# Patient Record
Sex: Male | Born: 1985
Health system: Southern US, Community
[De-identification: ages and names within clinical notes are randomized; demographics above are authoritative.]

## PROBLEM LIST (undated history)

## (undated) DIAGNOSIS — F32A Depression, unspecified: Secondary | ICD-10-CM

## (undated) DIAGNOSIS — F1910 Other psychoactive substance abuse, uncomplicated: Secondary | ICD-10-CM

## (undated) DIAGNOSIS — J45909 Unspecified asthma, uncomplicated: Secondary | ICD-10-CM

## (undated) DIAGNOSIS — Z21 Asymptomatic human immunodeficiency virus [HIV] infection status: Secondary | ICD-10-CM

## (undated) DIAGNOSIS — F988 Other specified behavioral and emotional disorders with onset usually occurring in childhood and adolescence: Secondary | ICD-10-CM

## (undated) DIAGNOSIS — F419 Anxiety disorder, unspecified: Secondary | ICD-10-CM

## (undated) DIAGNOSIS — T50902A Poisoning by unspecified drugs, medicaments and biological substances, intentional self-harm, initial encounter: Secondary | ICD-10-CM

## (undated) DIAGNOSIS — R569 Unspecified convulsions: Secondary | ICD-10-CM

## (undated) DIAGNOSIS — F329 Major depressive disorder, single episode, unspecified: Secondary | ICD-10-CM

## (undated) DIAGNOSIS — G4700 Insomnia, unspecified: Secondary | ICD-10-CM

## (undated) DIAGNOSIS — B029 Zoster without complications: Secondary | ICD-10-CM

## (undated) DIAGNOSIS — F319 Bipolar disorder, unspecified: Secondary | ICD-10-CM

## (undated) DIAGNOSIS — B20 Human immunodeficiency virus [HIV] disease: Secondary | ICD-10-CM

## (undated) DIAGNOSIS — I10 Essential (primary) hypertension: Secondary | ICD-10-CM

## (undated) DIAGNOSIS — F209 Schizophrenia, unspecified: Secondary | ICD-10-CM

## (undated) HISTORY — PX: DENTAL SURGERY: SHX609

---
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
## 2004-01-17 ENCOUNTER — Ambulatory Visit (HOSPITAL_COMMUNITY): Admission: RE | Admit: 2004-01-17 | Discharge: 2004-01-17 | Payer: Self-pay | Admitting: Internal Medicine

## 2004-06-06 ENCOUNTER — Emergency Department (HOSPITAL_COMMUNITY): Admission: EM | Admit: 2004-06-06 | Discharge: 2004-06-06 | Payer: Self-pay | Admitting: Emergency Medicine

## 2004-07-27 ENCOUNTER — Emergency Department (HOSPITAL_COMMUNITY): Admission: EM | Admit: 2004-07-27 | Discharge: 2004-07-27 | Payer: Self-pay | Admitting: Emergency Medicine

## 2005-09-07 ENCOUNTER — Emergency Department (HOSPITAL_COMMUNITY): Admission: EM | Admit: 2005-09-07 | Discharge: 2005-09-07 | Payer: Self-pay | Admitting: Emergency Medicine

## 2005-12-11 ENCOUNTER — Emergency Department (HOSPITAL_COMMUNITY): Admission: EM | Admit: 2005-12-11 | Discharge: 2005-12-11 | Payer: Self-pay | Admitting: Emergency Medicine

## 2006-01-26 ENCOUNTER — Emergency Department (HOSPITAL_COMMUNITY): Admission: EM | Admit: 2006-01-26 | Discharge: 2006-01-26 | Payer: Self-pay | Admitting: Emergency Medicine

## 2006-03-23 ENCOUNTER — Emergency Department: Payer: Self-pay | Admitting: Emergency Medicine

## 2006-03-23 ENCOUNTER — Other Ambulatory Visit: Payer: Self-pay

## 2006-05-07 ENCOUNTER — Emergency Department (HOSPITAL_COMMUNITY): Admission: EM | Admit: 2006-05-07 | Discharge: 2006-05-07 | Payer: Self-pay | Admitting: Emergency Medicine

## 2006-07-05 ENCOUNTER — Emergency Department (HOSPITAL_COMMUNITY): Admission: EM | Admit: 2006-07-05 | Discharge: 2006-07-06 | Payer: Self-pay | Admitting: Emergency Medicine

## 2006-07-26 ENCOUNTER — Emergency Department (HOSPITAL_COMMUNITY): Admission: EM | Admit: 2006-07-26 | Discharge: 2006-07-27 | Payer: Self-pay | Admitting: Emergency Medicine

## 2006-12-05 ENCOUNTER — Emergency Department: Payer: Self-pay | Admitting: Emergency Medicine

## 2006-12-21 ENCOUNTER — Emergency Department: Payer: Self-pay | Admitting: Emergency Medicine

## 2007-03-02 ENCOUNTER — Emergency Department (HOSPITAL_COMMUNITY): Admission: EM | Admit: 2007-03-02 | Discharge: 2007-03-02 | Payer: Self-pay | Admitting: Emergency Medicine

## 2007-03-31 ENCOUNTER — Emergency Department (HOSPITAL_COMMUNITY): Admission: EM | Admit: 2007-03-31 | Discharge: 2007-03-31 | Payer: Self-pay | Admitting: Emergency Medicine

## 2007-04-19 ENCOUNTER — Emergency Department (HOSPITAL_COMMUNITY): Admission: EM | Admit: 2007-04-19 | Discharge: 2007-04-19 | Payer: Self-pay | Admitting: Emergency Medicine

## 2007-04-19 ENCOUNTER — Inpatient Hospital Stay (HOSPITAL_COMMUNITY): Admission: AD | Admit: 2007-04-19 | Discharge: 2007-04-28 | Payer: Self-pay | Admitting: Psychiatry

## 2007-04-19 ENCOUNTER — Ambulatory Visit: Payer: Self-pay | Admitting: Psychiatry

## 2007-05-05 ENCOUNTER — Ambulatory Visit: Payer: Self-pay | Admitting: Internal Medicine

## 2007-05-05 ENCOUNTER — Encounter: Admission: RE | Admit: 2007-05-05 | Discharge: 2007-05-05 | Payer: Self-pay | Admitting: Internal Medicine

## 2007-05-05 DIAGNOSIS — B20 Human immunodeficiency virus [HIV] disease: Secondary | ICD-10-CM

## 2007-05-05 LAB — CONVERTED CEMR LAB
ALT: 25 units/L (ref 0–53)
AST: 23 units/L (ref 0–37)
Albumin: 4.4 g/dL (ref 3.5–5.2)
Alkaline Phosphatase: 64 units/L (ref 39–117)
BUN: 10 mg/dL (ref 6–23)
Basophils Absolute: 0.1 10*3/uL (ref 0.0–0.1)
Basophils Relative: 1 % (ref 0–1)
Bilirubin Urine: NEGATIVE
CD4 T Cell Abs: 660
CO2: 26 meq/L (ref 19–32)
Calcium: 9.4 mg/dL (ref 8.4–10.5)
Chlamydia, Swab/Urine, PCR: NEGATIVE
Chloride: 107 meq/L (ref 96–112)
Creatinine, Ser: 0.89 mg/dL (ref 0.40–1.50)
Eosinophils Absolute: 0.7 10*3/uL (ref 0.0–0.7)
Eosinophils Relative: 10 % — ABNORMAL HIGH (ref 0–5)
GC Probe Amp, Urine: NEGATIVE
Glucose, Bld: 86 mg/dL (ref 70–99)
HCT: 44.3 % (ref 39.0–52.0)
HCV Ab: NEGATIVE
HIV 1 RNA Quant: 40900 copies/mL
HIV 1 RNA Quant: 56000 copies/mL — ABNORMAL HIGH (ref <50)
HIV-1 RNA Quant, Log: 4.75 — ABNORMAL HIGH (ref <1.70)
HIV-1 antibody: POSITIVE — AB
HIV-2 Ab: UNDETERMINED — AB
HIV: REACTIVE
Hemoglobin, Urine: NEGATIVE
Hemoglobin: 14.7 g/dL (ref 13.0–17.0)
Hep A Total Ab: NEGATIVE
Hep B S Ab: NEGATIVE
Hepatitis B Surface Ag: NEGATIVE
Ketones, ur: NEGATIVE mg/dL
Leukocytes, UA: NEGATIVE
Lymphocytes Relative: 33 % (ref 12–46)
Lymphs Abs: 2.2 10*3/uL (ref 0.7–3.3)
MCHC: 33.2 g/dL (ref 30.0–36.0)
MCV: 85.9 fL (ref 78.0–100.0)
Monocytes Absolute: 0.6 10*3/uL (ref 0.2–0.7)
Monocytes Relative: 9 % (ref 3–11)
Neutro Abs: 3 10*3/uL (ref 1.7–7.7)
Neutrophils Relative %: 47 % (ref 43–77)
Nitrite: NEGATIVE
Platelets: 237 10*3/uL (ref 150–400)
Potassium: 4.5 meq/L (ref 3.5–5.3)
Protein, ur: NEGATIVE mg/dL
RBC: 5.16 M/uL (ref 4.22–5.81)
RDW: 13.7 % (ref 11.5–14.0)
Sodium: 142 meq/L (ref 135–145)
Specific Gravity, Urine: 1.026 (ref 1.005–1.03)
Total Bilirubin: 0.5 mg/dL (ref 0.3–1.2)
Total Protein: 7.1 g/dL (ref 6.0–8.3)
Urine Glucose: NEGATIVE mg/dL
Urobilinogen, UA: 0.2 (ref 0.0–1.0)
WBC: 6.5 10*3/uL (ref 4.0–10.5)
pH: 7.5 (ref 5.0–8.0)

## 2007-05-06 ENCOUNTER — Encounter: Payer: Self-pay | Admitting: Internal Medicine

## 2007-05-10 ENCOUNTER — Encounter: Payer: Self-pay | Admitting: Internal Medicine

## 2007-05-20 ENCOUNTER — Ambulatory Visit: Payer: Self-pay | Admitting: Internal Medicine

## 2007-05-20 DIAGNOSIS — J45909 Unspecified asthma, uncomplicated: Secondary | ICD-10-CM | POA: Insufficient documentation

## 2007-05-23 ENCOUNTER — Encounter (INDEPENDENT_AMBULATORY_CARE_PROVIDER_SITE_OTHER): Payer: Self-pay | Admitting: *Deleted

## 2007-05-25 ENCOUNTER — Encounter: Payer: Self-pay | Admitting: Internal Medicine

## 2007-09-01 ENCOUNTER — Encounter (INDEPENDENT_AMBULATORY_CARE_PROVIDER_SITE_OTHER): Payer: Self-pay | Admitting: *Deleted

## 2007-09-19 ENCOUNTER — Encounter: Payer: Self-pay | Admitting: Internal Medicine

## 2007-09-28 ENCOUNTER — Encounter (INDEPENDENT_AMBULATORY_CARE_PROVIDER_SITE_OTHER): Payer: Self-pay | Admitting: *Deleted

## 2008-05-21 ENCOUNTER — Emergency Department (HOSPITAL_COMMUNITY): Admission: EM | Admit: 2008-05-21 | Discharge: 2008-05-21 | Payer: Self-pay | Admitting: Emergency Medicine

## 2011-02-10 NOTE — Discharge Summary (Signed)
Edwin Martinez, Edwin Martinez              ACCOUNT NO.:  [id number]   MEDICAL RECORD NO.:  [id number]          PATIENT TYPE:  IPS   LOCATION:  0506                          FACILITY:  [facility]   PHYSICIAN:  Geoffery Lyons, M.D.      DATE OF BIRTH:  1986-01-11   DATE OF ADMISSION:  04/19/2007  DATE OF DISCHARGE:  04/28/2007                               DISCHARGE SUMMARY   CHIEF COMPLAINT AND PRESENT ILLNESS:  This was the second admission to  [facility] for this 25 year old single African-  American male voluntarily admitted.  History of depression, suicide  ideas, wanted to hang himself, positive for voices.  He cut his left arm  with a razor.  Drinking socially.  Stressors include he is homeless and  issues in relationship.   PAST PSYCHIATRIC HISTORY:  Second time at [facility].  No current  outpatient treatment.  Had been previously diagnosed as bipolar and  ADHD.   ALCOHOL/DRUG HISTORY:  Denies active use of any substances.   MEDICAL HISTORY:  Asthma.   MEDICATIONS:  Had been on Seroquel which was effective, none for a year.   PHYSICAL EXAMINATION:  Performed and failed to show any acute findings.   LABORATORY DATA:  SGOT 23, SGPT 22, total bilirubin 0.7.   MENTAL STATUS EXAM:  Fully alert, cooperative male, good eye contact.  Speech clear, normal rate, tempo and production, articulate.  Mood  depressed, sad, hopeless.  Affect depressed.  Thought processes logical,  coherent and relevant.  Endorsed voices that talk to him.  Endorsed no  active suicidal or homicidal ideation.  There is no evidence of  delusions.  No hallucinations.  Cognition well-preserved.   ADMISSION DIAGNOSES:  AXIS I:  Major depression with psychotic features.  Marijuana abuse.  AXIS II:  No diagnosis.  AXIS III:  Asthma.  AXIS IV:  Moderate.  AXIS V:  GAF upon admission 35; highest GAF in the last year 60.   HOSPITAL COURSE:  He was admitted.  He was started in individual and  group  psychotherapy.  He endorsed that he freaked out, started hearing a  voice telling him to hurt himself, got a knife and cut self on the arm.  Mother called the police who took him to the ED and then he was referred  to [facility].  Mood depressed for two or three days, suicidal  ideas, hallucinations telling him to hurt himself.  Was taking his  mother's Seroquel to help him calm down.  No ongoing treatment.  He  indeed had some superficial scratches on left outer aspect of his arm.  No medications since 2005.  He had been on Advair, Seroquel 300 mg twice  a day, Prevacid 30 mg per day.  When he turned 20, he stopped his  medications secondary to no money.  Previous admissions to [facility]  adolescent for attempting to hang himself.  He was living with older  male who is his friend who has a 55 year old son.  They apparently  smoke marijuana together.  He was kicked out of the house for talking  about marijuana with her son.  Previous HIV testing turned out to be  positive.  He initially did not know.  Endorsed that he became suicidal  after the friend put him out of the house.  No transportation,  unemployed.  We went ahead and notified him about his HIV test being  positive.  He got very upset and he actually fainted when he heard the  news.  Endorsed hearing a voice telling him to do things like hurting  himself.  Became very upset, very tearful, talking about telling his  mother about being HIV positive was catastrophizing.  Endorsed I'm  going to die.  It's all over.  We provided one-on-one observation to  try to help him process the news.  As the day progressed, he was able to  endorse that he was not feeling suicidal anymore.  He became more  proactive, trying to find out more about the HIV and the treatment and  the testing.  On April 22, 2007, he was able to contract for safety.  Able to come off one-on-one.  Was able to quit catastrophizing.  He was  going to stay with his  cousin.  Endorsed he was feeling much better and  affect was brighter.  Endorsed that he hears the voices more so when he  is depressed.  The next couple of days, we pursued more evaluation for  hepatitis, RPR and all these tests were negative.  He was referred to  [facility] and we made the necessary arrangements for a  medical follow-up.  The cousin that was going to allow him a place to  stay eventually changed his mind, so we start working on assisted-living  facility.  The sister was also offering a place but that again did not  materialize.  Did endorse once he started thinking about his HIV  situation, he was getting more anxious but endorsed that he was not  feeling suicidal.  We worked with the Seroquel.  We worked with Materials engineer.  He was accepted to an assisted-living facility.  He was very  excited about it.  He was going to go to the [facility].  Was going to follow up with [facility].  He had an ear skin  lesion that was being treated with doxycycline.  So it was felt that he  had obtained full benefit from the hospitalization for what we went  ahead and discharged to outpatient follow-up.   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  Marijuana abuse.  AXIS II:  No diagnosis.  AXIS III:  HIV-positive, asthma.  AXIS IV:  Moderate.  AXIS V:  GAF upon discharge 50.   DISCHARGE MEDICATIONS:  1. Seroquel 200 mg at bedtime and 100 mg twice a day.  2. Naproxen 500 mg, 1 twice a day as needed.  3. Doxycycline 100 mg, 1 at 6 p.m.   FOLLOWUP:  Outpatient clinic and Dr. Tresa Endo __________ and the [facility].      Geoffery Lyons, M.D.  Electronically Signed     IL/MEDQ  D:  05/10/2007  T:  05/11/2007  Job:  [id number]
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
## 2011-02-13 NOTE — Discharge Summary (Signed)
NAME:  Edwin Martinez, Edwin Martinez                        ACCOUNT NO.:  [id number]   MEDICAL RECORD NO.:  [id number]                   PATIENT TYPE:  INP   LOCATION:  0201                                 FACILITY:  [facility]   PHYSICIAN:  Cindie Crumbly, M.D.               DATE OF BIRTH:  10-11-1985   DATE OF ADMISSION:  10/07/2002  DATE OF DISCHARGE:  10/10/2002                                 DISCHARGE SUMMARY   REASON FOR ADMISSION:  This 25 year old white male was admitted for  inpatient psychiatric stabilization because of increasing suicidal ideation  with increasingly deep cuts made upon his wrists.  For further history of  present illness, please see the patient's psychiatric admission assessment.   PHYSICAL EXAMINATION:  At the time of admission was significant for a  history of asthma.  He otherwise displayed superficial self-inflicted  lacerations of his left forearm.  Physical examination was otherwise  negative.   LABORATORY DATA:  The patient underwent a laboratory workup to rule out any  other medical problems contributing to his symptomatology.  Urine probe for  gonorrhea and chlamydia was negative.  RPR was nonreactive.  UA was  unremarkable.  GGT was within normal limits.  Hepatic panel was within  normal limits.  Free T4 and TSH were within normal limits.  All further  laboratory evaluations were performed at [facility] prior to the  patient being transferred to this facility for a more definitive  stabilization.   HOSPITAL COURSE:  On admission, the patient was psychomotor agitated, showed  poor impulse control, decreased concentration.  His affect and mood were  depressed.  He was intrusive and psychomotor agitated.  He was begun on a  trial of Celexa at 20 mg per day and tolerated this medication well without  side effects.  At the time of discharge, he denies any suicidal or homicidal  ideation.  His affect and mood have improved.  His concentration has  increased.  He no longer appears to be a danger to himself or others and,  consequently, is felt to have reached his maximum benefits of  hospitalization and is ready for discharge to a less restrictive alternative  setting.   CONDITION ON DISCHARGE:  Improved.   DIAGNOSES (ACCORDING TO DSM-IV):   AXIS I:  1. Major depression, recurrent, severe, without psychosis.  2. Rule out substance-induced mood disorder.  3. Polysubstance dependence.  4. 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  5. Oppositional defiant disorder.   AXIS II:  1. Rule out learning disorder not otherwise specified.  2. Rule out personality disorder not otherwise specified.   AXIS III:  Asthma.   AXIS IV:  Severe.   AXIS V:  20 on admission; 30 on discharge.   FURTHER EVALUATION AND TREATMENT RECOMMENDATIONS:  1. The patient is discharged to home.  2. He is discharged on an unrestricted level of activity and a regular diet.  3. He is  discharged on Remeron 30 mg p.o. q.h.s.  4. He will follow up with his outpatient psychiatrist at [facility] for all further aspects of his psychiatric care and,     consequently, I will sign off on the case at this time.  He will follow     up with his primary care physician for all further aspects of his medical     care.                                               Cindie Crumbly, M.D.    TS/MEDQ  D:  10/10/2002  T:  10/10/2002  Job:  [id number]

## 2011-03-25 LAB — HIV RNA, QUANTITATIVE, PCR: HIV 1 RNA Quant: <20

## 2011-03-25 LAB — T-HELPER CELL (CD4) - (RCID CLINIC ONLY): CD4 absolute: 1421

## 2011-07-13 LAB — T-HELPER CELL (CD4) - (RCID CLINIC ONLY)
CD4 % Helper T Cell: 27 — ABNORMAL LOW
CD4 T Cell Abs: 520

## 2011-07-13 LAB — ETHANOL: Alcohol, Ethyl (B): <5

## 2011-07-13 LAB — CBC
HCT: 42.1
Hemoglobin: 14.2
MCHC: 33.8
MCV: 85
Platelets: 228
RBC: 4.95
RDW: 14.1 — ABNORMAL HIGH
WBC: 7.7

## 2011-07-13 LAB — RAPID URINE DRUG SCREEN, HOSP PERFORMED
Amphetamines: NOT DETECTED
Barbiturates: NOT DETECTED
Benzodiazepines: NOT DETECTED
Cocaine: NOT DETECTED
Opiates: NOT DETECTED
Tetrahydrocannabinol: POSITIVE — AB

## 2011-07-13 LAB — HEPATIC FUNCTION PANEL
ALT: 22
AST: 23
Albumin: 3.9
Alkaline Phosphatase: 75
Bilirubin, Direct: <0.1
Total Bilirubin: 0.7
Total Protein: 7

## 2011-07-13 LAB — BASIC METABOLIC PANEL
BUN: 10
CO2: 25
Calcium: 9.4
Chloride: 107
Creatinine, Ser: 0.87
GFR calc Af Amer: >60
GFR calc non Af Amer: >60
Glucose, Bld: 100 — ABNORMAL HIGH
Potassium: 3.9
Sodium: 139

## 2011-07-13 LAB — EPSTEIN-BARR VIRUS VCA, IGM: EBV VCA IgM: 0.17

## 2011-07-13 LAB — HIV-1 GENOTYPR PLUS

## 2011-07-13 LAB — T-HELPER CELLS (CD4) COUNT (NOT AT ARMC): CD4 % Helper T Cell: 28 — ABNORMAL LOW

## 2011-07-13 LAB — CMV IGM: CMV IgM: <0.9 Index (ref <0.90)

## 2011-07-13 LAB — DIFFERENTIAL
Basophils Absolute: 0.1
Basophils Relative: 1
Eosinophils Absolute: 0.4
Eosinophils Relative: 5
Lymphocytes Relative: 32
Lymphs Abs: 2.4
Monocytes Absolute: 0.8 — ABNORMAL HIGH
Monocytes Relative: 10
Neutro Abs: 4
Neutrophils Relative %: 52

## 2011-07-13 LAB — HEPATITIS B SURFACE ANTIGEN: Hepatitis B Surface Ag: NEGATIVE

## 2011-07-13 LAB — HEPATITIS C ANTIBODY: HCV Ab: NEGATIVE

## 2011-07-13 LAB — EPSTEIN-BARR VIRUS VCA, IGG: EBV VCA IgG: 4.69 — ABNORMAL HIGH

## 2011-07-13 LAB — HEPATITIS B SURFACE ANTIBODY,QUALITATIVE: Hep B S Ab: NEGATIVE

## 2011-07-13 LAB — HEPATITIS B CORE ANTIBODY, TOTAL: Hep B Core Total Ab: NEGATIVE

## 2011-07-13 LAB — RPR: RPR Ser Ql: NONREACTIVE

## 2011-07-13 LAB — HIV-1 RNA ULTRAQUANT REFLEX TO GENTYP+
HIV 1 RNA Quant: 40900 copies/mL — ABNORMAL HIGH (ref <50)
HIV-1 RNA Quant, Log: 4.61 — ABNORMAL HIGH (ref <1.70)

## 2011-07-14 LAB — HIV ANTIBODY (ROUTINE TESTING W REFLEX): HIV: REACTIVE — AB

## 2012-09-06 ENCOUNTER — Ambulatory Visit (INDEPENDENT_AMBULATORY_CARE_PROVIDER_SITE_OTHER): Payer: Self-pay

## 2012-09-06 ENCOUNTER — Ambulatory Visit: Payer: Self-pay

## 2012-09-06 ENCOUNTER — Other Ambulatory Visit: Payer: Self-pay | Admitting: Internal Medicine

## 2012-09-06 DIAGNOSIS — B20 Human immunodeficiency virus [HIV] disease: Secondary | ICD-10-CM

## 2012-09-06 DIAGNOSIS — Z23 Encounter for immunization: Secondary | ICD-10-CM

## 2012-09-06 DIAGNOSIS — F909 Attention-deficit hyperactivity disorder, unspecified type: Secondary | ICD-10-CM

## 2012-09-06 DIAGNOSIS — Z113 Encounter for screening for infections with a predominantly sexual mode of transmission: Secondary | ICD-10-CM

## 2012-09-06 DIAGNOSIS — Z79899 Other long term (current) drug therapy: Secondary | ICD-10-CM

## 2012-09-06 DIAGNOSIS — Z111 Encounter for screening for respiratory tuberculosis: Secondary | ICD-10-CM

## 2012-09-06 LAB — COMPLETE METABOLIC PANEL WITH GFR
AST: 15 U/L (ref 0–37)
BUN: 8 mg/dL (ref 6–23)
Calcium: 8.9 mg/dL (ref 8.4–10.5)
Chloride: 107 mEq/L (ref 96–112)
Creat: 0.97 mg/dL (ref 0.50–1.35)
Total Bilirubin: 0.4 mg/dL (ref 0.3–1.2)

## 2012-09-06 LAB — CBC WITH DIFFERENTIAL/PLATELET
Basophils Absolute: 0 10*3/uL (ref 0.0–0.1)
Basophils Relative: 0 % (ref 0–1)
Eosinophils Absolute: 0.3 10*3/uL (ref 0.0–0.7)
Eosinophils Relative: 3 % (ref 0–5)
HCT: 43.2 % (ref 39.0–52.0)
Lymphocytes Relative: 24 % (ref 12–46)
MCH: 28.6 pg (ref 26.0–34.0)
MCHC: 34.3 g/dL (ref 30.0–36.0)
MCV: 83.6 fL (ref 78.0–100.0)
Monocytes Absolute: 0.5 10*3/uL (ref 0.1–1.0)
Platelets: 323 10*3/uL (ref 150–400)
RDW: 13.8 % (ref 11.5–15.5)

## 2012-09-06 LAB — LIPID PANEL
HDL: 46 mg/dL (ref >39)
LDL Cholesterol: 70 mg/dL (ref 0–99)
Triglycerides: 69 mg/dL (ref <150)
VLDL: 14 mg/dL (ref 0–40)

## 2012-09-06 LAB — HEPATITIS C ANTIBODY: HCV Ab: NEGATIVE

## 2012-09-06 LAB — HEPATITIS B SURFACE ANTIGEN: Hepatitis B Surface Ag: NEGATIVE

## 2012-09-06 LAB — RPR

## 2012-09-06 LAB — HEPATITIS B SURFACE ANTIBODY,QUALITATIVE: Hep B S Ab: REACTIVE — AB

## 2012-09-07 LAB — URINALYSIS
Bilirubin Urine: NEGATIVE
Ketones, ur: NEGATIVE mg/dL
Specific Gravity, Urine: 1.019 (ref 1.005–1.030)
Urobilinogen, UA: 1 mg/dL (ref 0.0–1.0)
pH: 6 (ref 5.0–8.0)

## 2012-09-13 LAB — HIV-1 GENOTYPR PLUS

## 2012-09-14 NOTE — Progress Notes (Signed)
Pt has been off HIV medications for at least one year.  He was taking med daily during incarceration for 16 months starting 2011.

## 2012-09-23 ENCOUNTER — Encounter (HOSPITAL_COMMUNITY): Payer: Self-pay | Admitting: *Deleted

## 2012-09-23 ENCOUNTER — Emergency Department (HOSPITAL_COMMUNITY): Payer: Self-pay

## 2012-09-23 ENCOUNTER — Emergency Department (HOSPITAL_COMMUNITY)
Admission: EM | Admit: 2012-09-23 | Discharge: 2012-09-23 | Disposition: A | Discharge status: Home / Self Care | Payer: Self-pay | Attending: Emergency Medicine | Admitting: Emergency Medicine

## 2012-09-23 DIAGNOSIS — F17200 Nicotine dependence, unspecified, uncomplicated: Secondary | ICD-10-CM | POA: Insufficient documentation

## 2012-09-23 DIAGNOSIS — S0083XA Contusion of other part of head, initial encounter: Secondary | ICD-10-CM

## 2012-09-23 DIAGNOSIS — S21209A Unspecified open wound of unspecified back wall of thorax without penetration into thoracic cavity, initial encounter: Secondary | ICD-10-CM | POA: Insufficient documentation

## 2012-09-23 DIAGNOSIS — S1093XA Contusion of unspecified part of neck, initial encounter: Secondary | ICD-10-CM | POA: Insufficient documentation

## 2012-09-23 DIAGNOSIS — F319 Bipolar disorder, unspecified: Secondary | ICD-10-CM | POA: Insufficient documentation

## 2012-09-23 DIAGNOSIS — W503XXA Accidental bite by another person, initial encounter: Secondary | ICD-10-CM

## 2012-09-23 DIAGNOSIS — F1910 Other psychoactive substance abuse, uncomplicated: Secondary | ICD-10-CM | POA: Insufficient documentation

## 2012-09-23 DIAGNOSIS — S0003XA Contusion of scalp, initial encounter: Secondary | ICD-10-CM | POA: Insufficient documentation

## 2012-09-23 DIAGNOSIS — Z8659 Personal history of other mental and behavioral disorders: Secondary | ICD-10-CM | POA: Insufficient documentation

## 2012-09-23 DIAGNOSIS — B20 Human immunodeficiency virus [HIV] disease: Secondary | ICD-10-CM | POA: Insufficient documentation

## 2012-09-23 DIAGNOSIS — IMO0002 Reserved for concepts with insufficient information to code with codable children: Secondary | ICD-10-CM | POA: Insufficient documentation

## 2012-09-23 HISTORY — DX: Schizophrenia, unspecified: F20.9

## 2012-09-23 HISTORY — DX: Asymptomatic human immunodeficiency virus (hiv) infection status: Z21

## 2012-09-23 HISTORY — DX: Human immunodeficiency virus (HIV) disease: B20

## 2012-09-23 HISTORY — DX: Bipolar disorder, unspecified: F31.9

## 2012-09-23 LAB — ETHANOL: Alcohol, Ethyl (B): 108 mg/dL — ABNORMAL HIGH (ref 0–11)

## 2012-09-23 LAB — RAPID URINE DRUG SCREEN, HOSP PERFORMED: Opiates: NOT DETECTED

## 2012-09-23 MED ORDER — AMOXICILLIN-POT CLAVULANATE 500-125 MG PO TABS: 1.0000 | ORAL_TABLET | Freq: Three times a day (TID) | ORAL | Status: DC

## 2012-09-23 NOTE — ED Notes (Addendum)
[facility] aware and in ED at this time
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
## 2012-09-23 NOTE — ED Notes (Signed)
Pt awoken to give [place] a urine sample. Pt able to. Went back to sleep after.
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
## 2012-09-29 ENCOUNTER — Ambulatory Visit: Payer: Self-pay

## 2012-09-29 ENCOUNTER — Ambulatory Visit: Payer: Self-pay | Admitting: Internal Medicine

## 2013-01-02 ENCOUNTER — Encounter (HOSPITAL_COMMUNITY): Payer: Self-pay | Admitting: *Deleted

## 2013-01-02 ENCOUNTER — Emergency Department (HOSPITAL_COMMUNITY)
Admission: EM | Admit: 2013-01-02 | Discharge: 2013-01-02 | Disposition: A | Discharge status: Home / Self Care | Payer: Self-pay | Attending: Emergency Medicine | Admitting: Emergency Medicine

## 2013-01-02 DIAGNOSIS — F17200 Nicotine dependence, unspecified, uncomplicated: Secondary | ICD-10-CM | POA: Insufficient documentation

## 2013-01-02 DIAGNOSIS — Z79899 Other long term (current) drug therapy: Secondary | ICD-10-CM | POA: Insufficient documentation

## 2013-01-02 DIAGNOSIS — S01501A Unspecified open wound of lip, initial encounter: Secondary | ICD-10-CM | POA: Insufficient documentation

## 2013-01-02 DIAGNOSIS — F319 Bipolar disorder, unspecified: Secondary | ICD-10-CM | POA: Insufficient documentation

## 2013-01-02 DIAGNOSIS — F209 Schizophrenia, unspecified: Secondary | ICD-10-CM | POA: Insufficient documentation

## 2013-01-02 DIAGNOSIS — Z21 Asymptomatic human immunodeficiency virus [HIV] infection status: Secondary | ICD-10-CM | POA: Insufficient documentation

## 2013-01-02 DIAGNOSIS — IMO0002 Reserved for concepts with insufficient information to code with codable children: Secondary | ICD-10-CM

## 2013-01-02 MED ORDER — ACETAMINOPHEN 500 MG PO TABS
1000.0000 mg | ORAL_TABLET | Freq: Once | ORAL | Status: AC
  Administered 2013-01-02: 1000 mg via ORAL
  Filled 2013-01-02: qty 2
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
## 2013-01-04 ENCOUNTER — Other Ambulatory Visit: Payer: Self-pay | Admitting: *Deleted

## 2013-01-04 DIAGNOSIS — B20 Human immunodeficiency virus [HIV] disease: Secondary | ICD-10-CM

## 2013-01-05 ENCOUNTER — Other Ambulatory Visit: Payer: Self-pay | Admitting: Internal Medicine

## 2013-01-05 ENCOUNTER — Other Ambulatory Visit (INDEPENDENT_AMBULATORY_CARE_PROVIDER_SITE_OTHER): Payer: Self-pay

## 2013-01-05 DIAGNOSIS — B20 Human immunodeficiency virus [HIV] disease: Secondary | ICD-10-CM

## 2013-01-05 LAB — COMPLETE METABOLIC PANEL WITH GFR
AST: 22 U/L (ref 0–37)
Albumin: 3.9 g/dL (ref 3.5–5.2)
Alkaline Phosphatase: 72 U/L (ref 39–117)
BUN: 10 mg/dL (ref 6–23)
Creat: 0.75 mg/dL (ref 0.50–1.35)
GFR, Est Non African American: >89 mL/min
Glucose, Bld: 85 mg/dL (ref 70–99)
Potassium: 4 mEq/L (ref 3.5–5.3)
Total Bilirubin: 0.3 mg/dL (ref 0.3–1.2)

## 2013-01-06 LAB — CBC WITH DIFFERENTIAL/PLATELET
Basophils Absolute: 0 10*3/uL (ref 0.0–0.1)
Basophils Relative: 0 % (ref 0–1)
Eosinophils Absolute: 0.4 10*3/uL (ref 0.0–0.7)
Eosinophils Relative: 4 % (ref 0–5)
HCT: 42.8 % (ref 39.0–52.0)
Hemoglobin: 14.4 g/dL (ref 13.0–17.0)
MCH: 28.1 pg (ref 26.0–34.0)
MCHC: 33.6 g/dL (ref 30.0–36.0)
MCV: 83.6 fL (ref 78.0–100.0)
Monocytes Absolute: 0.6 10*3/uL (ref 0.1–1.0)
Monocytes Relative: 7 % (ref 3–12)
Neutro Abs: 5.6 10*3/uL (ref 1.7–7.7)
RDW: 14.5 % (ref 11.5–15.5)

## 2013-01-06 LAB — T-HELPER CELL (CD4) - (RCID CLINIC ONLY)
CD4 % Helper T Cell: 34 % (ref 33–55)
CD4 T Cell Abs: 1010 uL (ref 400–2700)

## 2013-01-07 LAB — HIV-1 RNA QUANT-NO REFLEX-BLD: HIV-1 RNA Quant, Log: 5 {Log} — ABNORMAL HIGH (ref <1.30)

## 2013-01-19 ENCOUNTER — Ambulatory Visit (INDEPENDENT_AMBULATORY_CARE_PROVIDER_SITE_OTHER): Payer: Self-pay | Admitting: Internal Medicine

## 2013-01-19 ENCOUNTER — Encounter: Payer: Self-pay | Admitting: Internal Medicine

## 2013-01-19 ENCOUNTER — Ambulatory Visit: Payer: Self-pay

## 2013-01-19 VITALS — BP 157/76 | HR 71 | Temp 97.8°F | Wt 163.0 lb

## 2013-01-19 DIAGNOSIS — Z23 Encounter for immunization: Secondary | ICD-10-CM

## 2013-01-19 DIAGNOSIS — Z21 Asymptomatic human immunodeficiency virus [HIV] infection status: Secondary | ICD-10-CM

## 2013-01-19 DIAGNOSIS — B20 Human immunodeficiency virus [HIV] disease: Secondary | ICD-10-CM

## 2013-01-19 MED ORDER — EFAVIRENZ-EMTRICITAB-TENOFOVIR 600-200-300 MG PO TABS: 1.0000 | ORAL_TABLET | Freq: Every day | ORAL | Status: DC

## 2013-01-19 NOTE — Progress Notes (Signed)
[facility] CLIN[facility] NOTE  RFV: initiation of care into clinic Subjective:    Patient ID: Edwin Martinez, male    DOB: May 18, 1986, 27 y.o.   MRN: [id number]  HPI Edwin Martinez is a 27yo Male with HIV, Diagnosed in 03/2007, current CD 4 count of 1010(34%)/VL 100,344 (in April 2014), started Atripla shortly after diagnosis. Genotype WT in Dec 2013. Hep B immune. Also suffers from bipolar disorder. He has history of incarceration was in prison for 16 month released in oct 2012 where he was given [place] for a month after release but now has been off treatment for over a year. He was recently in jail for 48 days released feb 12 ,2014. TB skin test and CXR on 7/12 negative.  Staying in [place] with friends.   He has bridge counselor cassandra helping to get access to psych meds via [facility]. Disability paperwork for mental health issues.  RF for HIV include MSM. He has recently broken up from a relationship with a guy x  1 year.  Current Outpatient Prescriptions on File Prior to Visit  Medication Sig Dispense Refill  . busPIRone (BUSPAR) 10 MG tablet Take 10 mg by mouth 3 (three) times daily.      . divalproex (DEPAKOTE SPRINKLE) 125 MG capsule Take 500 mg by mouth 2 (two) times daily.      Marland Kitchen efavirenz-emtricitabine-tenofovir (ATRIPLA) 600-200-300 MG per tablet Take 1 tablet by mouth at bedtime.      Marland Kitchen lurasidone (LATUDA) 40 MG TABS Take 40 mg by mouth daily with breakfast.      . traZODone (DESYREL) 150 MG tablet Take 150 mg by mouth at bedtime.      Marland Kitchen amoxicillin-clavulanate (AUGMENTIN) 500-125 MG per tablet Take 1 tablet (500 mg total) by mouth every 8 (eight) hours.  21 tablet  0  . atomoxetine (STRATTERA) 10 MG capsule Take 10 mg by mouth daily.      . methylphenidate (RITALIN) 10 MG tablet Take 10 mg by mouth 2 (two) times daily.       No current facility-administered medications on file prior to visit.   Active Ambulatory Problems    Diagnosis Date Noted  . HIV DISEASE 05/05/2007  . 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 INSOMNIA, CHRONIC 05/20/2007  . CARPAL TUNNEL SYNDROME 05/20/2007  . 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 ADHD (attention deficit hyperactivity disorder) 09/12/2012   Resolved Ambulatory Problems    Diagnosis Date Noted  . No Resolved Ambulatory Problems   Past Medical History  Diagnosis Date  . HIV (human immunodeficiency virus infection)   . Bipolar 1 disorder   . Schizophrenia    History  Substance Use Topics  . Smoking status: Current Every Day Smoker -- 0.50 packs/day    Types: Cigarettes    Start date: 09/29/1991  . Smokeless tobacco: Never Used  . Alcohol Use: 40.0 oz/week    80 drink(s) per week     Comment: once week   family history includes Heart disease in his mother and Huntington's disease in his father. mother passed away. Twin brother, fraternal   Review of Systems  Constitutional: intermittent for fever, chills, diaphoresis, but same activity change, appetite change, fatigue and unexpected weight change.  HENT: Negative for congestion, sore throat, rhinorrhea, sneezing, trouble swallowing and sinus pressure.  Eyes: Negative for photophobia and visual disturbance.  Respiratory: Negative for cough, chest tightness, shortness of breath, wheezing and stridor.  Cardiovascular: Negative for chest pain, palpitations and leg  swelling.  Gastrointestinal: intermittent diarrhea now improved. Negative for nausea, vomiting, abdominal pain,  constipation, blood in stool, abdominal distention and anal bleeding.  Genitourinary: Negative for dysuria, hematuria, flank pain and difficulty urinating.  Musculoskeletal: Negative for myalgias, back pain, joint swelling, arthralgias and gait problem.  Skin: Negative for color change, pallor, rash and wound.  Neurological: Negative for dizziness, tremors, weakness and light-headedness.  Hematological: Negative for adenopathy. Does not bruise/bleed easily.   Psychiatric/Behavioral: Negative for behavioral problems, confusion, sleep disturbance, dysphoric mood, decreased concentration and agitation.       Objective:   Physical Exam BP 157/76  Pulse 71  Temp(Src) 97.8 F (36.6 C) (Oral)  Wt 163 lb (73.936 kg)  BMI 25.52 kg/m2 Physical Exam  Constitutional: He is oriented to person, place, and time. He appears well-developed and well-nourished. No distress.  HENT:  Mouth/Throat: Oropharynx is clear and moist. No oropharyngeal exudate.  Cardiovascular: Normal rate, regular rhythm and normal heart sounds. Exam reveals no gallop and no friction rub.  No murmur heard.  Pulmonary/Chest: Effort normal and breath sounds normal. No respiratory distress. He has no wheezes.  Abdominal: Soft. Bowel sounds are normal. He exhibits no distension. There is no tenderness.  Lymphadenopathy:  He has no cervical adenopathy.  Neurological: He is alert and oriented to person, place, and time.  Skin: Skin is warm and dry. No rash noted. No erythema.  Psychiatric: He has a normal mood and affect. His behavior is normal.       Assessment & Plan:  HIV = his most recent genotype in dec 2013 does not show a K103N mutation. He reports having stopped atripla 18 months ago. Currently in process of getting adap approval. App submitted. May need to change regimen if does not have appropriate viral suppression. For now we will continue with atripla since he tolerated it well.  Health maintenance = will get hep a vaccine #1 due to MSM  hiv prevention = will give condoms  Bipolar = trying to get established with [facility] with aid of bridge counselor. He is currently not on any of his prior psych meds. We will need to make sure there are no drug interactions once he starts psych meds with Atripla  rtc 5-6 wks.

## 2013-01-31 ENCOUNTER — Other Ambulatory Visit: Payer: Self-pay | Admitting: *Deleted

## 2013-01-31 DIAGNOSIS — B20 Human immunodeficiency virus [HIV] disease: Secondary | ICD-10-CM

## 2013-01-31 MED ORDER — EFAVIRENZ-EMTRICITAB-TENOFOVIR 600-200-300 MG PO TABS: 1.0000 | ORAL_TABLET | Freq: Every day | ORAL | Status: DC

## 2013-02-23 ENCOUNTER — Other Ambulatory Visit: Payer: Self-pay

## 2013-03-09 ENCOUNTER — Encounter: Payer: Self-pay | Admitting: Internal Medicine

## 2013-03-09 ENCOUNTER — Ambulatory Visit (INDEPENDENT_AMBULATORY_CARE_PROVIDER_SITE_OTHER): Payer: Self-pay | Admitting: Internal Medicine

## 2013-03-09 VITALS — BP 135/80 | HR 65 | Temp 97.5°F | Wt 167.0 lb

## 2013-03-09 DIAGNOSIS — F319 Bipolar disorder, unspecified: Secondary | ICD-10-CM

## 2013-03-09 DIAGNOSIS — B20 Human immunodeficiency virus [HIV] disease: Secondary | ICD-10-CM

## 2013-03-09 DIAGNOSIS — F1020 Alcohol dependence, uncomplicated: Secondary | ICD-10-CM

## 2013-03-09 DIAGNOSIS — Z21 Asymptomatic human immunodeficiency virus [HIV] infection status: Secondary | ICD-10-CM

## 2013-03-09 MED ORDER — EFAVIRENZ-EMTRICITAB-TENOFOVIR 600-200-300 MG PO TABS: 1.0000 | ORAL_TABLET | Freq: Every day | ORAL | Status: DC

## 2013-03-09 NOTE — Progress Notes (Signed)
[facility] CL[facility] NOTE  RFV: routine  Subjective:    Patient ID: Edwin Martinez, male    DOB: Jun 03, 1986, 27 y.o.   MRN: [id number]  HPI Edwin Martinez is a 27yo Male with HIV, Diagnosed in 03/2007, current CD 4 count of 1010(34%)/VL 100,344 (in April 2014), started Atripla shortly after diagnosis. Genotype WT in Dec 2013. Hep B immune. Also suffers from bipolar disorder. He has history of incarceration was in prison for 16 month released in oct 2012 where he was given [place] for a month after release but now has been off treatment for over a year. He was recently in jail for 48 days released feb 12 ,2014. TB skin test and CXR on 7/12 negative.  Had difficulty getting atripla since our last visit, thus not on therapy  Also had boil on his buttocks which is now resolved  Current Outpatient Prescriptions on File Prior to Visit  Medication Sig Dispense Refill  . busPIRone (BUSPAR) 10 MG tablet Take 10 mg by mouth 3 (three) times daily.      . divalproex (DEPAKOTE SPRINKLE) 125 MG capsule Take 500 mg by mouth 2 (two) times daily.      Marland Kitchen efavirenz-emtricitabine-tenofovir (ATRIPLA) 600-200-300 MG per tablet Take 1 tablet by mouth at bedtime.  30 tablet  6   No current facility-administered medications on file prior to visit.   Active Ambulatory Problems    Diagnosis Date Noted  . HIV DISEASE 05/05/2007  . 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 INSOMNIA, CHRONIC 05/20/2007  . CARPAL TUNNEL SYNDROME 05/20/2007  . 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 ADHD (attention deficit hyperactivity disorder) 09/12/2012   Resolved Ambulatory Problems    Diagnosis Date Noted  . No Resolved Ambulatory Problems   Past Medical History  Diagnosis Date  . HIV (human immunodeficiency virus infection)   . Bipolar 1 disorder   . Schizophrenia    Social: drinks 3-4 beers(large) per day, doesn't feel that he has a problem. - CAGE questionnaire. Doesn't interfere with his daily  function, reportedly  Review of Systems 12 point ROS negative    Objective:   Physical Exam BP 135/80  Pulse 65  Temp(Src) 97.5 F (36.4 C) (Oral)  Wt 167 lb (75.751 kg)  BMI 26.15 kg/m2 Physical Exam  Constitutional: He is oriented to person, place, and time. He appears well-developed and well-nourished. No distress.  HENT:  Mouth/Throat: Oropharynx is clear and moist. No oropharyngeal exudate.  Cardiovascular: Normal rate, regular rhythm and normal heart sounds. Exam reveals no gallop and no friction rub.  No murmur heard.  Pulmonary/Chest: Effort normal and breath sounds normal. No respiratory distress. He has no wheezes.  Abdominal: Soft. Bowel sounds are normal. He exhibits no distension. There is no tenderness.  Lymphadenopathy:  He has no cervical adenopathy. Neurological: He is alert and oriented to person, place, and time.  Skin: Skin is warm and dry. No rash noted. No erythema. Left axilla has a few subcutaneous nodule/boil Psychiatric: He has a normal mood and affect. His behavior is normal.          Assessment & Plan:  hiv =needs to get started back on meds, which has been clarifier with his correct address.  biopolar disease = continue on buspar and depakote. No interaction with atripla  Alcohol use = will ask again at next appt if he would like counseling  rtc in 4 wk

## 2013-04-06 ENCOUNTER — Ambulatory Visit: Payer: Self-pay | Admitting: Internal Medicine

## 2013-05-03 ENCOUNTER — Telehealth: Payer: Self-pay | Admitting: *Deleted

## 2013-05-03 NOTE — Telephone Encounter (Signed)
Called patient and his number is not in service. Was trying to schedule an appt to see the doctor.

## 2013-07-28 ENCOUNTER — Encounter (HOSPITAL_COMMUNITY): Payer: Self-pay | Admitting: Emergency Medicine

## 2013-07-28 ENCOUNTER — Emergency Department (HOSPITAL_COMMUNITY): Payer: Self-pay

## 2013-07-28 ENCOUNTER — Emergency Department (HOSPITAL_COMMUNITY)
Admission: EM | Admit: 2013-07-28 | Discharge: 2013-07-28 | Disposition: A | Discharge status: Home / Self Care | Payer: Self-pay | Attending: Emergency Medicine | Admitting: Emergency Medicine

## 2013-07-28 DIAGNOSIS — F209 Schizophrenia, unspecified: Secondary | ICD-10-CM | POA: Insufficient documentation

## 2013-07-28 DIAGNOSIS — Z21 Asymptomatic human immunodeficiency virus [HIV] infection status: Secondary | ICD-10-CM | POA: Insufficient documentation

## 2013-07-28 DIAGNOSIS — Z79899 Other long term (current) drug therapy: Secondary | ICD-10-CM | POA: Insufficient documentation

## 2013-07-28 DIAGNOSIS — S61409A Unspecified open wound of unspecified hand, initial encounter: Secondary | ICD-10-CM | POA: Insufficient documentation

## 2013-07-28 DIAGNOSIS — S0510XA Contusion of eyeball and orbital tissues, unspecified eye, initial encounter: Secondary | ICD-10-CM | POA: Insufficient documentation

## 2013-07-28 DIAGNOSIS — S61412A Laceration without foreign body of left hand, initial encounter: Secondary | ICD-10-CM

## 2013-07-28 DIAGNOSIS — S0180XA Unspecified open wound of other part of head, initial encounter: Secondary | ICD-10-CM | POA: Insufficient documentation

## 2013-07-28 DIAGNOSIS — S0592XA Unspecified injury of left eye and orbit, initial encounter: Secondary | ICD-10-CM

## 2013-07-28 DIAGNOSIS — S0100XA Unspecified open wound of scalp, initial encounter: Secondary | ICD-10-CM | POA: Insufficient documentation

## 2013-07-28 DIAGNOSIS — S0101XA Laceration without foreign body of scalp, initial encounter: Secondary | ICD-10-CM

## 2013-07-28 DIAGNOSIS — F17200 Nicotine dependence, unspecified, uncomplicated: Secondary | ICD-10-CM | POA: Insufficient documentation

## 2013-07-28 DIAGNOSIS — S0181XA Laceration without foreign body of other part of head, initial encounter: Secondary | ICD-10-CM

## 2013-07-28 DIAGNOSIS — Z880 Allergy status to penicillin: Secondary | ICD-10-CM | POA: Insufficient documentation

## 2013-07-28 DIAGNOSIS — F319 Bipolar disorder, unspecified: Secondary | ICD-10-CM | POA: Insufficient documentation

## 2013-07-28 LAB — CBC WITH DIFFERENTIAL/PLATELET
Basophils Absolute: 0.1 10*3/uL (ref 0.0–0.1)
Basophils Relative: 0 % (ref 0–1)
Hemoglobin: 14.9 g/dL (ref 13.0–17.0)
Lymphocytes Relative: 29 % (ref 12–46)
MCHC: 35.1 g/dL (ref 30.0–36.0)
Neutro Abs: 7.6 10*3/uL (ref 1.7–7.7)
Neutrophils Relative %: 63 % (ref 43–77)
Platelets: 237 10*3/uL (ref 150–400)
RDW: 13.6 % (ref 11.5–15.5)
WBC: 12.1 10*3/uL — ABNORMAL HIGH (ref 4.0–10.5)

## 2013-07-28 LAB — ETHANOL: Alcohol, Ethyl (B): 144 mg/dL — ABNORMAL HIGH (ref 0–11)

## 2013-07-28 LAB — BASIC METABOLIC PANEL
CO2: 22 mEq/L (ref 19–32)
Chloride: 107 mEq/L (ref 96–112)
GFR calc Af Amer: >90 mL/min (ref >90)
Potassium: 3.5 mEq/L (ref 3.5–5.1)
Sodium: 144 mEq/L (ref 135–145)

## 2013-07-28 LAB — SAMPLE TO BLOOD BANK

## 2013-07-28 MED ORDER — HYDROMORPHONE HCL PF 1 MG/ML IJ SOLN
1.0000 mg | Freq: Once | INTRAMUSCULAR | Status: AC
  Administered 2013-07-28: 1 mg via INTRAVENOUS
  Filled 2013-07-28: qty 1

## 2013-07-28 MED ORDER — HYDROCODONE-ACETAMINOPHEN 5-325 MG PO TABS: 1.0000 | ORAL_TABLET | Freq: Four times a day (QID) | ORAL | Status: DC | PRN

## 2013-07-28 MED ORDER — MORPHINE SULFATE 4 MG/ML IJ SOLN
4.0000 mg | Freq: Once | INTRAMUSCULAR | Status: AC
  Administered 2013-07-28: 4 mg via INTRAVENOUS
  Filled 2013-07-28: qty 1

## 2013-07-28 MED ORDER — ONDANSETRON HCL 4 MG/2ML IJ SOLN
4.0000 mg | Freq: Once | INTRAMUSCULAR | Status: AC
  Administered 2013-07-28: 4 mg via INTRAMUSCULAR
  Filled 2013-07-28: qty 2
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
## 2013-07-31 NOTE — ED Provider Notes (Signed)
Medical screening examination/treatment/procedure(s) were performed by non-physician practitioner and as supervising physician I was immediately available for consultation/collaboration.   Saajan Willmon T Gilma Bessette, MD 07/31/13 2314 

## 2013-08-03 ENCOUNTER — Other Ambulatory Visit: Payer: Self-pay

## 2013-08-10 ENCOUNTER — Other Ambulatory Visit: Payer: Self-pay | Admitting: Internal Medicine

## 2013-08-10 ENCOUNTER — Other Ambulatory Visit (INDEPENDENT_AMBULATORY_CARE_PROVIDER_SITE_OTHER): Payer: Self-pay

## 2013-08-10 ENCOUNTER — Ambulatory Visit (INDEPENDENT_AMBULATORY_CARE_PROVIDER_SITE_OTHER): Payer: Self-pay

## 2013-08-10 DIAGNOSIS — Z23 Encounter for immunization: Secondary | ICD-10-CM

## 2013-08-10 DIAGNOSIS — Z79899 Other long term (current) drug therapy: Secondary | ICD-10-CM

## 2013-08-10 DIAGNOSIS — B20 Human immunodeficiency virus [HIV] disease: Secondary | ICD-10-CM

## 2013-08-10 DIAGNOSIS — Z113 Encounter for screening for infections with a predominantly sexual mode of transmission: Secondary | ICD-10-CM

## 2013-08-10 LAB — LIPID PANEL
Cholesterol: 145 mg/dL (ref 0–200)
Total CHOL/HDL Ratio: 2.6 Ratio
Triglycerides: 56 mg/dL (ref <150)

## 2013-08-10 LAB — CBC WITH DIFFERENTIAL/PLATELET
Eosinophils Absolute: 0.2 10*3/uL (ref 0.0–0.7)
Eosinophils Relative: 4 % (ref 0–5)
HCT: 44.2 % (ref 39.0–52.0)
Lymphocytes Relative: 32 % (ref 12–46)
Lymphs Abs: 1.9 10*3/uL (ref 0.7–4.0)
MCH: 29.9 pg (ref 26.0–34.0)
MCHC: 33.9 g/dL (ref 30.0–36.0)
MCV: 88.2 fL (ref 78.0–100.0)
Monocytes Absolute: 0.5 10*3/uL (ref 0.1–1.0)
Monocytes Relative: 9 % (ref 3–12)
Platelets: 280 10*3/uL (ref 150–400)
RBC: 5.01 MIL/uL (ref 4.22–5.81)
WBC: 5.7 10*3/uL (ref 4.0–10.5)

## 2013-08-10 LAB — COMPLETE METABOLIC PANEL WITH GFR
ALT: 13 U/L (ref 0–53)
AST: 16 U/L (ref 0–37)
CO2: 25 mEq/L (ref 19–32)
Calcium: 9.4 mg/dL (ref 8.4–10.5)
Chloride: 107 mEq/L (ref 96–112)
Creat: 0.93 mg/dL (ref 0.50–1.35)
Potassium: 3.8 mEq/L (ref 3.5–5.3)
Sodium: 138 mEq/L (ref 135–145)
Total Protein: 6.9 g/dL (ref 6.0–8.3)

## 2013-08-11 LAB — HIV-1 RNA QUANT-NO REFLEX-BLD
HIV 1 RNA Quant: 11753 copies/mL — ABNORMAL HIGH (ref <20)
HIV-1 RNA Quant, Log: 4.07 {Log} — ABNORMAL HIGH (ref <1.30)

## 2013-09-05 ENCOUNTER — Ambulatory Visit: Payer: Self-pay | Admitting: Internal Medicine

## 2013-09-18 ENCOUNTER — Emergency Department (HOSPITAL_COMMUNITY)
Admission: EM | Admit: 2013-09-18 | Discharge: 2013-09-19 | Disposition: A | Discharge status: Home / Self Care | Payer: Self-pay | Attending: Emergency Medicine | Admitting: Emergency Medicine

## 2013-09-18 ENCOUNTER — Encounter (HOSPITAL_COMMUNITY): Payer: Self-pay | Admitting: Emergency Medicine

## 2013-09-18 DIAGNOSIS — S0191XA Laceration without foreign body of unspecified part of head, initial encounter: Secondary | ICD-10-CM

## 2013-09-18 DIAGNOSIS — Z21 Asymptomatic human immunodeficiency virus [HIV] infection status: Secondary | ICD-10-CM | POA: Insufficient documentation

## 2013-09-18 DIAGNOSIS — S93409A Sprain of unspecified ligament of unspecified ankle, initial encounter: Secondary | ICD-10-CM | POA: Insufficient documentation

## 2013-09-18 DIAGNOSIS — F17200 Nicotine dependence, unspecified, uncomplicated: Secondary | ICD-10-CM | POA: Insufficient documentation

## 2013-09-18 DIAGNOSIS — Z880 Allergy status to penicillin: Secondary | ICD-10-CM | POA: Insufficient documentation

## 2013-09-18 DIAGNOSIS — Z8659 Personal history of other mental and behavioral disorders: Secondary | ICD-10-CM | POA: Insufficient documentation

## 2013-09-18 DIAGNOSIS — F1010 Alcohol abuse, uncomplicated: Secondary | ICD-10-CM | POA: Insufficient documentation

## 2013-09-18 DIAGNOSIS — S0100XA Unspecified open wound of scalp, initial encounter: Secondary | ICD-10-CM | POA: Insufficient documentation
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
## 2013-09-19 ENCOUNTER — Emergency Department (HOSPITAL_COMMUNITY): Payer: Self-pay

## 2013-09-19 MED ORDER — ACETAMINOPHEN 325 MG PO TABS
650.0000 mg | ORAL_TABLET | Freq: Once | ORAL | Status: AC
  Administered 2013-09-19: 650 mg via ORAL
  Filled 2013-09-19: qty 2

## 2013-09-19 NOTE — ED Notes (Signed)
Returned from xray

## 2013-09-19 NOTE — ED Notes (Signed)
Returned from CT.

## 2013-09-19 NOTE — ED Provider Notes (Signed)
Medical screening examination/treatment/procedure(s) were performed by non-physician practitioner and as supervising physician I was immediately available for consultation/collaboration.    Olivia Mackie, MD 09/19/13 [phone]
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
## 2013-09-19 NOTE — ED Notes (Signed)
Pt given a sandwich and drink  Wound care done  Tolerated well

## 2013-10-31 ENCOUNTER — Other Ambulatory Visit: Payer: Self-pay

## 2013-11-16 ENCOUNTER — Ambulatory Visit: Payer: Self-pay | Admitting: Internal Medicine

## 2014-02-20 ENCOUNTER — Ambulatory Visit: Payer: Self-pay | Admitting: Internal Medicine

## 2014-02-21 ENCOUNTER — Ambulatory Visit (INDEPENDENT_AMBULATORY_CARE_PROVIDER_SITE_OTHER): Payer: Self-pay | Admitting: Internal Medicine

## 2014-02-21 ENCOUNTER — Encounter: Payer: Self-pay | Admitting: Internal Medicine

## 2014-02-21 VITALS — BP 124/74 | HR 53 | Temp 97.9°F | Ht 67.0 in | Wt 167.0 lb

## 2014-02-21 DIAGNOSIS — Z716 Tobacco abuse counseling: Secondary | ICD-10-CM

## 2014-02-21 DIAGNOSIS — F319 Bipolar disorder, unspecified: Secondary | ICD-10-CM

## 2014-02-21 DIAGNOSIS — Z7189 Other specified counseling: Secondary | ICD-10-CM

## 2014-02-21 DIAGNOSIS — Z0000 Encounter for general adult medical examination without abnormal findings: Secondary | ICD-10-CM

## 2014-02-21 DIAGNOSIS — Z79899 Other long term (current) drug therapy: Secondary | ICD-10-CM

## 2014-02-21 DIAGNOSIS — Z113 Encounter for screening for infections with a predominantly sexual mode of transmission: Secondary | ICD-10-CM

## 2014-02-21 DIAGNOSIS — B20 Human immunodeficiency virus [HIV] disease: Secondary | ICD-10-CM

## 2014-02-21 LAB — COMPLETE METABOLIC PANEL WITH GFR
ALT: 119 U/L — ABNORMAL HIGH (ref 0–53)
AST: 91 U/L — ABNORMAL HIGH (ref 0–37)
Albumin: 3.7 g/dL (ref 3.5–5.2)
Alkaline Phosphatase: 88 U/L (ref 39–117)
BUN: 11 mg/dL (ref 6–23)
CALCIUM: 8.5 mg/dL (ref 8.4–10.5)
CO2: 27 meq/L (ref 19–32)
CREATININE: 0.8 mg/dL (ref 0.50–1.35)
Chloride: 107 mEq/L (ref 96–112)
GFR, Est Non African American: >89 mL/min
GLUCOSE: 86 mg/dL (ref 70–99)
POTASSIUM: 4 meq/L (ref 3.5–5.3)
Sodium: 141 mEq/L (ref 135–145)
Total Bilirubin: 0.2 mg/dL (ref 0.2–1.2)
Total Protein: 6.5 g/dL (ref 6.0–8.3)

## 2014-02-21 LAB — CBC WITH DIFFERENTIAL/PLATELET
Basophils Absolute: 0.1 10*3/uL (ref 0.0–0.1)
Basophils Relative: 1 % (ref 0–1)
EOS ABS: 0.2 10*3/uL (ref 0.0–0.7)
Eosinophils Relative: 2 % (ref 0–5)
HEMATOCRIT: 40.1 % (ref 39.0–52.0)
HEMOGLOBIN: 13.4 g/dL (ref 13.0–17.0)
LYMPHS PCT: 37 % (ref 12–46)
Lymphs Abs: 3.2 10*3/uL (ref 0.7–4.0)
MCH: 28.3 pg (ref 26.0–34.0)
MCHC: 33.4 g/dL (ref 30.0–36.0)
MCV: 84.8 fL (ref 78.0–100.0)
MONO ABS: 0.7 10*3/uL (ref 0.1–1.0)
MONOS PCT: 8 % (ref 3–12)
Neutro Abs: 4.5 10*3/uL (ref 1.7–7.7)
Neutrophils Relative %: 52 % (ref 43–77)
PLATELETS: 253 10*3/uL (ref 150–400)
RBC: 4.73 MIL/uL (ref 4.22–5.81)
RDW: 15.3 % (ref 11.5–15.5)
WBC: 8.7 10*3/uL (ref 4.0–10.5)

## 2014-02-21 LAB — LIPID PANEL
CHOL/HDL RATIO: 2.2 ratio
CHOLESTEROL: 111 mg/dL (ref 0–200)
HDL: 51 mg/dL (ref >39)
LDL Cholesterol: 45 mg/dL (ref 0–99)
Triglycerides: 76 mg/dL (ref <150)
VLDL: 15 mg/dL (ref 0–40)

## 2014-02-21 MED ORDER — EFAVIRENZ-EMTRICITAB-TENOFOVIR 600-200-300 MG PO TABS: 1.0000 | ORAL_TABLET | Freq: Every day | ORAL | Status: DC
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
## 2014-02-22 LAB — HIV-1 RNA ULTRAQUANT REFLEX TO GENTYP+
HIV 1 RNA QUANT: 67 {copies}/mL — AB (ref <20)
HIV-1 RNA QUANT, LOG: 1.83 {Log} — AB (ref <1.30)

## 2014-02-22 LAB — RPR

## 2014-02-22 LAB — HEPATITIS B SURFACE ANTIGEN: Hepatitis B Surface Ag: NEGATIVE

## 2014-02-22 LAB — HEPATITIS C ANTIBODY: HCV AB: NEGATIVE

## 2014-02-23 LAB — T-HELPER CELL (CD4) - (RCID CLINIC ONLY)
CD4 T CELL ABS: 1150 /uL (ref 400–2700)
CD4 T CELL HELPER: 33 % (ref 33–55)

## 2014-02-26 ENCOUNTER — Other Ambulatory Visit: Payer: Self-pay | Admitting: *Deleted

## 2014-02-26 DIAGNOSIS — B20 Human immunodeficiency virus [HIV] disease: Secondary | ICD-10-CM

## 2014-02-26 MED ORDER — EFAVIRENZ-EMTRICITAB-TENOFOVIR 600-200-300 MG PO TABS: 1.0000 | ORAL_TABLET | Freq: Every day | ORAL | Status: DC

## 2014-02-26 NOTE — Telephone Encounter (Signed)
ADAP application 

## 2014-02-27 LAB — HLA B*5701: HLA-B*5701 w/rflx HLA-B High: NEGATIVE

## 2014-03-17 ENCOUNTER — Other Ambulatory Visit: Payer: Self-pay | Admitting: Internal Medicine

## 2014-03-20 ENCOUNTER — Encounter: Payer: Self-pay | Admitting: Internal Medicine

## 2014-03-20 ENCOUNTER — Ambulatory Visit (INDEPENDENT_AMBULATORY_CARE_PROVIDER_SITE_OTHER): Payer: Self-pay | Admitting: Internal Medicine

## 2014-03-20 VITALS — BP 135/79 | HR 65 | Temp 98.0°F | Ht 67.0 in | Wt 160.0 lb

## 2014-03-20 DIAGNOSIS — R7401 Elevation of levels of liver transaminase levels: Secondary | ICD-10-CM

## 2014-03-20 DIAGNOSIS — R7402 Elevation of levels of lactic acid dehydrogenase (LDH): Secondary | ICD-10-CM

## 2014-03-20 DIAGNOSIS — R740 Nonspecific elevation of levels of transaminase and lactic acid dehydrogenase [LDH]: Secondary | ICD-10-CM

## 2014-03-20 DIAGNOSIS — F3162 Bipolar disorder, current episode mixed, moderate: Secondary | ICD-10-CM

## 2014-03-20 DIAGNOSIS — B20 Human immunodeficiency virus [HIV] disease: Secondary | ICD-10-CM

## 2014-03-20 DIAGNOSIS — Z23 Encounter for immunization: Secondary | ICD-10-CM

## 2014-03-20 NOTE — Addendum Note (Signed)
Addended by: Andree CossHOWELL, MICHELLE M on: 03/20/2014 11:51 AM   Modules accepted: Orders

## 2014-03-20 NOTE — Assessment & Plan Note (Addendum)
He is doing well on this and is hesitant to change.  He will continue with Atripla and I will have him return in about 4 weeks again for labs to assure remains suppressed and with [name] after labs.   Hep A # 2 and Hep B #1 today.

## 2014-03-20 NOTE — Addendum Note (Signed)
Addended by: Gardiner BarefootOMER, ROBERT W on: 03/20/2014 11:31 AM   Modules accepted: Level of Service

## 2014-03-20 NOTE — Assessment & Plan Note (Addendum)
Mild elevation last labs.  Will recheck next visit.  Recent negative hep C and B.

## 2014-03-20 NOTE — Progress Notes (Signed)
   Subjective:    Patient ID: Edwin Martinez, male    DOB: [birth year], 28 y.o.   MRN: [id number]  HPI Here for follow up of HIV. Has been on Atripla intermittently but over the last 6 months has been taking.  Saw [name] last month and got labs with genotype but viral load nearly suppressed.  States he loves Atripla and no issues with dreams, sleeping, dizziness.      Review of Systems  Constitutional: Negative for unexpected weight change.  Skin: Negative for rash.  Neurological: Negative for dizziness and light-headedness.  Psychiatric/Behavioral: Negative for sleep disturbance and decreased concentration. The patient is not nervous/anxious.        Objective:   Physical Exam  Constitutional: He appears well-developed and well-nourished. No distress.  HENT:  Mouth/Throat: No oropharyngeal exudate.  Eyes: No scleral icterus.  Cardiovascular: Normal rate, regular rhythm and normal heart sounds.   No murmur heard. Pulmonary/Chest: Effort normal and breath sounds normal. No respiratory distress. He has no wheezes.  Lymphadenopathy:    He has no cervical adenopathy.  Skin: No rash noted.          Assessment & Plan:

## 2014-03-20 NOTE — Assessment & Plan Note (Signed)
[facility]

## 2014-04-11 ENCOUNTER — Other Ambulatory Visit: Payer: Self-pay

## 2014-04-24 ENCOUNTER — Ambulatory Visit: Payer: Self-pay | Admitting: Internal Medicine

## 2014-05-09 ENCOUNTER — Other Ambulatory Visit (INDEPENDENT_AMBULATORY_CARE_PROVIDER_SITE_OTHER): Payer: Self-pay

## 2014-05-09 DIAGNOSIS — B20 Human immunodeficiency virus [HIV] disease: Secondary | ICD-10-CM

## 2014-05-09 LAB — CBC WITH DIFFERENTIAL/PLATELET
BASOS ABS: 0 10*3/uL (ref 0.0–0.1)
Basophils Relative: 0 % (ref 0–1)
EOS PCT: 2 % (ref 0–5)
Eosinophils Absolute: 0.2 10*3/uL (ref 0.0–0.7)
HCT: 43.4 % (ref 39.0–52.0)
Hemoglobin: 15.1 g/dL (ref 13.0–17.0)
LYMPHS ABS: 3.1 10*3/uL (ref 0.7–4.0)
LYMPHS PCT: 28 % (ref 12–46)
MCH: 29.6 pg (ref 26.0–34.0)
MCHC: 34.8 g/dL (ref 30.0–36.0)
MCV: 85.1 fL (ref 78.0–100.0)
Monocytes Absolute: 0.8 10*3/uL (ref 0.1–1.0)
Monocytes Relative: 7 % (ref 3–12)
NEUTROS PCT: 63 % (ref 43–77)
Neutro Abs: 7.1 10*3/uL (ref 1.7–7.7)
Platelets: 274 10*3/uL (ref 150–400)
RBC: 5.1 MIL/uL (ref 4.22–5.81)
RDW: 13.7 % (ref 11.5–15.5)
WBC: 11.2 10*3/uL — AB (ref 4.0–10.5)

## 2014-05-09 LAB — COMPLETE METABOLIC PANEL WITH GFR
ALT: 36 U/L (ref 0–53)
AST: 36 U/L (ref 0–37)
Albumin: 4.2 g/dL (ref 3.5–5.2)
Alkaline Phosphatase: 92 U/L (ref 39–117)
BILIRUBIN TOTAL: 0.4 mg/dL (ref 0.2–1.2)
BUN: 11 mg/dL (ref 6–23)
CHLORIDE: 106 meq/L (ref 96–112)
CO2: 21 meq/L (ref 19–32)
CREATININE: 0.98 mg/dL (ref 0.50–1.35)
Calcium: 9.2 mg/dL (ref 8.4–10.5)
GFR, Est African American: >89 mL/min
GFR, Est Non African American: >89 mL/min
Glucose, Bld: 90 mg/dL (ref 70–99)
Potassium: 4.2 mEq/L (ref 3.5–5.3)
Sodium: 139 mEq/L (ref 135–145)
Total Protein: 7 g/dL (ref 6.0–8.3)

## 2014-05-10 LAB — T-HELPER CELL (CD4) - (RCID CLINIC ONLY)
CD4 % Helper T Cell: 37 % (ref 33–55)
CD4 T CELL ABS: 1160 /uL (ref 400–2700)

## 2014-05-11 LAB — HIV-1 RNA QUANT-NO REFLEX-BLD
HIV 1 RNA QUANT: 30 {copies}/mL — AB (ref <20)
HIV-1 RNA Quant, Log: 1.48 {Log} — ABNORMAL HIGH (ref <1.30)

## 2014-05-23 ENCOUNTER — Ambulatory Visit: Payer: Self-pay | Admitting: Internal Medicine

## 2014-05-23 ENCOUNTER — Emergency Department (HOSPITAL_COMMUNITY)
Admission: EM | Admit: 2014-05-23 | Discharge: 2014-05-23 | Disposition: A | Discharge status: Other Acute Inpatient Hospital | Payer: Self-pay | Attending: Emergency Medicine | Admitting: Emergency Medicine

## 2014-05-23 ENCOUNTER — Encounter (HOSPITAL_COMMUNITY): Payer: Self-pay | Admitting: Emergency Medicine

## 2014-05-23 ENCOUNTER — Inpatient Hospital Stay (HOSPITAL_COMMUNITY)
Admission: AD | Admit: 2014-05-23 | Discharge: 2014-05-27 | DRG: 885 | Disposition: A | Discharge status: Home / Self Care | Payer: Federal, State, Local not specified - Other | Source: Intra-hospital | Attending: Psychiatry | Admitting: Psychiatry

## 2014-05-23 ENCOUNTER — Encounter (HOSPITAL_COMMUNITY): Payer: Self-pay | Admitting: Behavioral Health

## 2014-05-23 DIAGNOSIS — F4310 Post-traumatic stress disorder, unspecified: Secondary | ICD-10-CM | POA: Diagnosis present

## 2014-05-23 DIAGNOSIS — Z21 Asymptomatic human immunodeficiency virus [HIV] infection status: Secondary | ICD-10-CM | POA: Insufficient documentation

## 2014-05-23 DIAGNOSIS — E872 Acidosis, unspecified: Secondary | ICD-10-CM | POA: Insufficient documentation

## 2014-05-23 DIAGNOSIS — B20 Human immunodeficiency virus [HIV] disease: Secondary | ICD-10-CM | POA: Diagnosis present

## 2014-05-23 DIAGNOSIS — J45909 Unspecified asthma, uncomplicated: Secondary | ICD-10-CM | POA: Diagnosis present

## 2014-05-23 DIAGNOSIS — Z79899 Other long term (current) drug therapy: Secondary | ICD-10-CM | POA: Insufficient documentation

## 2014-05-23 DIAGNOSIS — T1491XA Suicide attempt, initial encounter: Secondary | ICD-10-CM

## 2014-05-23 DIAGNOSIS — Z833 Family history of diabetes mellitus: Secondary | ICD-10-CM

## 2014-05-23 DIAGNOSIS — F319 Bipolar disorder, unspecified: Secondary | ICD-10-CM | POA: Diagnosis present

## 2014-05-23 DIAGNOSIS — T50902S Poisoning by unspecified drugs, medicaments and biological substances, intentional self-harm, sequela: Secondary | ICD-10-CM

## 2014-05-23 DIAGNOSIS — Z8249 Family history of ischemic heart disease and other diseases of the circulatory system: Secondary | ICD-10-CM | POA: Diagnosis not present

## 2014-05-23 DIAGNOSIS — G4700 Insomnia, unspecified: Secondary | ICD-10-CM | POA: Diagnosis present

## 2014-05-23 DIAGNOSIS — R45851 Suicidal ideations: Secondary | ICD-10-CM

## 2014-05-23 DIAGNOSIS — F1010 Alcohol abuse, uncomplicated: Secondary | ICD-10-CM | POA: Diagnosis present

## 2014-05-23 DIAGNOSIS — F908 Attention-deficit hyperactivity disorder, other type: Secondary | ICD-10-CM

## 2014-05-23 DIAGNOSIS — F17200 Nicotine dependence, unspecified, uncomplicated: Secondary | ICD-10-CM | POA: Diagnosis present

## 2014-05-23 DIAGNOSIS — F410 Panic disorder [episodic paroxysmal anxiety] without agoraphobia: Secondary | ICD-10-CM | POA: Diagnosis present

## 2014-05-23 DIAGNOSIS — R740 Nonspecific elevation of levels of transaminase and lactic acid dehydrogenase [LDH]: Secondary | ICD-10-CM

## 2014-05-23 DIAGNOSIS — E8729 Other acidosis: Secondary | ICD-10-CM

## 2014-05-23 DIAGNOSIS — F209 Schizophrenia, unspecified: Secondary | ICD-10-CM | POA: Insufficient documentation

## 2014-05-23 DIAGNOSIS — F3130 Bipolar disorder, current episode depressed, mild or moderate severity, unspecified: Secondary | ICD-10-CM | POA: Insufficient documentation

## 2014-05-23 DIAGNOSIS — F332 Major depressive disorder, recurrent severe without psychotic features: Principal | ICD-10-CM | POA: Diagnosis present

## 2014-05-23 DIAGNOSIS — F1210 Cannabis abuse, uncomplicated: Secondary | ICD-10-CM | POA: Diagnosis present

## 2014-05-23 DIAGNOSIS — Z880 Allergy status to penicillin: Secondary | ICD-10-CM | POA: Insufficient documentation

## 2014-05-23 DIAGNOSIS — F902 Attention-deficit hyperactivity disorder, combined type: Secondary | ICD-10-CM

## 2014-05-23 DIAGNOSIS — F411 Generalized anxiety disorder: Secondary | ICD-10-CM | POA: Diagnosis present

## 2014-05-23 DIAGNOSIS — R82998 Other abnormal findings in urine: Secondary | ICD-10-CM | POA: Insufficient documentation

## 2014-05-23 DIAGNOSIS — F3162 Bipolar disorder, current episode mixed, moderate: Secondary | ICD-10-CM

## 2014-05-23 DIAGNOSIS — R7401 Elevation of levels of liver transaminase levels: Secondary | ICD-10-CM

## 2014-05-23 DIAGNOSIS — Z87828 Personal history of other (healed) physical injury and trauma: Secondary | ICD-10-CM | POA: Insufficient documentation

## 2014-05-23 HISTORY — DX: Unspecified asthma, uncomplicated: J45.909

## 2014-05-23 LAB — BASIC METABOLIC PANEL
Anion gap: 15 (ref 5–15)
BUN: 5 mg/dL — AB (ref 6–23)
CALCIUM: 9.4 mg/dL (ref 8.4–10.5)
CO2: 19 meq/L (ref 19–32)
CREATININE: 0.77 mg/dL (ref 0.50–1.35)
Chloride: 104 mEq/L (ref 96–112)
GFR calc non Af Amer: >90 mL/min (ref >90)
Glucose, Bld: 103 mg/dL — ABNORMAL HIGH (ref 70–99)
Potassium: 3.6 mEq/L — ABNORMAL LOW (ref 3.7–5.3)
Sodium: 138 mEq/L (ref 137–147)

## 2014-05-23 LAB — SALICYLATE LEVEL: Salicylate Lvl: <2 mg/dL — ABNORMAL LOW (ref 2.8–20.0)

## 2014-05-23 LAB — CBC
HCT: 43.1 % (ref 39.0–52.0)
Hemoglobin: 14.6 g/dL (ref 13.0–17.0)
MCH: 28.9 pg (ref 26.0–34.0)
MCHC: 33.9 g/dL (ref 30.0–36.0)
MCV: 85.2 fL (ref 78.0–100.0)
PLATELETS: 229 10*3/uL (ref 150–400)
RBC: 5.06 MIL/uL (ref 4.22–5.81)
RDW: 13.3 % (ref 11.5–15.5)
WBC: 7.4 10*3/uL (ref 4.0–10.5)

## 2014-05-23 LAB — COMPREHENSIVE METABOLIC PANEL
ALBUMIN: 4.1 g/dL (ref 3.5–5.2)
ALK PHOS: 104 U/L (ref 39–117)
ALT: 32 U/L (ref 0–53)
AST: 34 U/L (ref 0–37)
Anion gap: 20 — ABNORMAL HIGH (ref 5–15)
BILIRUBIN TOTAL: 0.3 mg/dL (ref 0.3–1.2)
BUN: 8 mg/dL (ref 6–23)
CO2: 17 mEq/L — ABNORMAL LOW (ref 19–32)
Calcium: 9.5 mg/dL (ref 8.4–10.5)
Chloride: 103 mEq/L (ref 96–112)
Creatinine, Ser: 0.78 mg/dL (ref 0.50–1.35)
GFR calc Af Amer: >90 mL/min (ref >90)
GFR calc non Af Amer: >90 mL/min (ref >90)
Glucose, Bld: 94 mg/dL (ref 70–99)
POTASSIUM: 3.6 meq/L — AB (ref 3.7–5.3)
Sodium: 140 mEq/L (ref 137–147)
Total Protein: 8 g/dL (ref 6.0–8.3)

## 2014-05-23 LAB — RAPID URINE DRUG SCREEN, HOSP PERFORMED
AMPHETAMINES: NOT DETECTED
Barbiturates: NOT DETECTED
Benzodiazepines: NOT DETECTED
Cocaine: POSITIVE — AB
OPIATES: NOT DETECTED
TETRAHYDROCANNABINOL: NOT DETECTED

## 2014-05-23 LAB — ETHANOL: Alcohol, Ethyl (B): 27 mg/dL — ABNORMAL HIGH (ref 0–11)

## 2014-05-23 LAB — ACETAMINOPHEN LEVEL: Acetaminophen (Tylenol), Serum: <15 ug/mL (ref 10–30)

## 2014-05-23 LAB — CBG MONITORING, ED: Glucose-Capillary: 95 mg/dL (ref 70–99)

## 2014-05-23 MED ORDER — NICOTINE 21 MG/24HR TD PT24: 21.0000 mg | MEDICATED_PATCH | Freq: Every day | TRANSDERMAL | Status: DC

## 2014-05-23 MED ORDER — VITAMIN B-1 100 MG PO TABS
100.0000 mg | ORAL_TABLET | Freq: Every day | ORAL | Status: DC
  Administered 2014-05-24 – 2014-05-27 (×4): 100 mg via ORAL
  Filled 2014-05-23 (×6): qty 1

## 2014-05-23 MED ORDER — CHLORDIAZEPOXIDE HCL 25 MG PO CAPS: 25.0000 mg | ORAL_CAPSULE | Freq: Every day | ORAL | Status: DC

## 2014-05-23 MED ORDER — TRAZODONE HCL 50 MG PO TABS
50.0000 mg | ORAL_TABLET | Freq: Every evening | ORAL | Status: DC | PRN
  Administered 2014-05-24 – 2014-05-26 (×5): 50 mg via ORAL
  Filled 2014-05-23 (×3): qty 1
  Filled 2014-05-23 (×2): qty 28
  Filled 2014-05-23 (×6): qty 1
  Filled 2014-05-23 (×2): qty 28

## 2014-05-23 MED ORDER — SODIUM CHLORIDE 0.9 % IV BOLUS (SEPSIS)
1000.0000 mL | Freq: Once | INTRAVENOUS | Status: AC
  Administered 2014-05-23: 1000 mL via INTRAVENOUS

## 2014-05-23 MED ORDER — BUSPIRONE HCL 10 MG PO TABS
20.0000 mg | ORAL_TABLET | Freq: Two times a day (BID) | ORAL | Status: DC
  Administered 2014-05-24 – 2014-05-27 (×7): 20 mg via ORAL
  Filled 2014-05-23: qty 56
  Filled 2014-05-23 (×3): qty 2
  Filled 2014-05-23: qty 56
  Filled 2014-05-23 (×5): qty 2
  Filled 2014-05-23: qty 56
  Filled 2014-05-23: qty 2
  Filled 2014-05-23: qty 56

## 2014-05-23 MED ORDER — HYDROXYZINE HCL 25 MG PO TABS
25.0000 mg | ORAL_TABLET | Freq: Four times a day (QID) | ORAL | Status: AC | PRN
Start: 2014-05-23 — End: 2014-05-26
  Administered 2014-05-24 – 2014-05-26 (×3): 25 mg via ORAL
  Filled 2014-05-23 (×3): qty 1

## 2014-05-23 MED ORDER — MAGNESIUM HYDROXIDE 400 MG/5ML PO SUSP
30.0000 mL | Freq: Every day | ORAL | Status: DC | PRN
  Administered 2014-05-24 – 2014-05-25 (×2): 30 mL via ORAL

## 2014-05-23 MED ORDER — LORAZEPAM 2 MG/ML IJ SOLN
1.0000 mg | Freq: Once | INTRAMUSCULAR | Status: AC
  Administered 2014-05-23: 1 mg via INTRAVENOUS
  Filled 2014-05-23: qty 1

## 2014-05-23 MED ORDER — ALUM & MAG HYDROXIDE-SIMETH 200-200-20 MG/5ML PO SUSP: 30.0000 mL | ORAL | Status: DC | PRN

## 2014-05-23 MED ORDER — CHLORDIAZEPOXIDE HCL 25 MG PO CAPS
25.0000 mg | ORAL_CAPSULE | ORAL | Status: DC
Start: 2014-05-26 — End: 2014-05-24

## 2014-05-23 MED ORDER — THIAMINE HCL 100 MG/ML IJ SOLN
100.0000 mg | Freq: Once | INTRAMUSCULAR | Status: DC
Start: 2014-05-23 — End: 2014-05-27

## 2014-05-23 MED ORDER — IBUPROFEN 200 MG PO TABS
600.0000 mg | ORAL_TABLET | Freq: Three times a day (TID) | ORAL | Status: DC | PRN
Start: 2014-05-23 — End: 2014-05-23

## 2014-05-23 MED ORDER — ACTIDOSE WITH SORBITOL 50 GM/240ML PO LIQD
50.0000 g | ORAL | Status: AC
  Administered 2014-05-23: 50 g via ORAL
  Filled 2014-05-23: qty 240

## 2014-05-23 MED ORDER — DIVALPROEX SODIUM 500 MG PO DR TAB
500.0000 mg | DELAYED_RELEASE_TABLET | Freq: Two times a day (BID) | ORAL | Status: DC
  Administered 2014-05-24 – 2014-05-27 (×7): 500 mg via ORAL
  Filled 2014-05-23: qty 28
  Filled 2014-05-23 (×5): qty 1
  Filled 2014-05-23: qty 28
  Filled 2014-05-23 (×3): qty 1
  Filled 2014-05-23 (×2): qty 28
  Filled 2014-05-23: qty 1

## 2014-05-23 MED ORDER — CHLORDIAZEPOXIDE HCL 25 MG PO CAPS
25.0000 mg | ORAL_CAPSULE | Freq: Four times a day (QID) | ORAL | Status: DC
  Filled 2014-05-23: qty 1

## 2014-05-23 MED ORDER — CHLORDIAZEPOXIDE HCL 25 MG PO CAPS: 25.0000 mg | ORAL_CAPSULE | Freq: Three times a day (TID) | ORAL | Status: DC

## 2014-05-23 MED ORDER — LORAZEPAM 1 MG PO TABS
1.0000 mg | ORAL_TABLET | Freq: Three times a day (TID) | ORAL | Status: DC | PRN
Start: 2014-05-23 — End: 2014-05-23

## 2014-05-23 MED ORDER — ONDANSETRON HCL 4 MG PO TABS: 4.0000 mg | ORAL_TABLET | Freq: Three times a day (TID) | ORAL | Status: DC | PRN

## 2014-05-23 MED ORDER — ONDANSETRON 4 MG PO TBDP: 4.0000 mg | ORAL_TABLET | Freq: Four times a day (QID) | ORAL | Status: AC | PRN

## 2014-05-23 MED ORDER — ACETAMINOPHEN 325 MG PO TABS: 650.0000 mg | ORAL_TABLET | ORAL | Status: DC | PRN

## 2014-05-23 MED ORDER — ZOLPIDEM TARTRATE 5 MG PO TABS
5.0000 mg | ORAL_TABLET | Freq: Every evening | ORAL | Status: DC | PRN
Start: 2014-05-23 — End: 2014-05-23

## 2014-05-23 MED ORDER — ACETAMINOPHEN 325 MG PO TABS
650.0000 mg | ORAL_TABLET | Freq: Four times a day (QID) | ORAL | Status: DC | PRN
  Administered 2014-05-24: 650 mg via ORAL
  Filled 2014-05-23: qty 2

## 2014-05-23 MED ORDER — ADULT MULTIVITAMIN W/MINERALS CH
1.0000 | ORAL_TABLET | Freq: Every day | ORAL | Status: DC
  Administered 2014-05-24 – 2014-05-27 (×4): 1 via ORAL
  Filled 2014-05-23 (×6): qty 1

## 2014-05-23 MED ORDER — CHLORDIAZEPOXIDE HCL 25 MG PO CAPS
25.0000 mg | ORAL_CAPSULE | Freq: Four times a day (QID) | ORAL | Status: AC | PRN
Start: 2014-05-23 — End: 2014-05-26

## 2014-05-23 MED ORDER — LOPERAMIDE HCL 2 MG PO CAPS: 2.0000 mg | ORAL_CAPSULE | ORAL | Status: AC | PRN

## 2014-05-23 NOTE — BH Assessment (Signed)
Accepted to [facility] by Dr. Claudius Sis. The room assignment is 404-2. Support paperwork completed. Patient will be transported to [facility] by [place] after 8pm. Nursing report [phone].

## 2014-05-23 NOTE — ED Notes (Signed)
Denies N/V and declines prn medications. Informed of transfer to [facility] after 2000.

## 2014-05-23 NOTE — Consult Note (Signed)
  Review of Systems  Constitutional: Negative.   HENT: Negative.   Eyes: Negative.   Respiratory: Negative.   Cardiovascular: Negative.   Gastrointestinal: Negative.   Genitourinary: Negative.   Musculoskeletal: Negative.   Skin: Negative.   Neurological: Negative.   Endo/Heme/Allergies: Negative.   Psychiatric/Behavioral: Positive for depression and suicidal ideas.    

## 2014-05-23 NOTE — ED Notes (Signed)
EMS called to home.  Patient reports taking 20+ Atripla with statements about he wants to die to EMS and [employer] Per his roommate he has also been drinking all day.   18g left AC per EMS.

## 2014-05-23 NOTE — ED Notes (Signed)
Patient has one bag of belongings at nurse's station. Bag has pair of running shoes and cloths. Patient has cell phone in bag.

## 2014-05-23 NOTE — Progress Notes (Signed)
  CARE MANAGEMENT ED NOTE 05/23/2014  Patient:  Edwin Martinez, Edwin Martinez   Account Number:  [id number]  Date Initiated:  05/23/2014  Documentation initiated by:  Radford Pax  Subjective/Objective Assessment:   Patient presents to Ed with progressively worsening depression, feelings of worthlessness     Subjective/Objective Assessment Detail:   28 year old male with a past medical history of HIV, bipolar, depression, previous hospitalizations for psychiatric complaints, previous suicide attempts including cutting his wrists in 2001     Action/Plan:   Transfer to [facility] for treatment of depression with suicidal ideation   Action/Plan Detail:   Anticipated DC Date:       Status Recommendation to Physician:   Result of Recommendation:    Other ED Services  Consult Working Plan    DC Planning Services  Other  PCP issues    Choice offered to / List presented to:            Status of service:  Completed, signed off  ED Comments:   ED Comments Detail:  EDCM spoke to patient at bedside.  Patient reports he is unemployed and currently living with his partner.  Patient confirms his pcp is Dr. Judyann Munson who is also his infectious disease doctor.  Patient reports his medication for HIV is covered undered ADAP.  Patient reports he does not have any difficulty paying for his medications. Patient reports he was recently incarcerated and when was let out of prison was was given information for [facility], "But I never got around to it."  [facility] provided patient with a list of pcps who accept self pay patients, financial resources in the community such as local churches and [employer], urban ministies, list of discounted pharmacies and websites needymeds.org and Good https://figueroa.info/ for medication assistance, and dental assistance for uninsured patiens.  EDCM also provided patient with pamphlet for [facility] and printed information for [facility].  Patient thankful for resources.  No further EDCM needs at  this time.
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
## 2014-05-23 NOTE — ED Notes (Signed)
David at poison control was notified.  He said he would pass this case along to the next shift.

## 2014-05-23 NOTE — Tx Team (Signed)
Initial Interdisciplinary Treatment Plan   PATIENT STRESSORS: Health problems Legal issue Medication change or noncompliance Substance abuse Traumatic event   PROBLEM LIST: Problem List/Patient Goals Date to be addressed Date deferred Reason deferred Estimated date of resolution  Suicide attempt 05/23/2014     depression 05/23/2014     Medication adjustment 05/23/2014     Health concerns 05/23/2014     anxiety 05/23/2014                              DISCHARGE CRITERIA:  Ability to meet basic life and health needs Improved stabilization in mood, thinking, and/or behavior Medical problems require only outpatient monitoring Need for constant or close observation no longer present Safe-care adequate arrangements made Verbal commitment to aftercare and medication compliance  PRELIMINARY DISCHARGE PLAN: Attend aftercare/continuing care group Outpatient therapy Return to previous living arrangement  PATIENT/FAMIILY INVOLVEMENT: This treatment plan has been presented to and reviewed with the patient, Edwin Martinez.  The patient and family have been given the opportunity to ask questions and make suggestions.  Angeline Slim M 05/23/2014, 11:59 PM

## 2014-05-23 NOTE — Consult Note (Signed)
[facility] Consult   Reason for Consult:  Suicidal overdose Referring Physician:  ER MD  Edwin Martinez is an 28 y.o. male. 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 HIV (human immunodeficiency virus infection)   . Bipolar 1 disorder   . Schizophrenia    AXIS IV:  economic problems and other psychosocial or environmental problems AXIS V:  41-50 serious symptoms  Plan:  Recommend psychiatric Inpatient admission when medically cleared.  Subjective:   Edwin Martinez is a 28 y.o. male patient admitted with suicidal overdose.  HPI:  Edwin Martinez says he took 28+ Atripla tablets in a suicidal attempt.  Says he feels hopeless, overwhelmed and just does not want to live anymore. Edwin Martinez he has been having dreams about his mother who died in 06-Dec-2008 and that might be what is making him suicidal. He has tried to kill himself more than once in the past.  Says he is applying to disability for "bipolar, insomnia, HIV and just everything".  His partner supports him.  His partner reportedly reported that he was drinking all day.  He denies that saying he just drank a beer. HPI Elements:   Location:  depression. Quality:  suicidal. Severity:  cannot contract for safety. Timing:  having dreams about his deceased mother. Duration:  depressed for years, worse in the last few weeks. Context:  as above.  Past Psychiatric History: Past Medical History  Diagnosis Date  . HIV (human immunodeficiency virus infection)   . Bipolar 1 disorder   . Schizophrenia     reports that he has been smoking Cigarettes.  He started smoking about 22 years ago. He has been smoking about 0.50 packs per day. He has never used smokeless tobacco. He reports that he drinks about 40 ounces of alcohol per week. He reports that he uses illicit drugs (Marijuana) about  twice per week. Family History  Problem Relation Age of Onset  . Huntington's disease Father   . Heart disease Mother            Allergies:   Allergies  Allergen Reactions  . Esomeprazole Magnesium     REACTION: cough  . Peanuts [Peanut Oil]     Rash   . Penicillins     REACTION: rash  . Bactrim [Sulfamethoxazole-Tmp Ds] Rash    Patient reported    ACT Assessment Complete:  Yes:    Educational Status    Risk to Self: Risk to self with the past 6 months Is patient at risk for suicide?: Yes Substance abuse history and/or treatment for substance abuse?: Yes  Risk to Others:    Abuse:    Prior Inpatient Therapy:    Prior Outpatient Therapy:    Additional Information:                    Objective: Blood pressure 127/80, pulse 56, temperature 98.3 F (36.8 C), temperature source Oral, resp. rate 16, SpO2 98.00%.There is no weight on file to calculate BMI. Results for orders placed during the hospital encounter of 05/23/14 (from the past 72 hour(s))  CBG MONITORING, ED     Status: None   Collection Time    05/23/14  6:15 AM      Result Value Ref Range   Glucose-Capillary 95  70 - 99 mg/dL  URINE RAPID DRUG  SCREEN (HOSP PERFORMED)     Status: Abnormal   Collection Time    05/23/14  6:16 AM      Result Value Ref Range   Opiates NONE DETECTED  NONE DETECTED   Cocaine POSITIVE (*) NONE DETECTED   Benzodiazepines NONE DETECTED  NONE DETECTED   Amphetamines NONE DETECTED  NONE DETECTED   Tetrahydrocannabinol NONE DETECTED  NONE DETECTED   Barbiturates NONE DETECTED  NONE DETECTED   Comment:            DRUG SCREEN FOR MEDICAL PURPOSES     ONLY.  IF CONFIRMATION IS NEEDED     FOR ANY PURPOSE, NOTIFY LAB     WITHIN 5 DAYS.                LOWEST DETECTABLE LIMITS     FOR URINE DRUG SCREEN     Drug Class       Cutoff (ng/mL)     Amphetamine      1000     Barbiturate      200     Benzodiazepine   370     Tricyclics       488     Opiates          300      Cocaine          300     THC              50  CBC     Status: None   Collection Time    05/23/14  6:26 AM      Result Value Ref Range   WBC 7.4  4.0 - 10.5 K/uL   RBC 5.06  4.22 - 5.81 MIL/uL   Hemoglobin 14.6  13.0 - 17.0 g/dL   HCT 43.1  39.0 - 52.0 %   MCV 85.2  78.0 - 100.0 fL   MCH 28.9  26.0 - 34.0 pg   MCHC 33.9  30.0 - 36.0 g/dL   RDW 13.3  11.5 - 15.5 %   Platelets 229  150 - 400 K/uL  COMPREHENSIVE METABOLIC PANEL     Status: Abnormal   Collection Time    05/23/14  6:26 AM      Result Value Ref Range   Sodium 140  137 - 147 mEq/L   Potassium 3.6 (*) 3.7 - 5.3 mEq/L   Chloride 103  96 - 112 mEq/L   CO2 17 (*) 19 - 32 mEq/L   Glucose, Bld 94  70 - 99 mg/dL   BUN 8  6 - 23 mg/dL   Creatinine, Ser 0.78  0.50 - 1.35 mg/dL   Calcium 9.5  8.4 - 10.5 mg/dL   Total Protein 8.0  6.0 - 8.3 g/dL   Albumin 4.1  3.5 - 5.2 g/dL   AST 34  0 - 37 U/L   Comment: SLIGHT HEMOLYSIS     HEMOLYSIS AT THIS LEVEL MAY AFFECT RESULT   ALT 32  0 - 53 U/L   Alkaline Phosphatase 104  39 - 117 U/L   Total Bilirubin 0.3  0.3 - 1.2 mg/dL   GFR calc non Af Amer >90  >90 mL/min   GFR calc Af Amer >90  >90 mL/min   Comment: (NOTE)     The eGFR has been calculated using the CKD EPI equation.     This calculation has not been validated in all clinical situations.     eGFR's persistently <90 mL/min  signify possible Chronic Kidney     Disease.   Anion gap 20 (*) 5 - 15  ETHANOL     Status: Abnormal   Collection Time    05/23/14  6:26 AM      Result Value Ref Range   Alcohol, Ethyl (B) 27 (*) 0 - 11 mg/dL   Comment:            LOWEST DETECTABLE LIMIT FOR     SERUM ALCOHOL IS 11 mg/dL     FOR MEDICAL PURPOSES ONLY  ACETAMINOPHEN LEVEL     Status: None   Collection Time    05/23/14  6:26 AM      Result Value Ref Range   Acetaminophen (Tylenol), Serum <15.0  10 - 30 ug/mL   Comment:            THERAPEUTIC CONCENTRATIONS VARY     SIGNIFICANTLY. A RANGE OF 10-30     ug/mL MAY BE AN EFFECTIVE      CONCENTRATION FOR MANY PATIENTS.     HOWEVER, SOME ARE BEST TREATED     AT CONCENTRATIONS OUTSIDE THIS     RANGE.     ACETAMINOPHEN CONCENTRATIONS     >150 ug/mL AT 4 HOURS AFTER     INGESTION AND >50 ug/mL AT 12     HOURS AFTER INGESTION ARE     OFTEN ASSOCIATED WITH TOXIC     REACTIONS.  SALICYLATE LEVEL     Status: Abnormal   Collection Time    05/23/14  6:26 AM      Result Value Ref Range   Salicylate Lvl <5.2 (*) 2.8 - 20.0 mg/dL   Labs are reviewed and are pertinent for some alcohol.  Current Facility-Administered Medications  Medication Dose Route Frequency Provider Last Rate Last Dose  . acetaminophen (TYLENOL) tablet 650 mg  650 mg Oral Q4H PRN Margarita Mail, PA-C      . alum & mag hydroxide-simeth (MAALOX/MYLANTA) 200-200-20 MG/5ML suspension 30 mL  30 mL Oral PRN Margarita Mail, PA-C      . ibuprofen (ADVIL,MOTRIN) tablet 600 mg  600 mg Oral Q8H PRN Margarita Mail, PA-C      . LORazepam (ATIVAN) tablet 1 mg  1 mg Oral Q8H PRN Margarita Mail, PA-C      . nicotine (NICODERM CQ - dosed in mg/24 hours) patch 21 mg  21 mg Transdermal Daily Abigail Harris, PA-C      . ondansetron (ZOFRAN) tablet 4 mg  4 mg Oral Q8H PRN Margarita Mail, PA-C      . zolpidem (AMBIEN) tablet 5 mg  5 mg Oral QHS PRN Margarita Mail, PA-C       Current Outpatient Prescriptions  Medication Sig Dispense Refill  . ATRIPLA 600-200-300 MG per tablet TAKE 1 TABLET BY MOUTH EVERY NIGHT AT BEDTIME  30 tablet  4  . busPIRone (BUSPAR) 10 MG tablet Take 20 mg by mouth 2 (two) times daily. Mental health      . divalproex (DEPAKOTE) 500 MG DR tablet Take 500 mg by mouth 2 (two) times daily. Mental health        Psychiatric Specialty Exam:     Blood pressure 127/80, pulse 56, temperature 98.3 F (36.8 C), temperature source Oral, resp. rate 16, SpO2 98.00%.There is no weight on file to calculate BMI.  General Appearance: Casual  Eye Contact::  Good  Speech:  Clear and Coherent  Volume:  Normal   Mood:  Depressed  Affect:  Congruent  Thought  Process:  Coherent and Logical  Orientation:  Full (Time, Place, and Person)  Thought Content:  Negative  Suicidal Thoughts:  Yes.  with intent/plan  Homicidal Thoughts:  No  Memory:  Immediate;   Good Recent;   Good Remote;   Good  Judgement:  Intact  Insight:  Fair  Psychomotor Activity:  Normal  Concentration:  Good  Recall:  Good  Fund of Knowledge:Good  Language: Good  Akathisia:  Negative  Handed:  Right  AIMS (if indicated):     Assets:  Communication Skills Desire for Improvement Housing Intimacy  Sleep:      Musculoskeletal: Strength & Muscle Tone: within normal limits Gait & Station: normal Patient leans: N/A  Treatment Plan Summary: Daily contact with patient to assess and evaluate symptoms and progress in treatment Medication management refer to inpatient for treatment of depression with suicidal ideation  Edwin Martinez Spicher D 05/23/2014 2:48 PM

## 2014-05-23 NOTE — Progress Notes (Signed)
Nursing Admission Note: 28 y/o male who presents involuntarily s/p OD on HIV medication.  Patient states he has been stressed and increasingly depressed recently.  Patient states he has been having nightmares about his deceased mother andstates he has been trying to get disability but is having conflict with his lawyer.  Patient states she currently lives with his male partner and states it is a health relationship.  Patient states he is overwhelmed and feels hopeless.  Patient states he also needs a medications adjustment because he states his medications are not working.  Patient presents extremely drowsy  And forwards little information.  Patient skin assessed and patient has not skin issues but has multiple tattoos over whole body.  Patient currently endorses SI but verbally contracts for safety.  Patient denies HI and denies AVH.  Consents obtained, fall safety plan explained and patient verbalized understanding. Belongings secured in locker #32.  Food and fluids offered and patient accepted both. Patient escorted and oriented to the unit.  Patient offered no additional questions or concerns.

## 2014-05-23 NOTE — ED Notes (Signed)
Pt transported to [facility] by [facility] for continuation of specialized care. He left in no acute distress. 

## 2014-05-23 NOTE — ED Notes (Signed)
Patient vomited copious amount of charcoal.  Popcicle given and contacted EDP for med orders.

## 2014-05-23 NOTE — ED Provider Notes (Signed)
Medical screening examination/treatment/procedure(s) were performed by non-physician practitioner and as supervising physician I was immediately available for consultation/collaboration.   EKG Interpretation   Date/Time:  Wednesday May 23 2014 06:20:19 EDT Ventricular Rate:  59 PR Interval:  172 QRS Duration: 96 QT Interval:  416 QTC Calculation: 412 R Axis:   79 Text Interpretation:  Sinus rhythm ST elev, probable normal early repol  pattern No old tracing to compare Confirmed by Irys Nigh  MD, Bentzion Dauria ([id number]) on  05/23/2014 7:11:55 AM       Olivia Mackie, MD 05/23/14 2121

## 2014-05-24 ENCOUNTER — Encounter (HOSPITAL_COMMUNITY): Payer: Self-pay | Admitting: Behavioral Health

## 2014-05-24 ENCOUNTER — Telehealth: Payer: Self-pay | Admitting: Infectious Disease

## 2014-05-24 DIAGNOSIS — F39 Unspecified mood [affective] disorder: Secondary | ICD-10-CM

## 2014-05-24 DIAGNOSIS — R45851 Suicidal ideations: Secondary | ICD-10-CM

## 2014-05-24 LAB — VALPROIC ACID LEVEL: Valproic Acid Lvl: <10 ug/mL — ABNORMAL LOW (ref 50.0–100.0)

## 2014-05-24 MED ORDER — EFAVIRENZ-EMTRICITAB-TENOFOVIR 600-200-300 MG PO TABS
1.0000 | ORAL_TABLET | Freq: Every day | ORAL | Status: DC
  Administered 2014-05-24: 1 via ORAL
  Filled 2014-05-24 (×3): qty 1

## 2014-05-24 NOTE — Progress Notes (Signed)
Adult Psychoeducational Group Note  Date:  05/24/2014 Time:  10:00am Group Topic/Focus:  Making Healthy Choices:   The focus of this group is to help patients identify negative/unhealthy choices they were using prior to admission and identify positive/healthier coping strategies to replace them upon discharge.  Participation Level:  Active  Participation Quality:  Appropriate and Attentive  Affect:  Appropriate  Cognitive:  Alert and Appropriate  Insight: Appropriate  Engagement in Group:  Engaged  Modes of Intervention:  Discussion and Education  Additional Comments:  Patient attended and participated in group. Discussion was on making lifestyle changes. Question was asked What is one positive change you can make to improve your life and current situation. Pt stated to stay around positive people and stay on medication.  Shelly Bombard D 05/24/2014, 11:35 AM

## 2014-05-24 NOTE — [facility] Group Notes (Signed)
0900 nursing orientation group    The focus of this group is to educate the patient on the purpose and policies of crisis stabilization and provide a format to answer questions about their admission.  The group details unit policies and expectations of patients while admitted.   Pt did not attend he was with the doctor.

## 2014-05-24 NOTE — Progress Notes (Signed)
Pt has been up for groups today and interacting with peers and staff appropriately.  He refused his 0800 librium he stated,"I never really drink that much I don't feel I need it" He rated both his depression and hopelessness a 8.5 and his anxiety a 10 on his self-inventory. He did admit to some passive S/I with no plan here in the hospital and contracts to come to staff before acting on any thoughts.  He denied any H/I A/V/H. His goal today,"get my meds balanced" and to be "honest and truthful"
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
## 2014-05-24 NOTE — [facility] Group Notes (Signed)
[facility] LCSW Group Therapy  Living A Balanced Life  1:15 - 2: 30          05/24/2014    Type of Therapy:  Group Therapy  Participation Level:  Appropriate  Participation Quality:  Appropriate  Affect:  Appropriate  Cognitive:  Attentive Appropriate  Insight: Developing/Improving  Engagement in Therapy:  Developing/Improving  Modes of Intervention:  Discussion Exploration Problem-Solving Supportive   Summary of Progress/Problems: Topic for group was Living a Balanced Life.  Patient was able to show how life has become unbalanced. He stated he tries to keep his life balance by staying around positive and getting tin touch with his higher power.  He identified staying on medications as something he will need to improve upon.  Wynn Banker 05/24/2014
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
## 2014-05-24 NOTE — Telephone Encounter (Signed)
Agree with changing ART. 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 Hope he recovers from the [facility]

## 2014-05-24 NOTE — B[facility] Suicide Risk Assessment (Signed)
Suicide Risk Assessment  Admission Assessment     Nursing information obtained from:  Patient Demographic factors:  Male;Gay, lesbian, or bisexual orientation;Low socioeconomic status;Unemployed Current Mental Status:  Suicidal ideation indicated by patient;Suicide plan;Self-harm thoughts Loss Factors:  Decline in physical health;Financial problems / change in socioeconomic status Historical Factors:  Prior suicide attempts;Impulsivity Risk Reduction Factors:  Positive therapeutic relationship;Religious beliefs about death Total Time spent with patient: 45 minutes  CLINICAL FACTORS:   Severe Anxiety and/or Agitation Depression:   Impulsivity Severe  COGNITIVE FEATURES THAT CONTRIBUTE TO RISK:  Closed-mindedness Polarized thinking Thought constriction (tunnel vision)    SUICIDE RISK:   Moderate:  Frequent suicidal ideation with limited intensity, and duration, some specificity in terms of plans, no associated intent, good self-control, limited dysphoria/symptomatology, some risk factors present, and identifiable protective factors, including available and accessible social support.  PLAN OF CARE:  Supportive approach/coping skills                               CBT;mindfulness                               Reassess and address his presenting symptoms                               Optimize treatment with psychotropics                                 I certify that inpatient services furnished can reasonably be expected to improve the patient's condition.  Kesleigh Morson A 05/24/2014, 1:29 PM

## 2014-05-24 NOTE — Telephone Encounter (Signed)
Edwin Martinez, Edwin Martinez OVERDOSED on atripla in part due to nighmares he was having  I am clearly going to change him OFF of EFV due to his Suicide attempt, depression and nightmares  I think I will put him on TRIUMEQ as long as clean with his psych drugs. He is HLA B701 negative  He had fair acidosis likely from overdosing on NRTI's  Will see when OK to rechallenge him, asked the stop his Atripla. VL was looking good in early August

## 2014-05-25 ENCOUNTER — Telehealth: Payer: Self-pay | Admitting: Infectious Disease

## 2014-05-25 ENCOUNTER — Other Ambulatory Visit: Payer: Self-pay | Admitting: Infectious Disease

## 2014-05-25 DIAGNOSIS — T50992A Poisoning by other drugs, medicaments and biological substances, intentional self-harm, initial encounter: Secondary | ICD-10-CM

## 2014-05-25 DIAGNOSIS — T50991A Poisoning by other drugs, medicaments and biological substances, accidental (unintentional), initial encounter: Secondary | ICD-10-CM

## 2014-05-25 LAB — COMPREHENSIVE METABOLIC PANEL
ALT: 30 U/L (ref 0–53)
ANION GAP: 11 (ref 5–15)
AST: 24 U/L (ref 0–37)
Albumin: 4 g/dL (ref 3.5–5.2)
Alkaline Phosphatase: 147 U/L — ABNORMAL HIGH (ref 39–117)
BUN: 15 mg/dL (ref 6–23)
CO2: 27 mEq/L (ref 19–32)
Calcium: 9.9 mg/dL (ref 8.4–10.5)
Chloride: 101 mEq/L (ref 96–112)
Creatinine, Ser: 1 mg/dL (ref 0.50–1.35)
GFR calc Af Amer: >90 mL/min (ref >90)
GFR calc non Af Amer: >90 mL/min (ref >90)
Glucose, Bld: 91 mg/dL (ref 70–99)
Potassium: 4 mEq/L (ref 3.7–5.3)
Sodium: 139 mEq/L (ref 137–147)
Total Protein: 7.7 g/dL (ref 6.0–8.3)

## 2014-05-25 MED ORDER — ABACAVIR-DOLUTEGRAVIR-LAMIVUD 600-50-300 MG PO TABS
1.0000 | ORAL_TABLET | Freq: Every day | ORAL | Status: DC
  Filled 2014-05-25: qty 1

## 2014-05-25 MED ORDER — ABACAVIR SULFATE-LAMIVUDINE 600-300 MG PO TABS
1.0000 | ORAL_TABLET | Freq: Every day | ORAL | Status: DC
  Filled 2014-05-25 (×2): qty 1

## 2014-05-25 MED ORDER — ABACAVIR-DOLUTEGRAVIR-LAMIVUD 600-50-300 MG PO TABS: 1.0000 | ORAL_TABLET | Freq: Every day | ORAL | Status: DC

## 2014-05-25 MED ORDER — ABACAVIR SULFATE 300 MG PO TABS
600.0000 mg | ORAL_TABLET | Freq: Every day | ORAL | Status: DC
  Administered 2014-05-25 – 2014-05-26 (×2): 600 mg via ORAL
  Filled 2014-05-25 (×4): qty 2

## 2014-05-25 MED ORDER — LAMIVUDINE 150 MG PO TABS
300.0000 mg | ORAL_TABLET | Freq: Every day | ORAL | Status: DC
  Administered 2014-05-25 – 2014-05-26 (×2): 300 mg via ORAL
  Filled 2014-05-25 (×4): qty 2

## 2014-05-25 MED ORDER — DOLUTEGRAVIR SODIUM 50 MG PO TABS
50.0000 mg | ORAL_TABLET | Freq: Every day | ORAL | Status: DC
  Administered 2014-05-25 – 2014-05-26 (×2): 50 mg via ORAL
  Filled 2014-05-25 (×4): qty 1

## 2014-05-25 NOTE — Telephone Encounter (Signed)
Ok that makes sense I will send that script to [facility]

## 2014-05-25 NOTE — Tx Team (Signed)
  Date: 05/25/2014 9:55 AM  Progress in Treatment:  Attending groups: Yes  Participating in groups: Yes  Taking medication as prescribed: Yes  Tolerating medication: Yes  Family/Significant othe contact made: No, CSW to make contact with identified support Patient understands diagnosis: Yes Discussing patient identified problems/goals with staff: Yes  Medical problems stabilized or resolved: Yes  Denies suicidal/homicidal ideation: Yes Patient has not harmed self or Others: Yes   New problem(s) identified: none currently    Discharge Plan or Barriers: Pt to return home with partner and [name].  Additional comments: 28 y/o male who presents involuntarily s/p OD on HIV medication. Patient states he has been stressed and increasingly depressed recently. Patient states he has been having nightmares about his deceased mother andstates he has been trying to get disability but is having conflict with his lawyer. Patient states she currently lives with his male partner and states it is a health relationship. Patient states he is overwhelmed and feels hopeless. Patient states he also needs a medications adjustment because he states his medications are not working. Patient presents extremely drowsy And forwards little information. Patient skin assessed and patient has not skin issues but has multiple tattoos over whole body. Patient currently endorses SI but verbally contracts for safety. Patient denies HI and denies AVH. Consents obtained, fall safety plan explained and patient verbalized understanding. Belongings secured in locker #32. Food and fluids offered and patient accepted both. Patient escorted and oriented to the unit. Patient offered no additional questions or concerns.   Reason for Continuation of Hospitalization:  Anxiety Depression Medication stabilization Suicidal ideation  Estimated length of stay: 3-5 days  For review of  initial/current patient goals, please see plan of care.   Attendees:  Attendees:  Patient:    Family:    Physician: Dr. Jama Flavors, MD  05/25/2014 9:55 AM  Nursing: Onnie Boer, RN Case manager  05/25/2014 9:55 AM  Clinical Social Worker Lamar Sprinkles, MSW 05/25/2014 9:55 AM  Other: [facility]asion  05/25/2014 9:55 AM  Other:  Jaclynn Major, RN 05/25/2014 9:55 AM  Other: Leighton Parody, RN  05/25/2014 9:55 AM  Other: Corrie Dandy, RN    Chad Cordial, LCSWA MSW

## 2014-05-25 NOTE — Progress Notes (Signed)
[facility] MD Progress Note  05/25/2014 2:51 PM SADLER TESCHNER  MRN:  [id number] Subjective: " I feel much better" Objective: Patient states he feels much better today and is focused on being discharged soon. Patient was seen by ID Specialist, and we discussed case. As atrypla may contribute to/ cause  depression, this medication is not going to be restarted, and alternative antiretrovirals are being tried. Patient states he feels strongly that atrypla contributed to his suicide attempt, his depression, and recent bizarre dreams. As per Dr. Daiva Eves, Atrypla overdose may cause metabolic acidosis, electrolytic issues, and wants to repeat BMP/ follow it. Patient has been going to groups and has been participative in milieu No disruptive behaviors on unit. Sleeping better and no vivid dreams or nightmares.    Diagnosis:  Bipolar Disorder , Depressed, versus Depression secondary to Medical Illness/Medication  Total Time spent with patient: 25 minutes   ADL's: improved   Sleep: Improved  Appetite:  Good  Suicidal Ideation:  Denies  Homicidal Ideation:  Denies  AEB (as evidenced by):  Psychiatric Specialty Exam: Physical Exam  Review of Systems  Constitutional: Negative for fever and chills.  Respiratory: Negative for cough and shortness of breath.   Cardiovascular: Negative for chest pain.  Gastrointestinal: Negative for nausea and vomiting.  Skin: Negative for rash.    Blood pressure 123/89, pulse 73, temperature 98 F (36.7 C), temperature source Oral, resp. rate 16, height  (1.702 m), weight 71.668 kg (158 lb).Body mass index is 24.74 kg/(m^2).  General Appearance: Well Groomed  Patent attorney::  Good  Speech:  Normal Rate  Volume:  Normal  Mood:  improved mood and fuller range of affect  Affect:  Congruent and approriate  Thought Process:  Goal Directed and Linear  Orientation:  Full (Time, Place, and Person)  Thought Content:  no hallucinations, no delusions  Suicidal  Thoughts:  No- denies any suicidal or homicidal ideations.  Homicidal Thoughts:  No  Memory:  NA  Judgement:  Fair  Insight:  Fair  Psychomotor Activity:  Normal  Concentration:  Good  Recall:  Good  Fund of Knowledge:Good  Language: Good  Akathisia:  Negative  Handed:  Right  AIMS (if indicated):     Assets:  Communication Skills Desire for Improvement Social Support  Sleep:  Number of Hours: 4.5   Musculoskeletal: Strength & Muscle Tone: within normal limits Gait & Station: normal Patient leans: Right  Current Medications: Current Facility-Administered Medications  Medication Dose Route Frequency Provider Last Rate Last Dose  . abacavir (ZIAGEN) tablet 600 mg  600 mg Oral QHS Randall Hiss, MD       And  . lamiVUDine (EPIVIR) tablet 300 mg  300 mg Oral QHS Randall Hiss, MD      . acetaminophen (TYLENOL) tablet 650 mg  650 mg Oral Q6H PRN Kerry Hough, PA-C   650 mg at 05/24/14 1547  . busPIRone (BUSPAR) tablet 20 mg  20 mg Oral BID Kerry Hough, PA-C   20 mg at 05/25/14 4098  . chlordiazePOXIDE (LIBRIUM) capsule 25 mg  25 mg Oral Q6H PRN Kerry Hough, PA-C      . divalproex (DEPAKOTE) DR tablet 500 mg  500 mg Oral BID Kerry Hough, PA-C   500 mg at 05/25/14 1191  . dolutegravir (TIVICAY) tablet 50 mg  50 mg Oral QHS Randall Hiss, MD      . hydrOXYzine (ATARAX/VISTARIL) tablet 25 mg  25  mg Oral Q6H PRN Kerry Hough, PA-C   25 mg at 05/25/14 5643  . loperamide (IMODIUM) capsule 2-4 mg  2-4 mg Oral PRN Kerry Hough, PA-C      . multivitamin with minerals tablet 1 tablet  1 tablet Oral Daily Kerry Hough, PA-C   1 tablet at 05/25/14 3295  . ondansetron (ZOFRAN-ODT) disintegrating tablet 4 mg  4 mg Oral Q6H PRN Kerry Hough, PA-C      . thiamine (B-1) injection 100 mg  100 mg Intramuscular Once [employer], PA-C      . thiamine (VITAMIN B-1) tablet 100 mg  100 mg Oral Daily Kerry Hough, PA-C   100 mg at 05/25/14 0835  .  traZODone (DESYREL) tablet 50 mg  50 mg Oral QHS,MR X 1 Kerry Hough, PA-C   50 mg at 05/24/14 2241    Lab Results:  Results for orders placed during the hospital encounter of 05/23/14 (from the past 48 hour(s))  VALPROIC ACID LEVEL     Status: Abnormal   Collection Time    05/24/14  6:20 AM      Result Value Ref Range   Valproic Acid Lvl <10.0 (*) 50.0 - 100.0 ug/mL   Comment: Performed at [facility]    Physical Findings: AIMS: Facial and Oral Movements Muscles of Facial Expression: None, normal Lips and Perioral Area: None, normal Jaw: None, normal Tongue: None, normal,Extremity Movements Upper (arms, wrists, hands, fingers): None, normal Lower (legs, knees, ankles, toes): None, normal, Trunk Movements Neck, shoulders, hips: None, normal, Overall Severity Severity of abnormal movements (highest score from questions above): None, normal Incapacitation due to abnormal movements: None, normal Patient's awareness of abnormal movements (rate only patient's report): No Awareness, Dental Status Current problems with teeth and/or dentures?: No Does patient usually wear dentures?: No  CIWA:  CIWA-Ar Total: 0 COWS:     Assessment:   At this time patient much improved, denies depression, affect is full in range. He is hoping for discharge soon . No suicidal ideations. Patient feels Atrypla contributed to or caused his recent depression and associated symptoms. Appreciate Infectious Disease involvement and we discussed potential for Atrypla to cause depression. ID Specialist following for possible metabolic disturbances post overdose.   Treatment Plan Summary: Daily contact with patient to assess and evaluate symptoms and progress in treatment Medication management See below   Plan: Continue inpatient treatment. HIV meds as recommended /ordered by ID Specialist. Depakote ER 500 mgrs BID Buspar 20 mgrs Daily Consider Discharge over weekend if continues to stabilize.  Review BMP lab results prior to discharge and consider getting clearance from Dr. Daiva Eves ( ID Specialist ) prior to D/C, and refer to Infectious Disease Clinic for ongoing outpatient treatment. Patient plans to follow up at [facility] for ongoing psychiatric treatment.   Medical Decision Making Problem Points:  Established problem, stable/improving (1), Review of last therapy session (1) and Review of psycho-social stressors (1) Data Points:  Review of medication regiment & side effects (2)  I certify that inpatient services furnished can reasonably be expected to improve the patient's condition.   COBOS, FERNANDO 05/25/2014, 2:51 PM

## 2014-05-25 NOTE — Progress Notes (Signed)
D: Pt facial expression animated. Mood and affect anxious. Patient rated depression as 5 on scale of 0 to 10. On patient self-inventory, patient communicated that his goal for today is to "lower depression level" and that "staying happy will help him meet his goal." Patient has also had c/o constipation. A: Support provided through active listening. Medications administered per order. Safety maintained via q 15 minute checks.  R: Patient has attended and participated in groups throughout day. Patient stated that he did have a small bowel movement early this afternoon, but still has c/o constipation. Patient also seen by Dr. Daiva Eves today regarding HIV treatment.

## 2014-05-25 NOTE — Progress Notes (Signed)
Writer spoke with patient 1:1 and he was in a happy mood reporting that he was just peachy today. Patient spoke with Clinical research associate concerning his medications and is aware of the changes made to his medications. He reported that he felt that the Atripla was the cause of him feeling do depressed and suicidal. He is hopeful to discharge on tomorrow. Support and encouragement given, safety maintained on unit with 15 min checks. He plans to follow up with [facility] after discharge.

## 2014-05-25 NOTE — Telephone Encounter (Signed)
Patient will need to pick first prescription up at [facility] location this one time and then the others can be mailed. The last delivery day is tomorrow until next month and since there is not a definite discharge date he won't be home to sign. [place] suggested that this would be the best option to insure that he received his medications when he is discharged.

## 2014-05-25 NOTE — Progress Notes (Signed)
D: Pt is flat in affect but brightens upon interaction. Pt endorses anxiety and depression. Pt is denying any withdrawal symptoms at this time. Pt is denying any SI/HI/AVH. Pt actively participates within the milieu. Pt attended karaoke this evening.  A: Writer administered scheduled and prn medications to pt. Continued support and availability as needed was extended to this pt. Staff continue to monitor pt with q48min checks.  R: No adverse drug reactions noted. Pt receptive to treatment. Pt remains safe at this time.

## 2014-05-25 NOTE — Telephone Encounter (Signed)
CAN WE CALL [facility] AND HAVE THEM DELIVER TRIUMEQ TO [facility] AT THE FOLLOWING ADDRESS   [address]   He is currently inpatient at [facility] for suicide attempt but may be release in the coming days  His adap is renewed

## 2014-05-25 NOTE — Telephone Encounter (Signed)
I am going to strong arm him. Suicide attempt disqualifies Atripla. I am not going to give him that option

## 2014-05-25 NOTE — Consult Note (Signed)
[facility]    Date of Admission:  05/23/2014  Date of Consult:  05/25/2014  Reason for Consult: suicide attempt with overdose of Atripla Referring Physician: Dr. Elna Breslow   HPI: Edwin Martinez is an 28 y.o. male with HIV that has been very well controlled recently and with healthy CD4 count.  Lab Results  Component Value Date   HIV1RNAQUANT 30* 05/09/2014   Lab Results  Component Value Date   CD4TABS 1160 05/09/2014   CD4TABS 1150 02/21/2014   CD4TABS 770 08/10/2013   He apparently had been having nightmares about his deceased mother. He had been on depakote for bipolar disorder and has had prior hospitalizations for suicide attempts including cutting his wrists in 2001, trying to hang himself (mother cut him down and resuscitated him ),   had progressively worsening depression, feelings of worthlessness, anhedonia over the past few months. On admission to the ED he was found to have a profound metabolic acidosis that improved with hydration. He received activated charcoal which is still causing loose stools.  He was started back on Atripla--WHICH IS NOT WHAT I HAD ADVISED ED STAFF to convey to psychiatry.  I am VERY CONCERNED THAT ATRIPLA MAY HAVE BEEN CONTRIBUTING SIGNFICANTLY TO HIS DEPRESSION, BIPOLAR DISORDER AND RECENT SUICIDE ATTEMTS. IT NOW CARRIES WARNING RE HIGHER RISK FOR DEPRESSION AND SUICIDE.      Past Medical History  Diagnosis Date  . HIV (human immunodeficiency virus infection)   . Bipolar 1 disorder   . Schizophrenia   . Asthma     History reviewed. No pertinent past surgical history.ergies:   Allergies  Allergen Reactions  . Atripla [Efavirenz-Emtricitab-Tenofovir] Other (See Comments)    DEPRESSION AND SUICIDE ATTEMPT  . Magnesium-Containing Compounds     MAGNESIUM ,COMPOUNDS CAN REDUCE LEVELS OF DOLUTEGRAVIR SO SHOULD NOT BE COADMIN WITH TIVICAY OR TRIUMEQ  . Esomeprazole Magnesium     REACTION: cough  . Peanuts  [Peanut Oil]     Rash   . Penicillins     REACTION: rash  . Bactrim [Sulfamethoxazole-Tmp Ds] Rash    Patient reported     Medications: I have reviewed patients current medications as documented in Epic Anti-infectives   Start     Dose/Rate Route Frequency Ordered Stop   05/25/14 2200  Abacavir-Dolutegravir-Lamivud 600-50-300 MG TABS 1 tablet  Status:  Discontinued     1 tablet Oral Daily at bedtime 05/25/14 1238 05/25/14 1257   05/25/14 2200  dolutegravir (TIVICAY) tablet 50 mg     50 mg Oral Daily at bedtime 05/25/14 1258     05/25/14 2200  abacavir-lamiVUDine (EPZICOM) 600-300 MG per tablet 1 tablet  Status:  Discontinued     1 tablet Oral Daily at bedtime 05/25/14 1258 05/25/14 1357   05/25/14 2200  abacavir (ZIAGEN) tablet 600 mg     600 mg Oral Daily at bedtime 05/25/14 1357     05/25/14 2200  lamiVUDine (EPIVIR) tablet 300 mg     300 mg Oral Daily at bedtime 05/25/14 1357     05/24/14 2200  efavirenz-emtricitabine-tenofovir (ATRIPLA) 600-200-300 MG per tablet 1 tablet  Status:  Discontinued     1 tablet Oral Daily at bedtime 05/24/14 0900 05/25/14 1222      Social History:  reports that he has been smoking Cigarettes.  He started smoking about 22 years ago. He has been smoking about 0.50 packs per day. He has never used smokeless tobacco. He reports that he drinks about 40  ounces of alcohol per week. He reports that he uses illicit drugs (Marijuana and Cocaine) about twice per week.  Family History  Problem Relation Age of Onset  . Huntington's disease Father   . Heart disease Mother     As in HPI and primary teams notes otherwise 12 point review of systems is negative  Blood pressure 123/89, pulse 73, temperature 98 F (36.7 C), temperature source Oral, resp. rate 16, height  (1.702 m), weight 158 lb (71.668 kg).  General: Alert and awake, oriented x3, not in any acute distress. HEENT: anicteric sclera, CVS regular rate Chest: no wheezing  Abdomen  nondistended Neuro: nonfocal   Results for orders placed during the hospital encounter of 05/23/14 (from the past 48 hour(s))  VALPROIC ACID LEVEL     Status: Abnormal   Collection Time    05/24/14  6:20 AM      Result Value Ref Range   Valproic Acid Lvl <10.0 (*) 50.0 - 100.0 ug/mL   Comment: Performed at [facility]     )No results found for this or any previous visit (from the past 720 hour(s)).   Impression/Recommendation  Active Problems:   MDD (major depressive disorder), recurrent episode, severe   Bipolar I disorder, most recent episode depressed   GAD (generalized anxiety disorder)   Edwin Martinez is a 28 y.o. male with  HIV, and bipolar disorder, mx prior suicide attempts with Atripla overdose  #1 Overdose: Main toxicity evident from his labs was NRTI mitochondrial toxicity and metabolic acidosis. I WOULD NOT have restarted his Atripla last night and further exposed him to Tenofovir and Emtricitabine given he just overdosed on these NRTI's.   I will check CMP to make sure there is not any further recurrence of severe metabolic acidosis or renal disease  #2 HIV: I am dc'ing Atripla and have wrote for [facility]. THis antiviral is ONE PILL once a day and will not interact with his psyhiatry meds. It SHOULD NOT BE TAKEN AT SAME TIME AS MAGNESIUM SALTS, NOR AT SAME TIME AS CALCIUM, IRON OR OTHER CATIONS THAT CAN BIND THE DOLUTEGRAVIR WITHOUT ALSO GIVING A MEAL WITH THESE MEDS  THEREFORE I DC'D HIS MAGNESIUM  I HAVE ADDED ATRIPLA AND MAGNESIUM TO HIS ALLERGIES  UNFORTUNATELY THE HOSPITAL DOES NOT HAVE TRIUMEQ IN STOCK SO WE WILL GIVE COMPONENT PARTS:  TIVICAY AND EPZICOM  HE WILL NEED TO PICKUP HIS FIRST OUTPATIENT SCRIPT IN PERSON AT [facility] ON [address] AND THEN ONCE HE IS HOME AGAIN HE CAN RESUME DELIVERY FROM MAIL ORDER  HE SHOULD FOLLOWUP AND HAVE REPEAT LABS INCLUDING VIRAL LOAD, CD4 IN EARLY October.   I WILL CHECK IN ON HIM OVER THE  WEEKEND   05/25/2014, 2:49 PM   Thank you so much for this interesting consult  [facility] [phone] (pager) [phone] (office) 05/25/2014, 2:49 PM  Paulette Blanch Dam 05/25/2014, 2:49 PM

## 2014-05-25 NOTE — [facility] Group Notes (Signed)
[facility] LCSW Group Therapy 05/25/2014 1:15pm  Type of Therapy: Group Therapy- Feelings Around Relapse and Recovery  Participation Level: Active  Participation Quality:  Intrusive and Monopolizing  Affect:  Appropriate   Cognitive: Alert and Oriented   Insight:  Developing/Improving   Engagement in Therapy: Developing/Improving and Engaged   Modes of Intervention: Clarification, Confrontation, Discussion, Education, Exploration, Limit-setting, Orientation, Problem-solving, Rapport Building, Dance movement psychotherapist, Socialization and Support  Summary of Progress/Problems: The topic for today was feelings about relapse. Pt discussed what relapse prevention is to them and identified triggers that they are on the path to relapse. Pt processed their feeling towards relapse and was able to relate to peers. Pt discussed coping skills that can be used for relapse prevention. Pt spoke about triggers for his depression such as being alone.  Pt described that his depression only sets in when he has time to himself.  Pt explored coping skills and action steps to take when he is alone as to avoid being overwhelmed with negative and depressive thoughts. Although Pt's comments were appropriate and supportive, Pt was monopolizing in group discussion, often talking over peers and CSW.  Pt's insight is improving as he is able to identify triggers and offer advice to other peers.    Therapeutic Modalities:   Cognitive Behavioral Therapy Solution-Focused Therapy Assertiveness Training Relapse Prevention Therapy  Chad Cordial, LCSWA 05/25/2014 2:25 PM

## 2014-05-25 NOTE — [facility] Group Notes (Signed)
[facility] LCSW Aftercare Discharge Planning Group Note  05/25/2014 8:45 AM  Participation Quality: Alert, Appropriate and Oriented  Mood/Affect: Appropriate affect; "great" mood  Depression Rating: 5  Anxiety Rating: 5  Thoughts of Suicide: Pt denies SI/HI  Will you contract for safety? Yes  Current AVH: Pt denies  Plan for Discharge/Comments: Pt attended discharge planning group and actively participated in group. CSW provided pt with today's workbook. Pt has discharge plan in place, will return to his home and follow [facility].  Pt was distracting during group, engaging in multiple side conversations.   Transportation Means: Pt reports access to transportation  Supports: Partner  Chad Cordial, LCSWA 05/25/2014 9:48 AM

## 2014-05-26 DIAGNOSIS — E872 Acidosis, unspecified: Secondary | ICD-10-CM

## 2014-05-26 DIAGNOSIS — F411 Generalized anxiety disorder: Secondary | ICD-10-CM

## 2014-05-26 DIAGNOSIS — X838XXA Intentional self-harm by other specified means, initial encounter: Secondary | ICD-10-CM

## 2014-05-26 DIAGNOSIS — F3130 Bipolar disorder, current episode depressed, mild or moderate severity, unspecified: Secondary | ICD-10-CM

## 2014-05-26 DIAGNOSIS — Z21 Asymptomatic human immunodeficiency virus [HIV] infection status: Secondary | ICD-10-CM

## 2014-05-26 LAB — COMPREHENSIVE METABOLIC PANEL
ALT: 27 U/L (ref 0–53)
AST: 21 U/L (ref 0–37)
Albumin: 4 g/dL (ref 3.5–5.2)
Alkaline Phosphatase: 146 U/L — ABNORMAL HIGH (ref 39–117)
Anion gap: 13 (ref 5–15)
BUN: 18 mg/dL (ref 6–23)
CO2: 26 meq/L (ref 19–32)
CREATININE: 1.08 mg/dL (ref 0.50–1.35)
Calcium: 9.6 mg/dL (ref 8.4–10.5)
Chloride: 98 mEq/L (ref 96–112)
GLUCOSE: 99 mg/dL (ref 70–99)
Potassium: 4.4 mEq/L (ref 3.7–5.3)
Sodium: 137 mEq/L (ref 137–147)
Total Bilirubin: <0.2 mg/dL — ABNORMAL LOW (ref 0.3–1.2)
Total Protein: 7.7 g/dL (ref 6.0–8.3)

## 2014-05-26 NOTE — Progress Notes (Signed)
Pt seen and case discussed with midlevel provider. I agree with the A&P.

## 2014-05-26 NOTE — Plan of Care (Signed)
Problem: Ineffective individual coping Goal: STG: Patient will remain free from self harm Patient has remained free from harm this shift.  Outcome: Progressing Patient has remained free from harm this shift as evidenced by 15 min checks  Problem: Alteration in mood Goal: LTG-Patient reports reduction in suicidal thoughts (Patient reports reduction in suicidal thoughts and is able to verbalize a safety plan for whenever patient is feeling suicidal)  Outcome: Progressing Patient denies SI this shift  Problem: Diagnosis: Increased Risk For Suicide Attempt Goal: STG-Patient Will Attend All Groups On The Unit Outcome: Progressing Patient attended group this evening.

## 2014-05-26 NOTE — Progress Notes (Signed)
Patient ID: Edwin Martinez, male   DOB: 06-14-1986, 28 y.o.   MRN: [id number] [facility] MD Progress Note  05/26/2014 11:14 AM Edwin Martinez  MRN:  [id number] Subjective: " I feel much better and ready to go home"  Objective: Patient was evaluated today for his depressed mood.  Patient reported he is much better today.  Patient states that as soon as his HIV medication was changed he started feeling less depressed.  He denies depression and rated his depressed mood 0/10 but anxiety he rated 4/10 with 10 being severe depression or anxiety.  He reports good sleep and appetite.  Patient states he is participating in groups  and is learning some coping skills.  He denies SI/HI/AVH.  He is looking forward to going home tomorrow or preferably today.  He is calm, cooperative and his mood and affect has improved from what he presented as on the day of admission.  Diagnosis:  Bipolar Disorder , Depressed, versus Depression secondary to Medical Illness/Medication  Total Time spent with patient: 25 minutes   ADL's: improved   Sleep: Improved  Appetite:  Good  Suicidal Ideation:  Denies  Homicidal Ideation:  Denies  AEB (as evidenced by):  Psychiatric Specialty Exam: Physical Exam  Review of Systems  Constitutional: Negative for fever and chills.  Respiratory: Negative for cough and shortness of breath.   Cardiovascular: Negative for chest pain.  Gastrointestinal: Negative for nausea and vomiting.  Skin: Negative for rash.    Blood pressure 123/75, pulse 57, temperature 97.4 F (36.3 C), temperature source Oral, resp. rate 16, height 5' 7"  (1.702 m), weight 71.668 kg (158 lb).Body mass index is 24.74 kg/(m^2).  General Appearance: Well Groomed  Engineer, water::  Good  Speech:  Normal Rate  Volume:  Normal  Mood:  improved mood and fuller range of affect  Affect:  Congruent and approriate  Thought Process:  Goal Directed and Linear  Orientation:  Full (Time, Place, and Person)  Thought  Content:  no hallucinations, no delusions  Suicidal Thoughts:  No- denies any suicidal or homicidal ideations.  Homicidal Thoughts:  No  Memory:  NA  Judgement:  Fair  Insight:  Fair  Psychomotor Activity:  Normal  Concentration:  Good  Recall:  Good  Fund of Knowledge:Good  Language: Good  Akathisia:  Negative  Handed:  Right  AIMS (if indicated):     Assets:  Communication Skills Desire for Improvement Social Support  Sleep:  Number of Hours: 6.5   Musculoskeletal: Strength & Muscle Tone: within normal limits Gait & Station: normal Patient leans: Right  Current Medications: Current Facility-Administered Medications  Medication Dose Route Frequency Provider Last Rate Last Dose  . abacavir (ZIAGEN) tablet 600 mg  600 mg Oral QHS Truman Hayward, MD   600 mg at 05/25/14 2117   And  . lamiVUDine (EPIVIR) tablet 300 mg  300 mg Oral QHS Truman Hayward, MD   300 mg at 05/25/14 2116  . acetaminophen (TYLENOL) tablet 650 mg  650 mg Oral Q6H PRN Laverle Hobby, PA-C   650 mg at 05/24/14 1547  . busPIRone (BUSPAR) tablet 20 mg  20 mg Oral BID Laverle Hobby, PA-C   20 mg at 05/26/14 8003  . chlordiazePOXIDE (LIBRIUM) capsule 25 mg  25 mg Oral Q6H PRN Laverle Hobby, PA-C      . divalproex (DEPAKOTE) DR tablet 500 mg  500 mg Oral BID Laverle Hobby, PA-C   500 mg at  05/26/14 0904  . dolutegravir (TIVICAY) tablet 50 mg  50 mg Oral QHS Truman Hayward, MD   50 mg at 05/25/14 2116  . hydrOXYzine (ATARAX/VISTARIL) tablet 25 mg  25 mg Oral Q6H PRN Laverle Hobby, PA-C   25 mg at 05/25/14 4268  . loperamide (IMODIUM) capsule 2-4 mg  2-4 mg Oral PRN Laverle Hobby, PA-C      . multivitamin with minerals tablet 1 tablet  1 tablet Oral Daily Laverle Hobby, PA-C   1 tablet at 05/26/14 3419  . ondansetron (ZOFRAN-ODT) disintegrating tablet 4 mg  4 mg Oral Q6H PRN Laverle Hobby, PA-C      . thiamine (B-1) injection 100 mg  100 mg Intramuscular Once [employer], PA-C       . thiamine (VITAMIN B-1) tablet 100 mg  100 mg Oral Daily Laverle Hobby, PA-C   100 mg at 05/26/14 0904  . traZODone (DESYREL) tablet 50 mg  50 mg Oral QHS,MR X 1 Laverle Hobby, PA-C   50 mg at 05/25/14 2222    Lab Results:  Results for orders placed during the hospital encounter of 05/23/14 (from the past 48 hour(s))  COMPREHENSIVE METABOLIC PANEL     Status: Abnormal   Collection Time    05/25/14  7:16 PM      Result Value Ref Range   Sodium 139  137 - 147 mEq/L   Potassium 4.0  3.7 - 5.3 mEq/L   Chloride 101  96 - 112 mEq/L   CO2 27  19 - 32 mEq/L   Glucose, Bld 91  70 - 99 mg/dL   BUN 15  6 - 23 mg/dL   Creatinine, Ser 1.00  0.50 - 1.35 mg/dL   Calcium 9.9  8.4 - 10.5 mg/dL   Total Protein 7.7  6.0 - 8.3 g/dL   Albumin 4.0  3.5 - 5.2 g/dL   AST 24  0 - 37 U/L   ALT 30  0 - 53 U/L   Alkaline Phosphatase 147 (*) 39 - 117 U/L   Total Bilirubin <0.2 (*) 0.3 - 1.2 mg/dL   GFR calc non Af Amer >90  >90 mL/min   GFR calc Af Amer >90  >90 mL/min   Comment: (NOTE)     The eGFR has been calculated using the CKD EPI equation.     This calculation has not been validated in all clinical situations.     eGFR's persistently <90 mL/min signify possible Chronic Kidney     Disease.   Anion gap 11  5 - 15   Comment: Performed at [facility] PANEL     Status: Abnormal   Collection Time    05/26/14  6:30 AM      Result Value Ref Range   Sodium 137  137 - 147 mEq/L   Potassium 4.4  3.7 - 5.3 mEq/L   Chloride 98  96 - 112 mEq/L   CO2 26  19 - 32 mEq/L   Glucose, Bld 99  70 - 99 mg/dL   BUN 18  6 - 23 mg/dL   Creatinine, Ser 1.08  0.50 - 1.35 mg/dL   Calcium 9.6  8.4 - 10.5 mg/dL   Total Protein 7.7  6.0 - 8.3 g/dL   Albumin 4.0  3.5 - 5.2 g/dL   AST 21  0 - 37 U/L   ALT 27  0 - 53 U/L   Alkaline  Phosphatase 146 (*) 39 - 117 U/L   Total Bilirubin <0.2 (*) 0.3 - 1.2 mg/dL   GFR calc non Af Amer >90  >90 mL/min   GFR calc Af Amer >90   >90 mL/min   Comment: (NOTE)     The eGFR has been calculated using the CKD EPI equation.     This calculation has not been validated in all clinical situations.     eGFR's persistently <90 mL/min signify possible Chronic Kidney     Disease.   Anion gap 13  5 - 15   Comment: Performed at [facility]    Physical Findings: AIMS: Facial and Oral Movements Muscles of Facial Expression: None, normal Lips and Perioral Area: None, normal Jaw: None, normal Tongue: None, normal,Extremity Movements Upper (arms, wrists, hands, fingers): None, normal Lower (legs, knees, ankles, toes): None, normal, Trunk Movements Neck, shoulders, hips: None, normal, Overall Severity Severity of abnormal movements (highest score from questions above): None, normal Incapacitation due to abnormal movements: None, normal Patient's awareness of abnormal movements (rate only patient's report): No Awareness, Dental Status Current problems with teeth and/or dentures?: No Does patient usually wear dentures?: No  CIWA:  CIWA-Ar Total: 0 COWS:     Assessment:   Patient is happy at his improvement, denies feeling  depressed, participates in group and is looking forward to his discharge. Treatment Plan Summary: Daily contact with patient to assess and evaluate symptoms and progress in treatment Medication management See below   Plan: Continue inpatient treatment. HIV meds as recommended /ordered by ID Specialist. Depakote ER 500 mgrs BID Buspar 20 mgrs Daily Patient plans to follow his infectious disease doctor and [name] on discharge home He will be moving back to the home he shares with his partner on discharge.    Medical Decision Making Problem Points:  Established problem, stable/improving (1), Review of last therapy session (1) and Review of psycho-social stressors (1) Data Points:  Review of medication regiment & side effects (2)  I certify that inpatient services furnished can  reasonably be expected to improve the patient's condition.   Charmaine Downs, C    PMHNP-BC 05/26/2014, 11:14 AM

## 2014-05-26 NOTE — Progress Notes (Signed)
[facility]    Subjective: Wants to go home and upset he cannot go home today   Antibiotics:  Anti-infectives   Start     Dose/Rate Route Frequency Ordered Stop   05/25/14 2200  Abacavir-Dolutegravir-Lamivud 600-50-300 MG TABS 1 tablet  Status:  Discontinued     1 tablet Oral Daily at bedtime 05/25/14 1238 05/25/14 1257   05/25/14 2200  dolutegravir (TIVICAY) tablet 50 mg     50 mg Oral Daily at bedtime 05/25/14 1258     05/25/14 2200  abacavir-lamiVUDine (EPZICOM) 600-300 MG per tablet 1 tablet  Status:  Discontinued     1 tablet Oral Daily at bedtime 05/25/14 1258 05/25/14 1357   05/25/14 2200  abacavir (ZIAGEN) tablet 600 mg     600 mg Oral Daily at bedtime 05/25/14 1357     05/25/14 2200  lamiVUDine (EPIVIR) tablet 300 mg     300 mg Oral Daily at bedtime 05/25/14 1357     05/24/14 2200  efavirenz-emtricitabine-tenofovir (ATRIPLA) 600-200-300 MG per tablet 1 tablet  Status:  Discontinued     1 tablet Oral Daily at bedtime 05/24/14 0900 05/25/14 1222      Medications: Scheduled Meds: . abacavir  600 mg Oral QHS   And  . lamiVUDine  300 mg Oral QHS  . busPIRone  20 mg Oral BID  . divalproex  500 mg Oral BID  . dolutegravir  50 mg Oral QHS  . multivitamin with minerals  1 tablet Oral Daily  . thiamine  100 mg Intramuscular Once  . thiamine  100 mg Oral Daily  . traZODone  50 mg Oral QHS,MR X 1   Continuous Infusions:  PRN Meds:.acetaminophen, chlordiazePOXIDE, hydrOXYzine, loperamide, ondansetron    Objective: Weight change:  No intake or output data in the 24 hours ending 05/26/14 1939 Blood pressure 123/75, pulse 57, temperature 97.4 F (36.3 C), temperature source Oral, resp. rate 16, height  (1.702 m), weight 158 lb (71.668 kg). Temp:  [97.4 F (36.3 C)] 97.4 F (36.3 C) (08/29 0630) Pulse Rate:  [51-57] 57 (08/29 0631) Resp:  [16] 16 (08/29 0630) BP: (123-126)/(75) 123/75 mmHg (08/29 0631)  Physical Exam: General: Alert  and awake, oriented x3, not in any acute distress. Neuro: nonfocal  Psych: was agitated and upset when he found out he could not leave today.   I had come to explain how he should obtain his new meds from [facility] on [address] on [address] and then via the Mail order pharmacy when he was no longer an inpatient but he then seemd to turn this into another reason he needed to be dc. I believe he calmed down quite a bit after I left.  CBC:  Recent Labs Lab 05/23/14 0626  HGB 14.6  HCT 43.1  PLT 229     BMET  Recent Labs  05/25/14 1916 05/26/14 0630  NA 139 137  K 4.0 4.4  CL 101 98  CO2 27 26  GLUCOSE 91 99  BUN 15 18  CREATININE 1.00 1.08  CALCIUM 9.9 9.6     Liver Panel   Recent Labs  05/25/14 1916 05/26/14 0630  PROT 7.7 7.7  ALBUMIN 4.0 4.0  AST 24 21  ALT 30 27  ALKPHOS 147* 146*  BILITOT <0.2* <0.2*       Sedimentation Rate No results found for this basename: ESRSEDRATE,  in the last 72 hours C-Reactive Protein No results found for this basename: CRP,  in  the last 72 hours  Micro Results: No results found for this or any previous visit (from the past 720 hour(s)).  Studies/Results: No results found.    Assessment/Plan:  Active Problems:   MDD (major depressive disorder), recurrent episode, severe   Bipolar I disorder, most recent episode depressed   GAD (generalized anxiety disorder)    Edwin Martinez is a 28 y.o. male with bipolar mx suicide attempts and recent overdose with Atripla with metabolic acidosis. I have switched his ARVs and he is now on Tivicay and Epzicom (we did not have TRIUMEQ in house) and will be on TRIUMEQ at home.   #1 HIV: continue Tivicay/Epzicom in house (components of TRIUMEQ) I have e prescribed his TRIUMEQ to [facility] on [address] where he could pick up a months supply at [place] from [facility]  He can then call [facility] Mail order to get further refills shipped to his home  I would like him to come back to our clinic  for recheck VL and CD4 on his new regimen in Early October  #2 AGAP metabolic acidosis : due to TNF/EMT overdose, resolved  #3 Suicide attempt: certainly the Efavirenz in Atripla may have been playing a signficant role in his depression and suicidality but I dout it is the ONLY reason. I am glad we have finally gotten him off of this medicine  I will arrange for HSFU in our clinic     LOS: 3 days   Acey Lav 05/26/2014, 7:39 PM

## 2014-05-26 NOTE — [facility] Group Notes (Signed)
[facility] Group Notes:  (Clinical Social Work)  05/26/2014   1:15-2:15PM  Summary of Progress/Problems:   The main focus of today's process group was for the patient to identify ways in which they have sabotaged their own mental health wellness/recovery.  Motivational interviewing and a handout were used to explore the benefits and costs of their self-sabotaging behavior as well as the benefits and costs of changing this behavior.  The Stages of Change were explained to the group using a handout, and patients identified where they are with regard to changing self-defeating behaviors.  The patient expressed himself well during group, and was less monopolizing than previously.  Type of Therapy:  Process Group  Participation Level:  Active  Participation Quality:  Attentive  Affect:  Blunted  Cognitive:  Oriented  Insight:  Developing/Improving  Engagement in Therapy:  Engaged  Modes of Intervention:  Education, Motivational Interviewing   Ambrose Mantle, LCSW 05/26/2014, 4:00pm

## 2014-05-26 NOTE — Progress Notes (Signed)
[name] has had a hard time today..he started the day off wanting to " go home.Marland Kitchenam I going to go home today.Marland Kitchen???" and he got agitated when this nurse explained to him DC process in this hospital...and that he was not expected to leave and that we needed a firm DC plan, and psychiatrist DC order. After he calmed down, he spent the day attending his goups and interacting with his peers. He takes his medications as planned and . He is trying to understand his unhealthy behaviors and how they relate to his illness and his problems.   A HE completed his morning assessment and on it he wrote he denied SI within the past 24 hrs and he rated hsi depression, hopelessness and anxiety " 0/0/5 "    R Safety is in place and poc cont.

## 2014-05-26 NOTE — Progress Notes (Signed)
The patient attended the wrap-up group, but sat with his head covered up with a blanket and had nothing to share.

## 2014-05-27 DIAGNOSIS — F332 Major depressive disorder, recurrent severe without psychotic features: Principal | ICD-10-CM

## 2014-05-27 MED ORDER — BUSPIRONE HCL 10 MG PO TABS: 20.0000 mg | ORAL_TABLET | Freq: Two times a day (BID) | ORAL | Status: DC

## 2014-05-27 MED ORDER — ABACAVIR-DOLUTEGRAVIR-LAMIVUD 600-50-300 MG PO TABS: 1.0000 | ORAL_TABLET | Freq: Every day | ORAL | Status: DC

## 2014-05-27 MED ORDER — DIVALPROEX SODIUM 500 MG PO DR TAB: 500.0000 mg | DELAYED_RELEASE_TABLET | Freq: Two times a day (BID) | ORAL | Status: DC

## 2014-05-27 MED ORDER — TRAZODONE HCL 50 MG PO TABS: 50.0000 mg | ORAL_TABLET | Freq: Every evening | ORAL | Status: DC | PRN

## 2014-05-27 MED ORDER — EFAVIRENZ-EMTRICITAB-TENOFOVIR 600-200-300 MG PO TABS: ORAL_TABLET | ORAL | Status: DC

## 2014-05-27 MED ORDER — DOLUTEGRAVIR SODIUM 50 MG PO TABS: 50.0000 mg | ORAL_TABLET | Freq: Every day | ORAL | Status: DC

## 2014-05-27 NOTE — Progress Notes (Signed)
Patient requested to talk to writer shortly after shift report. He was upset reporting that he was supposed to discharge on Saturday and didn't understand why he was still here. Patient referred to another patient that was to discharge but writer asked him to speak on his situation only. He requested a visteril for his anxiety and writer encouraged him to speak to the doctor on tomorrow concerning his discharge. Patient seems to like to create an audience while discussing this issue and Clinical research associate asked that he lower his tone. Patient calmed down slightly. He denies si/hi/a/v hallucinations. Supoprt and encouragement given.

## 2014-05-27 NOTE — [facility] Suicide Risk Assessment (Signed)
  States he has calmed down today. Yesterday he was upset b/c he wanted to be d/c. Sleep, appetite and energy are good. Depression is resolved but anxiety is high (6/10) b/c of d/c. Denies SI/HI, AVH. Pt compliant with meds and denies SE.    Demographic Factors:  Male, Abner Greenspan, lesbian, or bisexual orientation, Low socioeconomic status and Unemployed  Total Time spent with patient: 30 minutes  Psychiatric Specialty Exam: Physical Exam  Psychiatric: He has a normal mood and affect. His speech is normal and behavior is normal. Judgment and thought content normal.    ROS  Blood pressure 123/75, pulse 57, temperature 97.4 F (36.3 C), temperature source Oral, resp. rate 16, height $RemoveBe'5\' 7"'[id number]$  (1.702 m), weight 71.668 kg (158 lb).Body mass index is 24.74 kg/(m^2).  General Appearance: Casual  Eye Contact::  Good  Speech:  Clear and Coherent and Normal Rate  Volume:  Normal  Mood:  Euthymic  Affect:  Full Range  Thought Process:  Goal Directed, Linear and Logical  Orientation:  Full (Time, Place, and Person)  Thought Content:  Negative  Suicidal Thoughts:  No  Homicidal Thoughts:  No  Memory:  NA  Judgement:  Good  Insight:  Good  Psychomotor Activity:  Normal  Concentration:  Good  Recall:  Good  Fund of Knowledge:Good  Language: Good  Akathisia:  No  Handed:  Right  AIMS (if indicated):     Assets:  Communication Skills Desire for Improvement Housing Intimacy Social Support  Sleep:  Number of Hours: 6.75    Musculoskeletal: Strength & Muscle Tone: within normal limits Gait & Station: normal Patient leans: N/A   Mental Status Per Nursing Assessment::   On Admission:  Suicidal ideation indicated by patient;Suicide plan;Self-harm thoughts  Current Mental Status by Physician: NA  Loss Factors: Decline in physical health  Historical Factors: Prior suicide attempts and Impulsivity  Risk Reduction Factors:   Sense of responsibility to family, Living with another person,  especially a relative and Positive social support  Continued Clinical Symptoms:  Medical Diagnoses and Treatments/Surgeries  Cognitive Features That Contribute To Risk:  N/A  Suicide Risk:  Minimal: No identifiable suicidal ideation.  Patients presenting with no risk factors but with morbid ruminations; may be classified as minimal risk based on the severity of the depressive symptoms  Discharge Diagnoses:  AXIS I:  Bipolar, Depressed versus Depression secondary to Medical Illness/Medication  AXIS II:  Deferred AXIS III:   Past Medical History  Diagnosis Date  . HIV (human immunodeficiency virus infection)   . Bipolar 1 disorder   . Schizophrenia   . Asthma    AXIS IV:  other psychosocial or environmental problems and decline in health AXIS V:  61-70 mild symptoms  Plan Of Care/Follow-up recommendations:  Activity:  as tolerated Diet:  normal Other:  f/up with [name] Reviewed labs- K WNL, Alk phos elevated  Is patient on multiple antipsychotic therapies at discharge:  No   Has Patient had three or more failed trials of antipsychotic monotherapy by history:  No  Recommended Plan for Multiple Antipsychotic Therapies: NA    Mushka Laconte 05/27/2014, 9:25 AM

## 2014-05-27 NOTE — Progress Notes (Addendum)
Pt was discharged and was given a bus pass by AC,Eric. Pt stated he felt so much better and that he has a good support system. He did state his mom died in her sleep by a massive heart attack and his dad died from Huntingdon's disease. Pt does contract for safety and denies SI and HI. He was given prescriptions and all follow up information. Pt did get all his belongings out of his locker and appears in very good spirits. Addendum: Pt completed his daily self assessment earlier today and on it he denied SI within the past 24 hts and he rated his depression and hoepelssness and anxiety " 0/0/6". Pt is dc'd home amb per MD Order.

## 2014-05-27 NOTE — Progress Notes (Signed)
[facility] Adult Case Management Discharge Plan :  Will you be returning to the same living situation after discharge: Yes,  to live with partner At discharge, do you have transportation home?:Yes,  arranged Do you have the ability to pay for your medications:Yes,  discussed and no problem indicated  Release of information consent forms completed and in the chart;  Patient's signature needed at discharge.  Patient to Follow up at: Follow-up Information   Follow up with [facility] -   [facility] On 05/31/2014. (May 31, 2014 at 11 AM)    Contact information:   [address] [place], [place]   [address]  [phone]      Follow up with [facility] On 05/29/2014. (Please go to [name]'s walk in clinic on Tuesday, May 29, 2014 or any weekday between 8AM - 3PM for medication Managment)    Contact information:   [address] [place], [place]   [address]   [phone]      Patient denies SI/HI:   Yes,  adamantly    Safety Planning and Suicide Prevention discussed:  No.    Sarina Ser 05/27/2014, 5:33 PM

## 2014-05-27 NOTE — Discharge Summary (Signed)
Physician Discharge Summary Note  Patient:  Edwin Martinez is an 28 y.o., male MRN:  [id number] DOB:  06-27-1986 Patient phone:  [phone] (home)  Patient address:   [place] [address],  Total Time spent with patient: Greater than 30 minutes  Date of Admission:  05/23/2014 Date of Discharge: 05/27/14  Reason for Admission:  Mood stabilization  Discharge Diagnoses: Active Problems:   MDD (major depressive disorder), recurrent episode, severe   Bipolar I disorder, most recent episode depressed   GAD (generalized anxiety disorder)  Psychiatric Specialty Exam: Physical Exam  Psychiatric: His speech is normal and behavior is normal. Judgment and thought content normal. His mood appears not anxious. His affect is not angry, not blunt, not labile and not inappropriate. Cognition and memory are normal. He does not exhibit a depressed mood.    Review of Systems  Constitutional: Negative.   HENT: Negative.   Eyes: Negative.   Respiratory: Negative.   Cardiovascular: Negative.   Gastrointestinal: Negative.   Genitourinary: Negative.   Musculoskeletal: Negative.   Skin: Negative.   Neurological: Negative.   Endo/Heme/Allergies: Negative.   Psychiatric/Behavioral: Positive for depression (Stable). Negative for suicidal ideas, hallucinations, memory loss and substance abuse. The patient has insomnia (Stable). The patient is not nervous/anxious.     Blood pressure 126/51, pulse 50, temperature 97.9 F (36.6 C), temperature source Oral, resp. rate 16, height 5' 7"  (1.702 m), weight 71.668 kg (158 lb).Body mass index is 24.74 kg/(m^2).   General Appearance: Casual   Eye Contact:: Good   Speech: Clear and Coherent and Normal Rate   Volume: Normal   Mood: Euthymic   Affect: Full Range   Thought Process: Goal Directed, Linear and Logical   Orientation: Full (Time, Place, and Person)   Thought Content: Negative   Suicidal Thoughts: No   Homicidal Thoughts: No    Memory: NA   Judgement: Good   Insight: Good   Psychomotor Activity: Normal   Concentration: Good   Recall: Good   Fund of Knowledge:Good   Language: Good   Akathisia: No   Handed: Right   AIMS (if indicated):   Assets: Communication Skills  Desire for Improvement  Housing  Intimacy  Social Support   Sleep: Number of Hours: 6.75    Past Psychiatric History: Diagnosis: MDD (major depressive disorder), recurrent episode, severe, Bipolar affective disorder, Generalized anxiety disorder.  Hospitalizations: [facility] ADULT UNIT  Outpatient Care: [facility] with Letitia Neri, [facility] clinic  Substance Abuse Care: NA  Self-Mutilation: NA  Suicidal Attempts: NA  Violent Behaviors: NA   Musculoskeletal: Strength & Muscle Tone: within normal limits Gait & Station: normal Patient leans: N/A  DSM5: Schizophrenia Disorders:  NA Obsessive-Compulsive Disorders:  NA Trauma-Stressor Disorders:  NA Substance/Addictive Disorders:  NA Depressive Disorders:  MDD (major depressive disorder), recurrent episode, severe  Axis Diagnosis:  AXIS I:  MDD (major depressive disorder), recurrent episode, severe AXIS II:  Deferred AXIS III:   Past Medical History  Diagnosis Date  . HIV (human immunodeficiency virus infection)   . Bipolar 1 disorder   . Schizophrenia   . Asthma    AXIS IV:  other psychosocial or environmental problems and mental illness, chroni AXIS V:  64  Level of Care:  OP  Hospital Course:  28 Y/O male who states "my medications are imbalance, Buspar is not doing anything for "my anxiety," depression." Has not been on any medications for depression. The depression has been going on "most of my life,  every time since I was a teen." states he went to [place] in 12/16/99 and was diagnosed with Bipolar. In 16-Dec-2007 his father died (Huntingtons disease) his mother died 27 months later. States at that particular time his brother was incarcerated so he had to take care of things  all by himself. States he has been having dreams about his dead mother. States he usually does not drink. He has gotten increasingly more derpssed.  Dino was admitted to the hospital with a blood alcohol level of 26 per toxicology tests reports and his UDS test reports was positive for cocaine. He was also complaining of mood instability/high anxiety levels as he believed his current medications were not controlling his symptoms. He required alcohol detoxification and well as mood stabilization treatments. Donyae also is battling other chronic medical conditions and lacked support system. His detoxification treatments was achieved using [facility] detox protocols.  Besides the detoxification treatments, He was also medicated and discharged on Buspar 10 mg three times daily for anxiety, Depakote 500 mg twice daily for mood stabilization and Trazodone 50 mg Q bedtime for insomnia. He was resumed on all his pertinent home medications for his other pre-existing medical issues that he presented. He tolerated his treatment regimen without any significant adverse effects and or reactions. Linken was also enrolled and participated in the group counseling sessions and AA/NA meetings being offered and held on this unit. He learned coping skills that should help him cope better after discharge.  [place] was motivated for recovery. He worked closely with the treatment team and case managers to develop a discharge plan with appropriate goals. Coping skills, problem solving as well as relaxation therapies were also part of the unit programming. He completed detox treatment and his mood is also stabilized. This is evidenced by his reports of improved mood, absence of suicidal ideations and or withdrawals symptoms. Upon discharge he was in much improved condition than upon admission.  Symptoms were reported as significantly decreased or resolved completely. He currently denies any SI/HI, AVH, delusional thoughts and or  paranoia. He was motivated to continue taking medications with a goal of continued improvement in mental health.  He will follow-up care at the [facility] clinic for medication management/routine psychiatric care. And for counseling services, he will [facility]. He was provided with all the pertinent information required to make this appointment without problems. Transportation per city bus. [place] provided bus pass.  Consults:  psychiatry and Infectious disease Md consults (see notes).  Significant Diagnostic Studies:  labs: CBC with diff, CMP, UDS, Toxicology tests, U/A, Depakote levels  Discharge Vitals:   Blood pressure 126/51, pulse 50, temperature 97.9 F (36.6 C), temperature source Oral, resp. rate 16, height 5' 7"  (1.702 m), weight 71.668 kg (158 lb). Body mass index is 24.74 kg/(m^2). Lab Results:   Results for orders placed during the hospital encounter of 05/23/14 (from the past 72 hour(s))  COMPREHENSIVE METABOLIC PANEL     Status: Abnormal   Collection Time    05/25/14  7:16 PM      Result Value Ref Range   Sodium 139  137 - 147 mEq/L   Potassium 4.0  3.7 - 5.3 mEq/L   Chloride 101  96 - 112 mEq/L   CO2 27  19 - 32 mEq/L   Glucose, Bld 91  70 - 99 mg/dL   BUN 15  6 - 23 mg/dL   Creatinine, Ser 1.00  0.50 - 1.35 mg/dL  Calcium 9.9  8.4 - 10.5 mg/dL   Total Protein 7.7  6.0 - 8.3 g/dL   Albumin 4.0  3.5 - 5.2 g/dL   AST 24  0 - 37 U/L   ALT 30  0 - 53 U/L   Alkaline Phosphatase 147 (*) 39 - 117 U/L   Total Bilirubin <0.2 (*) 0.3 - 1.2 mg/dL   GFR calc non Af Amer >90  >90 mL/min   GFR calc Af Amer >90  >90 mL/min   Comment: (NOTE)     The eGFR has been calculated using the CKD EPI equation.     This calculation has not been validated in all clinical situations.     eGFR's persistently <90 mL/min signify possible Chronic Kidney     Disease.   Anion gap 11  5 - 15   Comment: Performed at [facility] PANEL     Status: Abnormal   Collection Time    05/26/14  6:30 AM      Result Value Ref Range   Sodium 137  137 - 147 mEq/L   Potassium 4.4  3.7 - 5.3 mEq/L   Chloride 98  96 - 112 mEq/L   CO2 26  19 - 32 mEq/L   Glucose, Bld 99  70 - 99 mg/dL   BUN 18  6 - 23 mg/dL   Creatinine, Ser 1.08  0.50 - 1.35 mg/dL   Calcium 9.6  8.4 - 10.5 mg/dL   Total Protein 7.7  6.0 - 8.3 g/dL   Albumin 4.0  3.5 - 5.2 g/dL   AST 21  0 - 37 U/L   ALT 27  0 - 53 U/L   Alkaline Phosphatase 146 (*) 39 - 117 U/L   Total Bilirubin <0.2 (*) 0.3 - 1.2 mg/dL   GFR calc non Af Amer >90  >90 mL/min   GFR calc Af Amer >90  >90 mL/min   Comment: (NOTE)     The eGFR has been calculated using the CKD EPI equation.     This calculation has not been validated in all clinical situations.     eGFR's persistently <90 mL/min signify possible Chronic Kidney     Disease.   Anion gap 13  5 - 15   Comment: Performed at [facility]    Physical Findings: AIMS: Facial and Oral Movements Muscles of Facial Expression: None, normal Lips and Perioral Area: None, normal Jaw: None, normal Tongue: None, normal,Extremity Movements Upper (arms, wrists, hands, fingers): None, normal Lower (legs, knees, ankles, toes): None, normal, Trunk Movements Neck, shoulders, hips: None, normal, Overall Severity Severity of abnormal movements (highest score from questions above): None, normal Incapacitation due to abnormal movements: None, normal Patient's awareness of abnormal movements (rate only patient's report): No Awareness, Dental Status Current problems with teeth and/or dentures?: No Does patient usually wear dentures?: No  CIWA:  CIWA-Ar Total: 0 COWS:     Psychiatric Specialty Exam: See Psychiatric Specialty Exam and Suicide Risk Assessment completed by Attending Physician prior to discharge.  Discharge destination:  Home  Is patient on multiple antipsychotic therapies at  discharge:  No   Has Patient had three or more failed trials of antipsychotic monotherapy by history:  No  Recommended Plan for Multiple Antipsychotic Therapies: NA    Medication List       Indication   Abacavir-Dolutegravir-Lamivud 600-50-300 MG Tabs  Commonly known as:  TRIUMEQ  Take 1 tablet by mouth daily.  For HIV infection   Indication:  HIV Disease     busPIRone 10 MG tablet  Commonly known as:  BUSPAR  Take 2 tablets (20 mg total) by mouth 2 (two) times daily. For anxiety   Indication:  Generalized Anxiety Disorder     divalproex 500 MG DR tablet  Commonly known as:  DEPAKOTE  Take 1 tablet (500 mg total) by mouth 2 (two) times daily. For mood stabilization   Indication:  Mood Stabilization     dolutegravir 50 MG tablet  Commonly known as:  TIVICAY  Take 1 tablet (50 mg total) by mouth at bedtime. For HIV infection   Indication:  HIV Disease     efavirenz-emtricitabine-tenofovir 600-200-300 MG per tablet  Commonly known as:  ATRIPLA  TAKE 1 TABLET BY MOUTH EVERY NIGHT AT BEDTIME: For HIV infection   Indication:  HIV Disease     traZODone 50 MG tablet  Commonly known as:  DESYREL  Take 1 tablet (50 mg total) by mouth at bedtime and may repeat dose one time if needed. For sleep   Indication:  Trouble Sleeping       Follow-up Information   Follow up with [facility] On 05/31/2014. (May 31, 2014 at 11 AM)    Contact information:   [address], [place]   [address]  [phone]      Follow up with [facility] On 05/29/2014. (Please go to [name]'s walk in clinic on Tuesday, May 29, 2014 or any weekday between [place] for medication Managment)    Contact information:   [address] [place],    [address]   [phone]     Follow-up recommendations:  Activity:  As tolerated Diet: As recommended by your primary care doctor. Keep all scheduled follow-up appointments as recommended.  Comments:  Take all  your medications as prescribed by your mental healthcare provider. Report any adverse effects and or reactions from your medicines to your outpatient provider promptly. Patient is instructed and cautioned to not engage in alcohol and or illegal drug use while on prescription medicines. In the event of worsening symptoms, patient is instructed to call the crisis hotline, 911 and or go to the nearest ED for appropriate evaluation and treatment of symptoms. Follow-up with your primary care provider for your other medical issues, concerns and or health care needs.   Total Discharge Time:  Greater than 30 minutes.  Signed: Lindell Spar I, PMHNP-BC 05/27/2014, 2:56 PM

## 2014-05-27 NOTE — Plan of Care (Signed)
Problem: Ineffective individual coping Goal: STG: Patient will remain free from self harm Patient has remained free from harm this shift.  Outcome: Progressing Patient has remained free from harm  Problem: Alteration in mood Goal: STG-Patient reports thoughts of self-harm to staff Outcome: Progressing Patient agrees to report to staff thoughts of harming selk

## 2014-05-27 NOTE — Discharge Summary (Signed)
I saw this patient face to face. I discussed the information with the midlevel provider and I have reviewed the note and agree.

## 2014-05-27 NOTE — [facility] Group Notes (Signed)
[facility] Group Notes: (Clinical Social Work)   05/27/2014      Type of Therapy:  Group Therapy   Participation Level:  Did Not Attend    Ambrose Mantle, LCSW 05/27/2014, 5:29 PM

## 2014-05-29 ENCOUNTER — Telehealth: Payer: Self-pay | Admitting: Licensed Clinical Social Worker

## 2014-05-29 NOTE — Telephone Encounter (Signed)
I called patient and left a message with the partner for him to call me when he had a chance.   Dr. Daiva Eves wanted to know if he started his Triumeq, and also to see if he still has some depression and would like to see a counselor.

## 2014-05-30 NOTE — Progress Notes (Signed)
Patient Discharge Instructions:  After Visit Summary (AVS):   Faxed to:  05/30/14 Discharge Summary Note:   Faxed to:  05/30/14 Psychiatric Admission Assessment Note:   Faxed to:  05/30/14 Suicide Risk Assessment - Discharge Assessment:   Faxed to:  05/30/14 Faxed/Sent to the Next Level Care provider:  05/30/14 Faxed to [facility] @ [phone] Faxed to [facility] @ [phone]  Jerelene Redden, 05/30/2014, 3:35 PM

## 2014-07-10 ENCOUNTER — Other Ambulatory Visit (INDEPENDENT_AMBULATORY_CARE_PROVIDER_SITE_OTHER): Payer: Self-pay

## 2014-07-10 ENCOUNTER — Other Ambulatory Visit: Payer: Self-pay | Admitting: *Deleted

## 2014-07-10 DIAGNOSIS — B20 Human immunodeficiency virus [HIV] disease: Secondary | ICD-10-CM

## 2014-07-10 DIAGNOSIS — Z113 Encounter for screening for infections with a predominantly sexual mode of transmission: Secondary | ICD-10-CM

## 2014-07-10 MED ORDER — EFAVIRENZ-EMTRICITAB-TENOFOVIR 600-200-300 MG PO TABS: ORAL_TABLET | ORAL | Status: DC

## 2014-07-10 NOTE — Telephone Encounter (Signed)
ADAP Application 

## 2014-07-11 LAB — CBC WITH DIFFERENTIAL/PLATELET
Basophils Absolute: 0.1 10*3/uL (ref 0.0–0.1)
Basophils Relative: 1 % (ref 0–1)
EOS PCT: 2 % (ref 0–5)
Eosinophils Absolute: 0.2 10*3/uL (ref 0.0–0.7)
HEMATOCRIT: 43.2 % (ref 39.0–52.0)
Hemoglobin: 14.6 g/dL (ref 13.0–17.0)
LYMPHS ABS: 2.6 10*3/uL (ref 0.7–4.0)
LYMPHS PCT: 30 % (ref 12–46)
MCH: 29.2 pg (ref 26.0–34.0)
MCHC: 33.8 g/dL (ref 30.0–36.0)
MCV: 86.4 fL (ref 78.0–100.0)
MONO ABS: 0.4 10*3/uL (ref 0.1–1.0)
Monocytes Relative: 4 % (ref 3–12)
Neutro Abs: 5.5 10*3/uL (ref 1.7–7.7)
Neutrophils Relative %: 63 % (ref 43–77)
Platelets: 274 10*3/uL (ref 150–400)
RBC: 5 MIL/uL (ref 4.22–5.81)
RDW: 14.6 % (ref 11.5–15.5)
WBC: 8.8 10*3/uL (ref 4.0–10.5)

## 2014-07-11 LAB — COMPREHENSIVE METABOLIC PANEL
ALBUMIN: 3.8 g/dL (ref 3.5–5.2)
ALT: 14 U/L (ref 0–53)
AST: 15 U/L (ref 0–37)
Alkaline Phosphatase: 63 U/L (ref 39–117)
BUN: 9 mg/dL (ref 6–23)
CALCIUM: 8.9 mg/dL (ref 8.4–10.5)
CHLORIDE: 109 meq/L (ref 96–112)
CO2: 23 meq/L (ref 19–32)
CREATININE: 1.01 mg/dL (ref 0.50–1.35)
Glucose, Bld: 158 mg/dL — ABNORMAL HIGH (ref 70–99)
Potassium: 3.6 mEq/L (ref 3.5–5.3)
Sodium: 141 mEq/L (ref 135–145)
Total Bilirubin: 0.3 mg/dL (ref 0.2–1.2)
Total Protein: 6.2 g/dL (ref 6.0–8.3)

## 2014-07-11 LAB — T-HELPER CELL (CD4) - (RCID CLINIC ONLY)
CD4 % Helper T Cell: 43 % (ref 33–55)
CD4 T CELL ABS: 1130 /uL (ref 400–2700)

## 2014-07-11 LAB — HIV-1 RNA QUANT-NO REFLEX-BLD
HIV 1 RNA Quant: <20 copies/mL (ref <20)
HIV-1 RNA Quant, Log: <1.3 {Log} (ref <1.30)

## 2014-08-06 ENCOUNTER — Encounter: Payer: Self-pay | Admitting: Internal Medicine

## 2014-08-06 ENCOUNTER — Ambulatory Visit (INDEPENDENT_AMBULATORY_CARE_PROVIDER_SITE_OTHER): Payer: Self-pay | Admitting: Internal Medicine

## 2014-08-06 VITALS — BP 144/80 | HR 89 | Temp 97.7°F | Wt 158.0 lb

## 2014-08-06 DIAGNOSIS — N63 Unspecified lump in unspecified breast: Secondary | ICD-10-CM

## 2014-08-06 DIAGNOSIS — Z23 Encounter for immunization: Secondary | ICD-10-CM

## 2014-08-06 DIAGNOSIS — B20 Human immunodeficiency virus [HIV] disease: Secondary | ICD-10-CM

## 2014-08-06 DIAGNOSIS — F3130 Bipolar disorder, current episode depressed, mild or moderate severity, unspecified: Secondary | ICD-10-CM

## 2014-08-06 NOTE — Progress Notes (Signed)
Patient ID: Edwin Martinez, male   DOB: 02/04/1986, 28 y.o.   MRN: [id number]       Patient ID: Edwin Martinez, male   DOB: 02/04/1986, 28 y.o.   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 In a new relationship. Has upcoming appt with mental health providers. No recent illnesses. In the past 6-12 months, he has noticed breast lump beneath areola on the right. occ tender, not fluctuant  Social: discordant couple. New partner in hiv negative, using condoms  Current Outpatient Prescriptions on File Prior to Visit  Medication Sig Dispense Refill  . Abacavir-Dolutegravir-Lamivud (TRIUMEQ) 600-50-300 MG TABS Take 1 tablet by mouth daily. For HIV infection 30 tablet 11  . divalproex (DEPAKOTE) 500 MG DR tablet Take 1 tablet (500 mg total) by mouth 2 (two) times daily. For mood stabilization 60 tablet 0  . traZODone (DESYREL) 50 MG tablet Take 1 tablet (50 mg total) by mouth at bedtime and may repeat dose one time if needed. For sleep 60 tablet 0  . busPIRone (BUSPAR) 10 MG tablet Take 2 tablets (20 mg total) by mouth 2 (two) times daily. For anxiety 120 tablet 0   No current facility-administered medications on file prior to visit.     Patient Active Problem List   Diagnosis Date Noted  . Bipolar I disorder, most recent episode depressed 05/24/2014  . GAD (generalized anxiety disorder) 05/24/2014  . MDD (major depressive disorder) 05/23/2014  . Suicide attempt 05/23/2014  . MDD (major depressive disorder), recurrent episode, severe 05/23/2014  . Transaminitis 03/20/2014  . ADHD (attention deficit hyperactivity disorder) 09/12/2012  . INSOMNIA, CHRONIC 05/20/2007  . CARPAL TUNNEL SYNDROME 05/20/2007  . 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 HIV DISEASE 05/05/2007  . BPLR I, MIXED, MOST RECENT EPSD, MODERATE  05/05/2007     Health Maintenance Due  Topic Date Due  . TETANUS/TDAP  08/08/2004  . INFLUENZA VACCINE  04/28/2014     Review of Systems Review of Systems  Constitutional: Negative for fever, chills, diaphoresis, activity change, appetite change, fatigue and unexpected weight change.  HENT: Negative for congestion, sore throat, rhinorrhea, sneezing, trouble swallowing and sinus pressure.  Eyes: Negative for photophobia and visual disturbance.  Respiratory: Negative for cough, chest tightness, shortness of breath, wheezing and stridor.  Cardiovascular: Negative for chest pain, palpitations and leg swelling.  Gastrointestinal: Negative for nausea, vomiting, abdominal pain, diarrhea, constipation, blood in stool, abdominal distention and anal bleeding.  Genitourinary: Negative for dysuria, hematuria, flank pain and difficulty urinating.  Musculoskeletal: Negative for myalgias, back pain, joint swelling, arthralgias and gait problem.  Skin: + breast lump  Neurological: Negative for dizziness, tremors, weakness and light-headedness.  Hematological: Negative for adenopathy. Does not bruise/bleed easily.  Psychiatric/Behavioral: Negative for behavioral problems, confusion, sleep disturbance, dysphoric mood, decreased concentration and agitation.    Physical Exam   BP 144/80 mmHg  Pulse 89  Temp(Src) 97.7 F (36.5 C) (Oral)  Wt 158 lb (71.668 kg) Physical Exam  Constitutional: He is oriented to person, place, and time. He appears well-developed and well-nourished. No distress.  HENT:  Mouth/Throat: Oropharynx is clear and moist. No oropharyngeal exudate.  Cardiovascular: Normal rate, regular rhythm and normal heart sounds. Exam reveals no gallop and no friction rub.  No murmur heard.  Pulmonary/Chest: Effort normal and breath sounds normal. No respiratory distress.  He has no wheezes.  Abdominal: Soft. Bowel sounds are normal. He exhibits no distension. There is no tenderness.  Chest  wall = 1.5 cm breast lump beneath areola, firm,no fluctuance, no erythema, no discharge from nipple.nonmobile Lymphadenopathy:  He has no cervical adenopathy.  Neurological: He is alert and oriented to person, place, and time.  Skin: Skin is warm and dry. No rash noted. No erythema.  Psychiatric: He has a normal mood and affect. His behavior is normal.     Lab Results  Component Value Date   CD4TCELL 43 07/10/2014   Lab Results  Component Value Date   CD4TABS 1130 07/10/2014   CD4TABS 1160 05/09/2014   CD4TABS 1150 02/21/2014   Lab Results  Component Value Date   HIV1RNAQUANT <20 07/10/2014   Lab Results  Component Value Date   HEPBSAB REACTIVE* 09/06/2012   No results found for: RPR  CBC Lab Results  Component Value Date   WBC 8.8 07/10/2014   RBC 5.00 07/10/2014   HGB 14.6 07/10/2014   HCT 43.2 07/10/2014   PLT 274 07/10/2014   MCV 86.4 07/10/2014   MCH 29.2 07/10/2014   MCHC 33.8 07/10/2014   RDW 14.6 07/10/2014   LYMPHSABS 2.6 07/10/2014   MONOABS 0.4 07/10/2014   EOSABS 0.2 07/10/2014   BASOSABS 0.1 07/10/2014   BMET Lab Results  Component Value Date   NA 141 07/10/2014   K 3.6 07/10/2014   CL 109 07/10/2014   CO2 23 07/10/2014   GLUCOSE 158* 07/10/2014   BUN 9 07/10/2014   CREATININE 1.01 07/10/2014   CALCIUM 8.9 07/10/2014   GFRNONAA >90 05/26/2014   GFRAA >90 05/26/2014     Assessment and Plan  hiv = continue with triomeq  Right breast lump = will see how to get ultrasound, lump occurred in the last 6-12 months  Bipolar = continue on current med management, continue on trazodone, buspirone, depakote. buspar not covered under adap, but can get through community psychiatry services like [facility]  Health maintenance = will give flu vaccine today  rtc in 3 monhts

## 2014-08-07 ENCOUNTER — Ambulatory Visit (INDEPENDENT_AMBULATORY_CARE_PROVIDER_SITE_OTHER): Payer: Self-pay | Admitting: *Deleted

## 2014-08-07 ENCOUNTER — Other Ambulatory Visit: Payer: Self-pay | Admitting: *Deleted

## 2014-08-07 ENCOUNTER — Other Ambulatory Visit: Payer: Self-pay | Admitting: Internal Medicine

## 2014-08-07 DIAGNOSIS — F3130 Bipolar disorder, current episode depressed, mild or moderate severity, unspecified: Secondary | ICD-10-CM

## 2014-08-07 DIAGNOSIS — N63 Unspecified lump in unspecified breast: Secondary | ICD-10-CM

## 2014-08-07 DIAGNOSIS — G4700 Insomnia, unspecified: Secondary | ICD-10-CM

## 2014-08-07 DIAGNOSIS — F332 Major depressive disorder, recurrent severe without psychotic features: Secondary | ICD-10-CM

## 2014-08-07 DIAGNOSIS — Z23 Encounter for immunization: Secondary | ICD-10-CM

## 2014-08-07 MED ORDER — TRAZODONE HCL 50 MG PO TABS: 50.0000 mg | ORAL_TABLET | Freq: Every evening | ORAL | Status: DC | PRN

## 2014-08-07 MED ORDER — DIVALPROEX SODIUM 500 MG PO DR TAB: 500.0000 mg | DELAYED_RELEASE_TABLET | Freq: Two times a day (BID) | ORAL | Status: DC

## 2014-08-07 MED ORDER — BUSPIRONE HCL 10 MG PO TABS: 20.0000 mg | ORAL_TABLET | Freq: Two times a day (BID) | ORAL | Status: DC

## 2014-08-22 ENCOUNTER — Other Ambulatory Visit: Payer: Self-pay | Admitting: Infectious Disease

## 2014-08-22 DIAGNOSIS — B20 Human immunodeficiency virus [HIV] disease: Secondary | ICD-10-CM

## 2014-09-12 ENCOUNTER — Emergency Department (HOSPITAL_COMMUNITY)
Admission: EM | Admit: 2014-09-12 | Discharge: 2014-09-14 | Disposition: A | Discharge status: Home / Self Care | Payer: Self-pay | Attending: Emergency Medicine | Admitting: Emergency Medicine

## 2014-09-12 ENCOUNTER — Encounter (HOSPITAL_COMMUNITY): Payer: Self-pay | Admitting: Family Medicine

## 2014-09-12 ENCOUNTER — Emergency Department (HOSPITAL_COMMUNITY)
Admission: EM | Admit: 2014-09-12 | Discharge: 2014-09-12 | Discharge status: Against Medical Advice | Payer: Self-pay | Attending: Emergency Medicine | Admitting: Emergency Medicine

## 2014-09-12 ENCOUNTER — Encounter (HOSPITAL_COMMUNITY): Payer: Self-pay | Admitting: *Deleted

## 2014-09-12 DIAGNOSIS — F32A Depression, unspecified: Secondary | ICD-10-CM

## 2014-09-12 DIAGNOSIS — J45909 Unspecified asthma, uncomplicated: Secondary | ICD-10-CM | POA: Insufficient documentation

## 2014-09-12 DIAGNOSIS — F3130 Bipolar disorder, current episode depressed, mild or moderate severity, unspecified: Secondary | ICD-10-CM

## 2014-09-12 DIAGNOSIS — Z720 Tobacco use: Secondary | ICD-10-CM | POA: Insufficient documentation

## 2014-09-12 DIAGNOSIS — Z21 Asymptomatic human immunodeficiency virus [HIV] infection status: Secondary | ICD-10-CM | POA: Insufficient documentation

## 2014-09-12 DIAGNOSIS — G4700 Insomnia, unspecified: Secondary | ICD-10-CM

## 2014-09-12 DIAGNOSIS — F1410 Cocaine abuse, uncomplicated: Secondary | ICD-10-CM | POA: Insufficient documentation

## 2014-09-12 DIAGNOSIS — B20 Human immunodeficiency virus [HIV] disease: Secondary | ICD-10-CM

## 2014-09-12 DIAGNOSIS — Z79899 Other long term (current) drug therapy: Secondary | ICD-10-CM | POA: Insufficient documentation

## 2014-09-12 DIAGNOSIS — F3176 Bipolar disorder, in full remission, most recent episode depressed: Secondary | ICD-10-CM | POA: Insufficient documentation

## 2014-09-12 DIAGNOSIS — F1210 Cannabis abuse, uncomplicated: Secondary | ICD-10-CM | POA: Insufficient documentation

## 2014-09-12 DIAGNOSIS — F329 Major depressive disorder, single episode, unspecified: Secondary | ICD-10-CM

## 2014-09-12 DIAGNOSIS — F332 Major depressive disorder, recurrent severe without psychotic features: Secondary | ICD-10-CM

## 2014-09-12 DIAGNOSIS — Z880 Allergy status to penicillin: Secondary | ICD-10-CM | POA: Insufficient documentation

## 2014-09-12 DIAGNOSIS — F10129 Alcohol abuse with intoxication, unspecified: Secondary | ICD-10-CM | POA: Insufficient documentation

## 2014-09-12 DIAGNOSIS — R45851 Suicidal ideations: Secondary | ICD-10-CM

## 2014-09-12 LAB — CBC
HCT: 42.7 % (ref 39.0–52.0)
Hemoglobin: 14.4 g/dL (ref 13.0–17.0)
MCH: 28.8 pg (ref 26.0–34.0)
MCHC: 33.7 g/dL (ref 30.0–36.0)
MCV: 85.4 fL (ref 78.0–100.0)
Platelets: 270 10*3/uL (ref 150–400)
RBC: 5 MIL/uL (ref 4.22–5.81)
RDW: 13.3 % (ref 11.5–15.5)
WBC: 13.9 10*3/uL — ABNORMAL HIGH (ref 4.0–10.5)

## 2014-09-12 LAB — COMPREHENSIVE METABOLIC PANEL
ALBUMIN: 4 g/dL (ref 3.5–5.2)
ALK PHOS: 82 U/L (ref 39–117)
ALT: 16 U/L (ref 0–53)
ANION GAP: 15 (ref 5–15)
AST: 21 U/L (ref 0–37)
BUN: 16 mg/dL (ref 6–23)
CO2: 21 mEq/L (ref 19–32)
Calcium: 9.4 mg/dL (ref 8.4–10.5)
Chloride: 100 mEq/L (ref 96–112)
Creatinine, Ser: 0.95 mg/dL (ref 0.50–1.35)
GFR calc Af Amer: >90 mL/min (ref >90)
GFR calc non Af Amer: >90 mL/min (ref >90)
Glucose, Bld: 80 mg/dL (ref 70–99)
POTASSIUM: 4 meq/L (ref 3.7–5.3)
Sodium: 136 mEq/L — ABNORMAL LOW (ref 137–147)
TOTAL PROTEIN: 7.9 g/dL (ref 6.0–8.3)
Total Bilirubin: 0.3 mg/dL (ref 0.3–1.2)

## 2014-09-12 LAB — RAPID URINE DRUG SCREEN, HOSP PERFORMED
Amphetamines: NOT DETECTED
BARBITURATES: NOT DETECTED
Benzodiazepines: NOT DETECTED
COCAINE: POSITIVE — AB
Opiates: NOT DETECTED
TETRAHYDROCANNABINOL: POSITIVE — AB

## 2014-09-12 LAB — ETHANOL

## 2014-09-12 LAB — SALICYLATE LEVEL: Salicylate Lvl: <2 mg/dL — ABNORMAL LOW (ref 2.8–20.0)

## 2014-09-12 LAB — ACETAMINOPHEN LEVEL

## 2014-09-12 MED ORDER — ZOLPIDEM TARTRATE 5 MG PO TABS: 5.0000 mg | ORAL_TABLET | Freq: Every evening | ORAL | Status: DC | PRN

## 2014-09-12 MED ORDER — IBUPROFEN 400 MG PO TABS: 600.0000 mg | ORAL_TABLET | Freq: Three times a day (TID) | ORAL | Status: DC | PRN

## 2014-09-12 MED ORDER — LORAZEPAM 1 MG PO TABS: 1.0000 mg | ORAL_TABLET | Freq: Three times a day (TID) | ORAL | Status: DC | PRN

## 2014-09-12 MED ORDER — ABACAVIR SULFATE 300 MG PO TABS
600.0000 mg | ORAL_TABLET | Freq: Every day | ORAL | Status: DC
  Administered 2014-09-12 – 2014-09-14 (×3): 600 mg via ORAL
  Filled 2014-09-12 (×3): qty 2

## 2014-09-12 MED ORDER — TRAZODONE HCL 50 MG PO TABS
50.0000 mg | ORAL_TABLET | Freq: Every evening | ORAL | Status: DC | PRN
  Administered 2014-09-12 – 2014-09-13 (×2): 50 mg via ORAL
  Filled 2014-09-12 (×2): qty 1

## 2014-09-12 MED ORDER — DIVALPROEX SODIUM 250 MG PO DR TAB
500.0000 mg | DELAYED_RELEASE_TABLET | Freq: Two times a day (BID) | ORAL | Status: DC
  Administered 2014-09-12 – 2014-09-14 (×5): 500 mg via ORAL
  Filled 2014-09-12 (×5): qty 2

## 2014-09-12 MED ORDER — ABACAVIR-DOLUTEGRAVIR-LAMIVUD 600-50-300 MG PO TABS
1.0000 | ORAL_TABLET | Freq: Every day | ORAL | Status: DC
  Filled 2014-09-12: qty 1

## 2014-09-12 MED ORDER — ONDANSETRON HCL 4 MG PO TABS: 4.0000 mg | ORAL_TABLET | Freq: Three times a day (TID) | ORAL | Status: DC | PRN

## 2014-09-12 MED ORDER — DOLUTEGRAVIR SODIUM 50 MG PO TABS
50.0000 mg | ORAL_TABLET | Freq: Every day | ORAL | Status: DC
  Administered 2014-09-12 – 2014-09-14 (×3): 50 mg via ORAL
  Filled 2014-09-12 (×3): qty 1

## 2014-09-12 MED ORDER — NICOTINE 21 MG/24HR TD PT24: 21.0000 mg | MEDICATED_PATCH | Freq: Every day | TRANSDERMAL | Status: DC

## 2014-09-12 MED ORDER — BUSPIRONE HCL 10 MG PO TABS
20.0000 mg | ORAL_TABLET | Freq: Two times a day (BID) | ORAL | Status: DC
  Administered 2014-09-12 – 2014-09-14 (×5): 20 mg via ORAL
  Filled 2014-09-12 (×5): qty 2

## 2014-09-12 MED ORDER — ACETAMINOPHEN 325 MG PO TABS: 650.0000 mg | ORAL_TABLET | ORAL | Status: DC | PRN

## 2014-09-12 MED ORDER — LAMIVUDINE 150 MG PO TABS
300.0000 mg | ORAL_TABLET | Freq: Every day | ORAL | Status: DC
  Administered 2014-09-12 – 2014-09-14 (×3): 300 mg via ORAL
  Filled 2014-09-12 (×3): qty 2

## 2014-09-12 NOTE — ED Notes (Signed)
Patient endorses SI without plan. Pt has been off trazodone and Depakote for approximately 1 month. Pt std in august had a 1 month Rx of Depakote and he spaced out medications "to make it last" instead of taking it a prescribed. Pt sts he lost his trazodone rx and did not see provider to get it filled. Pt. sts he got into an argument with his boyfriend last night and [place]. Pt sts walked through the night to get here and started contemplating SI and was feeling depressed because he doesn't have family for the holidays. Pt spoke to bf this morning who stated it patient got help he would be supportive and take him back after discharge. Pt cooperative and willing to get help and take medications as prescribed.   Pt wanded by security and belongings taken to Pod C

## 2014-09-12 NOTE — BH Assessment (Addendum)
Tele Assessment Note   Edwin Martinez is an 28 y.o. male. The [facility] reporting SI. Pt denies HI, delusions, and hallucinations. Pt states that he is suicidal because of the following: he becomes severely depressed around the holidays, family conflict, running out of medication, and he is in conflict with his partner. The Pt reports that he has a SI plan. According to the Pt, if he returns home he will "jump out in front of a truck." Pt reports that he attempted to harm himself in August 2015 and [facility]. The Pt reports that he has [facility], [facility], and [facility]. Pt states he has never received outpatient treatment. Pt reports he takes the following medication: Depakote, Trazodone, and Buspar. It was reported by the Pt that he has not taken his medication in 2 to 3 months because he "ran out."  Pt appeared depressed throughout the assessment.   The Clinical research associatewriter consulted with Shelly-NP. Per Shelly the Pt meets inpatient criteria. There are no [facility] beds. TTS will seek placement.  Axis I: Bipolar, Depressed Axis II: Deferred Axis III:  Past Medical History  Diagnosis Date  . HIV (human immunodeficiency virus infection)   . Bipolar 1 disorder   . Schizophrenia   . Asthma    Axis IV: housing problems, problems with access to health care services and problems with primary support group Axis V: 35  Past Medical History:  Past Medical History  Diagnosis Date  . HIV (human immunodeficiency virus infection)   . Bipolar 1 disorder   . Schizophrenia   . Asthma     History reviewed. No pertinent past surgical history.  Family History:  Family History  Problem Relation Age of Onset  . Huntington's disease Father   . Heart disease Mother     Social History:  reports that he has been smoking Cigarettes.  He started smoking about 22 years ago. He has been smoking about 0.50 packs per day. He has never used smokeless tobacco. He  reports that he drinks about 40.0 oz of alcohol per week. He reports that he uses illicit drugs (Marijuana and Cocaine) about twice per week.  Additional Social History:  Alcohol / Drug Use Pain Medications: Pt denies Prescriptions: Pt denies Over the Counter: Pt denies History of alcohol / drug use?: No history of alcohol / drug abuse Longest period of sobriety (when/how long): NA  CIWA: CIWA-Ar BP: 113/67 mmHg Pulse Rate: 84 COWS:    PATIENT STRENGTHS: (choose at least two) Communication skills Motivation for treatment/growth  Allergies:  Allergies  Allergen Reactions  . Atripla [Efavirenz-Emtricitab-Tenofovir] Other (See Comments)    DEPRESSION AND SUICIDE ATTEMPT  . Magnesium-Containing Compounds     MAGNESIUM ,COMPOUNDS CAN REDUCE LEVELS OF DOLUTEGRAVIR SO SHOULD NOT BE COADMIN WITH TIVICAY OR TRIUMEQ  . Esomeprazole Magnesium     REACTION: cough  . Peanuts [Peanut Oil]     Rash   . Penicillins     REACTION: rash  . Bactrim [Sulfamethoxazole-Trimethoprim] Rash    Patient reported    Home Medications:  (Not in a hospital admission)  OB/GYN Status:  No LMP for male patient.  General Assessment Data [facility] ACT Assessment: Yes Is this a Tele or Face-to-Face Assessment?: Tele Assessment Is this an Initial Assessment or a Re-assessment for this encounter?: Initial Assessment Living Arrangements: Spouse/significant other Can pt return to current living arrangement?: Yes Admission Status: Voluntary Is patient capable  of signing voluntary admission?: Yes Transfer from: Home Referral Source: Self/Family/Friend     [facility] Living Arrangements: Spouse/significant other Name of Psychiatrist: None Name of Therapist: None  Education Status Is patient currently in school?: No Current Grade: NA Highest grade of school patient has completed: 8 Name of school: NA Contact person: None  Risk to self with the past 6 months Suicidal  Ideation: Yes-Currently Present Suicidal Intent: Yes-Currently Present Is patient at risk for suicide?: Yes Suicidal Plan?: Yes-Currently Present Specify Current Suicidal Plan: To jump in front of a truck Access to Means: Yes Specify Access to Suicidal Means: Access to plan What has been your use of drugs/alcohol within the last 12 months?: NA Previous Attempts/Gestures: Yes How many times?: 1 Other Self Harm Risks: None Triggers for Past Attempts: None known Intentional Self Injurious Behavior: None Family Suicide History: Yes Recent stressful life event(s): Conflict (Comment) (conflict with partner) Persecutory voices/beliefs?: No Depression: Yes Depression Symptoms: Tearfulness, Isolating, Loss of interest in usual pleasures, Feeling worthless/self pity, Feeling angry/irritable Substance abuse history and/or treatment for substance abuse?: No Suicide prevention information given to non-admitted patients: Not applicable  Risk to Others within the past 6 months Homicidal Ideation: No Thoughts of Harm to Others: No Current Homicidal Intent: No Current Homicidal Plan: No Access to Homicidal Means: No Identified Victim: None History of harm to others?: No Assessment of Violence: None Noted Violent Behavior Description: None Does patient have access to weapons?: No Criminal Charges Pending?: No Does patient have a court date: No  Psychosis Hallucinations: None noted Delusions: None noted  Mental Status Report Appear/Hygiene: In scrubs, Unremarkable Eye Contact: Good Motor Activity: Freedom of movement Speech: Logical/coherent Level of Consciousness: Alert Mood: Depressed Affect: Appropriate to circumstance Anxiety Level: Moderate Thought Processes: Coherent, Relevant Judgement: Unimpaired Orientation: Person, Place, Situation, Time Obsessive Compulsive Thoughts/Behaviors: None  Cognitive Functioning Concentration: Normal Memory: Recent Intact, Remote Intact IQ:  Average Insight: Fair Impulse Control: Fair Appetite: Fair Weight Loss: 0 Weight Gain: 0 Sleep: No Change Total Hours of Sleep: 5 Vegetative Symptoms: None  ADLScreening [facility] Assessment Services) Patient's cognitive ability adequate to safely complete daily activities?: Yes Patient able to express need for assistance with ADLs?: Yes Independently performs ADLs?: Yes (appropriate for developmental age)  Prior Inpatient Therapy Prior Inpatient Therapy: Yes Prior Therapy Dates: 2015 [facility] Reason for Treatment: BiPolar  Prior Outpatient Therapy Prior Outpatient Therapy: No Prior Therapy Dates: NA Prior Therapy Facilty/Provider(s): NA Reason for Treatment: NA  ADL Screening (condition at time of admission) Patient's cognitive ability adequate to safely complete daily activities?: Yes Is the patient deaf or have difficulty hearing?: No Does the patient have difficulty seeing, even when wearing glasses/contacts?: No Does the patient have difficulty concentrating, remembering, or making decisions?: No Patient able to express need for assistance with ADLs?: Yes Does the patient have difficulty dressing or bathing?: No Independently performs ADLs?: Yes (appropriate for developmental age) Does the patient have difficulty walking or climbing stairs?: No Weakness of Legs: None Weakness of Arms/Hands: None       Abuse/Neglect Assessment (Assessment to be complete while patient is alone) Physical Abuse: Denies Verbal Abuse: Denies Sexual Abuse: Denies Exploitation of patient/patient's resources: Denies Self-Neglect: Denies Values / Beliefs Cultural Requests During Hospitalization: None Spiritual Requests During Hospitalization: None   Advance Directives (For Healthcare) Does patient have an advance directive?: No Would patient like information on creating an advanced directive?: No - patient declined information    Additional Information 1:1  In Past 12 Months?: No CIRT Risk: No Elopement Risk: No Does patient have medical clearance?: No     Disposition:  Disposition Initial Assessment Completed for this Encounter: Yes Disposition of Patient: Inpatient treatment program (Per Shelly-NP) Type of inpatient treatment program: Adult  Ammanda Dobbins D 09/12/2014 12:50 PM

## 2014-09-12 NOTE — ED Notes (Signed)
Pt wanded, placed in scrubs and staffing called.

## 2014-09-12 NOTE — ED Notes (Addendum)
Per pt "I don't have any Trazadone to take"; pt ambulating in triage; pt walking out of ER door; pt states "I am leaving"; Charge RN notified; pt walked out of ER doors

## 2014-09-12 NOTE — ED Notes (Signed)
Dr zavitz at bedside 

## 2014-09-12 NOTE — ED Notes (Signed)
TTS IN PROGRESS 

## 2014-09-12 NOTE — ED Notes (Signed)
MEDICATIONS REQUEST SENT TO PHARMACY.

## 2014-09-12 NOTE — BH Assessment (Signed)
Writer informed TTS Brandi of the consult.  

## 2014-09-12 NOTE — ED Notes (Signed)
Pt arrives to the ER via EMS; pt was intoxicated walking down [address]; pt states that he has had 80oz of ETOH and wants to overdose on his Trazadone to kill himself

## 2014-09-12 NOTE — B[facility] Counselor (Signed)
Assessment complete. Counselor consulted with Shelly-NP. Per Shelly Pt meets inpatient criteria. No [facility] beds. TTS will seek placement.  Counselor informed Annette-RN and Dr. Hoyt KochZavitz-EDP disposition.   Wolfgang PhoenixBrandi Tenea Sens, [facility] Triage Specialist

## 2014-09-12 NOTE — ED Notes (Signed)
Per pt having SI thoughts since yesterday. Denies plan.

## 2014-09-12 NOTE — ED Provider Notes (Signed)
CSN: [id number]     Arrival date & time 09/12/14  1025 History   First MD Initiated Contact with Patient 09/12/14 1111     Chief Complaint  Patient presents with  . Suicidal     (Consider location/radiation/quality/duration/timing/severity/associated sxs/prior Treatment) HPI Comments: 28 year old male with HIV, bipolar, insomnia, ADHD, asthma, major depression presents with worsening depressive symptoms and suicidal ideation with plan. Patient is had similar in the past however this time the symptoms are persistent. Patient has been taking his HIV medicines regularly however has not been taking his bipolar and sleeping medicines regularly. Patient has had thoughts of self-harm with drug ingestion and these persist. Patient has had suicide attempt in the past of drug ingestion. Patient is voluntary and wants help, he wants to feel better, his boyfriend also recommended he come and seek help.  The history is provided by the patient.    Past Medical History  Diagnosis Date  . HIV (human immunodeficiency virus infection)   . Bipolar 1 disorder   . Schizophrenia   . Asthma    History reviewed. No pertinent past surgical history. Family History  Problem Relation Age of Onset  . Huntington's disease Father   . Heart disease Mother    History  Substance Use Topics  . Smoking status: Current Every Day Smoker -- 0.50 packs/day    Types: Cigarettes    Start date: 09/29/1991  . Smokeless tobacco: Never Used  . Alcohol Use: 40.0 oz/week    80 drink(s) per week     Comment: once week     Review of Systems  Constitutional: Negative for fever and chills.  HENT: Negative for congestion.   Eyes: Negative for visual disturbance.  Respiratory: Negative for shortness of breath.   Cardiovascular: Negative for chest pain.  Gastrointestinal: Negative for vomiting and abdominal pain.  Genitourinary: Negative for dysuria and flank pain.  Musculoskeletal: Negative for back pain, neck pain and  neck stiffness.  Skin: Negative for rash.  Neurological: Negative for light-headedness and headaches.  Psychiatric/Behavioral: Positive for suicidal ideas, sleep disturbance and dysphoric mood.      Allergies  Atripla; Magnesium-containing compounds; Esomeprazole magnesium; Peanuts; Penicillins; and Bactrim  Home Medications   Prior to Admission medications   Medication Sig Start Date End Date Taking? Authorizing Provider  TRIUMEQ 600-50-300 MG TABS TAKE 1 TABLET BY MOUTH EVERY DAY 08/22/14  Yes Randall Hissornelius N Van Dam, MD  busPIRone (BUSPAR) 10 MG tablet Take 2 tablets (20 mg total) by mouth 2 (two) times daily. For anxiety 08/07/14   Judyann Munsonynthia Snider, MD  divalproex (DEPAKOTE) 500 MG DR tablet Take 1 tablet (500 mg total) by mouth 2 (two) times daily. For mood stabilization 08/07/14   Judyann Munsonynthia Snider, MD  traZODone (DESYREL) 50 MG tablet Take 1 tablet (50 mg total) by mouth at bedtime and may repeat dose one time if needed. For sleep 08/07/14   Judyann Munsonynthia Snider, MD   BP 113/67 mmHg  Pulse 84  Temp(Src) 98.2 F (36.8 C) (Oral)  Resp 24  SpO2 99% Physical Exam  Constitutional: He is oriented to person, place, and time. He appears well-developed and well-nourished.  HENT:  Head: Normocephalic and atraumatic.  Eyes: Conjunctivae are normal. Right eye exhibits no discharge. Left eye exhibits no discharge.  Neck: Normal range of motion. Neck supple. No tracheal deviation present.  Cardiovascular: Normal rate and regular rhythm.   Pulmonary/Chest: Effort normal and breath sounds normal.  Abdominal: Soft. He exhibits no distension. There is no tenderness. There is  no guarding.  Musculoskeletal: He exhibits no edema.  Neurological: He is alert and oriented to person, place, and time.  Skin: Skin is warm. No rash noted.  Psychiatric: His speech is not rapid and/or pressured and not delayed. He expresses no homicidal plans.  Patient cooperative, mild flat affect  Nursing note and vitals  reviewed.   ED Course  Procedures (including critical care time) Labs Review Labs Reviewed  CBC - Abnormal; Notable for the following:    WBC 13.9 (*)    All other components within normal limits  COMPREHENSIVE METABOLIC PANEL - Abnormal; Notable for the following:    Sodium 136 (*)    All other components within normal limits  SALICYLATE LEVEL - Abnormal; Notable for the following:    Salicylate Lvl <2.0 (*)    All other components within normal limits  URINE RAPID DRUG SCREEN (HOSP PERFORMED) - Abnormal; Notable for the following:    Cocaine POSITIVE (*)    Tetrahydrocannabinol POSITIVE (*)    All other components within normal limits  ETHANOL  ACETAMINOPHEN LEVEL    Imaging Review No results found.   EKG Interpretation None      MDM   Final diagnoses:  Suicidal ideations  Depression  Bipolar I disorder, most recent episode depressed   Patient with history of bipolar and depression presents with worsening depression and suicidal ideation with persistent plan. Patient cooperative and voluntary. Behavior health assessment agrees with inpatient, bed pending. Meal ordered.  No acute issues during my shift. Bed placement pending.       Enid SkeensJoshua M Santrice Muzio, MD 09/12/14 [phone]

## 2014-09-12 NOTE — ED Notes (Signed)
PATIENT STATES THAT HE AND HIS CURRENT BOYFRIEND WERE ARGUING LAST NIGHT AND HE TOLD HIM TO JUST TAKE HIM TO HIS FOSTER MOMS HOME AND THE BOYFRIEND DID. WHEN ASKED PT ABOUT HIS MED COMPLIANCE PT STATES THAT HE DIDN'T HAVE ANY TRANSPORTATION TO GET TO [name] TO GET HIS MEDICATIONS. STATES HE WAS LIVING IN [place]. PATIENT STATES HE CANT REMEMBER WHO(STAFF) THAT HE HAS TOLD WHAT EVENTS TO.

## 2014-09-13 DIAGNOSIS — R45851 Suicidal ideations: Secondary | ICD-10-CM

## 2014-09-13 NOTE — [facility] Counselor (Signed)
TTS Counselor faxed pt referral to the following facilities for inpt treatment consideration:  [facility]       Cyndie MullAnna Krissy Orebaugh, [facility] Triage Specialist [facility]

## 2014-09-13 NOTE — Progress Notes (Signed)
[facility] and [facility].  Derrell Lollingoris Weslie Pretlow, MSW Social Worker [phone]

## 2014-09-13 NOTE — ED Notes (Signed)
Pt is requesting outpatient treatment vs inpatient treatment at this time. Pt is denying suicidal ideations and is willing to sign a no harm contract if he can get his prescriptions and outpatient help. Also [facility] to relay the message to Psych provider in the AM.

## 2014-09-13 NOTE — Consult Note (Signed)
[facility]  Subjective: Pt seen and chart reviewed. Pt is very tearful during assessment and reports that he "can't go back out there, I know I'll kill myself, I feel exactly the same as I did yesterday". Pt states that he ran out of medication and this may be part of the reason behind his depression, reportedly. Pt affirms SI with plan to run into traffic, denies HI and AVH.   HPI:  Edwin Martinez is an 28 y.o. male. The Pt [facility] reporting SI. Pt denies HI, delusions, and hallucinations. Pt states that he is suicidal because of the following: he becomes severely depressed around the holidays, family conflict, running out of medication, and he is in conflict with his partner. The Pt reports that he has a SI plan. According to the Pt, if he returns home he will "jump out in front of a truck." Pt reports that he attempted to harm himself in August 2015 and [facility]. The Pt reports that he has [facility], [facility], and [facility]. Pt states he has never received outpatient treatment. Pt reports he takes the following medication: Depakote, Trazodone, and Buspar. It was reported by the Pt that he has not taken his medication in 2 to 3 months because he "ran out."  Pt appeared depressed throughout the assessment.    Axis I: Bipolar, Depressed Axis II: Deferred Axis III:  Past Medical History  Diagnosis Date  . HIV (human immunodeficiency virus infection)   . Bipolar 1 disorder   . Schizophrenia   . Asthma    Axis IV: housing problems, problems with access to health care services and problems with primary support group Axis V: Serious Symptoms  Psychiatric Specialty Exam: Physical Exam  ROS  Blood pressure 129/60, pulse 64, temperature 97.9 F (36.6 C), temperature source Oral, resp. rate 18, SpO2 100 %.There is no weight on file to calculate BMI.  General Appearance: Casual and Fairly Groomed  Patent attorneyye Contact::  Good   Speech:  Clear and Coherent and Normal Rate  Volume:  Normal  Mood:  Anxious and Depressed  Affect:  Congruent and Depressed  Thought Process:  Coherent and Goal Directed  Orientation:  Full (Time, Place, and Person)  Thought Content:  WDL  Suicidal Thoughts:  Yes.  with intent/plan  Homicidal Thoughts:  No  Memory:  Immediate;   Fair Recent;   Fair Remote;   Fair  Judgement:  Fair  Insight:  Lacking  Psychomotor Activity:  Normal  Concentration:  Good  Recall:  Good  Akathisia:  No  Handed:    AIMS (if indicated):     Assets:  Communication Skills Desire for Improvement Resilience  Sleep:         Past Medical History:  Past Medical History  Diagnosis Date  . HIV (human immunodeficiency virus infection)   . Bipolar 1 disorder   . Schizophrenia   . Asthma     History reviewed. No pertinent past surgical history.  Family History:  Family History  Problem Relation Age of Onset  . Huntington's disease Father   . Heart disease Mother     Social History:  reports that he has been smoking Cigarettes.  He started smoking about 22 years ago. He has been smoking about 0.50 packs per day. He has never used smokeless tobacco. He reports that he drinks about 40.0 oz of alcohol per week. He reports that he uses illicit drugs (Marijuana and  Cocaine) about twice per week.  Additional Social History:  Alcohol / Drug Use Pain Medications: Pt denies Prescriptions: Pt denies Over the Counter: Pt denies History of alcohol / drug use?: No history of alcohol / drug abuse Longest period of sobriety (when/how long): NA  CIWA: CIWA-Ar BP: 129/60 mmHg Pulse Rate: 64 COWS:    PATIENT STRENGTHS: (choose at least two) Communication skills Motivation for treatment/growth  Allergies:  Allergies  Allergen Reactions  . Atripla [Efavirenz-Emtricitab-Tenofovir] Other (See Comments)    DEPRESSION AND SUICIDE ATTEMPT  . Magnesium-Containing Compounds     MAGNESIUM ,COMPOUNDS CAN  REDUCE LEVELS OF DOLUTEGRAVIR SO SHOULD NOT BE COADMIN WITH TIVICAY OR TRIUMEQ  . Esomeprazole Magnesium     REACTION: cough  . Peanuts [Peanut Oil]     Rash   . Penicillins     REACTION: rash  . Bactrim [Sulfamethoxazole-Trimethoprim] Rash    Patient reported    Home Medications:  (Not in a hospital admission)  OB/GYN Status:  No LMP for male patient.  Disposition:  -Admit to inpatient when bed available for safety and stabilization  Beau FannyWithrow, John C 09/13/2014 12:05 PM  Case discussed with me as above

## 2014-09-13 NOTE — ED Notes (Signed)
Patient has completed his telepsych with conrand np at [facility]. Per conrad the plan continues to be to find placement for him

## 2014-09-14 DIAGNOSIS — F3130 Bipolar disorder, current episode depressed, mild or moderate severity, unspecified: Secondary | ICD-10-CM

## 2014-09-14 MED ORDER — BUSPIRONE HCL 10 MG PO TABS: 20.0000 mg | ORAL_TABLET | Freq: Two times a day (BID) | ORAL | Status: DC

## 2014-09-14 MED ORDER — TRAZODONE HCL 50 MG PO TABS: 50.0000 mg | ORAL_TABLET | Freq: Every evening | ORAL | Status: DC | PRN

## 2014-09-14 MED ORDER — ABACAVIR-DOLUTEGRAVIR-LAMIVUD 600-50-300 MG PO TABS: 1.0000 | ORAL_TABLET | Freq: Every day | ORAL | Status: DC

## 2014-09-14 MED ORDER — DIVALPROEX SODIUM 500 MG PO DR TAB: 500.0000 mg | DELAYED_RELEASE_TABLET | Freq: Two times a day (BID) | ORAL | Status: DC

## 2014-09-14 NOTE — BH Assessment (Signed)
[facility] Assessment Progress Note   Faxed requested OP resources to Rn.

## 2014-09-14 NOTE — Consult Note (Signed)
[name]  Subjective: Pt seen and chart reviewed. Pt is euthymic states that he feels better after sleeping well last night.  Reports that feels he can cope with stress of the holidays now that his medications have been restarted HPI:  Edwin Martinez is an 28 y.o. male. [facility]  reporting SI.  With plan. Patient reports that he had become increasingly irritable, and depressed as it was the holidays and states that he has had some relationship difficulties with his partner.  Patient endorsed sadness, decreased ability to concentrate, irritability and poor sleep as precursors to depressed feelings yesterday.  Patient denies suicidal ideation and reports that he is feeling better able to cope with holiday stress today since his medications have been restarted and he was able to sleep well last evening.  Patient denies suicidal ideation.  Denies homicidal ideation and auditory and visual hallucinations.  Patient has a history of bipolar disorder and states he ran out of his Depakote, Trazodone and Buspar.  Patient reports that these medications are effective in helping him to achieve mood stability when he has access to them.  Discussed options for outpatient management of depression and states that he is [facility] and would like assistance in finding a therapist with [facility] as well.  Patient appears euthymic, reports stable mood and affect with safety plan established to discuss feelings with partner, to return to emergency department should thoughts to self harm resurface. Patient endorses substance use including THC and some cocaine but denies that he desires assistance with substance recovery at this time.   Axis I: Bipolar, Depressed Axis II: Deferred Axis III:  Past Medical History  Diagnosis Date  . HIV (human immunodeficiency virus infection)   . Bipolar 1 disorder   . Schizophrenia   . Asthma    Axis IV: housing  problems, problems with access to health care services and problems with primary support group Axis V: Serious Symptoms  Psychiatric Specialty Exam: Physical Exam  ROS  Blood pressure 122/65, pulse 49, temperature 98.3 F (36.8 C), temperature source Oral, resp. rate 16, SpO2 99 %.There is no weight on file to calculate BMI.  General Appearance: Casual and Fairly Groomed  Patent attorneyye Contact::  Good  Speech:  Clear and Coherent and Normal Rate  Volume:  Normal  Mood:  Euthymic  Affect:  Congruent  Thought Process:  Coherent and Goal Directed  Orientation:  Full (Time, Place, and Person)  Thought Content:  WDL  Suicidal Thoughts:  No  Homicidal Thoughts:  No  Memory:  Immediate;   Fair Recent;   Fair Remote;   Fair  Judgement:  Fair  Insight:  Lacking  Psychomotor Activity:  Normal  Concentration:  Good  Recall:  Good  Akathisia:  No  Handed:    AIMS (if indicated):     Assets:  Communication Skills Desire for Improvement Resilience  Sleep:         Past Medical History:  Past Medical History  Diagnosis Date  . HIV (human immunodeficiency virus infection)   . Bipolar 1 disorder   . Schizophrenia   . Asthma     History reviewed. No pertinent past surgical history.  Family History:  Family History  Problem Relation Age of Onset  . Huntington's disease Father   . Heart disease Mother     Social History:  reports that he has been smoking Cigarettes.  He started smoking  about 22 years ago. He has been smoking about 0.50 packs per day. He has never used smokeless tobacco. He reports that he drinks about 40.0 oz of alcohol per week. He reports that he uses illicit drugs (Marijuana and Cocaine) about twice per week.  Additional Social History:  Alcohol / Drug Use Pain Medications: Pt denies Prescriptions: Pt denies Over the Counter: Pt denies History of alcohol / drug use?: No history of alcohol / drug abuse Longest period of sobriety (when/how long): NA  CIWA:  CIWA-Ar BP: 129/60 mmHg Pulse Rate: 64 COWS:    PATIENT STRENGTHS: (choose at least two) Communication skills Motivation for treatment/growth  Allergies:  Allergies  Allergen Reactions  . Atripla [Efavirenz-Emtricitab-Tenofovir] Other (See Comments)    DEPRESSION AND SUICIDE ATTEMPT  . Magnesium-Containing Compounds     MAGNESIUM ,COMPOUNDS CAN REDUCE LEVELS OF DOLUTEGRAVIR SO SHOULD NOT BE COADMIN WITH TIVICAY OR TRIUMEQ  . Esomeprazole Magnesium     REACTION: cough  . Peanuts [Peanut Oil]     Rash   . Penicillins     REACTION: rash  . Bactrim [Sulfamethoxazole-Trimethoprim] Rash    Patient reported    Home Medications:  (Not in a hospital admission)  OB/GYN Status:  No LMP for male patient.  Disposition:  Will discharge to home with ref[facility] and [facility].  Recommend providing patient with prescription for psychotropic medications at time of discharge to facilitate medication compliance.  Discussed substance abuse et patient denies wanting substance abuse treatment or referral at this time.  Patient to follow up with his primary care provider for outpatient medical needs. 1.  Take all your medications as prescribed.              2.  Report any adverse side effects to outpatient provider.                       3.  Patient instructed to not use alcohol or illegal drugs while on prescription medicines.            4.  In the event of worsening symptoms, instructed patient to call 911, the crisis hotline or go to nearest emergency room for evaluation of symptoms.  Bonnetta BarryShelly Eisbach PMH-NP   Case discussed with me as above

## 2014-09-14 NOTE — ED Provider Notes (Signed)
Discharged per behavioral health team. Please see consultation note  Lyanne CoKevin M Shabana Armentrout, MD 09/14/14 1018

## 2014-10-25 ENCOUNTER — Encounter (HOSPITAL_COMMUNITY): Payer: Self-pay

## 2014-10-25 DIAGNOSIS — Z008 Encounter for other general examination: Secondary | ICD-10-CM | POA: Insufficient documentation

## 2014-10-25 DIAGNOSIS — Z720 Tobacco use: Secondary | ICD-10-CM | POA: Insufficient documentation

## 2014-10-25 DIAGNOSIS — Z21 Asymptomatic human immunodeficiency virus [HIV] infection status: Secondary | ICD-10-CM | POA: Insufficient documentation

## 2014-10-25 DIAGNOSIS — J45909 Unspecified asthma, uncomplicated: Secondary | ICD-10-CM | POA: Insufficient documentation

## 2014-10-25 NOTE — ED Notes (Signed)
Patient states he is having suicidal thoughts tonight because he found out his older brother has recently been diagnosed with huntingtons disease. Patient denies having a plan and denies self harm tonight. Patient states he just wanted to end his life so he didn't have to deal with his brothers health problem

## 2014-10-26 ENCOUNTER — Emergency Department (HOSPITAL_COMMUNITY)
Admission: EM | Admit: 2014-10-26 | Discharge: 2014-10-26 | Disposition: A | Discharge status: Other Acute Inpatient Hospital | Payer: Self-pay | Attending: Emergency Medicine | Admitting: Emergency Medicine

## 2014-10-26 ENCOUNTER — Encounter (HOSPITAL_COMMUNITY): Payer: Self-pay

## 2014-10-26 ENCOUNTER — Encounter (HOSPITAL_COMMUNITY): Payer: Self-pay | Admitting: *Deleted

## 2014-10-26 ENCOUNTER — Emergency Department (HOSPITAL_COMMUNITY)
Admission: EM | Admit: 2014-10-26 | Discharge: 2014-10-26 | Discharge status: Against Medical Advice | Payer: Self-pay | Attending: Emergency Medicine | Admitting: Emergency Medicine

## 2014-10-26 ENCOUNTER — Inpatient Hospital Stay (HOSPITAL_COMMUNITY)
Admission: EM | Admit: 2014-10-26 | Discharge: 2014-10-29 | DRG: 885 | Disposition: A | Discharge status: Home / Self Care | Payer: Federal, State, Local not specified - Other | Source: Intra-hospital | Attending: Psychiatry | Admitting: Psychiatry

## 2014-10-26 DIAGNOSIS — J449 Chronic obstructive pulmonary disease, unspecified: Secondary | ICD-10-CM | POA: Diagnosis present

## 2014-10-26 DIAGNOSIS — Z21 Asymptomatic human immunodeficiency virus [HIV] infection status: Secondary | ICD-10-CM | POA: Insufficient documentation

## 2014-10-26 DIAGNOSIS — F419 Anxiety disorder, unspecified: Secondary | ICD-10-CM | POA: Diagnosis present

## 2014-10-26 DIAGNOSIS — F3130 Bipolar disorder, current episode depressed, mild or moderate severity, unspecified: Secondary | ICD-10-CM

## 2014-10-26 DIAGNOSIS — J45909 Unspecified asthma, uncomplicated: Secondary | ICD-10-CM | POA: Insufficient documentation

## 2014-10-26 DIAGNOSIS — T50901A Poisoning by unspecified drugs, medicaments and biological substances, accidental (unintentional), initial encounter: Secondary | ICD-10-CM | POA: Insufficient documentation

## 2014-10-26 DIAGNOSIS — Z79899 Other long term (current) drug therapy: Secondary | ICD-10-CM | POA: Insufficient documentation

## 2014-10-26 DIAGNOSIS — F17210 Nicotine dependence, cigarettes, uncomplicated: Secondary | ICD-10-CM | POA: Diagnosis present

## 2014-10-26 DIAGNOSIS — R45851 Suicidal ideations: Secondary | ICD-10-CM | POA: Diagnosis present

## 2014-10-26 DIAGNOSIS — Z23 Encounter for immunization: Secondary | ICD-10-CM | POA: Diagnosis not present

## 2014-10-26 DIAGNOSIS — F1210 Cannabis abuse, uncomplicated: Secondary | ICD-10-CM | POA: Insufficient documentation

## 2014-10-26 DIAGNOSIS — Z720 Tobacco use: Secondary | ICD-10-CM | POA: Insufficient documentation

## 2014-10-26 DIAGNOSIS — T50902A Poisoning by unspecified drugs, medicaments and biological substances, intentional self-harm, initial encounter: Secondary | ICD-10-CM

## 2014-10-26 DIAGNOSIS — F411 Generalized anxiety disorder: Secondary | ICD-10-CM | POA: Diagnosis present

## 2014-10-26 DIAGNOSIS — F319 Bipolar disorder, unspecified: Secondary | ICD-10-CM | POA: Diagnosis present

## 2014-10-26 DIAGNOSIS — B20 Human immunodeficiency virus [HIV] disease: Secondary | ICD-10-CM | POA: Diagnosis present

## 2014-10-26 DIAGNOSIS — F32A Depression, unspecified: Secondary | ICD-10-CM

## 2014-10-26 DIAGNOSIS — F329 Major depressive disorder, single episode, unspecified: Secondary | ICD-10-CM | POA: Insufficient documentation

## 2014-10-26 DIAGNOSIS — F209 Schizophrenia, unspecified: Secondary | ICD-10-CM | POA: Diagnosis present

## 2014-10-26 DIAGNOSIS — Z8249 Family history of ischemic heart disease and other diseases of the circulatory system: Secondary | ICD-10-CM | POA: Diagnosis not present

## 2014-10-26 DIAGNOSIS — G4700 Insomnia, unspecified: Secondary | ICD-10-CM | POA: Diagnosis present

## 2014-10-26 LAB — COMPREHENSIVE METABOLIC PANEL
ALT: 20 U/L (ref 0–53)
AST: 21 U/L (ref 0–37)
Albumin: 3.7 g/dL (ref 3.5–5.2)
Alkaline Phosphatase: 76 U/L (ref 39–117)
Anion gap: 6 (ref 5–15)
BILIRUBIN TOTAL: 0.3 mg/dL (ref 0.3–1.2)
BUN: 9 mg/dL (ref 6–23)
CO2: 23 mmol/L (ref 19–32)
CREATININE: 0.78 mg/dL (ref 0.50–1.35)
Calcium: 8.5 mg/dL (ref 8.4–10.5)
Chloride: 111 mmol/L (ref 96–112)
GFR calc non Af Amer: >90 mL/min (ref >90)
Glucose, Bld: 87 mg/dL (ref 70–99)
POTASSIUM: 3.4 mmol/L — AB (ref 3.5–5.1)
Sodium: 140 mmol/L (ref 135–145)
Total Protein: 7.2 g/dL (ref 6.0–8.3)

## 2014-10-26 LAB — CBC WITH DIFFERENTIAL/PLATELET
Basophils Absolute: 0 10*3/uL (ref 0.0–0.1)
Basophils Relative: 1 % (ref 0–1)
EOS ABS: 0.2 10*3/uL (ref 0.0–0.7)
EOS PCT: 2 % (ref 0–5)
HCT: 37.2 % — ABNORMAL LOW (ref 39.0–52.0)
HEMOGLOBIN: 12.3 g/dL — AB (ref 13.0–17.0)
LYMPHS PCT: 55 % — AB (ref 12–46)
Lymphs Abs: 4.2 10*3/uL — ABNORMAL HIGH (ref 0.7–4.0)
MCH: 29 pg (ref 26.0–34.0)
MCHC: 33.1 g/dL (ref 30.0–36.0)
MCV: 87.7 fL (ref 78.0–100.0)
MONOS PCT: 7 % (ref 3–12)
Monocytes Absolute: 0.5 10*3/uL (ref 0.1–1.0)
NEUTROS ABS: 2.7 10*3/uL (ref 1.7–7.7)
Neutrophils Relative %: 35 % — ABNORMAL LOW (ref 43–77)
PLATELETS: 242 10*3/uL (ref 150–400)
RBC: 4.24 MIL/uL (ref 4.22–5.81)
RDW: 14.5 % (ref 11.5–15.5)
WBC: 7.6 10*3/uL (ref 4.0–10.5)

## 2014-10-26 LAB — ACETAMINOPHEN LEVEL

## 2014-10-26 LAB — RAPID URINE DRUG SCREEN, HOSP PERFORMED
Amphetamines: NOT DETECTED
BENZODIAZEPINES: NOT DETECTED
Barbiturates: NOT DETECTED
Cocaine: NOT DETECTED
Opiates: NOT DETECTED
TETRAHYDROCANNABINOL: POSITIVE — AB

## 2014-10-26 LAB — SALICYLATE LEVEL

## 2014-10-26 LAB — VALPROIC ACID LEVEL: Valproic Acid Lvl: 11.3 ug/mL — ABNORMAL LOW (ref 50.0–100.0)

## 2014-10-26 LAB — ETHANOL: Alcohol, Ethyl (B): 68 mg/dL — ABNORMAL HIGH (ref 0–9)

## 2014-10-26 MED ORDER — GUAIFENESIN ER 600 MG PO TB12: 600.0000 mg | ORAL_TABLET | Freq: Two times a day (BID) | ORAL | Status: DC | PRN

## 2014-10-26 MED ORDER — ALBUTEROL SULFATE HFA 108 (90 BASE) MCG/ACT IN AERS
2.0000 | INHALATION_SPRAY | Freq: Four times a day (QID) | RESPIRATORY_TRACT | Status: DC | PRN
  Administered 2014-10-26 – 2014-10-28 (×3): 2 via RESPIRATORY_TRACT
  Filled 2014-10-26: qty 6.7

## 2014-10-26 MED ORDER — ABACAVIR SULFATE 300 MG PO TABS
600.0000 mg | ORAL_TABLET | Freq: Every day | ORAL | Status: DC
  Administered 2014-10-26 – 2014-10-29 (×4): 600 mg via ORAL
  Filled 2014-10-26 (×6): qty 2

## 2014-10-26 MED ORDER — ALBUTEROL SULFATE HFA 108 (90 BASE) MCG/ACT IN AERS
2.0000 | INHALATION_SPRAY | RESPIRATORY_TRACT | Status: DC | PRN
  Administered 2014-10-26: 2 via RESPIRATORY_TRACT
  Filled 2014-10-26: qty 6.7

## 2014-10-26 MED ORDER — GABAPENTIN 100 MG PO CAPS
100.0000 mg | ORAL_CAPSULE | Freq: Three times a day (TID) | ORAL | Status: DC
  Administered 2014-10-26 – 2014-10-27 (×4): 100 mg via ORAL
  Filled 2014-10-26 (×8): qty 1

## 2014-10-26 MED ORDER — DIVALPROEX SODIUM ER 500 MG PO TB24
500.0000 mg | ORAL_TABLET | Freq: Two times a day (BID) | ORAL | Status: DC
  Administered 2014-10-26 – 2014-10-29 (×6): 500 mg via ORAL
  Filled 2014-10-26 (×11): qty 1

## 2014-10-26 MED ORDER — DIVALPROEX SODIUM ER 500 MG PO TB24
500.0000 mg | ORAL_TABLET | Freq: Once | ORAL | Status: AC
  Administered 2014-10-26: 500 mg via ORAL
  Filled 2014-10-26: qty 1

## 2014-10-26 MED ORDER — DOLUTEGRAVIR SODIUM 50 MG PO TABS
50.0000 mg | ORAL_TABLET | Freq: Every day | ORAL | Status: DC
  Administered 2014-10-26 – 2014-10-29 (×4): 50 mg via ORAL
  Filled 2014-10-26 (×6): qty 1

## 2014-10-26 MED ORDER — TRAZODONE HCL 50 MG PO TABS: 50.0000 mg | ORAL_TABLET | Freq: Every evening | ORAL | Status: DC | PRN

## 2014-10-26 MED ORDER — DIVALPROEX SODIUM ER 500 MG PO TB24
500.0000 mg | ORAL_TABLET | Freq: Two times a day (BID) | ORAL | Status: DC
Start: 2014-10-26 — End: 2014-10-26

## 2014-10-26 MED ORDER — TRAZODONE HCL 100 MG PO TABS
100.0000 mg | ORAL_TABLET | Freq: Every evening | ORAL | Status: DC | PRN
  Administered 2014-10-26 – 2014-10-28 (×3): 100 mg via ORAL
  Filled 2014-10-26 (×3): qty 1
  Filled 2014-10-26: qty 14

## 2014-10-26 MED ORDER — LAMIVUDINE 150 MG PO TABS
300.0000 mg | ORAL_TABLET | Freq: Every day | ORAL | Status: DC
  Administered 2014-10-26 – 2014-10-29 (×4): 300 mg via ORAL
  Filled 2014-10-26 (×6): qty 2

## 2014-10-26 MED ORDER — ABACAVIR-DOLUTEGRAVIR-LAMIVUD 600-50-300 MG PO TABS
1.0000 | ORAL_TABLET | Freq: Every day | ORAL | Status: DC
  Filled 2014-10-26 (×2): qty 1

## 2014-10-26 MED ORDER — ACETAMINOPHEN 325 MG PO TABS: 650.0000 mg | ORAL_TABLET | Freq: Four times a day (QID) | ORAL | Status: DC | PRN

## 2014-10-26 NOTE — ED Notes (Signed)
Pt now states that he took 10 or more 50mg  Trazodone and same amt of 10mg  Buspar about 10 min after leaving here. (pt eloped from waiting room before being seen)

## 2014-10-26 NOTE — ED Notes (Signed)
Spoke with CJ from [employer] and updated him on pt's lab results and vitals: he stated that he would clear the patient was cleared on their end.
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
## 2014-10-26 NOTE — [facility] Group Notes (Signed)
[facility] LCSW Group Therapy  Feelings Around Relapse 1:15 -2:30        10/26/2014 2:35 PM   Type of Therapy:  Group Therapy  Participation Level:  Appropriate  Participation Quality:  Appropriate  Affect:  Appropriate  Cognitive:  Attentive Appropriate  Insight:  Developing/Improving  Engagement in Therapy: Developing/Improving  Modes of Intervention:  Discussion Exploration Problem-Solving Supportive  Summary of Progress/Problems:  The topic for today was feelings around relapse.    Patient processed feelings toward relapse and was able to relate to peers. He shared if he relapsed he would return to a party life style.  He shared with group he become overwhelmed after learning his brother had been diagnosed with  Huntington's Disease.   Wynn BankerHodnett, Adolph Clutter Hairston 10/26/2014 2:35 PM

## 2014-10-26 NOTE — ED Notes (Signed)
Woke briefly, informed of impending transfer/ad[facility]. Pt agreeable and thanking staff.

## 2014-10-26 NOTE — ED Notes (Signed)
[place] [employer] officer, pt called them to say he had taken 40 tramadol and 3 K-bars. Stated he just wanted to be left alone so he could die. Was found a few blocks from here and brought back in by PD.

## 2014-10-26 NOTE — ED Notes (Signed)
Per security guard, patient eloped from department walking down main street. Went outside hospital to look for patient, patient was not found.

## 2014-10-26 NOTE — Plan of Care (Signed)
Problem: Alteration in mood Goal: LTG-Patient reports reduction in suicidal thoughts (Patient reports reduction in suicidal thoughts and is able to verbalize a safety plan for whenever patient is feeling suicidal) Outcome: Progressing Pt stated he is glad he came for help because he really does not want to die.

## 2014-10-26 NOTE — Progress Notes (Addendum)
Patient ID: Edwin Martinez, male   DOB: [birth year], 29 y.o.   MRN: [id number] Pt is a 29 year old voluntary admit from [facility]. Allergies include: Bactrim. Nexium.PCN and peanuts. Medical hx includes: HIV, bipolar, schizophrenia, asthma and smoker.[facility] with SI thoughts after his brother called and told the pt that he has had Huntingdon's disease since 2003. Pt stated,"I just had a mental breakdown when I heard this." Pt stated he left the hospital AMA and took 20 buspar and 20 trazadone. Pt stated,"I got scared and called 911. They picked me up and took me back to the hospital for treatment." Pt does contract for safety and denies SI and HI. He was given breakfast and was taken to his room. Pt is very pleasant and cooperative. 11am-Pt has a positive cough w/o production. NP made aware

## 2014-10-26 NOTE — ED Notes (Signed)
Report called to Marylu LundJanet, RN for room 405-2 [facility].

## 2014-10-26 NOTE — ED Provider Notes (Signed)
CSN: [id number]     Arrival date & time 10/26/14  0113 History   First MD Initiated Contact with Patient 10/26/14 0136     Chief Complaint  Patient presents with  . V70.1     (Consider location/radiation/quality/duration/timing/severity/associated sxs/prior Treatment) HPI Comments: Patient is a 29 year old male with history of HIV disease, bipolar disorder. He presents here after an overdose. He states that he got some bad news this evening concerning his older brother. His older brother told him that he has Huntington's disease which is prevalent and the patient's family. He states that he became upset by this and took these medications. He tells me this was a suicide attempt.  Patient is a 29 y.o. male presenting with mental health disorder. The history is provided by the patient.  Mental Health Problem Presenting symptoms: suicide attempt   Degree of incapacity (severity):  Moderate Onset quality:  Sudden Duration:  12 hours Timing:  Constant Progression:  Worsening Chronicity:  Recurrent Context: alcohol use   Relieved by:  Nothing Worsened by:  Nothing tried Ineffective treatments:  None tried Associated symptoms: no abdominal pain     Past Medical History  Diagnosis Date  . HIV (human immunodeficiency virus infection)   . Bipolar 1 disorder   . Schizophrenia   . Asthma    Past Surgical History  Procedure Laterality Date  . Dental surgery     Family History  Problem Relation Age of Onset  . Huntington's disease Father   . Heart disease Mother    History  Substance Use Topics  . Smoking status: Current Every Day Smoker -- 0.50 packs/day    Types: Cigarettes    Start date: 09/29/1991  . Smokeless tobacco: Never Used  . Alcohol Use: 40.0 oz/week    80 Not specified per week     Comment: once week     Review of Systems  Gastrointestinal: Negative for abdominal pain.  All other systems reviewed and are negative.     Allergies  Atripla;  Magnesium-containing compounds; Esomeprazole magnesium; Peanuts; Penicillins; and Bactrim  Home Medications   Prior to Admission medications   Medication Sig Start Date End Date Taking? Authorizing Provider  Abacavir-Dolutegravir-Lamivud (TRIUMEQ) 600-50-300 MG TABS Take 1 tablet by mouth daily. 09/14/14   Lyanne CoKevin M Campos, MD  busPIRone (BUSPAR) 10 MG tablet Take 2 tablets (20 mg total) by mouth 2 (two) times daily. For anxiety 09/14/14   Lyanne CoKevin M Campos, MD  divalproex (DEPAKOTE) 500 MG DR tablet Take 1 tablet (500 mg total) by mouth 2 (two) times daily. For mood stabilization 09/14/14   Lyanne CoKevin M Campos, MD  traZODone (DESYREL) 50 MG tablet Take 1 tablet (50 mg total) by mouth at bedtime and may repeat dose one time if needed. For sleep 09/14/14   Lyanne CoKevin M Campos, MD   BP 112/63 mmHg  Pulse 72  Temp(Src) 98.9 F (37.2 C) (Oral)  Resp 20  Ht 5\' 10"  (1.778 m)  Wt 155 lb (70.308 kg)  BMI 22.24 kg/m2  SpO2 96% Physical Exam  Constitutional: He is oriented to person, place, and time. He appears well-developed and well-nourished. No distress.  The odor of alcohol is present.  HENT:  Head: Normocephalic and atraumatic.  Mouth/Throat: Oropharynx is clear and moist.  Eyes: EOM are normal. Pupils are equal, round, and reactive to light.  Neck: Normal range of motion. Neck supple.  Cardiovascular: Normal rate, regular rhythm and normal heart sounds.   No murmur heard. Pulmonary/Chest: Effort normal and breath  sounds normal. No respiratory distress. He has no wheezes. He has no rales. He exhibits no tenderness.  Abdominal: Soft. Bowel sounds are normal. He exhibits no distension. There is no tenderness.  Musculoskeletal: Normal range of motion. He exhibits no edema.  Lymphadenopathy:    He has no cervical adenopathy.  Neurological: He is alert and oriented to person, place, and time. No cranial nerve deficit. He exhibits normal muscle tone. Coordination normal.  Skin: Skin is warm and dry. He is  not diaphoretic.  Nursing note and vitals reviewed.   ED Course  Procedures (including critical care time) Labs Review Labs Reviewed  COMPREHENSIVE METABOLIC PANEL  CBC WITH DIFFERENTIAL/PLATELET  ETHANOL  URINE RAPID DRUG SCREEN (HOSP PERFORMED)  ACETAMINOPHEN LEVEL  SALICYLATE LEVEL    Imaging Review No results found.   Date: 10/26/2014  Rate: 64  Rhythm: normal sinus rhythm  QRS Axis: normal  Intervals: normal  ST/T Wave abnormalities: normal  Conduction Disutrbances:none  Narrative Interpretation:   Old EKG Reviewed: none available    MDM   Final diagnoses:  None    Patient presents after an overdose. He apparently took his BuSpar and trazodone. His workup was otherwise unremarkable and he appears to be medically cleared. He will undergo TTS evaluation and the final disposition will be established once this consultation is complete.  Patient accepted in transfer to behavioral health by Dr. Jama Flavors.  Geoffery Lyons, MD 10/26/14 [phone]

## 2014-10-26 NOTE — BH Assessment (Signed)
Tele Assessment Note   Edwin Martinez is a 29 y.o. male who voluntarily presents to [facility] with SI/Depression. Pt denies HI/AVH. Pt reports the following: pt has been SI x24hrs and has hx of SI thoughts since 2001 when he was dx with bipolar d/o.  Pt states current mental state triggered by his brother's recent dx of Huntington's Disease.  Pt attempted SI by overdosing on 10-15 Buspar and Tramadol Pills.  Pt admits 1 previous SI attempted by overdose.  Pt says his medications are not working and wants them adjusted.  Pt endorses depressive sxs: insomnia, crying spells and anhedonia.    Axis I: Bipolar, Depressed Axis II: Deferred Axis III:  Past Medical History  Diagnosis Date  . HIV (human immunodeficiency virus infection)   . Bipolar 1 disorder   . Schizophrenia   . Asthma    Axis IV: other psychosocial or environmental problems, problems related to social environment and problems with primary support group Axis V: 31-40 impairment in reality testing  Past Medical History:  Past Medical History  Diagnosis Date  . HIV (human immunodeficiency virus infection)   . Bipolar 1 disorder   . Schizophrenia   . Asthma     Past Surgical History  Procedure Laterality Date  . Dental surgery      Family History:  Family History  Problem Relation Age of Onset  . Huntington's disease Father   . Heart disease Mother     Social History:  reports that he has been smoking Cigarettes.  He started smoking about 23 years ago. He has been smoking about 0.50 packs per day. He has never used smokeless tobacco. He reports that he drinks about 40.0 oz of alcohol per week. He reports that he uses illicit drugs (Marijuana) about twice per week.  Additional Social History:  Alcohol / Drug Use Pain Medications: See MAR  Prescriptions: See MAR  Over the Counter: See MAR  History of alcohol / drug use?: Yes Longest period of sobriety (when/how long): Bibe  Negative Consequences of Use: Work / Programmer, multimediachool,  Personal relationships Withdrawal Symptoms: Other (Comment) (No w/d sxs ) Substance #1 Name of Substance 1: THC  1 - Age of First Use: Teens  1 - Amount (size/oz): 1 Blunt  1 - Frequency: Daily  1 - Duration: On-going  1 - Last Use / Amount: 10/25/14  CIWA: CIWA-Ar BP: (!) 117/53 mmHg Pulse Rate: (!) 52 COWS:    PATIENT STRENGTHS: (choose at least two) Motivation for treatment/growth  Allergies:  Allergies  Allergen Reactions  . Atripla [Efavirenz-Emtricitab-Tenofovir] Other (See Comments)    DEPRESSION AND SUICIDE ATTEMPT  . Magnesium-Containing Compounds     MAGNESIUM ,COMPOUNDS CAN REDUCE LEVELS OF DOLUTEGRAVIR SO SHOULD NOT BE COADMIN WITH TIVICAY OR TRIUMEQ  . Esomeprazole Magnesium     REACTION: cough  . Peanuts [Peanut Oil]     Rash   . Penicillins     REACTION: rash  . Bactrim [Sulfamethoxazole-Trimethoprim] Rash    Patient reported    Home Medications:  (Not in a hospital admission)  OB/GYN Status:  No LMP for male patient.  General Assessment Data Location of Assessment: [facility] Is this a Tele or Face-to-Face Assessment?: Tele Assessment Is this an Initial Assessment or a Re-assessment for this encounter?: Initial Assessment Living Arrangements: Spouse/significant other Can pt return to current living arrangement?: Yes Admission Status: Voluntary Is patient capable of signing voluntary admission?: Yes Transfer from: Home Referral Source: Self/Family/Friend  Medical Screening Exam [facility] Walk-in  ONLY) Medical Exam completed: No Reason for MSE not completed: Other: (None )  [facility] Crisis Care Plan Living Arrangements: Spouse/significant other Name of Psychiatrist: None  Name of Therapist: None   Education Status Is patient currently in school?: No Current Grade: None  Name of school: None  Contact person: None   Risk to self with the past 6 months Suicidal Ideation: Yes-Currently Present Suicidal Intent: Yes-Currently Present Is patient at risk  for suicide?: Yes Suicidal Plan?: Yes-Currently Present Specify Current Suicidal Plan: Overdose on buspar/tramadol  Access to Means: Yes Specify Access to Suicidal Means: Medications  What has been your use of drugs/alcohol within the last 12 months?: Pt uses THC  Previous Attempts/Gestures: Yes How many times?: 1 Other Self Harm Risks: None  Triggers for Past Attempts: Unpredictable Intentional Self Injurious Behavior: None Family Suicide History: Yes Recent stressful life event(s): Trauma (Comment) (Brother's recetn dx of Huntington's Disease ) Persecutory voices/beliefs?: No Depression: Yes Depression Symptoms: Loss of interest in usual pleasures, Feeling worthless/self pity Substance abuse history and/or treatment for substance abuse?: Yes Suicide prevention information given to non-admitted patients: Not applicable  Risk to Others within the past 6 months Homicidal Ideation: No Thoughts of Harm to Others: No Current Homicidal Intent: No Current Homicidal Plan: No Access to Homicidal Means: No Identified Victim: None  History of harm to others?: No Assessment of Violence: None Noted Violent Behavior Description: None  Does patient have access to weapons?: No Criminal Charges Pending?: No Does patient have a court date: No  Psychosis Hallucinations: None noted Delusions: None noted  Mental Status Report Appear/Hygiene: In scrubs Eye Contact: Fair Motor Activity: Unremarkable Speech: Logical/coherent Level of Consciousness: Alert Mood: Depressed, Sad Affect: Depressed, Appropriate to circumstance, Sad Anxiety Level: None Thought Processes: Coherent, Relevant Judgement: Impaired Orientation: Person, Place, Time, Situation Obsessive Compulsive Thoughts/Behaviors: None  Cognitive Functioning Concentration: Normal Memory: Recent Intact, Remote Intact IQ: Average Insight: Poor Impulse Control: Poor Appetite: Fair Weight Loss: 0 Weight Gain: 0 Sleep:  Decreased Total Hours of Sleep: 2 Vegetative Symptoms: None  ADLScreening [facility] Assessment Services) Patient's cognitive ability adequate to safely complete daily activities?: Yes Patient able to express need for assistance with ADLs?: Yes Independently performs ADLs?: Yes (appropriate for developmental age)  Prior Inpatient Therapy Prior Inpatient Therapy: Yes Prior Therapy Dates: 2004,2008,2015 Prior Therapy Facilty/Provider(s): [facility], [facility], [facility]  Reason for Treatment: SI/Depression   Prior Outpatient Therapy Prior Outpatient Therapy: No Prior Therapy Dates: None  Prior Therapy Facilty/Provider(s): None  Reason for Treatment: None   ADL Screening (condition at time of admission) Patient's cognitive ability adequate to safely complete daily activities?: Yes Is the patient deaf or have difficulty hearing?: No Does the patient have difficulty seeing, even when wearing glasses/contacts?: No Does the patient have difficulty concentrating, remembering, or making decisions?: No Patient able to express need for assistance with ADLs?: Yes Does the patient have difficulty dressing or bathing?: No Independently performs ADLs?: Yes (appropriate for developmental age) Does the patient have difficulty walking or climbing stairs?: No Weakness of Legs: None Weakness of Arms/Hands: None  Home Assistive Devices/Equipment Home Assistive Devices/Equipment: None  Therapy Consults (therapy consults require a physician order) PT Evaluation Needed: No OT Evalulation Needed: No SLP Evaluation Needed: No Abuse/Neglect Assessment (Assessment to be complete while patient is alone) Physical Abuse: Denies Verbal Abuse: Denies Sexual Abuse: Denies Exploitation of patient/patient's resources: Denies Self-Neglect: Denies Values / Beliefs Spiritual Requests During Hospitalization: None Consults Spiritual Care Consult Needed: No Social Work Consult Needed: No  Advance Directives (For  Healthcare) Does patient have an advance directive?: No Would patient like information on creating an advanced directive?: No - patient declined information    Additional Information 1:1 In Past 12 Months?: No CIRT Risk: No Elopement Risk: No Does patient have medical clearance?: Yes     Disposition:  Disposition Initial Assessment Completed for this Encounter: Yes Disposition of Patient: Inpatient treatment program, Referred to [facility] [place] accepted for tx; ) Type of inpatient treatment program: Adult Patient referred to: Other (Comment) [facility], [place] accepted for tx; )  Murrell Redden 10/26/2014 3:57 AM

## 2014-10-26 NOTE — ED Notes (Signed)
Patient back in waiting area, in vending area

## 2014-10-26 NOTE — [facility] Suicide Risk Assessment (Signed)
[facility]   Nursing information obtained from:  Patient Demographic factors:  Male Current Mental Status:  Self-harm behaviors Loss Factors:    Historical Factors:  Prior suicide attempts, Impulsivity Risk Reduction Factors:  Sense of responsibility to family, Religious beliefs about death, Living with another person, especially a relative Total Time spent with patient: 45 minutes Principal Problem: <principal problem not specified> Diagnosis:   Patient Active Problem List   Diagnosis Date Noted  . Bipolar I disorder, most recent episode depressed [F31.30] 05/24/2014    Priority: High  . GAD (generalized anxiety disorder) [F41.1] 05/24/2014    Priority: High  . MDD (major depressive disorder), recurrent episode, severe [F33.2] 05/23/2014    Priority: High  . Suicidal ideation [R45.851] 09/14/2014  . Depression [F32.9]   . Suicidal ideations [R45.851]   . MDD (major depressive disorder) [F32.2] 05/23/2014  . Suicide attempt [T14.91] 05/23/2014  . Transaminitis [R74.0] 03/20/2014  . ADHD (attention deficit hyperactivity disorder) [F90.9] 09/12/2012  . INSOMNIA, CHRONIC [G47.00] 05/20/2007  . CARPAL TUNNEL SYNDROME [G56.00] 05/20/2007  . 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 HIV DISEASE [B20] 05/05/2007  . 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  World Science writerHealth Organization [facility](WHO). Score between 0-7:  no or low risk or alcohol related problems. Score between 8-15:  moderate risk of alcohol related problems. Score between 16-19:  high risk of alcohol related problems. Score 20 or above:  warrants further diagnostic evaluation for alcohol dependence and treatment.   CLINICAL FACTORS:   Bipolar Disorder:    Depressive phase   Musculoskeletal: Strength & Muscle Tone: within normal limits Gait & Station: normal Patient leans: N/A  Psychiatric Specialty Exam: Physical Exam  Review of Systems  Constitutional: Negative.   Eyes: Positive for blurred vision.  Respiratory: Positive for cough.        Half a pack a day  Cardiovascular: Negative.   Gastrointestinal: Positive for nausea.  Genitourinary: Negative.   Musculoskeletal: Negative.   Skin: Negative.   Neurological: Positive for dizziness and headaches.  Endo/Heme/Allergies: Negative.   Psychiatric/Behavioral: Positive for depression and suicidal ideas. The patient is nervous/anxious and has insomnia.     Blood pressure 120/58, pulse 61, temperature 98 F (36.7 C), temperature source Oral, resp. rate 16, height 5\' 9"  (1.753 m), weight 74.39 kg (164 lb), SpO2 98 %.Body mass index is 24.21 kg/(m^2).  General Appearance: Disheveled  Eye SolicitorContact::  Fair  Speech:  Clear and Coherent and Pressured  Volume:  fluctuates  Mood:  Anxious, Depressed and Dysphoric  Affect:  sad, anxious worried  Thought Process:  Coherent and Goal Directed  Orientation:  Full (Time, Place, and Person)  Thought Content:  events symptoms worries concerns  Suicidal Thoughts:  No  Homicidal Thoughts:  No  Memory:  Immediate;   Fair Recent;   Fair Remote;   Fair  Judgement:  Fair  Insight:  Present and Shallow  Psychomotor Activity:  Restlessness  Concentration:  Fair  Recall:  FiservFair  Fund of Knowledge:Fair  Language: Fair  Akathisia:  No  Handed:  Right  AIMS (if indicated):     Assets:  Desire for Improvement Housing  Sleep:     Cognition: WNL  ADL's:  Intact     COGNITIVE FEATURES THAT CONTRIBUTE TO RISK:  Closed-mindedness, Polarized thinking and Thought constriction (tunnel vision)    SUICIDE RISK:   Moderate:   PLAN OF CARE: Supportive approach/coping skills                               Bipolar Depressed: continue the Depakote ER 500 mg  BID                               Anxiety: states that the Buspar is not helping anymore. We discussed low dose Seroquel or Neurontin. He would rather try Neurontin at this time ( 100 mg TID)  and see how much it can help. If he tolerates well, will increased it to 300 mg TID. Will consider the low dosages of Seroquel if the Neurontin is not effective  Medical Decision Making:  Review of Psycho-Social Stressors (1), Review or order medicine tests (1), Review of Medication Regimen & Side Effects (2) and Review of New Medication or Change in Dosage (2)  I certify that inpatient services furnished can reasonably be expected to improve the patient's condition.   Lear Carstens A 10/26/2014, 2:27 PM

## 2014-10-26 NOTE — ED Notes (Signed)
Woke, used urinal. Fully alert and speech clear.

## 2014-10-26 NOTE — ED Notes (Signed)
[facility] after patient eloped and was found at gas station. Per [employer] patient was A&OX4 talking and ambulatory. Upon arrival in triage patient has eyes closed, but responds to questions by moaning. Per [employer] patient states he took an unknown amount of pills.

## 2014-10-26 NOTE — ED Notes (Signed)
Per Poison Control: For Buspar overdose-pt supposedly took approx 150 mg.  Poison control report half life of buspar is 5.5 hours, possible seizure activity. For Trazadone overdose-pt supposedly took approx 750 mg.  Poison control report half-life of trazadone to be 8.8 hours, possible prolongation of PTC, possible decrease in Potassium and Magnesium levels, possible hypotension and bradycardia.  Recommend observation with replacement of electrolytes as needed, cardiac monitoring, monitor for seizures.

## 2014-10-26 NOTE — H&P (Signed)
Psychiatric Admission Assessment Adult  Patient Identification:  Edwin Martinez Date of Evaluation:  10/26/2014 Chief Complaint:  "I got really depressed after my brother gave me some bad news."  History of Present Illness::   Edwin Martinez is a 29 y.o. male who voluntarily presents to [facility] with suicidal thoughts and depressive symptoms. He reports being diagnosed with Bipolar Disorder since 2001. Patient reports feeling more depressed for the past few days with symptoms such as insomnia, crying spells, and loss of energy. The patient reports the symptoms were acutely worsened upon finding out last night that his brother has been recently diagnosed with Huntington's Disease. Patient states "I got really anxious. I thought how it will all be on me to deal with his bad health. I have already seen three Aunts and my father die from that disease. I took the pill but then felt guilty. I am a spiritual type person and want to go to heaven. So I called the police to come get me. My mood is still unstable. I was feeling too happy earlier last week. I don't think the Buspar is helping me anxiety. But the Depakote is good for me. It was increased last time I was here but it's not really working. I want to try a different medication." The poison control center was contacted after the overdose by [employer] staff. They recommended monitoring for seizures and possible hypotension. So far the patient's vitals have been stable and he is fully oriented. There is no evidence of any impending neurological problems. The patient was last inpatient at [facility] in August of 2015. The patient denies any regular pattern of alcohol use. He admits to drinking two 40 ounces yesterday. Patient denies symptoms of withdrawal. His urine drug screen is positive for marijuana. The patient reports just getting over a cold and having a lingering cough. He denies any fever, chills, or body aches.    Associated Signs/Synptoms: Depression  Symptoms:  depressed mood, anhedonia, insomnia, fatigue, feelings of worthlessness/guilt, difficulty concentrating, suicidal thoughts with specific plan, suicidal attempt, anxiety, loss of energy/fatigue, disturbed sleep, weight gain, (Hypo) Manic Symptoms:  Irritable Mood, Labiality of Mood, Anxiety Symptoms:  Excessive Worry, Panic Symptoms, Psychotic Symptoms:  Denies PTSD Symptoms: Negative Total Time spent with patient: 45 minutes  Psychiatric Specialty Exam: Physical Exam  Constitutional:  Physical exam findings reviewed from the [facility] and I concur with no noted exceptions.     Review of Systems  Constitutional: Negative for fever, chills, weight loss, malaise/fatigue and diaphoresis.  HENT: Positive for congestion. Negative for ear discharge, ear pain, hearing loss, nosebleeds and tinnitus.   Eyes: Negative for blurred vision, double vision, photophobia, pain, discharge and redness.  Respiratory: Positive for cough. Negative for hemoptysis, sputum production, shortness of breath and wheezing.   Cardiovascular: Negative for chest pain, palpitations, orthopnea, claudication, leg swelling and PND.  Gastrointestinal: Negative for heartburn, nausea, vomiting, abdominal pain, diarrhea, constipation, blood in stool and melena.  Genitourinary: Negative for dysuria, urgency, frequency, hematuria and flank pain.  Musculoskeletal: Negative for myalgias, back pain, joint pain, falls and neck pain.  Skin: Negative for itching and rash.  Neurological: Negative for dizziness, tingling, tremors, sensory change, speech change, focal weakness, seizures, loss of consciousness, weakness and headaches.  Endo/Heme/Allergies: Negative for environmental allergies and polydipsia. Does not bruise/bleed easily.  Psychiatric/Behavioral: Positive for depression, suicidal ideas and substance abuse. The patient is nervous/anxious.     Blood pressure 120/58, pulse 61, temperature 98 F (36.7 C),  temperature  source Oral, resp. rate 16, height _0  (1.753 m), weight 74.39 kg (164 lb), SpO2 98 %.Body mass index is 24.21 kg/(m^2).  General Appearance: Fairly Groomed  Engineer, water::  Fair  Speech:  Clear and Coherent  Volume:  Decreased  Mood:  Anxious and Depressed  Affect:  Congruent  Thought Process:  Coherent and Goal Directed  Orientation:  Full (Time, Place, and Person)  Thought Content:  events, symptoms worries concerns  Suicidal Thoughts:  Yes.  without intent/plan  Homicidal Thoughts:  No  Memory:  Immediate;   Fair Recent;   Fair Remote;   Fair  Judgement:  Fair  Insight:  Present  Psychomotor Activity:  Restlessness  Concentration:  Fair  Recall:  [employer] of Knowledge:Good  Language: Fair  Akathisia:  No  Handed:  Right  AIMS (if indicated):     Assets:  Desire for Improvement Housing Social Support  Sleep:      Musculoskeletal: Strength & Muscle Tone: within normal limits Gait & Station: normal Patient leans: N/A  Past Psychiatric History: Yes Diagnosis: Bipolar mixed  Hospitalizations: [facility] 2001, [facility] X 3, [facility] 2014,   Outpatient Care: [facility] in [place] then moved to [place]  Substance Abuse Care: Denies  Self-Mutilation: Yes  Suicidal Attempts: Yes  Violent Behaviors Yes   Past Medical History:   Past Medical History  Diagnosis Date  . HIV (human immunodeficiency virus infection)   . Bipolar 1 disorder   . Schizophrenia   . Asthma    Allergies:   Allergies  Allergen Reactions  . Atripla [Efavirenz-Emtricitab-Tenofovir] Other (See Comments)    DEPRESSION AND SUICIDE ATTEMPT  . Magnesium-Containing Compounds     MAGNESIUM ,COMPOUNDS CAN REDUCE LEVELS OF DOLUTEGRAVIR SO SHOULD NOT BE COADMIN WITH TIVICAY OR TRIUMEQ  . Esomeprazole Magnesium     REACTION: cough  . Peanuts [Peanut Oil]     Rash   . Penicillins     REACTION: rash  . Bactrim [Sulfamethoxazole-Trimethoprim] Rash    Patient reported   PTA  Medications: Prescriptions prior to admission  Medication Sig Dispense Refill Last Dose  . Abacavir-Dolutegravir-Lamivud (TRIUMEQ) 600-50-300 MG TABS Take 1 tablet by mouth daily. 30 tablet 3 10/25/2014 at Unknown time  . albuterol (PROVENTIL HFA;VENTOLIN HFA) 108 (90 BASE) MCG/ACT inhaler Inhale 2 puffs into the lungs every 6 (six) hours as needed for wheezing or shortness of breath.     . busPIRone (BUSPAR) 10 MG tablet Take 2 tablets (20 mg total) by mouth 2 (two) times daily. For anxiety 120 tablet 5 10/25/2014 at Unknown time  . divalproex (DEPAKOTE) 500 MG DR tablet Take 1 tablet (500 mg total) by mouth 2 (two) times daily. For mood stabilization 60 tablet 5 10/25/2014 at Unknown time  . traZODone (DESYREL) 50 MG tablet Take 1 tablet (50 mg total) by mouth at bedtime and may repeat dose one time if needed. For sleep 60 tablet 5 10/25/2014 at Unknown time    Previous Psychotropic Medications:  Medication/Dose    Buspar, Seroquel, Wellbutrin, Latuda, Trazodone, Depakote,              Substance Abuse History in the last 12 months: Reports being a "social drinker", admits to drinking, His UDS is positive for marijuana, which he reports using about twice a month.  Consequences of Substance Abuse: Negative  Social History:  reports that he has been smoking Cigarettes.  He started smoking about 23 years ago. He has been smoking about 0.50 packs per day.  He has never used smokeless tobacco. He reports that he drinks about 40.0 oz of alcohol per week. He reports that he uses illicit drugs (Marijuana) about twice per week. 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 Legal History: One felony "malicious conduct"  Other: shoplifting, assault all misdemeanors  Hobbies/Interests:  Family History:   Family History  Problem Relation Age of Onset  . Huntington's disease Father   . Heart disease Mother   Mother depression, both parents addictions  Results for orders placed or performed during the hospital encounter of 10/26/14 (from the past 31 hour(s))  Urine rapid drug screen (hosp performed)     Status: Abnormal   Collection Time: 10/26/14  1:38 AM  Result Value Ref Range   Opiates NONE DETECTED NONE DETECTED   Cocaine NONE DETECTED NONE DETECTED   Benzodiazepines NONE DETECTED NONE DETECTED   Amphetamines NONE DETECTED NONE DETECTED   Tetrahydrocannabinol POSITIVE (A) NONE DETECTED   Barbiturates NONE DETECTED NONE DETECTED    Comment:        DRUG SCREEN FOR MEDICAL PURPOSES ONLY.  IF CONFIRMATION IS NEEDED FOR ANY PURPOSE, NOTIFY LAB WITHIN 5 DAYS.        LOWEST DETECTABLE LIMITS FOR URINE DRUG SCREEN Drug Class       Cutoff (ng/mL) Amphetamine      1000 Barbiturate      200 Benzodiazepine   578 Tricyclics       469 Opiates          300 Cocaine          300 THC              50   Comprehensive metabolic panel     Status: Abnormal   Collection Time: 10/26/14  1:55 AM  Result Value Ref Range   Sodium 140 135 - 145 mmol/L   Potassium 3.4 (L) 3.5 - 5.1 mmol/L   Chloride 111 96 - 112 mmol/L   CO2 23 19 - 32 mmol/L   Glucose, Bld 87 70 - 99 mg/dL   BUN 9 6 - 23 mg/dL   Creatinine, Ser 0.78 0.50 - 1.35 mg/dL   Calcium 8.5 8.4 - 10.5 mg/dL   Total Protein 7.2 6.0 - 8.3 g/dL   Albumin 3.7 3.5 - 5.2 g/dL   AST 21 0 - 37 U/L   ALT 20 0 - 53 U/L   Alkaline  Phosphatase 76 39 - 117 U/L   Total Bilirubin 0.3 0.3 - 1.2 mg/dL   GFR calc non Af Amer >90 >90 mL/min   GFR calc Af Amer >90 >90 mL/min    Comment: (NOTE) The eGFR has been calculated using the CKD EPI equation. This calculation has not been validated in all clinical situations. eGFR's persistently <90 mL/min signify possible Chronic Kidney Disease.    Anion gap 6 5 - 15  CBC with Differential     Status: Abnormal   Collection Time: 10/26/14  1:55 AM  Result Value Ref Range   WBC 7.6 4.0 - 10.5 K/uL   RBC 4.24 4.22 - 5.81 MIL/uL   Hemoglobin 12.3 (L) 13.0 - 17.0 g/dL   HCT 84.5 (L) 36.4 - 68.0 %   MCV 87.7 78.0 - 100.0 fL   MCH 29.0 26.0 - 34.0 pg   MCHC 33.1 30.0 - 36.0 g/dL   RDW 32.1 22.4 - 82.5 %   Platelets 242 150 - 400 K/uL   Neutrophils Relative % 35 (L) 43 - 77 %   Neutro Abs 2.7 1.7 - 7.7 K/uL   Lymphocytes Relative 55 (H) 12 - 46 %   Lymphs Abs 4.2 (H) 0.7 - 4.0 K/uL   Monocytes Relative 7 3 - 12 %   Monocytes Absolute 0.5 0.1 - 1.0 K/uL   Eosinophils Relative 2 0 - 5 %   Eosinophils Absolute 0.2 0.0 - 0.7 K/uL   Basophils Relative 1 0 - 1 %   Basophils Absolute 0.0 0.0 - 0.1 K/uL  Ethanol     Status: Abnormal   Collection Time: 10/26/14  1:55 AM  Result Value Ref Range   Alcohol, Ethyl (B) 68 (H) 0 - 9 mg/dL    Comment:        LOWEST DETECTABLE LIMIT FOR SERUM ALCOHOL IS 11 mg/dL FOR MEDICAL PURPOSES ONLY   Acetaminophen level     Status: Abnormal   Collection Time: 10/26/14  1:55 AM  Result Value Ref Range   Acetaminophen (Tylenol), Serum <10.0 (L) 10 - 30 ug/mL    Comment:        THERAPEUTIC CONCENTRATIONS VARY SIGNIFICANTLY. A RANGE OF 10-30 ug/mL MAY BE AN EFFECTIVE CONCENTRATION FOR MANY PATIENTS. HOWEVER, SOME ARE BEST TREATED AT CONCENTRATIONS OUTSIDE THIS RANGE. ACETAMINOPHEN CONCENTRATIONS >150 ug/mL AT 4 HOURS AFTER INGESTION AND >50 ug/mL AT 12 HOURS AFTER INGESTION ARE OFTEN ASSOCIATED WITH TOXIC REACTIONS.   Salicylate level      Status: None   Collection Time: 10/26/14  1:55 AM  Result Value Ref Range   Salicylate Lvl <4.0 2.8 - 20.0 mg/dL  Valproic acid level     Status: Abnormal   Collection Time: 10/26/14  1:55 AM  Result Value Ref Range   Valproic Acid Lvl 11.3 (L) 50.0 - 100.0 ug/mL   Psychological Evaluations:  Assessment:   DSM5: Primary Psychiatric Disorder-Bipolar 1 Disorder, most recent episode depressed  Treatment Plan Summary: Daily contact with patient to assess and evaluate symptoms and progress in treatment Medication management Current Medications:  Current Facility-Administered Medications  Medication Dose Route Frequency Provider Last Rate Last Dose  . abacavir (ZIAGEN) tablet 600 mg  600 mg Oral Daily Rockey Situ Cobos, MD   600 mg at 10/26/14 1526   And  . lamiVUDine (EPIVIR) tablet 300 mg  300 mg Oral Daily Rockey Situ Cobos, MD   300 mg at 10/26/14 1526   And  . dolutegravir (TIVICAY) tablet 50 mg  50 mg Oral Daily Craige Cotta, MD   50 mg at 10/26/14 1525  . acetaminophen (TYLENOL) tablet 650 mg  650 mg Oral Q6H PRN Fransisca Kaufmann, NP      . albuterol (PROVENTIL HFA;VENTOLIN HFA) 108 (90 BASE) MCG/ACT inhaler 2 puff  2 puff Inhalation Q6H PRN Rachael Fee, MD      . divalproex (DEPAKOTE ER) 24 hr tablet 500 mg  500 mg Oral BID Reymundo Poll  Sabra Heck, MD      . gabapentin (NEURONTIN) capsule 100 mg  100 mg Oral TID Nicholaus Bloom, MD   100 mg at 10/26/14 1525  . guaiFENesin (MUCINEX) 12 hr tablet 600 mg  600 mg Oral BID PRN Elmarie Shiley, NP      . traZODone (DESYREL) tablet 100 mg  100 mg Oral QHS PRN Nicholaus Bloom, MD        Observation Level/Precautions:  15 minute checks  Laboratory:  As per the ED  Psychotherapy:  Individual/group  Medications:  Restart Depakote ER 500 mg BID for improved mood stability, Start Neurontin 100 mg TID for anxiety, Mucinex bid prn for cough  Consultations:  As needed  Discharge Concerns:  Safety and Stability   Estimated LOS: 3-5 days  Other:  Increase  collateral information from family    I certify that inpatient services furnished can reasonably be expected to improve the patient's condition.   Elmarie Shiley NP-C 1/29/20164:15 PM  I personally assessed the patient, reviewed the physical exam and labs and formulated the treatment plan Geralyn Flash A. Sabra Heck, M.D.

## 2014-10-26 NOTE — ED Notes (Signed)
Patient out to smoke a cigarette

## 2014-10-26 NOTE — [facility] Suicide Risk Assessment (Addendum)
[facility] INPATIENT:  Family/Significant Other Suicide Prevention Education  Suicide Prevention Education:  Contact Attempts:  Edwin Martinez, Partner, [phone]; has been identified by the patient as the family member/significant other with whom the patient will be residing, and identified as the person(s) who will aid the patient in the event of a mental health crisis.  With written consent from the patient, two attempts were made to provide suicide prevention education, prior to and/or following the patient's discharge.  We were unsuccessful in providing suicide prevention education.  A suicide education pamphlet was given to the patient to share with family/significant other.  Date and time of first attempt:  Friday, October 26, 2014 at 4:05 PM.  Unable to leave a message on voicemail. Date and time of second attempt:  Monday, October 29, 2014 at 12 Noon.  Message left on voicemail.  Edwin Martinez, Edwin Martinez 10/26/2014, 4:08 PM

## 2014-10-26 NOTE — ED Notes (Signed)
Patient denies having a suicidal plan or self injury tonight. Patient states "i just need to talk to my boyfriend, and know hes going to be there for me"

## 2014-10-27 MED ORDER — GABAPENTIN 100 MG PO CAPS
200.0000 mg | ORAL_CAPSULE | Freq: Three times a day (TID) | ORAL | Status: DC
  Administered 2014-10-27 – 2014-10-28 (×2): 200 mg via ORAL
  Filled 2014-10-27 (×8): qty 2

## 2014-10-27 MED ORDER — INFLUENZA VAC SPLIT QUAD 0.5 ML IM SUSY
0.5000 mL | PREFILLED_SYRINGE | INTRAMUSCULAR | Status: AC
  Administered 2014-10-28: 0.5 mL via INTRAMUSCULAR
  Filled 2014-10-27: qty 0.5

## 2014-10-27 MED ORDER — PNEUMOCOCCAL VAC POLYVALENT 25 MCG/0.5ML IJ INJ
0.5000 mL | INJECTION | INTRAMUSCULAR | Status: AC
  Administered 2014-10-28: 0.5 mL via INTRAMUSCULAR

## 2014-10-27 NOTE — Progress Notes (Signed)
[facility] MD Progress Note  10/27/2014 8:59 PM Edwin Martinez  MRN:  [id number] Subjective:  Edwin Martinez has not heard from his BF. States he wants to pursue the relationship but that it is creating a lot of stress for him. Still feels very anxious, worried. He has some suicidal thoughts when he gets upset about what is going on. He is experiencing anxiety, agitation requested an increase in the neurontin Principal Problem: Bipolar I disorder, most recent episode depressed Diagnosis:   Patient Active Problem List   Diagnosis Date Noted  . Bipolar I disorder, most recent episode depressed [F31.30] 05/24/2014    Priority: High  . GAD (generalized anxiety disorder) [F41.1] 05/24/2014    Priority: High  . MDD (major depressive disorder), recurrent episode, severe [F33.2] 05/23/2014    Priority: High  . Suicidal ideation [R45.851] 09/14/2014  . Depression [F32.9]   . Suicidal ideations [R45.851]   . MDD (major depressive disorder) [F32.2] 05/23/2014  . Suicide attempt [T14.91] 05/23/2014  . Transaminitis [R74.0] 03/20/2014  . ADHD (attention deficit hyperactivity disorder) [F90.9] 09/12/2012  . INSOMNIA, CHRONIC [G47.00] 05/20/2007  . CARPAL TUNNEL SYNDROME [G56.00] 05/20/2007  . 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 HIV DISEASE [B20] 05/05/2007  . 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 HIV (human immunodeficiency virus infection)   . Bipolar 1 disorder   . Schizophrenia   . Asthma     Past Surgical History  Procedure Laterality Date  . Dental surgery     Family History:  Family History  Problem Relation Age of Onset  . Huntington's disease Father   . Heart disease Mother    Social History:  History  Alcohol Use  . 40.0 oz/week  . 80 Not specified per week    Comment: once week      History  Drug Use  . 2.00 per week  .  Special: Marijuana    Comment: every other day     History   Social History  . Marital Status: Single    Spouse Name: N/A    Number of Children: N/A  . Years of Education: N/A   Social History Main Topics  . Smoking status: Current Every Day Smoker -- 0.50 packs/day    Types: Cigarettes    Start date: 09/29/1991  . Smokeless tobacco: Never Used  . Alcohol Use: 40.0 oz/week    80 Not specified per week     Comment: once week   . Drug Use: 2.00 per week    Special: Marijuana     Comment: every other day   . Sexual Activity: Yes    Birth Control/ Protection: Condom   Other Topics Concern  . None   Social History Narrative   Additional History:    Sleep: Poor  Appetite:  Fair   Assessment:   Musculoskeletal: Strength & Muscle Tone: within normal limits Gait & Station: normal Patient leans: N/A   Psychiatric Specialty Exam: Physical Exam  Review of Systems  Constitutional: Negative.   HENT: Negative.   Eyes: Negative.   Respiratory: Negative.   Cardiovascular: Negative.   Gastrointestinal: Negative.   Genitourinary: Negative.   Musculoskeletal: Negative.   Skin: Negative.   Neurological: Negative.   Endo/Heme/Allergies: Negative.   Psychiatric/Behavioral: Positive for depression. The patient is nervous/anxious and has insomnia.     Blood pressure 145/75, pulse  89, temperature 98.2 F (36.8 C), temperature source Oral, resp. rate 17, height 5' 9"  (1.753 m), weight 74.39 kg (164 lb), SpO2 98 %.Body mass index is 24.21 kg/(m^2).  General Appearance: Fairly Groomed  Engineer, water::  Fair  Speech:  Clear and Coherent and Pressured  Volume:  Increased  Mood:  Anxious, Depressed and worried  Affect:  Labile  Thought Process:  Coherent and Goal Directed  Orientation:  Full (Time, Place, and Person)  Thought Content:  symtpoms events worries concerns  Suicidal Thoughts:  Intermittent  Homicidal Thoughts:  No  Memory:  Immediate;   Fair Recent;    Fair Remote;   Fair  Judgement:  Fair  Insight:  Present  Psychomotor Activity:  Restlessness  Concentration:  Fair  Recall:  [employer] of Knowledge:Fair  Language: Fair  Akathisia:  No  Handed:  Right  AIMS (if indicated):     Assets:  Desire for Improvement  ADL's:  Intact  Cognition: WNL  Sleep:        Current Medications: Current Facility-Administered Medications  Medication Dose Route Frequency Provider Last Rate Last Dose  . abacavir (ZIAGEN) tablet 600 mg  600 mg Oral Daily Jenne Campus, MD   600 mg at 10/27/14 2836   And  . lamiVUDine (EPIVIR) tablet 300 mg  300 mg Oral Daily Jenne Campus, MD   300 mg at 10/27/14 0804   And  . dolutegravir (TIVICAY) tablet 50 mg  50 mg Oral Daily Jenne Campus, MD   50 mg at 10/27/14 0819  . acetaminophen (TYLENOL) tablet 650 mg  650 mg Oral Q6H PRN Elmarie Shiley, NP      . albuterol (PROVENTIL HFA;VENTOLIN HFA) 108 (90 BASE) MCG/ACT inhaler 2 puff  2 puff Inhalation Q6H PRN Nicholaus Bloom, MD   2 puff at 10/26/14 2306  . divalproex (DEPAKOTE ER) 24 hr tablet 500 mg  500 mg Oral BID Nicholaus Bloom, MD   500 mg at 10/27/14 0804  . gabapentin (NEURONTIN) capsule 200 mg  200 mg Oral TID Nicholaus Bloom, MD   200 mg at 10/27/14 1710  . guaiFENesin (MUCINEX) 12 hr tablet 600 mg  600 mg Oral BID PRN Elmarie Shiley, NP      . Derrill Memo ON 10/28/2014] Influenza vac split quadrivalent PF (FLUARIX) injection 0.5 mL  0.5 mL Intramuscular Tomorrow-1000 Jenne Campus, MD      . Derrill Memo ON 10/28/2014] pneumococcal 23 valent vaccine (PNU-IMMUNE) injection 0.5 mL  0.5 mL Intramuscular Tomorrow-1000 Myer Peer Cobos, MD      . traZODone (DESYREL) tablet 100 mg  100 mg Oral QHS PRN Nicholaus Bloom, MD   100 mg at 10/26/14 2203    Lab Results:  Results for orders placed or performed during the hospital encounter of 10/26/14 (from the past 48 hour(s))  Urine rapid drug screen (hosp performed)     Status: Abnormal   Collection Time: 10/26/14  1:38 AM   Result Value Ref Range   Opiates NONE DETECTED NONE DETECTED   Cocaine NONE DETECTED NONE DETECTED   Benzodiazepines NONE DETECTED NONE DETECTED   Amphetamines NONE DETECTED NONE DETECTED   Tetrahydrocannabinol POSITIVE (A) NONE DETECTED   Barbiturates NONE DETECTED NONE DETECTED    Comment:        DRUG SCREEN FOR MEDICAL PURPOSES ONLY.  IF CONFIRMATION IS NEEDED FOR ANY PURPOSE, NOTIFY LAB WITHIN 5 DAYS.        LOWEST DETECTABLE LIMITS FOR URINE  DRUG SCREEN Drug Class       Cutoff (ng/mL) Amphetamine      1000 Barbiturate      200 Benzodiazepine   824 Tricyclics       235 Opiates          300 Cocaine          300 THC              50   Comprehensive metabolic panel     Status: Abnormal   Collection Time: 10/26/14  1:55 AM  Result Value Ref Range   Sodium 140 135 - 145 mmol/L   Potassium 3.4 (L) 3.5 - 5.1 mmol/L   Chloride 111 96 - 112 mmol/L   CO2 23 19 - 32 mmol/L   Glucose, Bld 87 70 - 99 mg/dL   BUN 9 6 - 23 mg/dL   Creatinine, Ser 0.78 0.50 - 1.35 mg/dL   Calcium 8.5 8.4 - 10.5 mg/dL   Total Protein 7.2 6.0 - 8.3 g/dL   Albumin 3.7 3.5 - 5.2 g/dL   AST 21 0 - 37 U/L   ALT 20 0 - 53 U/L   Alkaline Phosphatase 76 39 - 117 U/L   Total Bilirubin 0.3 0.3 - 1.2 mg/dL   GFR calc non Af Amer >90 >90 mL/min   GFR calc Af Amer >90 >90 mL/min    Comment: (NOTE) The eGFR has been calculated using the CKD EPI equation. This calculation has not been validated in all clinical situations. eGFR's persistently <90 mL/min signify possible Chronic Kidney Disease.    Anion gap 6 5 - 15  CBC with Differential     Status: Abnormal   Collection Time: 10/26/14  1:55 AM  Result Value Ref Range   WBC 7.6 4.0 - 10.5 K/uL   RBC 4.24 4.22 - 5.81 MIL/uL   Hemoglobin 12.3 (L) 13.0 - 17.0 g/dL   HCT 37.2 (L) 39.0 - 52.0 %   MCV 87.7 78.0 - 100.0 fL   MCH 29.0 26.0 - 34.0 pg   MCHC 33.1 30.0 - 36.0 g/dL   RDW 14.5 11.5 - 15.5 %   Platelets 242 150 - 400 K/uL   Neutrophils Relative %  35 (L) 43 - 77 %   Neutro Abs 2.7 1.7 - 7.7 K/uL   Lymphocytes Relative 55 (H) 12 - 46 %   Lymphs Abs 4.2 (H) 0.7 - 4.0 K/uL   Monocytes Relative 7 3 - 12 %   Monocytes Absolute 0.5 0.1 - 1.0 K/uL   Eosinophils Relative 2 0 - 5 %   Eosinophils Absolute 0.2 0.0 - 0.7 K/uL   Basophils Relative 1 0 - 1 %   Basophils Absolute 0.0 0.0 - 0.1 K/uL  Ethanol     Status: Abnormal   Collection Time: 10/26/14  1:55 AM  Result Value Ref Range   Alcohol, Ethyl (B) 68 (H) 0 - 9 mg/dL    Comment:        LOWEST DETECTABLE LIMIT FOR SERUM ALCOHOL IS 11 mg/dL FOR MEDICAL PURPOSES ONLY   Acetaminophen level     Status: Abnormal   Collection Time: 10/26/14  1:55 AM  Result Value Ref Range   Acetaminophen (Tylenol), Serum <10.0 (L) 10 - 30 ug/mL    Comment:        THERAPEUTIC CONCENTRATIONS VARY SIGNIFICANTLY. A RANGE OF 10-30 ug/mL MAY BE AN EFFECTIVE CONCENTRATION FOR MANY PATIENTS. HOWEVER, SOME ARE BEST TREATED AT CONCENTRATIONS OUTSIDE THIS RANGE. ACETAMINOPHEN  CONCENTRATIONS >150 ug/mL AT 4 HOURS AFTER INGESTION AND >50 ug/mL AT 12 HOURS AFTER INGESTION ARE OFTEN ASSOCIATED WITH TOXIC REACTIONS.   Salicylate level     Status: None   Collection Time: 10/26/14  1:55 AM  Result Value Ref Range   Salicylate Lvl <2.5 2.8 - 20.0 mg/dL  Valproic acid level     Status: Abnormal   Collection Time: 10/26/14  1:55 AM  Result Value Ref Range   Valproic Acid Lvl 11.3 (L) 50.0 - 100.0 ug/mL    Physical Findings: AIMS: Facial and Oral Movements Muscles of Facial Expression: None, normal Lips and Perioral Area: None, normal Jaw: None, normal Tongue: None, normal,Extremity Movements Upper (arms, wrists, hands, fingers): None, normal Lower (legs, knees, ankles, toes): None, normal, Trunk Movements Neck, shoulders, hips: None, normal, Overall Severity Severity of abnormal movements (highest score from questions above): None, normal Incapacitation due to abnormal movements: None,  normal Patient's awareness of abnormal movements (rate only patient's report): No Awareness, Dental Status Current problems with teeth and/or dentures?: No Does patient usually wear dentures?: No  CIWA:  CIWA-Ar Total: 0 COWS:     Treatment Plan Summary: Daily contact with patient to assess and evaluate symptoms and progress in treatment and Medication management Supportive approach/coping skills Bipolar Disorder continue Depakote and get a Depakote level Anxiety, agitation: will increase the Neurontin to 200 mg TID with plans to increase further to 300 mg TID Will work with CBT;mindfulness to  cope better with what is going on  Medical Decision Making:  Review of Psycho-Social Stressors (1), Review or order clinical lab tests (1), Review of Medication Regimen & Side Effects (2) and Review of New Medication or Change in Dosage (2)     Jolea Dolle A 10/27/2014, 8:59 PM

## 2014-10-27 NOTE — Tx Team (Signed)
Initial Interdisciplinary Treatment Plan   PATIENT STRESSORS: Health problems Marital or family conflict Medication change or noncompliance   PATIENT STRENGTHS: General fund of knowledge Motivation for treatment/growth   PROBLEM LIST: Problem List/Patient Goals Date to be addressed Date deferred Reason deferred Estimated date of resolution  "work on Pharmacologistcoping skills and take my medications"                                                       DISCHARGE CRITERIA:  Ability to meet basic life and health needs Improved stabilization in mood, thinking, and/or behavior Verbal commitment to aftercare and medication compliance  PRELIMINARY DISCHARGE PLAN: Attend aftercare/continuing care group Return to previous living arrangement  PATIENT/FAMIILY INVOLVEMENT: This treatment plan has been presented to and reviewed with the patient, [name] A Armenti, and/or family member, .  The patient and family have been given the opportunity to ask questions and make suggestions.  Andrena Mewsuttall, Cotton Beckley J 10/27/2014, 4:52 AM

## 2014-10-27 NOTE — Progress Notes (Signed)
Pt alert and cooperative. Affect/mood depressed and anxious. -SI/HI, verbally contracts for safety. -A/Vhall. Visible in dayroom, interactive with peers. Emotional support and encouragement given. Will continue to monitor closely and evaluate for stabilization.

## 2014-10-27 NOTE — Plan of Care (Signed)
Problem: Alteration in mood Goal: STG-Patient is able to discuss feelings and issues (Patient is able to discuss feelings and issues leading to depression)  Outcome: Progressing Patient able to discuss his feelings regarding the stress of breakup with his bf as well as other precipitating events.   Problem: Diagnosis: Increased Risk For Suicide Attempt Goal: STG-Patient Will Comply With Medication Regime Outcome: Progressing Patient has been med compliant thus far.

## 2014-10-27 NOTE — [facility] Group Notes (Signed)
[facility] Group Notes:  (Nursing/MHT/Case Management/Adjunct)  Date:  10/27/2014  Time:  9:30am  Type of Therapy:  Nurse Education - Goal Setting/Coping Skills  Participation Level:  Did Not Attend  Participation Quality:      Affect:    Cognitive:    Insight:    Engagement in Group:    Modes of Intervention:    Summary of Progress/Problems: Patient remained in bed asleep.  Lawrence MarseillesFriedman, Parlee Amescua Eakes 10/27/2014, 5:45 PM

## 2014-10-27 NOTE — Progress Notes (Signed)
D   Pt is cooperative and pleasant    He interacts well with others and has been compliant with medications   Pt contracts for safety and continues to endorse some ideation   Pt has been observed in his room laying in bed having a loud and angry conversation with himself   A   Verbal support given  Medications administered and effectiveness monitored   Q 15 min checks R   Pt safe at present

## 2014-10-27 NOTE — Progress Notes (Addendum)
[facility] Group Notes:  (Nursing/MHT/Case Management/Adjunct)  Date:  10/27/2014  Time:  2030  Type of Therapy:  wrap up group  Participation Level:  Active  Participation Quality:  Appropriate, Attentive, Sharing and Supportive  Affect:  Excited  Cognitive:  Alert  Insight:  Lacking  Engagement in Group:  Distracting, Engaged and Supportive  Modes of Intervention:  Clarification, Education and Support  Summary of Progress/Problems: Pt shared that he felt better after being able to talk/vent to a nursing student for four hours today. Pt reports hitting rock bottom but is focused on boyfriend and confronting boyfriend about not answering phone rather than caring for self. Pt did say that he wants to be positive and supportive for his brother whom just got diagnosed with Huntington's disease. This disease has affected many of the patients loved ones and pt was unable to deal with the news when first received.   Shelah LewandowskySquires, Felica Chargois Carol 10/27/2014, 10:15 PM

## 2014-10-27 NOTE — Progress Notes (Addendum)
Patient up and visible on the unit. Fidgety and restless at times but interacting appropriately with peers and staff. Speech can be rapid, mood anxious, depressed. Patient spoke of breakup with bf and how hurt he is that ex will no longer take his calls. States he plans to leave from here and arrive at ex's work to force him to talk. Patient offered emotional support. Gently suggested patient give ex-bf space and time to work through things. Medicated patient per orders. Asked patient about night staff's report that patient was talking loudly to self last night. He reports this is of comfort to him since his ex-bf will not engage with him. Denies any AVH.Patient rates his depression at a 7.5/10, hopelessness at a 7.5/10 and anxiety at a 6.5/10. His goal is to "stay calm and focused" and he plans to meet his goal by not allowing negative thinking. He denies SI/HI and remains safe. Lawrence MarseillesFriedman, Flor Whitacre Eakes

## 2014-10-27 NOTE — [facility] Group Notes (Signed)
[facility] Group Notes: (Clinical Social Work)   10/27/2014      Type of Therapy:  Group Therapy   Participation Level:  Did Not Attend - was invited by MHT, but did not come   Ambrose MantleMareida Grossman-Orr, LCSW 10/27/2014, 2:40 PM

## 2014-10-28 MED ORDER — GABAPENTIN 300 MG PO CAPS
300.0000 mg | ORAL_CAPSULE | Freq: Three times a day (TID) | ORAL | Status: DC
  Filled 2014-10-28 (×2): qty 1

## 2014-10-28 MED ORDER — GABAPENTIN 300 MG PO CAPS: 300.0000 mg | ORAL_CAPSULE | Freq: Three times a day (TID) | ORAL | Status: DC

## 2014-10-28 MED ORDER — GABAPENTIN 300 MG PO CAPS
300.0000 mg | ORAL_CAPSULE | Freq: Three times a day (TID) | ORAL | Status: DC
  Administered 2014-10-28 – 2014-10-29 (×4): 300 mg via ORAL
  Filled 2014-10-28 (×9): qty 1

## 2014-10-28 NOTE — Progress Notes (Signed)
Pt alert, pleasant and cooperative. Affect/mood brighter today but continues to report depression. -SI/HI, verbally contracts for safety. -A/Vhall. Visible in dayroom, noted to be laughing and interacting with peers. Emotional support and encouragement given. Will continue to monitor closely and evaluate for stabilization.

## 2014-10-28 NOTE — [facility] Group Notes (Signed)
[facility] Group Notes:  (Nursing/MHT/Case Management/Adjunct)  Date:  10/28/2014  Time:  0930 am  Type of Therapy:  Psychoeducational Skills  Participation Level:  Did Not Attend   Cranford MonBeaudry, Lonette Stevison Evans 10/28/2014, 10:32 AM

## 2014-10-28 NOTE — [facility] Group Notes (Signed)
[facility] Group Notes:  (Clinical Social Work)  10/28/2014  10:00-11:00AM  Summary of Progress/Problems:   The main focus of today's process group was to   1)  discuss the importance of adding supports  2)  define health supports versus unhealthy supports  3)  identify the patient's current unhealthy supports and plan how to handle them  4)  Identify the patient's current healthy supports and plan what to add.  An emphasis was placed on using counselor, doctor, therapy groups, 12-step groups, and problem-specific support groups to expand supports.    The patient expressed full comprehension of the concepts presented, and agreed that there is a need to add more supports.  The patient stated that his twin brother, and godmother/godfather are his main healthy supports.  He no longer has unhealthy supports, as he has distanced himself from all of them, and was able to give other patients helpful suggestions for how to positively talk to others about their needs.  Type of Therapy:  Process Group with Motivational Interviewing  Participation Level:  Active  Participation Quality:  Attentive, Sharing and Supportive  Affect:  Blunted  Cognitive:  Alert and Appropriate  Insight:  Engaged  Engagement in Therapy:  Engaged  Modes of Intervention:   Education, Support and Processing, Activity  Pilgrim's PrideMareida Grossman-Orr, LCSW 10/28/2014, 12:15pm

## 2014-10-28 NOTE — Progress Notes (Signed)
Adult Psychoeducational Group Note  Date:  10/28/2014 Time:  3:27 PM  Group Topic/Focus:  Therapeutic Activity   Participation Level:  Active  Participation Quality:  Appropriate  Affect:  Appropriate  Cognitive:  Appropriate  Insight: Appropriate  Engagement in Group:  Engaged  Modes of Intervention:  Activity and Socialization  Elijio MilesMercer, Dynasti Kerman N 10/28/2014, 3:27 PM

## 2014-10-28 NOTE — Progress Notes (Signed)
D: Patient has slept until 1130.  He received his 0800 medications at that time.  Patient is in a pleasant mood; he is calm and cooperative.  Patient denies any SI/HI/AVH.  He continues to report depressive symptoms.  He rates his depression as an 8; hopelessness as a 6 and anxiety as a 5.  Patient's goal is to "stay on med, fix my relationships and stay positive.   A: Continue to monitor medication management and MD orders.  Safety checks continued every 15 minutes per protocol.  Meet 1:1 with patient to discuss concerns and offer encouragement. R: Patient's behavior is appropriate to situation.

## 2014-10-28 NOTE — Progress Notes (Signed)
[facility] MD Progress Note  10/28/2014 3:09 PM Edwin Martinez  MRN:  [id number]    Subjective:    Patient  is "feeling better today"  Participating in groups and optimistic for the future.  Plans to make changes in her relationship expecting respect and  Anxiety 6/10  Depression 8/10  Denies SIHI  Reports increase in Neurontin is helping and dose increased by Dr Dub Mikes further today    Recognizes God and prayer are his way of coping    Principal Problem: Bipolar I disorder, most recent episode depressed Diagnosis:   Patient Active Problem List   Diagnosis Date Noted  . Suicidal ideation [R45.851] 09/14/2014  . Depression [F32.9]   . Suicidal ideations [R45.851]   . Bipolar I disorder, most recent episode depressed [F31.30] 05/24/2014  . GAD (generalized anxiety disorder) [F41.1] 05/24/2014  . MDD (major depressive disorder) [F32.2] 05/23/2014  . Suicide attempt [T14.91] 05/23/2014  . MDD (major depressive disorder), recurrent episode, severe [F33.2] 05/23/2014  . Transaminitis [R74.0] 03/20/2014  . ADHD (attention deficit hyperactivity disorder) [F90.9] 09/12/2012  . INSOMNIA, CHRONIC [G47.00] 05/20/2007  . CARPAL TUNNEL SYNDROME [G56.00] 05/20/2007  . 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 HIV DISEASE [B20] 05/05/2007  . 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 HIV (human immunodeficiency virus infection)   . Bipolar 1 disorder   . Schizophrenia   . Asthma     Past Surgical History  Procedure Laterality Date  . Dental surgery     Family History:  Family History  Problem Relation Age of Onset  . Huntington's disease Father   . Heart disease Mother    Social History:  History  Alcohol Use  . 40.0 oz/week  . 80 Not specified per week    Comment: once week      History  Drug Use  . 2.00 per week  . Special:  Marijuana    Comment: every other day     History   Social History  . Marital Status: Single    Spouse Name: N/A    Number of Children: N/A  . Years of Education: N/A   Social History Main Topics  . Smoking status: Current Every Day Smoker -- 0.50 packs/day    Types: Cigarettes    Start date: 09/29/1991  . Smokeless tobacco: Never Used  . Alcohol Use: 40.0 oz/week    80 Not specified per week     Comment: once week   . Drug Use: 2.00 per week    Special: Marijuana     Comment: every other day   . Sexual Activity: Yes    Birth Control/ Protection: Condom   Other Topics Concern  . None   Social History Narrative   Additional History:    Sleep: Fair  Appetite:  Fair   Assessment:   Musculoskeletal: Strength & Muscle Tone: within normal limits Gait & Station: normal Patient leans: N/A   Psychiatric Specialty Exam: Physical Exam  Constitutional: He is oriented to person, place, and time. He appears well-developed and well-nourished.  HENT:  Head: Normocephalic and atraumatic.  Neck: Normal range of motion. Neck supple.  Musculoskeletal: Normal range of motion.  Neurological: He is alert and oriented to person, place, and time.  Skin: Skin is warm and dry.    Review of Systems  Constitutional: Negative.   HENT: Negative.  Eyes: Negative.   Cardiovascular: Negative.   Skin: Negative.   Neurological: Negative.     Blood pressure 124/74, pulse 81, temperature 97.6 F (36.4 C), temperature source Oral, resp. rate 16, height  (1.753 m), weight 74.39 kg (164 lb), SpO2 98 %.Body mass index is 24.21 kg/(m^2).  General Appearance:casual  Eye Contact::  Fair  Speech:  Clear and Coherent and Pressured  Volume:  Increased  Mood:  Anxious, Depressed and worried  Affect:  Labile  Thought Process:  Coherent and Goal Directed  Orientation:  Full (Time, Place, and Person)  Thought Content:  symtpoms events worries concerns  Suicidal Thoughts:  Intermittent   Homicidal Thoughts:  No  Memory:  Immediate;   Fair Recent;   Fair Remote;   Fair  Judgement:  Fair  Insight:  Present  Psychomotor Activity:  Hyper active  Concentration:  Fair  Recall:  [employer] of Knowledge:Fair  Language: Fair  Akathisia:  No  Handed:  Right  AIMS (if indicated):     Assets:  Desire for Improvement  ADL's:  Intact  Cognition: WNL  Sleep:  Number of Hours: 5.5     Current Medications: Current Facility-Administered Medications  Medication Dose Route Frequency Provider Last Rate Last Dose  . abacavir (ZIAGEN) tablet 600 mg  600 mg Oral Daily Rockey Situ Cobos, MD   600 mg at 10/28/14 1008   And  . lamiVUDine (EPIVIR) tablet 300 mg  300 mg Oral Daily Rockey Situ Cobos, MD   300 mg at 10/28/14 1008   And  . dolutegravir (TIVICAY) tablet 50 mg  50 mg Oral Daily Craige Cotta, MD   50 mg at 10/28/14 1009  . acetaminophen (TYLENOL) tablet 650 mg  650 mg Oral Q6H PRN Fransisca Kaufmann, NP      . albuterol (PROVENTIL HFA;VENTOLIN HFA) 108 (90 BASE) MCG/ACT inhaler 2 puff  2 puff Inhalation Q6H PRN Rachael Fee, MD   2 puff at 10/28/14 0228  . divalproex (DEPAKOTE ER) 24 hr tablet 500 mg  500 mg Oral BID Rachael Fee, MD   500 mg at 10/28/14 1009  . gabapentin (NEURONTIN) capsule 300 mg  300 mg Oral TID Rachael Fee, MD      . guaiFENesin [facility]) 12 hr tablet 600 mg  600 mg Oral BID PRN Fransisca Kaufmann, NP      . traZODone (DESYREL) tablet 100 mg  100 mg Oral QHS PRN Rachael Fee, MD   100 mg at 10/27/14 2144    Lab Results:  No results found for this or any previous visit (from the past 48 hour(s)).  Physical Findings: AIMS: Facial and Oral Movements Muscles of Facial Expression: None, normal Lips and Perioral Area: None, normal Jaw: None, normal Tongue: None, normal,Extremity Movements Upper (arms, wrists, hands, fingers): None, normal Lower (legs, knees, ankles, toes): None, normal, Trunk Movements Neck, shoulders, hips: None, normal, Overall  Severity Severity of abnormal movements (highest score from questions above): None, normal Incapacitation due to abnormal movements: None, normal Patient's awareness of abnormal movements (rate only patient's report): No Awareness, Dental Status Current problems with teeth and/or dentures?: No Does patient usually wear dentures?: No  CIWA:  CIWA-Ar Total: 0 COWS:     Treatment Plan Summary: Daily contact with patient to assess and evaluate symptoms and progress in treatment and Medication management Supportive approach/coping skills Bipolar Disorder continue Depakote and get a Depakote level Anxiety, agitation: Increase the Neurontin to 300 mg TID Will  work with CBT;mindfulness to  cope better with what is going on Discussed prayer and spirituality as coping stategies Medical Decision Making:  Review of Psycho-Social Stressors (1), Review or order clinical lab tests (1), Review of Medication Regimen & Side Effects (2) and Review of New Medication or Change in Dosage (2)     LARACH, MARY  PMHNP 10/28/2014, 3:09 PM I personally evaluated the patient. He did tolerate the Neurontin at 200 mg TID, will go ahead and increased it to 300 mg TID. Still no word from his partner. Getting frustrated as he would like to know one way or the other if they are going to be together or not Madie RenoIrving A. Dub MikesLugo, M.D.

## 2014-10-28 NOTE — Progress Notes (Signed)
Patient did not attend the evening speaker AA meeting. Pt reported wanting to take a shower instead and hung out in hall.

## 2014-10-29 MED ORDER — TRAZODONE HCL 100 MG PO TABS: 100.0000 mg | ORAL_TABLET | Freq: Every evening | ORAL | Status: DC | PRN

## 2014-10-29 MED ORDER — GABAPENTIN 300 MG PO CAPS: 300.0000 mg | ORAL_CAPSULE | Freq: Three times a day (TID) | ORAL | Status: DC

## 2014-10-29 MED ORDER — ABACAVIR-DOLUTEGRAVIR-LAMIVUD 600-50-300 MG PO TABS: 1.0000 | ORAL_TABLET | Freq: Every day | ORAL | Status: DC

## 2014-10-29 MED ORDER — ALBUTEROL SULFATE HFA 108 (90 BASE) MCG/ACT IN AERS: 2.0000 | INHALATION_SPRAY | Freq: Four times a day (QID) | RESPIRATORY_TRACT | Status: DC | PRN

## 2014-10-29 MED ORDER — DIVALPROEX SODIUM ER 500 MG PO TB24: 500.0000 mg | ORAL_TABLET | Freq: Two times a day (BID) | ORAL | Status: DC

## 2014-10-29 NOTE — Progress Notes (Signed)
D:  Patient denied SI and HI, contracts for safety.  Denied A/V hallucinations.  Denied pain. A:  Medications administered per MD orders.  Emotional support and encouragement given patient. R:  Safety maintained with 15 minute checks.  

## 2014-10-29 NOTE — [facility] Group Notes (Signed)
[facility]   10/29/2014 12:22 PM    Participation Quality:  Appropraite  Mood/Affect:  Appropriate  Depression Rating:  0  Anxiety Rating:  0  Thoughts of Suicide:  No  Will you contract for safety?   NA  Current AVH:  No  Plan for Discharge/Comments:  Patient attended discharge planning group and actively participated in group. He reports feeling much better.  Patient advised of plans to say [place] and asked to [facility].  Suicide prevention education reviewed and SPE document provided.   Transportation Means: Patient has transportation.   Supports:  Patient has a support system.   Langley Ingalls, Joesph JulyQuylle Hairston

## 2014-10-29 NOTE — Progress Notes (Signed)
Patient became very upset because another patient was walked out to be discharged.  This patient had his belongings ready to go.  Patient was told that his discharge information is not complete.  Patient became upset and stated, "this  nurse has not done her job that is why I can't be discharged".  [facility] informed and came to speak with patient concerning his issues.  Nurse talked with Dr. Dub MikesLugo, [facility], charge nurse, NP, about discharge information and attitude of patient.  AC has talked with patient several times.  Dr. Jama Flavorsobos also informed of patient's attitude, abruptness  and statements made toward the nurse.  AC reviewed all discharge instructions with patient.  Patient was given all his belongings and escorted out.

## 2014-10-29 NOTE — Discharge Summary (Signed)
Physician Discharge Summary Note  Patient:  Edwin Martinez is an 29 y.o., male MRN:  [id number] DOB:  May 14, 1986 Patient phone:  [phone] (home)  Patient address:   [address] [place] [address],  Total Time spent with patient: Greater than 30 minutes  Date of Admission:  10/26/2014  Date of Discharge: 10/29/14  Reason for Admission:  Suicide attempt by overdose  Principal Problem: Bipolar I disorder, most recent episode depressed Discharge Diagnoses: Patient Active Problem List   Diagnosis Date Noted  . Suicidal ideation [R45.851] 09/14/2014  . Depression [F32.9]   . Suicidal ideations [R45.851]   . Bipolar I disorder, most recent episode depressed [F31.30] 05/24/2014  . GAD (generalized anxiety disorder) [F41.1] 05/24/2014  . MDD (major depressive disorder) [F32.2] 05/23/2014  . Suicide attempt [T14.91] 05/23/2014  . MDD (major depressive disorder), recurrent episode, severe [F33.2] 05/23/2014  . Transaminitis [R74.0] 03/20/2014  . ADHD (attention deficit hyperactivity disorder) [F90.9] 09/12/2012  . INSOMNIA, CHRONIC [G47.00] 05/20/2007  . CARPAL TUNNEL SYNDROME [G56.00] 05/20/2007  . 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 HIV DISEASE [B20] 05/05/2007  . 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 Judgment and thought content normal. His mood appears not anxious. His affect is not angry, not blunt, not labile and not inappropriate. Cognition and memory are normal. He does not exhibit a depressed mood.    Review of Systems  Constitutional: Negative.   HENT: Negative.   Eyes: Negative.   Respiratory: Negative.   Cardiovascular: Negative.   Gastrointestinal: Negative.   Genitourinary: Negative.    Musculoskeletal: Negative.   Skin: Negative.   Neurological: Negative.   Endo/Heme/Allergies: Negative.   Psychiatric/Behavioral: Positive for depression (Stable). Negative for suicidal ideas, hallucinations, memory loss and substance abuse. The patient has insomnia (Stable). The patient is not nervous/anxious.     Blood pressure 135/77, pulse 67, temperature 97.7 F (36.5 C), temperature source Oral, resp. rate 16, height  (1.753 m), weight 74.39 kg (164 lb), SpO2 98 %.Body mass index is 24.21 kg/(m^2).  See Md's SRA   Past Medical History:  Past Medical History  Diagnosis Date  . HIV (human immunodeficiency virus infection)   . Bipolar 1 disorder   . Schizophrenia   . Asthma     Past Surgical History  Procedure Laterality Date  . Dental surgery     Family History:  Family History  Problem Relation Age of Onset  . Huntington's disease Father   . Heart disease Mother    Social History:  History  Alcohol Use  . 40.0 oz/week  . 80 Not specified per week    Comment: once week      History  Drug Use  . 2.00 per week  . Special: Marijuana    Comment: every other day     History   Social History  . Marital Status: Single    Spouse Name: N/A    Number of Children: N/A  . Years of Education: N/A   Social History Main Topics  . Smoking status: Current Every Day Smoker -- 0.50 packs/day    Types: Cigarettes    Start date: 09/29/1991  . Smokeless tobacco: Never Used  . Alcohol Use: 40.0 oz/week    80 Not specified per week     Comment: once  week   . Drug Use: 2.00 per week    Special: Marijuana     Comment: every other day   . Sexual Activity: Yes    Birth Control/ Protection: Condom   Other Topics Concern  . None   Social History Narrative    Risk to Self: Is patient at risk for suicide?: No What has been your use of drugs/alcohol within the last 12 months?: Patient reports ocassional alcohol/THC use but denies regular use of alcohol/drugs Risk to  Others:   Prior Inpatient Therapy:   Prior Outpatient Therapy:    Level of Care:  OP  Hospital Course:  Edwin Martinez is a 29 y.o. male who voluntarily presents to [facility] with suicidal thoughts and depressive symptoms. He reports being diagnosed with Bipolar Disorder since 2001. Patient reports feeling more depressed for the past few days with symptoms such as insomnia, crying spells, and loss of energy. The patient reports the symptoms were acutely worsened upon finding out last night that his brother has been recently diagnosed with Huntington's Disease. Patient states "I got really anxious. I thought how it will all be on me to deal with his bad health. I have already seen three Aunts and my father die from that disease. I took the pill but then felt guilty.   While a patient in this hospital, Sharron received medication management for mood stabilization treatment. He was medicated and discharged on; Depakote ER 500 mg bid for mood stabilization, Gabapentin 300 mg three times daily for agitation and Trazodone 100 mg Q bedtime for sleep. He was enrolled & participated in the group counseling sessions to learn coping skills that should help him cope better & manage his symptoms.   Stellan's symptoms responded well to his treatment regimen. This is evidenced by his reports of improved mood & absence of substance withdrawal symptoms. He is currently being discharged to continue psychiatric treatment & medication management at the [facility] here in [place], [place]. He is provided with all the necessary information required to contact & make this appointment without problems. Upon discharge, he adamantly denies any SIHI, AVH, delusional thoughts and or paranoia. He received from the [facility] pharmacy, a 14 days worth, supply samples of his [facility] discharge medications. He left [facility] with all belongings in no distress. Transportation per patient arrangement.  Consults:  psychiatry  Significant Diagnostic  Studies:  labs:  with diff, CMP, UDS, toxicology tests, U/A, reports reviewed, no changes  Discharge Vitals:   Blood pressure 135/77, pulse 67, temperature 97.7 F (36.5 C), temperature source Oral, resp. rate 16, height  (1.753 m), weight 74.39 kg (164 lb), SpO2 98 %. Body mass index is 24.21 kg/(m^2). Lab Results:   No results found for this or any previous visit (from the past 72 hour(s)).  Physical Findings: AIMS: Facial and Oral Movements Muscles of Facial Expression: None, normal Lips and Perioral Area: None, normal Jaw: None, normal Tongue: None, normal,Extremity Movements Upper (arms, wrists, hands, fingers): None, normal Lower (legs, knees, ankles, toes): None, normal, Trunk Movements Neck, shoulders, hips: None, normal, Overall Severity Severity of abnormal movements (highest score from questions above): None, normal Incapacitation due to abnormal movements: None, normal Patient's awareness of abnormal movements (rate only patient's report): No Awareness, Dental Status Current problems with teeth and/or dentures?: No Does patient usually wear dentures?: No  CIWA:  CIWA-Ar Total: 1 COWS:  COWS Total Score: 1   See Psychiatric Specialty Exam and Suicide Risk Assessment completed by Attending Physician  prior to discharge.  Discharge destination:  Home  Is patient on multiple antipsychotic therapies at discharge:  No   Has Patient had three or more failed trials of antipsychotic monotherapy by history:  No  Recommended Plan for Multiple Antipsychotic Therapies: NA    Medication List    STOP taking these medications        busPIRone 10 MG tablet  Commonly known as:  BUSPAR     divalproex 500 MG DR tablet  Commonly known as:  DEPAKOTE  Replaced by:  divalproex 500 MG 24 hr tablet      TAKE these medications      Indication   Abacavir-Dolutegravir-Lamivud 600-50-300 MG Tabs  Commonly known as:  TRIUMEQ  Take 1 tablet by mouth daily. For HIV infection    Indication:  HIV Disease     albuterol 108 (90 BASE) MCG/ACT inhaler  Commonly known as:  PROVENTIL HFA;VENTOLIN HFA  Inhale 2 puffs into the lungs every 6 (six) hours as needed for wheezing or shortness of breath.   Indication:  Asthma, Chronic Obstructive Lung Disease     divalproex 500 MG 24 hr tablet  Commonly known as:  DEPAKOTE ER  Take 1 tablet (500 mg total) by mouth 2 (two) times daily. For mood stabilization   Indication:  Mood stabilization     gabapentin 300 MG capsule  Commonly known as:  NEURONTIN  Take 1 capsule (300 mg total) by mouth 3 (three) times daily. For agitation   Indication:  Agitation     traZODone 100 MG tablet  Commonly known as:  DESYREL  Take 1 tablet (100 mg total) by mouth at bedtime as needed for sleep.   Indication:  Trouble Sleeping           Follow-up Information    Follow up with [name] On 10/30/2014.   Why:  Please go to [name]'s walk in clinic on Tuesday, October 30, 2014 or any weekday for medication management/counseling   Contact information:   [address] [place], [place]   [address]  [phone]     Follow-up recommendations: Activity:  As tolerated Diet: As recommended by your primary care doctor. Keep all scheduled follow-up appointments as recommended.    Comments: Take all your medications as prescribed by your mental healthcare provider. Report any adverse effects and or reactions from your medicines to your outpatient provider promptly. Patient is instructed and cautioned to not engage in alcohol and or illegal drug use while on prescription medicines. In the event of worsening symptoms, patient is instructed to call the crisis hotline, 911 and or go to the nearest ED for appropriate evaluation and treatment of symptoms. Follow-up with your primary care provider for your other medical issues, concerns and or health care needs.   Total Discharge Time: Greater than 30 minutes  Signed: Sanjuana Kavawoko, Agnes I,  PMHNP-Bc 10/29/2014, 2:50 PM  I personally assessed the patient and formulated the plan Madie RenoIrving A. Dub MikesLugo, M.D.

## 2014-10-29 NOTE — Progress Notes (Signed)
Patient ID: Edwin Martinez, male   DOB: May 22, 1986, 29 y.o.   MRN: [id number]  Patient discharged to lobby with no distress. Q15 minute safety checks maintained until discharge.

## 2014-10-29 NOTE — Progress Notes (Signed)
  [facility] :  Will you be returning to the same living situation after discharge:  No.  Patient [place]. At discharge, do you have transportation home?: Yes,  Patient able to arrange transporation. Do you have the ability to pay for your medications: No.  Patient needs assistance with indigent medications   Release of information consent forms completed and in the chart;  Patient's signature needed at discharge.  Patient to Follow up at: Follow-up Information    Follow up with [name] On 10/30/2014.   Why:  Please go to [name]'s walk in clinic on Tuesday, October 30, 2014 or any weekday for medication management/counseling   Contact information:   [address] [place], [place]   [address]  [phone]      Patient denies SI/HI: Patient no longer endorsing SI/HI or other thoughts of self harm.   Safety Planning and Suicide Prevention discussed: .Reviewed with all patients during discharge planning group  Has patient been referred to the Quitline?: Patient declined referral to Quitline.   Wynn BankerHodnett, Karron Alvizo Hairston 10/29/2014, 12:25 PM

## 2014-10-29 NOTE — [facility] Suicide Risk Assessment (Signed)
[facility]   Demographic Factors:  Gay, lesbian, or bisexual orientation  Total Time spent with patient: 30 minutes  Musculoskeletal: Strength & Muscle Tone: within normal limits Gait & Station: normal Patient leans: N/A  Psychiatric Specialty Exam: Physical Exam  Review of Systems  Constitutional: Negative.   HENT: Negative.   Eyes: Negative.   Respiratory: Negative.   Cardiovascular: Negative.   Gastrointestinal: Negative.   Genitourinary: Negative.   Musculoskeletal: Negative.   Skin: Negative.   Neurological: Negative.   Endo/Heme/Allergies: Negative.   Psychiatric/Behavioral: Positive for depression. The patient is nervous/anxious.     Blood pressure 135/77, pulse 67, temperature 97.7 F (36.5 C), temperature source Oral, resp. rate 16, height 5\' 9"  (1.753 m), weight 74.39 kg (164 lb), SpO2 98 %.Body mass index is 24.21 kg/(m^2).  General Appearance: Fairly Groomed  Patent attorneyye Contact::  Fair  Speech:  Clear and Coherent409  Volume:  Normal  Mood:  Anxious  Affect:  Appropriate  Thought Process:  Coherent and Goal Directed  Orientation:  Full (Time, Place, and Person)  Thought Content:  plans as he moves on  Suicidal Thoughts:  No  Homicidal Thoughts:  No  Memory:  Immediate;   Fair Recent;   Fair Remote;   Fair  Judgement:  Fair  Insight:  Present  Psychomotor Activity:  Restlessness  Concentration:  Fair  Recall:  FiservFair  Fund of Knowledge:Fair  Language: Fair  Akathisia:  No  Handed:  Right  AIMS (if indicated):     Assets:  Desire for Improvement Housing Social Support  Sleep:  Number of Hours: 6  Cognition: WNL  ADL's:  Intact   Have you used any form of tobacco in the last 30 days? (Cigarettes, Smokeless Tobacco, Cigars, and/or Pipes): Yes  Has this patient used any form of tobacco in the last 30 days? (Cigarettes, Smokeless Tobacco, Cigars, and/or Pipes) Yes, A prescription for an FDA-approved tobacco cessation medication  was offered at discharge and the patient refused  Mental Status Per Nursing Assessment::   On Admission:  Self-harm behaviors  Current Mental Status by Physician: In full contact with reality. States he is ready to go home. His mood is "better" his affect is full range. He is planning to meet with his BF as soon as possible to clarify the status of the relationship. If they were to split, he is planning to stay with his mother temporarily. States he will accept his decision, says he just wants to know so he can move on   Loss Factors: Loss of significant relationship  Historical Factors: NA  Risk Reduction Factors:   Sense of responsibility to family and Positive social support  Continued Clinical Symptoms:  Severe Anxiety and/or Agitation Bipolar Disorder:   Depressive phase  Cognitive Features That Contribute To Risk:  Closed-mindedness, Polarized thinking and Thought constriction (tunnel vision)    Suicide Risk:  Minimal: No identifiable suicidal ideation.  Patients presenting with no risk factors but with morbid ruminations; may be classified as minimal risk based on the severity of the depressive symptoms  Principal Problem: Bipolar I disorder, most recent episode depressed Discharge Diagnoses:  Patient Active Problem List   Diagnosis Date Noted  . Bipolar I disorder, most recent episode depressed [F31.30] 05/24/2014    Priority: High  . GAD (generalized anxiety disorder) [F41.1] 05/24/2014    Priority: High  . MDD (major depressive disorder), recurrent episode, severe [F33.2] 05/23/2014    Priority: High  . Suicidal ideation [R45.851] 09/14/2014  .  Depression [F32.9]   . Suicidal ideations [R45.851]   . MDD (major depressive disorder) [F32.2] 05/23/2014  . Suicide attempt [T14.91] 05/23/2014  . Transaminitis [R74.0] 03/20/2014  . ADHD (attention deficit hyperactivity disorder) [F90.9] 09/12/2012  . INSOMNIA, CHRONIC [G47.00] 05/20/2007  . CARPAL TUNNEL SYNDROME  [G56.00] 05/20/2007  . 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 HIV DISEASE [B20] 05/05/2007  . 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   Why:  Please go to [name]'s walk in clinic on Tuesday, October 30, 2014 or any weekday for medication management/counseling   Contact information:   [address] [place], [place]   [address]  [phone]      Plan Of Care/Follow-up recommendations:  Activity:  as tolerated Diet:  regular Follow up [name] as above Is patient on multiple antipsychotic therapies at discharge:  No   Has Patient had three or more failed trials of antipsychotic monotherapy by history:  No  Recommended Plan for Multiple Antipsychotic Therapies: NA    Lizanne Erker A 10/29/2014, 1:19 PM

## 2014-11-02 NOTE — Progress Notes (Signed)
Patient Discharge Instructions:  After Visit Summary (AVS):   Faxed to:  11/02/14 Discharge Summary Note:   Faxed to:  11/02/14 Psychiatric Admission Assessment Note:   Faxed to:  11/02/14 Suicide Risk Assessment - Discharge Assessment:   Faxed to:  11/02/14 Faxed/Sent to the Next Level Care provider:  11/02/14 [facility] [phone]  Jerelene ReddenSheena E La Conner, 11/02/2014, 3:09 PM

## 2014-11-06 ENCOUNTER — Ambulatory Visit (INDEPENDENT_AMBULATORY_CARE_PROVIDER_SITE_OTHER): Payer: Self-pay | Admitting: Internal Medicine

## 2014-11-06 ENCOUNTER — Encounter: Payer: Self-pay | Admitting: Internal Medicine

## 2014-11-06 VITALS — BP 128/77 | HR 64 | Temp 98.0°F | Wt 167.0 lb

## 2014-11-06 DIAGNOSIS — B20 Human immunodeficiency virus [HIV] disease: Secondary | ICD-10-CM

## 2014-11-06 DIAGNOSIS — Z113 Encounter for screening for infections with a predominantly sexual mode of transmission: Secondary | ICD-10-CM

## 2014-11-06 DIAGNOSIS — Z21 Asymptomatic human immunodeficiency virus [HIV] infection status: Secondary | ICD-10-CM

## 2014-11-06 LAB — CBC WITH DIFFERENTIAL/PLATELET
Basophils Absolute: 0.1 10*3/uL (ref 0.0–0.1)
Basophils Relative: 1 % (ref 0–1)
EOS ABS: 0.2 10*3/uL (ref 0.0–0.7)
Eosinophils Relative: 2 % (ref 0–5)
HEMATOCRIT: 44.7 % (ref 39.0–52.0)
HEMOGLOBIN: 14.7 g/dL (ref 13.0–17.0)
LYMPHS ABS: 3 10*3/uL (ref 0.7–4.0)
Lymphocytes Relative: 31 % (ref 12–46)
MCH: 28.9 pg (ref 26.0–34.0)
MCHC: 32.9 g/dL (ref 30.0–36.0)
MCV: 88 fL (ref 78.0–100.0)
MONO ABS: 0.7 10*3/uL (ref 0.1–1.0)
MPV: 9.6 fL (ref 8.6–12.4)
Monocytes Relative: 7 % (ref 3–12)
Neutro Abs: 5.7 10*3/uL (ref 1.7–7.7)
Neutrophils Relative %: 59 % (ref 43–77)
Platelets: 301 10*3/uL (ref 150–400)
RBC: 5.08 MIL/uL (ref 4.22–5.81)
RDW: 14.9 % (ref 11.5–15.5)
WBC: 9.7 10*3/uL (ref 4.0–10.5)

## 2014-11-06 NOTE — Progress Notes (Signed)
Patient ID: Edwin Martinez, male   DOB: November 04, 1985, 29 y.o.   MRN: [id number]       Patient ID: Edwin Martinez, male   DOB: November 04, 1985, 29 y.o.   MRN: [id number]  HPI Edwin Martinez is a 29yo M with HIV disease and MDD with occasional SI. He is on triumeq, currently well controlled on hiv meds, CD 4 count of 1130/VL<20. He was in the ED on 2/1 for assessment of suicide ideation. He states that he was feeling "over the top" when he heard that his sibling was diagnosed with huntington's disease. He worries this would happen to him. He feels his mood is more balanced. No SI/SA.  Outpatient Encounter Prescriptions as of 11/06/2014  Medication Sig  . Abacavir-Dolutegravir-Lamivud (TRIUMEQ) 600-50-300 MG TABS Take 1 tablet by mouth daily. For HIV infection  . albuterol (PROVENTIL HFA;VENTOLIN HFA) 108 (90 BASE) MCG/ACT inhaler Inhale 2 puffs into the lungs every 6 (six) hours as needed for wheezing or shortness of breath.  . divalproex (DEPAKOTE ER) 500 MG 24 hr tablet Take 1 tablet (500 mg total) by mouth 2 (two) times daily. For mood stabilization  . gabapentin (NEURONTIN) 300 MG capsule Take 1 capsule (300 mg total) by mouth 3 (three) times daily. For agitation  . traZODone (DESYREL) 100 MG tablet Take 1 tablet (100 mg total) by mouth at bedtime as needed for sleep.     Patient Active Problem List   Diagnosis Date Noted  . Suicidal ideation 09/14/2014  . Depression   . Suicidal ideations   . Bipolar I disorder, most recent episode depressed 05/24/2014  . GAD (generalized anxiety disorder) 05/24/2014  . MDD (major depressive disorder) 05/23/2014  . Suicide attempt 05/23/2014  . MDD (major depressive disorder), recurrent episode, severe 05/23/2014  . Transaminitis 03/20/2014  . ADHD (attention deficit hyperactivity disorder) 09/12/2012  . INSOMNIA, CHRONIC 05/20/2007  . CARPAL TUNNEL SYNDROME 05/20/2007  . 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 HIV DISEASE 05/05/2007  . BPLR I,  MIXED, MOST RECENT EPSD, MODERATE 05/05/2007     Health Maintenance Due  Topic Date Due  . TETANUS/TDAP  08/08/2004     Review of Systems +anxious, otherwise 12 point ros is negative Physical Exam   BP 128/77 mmHg  Pulse 64  Temp(Src) 98 F (36.7 C) (Oral)  Wt 167 lb (75.751 kg) Physical Exam  Constitutional: He is oriented to person, place, and time. He appears well-developed and well-nourished. No distress.  HENT:  Mouth/Throat: Oropharynx is clear and moist. No oropharyngeal exudate.  Cardiovascular: Normal rate, regular rhythm and normal heart sounds. Exam reveals no gallop and no friction rub.  No murmur heard.  Pulmonary/Chest: Effort normal and breath sounds normal. No respiratory distress. He has no wheezes.  Abdominal: Soft. Bowel sounds are normal. He exhibits no distension. There is no tenderness.  Lymphadenopathy:  He has no cervical adenopathy.  Neurological: He is alert and oriented to person, place, and time.  Skin: Skin is warm and dry. No rash noted. No erythema.  Psychiatric: He has a normal mood and affect. His behavior is normal.    Lab Results  Component Value Date   CD4TCELL 43 07/10/2014   Lab Results  Component Value Date   CD4TABS 1130 07/10/2014   CD4TABS 1160 05/09/2014   CD4TABS 1150 02/21/2014   Lab Results  Component Value Date   HIV1RNAQUANT <20 07/10/2014   Lab Results  Component Value Date   HEPBSAB REACTIVE* 09/06/2012   No results found for:  RPR  CBC Lab Results  Component Value Date   WBC 7.6 10/26/2014   RBC 4.24 10/26/2014   HGB 12.3* 10/26/2014   HCT 37.2* 10/26/2014   PLT 242 10/26/2014   MCV 87.7 10/26/2014   MCH 29.0 10/26/2014   MCHC 33.1 10/26/2014   RDW 14.5 10/26/2014   LYMPHSABS 4.2* 10/26/2014   MONOABS 0.5 10/26/2014   EOSABS 0.2 10/26/2014   BASOSABS 0.0 10/26/2014   BMET Lab Results  Component Value Date   NA 140 10/26/2014   K 3.4* 10/26/2014   CL 111 10/26/2014   CO2 23 10/26/2014    GLUCOSE 87 10/26/2014   BUN 9 10/26/2014   CREATININE 0.78 10/26/2014   CALCIUM 8.5 10/26/2014   GFRNONAA >90 10/26/2014   GFRAA >90 10/26/2014   Family hx : huntington's  Assessment and Plan  hiv = continue with stribild, will get labs  ? Neuro eval for family hx of huntingtons = will need to look into what would be needed, if family members need to be tested. He reports that his brother has no symptoms at this time.   Anxiety/MDD = recommend to continue with his current regimen and continue with counseling.  Duke Salvia [name] MD MPH [facility] [phone]

## 2014-11-07 LAB — COMPLETE METABOLIC PANEL WITH GFR
ALBUMIN: 4.3 g/dL (ref 3.5–5.2)
ALK PHOS: 61 U/L (ref 39–117)
ALT: 20 U/L (ref 0–53)
AST: 36 U/L (ref 0–37)
BUN: 12 mg/dL (ref 6–23)
CHLORIDE: 105 meq/L (ref 96–112)
CO2: 20 meq/L (ref 19–32)
Calcium: 9.2 mg/dL (ref 8.4–10.5)
Creat: 0.87 mg/dL (ref 0.50–1.35)
GFR, Est Non African American: >89 mL/min
GLUCOSE: 84 mg/dL (ref 70–99)
Potassium: 4.2 mEq/L (ref 3.5–5.3)
SODIUM: 138 meq/L (ref 135–145)
TOTAL PROTEIN: 7.2 g/dL (ref 6.0–8.3)
Total Bilirubin: 0.4 mg/dL (ref 0.2–1.2)

## 2014-11-07 LAB — T-HELPER CELL (CD4) - (RCID CLINIC ONLY)
CD4 T CELL HELPER: 34 % (ref 33–55)
CD4 T Cell Abs: 1000 /uL (ref 400–2700)

## 2014-11-07 LAB — HIV-1 RNA QUANT-NO REFLEX-BLD
HIV 1 RNA Quant: <20 copies/mL (ref <20)
HIV-1 RNA Quant, Log: <1.3 {Log} (ref <1.30)

## 2014-11-07 LAB — RPR

## 2014-11-19 ENCOUNTER — Encounter: Payer: Self-pay | Admitting: *Deleted

## 2014-11-19 ENCOUNTER — Other Ambulatory Visit: Payer: Self-pay | Admitting: *Deleted

## 2014-11-19 DIAGNOSIS — B20 Human immunodeficiency virus [HIV] disease: Secondary | ICD-10-CM

## 2014-11-19 MED ORDER — ABACAVIR-DOLUTEGRAVIR-LAMIVUD 600-50-300 MG PO TABS: 1.0000 | ORAL_TABLET | Freq: Every day | ORAL | Status: DC

## 2014-11-19 NOTE — Telephone Encounter (Signed)
ADAP Application 

## 2014-11-21 NOTE — Telephone Encounter (Signed)
error 

## 2014-12-22 ENCOUNTER — Emergency Department (HOSPITAL_COMMUNITY)
Admission: EM | Admit: 2014-12-22 | Discharge: 2014-12-22 | Disposition: A | Discharge status: Home / Self Care | Payer: Self-pay | Attending: Emergency Medicine | Admitting: Emergency Medicine

## 2014-12-22 ENCOUNTER — Encounter (HOSPITAL_COMMUNITY): Payer: Self-pay | Admitting: Emergency Medicine

## 2014-12-22 ENCOUNTER — Emergency Department (HOSPITAL_COMMUNITY): Payer: Self-pay

## 2014-12-22 DIAGNOSIS — Z720 Tobacco use: Secondary | ICD-10-CM | POA: Insufficient documentation

## 2014-12-22 DIAGNOSIS — J40 Bronchitis, not specified as acute or chronic: Secondary | ICD-10-CM

## 2014-12-22 DIAGNOSIS — F319 Bipolar disorder, unspecified: Secondary | ICD-10-CM | POA: Insufficient documentation

## 2014-12-22 DIAGNOSIS — Z880 Allergy status to penicillin: Secondary | ICD-10-CM | POA: Insufficient documentation

## 2014-12-22 DIAGNOSIS — Z79899 Other long term (current) drug therapy: Secondary | ICD-10-CM | POA: Insufficient documentation

## 2014-12-22 DIAGNOSIS — Z21 Asymptomatic human immunodeficiency virus [HIV] infection status: Secondary | ICD-10-CM | POA: Insufficient documentation

## 2014-12-22 DIAGNOSIS — I10 Essential (primary) hypertension: Secondary | ICD-10-CM | POA: Insufficient documentation

## 2014-12-22 DIAGNOSIS — J45901 Unspecified asthma with (acute) exacerbation: Secondary | ICD-10-CM | POA: Insufficient documentation

## 2014-12-22 HISTORY — DX: Essential (primary) hypertension: I10

## 2014-12-22 MED ORDER — HYDROCOD POLST-CHLORPHEN POLST 10-8 MG/5ML PO LQCR: 5.0000 mL | Freq: Two times a day (BID) | ORAL | Status: DC | PRN

## 2014-12-22 MED ORDER — ALBUTEROL SULFATE (2.5 MG/3ML) 0.083% IN NEBU
2.5000 mg | INHALATION_SOLUTION | Freq: Once | RESPIRATORY_TRACT | Status: DC
  Filled 2014-12-22: qty 3

## 2014-12-22 MED ORDER — LORAZEPAM 1 MG PO TABS
1.0000 mg | ORAL_TABLET | Freq: Once | ORAL | Status: AC
  Administered 2014-12-22: 1 mg via ORAL
  Filled 2014-12-22: qty 1

## 2014-12-22 MED ORDER — ALBUTEROL SULFATE (2.5 MG/3ML) 0.083% IN NEBU
2.5000 mg | INHALATION_SOLUTION | Freq: Once | RESPIRATORY_TRACT | Status: AC
  Administered 2014-12-22: 2.5 mg via RESPIRATORY_TRACT

## 2014-12-22 MED ORDER — AZITHROMYCIN 250 MG PO TABS: ORAL_TABLET | ORAL | Status: DC

## 2014-12-22 MED ORDER — PREDNISONE 50 MG PO TABS
60.0000 mg | ORAL_TABLET | Freq: Once | ORAL | Status: AC
  Administered 2014-12-22: 60 mg via ORAL
  Filled 2014-12-22 (×2): qty 1

## 2014-12-22 MED ORDER — ALBUTEROL SULFATE HFA 108 (90 BASE) MCG/ACT IN AERS
2.0000 | INHALATION_SPRAY | RESPIRATORY_TRACT | Status: DC | PRN
  Administered 2014-12-22: 2 via RESPIRATORY_TRACT
  Filled 2014-12-22: qty 6.7

## 2014-12-22 MED ORDER — IPRATROPIUM-ALBUTEROL 0.5-2.5 (3) MG/3ML IN SOLN
RESPIRATORY_TRACT | Status: AC
  Filled 2014-12-22: qty 3

## 2014-12-22 MED ORDER — IPRATROPIUM-ALBUTEROL 0.5-2.5 (3) MG/3ML IN SOLN: 3.0000 mL | Freq: Once | RESPIRATORY_TRACT | Status: DC

## 2014-12-22 MED ORDER — IPRATROPIUM-ALBUTEROL 0.5-2.5 (3) MG/3ML IN SOLN
3.0000 mL | Freq: Once | RESPIRATORY_TRACT | Status: AC
  Administered 2014-12-22: 3 mL via RESPIRATORY_TRACT

## 2014-12-22 NOTE — ED Notes (Signed)
Patient transported back from X-ray 

## 2014-12-22 NOTE — ED Notes (Signed)
Pt reports SOB that began around 1200 when he woke up. Pt has hx of asthma.

## 2014-12-22 NOTE — ED Notes (Signed)
Patient with no complaints at this time. Respirations even and unlabored. Skin warm/dry. Discharge instructions reviewed with patient at this time. Patient given opportunity to voice concerns/ask questions. IV removed per policy and band-aid applied to site. Patient discharged at this time and left Emergency Department with steady gait.  

## 2014-12-22 NOTE — ED Provider Notes (Signed)
CSN: [id number]     Arrival date & time 12/22/14  1533 History   First MD Initiated Contact with Patient 12/22/14 1550     Chief Complaint  Patient presents with  . Shortness of Breath     (Consider location/radiation/quality/duration/timing/severity/associated sxs/prior Treatment) Patient is a 29 y.o. male presenting with shortness of breath. The history is provided by the patient (the pt complains of cough and wheezing).  Shortness of Breath Severity:  Moderate Onset quality:  Gradual Timing:  Intermittent Progression:  Waxing and waning Chronicity:  Recurrent Context: not activity   Associated symptoms: wheezing   Associated symptoms: no abdominal pain, no chest pain, no cough, no headaches and no rash     Past Medical History  Diagnosis Date  . HIV (human immunodeficiency virus infection)   . Bipolar 1 disorder   . Schizophrenia   . Asthma   . Hypertension    Past Surgical History  Procedure Laterality Date  . Dental surgery     Family History  Problem Relation Age of Onset  . Huntington's disease Father   . Heart disease Mother    History  Substance Use Topics  . Smoking status: Current Every Day Smoker -- 0.50 packs/day    Types: Cigarettes    Start date: 09/29/1991  . Smokeless tobacco: Never Used  . Alcohol Use: 40.0 oz/week    80 Standard drinks or equivalent per week     Comment: once week     Review of Systems  Constitutional: Negative for appetite change and fatigue.  HENT: Negative for congestion, ear discharge and sinus pressure.   Eyes: Negative for discharge.  Respiratory: Positive for shortness of breath and wheezing. Negative for cough.   Cardiovascular: Negative for chest pain.  Gastrointestinal: Negative for abdominal pain and diarrhea.  Genitourinary: Negative for frequency and hematuria.  Musculoskeletal: Negative for back pain.  Skin: Negative for rash.  Neurological: Negative for seizures and headaches.  Psychiatric/Behavioral:  Negative for hallucinations.      Allergies  Atripla; Magnesium-containing compounds; Esomeprazole magnesium; Peanuts; Penicillins; and Bactrim  Home Medications   Prior to Admission medications   Medication Sig Start Date End Date Taking? Authorizing Provider  Abacavir-Dolutegravir-Lamivud (TRIUMEQ) 600-50-300 MG TABS Take 1 tablet by mouth daily. For HIV infection 11/19/14   Gardiner Barefootobert W Comer, MD  albuterol (PROVENTIL HFA;VENTOLIN HFA) 108 (90 BASE) MCG/ACT inhaler Inhale 2 puffs into the lungs every 6 (six) hours as needed for wheezing or shortness of breath. 10/29/14   Sanjuana KavaAgnes I Nwoko, NP  azithromycin (ZITHROMAX Z-PAK) 250 MG tablet 2 po day one, then 1 daily x 4 days 12/22/14   Bethann BerkshireJoseph Samer Dutton, MD  chlorpheniramine-HYDROcodone [facility](TUSSIONEX PENNKINETIC ER) 10-8 MG/5ML LQCR Take 5 mLs by mouth every 12 (twelve) hours as needed for cough. 12/22/14   Bethann BerkshireJoseph Sal Spratley, MD  divalproex (DEPAKOTE ER) 500 MG 24 hr tablet Take 1 tablet (500 mg total) by mouth 2 (two) times daily. For mood stabilization 10/29/14   Sanjuana KavaAgnes I Nwoko, NP  gabapentin (NEURONTIN) 300 MG capsule Take 1 capsule (300 mg total) by mouth 3 (three) times daily. For agitation 10/29/14   Sanjuana KavaAgnes I Nwoko, NP  traZODone (DESYREL) 100 MG tablet Take 1 tablet (100 mg total) by mouth at bedtime as needed for sleep. 10/29/14   Sanjuana KavaAgnes I Nwoko, NP   BP 135/73 mmHg  Pulse 107  Temp(Src) 98.5 F (36.9 C)  Resp 20  Ht 5\' 7"  (1.702 m)  Wt 167 lb (75.751 kg)  BMI 26.15 kg/m2  SpO2 96% Physical Exam  Constitutional: He is oriented to person, place, and time. He appears well-developed.  HENT:  Head: Normocephalic.  Eyes: Conjunctivae and EOM are normal. No scleral icterus.  Neck: Neck supple. No thyromegaly present.  Cardiovascular: Normal rate and regular rhythm.  Exam reveals no gallop and no friction rub.   No murmur heard. Pulmonary/Chest: No stridor. He has wheezes. He has no rales. He exhibits no tenderness.  Abdominal: He exhibits no distension.  There is no tenderness. There is no rebound.  Musculoskeletal: Normal range of motion. He exhibits no edema.  Lymphadenopathy:    He has no cervical adenopathy.  Neurological: He is oriented to person, place, and time. He exhibits normal muscle tone. Coordination normal.  Skin: No rash noted. No erythema.  Psychiatric: He has a normal mood and affect. His behavior is normal.    ED Course  Procedures (including critical care time) Labs Review Labs Reviewed - No data to display  Imaging Review Dg Chest 2 View  12/22/2014   CLINICAL DATA:  Shortness of  EXAM: CHEST  2 VIEW  COMPARISON:  06/06/2004  FINDINGS: Normal cardiac mediastinal contours. No consolidative pulmonary opacities. No pleural effusion or pneumothorax.  IMPRESSION: No acute cardiopulmonary process.   Electronically Signed   By: Annia Belt M.D.   On: 12/22/2014 16:51     EKG Interpretation None      MDM   Final diagnoses:  Bronchitis    Bronchitis with bronchospasm,   tx with zpak,  Albuterol and follow up prn  The chart was scribed for me under my direct supervision.  I personally performed the history, physical, and medical decision making and all procedures in the evaluation of this patient.Bethann Berkshire, MD 12/22/14 [phone]

## 2014-12-22 NOTE — Discharge Instructions (Signed)
Follow up next week if not improving. °

## 2015-01-12 ENCOUNTER — Emergency Department (HOSPITAL_COMMUNITY): Payer: Self-pay

## 2015-01-12 ENCOUNTER — Encounter (HOSPITAL_COMMUNITY): Payer: Self-pay | Admitting: Emergency Medicine

## 2015-01-12 ENCOUNTER — Emergency Department (HOSPITAL_COMMUNITY)
Admission: EM | Admit: 2015-01-12 | Discharge: 2015-01-12 | Disposition: A | Discharge status: Home / Self Care | Payer: Self-pay | Attending: Emergency Medicine | Admitting: Emergency Medicine

## 2015-01-12 DIAGNOSIS — Z21 Asymptomatic human immunodeficiency virus [HIV] infection status: Secondary | ICD-10-CM | POA: Insufficient documentation

## 2015-01-12 DIAGNOSIS — Z720 Tobacco use: Secondary | ICD-10-CM | POA: Insufficient documentation

## 2015-01-12 DIAGNOSIS — Z880 Allergy status to penicillin: Secondary | ICD-10-CM | POA: Insufficient documentation

## 2015-01-12 DIAGNOSIS — F209 Schizophrenia, unspecified: Secondary | ICD-10-CM | POA: Insufficient documentation

## 2015-01-12 DIAGNOSIS — Z79899 Other long term (current) drug therapy: Secondary | ICD-10-CM | POA: Insufficient documentation

## 2015-01-12 DIAGNOSIS — F319 Bipolar disorder, unspecified: Secondary | ICD-10-CM | POA: Insufficient documentation

## 2015-01-12 DIAGNOSIS — J45901 Unspecified asthma with (acute) exacerbation: Secondary | ICD-10-CM | POA: Insufficient documentation

## 2015-01-12 DIAGNOSIS — R0981 Nasal congestion: Secondary | ICD-10-CM | POA: Insufficient documentation

## 2015-01-12 DIAGNOSIS — R05 Cough: Secondary | ICD-10-CM | POA: Insufficient documentation

## 2015-01-12 DIAGNOSIS — F419 Anxiety disorder, unspecified: Secondary | ICD-10-CM | POA: Insufficient documentation

## 2015-01-12 DIAGNOSIS — R079 Chest pain, unspecified: Secondary | ICD-10-CM | POA: Insufficient documentation

## 2015-01-12 DIAGNOSIS — I10 Essential (primary) hypertension: Secondary | ICD-10-CM | POA: Insufficient documentation

## 2015-01-12 LAB — BASIC METABOLIC PANEL
Anion gap: 9 (ref 5–15)
BUN: 14 mg/dL (ref 6–23)
CO2: 25 mmol/L (ref 19–32)
Calcium: 9 mg/dL (ref 8.4–10.5)
Chloride: 103 mmol/L (ref 96–112)
Creatinine, Ser: 0.84 mg/dL (ref 0.50–1.35)
GFR calc Af Amer: >90 mL/min (ref >90)
GFR calc non Af Amer: >90 mL/min (ref >90)
Glucose, Bld: 139 mg/dL — ABNORMAL HIGH (ref 70–99)
Potassium: 3.5 mmol/L (ref 3.5–5.1)
Sodium: 137 mmol/L (ref 135–145)

## 2015-01-12 LAB — CBC
HCT: 42.6 % (ref 39.0–52.0)
Hemoglobin: 14.2 g/dL (ref 13.0–17.0)
MCH: 29.7 pg (ref 26.0–34.0)
MCHC: 33.3 g/dL (ref 30.0–36.0)
MCV: 89.1 fL (ref 78.0–100.0)
Platelets: 250 10*3/uL (ref 150–400)
RBC: 4.78 MIL/uL (ref 4.22–5.81)
RDW: 13.5 % (ref 11.5–15.5)
WBC: 7.8 10*3/uL (ref 4.0–10.5)

## 2015-01-12 LAB — I-STAT TROPONIN, ED: Troponin i, poc: 0 ng/mL (ref 0.00–0.08)

## 2015-01-12 MED ORDER — ASPIRIN 325 MG PO TABS
325.0000 mg | ORAL_TABLET | Freq: Once | ORAL | Status: AC
  Administered 2015-01-12: 325 mg via ORAL
  Filled 2015-01-12: qty 1

## 2015-01-12 MED ORDER — NAPROXEN 500 MG PO TABS: 500.0000 mg | ORAL_TABLET | Freq: Two times a day (BID) | ORAL | Status: DC

## 2015-01-12 MED ORDER — LORAZEPAM 1 MG PO TABS
1.0000 mg | ORAL_TABLET | Freq: Once | ORAL | Status: AC
  Administered 2015-01-12: 1 mg via ORAL
  Filled 2015-01-12: qty 1

## 2015-01-12 MED ORDER — ALBUTEROL SULFATE HFA 108 (90 BASE) MCG/ACT IN AERS
2.0000 | INHALATION_SPRAY | RESPIRATORY_TRACT | Status: DC | PRN
  Administered 2015-01-12: 2 via RESPIRATORY_TRACT
  Filled 2015-01-12: qty 6.7

## 2015-01-12 NOTE — Discharge Instructions (Signed)

## 2015-01-12 NOTE — ED Notes (Signed)
Pt. Stated, I started having chest pain a couple of hours ago , Im homeless and been walking around since yesterday and suppose to stay in the shelter and missed the curfew.

## 2015-01-12 NOTE — ED Provider Notes (Signed)
CSN: [id number]     Arrival date & time 01/12/15  1032 History   First MD Initiated Contact with Patient 01/12/15 1046     Chief Complaint  Patient presents with  . Chest Pain  . Cough     (Consider location/radiation/quality/duration/timing/severity/associated sxs/prior Treatment) HPI Pt is a 29yo male with hx of HIV, bipolar 1 disorder, schizophrenia, asthma, and HTN, presenting to ED with c/o left sided chest pain that started a few hours ago while walking around.  Pt states he is homeless and has been walking since yesterday.  Reports he is suppose to be staying at [facility] but will need a doctors not because he missed his curfew. Chest pain is constant, sharp and aching, on left side of chest, does no radiate. Pain is 10/10.  Pain started while pt was walking and became more sever with deep breathing and coughing so pt became anxious and worried something was wrong. Exertion makes pain worse.  Leaning forward or back does not change pain. Rest improves pain.  Pt states it does feel similar to previous panic attacks.  Pt also reports being tx for bronchitis a few weeks ago in [facility] and has since completed a course of antibiotics.  Per medical records, pt was seen on 12/22/14 at [facility], prescribed azithromycin and tussinex. Reports cough is still present. Denies fever, chills, n/v/d. No hx of blood clots. Denies leg pain or swelling. Denies trauma to chest. Denies recent cocaine use.  Denies hx of CAD.      Pt is followed by ID.  Labs from 11/06/14- CD4%: WNL, Viral load: undetectable   Past Medical History  Diagnosis Date  . HIV (human immunodeficiency virus infection)   . Bipolar 1 disorder   . Schizophrenia   . Asthma   . Hypertension    Past Surgical History  Procedure Laterality Date  . Dental surgery     Family History  Problem Relation Age of Onset  . Huntington's disease Father   . Heart disease Mother    History  Substance Use Topics  . Smoking  status: Current Every Day Smoker -- 0.50 packs/day    Types: Cigarettes    Start date: 09/29/1991  . Smokeless tobacco: Never Used  . Alcohol Use: 40.0 oz/week    80 Standard drinks or equivalent per week     Comment: once week     Review of Systems  Constitutional: Negative for fever, chills, diaphoresis, appetite change and fatigue.  HENT: Positive for congestion.   Respiratory: Positive for cough and shortness of breath.   Cardiovascular: Positive for chest pain and palpitations. Negative for leg swelling.  Gastrointestinal: Negative for nausea, vomiting, abdominal pain, diarrhea and constipation.  Musculoskeletal: Positive for myalgias ( left side of chest). Negative for back pain.  Skin: Negative for color change, rash and wound.  Neurological: Negative for dizziness, syncope, light-headedness and headaches.  All other systems reviewed and are negative.     Allergies  Atripla; Magnesium-containing compounds; Esomeprazole magnesium; Peanuts; Penicillins; and Bactrim  Home Medications   Prior to Admission medications   Medication Sig Start Date End Date Taking? Authorizing Provider  Abacavir-Dolutegravir-Lamivud (TRIUMEQ) 600-50-300 MG TABS Take 1 tablet by mouth daily. For HIV infection 11/19/14  Yes Gardiner Barefoot, MD  albuterol (PROVENTIL HFA;VENTOLIN HFA) 108 (90 BASE) MCG/ACT inhaler Inhale 2 puffs into the lungs every 6 (six) hours as needed for wheezing or shortness of breath. 10/29/14  Yes Sanjuana Kava, NP  busPIRone (BUSPAR)  10 MG tablet Take 20 mg by mouth 2 (two) times daily.  01/03/15  Yes Historical Provider, MD  divalproex (DEPAKOTE ER) 500 MG 24 hr tablet Take 1 tablet (500 mg total) by mouth 2 (two) times daily. For mood stabilization 10/29/14  Yes Sanjuana KavaAgnes I Nwoko, NP  gabapentin (NEURONTIN) 300 MG capsule Take 1 capsule (300 mg total) by mouth 3 (three) times daily. For agitation 10/29/14  Yes Sanjuana KavaAgnes I Nwoko, NP  traZODone (DESYREL) 50 MG tablet Take 100 mg by mouth at  bedtime.  12/12/14  Yes Historical Provider, MD  azithromycin (ZITHROMAX Z-PAK) 250 MG tablet 2 po day one, then 1 daily x 4 days Patient not taking: Reported on 01/12/2015 12/22/14   Bethann BerkshireJoseph Zammit, MD  chlorpheniramine-HYDROcodone [facility](TUSSIONEX PENNKINETIC ER) 10-8 MG/5ML LQCR Take 5 mLs by mouth every 12 (twelve) hours as needed for cough. Patient not taking: Reported on 01/12/2015 12/22/14   Bethann BerkshireJoseph Zammit, MD  naproxen (NAPROSYN) 500 MG tablet Take 1 tablet (500 mg total) by mouth 2 (two) times daily. 01/12/15   Junius FinnerErin O'Malley, PA-C  traZODone (DESYREL) 100 MG tablet Take 1 tablet (100 mg total) by mouth at bedtime as needed for sleep. Patient not taking: Reported on 01/12/2015 10/29/14   Sanjuana KavaAgnes I Nwoko, NP   BP 135/93 mmHg  Pulse 79  Temp(Src) 98.7 F (37.1 C) (Oral)  Resp 18  Ht 5\' 7"  (1.702 m)  Wt 165 lb (74.844 kg)  BMI 25.84 kg/m2  SpO2 99% Physical Exam  Constitutional: He appears well-developed and well-nourished. He appears distressed.  Pt sitting up in exam bed, appears mildly anxious and uncomfortable, rubbing left side of chest,  HENT:  Head: Normocephalic and atraumatic.  Eyes: Conjunctivae are normal. No scleral icterus.  Neck: Normal range of motion. Neck supple.  Cardiovascular: Normal rate, regular rhythm and normal heart sounds.   Pulmonary/Chest: Effort normal and breath sounds normal. No respiratory distress. He has no wheezes. He has no rales. He exhibits tenderness.  No respiratory distress, able to speak in full sentences w/o difficulty. Lungs: CTAB, left anterior and lateral chest wall tenderness in lower aspect of chest wall. No crepitus. No deformity.   Abdominal: Soft. Bowel sounds are normal. He exhibits no distension and no mass. There is no tenderness. There is no rebound and no guarding.  Musculoskeletal: Normal range of motion.  Neurological: He is alert.  Skin: Skin is warm and dry.  Chest wall: skin in tact, no ecchymosis or erythema. No rash.   Psychiatric: His  mood appears anxious.  Nursing note and vitals reviewed.   ED Course  Procedures (including critical care time) Labs Review Labs Reviewed  BASIC METABOLIC PANEL - Abnormal; Notable for the following:    Glucose, Bld 139 (*)    All other components within normal limits  CBC  I-STAT TROPOININ, ED    Imaging Review Dg Chest 2 View  01/12/2015   CLINICAL DATA:  Pt c/o new onset chest pain today. Describes pain that start as a pressure on the left side of chest and then switched to a stabbing pain. Pain worsens on deep inhalation. Smoker 1/2ppd. Also c/o sob that accompanies chest pain  EXAM: CHEST  2 VIEW  COMPARISON:  12/22/2014  FINDINGS: Normal heart, mediastinum and hila. Clear lungs. No pleural effusion or pneumothorax. Skeletal structures are unremarkable.  IMPRESSION: Normal chest radiographs.   Electronically Signed   By: Amie Portlandavid  Ormond M.D.   On: 01/12/2015 12:15     EKG Interpretation  Date/Time:  Saturday January 12 2015 10:38:36 EDT Ventricular Rate:  88 PR Interval:  144 QRS Duration: 84 QT Interval:  340 QTC Calculation: 411 R Axis:   88 Text Interpretation:  Normal sinus rhythm Right atrial enlargement ST  elevation, consider early repolarization, pericarditis, or injury Abnormal  ECG ST elevation seen in prior Confirmed by [facility]  MD, BLAIR ([id number]) on  01/12/2015 10:50:39 AM      MDM   Final diagnoses:  Left sided chest pain  Anxiety    Pt is a 28yo male, recently tx for bronchitis with azithromycin, presenting to ED with left sided chest pain. Pt states feels similar to previous panic attacks. No previous cardiac hx. No recent use of cocaine.  No fever, chills, n/v/d. CP somewhat atypical for ACS as it is worse with inspiration, cough, and palpation.  Pt is PERC negative, doubt PE.  CXR performed to ensure no pneumothorax or pneumonia.  Vitals: WNL   EKG: similar to previous  Troponin: negative for elevation CXR: unremarkable. CBC and BMP: unremarkable.    12:25 PM Pt sleeping, had to be awakened with gentle shaking. Pt states he is feeling better, requesting something to eat, a bus pass, and a note for "bed rest" at [facility].  Will provide PO fluids and sandwhich in ED as well as note for [facility]. Albuterol inhaler refilled in ED.  Rx: naproxen. Home care instructions provided.  Return precautions provided. Pt verbalized understanding and agreement with tx plan.      Junius Finner, PA-C 01/12/15 1234  Elwin Mocha, MD 01/14/15 [phone]

## 2015-01-21 ENCOUNTER — Emergency Department (HOSPITAL_COMMUNITY)
Admission: EM | Admit: 2015-01-21 | Discharge: 2015-01-21 | Disposition: A | Discharge status: Home / Self Care | Payer: Self-pay | Attending: Emergency Medicine | Admitting: Emergency Medicine

## 2015-01-21 ENCOUNTER — Encounter (HOSPITAL_COMMUNITY): Payer: Self-pay | Admitting: Emergency Medicine

## 2015-01-21 DIAGNOSIS — J45909 Unspecified asthma, uncomplicated: Secondary | ICD-10-CM | POA: Insufficient documentation

## 2015-01-21 DIAGNOSIS — Z880 Allergy status to penicillin: Secondary | ICD-10-CM | POA: Insufficient documentation

## 2015-01-21 DIAGNOSIS — J302 Other seasonal allergic rhinitis: Secondary | ICD-10-CM

## 2015-01-21 DIAGNOSIS — R1110 Vomiting, unspecified: Secondary | ICD-10-CM | POA: Insufficient documentation

## 2015-01-21 DIAGNOSIS — Z21 Asymptomatic human immunodeficiency virus [HIV] infection status: Secondary | ICD-10-CM | POA: Insufficient documentation

## 2015-01-21 DIAGNOSIS — Z720 Tobacco use: Secondary | ICD-10-CM | POA: Insufficient documentation

## 2015-01-21 DIAGNOSIS — J069 Acute upper respiratory infection, unspecified: Secondary | ICD-10-CM | POA: Insufficient documentation

## 2015-01-21 DIAGNOSIS — Z79899 Other long term (current) drug therapy: Secondary | ICD-10-CM | POA: Insufficient documentation

## 2015-01-21 DIAGNOSIS — Z8659 Personal history of other mental and behavioral disorders: Secondary | ICD-10-CM | POA: Insufficient documentation

## 2015-01-21 DIAGNOSIS — I10 Essential (primary) hypertension: Secondary | ICD-10-CM | POA: Insufficient documentation

## 2015-01-21 DIAGNOSIS — B9789 Other viral agents as the cause of diseases classified elsewhere: Secondary | ICD-10-CM

## 2015-01-21 MED ORDER — LORATADINE 10 MG PO TABS: 10.0000 mg | ORAL_TABLET | Freq: Every day | ORAL | Status: DC

## 2015-01-21 MED ORDER — IBUPROFEN 400 MG PO TABS
800.0000 mg | ORAL_TABLET | Freq: Once | ORAL | Status: AC
  Administered 2015-01-21: 800 mg via ORAL
  Filled 2015-01-21: qty 2

## 2015-01-21 MED ORDER — LORATADINE 10 MG PO TABS
10.0000 mg | ORAL_TABLET | Freq: Once | ORAL | Status: AC
  Administered 2015-01-21: 10 mg via ORAL
  Filled 2015-01-21: qty 1

## 2015-01-21 MED ORDER — IBUPROFEN 600 MG PO TABS: 600.0000 mg | ORAL_TABLET | Freq: Four times a day (QID) | ORAL | Status: DC | PRN

## 2015-01-21 NOTE — ED Notes (Signed)
Pt to ED via [facility] with  C/o allergies.  Pt c/o runny nose and cough.  Pt also c/o abscess to right thigh area

## 2015-01-21 NOTE — ED Notes (Signed)
Patient here with complaint of upper respiratory infection starting today. Comes [facility]. Reports nasal congestion and cough.

## 2015-01-21 NOTE — Discharge Instructions (Signed)
Allergic Rhinitis Allergic rhinitis is when the mucous membranes in the nose respond to allergens. Allergens are particles in the air that cause your body to have an allergic reaction. This causes you to release allergic antibodies. Through a chain of events, these eventually cause you to release histamine into the blood stream. Although meant to protect the body, it is this release of histamine that causes your discomfort, such as frequent sneezing, congestion, and an itchy, runny nose.  CAUSES  Seasonal allergic rhinitis (hay fever) is caused by pollen allergens that may come from grasses, trees, and weeds. Year-round allergic rhinitis (perennial allergic rhinitis) is caused by allergens such as house dust mites, pet dander, and mold spores.  SYMPTOMS   Nasal stuffiness (congestion).  Itchy, runny nose with sneezing and tearing of the eyes. DIAGNOSIS  Your health care provider can help you determine the allergen or allergens that trigger your symptoms. If you and your health care provider are unable to determine the allergen, skin or blood testing may be used. TREATMENT  Allergic rhinitis does not have a cure, but it can be controlled by:  Medicines and allergy shots (immunotherapy).  Avoiding the allergen. Hay fever may often be treated with antihistamines in pill or nasal spray forms. Antihistamines block the effects of histamine. There are over-the-counter medicines that may help with nasal congestion and swelling around the eyes. Check with your health care provider before taking or giving this medicine.  If avoiding the allergen or the medicine prescribed do not work, there are many new medicines your health care provider can prescribe. Stronger medicine may be used if initial measures are ineffective. Desensitizing injections can be used if medicine and avoidance does not work. Desensitization is when a patient is given ongoing shots until the body becomes less sensitive to the allergen.  Make sure you follow up with your health care provider if problems continue. HOME CARE INSTRUCTIONS It is not possible to completely avoid allergens, but you can reduce your symptoms by taking steps to limit your exposure to them. It helps to know exactly what you are allergic to so that you can avoid your specific triggers. SEEK MEDICAL CARE IF:   You have a fever.  You develop a cough that does not stop easily (persistent).  You have shortness of breath.  You start wheezing.  Symptoms interfere with normal daily activities. Document Released: 06/09/2001 Document Revised: 09/19/2013 Document Reviewed: 05/22/2013 ExitCare Patient Information 2015 [employer]. This information is not intended to replace advice given to you by your health care provider. Make sure you discuss any questions you have with your health care provider.  Upper Respiratory Infection, Adult An upper respiratory infection (URI) is also sometimes known as the common cold. The upper respiratory tract includes the nose, sinuses, throat, trachea, and bronchi. Bronchi are the airways leading to the lungs. Most people improve within 1 week, but symptoms can last up to 2 weeks. A residual cough may last even longer.  CAUSES Many different viruses can infect the tissues lining the upper respiratory tract. The tissues become irritated and inflamed and often become very moist. Mucus production is also common. A cold is contagious. You can easily spread the virus to others by oral contact. This includes kissing, sharing a glass, coughing, or sneezing. Touching your mouth or nose and then touching a surface, which is then touched by another person, can also spread the virus. SYMPTOMS  Symptoms typically develop 1 to 3 days after you come in contact   with a cold virus. Symptoms vary from person to person. They may include:  Runny nose.  Sneezing.  Nasal congestion.  Sinus irritation.  Sore throat.  Loss of voice  (laryngitis).  Cough.  Fatigue.  Muscle aches.  Loss of appetite.  Headache.  Low-grade fever. DIAGNOSIS  You might diagnose your own cold based on familiar symptoms, since most people get a cold 2 to 3 times a year. Your caregiver can confirm this based on your exam. Most importantly, your caregiver can check that your symptoms are not due to another disease such as strep throat, sinusitis, pneumonia, asthma, or epiglottitis. Blood tests, throat tests, and X-rays are not necessary to diagnose a common cold, but they may sometimes be helpful in excluding other more serious diseases. Your caregiver will decide if any further tests are required. RISKS AND COMPLICATIONS  You may be at risk for a more severe case of the common cold if you smoke cigarettes, have chronic heart disease (such as heart failure) or lung disease (such as asthma), or if you have a weakened immune system. The very young and very old are also at risk for more serious infections. Bacterial sinusitis, middle ear infections, and bacterial pneumonia can complicate the common cold. The common cold can worsen asthma and chronic obstructive pulmonary disease (COPD). Sometimes, these complications can require emergency medical care and may be life-threatening. PREVENTION  The best way to protect against getting a cold is to practice good hygiene. Avoid oral or hand contact with people with cold symptoms. Wash your hands often if contact occurs. There is no clear evidence that vitamin C, vitamin E, echinacea, or exercise reduces the chance of developing a cold. However, it is always recommended to get plenty of rest and practice good nutrition. TREATMENT  Treatment is directed at relieving symptoms. There is no cure. Antibiotics are not effective, because the infection is caused by a virus, not by bacteria. Treatment may include:  Increased fluid intake. Sports drinks offer valuable electrolytes, sugars, and fluids.  Breathing  heated mist or steam (vaporizer or shower).  Eating chicken soup or other clear broths, and maintaining good nutrition.  Getting plenty of rest.  Using gargles or lozenges for comfort.  Controlling fevers with ibuprofen or acetaminophen as directed by your caregiver.  Increasing usage of your inhaler if you have asthma. Zinc gel and zinc lozenges, taken in the first 24 hours of the common cold, can shorten the duration and lessen the severity of symptoms. Pain medicines may help with fever, muscle aches, and throat pain. A variety of non-prescription medicines are available to treat congestion and runny nose. Your caregiver can make recommendations and may suggest nasal or lung inhalers for other symptoms.  HOME CARE INSTRUCTIONS   Only take over-the-counter or prescription medicines for pain, discomfort, or fever as directed by your caregiver.  Use a warm mist humidifier or inhale steam from a shower to increase air moisture. This may keep secretions moist and make it easier to breathe.  Drink enough water and fluids to keep your urine clear or pale yellow.  Rest as needed.  Return to work when your temperature has returned to normal or as your caregiver advises. You may need to stay home longer to avoid infecting others. You can also use a face mask and careful hand washing to prevent spread of the virus. SEEK MEDICAL CARE IF:   After the first few days, you feel you are getting worse rather than better.  You need   your caregiver's advice about medicines to control symptoms.  You develop chills, worsening shortness of breath, or brown or red sputum. These may be signs of pneumonia.  You develop yellow or brown nasal discharge or pain in the face, especially when you bend forward. These may be signs of sinusitis.  You develop a fever, swollen neck glands, pain with swallowing, or white areas in the back of your throat. These may be signs of strep throat. SEEK IMMEDIATE MEDICAL CARE  IF:   You have a fever.  You develop severe or persistent headache, ear pain, sinus pain, or chest pain.  You develop wheezing, a prolonged cough, cough up blood, or have a change in your usual mucus (if you have chronic lung disease).  You develop sore muscles or a stiff neck. Document Released: 03/10/2001 Document Revised: 12/07/2011 Document Reviewed: 12/20/2013 ExitCare Patient Information 2015 [employer]. This information is not intended to replace advice given to you by your health care provider. Make sure you discuss any questions you have with your health care provider.   

## 2015-01-21 NOTE — ED Provider Notes (Signed)
CSN: [id number]     Arrival date & time 01/21/15  2135 History  This chart was scribed for non-physician practitioner, Edwin Morn, NP working with Doug Sou, MD by Greggory Stallion, ED scribe. This patient was seen in room TR10C/TR10C and the patient's care was started at 10:41 PM.   Chief Complaint  Patient presents with  . Nasal Congestion  . Cough   The history is provided by the patient. No language interpreter was used.    HPI Comments: Edwin Martinez is a 29 y.o. male with history of HIV, asthma, hypertension, bipolar disorder and schizophrenia who presents to the Emergency Department complaining of rhinorrhea, nasal congestion, eye watering, and nonproductive cough that started this morning. Symptoms started worsening around 5 PM and he started to develop generalized body aches. He has not yet taken any medications. Pt has had two episodes of emesis today. He is also complaining of an abscess to his right thigh. Pt denies abdominal pain. Pt's viral load is currently undetectable.   Past Medical History  Diagnosis Date  . HIV (human immunodeficiency virus infection)   . Bipolar 1 disorder   . Schizophrenia   . Asthma   . Hypertension    Past Surgical History  Procedure Laterality Date  . Dental surgery     Family History  Problem Relation Age of Onset  . Huntington's disease Father   . Heart disease Mother    History  Substance Use Topics  . Smoking status: Current Every Day Smoker -- 0.50 packs/day    Types: Cigarettes    Start date: 09/29/1991  . Smokeless tobacco: Never Used  . Alcohol Use: 40.0 oz/week    80 Standard drinks or equivalent per week     Comment: once week     Review of Systems  HENT: Positive for congestion and rhinorrhea.   Eyes: Positive for discharge (watering).  Respiratory: Positive for cough.   Gastrointestinal: Positive for vomiting. Negative for abdominal pain.  Musculoskeletal: Positive for myalgias.  Skin:       Abscess  All  other systems reviewed and are negative.  Allergies  Atripla; Magnesium-containing compounds; Esomeprazole magnesium; Peanuts; Penicillins; and Bactrim  Home Medications   Prior to Admission medications   Medication Sig Start Date End Date Taking? Authorizing Provider  Abacavir-Dolutegravir-Lamivud (TRIUMEQ) 600-50-300 MG TABS Take 1 tablet by mouth daily. For HIV infection 11/19/14   Gardiner Barefoot, MD  albuterol (PROVENTIL HFA;VENTOLIN HFA) 108 (90 BASE) MCG/ACT inhaler Inhale 2 puffs into the lungs every 6 (six) hours as needed for wheezing or shortness of breath. 10/29/14   Sanjuana Kava, NP  azithromycin (ZITHROMAX Z-PAK) 250 MG tablet 2 po day one, then 1 daily x 4 days Patient not taking: Reported on 01/12/2015 12/22/14   Bethann Berkshire, MD  busPIRone (BUSPAR) 10 MG tablet Take 20 mg by mouth 2 (two) times daily.  01/03/15   Historical Provider, MD  chlorpheniramine-HYDROcodone (TUSSIONEX PENNKINETIC ER) 10-8 MG/5ML LQCR Take 5 mLs by mouth every 12 (twelve) hours as needed for cough. Patient not taking: Reported on 01/12/2015 12/22/14   Bethann Berkshire, MD  divalproex (DEPAKOTE ER) 500 MG 24 hr tablet Take 1 tablet (500 mg total) by mouth 2 (two) times daily. For mood stabilization 10/29/14   Sanjuana Kava, NP  gabapentin (NEURONTIN) 300 MG capsule Take 1 capsule (300 mg total) by mouth 3 (three) times daily. For agitation 10/29/14   Sanjuana Kava, NP  naproxen (NAPROSYN) 500 MG tablet Take 1  tablet (500 mg total) by mouth 2 (two) times daily. 01/12/15   Junius FinnerErin O'Malley, PA-C  traZODone (DESYREL) 100 MG tablet Take 1 tablet (100 mg total) by mouth at bedtime as needed for sleep. Patient not taking: Reported on 01/12/2015 10/29/14   Sanjuana KavaAgnes I Nwoko, NP  traZODone (DESYREL) 50 MG tablet Take 100 mg by mouth at bedtime.  12/12/14   Historical Provider, MD   BP 126/72 mmHg  Pulse 88  Temp(Src) 99 F (37.2 C) (Oral)  Resp 16  Ht 5\' 7"  (1.702 m)  Wt 165 lb (74.844 kg)  BMI 25.84 kg/m2  SpO2 98%   Physical  Exam  Constitutional: He is oriented to person, place, and time. He appears well-developed and well-nourished. No distress.  Low grade temp at 99.2  HENT:  Head: Normocephalic and atraumatic.  Right Ear: Tympanic membrane and ear canal normal.  Left Ear: Tympanic membrane and ear canal normal.  Mouth/Throat: Posterior oropharyngeal erythema present. No oropharyngeal exudate.  Clear nasal drainage.  Eyes: Conjunctivae and EOM are normal.  Neck: Neck supple. No tracheal deviation present.  Cardiovascular: Normal rate, regular rhythm and normal heart sounds.   Pulmonary/Chest: Effort normal and breath sounds normal. No respiratory distress.  Musculoskeletal: Normal range of motion.  Lymphadenopathy:    He has no cervical adenopathy.  Neurological: He is alert and oriented to person, place, and time.  Skin: Skin is warm and dry.  Psychiatric: He has a normal mood and affect. His behavior is normal.  Nursing note and vitals reviewed.   ED Course  Procedures (including critical care time)  DIAGNOSTIC STUDIES: Oxygen Saturation is 98% on RA, normal by my interpretation.    COORDINATION OF CARE: 10:47 PM-Temperature rechecked in room and was 99.2. Discussed treatment plan which includes symptomatic treatment and warm compresses with pt at bedside and pt agreed to plan. Return precautions given.  Labs Review Labs Reviewed - No data to display  Imaging Review No results found.   EKG Interpretation None      MDM   Final diagnoses:  None    Patient with symptoms consistent with URI and allergic rhinitis.  Small area of tenderness inner aspect of right upper thigh without fluctuance or induration.  Recommend warm compresses to area.  Ibuprofen for muscle aches and fever.  Claritin for rhinitis.  I personally performed the services described in this documentation, which was scribed in my presence. The recorded information has been reviewed and is accurate.   Edwin Mornavid Edwin Saperstein,  NP 01/21/15 [id number]  Doug SouSam Jacubowitz, MD 01/22/15 [phone]

## 2015-02-05 ENCOUNTER — Other Ambulatory Visit (INDEPENDENT_AMBULATORY_CARE_PROVIDER_SITE_OTHER): Payer: Self-pay

## 2015-02-05 DIAGNOSIS — B20 Human immunodeficiency virus [HIV] disease: Secondary | ICD-10-CM

## 2015-02-05 DIAGNOSIS — Z79899 Other long term (current) drug therapy: Secondary | ICD-10-CM

## 2015-02-05 DIAGNOSIS — Z113 Encounter for screening for infections with a predominantly sexual mode of transmission: Secondary | ICD-10-CM

## 2015-02-05 LAB — LIPID PANEL
Cholesterol: 136 mg/dL (ref 0–200)
HDL: 54 mg/dL (ref >=40)
LDL CALC: 53 mg/dL (ref 0–99)
Total CHOL/HDL Ratio: 2.5 Ratio
Triglycerides: 147 mg/dL (ref <150)
VLDL: 29 mg/dL (ref 0–40)

## 2015-02-05 NOTE — Addendum Note (Signed)
Addended by: Mariea ClontsGREEN, [place] Zou D on: 02/05/2015 03:00 PM   Modules accepted: Orders

## 2015-02-06 LAB — URINE CYTOLOGY ANCILLARY ONLY
CHLAMYDIA, DNA PROBE: NEGATIVE
NEISSERIA GONORRHEA: NEGATIVE

## 2015-02-06 LAB — T-HELPER CELL (CD4) - (RCID CLINIC ONLY)
CD4 % Helper T Cell: 42 % (ref 33–55)
CD4 T Cell Abs: 1630 /uL (ref 400–2700)

## 2015-02-07 LAB — HIV-1 RNA QUANT-NO REFLEX-BLD: HIV 1 RNA Quant: <20 copies/mL (ref <20)

## 2015-02-19 ENCOUNTER — Ambulatory Visit (INDEPENDENT_AMBULATORY_CARE_PROVIDER_SITE_OTHER): Payer: Self-pay | Admitting: Internal Medicine

## 2015-02-19 ENCOUNTER — Encounter: Payer: Self-pay | Admitting: Internal Medicine

## 2015-02-19 VITALS — BP 130/77 | HR 60 | Temp 97.7°F | Wt 160.0 lb

## 2015-02-19 DIAGNOSIS — R21 Rash and other nonspecific skin eruption: Secondary | ICD-10-CM

## 2015-02-19 DIAGNOSIS — T300 Burn of unspecified body region, unspecified degree: Secondary | ICD-10-CM

## 2015-02-19 DIAGNOSIS — B20 Human immunodeficiency virus [HIV] disease: Secondary | ICD-10-CM

## 2015-02-19 MED ORDER — HYDRASYN25 EX CREA: 1.0000 | TOPICAL_CREAM | Freq: Every day | CUTANEOUS | Status: DC | PRN
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
## 2015-03-18 ENCOUNTER — Telehealth: Payer: Self-pay | Admitting: *Deleted

## 2015-04-01 ENCOUNTER — Encounter (HOSPITAL_COMMUNITY): Payer: Self-pay | Admitting: Emergency Medicine

## 2015-04-01 ENCOUNTER — Emergency Department (HOSPITAL_COMMUNITY)
Admission: EM | Admit: 2015-04-01 | Discharge: 2015-04-01 | Disposition: A | Discharge status: Home / Self Care | Payer: Self-pay | Attending: Emergency Medicine | Admitting: Emergency Medicine

## 2015-04-01 DIAGNOSIS — F319 Bipolar disorder, unspecified: Secondary | ICD-10-CM | POA: Insufficient documentation

## 2015-04-01 DIAGNOSIS — Z658 Other specified problems related to psychosocial circumstances: Secondary | ICD-10-CM

## 2015-04-01 DIAGNOSIS — Z21 Asymptomatic human immunodeficiency virus [HIV] infection status: Secondary | ICD-10-CM | POA: Insufficient documentation

## 2015-04-01 DIAGNOSIS — F209 Schizophrenia, unspecified: Secondary | ICD-10-CM | POA: Insufficient documentation

## 2015-04-01 DIAGNOSIS — J45909 Unspecified asthma, uncomplicated: Secondary | ICD-10-CM | POA: Insufficient documentation

## 2015-04-01 DIAGNOSIS — Z720 Tobacco use: Secondary | ICD-10-CM | POA: Insufficient documentation

## 2015-04-01 DIAGNOSIS — I10 Essential (primary) hypertension: Secondary | ICD-10-CM | POA: Insufficient documentation

## 2015-04-01 DIAGNOSIS — Z880 Allergy status to penicillin: Secondary | ICD-10-CM | POA: Insufficient documentation

## 2015-04-01 DIAGNOSIS — Z79899 Other long term (current) drug therapy: Secondary | ICD-10-CM | POA: Insufficient documentation

## 2015-04-01 DIAGNOSIS — Z639 Problem related to primary support group, unspecified: Secondary | ICD-10-CM | POA: Insufficient documentation

## 2015-04-01 MED ORDER — IBUPROFEN 200 MG PO TABS
600.0000 mg | ORAL_TABLET | Freq: Once | ORAL | Status: AC
  Administered 2015-04-01: 600 mg via ORAL
  Filled 2015-04-01: qty 3

## 2015-04-01 NOTE — BH Assessment (Addendum)
TTS Counselor assessed patient and patient was discharged by EDP Dr. Juleen ChinaKohut before Psychiatric Consult.
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
## 2015-04-01 NOTE — ED Notes (Signed)
Bed: WA31 Expected date:  Expected time:  Means of arrival:  Comments: 
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
## 2015-05-20 ENCOUNTER — Emergency Department (HOSPITAL_COMMUNITY)
Admission: EM | Admit: 2015-05-20 | Discharge: 2015-05-20 | Disposition: A | Discharge status: Home / Self Care | Payer: Self-pay | Attending: Physician Assistant | Admitting: Physician Assistant

## 2015-05-20 ENCOUNTER — Encounter (HOSPITAL_COMMUNITY): Payer: Self-pay

## 2015-05-20 DIAGNOSIS — J45909 Unspecified asthma, uncomplicated: Secondary | ICD-10-CM | POA: Insufficient documentation

## 2015-05-20 DIAGNOSIS — Z720 Tobacco use: Secondary | ICD-10-CM | POA: Insufficient documentation

## 2015-05-20 DIAGNOSIS — Z880 Allergy status to penicillin: Secondary | ICD-10-CM | POA: Insufficient documentation

## 2015-05-20 DIAGNOSIS — Z79899 Other long term (current) drug therapy: Secondary | ICD-10-CM | POA: Insufficient documentation

## 2015-05-20 DIAGNOSIS — N5089 Other specified disorders of the male genital organs: Secondary | ICD-10-CM

## 2015-05-20 DIAGNOSIS — F319 Bipolar disorder, unspecified: Secondary | ICD-10-CM | POA: Insufficient documentation

## 2015-05-20 DIAGNOSIS — Z21 Asymptomatic human immunodeficiency virus [HIV] infection status: Secondary | ICD-10-CM | POA: Insufficient documentation

## 2015-05-20 DIAGNOSIS — I10 Essential (primary) hypertension: Secondary | ICD-10-CM | POA: Insufficient documentation

## 2015-05-20 DIAGNOSIS — N508 Other specified disorders of male genital organs: Secondary | ICD-10-CM | POA: Insufficient documentation

## 2015-05-20 MED ORDER — ACYCLOVIR 400 MG PO TABS: 400.0000 mg | ORAL_TABLET | Freq: Three times a day (TID) | ORAL | Status: DC

## 2015-05-20 NOTE — ED Notes (Addendum)
Per EMS, pt from home.  Pt c/o genital itching and blisters.  States he felt he got into poison ivy and touched groin by accident.  Vitals: 122 palp, 70 hr, resp 14, 98% ra

## 2015-05-20 NOTE — ED Provider Notes (Signed)
CSN: [id number]     Arrival date & time 05/20/15  1725 History   First MD Initiated Contact with Patient 05/20/15 1756     Chief Complaint  Patient presents with  . genital itching      (Consider location/radiation/quality/duration/timing/severity/associated sxs/prior Treatment) The history is provided by the patient and medical records.    29 year old male with history of HIV, bipolar disorder, schizophrenia, asthma, hypertension, presenting to the ED for genital lesions.  Patient states he thinks he accidentally touched some poison ivy the other day and masturbated and had sexual intercourse with his current partner and thinks it spread to his groin. Patient states he now has painful blisters to his penis.  He denies fever, chills.  He states he has been compliant with his HIV meds.  States has had same sexual partner for a few months now.  Denies penile discharge, dysuria, fever, chills.  VSS.  Past Medical History  Diagnosis Date  . HIV (human immunodeficiency virus infection)   . Bipolar 1 disorder   . Schizophrenia   . Asthma   . Hypertension    Past Surgical History  Procedure Laterality Date  . Dental surgery     Family History  Problem Relation Age of Onset  . Huntington's disease Father   . Heart disease Mother    Social History  Substance Use Topics  . Smoking status: Current Every Day Smoker -- 0.50 packs/day    Types: Cigarettes    Start date: 09/29/1991  . Smokeless tobacco: Never Used  . Alcohol Use: 40.0 oz/week    80 Standard drinks or equivalent per week     Comment: once week     Review of Systems  Skin: Positive for rash.  All other systems reviewed and are negative.     Allergies  Atripla; Magnesium-containing compounds; Esomeprazole magnesium; Bactrim; Peanuts; and Penicillins  Home Medications   Prior to Admission medications   Medication Sig Start Date End Date Taking? Authorizing Provider  Abacavir-Dolutegravir-Lamivud (TRIUMEQ)  600-50-300 MG TABS Take 1 tablet by mouth daily. For HIV infection 11/19/14   Gardiner Barefoot, MD  albuterol (PROVENTIL HFA;VENTOLIN HFA) 108 (90 BASE) MCG/ACT inhaler Inhale 2 puffs into the lungs every 6 (six) hours as needed for wheezing or shortness of breath. 10/29/14   Sanjuana Kava, NP  azithromycin (ZITHROMAX Z-PAK) 250 MG tablet 2 po day one, then 1 daily x 4 days Patient not taking: Reported on 01/12/2015 12/22/14   Bethann Berkshire, MD  busPIRone (BUSPAR) 10 MG tablet Take 20 mg by mouth 2 (two) times daily.  01/03/15   Historical Provider, MD  chlorpheniramine-HYDROcodone (TUSSIONEX PENNKINETIC ER) 10-8 MG/5ML LQCR Take 5 mLs by mouth every 12 (twelve) hours as needed for cough. Patient not taking: Reported on 01/12/2015 12/22/14   Bethann Berkshire, MD  divalproex (DEPAKOTE ER) 500 MG 24 hr tablet Take 1 tablet (500 mg total) by mouth 2 (two) times daily. For mood stabilization 10/29/14   Sanjuana Kava, NP  Emollient (HYDRASYN25) CREA Apply 1 Dose topically daily as needed. Patient not taking: Reported on 04/01/2015 02/19/15   Judyann Munson, MD  gabapentin (NEURONTIN) 300 MG capsule Take 1 capsule (300 mg total) by mouth 3 (three) times daily. For agitation Patient not taking: Reported on 04/01/2015 10/29/14   Sanjuana Kava, NP  ibuprofen (ADVIL,MOTRIN) 600 MG tablet Take 1 tablet (600 mg total) by mouth every 6 (six) hours as needed. Patient not taking: Reported on 04/01/2015 01/21/15   [name],  NP  loratadine (CLARITIN) 10 MG tablet Take 1 tablet (10 mg total) by mouth daily. One po daily x 5 days Patient not taking: Reported on 04/01/2015 01/21/15   [name], NP  naproxen (NAPROSYN) 500 MG tablet Take 1 tablet (500 mg total) by mouth 2 (two) times daily. Patient not taking: Reported on 04/01/2015 01/12/15   Junius Finner, PA-C  traZODone (DESYREL) 50 MG tablet Take 100 mg by mouth at bedtime as needed for sleep.  12/12/14   Historical Provider, MD   BP 118/68 mmHg  Pulse 56  Temp(Src) 97.7 F (36.5 C)  (Oral)  Resp 18  SpO2 99% Physical Exam  Constitutional: He is oriented to person, place, and time. He appears well-developed and well-nourished.  HENT:  Head: Normocephalic and atraumatic.  Mouth/Throat: Oropharynx is clear and moist.  Eyes: Conjunctivae and EOM are normal. Pupils are equal, round, and reactive to light.  Neck: Normal range of motion.  Cardiovascular: Normal rate, regular rhythm and normal heart sounds.   Pulmonary/Chest: Effort normal and breath sounds normal.  Abdominal: Soft. Bowel sounds are normal.  Genitourinary: Circumcised.  Multiple lesions noted to penis; appear small but are ulcerated and TTP; no penile discharge noted  Musculoskeletal: Normal range of motion.  Neurological: He is alert and oriented to person, place, and time.  Skin: Skin is warm and dry.  Psychiatric: He has a normal mood and affect.  Nursing note and vitals reviewed.   ED Course  Procedures (including critical care time) Labs Review Labs Reviewed - No data to display  Imaging Review No results found. I have personally reviewed and evaluated these images and lab results as part of my medical decision-making.   EKG Interpretation None      MDM   Final diagnoses:  Genital lesion, male   29 year old male here with new onset penile lesions. There are multiple lesions on exam which are ulcerated and tender to palpation. He has no penile discharge. This does not appear to be poison ivy, rather concerning findings for herpes.  Discussed plan with patient, he/she acknowledged understanding and agreed with plan of care.  Return precautions given for new or worsening symptoms.  Garlon Hatchet, PA-C 05/20/15 1832  Courteney Randall An, MD 05/20/15 [phone]

## 2015-05-20 NOTE — Discharge Instructions (Signed)
Take the prescribed medication as directed. Follow-up with your primary care physician. Would recommend that you discuss this ED visit with your partner so they can be tested/treated. Return to the ED for new or worsening symptoms.

## 2015-05-23 ENCOUNTER — Ambulatory Visit: Payer: Self-pay | Admitting: Internal Medicine

## 2015-06-05 ENCOUNTER — Encounter (HOSPITAL_COMMUNITY): Payer: Self-pay | Admitting: *Deleted

## 2015-06-05 ENCOUNTER — Emergency Department (HOSPITAL_COMMUNITY)
Admission: EM | Admit: 2015-06-05 | Discharge: 2015-06-05 | Disposition: A | Discharge status: Home / Self Care | Payer: Self-pay | Attending: Emergency Medicine | Admitting: Emergency Medicine

## 2015-06-05 DIAGNOSIS — Z21 Asymptomatic human immunodeficiency virus [HIV] infection status: Secondary | ICD-10-CM | POA: Insufficient documentation

## 2015-06-05 DIAGNOSIS — I10 Essential (primary) hypertension: Secondary | ICD-10-CM | POA: Insufficient documentation

## 2015-06-05 DIAGNOSIS — J45909 Unspecified asthma, uncomplicated: Secondary | ICD-10-CM | POA: Insufficient documentation

## 2015-06-05 DIAGNOSIS — Z720 Tobacco use: Secondary | ICD-10-CM | POA: Insufficient documentation

## 2015-06-05 DIAGNOSIS — Z79899 Other long term (current) drug therapy: Secondary | ICD-10-CM | POA: Insufficient documentation

## 2015-06-05 DIAGNOSIS — B029 Zoster without complications: Secondary | ICD-10-CM | POA: Insufficient documentation

## 2015-06-05 DIAGNOSIS — F319 Bipolar disorder, unspecified: Secondary | ICD-10-CM | POA: Insufficient documentation

## 2015-06-05 DIAGNOSIS — F209 Schizophrenia, unspecified: Secondary | ICD-10-CM | POA: Insufficient documentation

## 2015-06-05 DIAGNOSIS — Z880 Allergy status to penicillin: Secondary | ICD-10-CM | POA: Insufficient documentation

## 2015-06-05 DIAGNOSIS — B009 Herpesviral infection, unspecified: Secondary | ICD-10-CM

## 2015-06-05 MED ORDER — DOXYCYCLINE HYCLATE 100 MG PO CAPS: 100.0000 mg | ORAL_CAPSULE | Freq: Two times a day (BID) | ORAL | Status: DC

## 2015-06-05 NOTE — ED Provider Notes (Signed)
CSN: [id number]     Arrival date & time 06/05/15  1421 History  This chart was scribed for non-physician practitioner, Eyvonne Mechanic, PA-C working with Azalia Bilis, MD by Gwenyth Ober, ED scribe. This patient was seen in room TR08C/TR08C and the patient's care was started at 3:26 PM   Chief Complaint  Patient presents with  . Rash   The history is provided by the patient. No language interpreter was used.   HPI Comments: Edwin Martinez is a 29 y.o. male with a history of herpes and HIV who presents to the Emergency Department complaining of an unchanged rash to his lips and genitals, with associated moderate pain, that started 3 weeks ago. Pt was seen in the ED at the onset of symptoms and was prescribed acyclovir. He reports that the rash and the pain have continued with treatment. Pt is sexually active with males. He endorses condom use. His partners do not have similar symptoms. Pt had lab work 4 months ago which showed HIV 1 RNA not detected and normal T-cells. He denies fever, chills, nausea and vomiting.   Past Medical History  Diagnosis Date  . HIV (human immunodeficiency virus infection)   . Bipolar 1 disorder   . Schizophrenia   . Asthma   . Hypertension    Past Surgical History  Procedure Laterality Date  . Dental surgery     Family History  Problem Relation Age of Onset  . Huntington's disease Father   . Heart disease Mother    Social History  Substance Use Topics  . Smoking status: Current Every Day Smoker -- 0.50 packs/day    Types: Cigarettes    Start date: 09/29/1991  . Smokeless tobacco: Never Used  . Alcohol Use: 40.0 oz/week    80 Standard drinks or equivalent per week     Comment: once week     Review of Systems  All other systems reviewed and are negative.     Allergies  Atripla; Magnesium-containing compounds; Esomeprazole magnesium; Bactrim; Peanuts; and Penicillins  Home Medications   Prior to Admission medications   Medication Sig Start  Date End Date Taking? Authorizing Provider  Abacavir-Dolutegravir-Lamivud (TRIUMEQ) 600-50-300 MG TABS Take 1 tablet by mouth daily. For HIV infection 11/19/14   Gardiner Barefoot, MD  acyclovir (ZOVIRAX) 400 MG tablet Take 1 tablet (400 mg total) by mouth 3 (three) times daily. 05/20/15   Garlon Hatchet, PA-C  albuterol (PROVENTIL HFA;VENTOLIN HFA) 108 (90 BASE) MCG/ACT inhaler Inhale 2 puffs into the lungs every 6 (six) hours as needed for wheezing or shortness of breath. 10/29/14   Sanjuana Kava, NP  azithromycin (ZITHROMAX Z-PAK) 250 MG tablet 2 po day one, then 1 daily x 4 days Patient not taking: Reported on 01/12/2015 12/22/14   Bethann Berkshire, MD  busPIRone (BUSPAR) 10 MG tablet Take 20 mg by mouth 2 (two) times daily.  01/03/15   Historical Provider, MD  chlorpheniramine-HYDROcodone (TUSSIONEX PENNKINETIC ER) 10-8 MG/5ML LQCR Take 5 mLs by mouth every 12 (twelve) hours as needed for cough. Patient not taking: Reported on 01/12/2015 12/22/14   Bethann Berkshire, MD  divalproex (DEPAKOTE ER) 500 MG 24 hr tablet Take 1 tablet (500 mg total) by mouth 2 (two) times daily. For mood stabilization 10/29/14   Sanjuana Kava, NP  doxycycline (VIBRAMYCIN) 100 MG capsule Take 1 capsule (100 mg total) by mouth 2 (two) times daily. 06/05/15   Eyvonne Mechanic, PA-C  Emollient (HYDRASYN25) CREA Apply 1 Dose topically daily as  needed. Patient not taking: Reported on 04/01/2015 02/19/15   Judyann Munson, MD  gabapentin (NEURONTIN) 300 MG capsule Take 1 capsule (300 mg total) by mouth 3 (three) times daily. For agitation Patient not taking: Reported on 04/01/2015 10/29/14   Sanjuana Kava, NP  ibuprofen (ADVIL,MOTRIN) 600 MG tablet Take 1 tablet (600 mg total) by mouth every 6 (six) hours as needed. Patient not taking: Reported on 04/01/2015 01/21/15   [name], NP  loratadine (CLARITIN) 10 MG tablet Take 1 tablet (10 mg total) by mouth daily. One po daily x 5 days Patient not taking: Reported on 04/01/2015 01/21/15   [name], NP   naproxen (NAPROSYN) 500 MG tablet Take 1 tablet (500 mg total) by mouth 2 (two) times daily. Patient not taking: Reported on 04/01/2015 01/12/15   Junius Finner, PA-C  traZODone (DESYREL) 50 MG tablet Take 100 mg by mouth at bedtime as needed for sleep.  12/12/14   Historical Provider, MD   BP 131/61 mmHg  Pulse 57  Temp(Src) 98.2 F (36.8 C) (Oral)  Resp 14  SpO2 100% Physical Exam  Constitutional: He appears well-developed and well-nourished. No distress.  HENT:  Head: Normocephalic and atraumatic.  Healing ulcerations to the upper and lower lips. No active infection. No intraoral involvement.   Eyes: Conjunctivae and EOM are normal.  Neck: Neck supple. No tracheal deviation present.  Cardiovascular: Normal rate.   Pulmonary/Chest: Effort normal. No respiratory distress.  Genitourinary:  Healing ulcerations to the penis and scrotum No signs of active infection Scrotum extremely dry with some cracking No active bleeding  Skin: Skin is warm and dry.  Psychiatric: He has a normal mood and affect. His behavior is normal.  Nursing note and vitals reviewed.   ED Course  Procedures   DIAGNOSTIC STUDIES: Oxygen Saturation is 98% on RA, normal by my interpretation.    COORDINATION OF CARE: 3:34 PM Discussed treatment plan with pt at bedside and pt agreed to plan.   Labs Review Labs Reviewed - No data to display  Imaging Review No results found.   EKG Interpretation None      MDM   Final diagnoses:  Herpes   Labs:    Imaging:   Consults:   Therapeutics:   Discharge Meds: Doxycycline  Assessment/Plan: Patient presents with healing herpes infection. No active infection noted, patient does have multiple superficial linear cracks in his skin due to the dryness and previous infection. Patient will be placed on doxycycline to prevent skin infection due to recent skin breakdown. Pt to take Doxycycline and abstain from sexual activity until lesions heal completely. Pt  to bathe twice daily with warm and soapy water. Patient encouraged follow-up with his primary care provider for reevaluation. Return immediately if new or worsening signs of infection present. Patient was not placed on Keflex due to allergy.   I personally performed the services described in this documentation, which was scribed in my presence. The recorded information has been reviewed and is accurate. nt}   Eyvonne Mechanic, PA-C 06/06/15 1752  Azalia Bilis, MD 06/08/15 [phone]

## 2015-06-05 NOTE — ED Notes (Signed)
Pt states that he was seen for a rash to his mouth and genitals. Pt states that he has been told that he has herpes. Pt states that pain has continued after finishing medications that he was prescribed.

## 2015-06-05 NOTE — Discharge Instructions (Signed)
Please use antibiotics as directed, if signs of infection present please return for reevaluation.

## 2015-07-16 ENCOUNTER — Telehealth: Payer: Self-pay | Admitting: *Deleted

## 2015-07-16 ENCOUNTER — Ambulatory Visit: Payer: Federal, State, Local not specified - Other | Admitting: Internal Medicine

## 2015-07-16 NOTE — Telephone Encounter (Signed)
Message left requesting pt call for a new appt. 

## 2015-07-19 ENCOUNTER — Encounter (HOSPITAL_COMMUNITY): Payer: Self-pay | Admitting: Emergency Medicine

## 2015-07-19 ENCOUNTER — Emergency Department (HOSPITAL_COMMUNITY)
Admission: EM | Admit: 2015-07-19 | Discharge: 2015-07-19 | Disposition: A | Discharge status: Home / Self Care | Payer: Self-pay | Attending: Emergency Medicine | Admitting: Emergency Medicine

## 2015-07-19 ENCOUNTER — Emergency Department (HOSPITAL_COMMUNITY): Payer: Self-pay

## 2015-07-19 DIAGNOSIS — Z792 Long term (current) use of antibiotics: Secondary | ICD-10-CM | POA: Insufficient documentation

## 2015-07-19 DIAGNOSIS — Y998 Other external cause status: Secondary | ICD-10-CM | POA: Insufficient documentation

## 2015-07-19 DIAGNOSIS — B20 Human immunodeficiency virus [HIV] disease: Secondary | ICD-10-CM | POA: Insufficient documentation

## 2015-07-19 DIAGNOSIS — F319 Bipolar disorder, unspecified: Secondary | ICD-10-CM | POA: Insufficient documentation

## 2015-07-19 DIAGNOSIS — Z720 Tobacco use: Secondary | ICD-10-CM | POA: Insufficient documentation

## 2015-07-19 DIAGNOSIS — I10 Essential (primary) hypertension: Secondary | ICD-10-CM | POA: Insufficient documentation

## 2015-07-19 DIAGNOSIS — Z880 Allergy status to penicillin: Secondary | ICD-10-CM | POA: Insufficient documentation

## 2015-07-19 DIAGNOSIS — Y9389 Activity, other specified: Secondary | ICD-10-CM | POA: Insufficient documentation

## 2015-07-19 DIAGNOSIS — Z79899 Other long term (current) drug therapy: Secondary | ICD-10-CM | POA: Insufficient documentation

## 2015-07-19 DIAGNOSIS — J45909 Unspecified asthma, uncomplicated: Secondary | ICD-10-CM | POA: Insufficient documentation

## 2015-07-19 DIAGNOSIS — S0993XA Unspecified injury of face, initial encounter: Secondary | ICD-10-CM | POA: Insufficient documentation

## 2015-07-19 DIAGNOSIS — Y9289 Other specified places as the place of occurrence of the external cause: Secondary | ICD-10-CM | POA: Insufficient documentation

## 2015-07-19 MED ORDER — FLUORESCEIN SODIUM 1 MG OP STRP
1.0000 | ORAL_STRIP | Freq: Once | OPHTHALMIC | Status: DC
Start: 2015-07-19 — End: 2015-07-19
  Filled 2015-07-19: qty 1

## 2015-07-19 MED ORDER — OXYCODONE-ACETAMINOPHEN 5-325 MG PO TABS: 1.0000 | ORAL_TABLET | Freq: Four times a day (QID) | ORAL | Status: DC | PRN

## 2015-07-19 MED ORDER — OXYCODONE-ACETAMINOPHEN 5-325 MG PO TABS
1.0000 | ORAL_TABLET | Freq: Once | ORAL | Status: AC
  Administered 2015-07-19: 1 via ORAL
  Filled 2015-07-19: qty 1

## 2015-07-19 MED ORDER — TETRACAINE HCL 0.5 % OP SOLN
1.0000 [drp] | Freq: Once | OPHTHALMIC | Status: DC
  Filled 2015-07-19: qty 2

## 2015-07-19 NOTE — Discharge Instructions (Signed)
Please use medication only as directed and needed for breakthrough pain. Please use Tylenol and ibuprofen. Please apply ice ovary washcloth to the affected area. Please follow-up with primary care provider in 3-5 days for reevaluation. Please follow-up sooner as needed
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
## 2015-07-19 NOTE — ED Notes (Signed)
Patient transported to CT 

## 2015-07-31 ENCOUNTER — Other Ambulatory Visit: Payer: Self-pay | Admitting: Infectious Disease

## 2015-08-05 ENCOUNTER — Ambulatory Visit: Payer: Self-pay | Admitting: Internal Medicine

## 2015-08-29 ENCOUNTER — Other Ambulatory Visit: Payer: Self-pay | Admitting: Internal Medicine

## 2015-08-29 DIAGNOSIS — F3160 Bipolar disorder, current episode mixed, unspecified: Secondary | ICD-10-CM

## 2015-09-11 ENCOUNTER — Ambulatory Visit (INDEPENDENT_AMBULATORY_CARE_PROVIDER_SITE_OTHER): Payer: Self-pay | Admitting: Internal Medicine

## 2015-09-11 ENCOUNTER — Encounter: Payer: Self-pay | Admitting: Internal Medicine

## 2015-09-11 VITALS — BP 124/81 | HR 57 | Temp 97.1°F | Wt 167.0 lb

## 2015-09-11 DIAGNOSIS — J069 Acute upper respiratory infection, unspecified: Secondary | ICD-10-CM

## 2015-09-11 DIAGNOSIS — B20 Human immunodeficiency virus [HIV] disease: Secondary | ICD-10-CM

## 2015-09-11 DIAGNOSIS — Z23 Encounter for immunization: Secondary | ICD-10-CM

## 2015-09-11 LAB — CBC WITH DIFFERENTIAL/PLATELET
Basophils Absolute: 0.1 10*3/uL (ref 0.0–0.1)
Basophils Relative: 1 % (ref 0–1)
EOS PCT: 5 % (ref 0–5)
Eosinophils Absolute: 0.3 10*3/uL (ref 0.0–0.7)
HEMATOCRIT: 42.6 % (ref 39.0–52.0)
Hemoglobin: 14 g/dL (ref 13.0–17.0)
LYMPHS ABS: 3 10*3/uL (ref 0.7–4.0)
LYMPHS PCT: 45 % (ref 12–46)
MCH: 29.6 pg (ref 26.0–34.0)
MCHC: 32.9 g/dL (ref 30.0–36.0)
MCV: 90.1 fL (ref 78.0–100.0)
MONO ABS: 0.7 10*3/uL (ref 0.1–1.0)
MPV: 10 fL (ref 8.6–12.4)
Monocytes Relative: 10 % (ref 3–12)
Neutro Abs: 2.6 10*3/uL (ref 1.7–7.7)
Neutrophils Relative %: 39 % — ABNORMAL LOW (ref 43–77)
Platelets: 251 10*3/uL (ref 150–400)
RBC: 4.73 MIL/uL (ref 4.22–5.81)
RDW: 13.5 % (ref 11.5–15.5)
WBC: 6.6 10*3/uL (ref 4.0–10.5)

## 2015-09-11 LAB — COMPLETE METABOLIC PANEL WITH GFR
ALT: 12 U/L (ref 9–46)
AST: 15 U/L (ref 10–40)
Albumin: 4 g/dL (ref 3.6–5.1)
Alkaline Phosphatase: 43 U/L (ref 40–115)
BUN: 9 mg/dL (ref 7–25)
CALCIUM: 9 mg/dL (ref 8.6–10.3)
CHLORIDE: 107 mmol/L (ref 98–110)
CO2: 27 mmol/L (ref 20–31)
Creat: 0.94 mg/dL (ref 0.60–1.35)
GFR, Est African American: >89 mL/min (ref >=60)
GFR, Est Non African American: >89 mL/min (ref >=60)
Glucose, Bld: 77 mg/dL (ref 65–99)
POTASSIUM: 4.6 mmol/L (ref 3.5–5.3)
Sodium: 140 mmol/L (ref 135–146)
Total Bilirubin: 0.5 mg/dL (ref 0.2–1.2)
Total Protein: 6.4 g/dL (ref 6.1–8.1)

## 2015-09-11 NOTE — Progress Notes (Signed)
Patient ID: Edwin Martinez, male   DOB: 1986-07-08, 29 y.o.   MRN: [id number]       Patient ID: Edwin Martinez, male   DOB: 1986-07-08, 29 y.o.   MRN: [id number]  HPI 29yo M with HIV disease, bipolar disorder, CD 4 count of 1650/VL<20 (may 2016) on triumeq. Has had financial difficulties since losing his food stamps. He is 2 months behind on utilities. Working with [name] for assistance. He has not worked since 2001, mental health issues precluded him from keeping a job consistently. Longest job working at [employer] for 6 months. 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 He is applying for disability.  Ros: running nose but no fever ,chills, cough,myalgias arthralgias  Outpatient Encounter Prescriptions as of 09/11/2015  Medication Sig  . Abacavir-Dolutegravir-Lamivud (TRIUMEQ) 600-50-300 MG TABS Take 1 tablet by mouth daily. For HIV infection  . acyclovir (ZOVIRAX) 400 MG tablet Take 1 tablet (400 mg total) by mouth 3 (three) times daily.  Marland Kitchen. albuterol (PROVENTIL HFA;VENTOLIN HFA) 108 (90 BASE) MCG/ACT inhaler Inhale 2 puffs into the lungs every 6 (six) hours as needed for wheezing or shortness of breath.  . busPIRone (BUSPAR) 10 MG tablet TAKE 2 TABLETS BY MOUTH TWICE DAILY FOR ANXIETY  . divalproex (DEPAKOTE ER) 500 MG 24 hr tablet Take 1 tablet (500 mg total) by mouth 2 (two) times daily. For mood stabilization  . traZODone (DESYREL) 50 MG tablet Take 100 mg by mouth at bedtime as needed for sleep.   . [DISCONTINUED] azithromycin (ZITHROMAX Z-PAK) 250 MG tablet 2 po day one, then 1 daily x 4 days (Patient not taking: Reported on 01/12/2015)  . [DISCONTINUED] chlorpheniramine-HYDROcodone (TUSSIONEX PENNKINETIC ER) 10-8 MG/5ML LQCR Take 5 mLs by mouth every 12 (twelve) hours as needed for cough. (Patient not taking: Reported on 01/12/2015)  . [DISCONTINUED] doxycycline (VIBRAMYCIN) 100 MG capsule Take 1 capsule (100 mg total) by mouth 2 (two) times daily.  . [DISCONTINUED] Emollient (HYDRASYN25)  CREA Apply 1 Dose topically daily as needed. (Patient not taking: Reported on 04/01/2015)  . [DISCONTINUED] gabapentin (NEURONTIN) 300 MG capsule Take 1 capsule (300 mg total) by mouth 3 (three) times daily. For agitation (Patient not taking: Reported on 04/01/2015)  . [DISCONTINUED] ibuprofen (ADVIL,MOTRIN) 600 MG tablet Take 1 tablet (600 mg total) by mouth every 6 (six) hours as needed. (Patient not taking: Reported on 04/01/2015)  . [DISCONTINUED] loratadine (CLARITIN) 10 MG tablet Take 1 tablet (10 mg total) by mouth daily. One po daily x 5 days (Patient not taking: Reported on 04/01/2015)  . [DISCONTINUED] naproxen (NAPROSYN) 500 MG tablet Take 1 tablet (500 mg total) by mouth 2 (two) times daily. (Patient not taking: Reported on 04/01/2015)  . [DISCONTINUED] oxyCODONE-acetaminophen (PERCOCET/ROXICET) 5-325 MG tablet Take 1 tablet by mouth every 6 (six) hours as needed for severe pain.  . [DISCONTINUED] TRIUMEQ 600-50-300 MG TABS TAKE 1 TABLET BY MOUTH EVERY DAY   No facility-administered encounter medications on file as of 09/11/2015.     Patient Active Problem List   Diagnosis Date Noted  . Suicidal ideation 09/14/2014  . Depression   . Suicidal ideations   . Bipolar I disorder, most recent episode depressed (HCC) 05/24/2014  . GAD (generalized anxiety disorder) 05/24/2014  . MDD (major depressive disorder) (HCC) 05/23/2014  . Suicide attempt (HCC) 05/23/2014  . MDD (major depressive disorder), recurrent episode, severe (HCC) 05/23/2014  . Transaminitis 03/20/2014  . ADHD (attention deficit hyperactivity disorder) 09/12/2012  . INSOMNIA, CHRONIC 05/20/2007  . CARPAL  TUNNEL SYNDROME 05/20/2007  . 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 HIV DISEASE 05/05/2007  . 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 TETANUS/TDAP  08/08/2004  . INFLUENZA VACCINE  04/29/2015     Review of Systems Positive pertinents listed in  hpi Physical Exam   BP 124/81 mmHg  Pulse 57  Temp(Src) 97.1 F (36.2 C) (Oral)  Wt 167 lb (75.751 kg) Physical Exam  Constitutional: He is oriented to person, place, and time. He appears well-developed and well-nourished. No distress.  HENT:  Mouth/Throat: Oropharynx is clear and moist. No oropharyngeal exudate.  Cardiovascular: Normal rate, regular rhythm and normal heart sounds. Exam reveals no gallop and no friction rub.  No murmur heard.  Pulmonary/Chest: Effort normal and breath sounds normal. No respiratory distress. He has no wheezes.  Lymphadenopathy:  He has no cervical adenopathy.  Neurological: He is alert and oriented to person, place, and time.  Skin: Skin is warm and dry. No rash noted. No erythema.  Psychiatric: He has a normal mood and affect. His behavior is normal.    Lab Results  Component Value Date   CD4TCELL 42 02/05/2015   Lab Results  Component Value Date   CD4TABS 1630 02/05/2015   CD4TABS 1000 11/06/2014   CD4TABS 1130 07/10/2014   Lab Results  Component Value Date   HIV1RNAQUANT <20 02/05/2015   Lab Results  Component Value Date   HEPBSAB REACTIVE* 09/06/2012   No results found for: RPR  CBC Lab Results  Component Value Date   WBC 7.8 01/12/2015   RBC 4.78 01/12/2015   HGB 14.2 01/12/2015   HCT 42.6 01/12/2015   PLT 250 01/12/2015   MCV 89.1 01/12/2015   MCH 29.7 01/12/2015   MCHC 33.3 01/12/2015   RDW 13.5 01/12/2015   LYMPHSABS 3.0 11/06/2014   MONOABS 0.7 11/06/2014   EOSABS 0.2 11/06/2014   BASOSABS 0.1 11/06/2014   BMET Lab Results  Component Value Date   NA 137 01/12/2015   K 3.5 01/12/2015   CL 103 01/12/2015   CO2 25 01/12/2015   GLUCOSE 139* 01/12/2015   BUN 14 01/12/2015   CREATININE 0.84 01/12/2015   CALCIUM 9.0 01/12/2015   GFRNONAA >90 01/12/2015   GFRAA >90 01/12/2015     Assessment and Plan  hiv disease = will check viral load, continue on current regimen  MDD/GAD = continue on current regimen.  Will provide letter to help with getting food stamps reinstituted  Viral illness = upper respiratory infection, likely viral. recommend with supportive care  Health maintenance =gave flu shot today. We do sti screening at next visit. He has one main partner who is a patient at the clinic/[facility]

## 2015-09-12 LAB — HIV-1 RNA QUANT-NO REFLEX-BLD
HIV 1 RNA Quant: <20 copies/mL (ref <20)
HIV-1 RNA Quant, Log: <1.3 Log copies/mL (ref <1.30)

## 2015-09-13 LAB — T-HELPER CELL (CD4) - (RCID CLINIC ONLY)
CD4 % Helper T Cell: 40 % (ref 33–55)
CD4 T CELL ABS: 1250 /uL (ref 400–2700)

## 2015-09-30 ENCOUNTER — Other Ambulatory Visit: Payer: Self-pay | Admitting: Internal Medicine

## 2015-10-09 ENCOUNTER — Telehealth: Payer: Self-pay | Admitting: *Deleted

## 2015-10-09 DIAGNOSIS — F3130 Bipolar disorder, current episode depressed, mild or moderate severity, unspecified: Secondary | ICD-10-CM

## 2015-10-09 MED ORDER — DIVALPROEX SODIUM ER 500 MG PO TB24: 500.0000 mg | ORAL_TABLET | Freq: Two times a day (BID) | ORAL | Status: DC

## 2015-10-09 NOTE — Telephone Encounter (Signed)
Received faxed refill request for the pt's Depakote rx.  [facility] shared that the pt has been on rx for 6 months, BID from a previous prescription from the ED.

## 2015-11-26 ENCOUNTER — Encounter (HOSPITAL_COMMUNITY): Payer: Self-pay | Admitting: Emergency Medicine

## 2015-11-26 ENCOUNTER — Emergency Department (HOSPITAL_COMMUNITY): Payer: Self-pay

## 2015-11-26 ENCOUNTER — Emergency Department (HOSPITAL_COMMUNITY)
Admission: EM | Admit: 2015-11-26 | Discharge: 2015-11-26 | Disposition: A | Discharge status: Home / Self Care | Payer: Self-pay | Attending: Emergency Medicine | Admitting: Emergency Medicine

## 2015-11-26 DIAGNOSIS — J45901 Unspecified asthma with (acute) exacerbation: Secondary | ICD-10-CM | POA: Insufficient documentation

## 2015-11-26 DIAGNOSIS — Z79899 Other long term (current) drug therapy: Secondary | ICD-10-CM | POA: Insufficient documentation

## 2015-11-26 DIAGNOSIS — F17210 Nicotine dependence, cigarettes, uncomplicated: Secondary | ICD-10-CM | POA: Insufficient documentation

## 2015-11-26 DIAGNOSIS — B20 Human immunodeficiency virus [HIV] disease: Secondary | ICD-10-CM | POA: Insufficient documentation

## 2015-11-26 DIAGNOSIS — B9789 Other viral agents as the cause of diseases classified elsewhere: Secondary | ICD-10-CM

## 2015-11-26 DIAGNOSIS — F319 Bipolar disorder, unspecified: Secondary | ICD-10-CM | POA: Insufficient documentation

## 2015-11-26 DIAGNOSIS — J069 Acute upper respiratory infection, unspecified: Secondary | ICD-10-CM | POA: Insufficient documentation

## 2015-11-26 DIAGNOSIS — Z880 Allergy status to penicillin: Secondary | ICD-10-CM | POA: Insufficient documentation

## 2015-11-26 DIAGNOSIS — I10 Essential (primary) hypertension: Secondary | ICD-10-CM | POA: Insufficient documentation

## 2015-11-26 MED ORDER — IPRATROPIUM-ALBUTEROL 0.5-2.5 (3) MG/3ML IN SOLN
3.0000 mL | Freq: Once | RESPIRATORY_TRACT | Status: AC
  Administered 2015-11-26: 3 mL via RESPIRATORY_TRACT
  Filled 2015-11-26: qty 3

## 2015-11-26 MED ORDER — GUAIFENESIN 100 MG/5ML PO LIQD: 100.0000 mg | ORAL | Status: DC | PRN

## 2015-11-26 MED ORDER — ALBUTEROL SULFATE HFA 108 (90 BASE) MCG/ACT IN AERS
1.0000 | INHALATION_SPRAY | Freq: Once | RESPIRATORY_TRACT | Status: AC
  Administered 2015-11-26: 2 via RESPIRATORY_TRACT
  Filled 2015-11-26: qty 6.7

## 2015-11-26 NOTE — ED Notes (Signed)
Pt reports productive cough x 3 weeks. Has not been seen by PCP. Wheezing noted throughout anterior lobes, inspiratory and expiratory. Diminished in posterior bases. Dry cough noted at present

## 2015-11-26 NOTE — Discharge Instructions (Signed)
Upper Respiratory Infection, Adult Most upper respiratory infections (URIs) are a viral infection of the air passages leading to the lungs. A URI affects the nose, throat, and upper air passages. The most common type of URI is nasopharyngitis and is typically referred to as "the common cold." URIs run their course and usually go away on their own. Most of the time, a URI does not require medical attention, but sometimes a bacterial infection in the upper airways can follow a viral infection. This is called a secondary infection. Sinus and middle ear infections are common types of secondary upper respiratory infections. Bacterial pneumonia can also complicate a URI. A URI can worsen asthma and chronic obstructive pulmonary disease (COPD). Sometimes, these complications can require emergency medical care and may be life threatening.  CAUSES Almost all URIs are caused by viruses. A virus is a type of germ and can spread from one person to another.  RISKS FACTORS You may be at risk for a URI if:   You smoke.   You have chronic heart or lung disease.  You have a weakened defense (immune) system.   You are very young or very old.   You have nasal allergies or asthma.  You work in crowded or poorly ventilated areas.  You work in health care facilities or schools. SIGNS AND SYMPTOMS  Symptoms typically develop 2-3 days after you come in contact with a cold virus. Most viral URIs last 7-10 days. However, viral URIs from the influenza virus (flu virus) can last 14-18 days and are typically more severe. Symptoms may include:   Runny or stuffy (congested) nose.   Sneezing.   Cough.   Sore throat.   Headache.   Fatigue.   Fever.   Loss of appetite.   Pain in your forehead, behind your eyes, and over your cheekbones (sinus pain).  Muscle aches.  DIAGNOSIS  Your health care provider may diagnose a URI by:  Physical exam.  Tests to check that your symptoms are not due to  another condition such as:  Strep throat.  Sinusitis.  Pneumonia.  Asthma. TREATMENT  A URI goes away on its own with time. It cannot be cured with medicines, but medicines may be prescribed or recommended to relieve symptoms. Medicines may help:  Reduce your fever.  Reduce your cough.  Relieve nasal congestion. HOME CARE INSTRUCTIONS   Take medicines only as directed by your health care provider.   Gargle warm saltwater or take cough drops to comfort your throat as directed by your health care provider.  Use a warm mist humidifier or inhale steam from a shower to increase air moisture. This may make it easier to breathe.  Drink enough fluid to keep your urine clear or pale yellow.   Eat soups and other clear broths and maintain good nutrition.   Rest as needed.   Return to work when your temperature has returned to normal or as your health care provider advises. You may need to stay home longer to avoid infecting others. You can also use a face mask and careful hand washing to prevent spread of the virus.  Increase the usage of your inhaler if you have asthma.   Do not use any tobacco products, including cigarettes, chewing tobacco, or electronic cigarettes. If you need help quitting, ask your health care provider. PREVENTION  The best way to protect yourself from getting a cold is to practice good hygiene.   Avoid oral or hand contact with people with cold   symptoms.   Wash your hands often if contact occurs.  There is no clear evidence that vitamin C, vitamin E, echinacea, or exercise reduces the chance of developing a cold. However, it is always recommended to get plenty of rest, exercise, and practice good nutrition.  SEEK MEDICAL CARE IF:   You are getting worse rather than better.   Your symptoms are not controlled by medicine.   You have chills.  You have worsening shortness of breath.  You have brown or red mucus.  You have yellow or brown nasal  discharge.  You have pain in your face, especially when you bend forward.  You have a fever.  You have swollen neck glands.  You have pain while swallowing.  You have white areas in the back of your throat. SEEK IMMEDIATE MEDICAL CARE IF:   You have severe or persistent:  Headache.  Ear pain.  Sinus pain.  Chest pain.  You have chronic lung disease and any of the following:  Wheezing.  Prolonged cough.  Coughing up blood.  A change in your usual mucus.  You have a stiff neck.  You have changes in your:  Vision.  Hearing.  Thinking.  Mood. MAKE SURE YOU:   Understand these instructions.  Will watch your condition.  Will get help right away if you are not doing well or get worse.   This information is not intended to replace advice given to you by your health care provider. Make sure you discuss any questions you have with your health care provider.   Document Released: 03/10/2001 Document Revised: 01/29/2015 Document Reviewed: 12/20/2013 Elsevier Interactive Patient Education 2016 [employer]  

## 2015-11-26 NOTE — ED Provider Notes (Signed)
CSN: [id number]     Arrival date & time 11/26/15  1659 History  By signing my name below, I, Bethel Born, attest that this documentation has been prepared under the direction and in the presence of [employer]. Electronically Signed: Bethel Born, ED Scribe. 11/26/2015 6:18 PM   Chief Complaint  Patient presents with  . Cough    x 3 weeks    The history is provided by the patient. No language interpreter was used.   Edwin Martinez is a 30 y.o. male with history of HIV, asthma, and HTN who presents to the Emergency Department complaining of a persistent dry cough with onset 3 weeks ago. Associated symptoms include nasal congestion, sore throat, and chest congestion. Pt denies chest pain, SOB, and N/V/D. His partner is ill with similar symptoms. He was seen by his infectious disease special last month. No recent travel.   Past Medical History  Diagnosis Date  . HIV (human immunodeficiency virus infection) (HCC)   . Bipolar 1 disorder (HCC)   . Schizophrenia (HCC)   . Asthma   . Hypertension    Past Surgical History  Procedure Laterality Date  . Dental surgery     Family History  Problem Relation Age of Onset  . Huntington's disease Father   . Heart disease Mother    Social History  Substance Use Topics  . Smoking status: Current Every Day Smoker -- 0.50 packs/day    Types: Cigarettes    Start date: 09/29/1991  . Smokeless tobacco: Never Used  . Alcohol Use: 48.0 oz/week    80 Standard drinks or equivalent per week     Comment: once week     Review of Systems  All other systems reviewed and are negative.  Allergies  Atripla; Magnesium-containing compounds; Esomeprazole magnesium; Bactrim; Peanuts; and Penicillins  Home Medications   Prior to Admission medications   Medication Sig Start Date End Date Taking? Authorizing Provider  Abacavir-Dolutegravir-Lamivud (TRIUMEQ) 600-50-300 MG TABS Take 1 tablet by mouth daily. For HIV infection 11/19/14   Gardiner Barefoot, MD  acyclovir (ZOVIRAX) 400 MG tablet Take 1 tablet (400 mg total) by mouth 3 (three) times daily. 05/20/15   Garlon Hatchet, PA-C  albuterol (PROVENTIL HFA;VENTOLIN HFA) 108 (90 BASE) MCG/ACT inhaler Inhale 2 puffs into the lungs every 6 (six) hours as needed for wheezing or shortness of breath. 10/29/14   Sanjuana Kava, NP  busPIRone (BUSPAR) 10 MG tablet TAKE 2 TABLETS BY MOUTH TWICE DAILY FOR ANXIETY 08/29/15   Judyann Munson, MD  divalproex (DEPAKOTE ER) 500 MG 24 hr tablet Take 1 tablet (500 mg total) by mouth 2 (two) times daily. For mood stabilization 10/09/15   Judyann Munson, MD  guaiFENesin (ROBITUSSIN) 100 MG/5ML liquid Take 5-10 mLs (100-200 mg total) by mouth every 4 (four) hours as needed for cough. 11/26/15   Eyvonne Mechanic, PA-C  traZODone (DESYREL) 50 MG tablet Take 100 mg by mouth at bedtime as needed for sleep.  12/12/14   Historical Provider, MD   BP 132/77 mmHg  Pulse 84  Temp(Src) 98.2 F (36.8 C)  Resp 20  Wt 160 lb (72.576 kg)  SpO2 99% Physical Exam  Constitutional: He is oriented to person, place, and time. He appears well-developed and well-nourished. No distress.  HENT:  Head: Normocephalic and atraumatic.  Nose: No rhinorrhea.  Eyes: Conjunctivae and EOM are normal.  Neck: Neck supple. No tracheal deviation present.  Cardiovascular: Normal rate and regular rhythm.   Pulmonary/Chest:  Effort normal. No respiratory distress. He has wheezes.  Minor wheeze in the bilateral upper lobes   Abdominal: Soft. There is no tenderness.  Musculoskeletal: Normal range of motion.  Neurological: He is alert and oriented to person, place, and time.  Skin: Skin is warm and dry.  Psychiatric: He has a normal mood and affect. His behavior is normal.  Nursing note and vitals reviewed.   ED Course  Procedures (including critical care time) DIAGNOSTIC STUDIES: Oxygen Saturation is 99% on RA,  normal by my interpretation.    COORDINATION OF CARE: 6:04 PM Discussed  treatment plan which includes CXR and a breathing treatment with pt at bedside and pt agreed to plan.  Labs Review Labs Reviewed - No data to display  Imaging Review Dg Chest 2 View  11/26/2015  CLINICAL DATA:  Shortness of breath with cough for 2 weeks. Four day history of chest pressure sensation. HIV disease. EXAM: CHEST  2 VIEW COMPARISON:  January 12, 2015 FINDINGS: Lungs are clear. Heart size and pulmonary vascularity are normal. No adenopathy. No pneumothorax. No bone lesions. IMPRESSION: No edema or consolidation. Electronically Signed   By: Bretta Bang III M.D.   On: 11/26/2015 18:41      EKG Interpretation None      MDM   Final diagnoses:  Viral URI with cough    Labs:  Imaging: CXR  Consults:  Therapeutics: DUONEB and albuterol   Discharge Meds:   Assessment/Plan: 30 year old male presents with likely viral URI. Patient is HIV positive, most recent evaluation by infectious disease shows normal CD4 count and and Sectral viral load on 09/11/2015. Patient is afebrile, nontoxic in no acute respiratory distress. Patient had minor wheeze on exam, he was given a breathing treatment here which significantly improved his symptoms. X-ray showsno acute findings for infectious etiology. Patient will be given a prescription of albuterol here in the ED as he has had financial difficulties affording medications. Patient will be instructed follow-up with his primary care, return to ED if any new or worsening signs or symptoms present. Patient verbalized understanding and agreement for today's plan and had no further questions or concerns at time of discharge    I personally performed the services described in this documentation, which was scribed in my presence. The recorded information has been reviewed and is accurate.   Eyvonne Mechanic, PA-C 11/26/15 1850  Arby Barrette, MD 12/08/15 1451

## 2015-11-26 NOTE — ED Notes (Signed)
Nausea, vomiting, diarrhea, x 3 days per EMS, cough on-going x 2 weeks.

## 2015-11-30 ENCOUNTER — Other Ambulatory Visit: Payer: Self-pay | Admitting: Internal Medicine

## 2015-12-10 ENCOUNTER — Ambulatory Visit: Payer: Self-pay | Admitting: Internal Medicine

## 2015-12-20 ENCOUNTER — Encounter (HOSPITAL_COMMUNITY): Payer: Self-pay

## 2015-12-20 ENCOUNTER — Emergency Department (HOSPITAL_COMMUNITY)
Admission: EM | Admit: 2015-12-20 | Discharge: 2015-12-21 | Disposition: A | Discharge status: Other Acute Inpatient Hospital | Payer: Federal, State, Local not specified - Other | Attending: Emergency Medicine | Admitting: Emergency Medicine

## 2015-12-20 DIAGNOSIS — F3130 Bipolar disorder, current episode depressed, mild or moderate severity, unspecified: Secondary | ICD-10-CM | POA: Diagnosis not present

## 2015-12-20 DIAGNOSIS — J45909 Unspecified asthma, uncomplicated: Secondary | ICD-10-CM | POA: Insufficient documentation

## 2015-12-20 DIAGNOSIS — Z79899 Other long term (current) drug therapy: Secondary | ICD-10-CM | POA: Insufficient documentation

## 2015-12-20 DIAGNOSIS — F919 Conduct disorder, unspecified: Secondary | ICD-10-CM | POA: Insufficient documentation

## 2015-12-20 DIAGNOSIS — F1410 Cocaine abuse, uncomplicated: Secondary | ICD-10-CM | POA: Insufficient documentation

## 2015-12-20 DIAGNOSIS — F1210 Cannabis abuse, uncomplicated: Secondary | ICD-10-CM | POA: Insufficient documentation

## 2015-12-20 DIAGNOSIS — B20 Human immunodeficiency virus [HIV] disease: Secondary | ICD-10-CM | POA: Insufficient documentation

## 2015-12-20 DIAGNOSIS — I10 Essential (primary) hypertension: Secondary | ICD-10-CM | POA: Insufficient documentation

## 2015-12-20 DIAGNOSIS — R45851 Suicidal ideations: Secondary | ICD-10-CM

## 2015-12-20 DIAGNOSIS — Z880 Allergy status to penicillin: Secondary | ICD-10-CM | POA: Insufficient documentation

## 2015-12-20 DIAGNOSIS — F17210 Nicotine dependence, cigarettes, uncomplicated: Secondary | ICD-10-CM | POA: Insufficient documentation

## 2015-12-20 LAB — COMPREHENSIVE METABOLIC PANEL
ALBUMIN: 4.4 g/dL (ref 3.5–5.0)
ALK PHOS: 65 U/L (ref 38–126)
ALT: 22 U/L (ref 17–63)
ANION GAP: 11 (ref 5–15)
AST: 22 U/L (ref 15–41)
BUN: 9 mg/dL (ref 6–20)
CHLORIDE: 105 mmol/L (ref 101–111)
CO2: 24 mmol/L (ref 22–32)
Calcium: 9.8 mg/dL (ref 8.9–10.3)
Creatinine, Ser: 0.92 mg/dL (ref 0.61–1.24)
GFR calc non Af Amer: >60 mL/min (ref >60)
GLUCOSE: 96 mg/dL (ref 65–99)
POTASSIUM: 3.4 mmol/L — AB (ref 3.5–5.1)
SODIUM: 140 mmol/L (ref 135–145)
Total Bilirubin: 0.5 mg/dL (ref 0.3–1.2)
Total Protein: 8 g/dL (ref 6.5–8.1)

## 2015-12-20 LAB — CBC
HEMATOCRIT: 42.5 % (ref 39.0–52.0)
HEMOGLOBIN: 14.1 g/dL (ref 13.0–17.0)
MCH: 29.3 pg (ref 26.0–34.0)
MCHC: 33.2 g/dL (ref 30.0–36.0)
MCV: 88.2 fL (ref 78.0–100.0)
Platelets: 321 10*3/uL (ref 150–400)
RBC: 4.82 MIL/uL (ref 4.22–5.81)
RDW: 13.5 % (ref 11.5–15.5)
WBC: 10.6 10*3/uL — AB (ref 4.0–10.5)

## 2015-12-20 LAB — RAPID URINE DRUG SCREEN, HOSP PERFORMED
AMPHETAMINES: NOT DETECTED
BARBITURATES: NOT DETECTED
BENZODIAZEPINES: NOT DETECTED
COCAINE: POSITIVE — AB
OPIATES: NOT DETECTED
TETRAHYDROCANNABINOL: POSITIVE — AB

## 2015-12-20 LAB — ETHANOL: Alcohol, Ethyl (B): 86 mg/dL — ABNORMAL HIGH (ref <5)

## 2015-12-20 LAB — SALICYLATE LEVEL

## 2015-12-20 LAB — ACETAMINOPHEN LEVEL

## 2015-12-20 MED ORDER — GABAPENTIN 300 MG PO CAPS
300.0000 mg | ORAL_CAPSULE | Freq: Three times a day (TID) | ORAL | Status: DC
  Administered 2015-12-20 (×3): 300 mg via ORAL
  Filled 2015-12-20 (×3): qty 1

## 2015-12-20 MED ORDER — TRAZODONE HCL 100 MG PO TABS: 100.0000 mg | ORAL_TABLET | Freq: Every evening | ORAL | Status: DC | PRN

## 2015-12-20 MED ORDER — NICOTINE 21 MG/24HR TD PT24
21.0000 mg | MEDICATED_PATCH | Freq: Every day | TRANSDERMAL | Status: DC
  Filled 2015-12-20: qty 1

## 2015-12-20 MED ORDER — ONDANSETRON HCL 4 MG PO TABS: 4.0000 mg | ORAL_TABLET | Freq: Three times a day (TID) | ORAL | Status: DC | PRN

## 2015-12-20 MED ORDER — ABACAVIR-DOLUTEGRAVIR-LAMIVUD 600-50-300 MG PO TABS
1.0000 | ORAL_TABLET | Freq: Every day | ORAL | Status: DC
  Administered 2015-12-20: 1 via ORAL
  Filled 2015-12-20: qty 1

## 2015-12-20 MED ORDER — LORAZEPAM 1 MG PO TABS: 1.0000 mg | ORAL_TABLET | Freq: Three times a day (TID) | ORAL | Status: DC | PRN

## 2015-12-20 MED ORDER — ZOLPIDEM TARTRATE 5 MG PO TABS: 5.0000 mg | ORAL_TABLET | Freq: Every evening | ORAL | Status: DC | PRN

## 2015-12-20 MED ORDER — ACETAMINOPHEN 500 MG PO TABS: 1000.0000 mg | ORAL_TABLET | Freq: Four times a day (QID) | ORAL | Status: DC | PRN

## 2015-12-20 MED ORDER — HYDROXYZINE HCL 25 MG PO TABS
25.0000 mg | ORAL_TABLET | Freq: Three times a day (TID) | ORAL | Status: DC | PRN
  Administered 2015-12-20: 25 mg via ORAL
  Filled 2015-12-20: qty 1

## 2015-12-20 MED ORDER — DIVALPROEX SODIUM ER 500 MG PO TB24
500.0000 mg | ORAL_TABLET | Freq: Two times a day (BID) | ORAL | Status: DC
  Administered 2015-12-20 (×2): 500 mg via ORAL
  Filled 2015-12-20 (×2): qty 1

## 2015-12-20 MED ORDER — ALBUTEROL SULFATE HFA 108 (90 BASE) MCG/ACT IN AERS: 2.0000 | INHALATION_SPRAY | Freq: Four times a day (QID) | RESPIRATORY_TRACT | Status: DC | PRN

## 2015-12-20 MED ORDER — BUSPIRONE HCL 10 MG PO TABS
20.0000 mg | ORAL_TABLET | Freq: Two times a day (BID) | ORAL | Status: DC
  Administered 2015-12-20 (×2): 20 mg via ORAL
  Filled 2015-12-20 (×2): qty 2

## 2015-12-20 NOTE — ED Notes (Signed)
Patient noted sleeping in room. No complaints, stable, in no acute distress. Q15 minute rounds and monitoring via Security Cameras to continue.  

## 2015-12-20 NOTE — BH Assessment (Signed)
[facility] Assessment Progress Note  Per Thedore MinsMojeed Akintayo, MD, this pt [facility].  Rosey BathKelly Southard, RN, [facility] has assigned pt to Obs 5 after the Observation Unit opens at 19:00.  Pt has signed Voluntary Admission and Consent for Treatment, as well as Consent to Release Information to no one, and signed forms have [facility].  Pt's nurse has been notified, and agrees to send original paperwork along with pt via [facility] [place], and to call report to [phone] or [phone].  Doylene Canninghomas Lallie Strahm, MA Triage Specialist [phone]

## 2015-12-20 NOTE — ED Notes (Signed)
Patient noted in room. No complaints, stable, in no acute distress. Q15 minute rounds and monitoring via Security Cameras to continue.  

## 2015-12-20 NOTE — BH Assessment (Signed)
Assessment completed. Consulted Alberteen SamFran Hobson, NP who recommended inpatient treatment. TTS to seek placement. Informed PA-C of recommendation.

## 2015-12-20 NOTE — ED Notes (Signed)
Pt here voluntarily with suicidal thoughts and racing thoughts, he says that he's been out of his anxiety medications and some others. He didn't feel safe at home.

## 2015-12-20 NOTE — ED Notes (Signed)
Report from RN. Patient sleeping, respirations regular and unlabored. Q15 minute rounds and security camera observation to continue.   

## 2015-12-20 NOTE — ED Provider Notes (Signed)
CSN: [id number]     Arrival date & time 12/20/15  0350 History   First MD Initiated Contact with Patient 12/20/15 0402     Chief Complaint  Patient presents with  . Suicidal     (Consider location/radiation/quality/duration/timing/severity/associated sxs/prior Treatment) HPI Comments: 30 year old male with a history of HIV, bipolar 1 disorder, schizophrenia, asthma, and hypertension presents to the emergency department for evaluation of suicidal ideations. Patient reports suicidal thoughts for 2-3 days with associated racing thoughts. He denies any suicidal plan. He does have a history of suicide attempt by overdose last year. Patient states that he has been out of his anxiety medication for the past month and could not get his medications refilled. He has been drinking alcohol this evening and reports recent cocaine use. Patient denies any homicidal ideations. He is calm and cooperative.  The history is provided by the patient. No language interpreter was used.    Past Medical History  Diagnosis Date  . HIV (human immunodeficiency virus infection) (HCC)   . Bipolar 1 disorder (HCC)   . Schizophrenia (HCC)   . Asthma   . Hypertension    Past Surgical History  Procedure Laterality Date  . Dental surgery     Family History  Problem Relation Age of Onset  . Huntington's disease Father   . Heart disease Mother    Social History  Substance Use Topics  . Smoking status: Current Every Day Smoker -- 0.50 packs/day    Types: Cigarettes    Start date: 09/29/1991  . Smokeless tobacco: Never Used  . Alcohol Use: 48.0 oz/week    80 Standard drinks or equivalent per week     Comment: once week     Review of Systems  Psychiatric/Behavioral: Positive for suicidal ideas and behavioral problems.  All other systems reviewed and are negative.   Allergies  Atripla; Magnesium-containing compounds; Esomeprazole magnesium; Bactrim; Peanuts; and Penicillins  Home Medications   Prior to  Admission medications   Medication Sig Start Date End Date Taking? Authorizing Provider  Abacavir-Dolutegravir-Lamivud (TRIUMEQ) 600-50-300 MG TABS Take 1 tablet by mouth daily. For HIV infection 11/19/14  Yes Gardiner Barefootobert W Comer, MD  acetaminophen (TYLENOL) 500 MG tablet Take 1,000 mg by mouth every 6 (six) hours as needed for mild pain.   Yes Historical Provider, MD  albuterol (PROVENTIL HFA;VENTOLIN HFA) 108 (90 BASE) MCG/ACT inhaler Inhale 2 puffs into the lungs every 6 (six) hours as needed for wheezing or shortness of breath. 10/29/14  Yes Sanjuana KavaAgnes I Nwoko, NP  busPIRone (BUSPAR) 10 MG tablet TAKE 2 TABLETS BY MOUTH TWICE DAILY FOR ANXIETY 12/02/15  Yes Judyann Munsonynthia Snider, MD  divalproex (DEPAKOTE ER) 500 MG 24 hr tablet Take 1 tablet (500 mg total) by mouth 2 (two) times daily. For mood stabilization 10/09/15  Yes Judyann Munsonynthia Snider, MD  gabapentin (NEURONTIN) 300 MG capsule Take 300 mg by mouth 3 (three) times daily.   Yes Historical Provider, MD  traZODone (DESYREL) 50 MG tablet Take 100 mg by mouth at bedtime as needed for sleep.  12/12/14  Yes Historical Provider, MD  acyclovir (ZOVIRAX) 400 MG tablet Take 1 tablet (400 mg total) by mouth 3 (three) times daily. Patient not taking: Reported on 12/20/2015 05/20/15   Garlon HatchetLisa M Sanders, PA-C  guaiFENesin (ROBITUSSIN) 100 MG/5ML liquid Take 5-10 mLs (100-200 mg total) by mouth every 4 (four) hours as needed for cough. Patient not taking: Reported on 12/20/2015 11/26/15   Eyvonne MechanicJeffrey Hedges, PA-C   BP 136/78 mmHg  Pulse 84  Temp(Src) 98.3 F (36.8 C) (Oral)  Resp 18  SpO2 100%   Physical Exam  Constitutional: He is oriented to person, place, and time. He appears well-developed and well-nourished. No distress.  Nontoxic/nonseptic appearing  HENT:  Head: Normocephalic and atraumatic.  Eyes: Conjunctivae and EOM are normal. No scleral icterus.  Neck: Normal range of motion.  Pulmonary/Chest: Effort normal. No respiratory distress.  Musculoskeletal: Normal range of  motion.  Neurological: He is alert and oriented to person, place, and time. He exhibits normal muscle tone. Coordination normal.  Skin: Skin is warm and dry. No rash noted. He is not diaphoretic. No erythema. No pallor.  Psychiatric: His speech is normal. He is withdrawn. He exhibits a depressed mood. He expresses suicidal ideation. He expresses no suicidal plans.  Nursing note and vitals reviewed.   ED Course  Procedures (including critical care time) Labs Review Labs Reviewed  COMPREHENSIVE METABOLIC PANEL - Abnormal; Notable for the following:    Potassium 3.4 (*)    All other components within normal limits  ETHANOL - Abnormal; Notable for the following:    Alcohol, Ethyl (B) 86 (*)    All other components within normal limits  ACETAMINOPHEN LEVEL - Abnormal; Notable for the following:    Acetaminophen (Tylenol), Serum <10 (*)    All other components within normal limits  CBC - Abnormal; Notable for the following:    WBC 10.6 (*)    All other components within normal limits  URINE RAPID DRUG SCREEN, HOSP PERFORMED - Abnormal; Notable for the following:    Cocaine POSITIVE (*)    Tetrahydrocannabinol POSITIVE (*)    All other components within normal limits  SALICYLATE LEVEL    Imaging Review No results found.   I have personally reviewed and evaluated these images and lab results as part of my medical decision-making.   EKG Interpretation None      MDM   Final diagnoses:  Suicidal ideation  Depression    Patient medically cleared and pending TTS evaluation to determine further care and disposition. Disposition to be determined by oncoming ED provider.   Filed Vitals:   12/20/15 0357  BP: 136/78  Pulse: 84  Temp: 98.3 F (36.8 C)  TempSrc: Oral  Resp: 18  SpO2: 100%     Antony Madura, PA-C 12/20/15 1610  April Palumbo, MD 12/20/15 [phone]

## 2015-12-20 NOTE — ED Notes (Signed)
Patient noted in room. No complaints, stable, in no acute distress. Q15 minute rounds and monitoring via Security Cameras to continue. Snack and beverage given. 

## 2015-12-20 NOTE — Progress Notes (Signed)
[facility]. Services of Dr. Lucianne MussKumar, MD.  Bed expected later today. Rosey BathKelly Kendric Sindelar, RN

## 2015-12-20 NOTE — BH Assessment (Addendum)
Tele Assessment Note   EdConsuello Clossward A Kerrigan is an 30 y.o. male presenting to [facility] reporting suicidal ideations without a plan. Pt reported that he has depression and stated "it's the anniversary of my mother's death". "I ran out of refills". "I am chemically, mentally, emotionally and physically imbalanced". Pt reported attempts in the past and shared that he has overdose and used a drop cord hanging from a ceiling fan in the past during suicide attempts. Pt reported a family history of suicide and shared that his maternal grandmother and uncle both committed suicide. Pt did not report any current mental health treatment but shared that he has had multiple psychiatric hospitalizations in the past. Pt is endorsing multiple depressive symptoms and shared that his sleep has been poor. Pt has an upcoming court date on April 28th due to a "shoplifting charge". Pt reported that he smokes crack, marijuana and drink alcohol. Pt did not report any physical, sexual or emotional abuse at this time Inpatient treatment is recommended.   Diagnosis: Bipolar   Past Medical History:  Past Medical History  Diagnosis Date  . HIV (human immunodeficiency virus infection) (HCC)   . Bipolar 1 disorder (HCC)   . Schizophrenia (HCC)   . Asthma   . Hypertension     Past Surgical History  Procedure Laterality Date  . Dental surgery      Family History:  Family History  Problem Relation Age of Onset  . Huntington's disease Father   . Heart disease Mother     Social History:  reports that he has been smoking Cigarettes.  He started smoking about 24 years ago. He has been smoking about 0.50 packs per day. He has never used smokeless tobacco. He reports that he drinks about 48.0 oz of alcohol per week. He reports that he does not use illicit drugs.  Additional Social History:  Alcohol / Drug Use History of alcohol / drug use?: Yes Longest period of sobriety (when/how long): 2 weeks  Substance #1 Name of Substance  1: Crack  1 - Age of First Use: 15 1 - Amount (size/oz): $20 1 - Frequency:  1-2x monthly  1 - Duration: ongoing  1 - Last Use / Amount: 12-19-15 Substance #2 Name of Substance 2: THC  2 - Age of First Use: 17  2 - Amount (size/oz): 1 joint  2 - Frequency: 2-3x monthly  2 - Duration: ongoing  2 - Last Use / Amount: 12-19-15 Substance #3 Name of Substance 3: Alcohol  3 - Age of First Use: 17 3 - Amount (size/oz): 1-40oz  3 - Frequency: 3-4x weekly  3 - Duration: ongoing  3 - Last Use / Amount: 12-19-15  CIWA: CIWA-Ar BP: 136/78 mmHg Pulse Rate: 84 COWS:    PATIENT STRENGTHS: (choose at least two) Average or above average intelligence Communication skills Motivation for treatment/growth  Allergies:  Allergies  Allergen Reactions  . Atripla [Efavirenz-Emtricitab-Tenofovir] Other (See Comments)    DEPRESSION AND SUICIDE ATTEMPT  . Magnesium-Containing Compounds Other (See Comments)    MAGNESIUM ,COMPOUNDS CAN REDUCE LEVELS OF DOLUTEGRAVIR SO SHOULD NOT BE COADMIN WITH TIVICAY OR TRIUMEQ  . Esomeprazole Magnesium Cough  . Bactrim [Sulfamethoxazole-Trimethoprim] Rash    Patient reported  . Peanuts [Peanut Oil] Rash  . Penicillins Rash    Home Medications:  (Not in a hospital admission)  OB/GYN Status:  No LMP for male patient.  General Assessment Data Location of Assessment: [facility] TTS Assessment: In system Is this a Tele or  Face-to-Face Assessment?: Face-to-Face Is this an Initial Assessment or a Re-assessment for this encounter?: Initial Assessment Marital status: Long term relationship Living Arrangements: Alone Can pt return to current living arrangement?: Yes Admission Status: Voluntary Is patient capable of signing voluntary admission?: Yes Referral Source: Self/Family/Friend     Crisis Care Plan Living Arrangements: Alone Name of Psychiatrist: No provider reported.  Name of Therapist: No provider reported.   Education Status Is patient currently in  school?: No Current Grade: N/A Highest grade of school patient has completed: 8 Name of school: N/A Contact person: N/A  Risk to self with the past 6 months Suicidal Ideation: Yes-Currently Present Has patient been a risk to self within the past 6 months prior to admission? : No Suicidal Intent: No Has patient had any suicidal intent within the past 6 months prior to admission? : No Is patient at risk for suicide?: Yes Suicidal Plan?: No Has patient had any suicidal plan within the past 6 months prior to admission? : No Access to Means: No What has been your use of drugs/alcohol within the last 12 months?: Crack, THC and alcohol use reported.  Previous Attempts/Gestures: Yes How many times?: 2 Other Self Harm Risks: Pt denies Triggers for Past Attempts: Anniversary, Unpredictable Intentional Self Injurious Behavior: None Family Suicide History: Yes (Maternal grandmother, maternal uncle ) Recent stressful life event(s): Other (Comment) (Anniversary of mother's death ) Persecutory voices/beliefs?: No Depression: Yes Depression Symptoms: Despondent, Insomnia, Tearfulness, Guilt, Feeling worthless/self pity, Feeling angry/irritable Substance abuse history and/or treatment for substance abuse?: Yes Suicide prevention information given to non-admitted patients: Not applicable  Risk to Others within the past 6 months Homicidal Ideation: No Does patient have any lifetime risk of violence toward others beyond the six months prior to admission? : No Thoughts of Harm to Others: No Current Homicidal Intent: No Current Homicidal Plan: No Access to Homicidal Means: No Identified Victim: N/A History of harm to others?: No Assessment of Violence: None Noted Violent Behavior Description: No violent behaviors observed. Pt is calm and cooperative at this time.  Does patient have access to weapons?: No Criminal Charges Pending?: Yes Describe Pending Criminal Charges: Shoplifting  Does patient  have a court date: Yes Court Date: 12/24/15 Is patient on probation?: No  Psychosis Hallucinations: None noted Delusions: None noted  Mental Status Report Appearance/Hygiene: In scrubs Eye Contact: Good Motor Activity: Freedom of movement Speech: Logical/coherent Level of Consciousness: Quiet/awake Mood: Depressed Affect: Appropriate to circumstance Anxiety Level: Minimal Thought Processes: Coherent, Relevant Judgement: Unimpaired Orientation: Person, Place, Time, Situation, Appropriate for developmental age Obsessive Compulsive Thoughts/Behaviors: None  Cognitive Functioning Concentration: Normal Memory: Recent Intact, Remote Intact IQ: Average Insight: Good Impulse Control: Good Appetite: Good Weight Loss: 0 Weight Gain: 0 Sleep: Decreased Total Hours of Sleep: 3 Vegetative Symptoms: Staying in bed  ADLScreening [facility] Assessment Services) Patient's cognitive ability adequate to safely complete daily activities?: Yes Patient able to express need for assistance with ADLs?: Yes Independently performs ADLs?: Yes (appropriate for developmental age)  Prior Inpatient Therapy Prior Inpatient Therapy: Yes Prior Therapy Dates: 2015, 2016 Prior Therapy Facilty/Provider(s): [facility] Reason for Treatment: Depression   Prior Outpatient Therapy Prior Outpatient Therapy: No Does patient have an ACCT team?: No Does patient have Intensive In-House Services?  : No Does patient have [name] services? : No Does patient have P4CC services?: No  ADL Screening (condition at time of admission) Patient's cognitive ability adequate to safely complete daily activities?: Yes Is the patient deaf or have difficulty hearing?:  No Does the patient have difficulty seeing, even when wearing glasses/contacts?: No Does the patient have difficulty concentrating, remembering, or making decisions?: No Patient able to express need for assistance with ADLs?: Yes Does the patient have difficulty  dressing or bathing?: No Independently performs ADLs?: Yes (appropriate for developmental age)       Abuse/Neglect Assessment (Assessment to be complete while patient is alone) Physical Abuse: Denies Verbal Abuse: Denies Sexual Abuse: Denies Exploitation of patient/patient's resources: Denies Self-Neglect: Denies     Merchant navy officer (For Healthcare) Does patient have an advance directive?: No Would patient like information on creating an advanced directive?: No - patient declined information    Additional Information 1:1 In Past 12 Months?: No CIRT Risk: No Elopement Risk: No Does patient have medical clearance?: Yes     Disposition:  Disposition Initial Assessment Completed for this Encounter: Yes Disposition of Patient: Inpatient treatment program Type of inpatient treatment program: Adult  Rinoa Garramone S 12/20/2015 6:39 AM

## 2015-12-20 NOTE — ED Notes (Signed)
Pt has in belonging bag:  Brown pants, grey socks, black shoes, red strip shirt, blue underwear, blue jacket, blue tote bag ( 2 black phones, 2 [place] ID card, pill bottle-Divalprex, black charger).

## 2015-12-20 NOTE — H&P (Signed)
Psychiatric BHH-Observation Assessment Adult  Patient Identification: Edwin Martinez MRN:  [id number] Date of Evaluation:  12/20/2015 Chief Complaint:  Patient states "I have been depressed and suicidal for two days."  Principal Diagnosis: Bipolar I disorder, most recent episode depressed ([place]) Diagnosis:   Patient Active Problem List   Diagnosis Date Noted  . Suicidal ideation [R45.851] 09/14/2014  . Depression [F32.9]   . Suicidal ideations [R45.851]   . Bipolar I disorder, most recent episode depressed ([place]) [F31.30] 05/24/2014  . GAD (generalized anxiety disorder) [F41.1] 05/24/2014  . MDD (major depressive disorder) ([place]) [F32.9] 05/23/2014  . Suicide attempt ([place]) [T14.91] 05/23/2014  . MDD (major depressive disorder), recurrent episode, severe ([place]) [F33.2] 05/23/2014  . Transaminitis [R74.0] 03/20/2014  . ADHD (attention deficit hyperactivity disorder) [F90.9] 09/12/2012  . INSOMNIA, CHRONIC [G47.00] 05/20/2007  . CARPAL TUNNEL SYNDROME [G56.00] 05/20/2007  . 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 HIV DISEASE [B20] 05/05/2007  . 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 male presenting to [place] reporting suicidal ideations without a plan. Patient reported increased depressive symptoms due the anniversary of his mother's death from 12-20-08. He reported additional stressor of running out of his medications last year due to having transportation problems. Patient states "I still feel unsafe. But I feel the medicines starting to work because my anxiety is better. I did try to overdose last year and am afraid of myself. I still feel suicidal. I have housing when I leave so the biggest thing is needing my medications back. I generally do pretty well when I am on them. I might have been able to deal with my mother's anniversary better. I also keep her ashes where I can see them so I'm  thinking perhaps it is time to move them to another location." His urine drug screen was positive for cocaine and marijuana. The alcohol level on admission was 86. His current vitals signs are stable with no present of evidence of withdrawal.  The patient's current mood is depressed but he reports a history of past manic episodes.    Associated Signs/Symptoms: Depression Symptoms:  depressed mood, anhedonia, psychomotor retardation, fatigue, feelings of worthlessness/guilt, difficulty concentrating, recurrent thoughts of death, suicidal thoughts with specific plan, anxiety, loss of energy/fatigue, (Hypo) Manic Symptoms:  Denies Anxiety Symptoms:  Excessive Worry, Psychotic Symptoms:  Denies PTSD Symptoms: Negative Total Time spent with patient: 45 minutes  Past Psychiatric History: Bipolar Disorder   Is the patient at risk to self? Yes.    Has the patient been a risk to self in the past 6 months? Yes.    Has the patient been a risk to self within the distant past? Yes.    Is the patient a risk to others? No.  Has the patient been a risk to others in the past 6 months? No.  Has the patient been a risk to others within the distant past? No.   Prior Inpatient Therapy: Prior Inpatient Therapy: Yes Prior Therapy Dates: 2015, 2016 Prior Therapy Facilty/Provider(s): [facility] Reason for Treatment: Depression  Prior Outpatient Therapy: Prior Outpatient Therapy: No Does patient have an ACCT team?: No Does patient have Intensive In-House Services?  : No Does patient have [name] services? : No Does patient have P4CC services?: No  Alcohol Screening:   Substance Abuse History in the last 12 months:  Yes.   Consequences of Substance Abuse: Positive for cocaine, which  could have worsened his depression Previous Psychotropic Medications: Yes  Psychological Evaluations: No  Past Medical History:  Past Medical History  Diagnosis Date  . HIV (human immunodeficiency virus infection)  ([place])   . Bipolar 1 disorder ([place])   . Schizophrenia ([place])   . Asthma   . Hypertension     Past Surgical History  Procedure Laterality Date  . Dental surgery     Family History:  Family History  Problem Relation Age of Onset  . Huntington's disease Father   . Heart disease Mother    Tobacco Screening: Negative  Social History:  History  Alcohol Use  . 48.0 oz/week  . 80 Standard drinks or equivalent per week    Comment: once week      History  Drug Use No    Comment: every other day     Additional Social History: Marital status: Long term relationship    History of alcohol / drug use?: Yes Longest period of sobriety (when/how long): 2 weeks  Name of Substance 1: Crack  1 - Age of First Use: 15 1 - Amount (size/oz): $20 1 - Frequency:  1-2x monthly  1 - Duration: ongoing  1 - Last Use / Amount: 12-19-15 Name of Substance 2: THC  2 - Age of First Use: 17  2 - Amount (size/oz): 1 joint  2 - Frequency: 2-3x monthly  2 - Duration: ongoing  2 - Last Use / Amount: 12-19-15 Name of Substance 3: Alcohol  3 - Age of First Use: 17 3 - Amount (size/oz): 1-40oz  3 - Frequency: 3-4x weekly  3 - Duration: ongoing  3 - Last Use / Amount: 12-19-15              Allergies:   Allergies  Allergen Reactions  . Atripla [Efavirenz-Emtricitab-Tenofovir] Other (See Comments)    DEPRESSION AND SUICIDE ATTEMPT  . Magnesium-Containing Compounds Other (See Comments)    MAGNESIUM ,COMPOUNDS CAN REDUCE LEVELS OF DOLUTEGRAVIR SO SHOULD NOT BE COADMIN WITH TIVICAY OR TRIUMEQ  . Esomeprazole Magnesium Cough  . Bactrim [Sulfamethoxazole-Trimethoprim] Rash    Patient reported  . Peanuts [Peanut Oil] Rash  . Penicillins Rash   Lab Results:  Results for orders placed or performed during the hospital encounter of 12/20/15 (from the past 48 hour(s))  Comprehensive metabolic panel     Status: Abnormal   Collection Time: 12/20/15  4:01 AM  Result Value Ref Range   Sodium 140 135 - 145  mmol/L   Potassium 3.4 (L) 3.5 - 5.1 mmol/L   Chloride 105 101 - 111 mmol/L   CO2 24 22 - 32 mmol/L   Glucose, Bld 96 65 - 99 mg/dL   BUN 9 6 - 20 mg/dL   Creatinine, Ser 0.92 0.61 - 1.24 mg/dL   Calcium 9.8 8.9 - 10.3 mg/dL   Total Protein 8.0 6.5 - 8.1 g/dL   Albumin 4.4 3.5 - 5.0 g/dL   AST 22 15 - 41 U/L   ALT 22 17 - 63 U/L   Alkaline Phosphatase 65 38 - 126 U/L   Total Bilirubin 0.5 0.3 - 1.2 mg/dL   GFR calc non Af Amer >60 >60 mL/min   GFR calc Af Amer >60 >60 mL/min    Comment: (NOTE) The eGFR has been calculated using the CKD EPI equation. This calculation has not been validated in all clinical situations. eGFR's persistently <60 mL/min signify possible Chronic Kidney Disease.    Anion gap 11 5 - 15  Ethanol (ETOH)     Status: Abnormal   Collection Time: 12/20/15  4:01 AM  Result Value Ref Range   Alcohol, Ethyl (B) 86 (H) <5 mg/dL    Comment:        LOWEST DETECTABLE LIMIT FOR SERUM ALCOHOL IS 5 mg/dL FOR MEDICAL PURPOSES ONLY   Salicylate level     Status: None   Collection Time: 12/20/15  4:01 AM  Result Value Ref Range   Salicylate Lvl <4.2 2.8 - 30.0 mg/dL  Acetaminophen level     Status: Abnormal   Collection Time: 12/20/15  4:01 AM  Result Value Ref Range   Acetaminophen (Tylenol), Serum <10 (L) 10 - 30 ug/mL    Comment:        THERAPEUTIC CONCENTRATIONS VARY SIGNIFICANTLY. A RANGE OF 10-30 ug/mL MAY BE AN EFFECTIVE CONCENTRATION FOR MANY PATIENTS. HOWEVER, SOME ARE BEST TREATED AT CONCENTRATIONS OUTSIDE THIS RANGE. ACETAMINOPHEN CONCENTRATIONS >150 ug/mL AT 4 HOURS AFTER INGESTION AND >50 ug/mL AT 12 HOURS AFTER INGESTION ARE OFTEN ASSOCIATED WITH TOXIC REACTIONS.   CBC     Status: Abnormal   Collection Time: 12/20/15  4:01 AM  Result Value Ref Range   WBC 10.6 (H) 4.0 - 10.5 K/uL   RBC 4.82 4.22 - 5.81 MIL/uL   Hemoglobin 14.1 13.0 - 17.0 g/dL   HCT 42.5 39.0 - 52.0 %   MCV 88.2 78.0 - 100.0 fL   MCH 29.3 26.0 - 34.0 pg   MCHC 33.2  30.0 - 36.0 g/dL   RDW 13.5 11.5 - 15.5 %   Platelets 321 150 - 400 K/uL  Urine rapid drug screen (hosp performed) (Not at [facility])     Status: Abnormal   Collection Time: 12/20/15  4:04 AM  Result Value Ref Range   Opiates NONE DETECTED NONE DETECTED   Cocaine POSITIVE (A) NONE DETECTED   Benzodiazepines NONE DETECTED NONE DETECTED   Amphetamines NONE DETECTED NONE DETECTED   Tetrahydrocannabinol POSITIVE (A) NONE DETECTED   Barbiturates NONE DETECTED NONE DETECTED    Comment:        DRUG SCREEN FOR MEDICAL PURPOSES ONLY.  IF CONFIRMATION IS NEEDED FOR ANY PURPOSE, NOTIFY LAB WITHIN 5 DAYS.        LOWEST DETECTABLE LIMITS FOR URINE DRUG SCREEN Drug Class       Cutoff (ng/mL) Amphetamine      1000 Barbiturate      200 Benzodiazepine   353 Tricyclics       614 Opiates          300 Cocaine          300 THC              50     Blood Alcohol level:  Lab Results  Component Value Date   ETH 86* 12/20/2015   ETH 68* 43/15/4008    Metabolic Disorder Labs:  No results found for: HGBA1C, MPG No results found for: PROLACTIN Lab Results  Component Value Date   CHOL 136 02/05/2015   TRIG 147 02/05/2015   HDL 54 02/05/2015   CHOLHDL 2.5 02/05/2015   VLDL 29 02/05/2015   LDLCALC 53 02/05/2015   LDLCALC 45 02/21/2014    Current Medications: Current Facility-Administered Medications  Medication Dose Route Frequency Provider Last Rate Last Dose  . abacavir-dolutegravir-lamiVUDine (TRIUMEQ) [phone] MG per tablet 1 tablet  1 tablet Oral Daily Antonietta Breach, PA-C   1 tablet at 12/20/15 1037  . acetaminophen (TYLENOL) tablet 1,000 mg  1,000 mg  Oral Q6H PRN Antonietta Breach, PA-C      . albuterol (PROVENTIL HFA;VENTOLIN HFA) 108 (90 Base) MCG/ACT inhaler 2 puff  2 puff Inhalation Q6H PRN Antonietta Breach, PA-C      . busPIRone (BUSPAR) tablet 20 mg  20 mg Oral BID Antonietta Breach, PA-C   20 mg at 12/20/15 6301  . divalproex (DEPAKOTE ER) 24 hr tablet 500 mg  500 mg Oral BID Antonietta Breach, PA-C    500 mg at 12/20/15 1037  . gabapentin (NEURONTIN) capsule 300 mg  300 mg Oral TID Antonietta Breach, PA-C   300 mg at 12/20/15 1037  . hydrOXYzine (ATARAX/VISTARIL) tablet 25 mg  25 mg Oral TID PRN Corena Pilgrim, MD      . nicotine (NICODERM CQ - dosed in mg/24 hours) patch 21 mg  21 mg Transdermal Daily Antonietta Breach, PA-C   21 mg at 12/20/15 1000  . ondansetron (ZOFRAN) tablet 4 mg  4 mg Oral Q8H PRN Antonietta Breach, PA-C      . traZODone (DESYREL) tablet 100 mg  100 mg Oral QHS PRN Antonietta Breach, PA-C       Current Outpatient Prescriptions  Medication Sig Dispense Refill  . Abacavir-Dolutegravir-Lamivud (TRIUMEQ) 600-50-300 MG TABS Take 1 tablet by mouth daily. For HIV infection 30 tablet 3  . acetaminophen (TYLENOL) 500 MG tablet Take 1,000 mg by mouth every 6 (six) hours as needed for mild pain.    Marland Kitchen albuterol (PROVENTIL HFA;VENTOLIN HFA) 108 (90 BASE) MCG/ACT inhaler Inhale 2 puffs into the lungs every 6 (six) hours as needed for wheezing or shortness of breath.    . busPIRone (BUSPAR) 10 MG tablet TAKE 2 TABLETS BY MOUTH TWICE DAILY FOR ANXIETY 120 tablet 0  . divalproex (DEPAKOTE ER) 500 MG 24 hr tablet Take 1 tablet (500 mg total) by mouth 2 (two) times daily. For mood stabilization 60 tablet 2  . gabapentin (NEURONTIN) 300 MG capsule Take 300 mg by mouth 3 (three) times daily.    . traZODone (DESYREL) 50 MG tablet Take 100 mg by mouth at bedtime as needed for sleep.   5  . acyclovir (ZOVIRAX) 400 MG tablet Take 1 tablet (400 mg total) by mouth 3 (three) times daily. (Patient not taking: Reported on 12/20/2015) 30 tablet 0  . guaiFENesin (ROBITUSSIN) 100 MG/5ML liquid Take 5-10 mLs (100-200 mg total) by mouth every 4 (four) hours as needed for cough. (Patient not taking: Reported on 12/20/2015) 60 mL 0   PTA Medications:  (Not in a hospital admission)  Musculoskeletal: Strength & Muscle Tone: within normal limits Gait & Station: normal Patient leans: N/A  Psychiatric Specialty Exam: Physical  Exam  ROS  Blood pressure 136/78, pulse 84, temperature 98.3 F (36.8 C), temperature source Oral, resp. rate 18, SpO2 100 %.There is no weight on file to calculate BMI.  General Appearance: Casual  Eye Contact::  Fair  Speech:  Clear and Coherent  Volume:  Decreased  Mood:  Depressed  Affect:  Appropriate  Thought Process:  Goal Directed and Intact  Orientation:  Full (Time, Place, and Person)  Thought Content:  Symptoms, worries   Suicidal Thoughts:  Yes.  with intent/plan  Homicidal Thoughts:  No  Memory:  Immediate;   Good Recent;   Good Remote;   Good  Judgement:  Fair  Insight:  Present  Psychomotor Activity:  Decreased  Concentration:  Good  Recall:  Good  Fund of Knowledge:Good  Language: Good  Akathisia:  No  Handed:  Right  AIMS (if indicated):     Assets:  Communication Skills Desire for Improvement Housing Leisure Time Physical Health Resilience Social Support  ADL's:  Intact  Cognition: WNL  Sleep:        Treatment Plan Summary: Daily contact with patient to assess and evaluate symptoms and progress in treatment and Medication management   Admit to the [facility] for further monitoring and evaluation.   Observation Level/Precautions:  Continuous Observation  Laboratory:  CBC Chemistry Profile UDS  Psychotherapy:  Individual   Medications:  Continue Buspar, Neurontin, Trazodone for anxiety and mood stabilization   Consultations:  As needed   Discharge Concerns: Safety and Stability   Estimated LOS: Less than 48 hours  Other:  Arrange for outpatient follow up at discharge to ensure continued medication compliance    Gloriana Piltz, Mickel Baas, NP 3/24/20174:05 PM

## 2015-12-21 ENCOUNTER — Encounter (HOSPITAL_COMMUNITY): Payer: Self-pay

## 2015-12-21 ENCOUNTER — Inpatient Hospital Stay (HOSPITAL_COMMUNITY)
Admission: AD | Admit: 2015-12-21 | Discharge: 2015-12-23 | DRG: 885 | Disposition: A | Discharge status: Home / Self Care | Payer: Federal, State, Local not specified - Other | Source: Intra-hospital | Attending: Psychiatry | Admitting: Psychiatry

## 2015-12-21 DIAGNOSIS — B20 Human immunodeficiency virus [HIV] disease: Secondary | ICD-10-CM

## 2015-12-21 DIAGNOSIS — F319 Bipolar disorder, unspecified: Secondary | ICD-10-CM | POA: Diagnosis present

## 2015-12-21 DIAGNOSIS — F17210 Nicotine dependence, cigarettes, uncomplicated: Secondary | ICD-10-CM | POA: Diagnosis present

## 2015-12-21 DIAGNOSIS — R45851 Suicidal ideations: Secondary | ICD-10-CM | POA: Diagnosis present

## 2015-12-21 DIAGNOSIS — F3132 Bipolar disorder, current episode depressed, moderate: Secondary | ICD-10-CM | POA: Diagnosis not present

## 2015-12-21 DIAGNOSIS — F1210 Cannabis abuse, uncomplicated: Secondary | ICD-10-CM | POA: Diagnosis not present

## 2015-12-21 DIAGNOSIS — F3130 Bipolar disorder, current episode depressed, mild or moderate severity, unspecified: Secondary | ICD-10-CM

## 2015-12-21 DIAGNOSIS — Z915 Personal history of self-harm: Secondary | ICD-10-CM | POA: Diagnosis not present

## 2015-12-21 MED ORDER — TRAZODONE HCL 100 MG PO TABS
100.0000 mg | ORAL_TABLET | Freq: Every evening | ORAL | Status: DC | PRN
  Administered 2015-12-21 – 2015-12-22 (×2): 100 mg via ORAL
  Filled 2015-12-21 (×2): qty 1

## 2015-12-21 MED ORDER — HYDROXYZINE HCL 25 MG PO TABS
25.0000 mg | ORAL_TABLET | Freq: Three times a day (TID) | ORAL | Status: DC | PRN
  Administered 2015-12-21: 25 mg via ORAL
  Filled 2015-12-21: qty 10
  Filled 2015-12-21: qty 1

## 2015-12-21 MED ORDER — ONDANSETRON HCL 4 MG PO TABS
4.0000 mg | ORAL_TABLET | Freq: Three times a day (TID) | ORAL | Status: DC | PRN
  Administered 2015-12-21: 4 mg via ORAL
  Filled 2015-12-21: qty 1

## 2015-12-21 MED ORDER — FLUOXETINE HCL 10 MG PO CAPS
10.0000 mg | ORAL_CAPSULE | Freq: Every day | ORAL | Status: DC
  Administered 2015-12-21 – 2015-12-23 (×3): 10 mg via ORAL
  Filled 2015-12-21 (×4): qty 1

## 2015-12-21 MED ORDER — NICOTINE 21 MG/24HR TD PT24
21.0000 mg | MEDICATED_PATCH | Freq: Every day | TRANSDERMAL | Status: DC
  Administered 2015-12-22: 21 mg via TRANSDERMAL
  Filled 2015-12-21 (×4): qty 1

## 2015-12-21 MED ORDER — BUSPIRONE HCL 10 MG PO TABS
10.0000 mg | ORAL_TABLET | Freq: Two times a day (BID) | ORAL | Status: DC
  Administered 2015-12-21 – 2015-12-23 (×4): 10 mg via ORAL
  Filled 2015-12-21: qty 1
  Filled 2015-12-21: qty 2
  Filled 2015-12-21 (×4): qty 1

## 2015-12-21 MED ORDER — ABACAVIR-DOLUTEGRAVIR-LAMIVUD 600-50-300 MG PO TABS
1.0000 | ORAL_TABLET | Freq: Every day | ORAL | Status: DC
  Administered 2015-12-21 – 2015-12-23 (×3): 1 via ORAL
  Filled 2015-12-21 (×4): qty 1

## 2015-12-21 MED ORDER — ALBUTEROL SULFATE HFA 108 (90 BASE) MCG/ACT IN AERS: 2.0000 | INHALATION_SPRAY | Freq: Four times a day (QID) | RESPIRATORY_TRACT | Status: DC | PRN

## 2015-12-21 MED ORDER — ACETAMINOPHEN 500 MG PO TABS: 1000.0000 mg | ORAL_TABLET | Freq: Four times a day (QID) | ORAL | Status: DC | PRN

## 2015-12-21 MED ORDER — ACETAMINOPHEN 325 MG PO TABS: 650.0000 mg | ORAL_TABLET | Freq: Four times a day (QID) | ORAL | Status: DC | PRN

## 2015-12-21 MED ORDER — DIVALPROEX SODIUM ER 500 MG PO TB24
500.0000 mg | ORAL_TABLET | Freq: Two times a day (BID) | ORAL | Status: DC
Start: 2015-12-21 — End: 2015-12-23
  Administered 2015-12-21 – 2015-12-23 (×5): 500 mg via ORAL
  Filled 2015-12-21 (×7): qty 1

## 2015-12-21 MED ORDER — GABAPENTIN 300 MG PO CAPS
300.0000 mg | ORAL_CAPSULE | Freq: Three times a day (TID) | ORAL | Status: DC
  Administered 2015-12-21 – 2015-12-23 (×8): 300 mg via ORAL
  Filled 2015-12-21 (×10): qty 1

## 2015-12-21 MED ORDER — BUSPIRONE HCL 5 MG PO TABS
20.0000 mg | ORAL_TABLET | Freq: Two times a day (BID) | ORAL | Status: DC
  Administered 2015-12-21: 20 mg via ORAL
  Filled 2015-12-21: qty 1

## 2015-12-21 NOTE — ED Notes (Signed)
Patient noted in room. No complaints, stable, in no acute distress. Q15 minute rounds and monitoring via Security Cameras to continue.  

## 2015-12-21 NOTE — BH Assessment (Signed)
BHH Assessment Progress Note   Patient was seen by Earlene Plateravis NP and Timira Bieda LCAS. Patient presented very tearful and could not contract for safety stating he still had thoughts of self harm with a plan. Patient is open to medication interventions and has been very liable at times. Patient has also been observed "talking into his pillow." Patient per Earlene PlaterDavis, will be admitted to a 300 hall bed for an inpatient admission.

## 2015-12-21 NOTE — Plan of Care (Signed)
BHH Observation Crisis Plan  Reason for Crisis Plan:  Substance Abuse   Plan of Care:  Referral for Substance Abuse  Family Support:    NA  Current Living Environment:  Living Arrangements: Spouse/significant other  Insurance:   Hospital Account    Name Acct ID Class Status Primary Coverage   Consuello ClossRussell, Arlind A [id number] BEHAVIORAL HEALTH OBSERVATION Open None        Guarantor Account (for Hospital Account [id number])    Name Relation to Pt Service Area Active? Acct Type   Consuello Clossussell, Madix A [facility]   Address Phone       [address] [place], [place] [address] [phone](H)          Coverage Information (for Hospital Account [id number])    Not on file      Legal Guardian:     Primary Care Provider:  No primary care provider on file.  Current Outpatient Providers:  only MD is Jerolyn Centerynthia Snyder for HIV meds  Psychiatrist:   NA  Counselor/Therapist:   NA  Compliant with Medications:  Yes  Additional Information:   Cooper RenderSadler, Jalysa Swopes Jean Horne 3/25/20171:50 AM

## 2015-12-21 NOTE — Progress Notes (Signed)
Pt. Is a 30 year old male with a history of bipolar D/O, insomnia, substance abuse, ADHD, and HIV.  Pt. Reports his Mother passed away on March 20th 7 years ago and this date is always a trigger for him.  Reporting increased depression with passive SI no plan.  Pt. Was positive for THC, cocaine and had an alcohol level of 86. Pt. Has a court date on 03/28 for shoplifting.  Pt. States he needs a medication adjustment.  Pt. Does have a partner that is supportive.  No complaints of pain or discomfort noted.  Pt. Remains safe on the unit and is presently resting quietly.

## 2015-12-21 NOTE — Progress Notes (Signed)
Patient got transferred from Obs. Unit to Inpatient RM 307 -1. Patient in stable condition. Staff walked patient to unit.

## 2015-12-21 NOTE — Progress Notes (Signed)
BHH INPATIENT:  Family/Significant Other Suicide Prevention Education  Suicide Prevention Education:  Patient Refusal for Family/Significant Other Suicide Prevention Education: The patient Edwin Martinez has refused to provide written consent for family/significant other to be provided Family/Significant Other Suicide Prevention Education during admission and/or prior to discharge.  Physician notified.  Cooper RenderSadler, Cally Nygard Jean Horne 12/21/2015, 2:08 AM

## 2015-12-21 NOTE — Progress Notes (Signed)
Patient seen during rounds this morning in the Observation unit. Edwin Martinez reports that his symptoms worsened overnight and now is endorsing multiple plans of self harm. The patient stated "I could jump in front of car or hang myself. I feel very depressed. I don't feel that my medications are working. I need an adjustment." During the assessment the patient was noted to be very tearful and distraught. Denied any psychotic or manic symptoms. The patient did appear severely depressed and hopeless. Started Prozac 10 mg daily and decreased Buspar to 10 mg bid due to reports of ineffectiveness per patient. Will admit to inpatient unit for further medication management and mood stabilization.

## 2015-12-21 NOTE — Progress Notes (Signed)
Nursing Shift Note:  Patient resting with eyes closed, sleeping, but easily arousable to verbal stimulation. Patient is cooperative, oriented, mood depressed with blunted affect, admits to passive SI without a plan, denies HI/AVH. Patient does contract for safety in that he agrees to alert staff first should he develop any imminent thoughts to do so. Nurse administering medications, providing emotional support, therapeutic support and ensuring constant observation of patient for safety except when pt in bathroom. Patient compliant with medications, cooperative, reamains safe on Unit.

## 2015-12-22 ENCOUNTER — Encounter (HOSPITAL_COMMUNITY): Payer: Self-pay | Admitting: Registered Nurse

## 2015-12-22 DIAGNOSIS — F1210 Cannabis abuse, uncomplicated: Secondary | ICD-10-CM

## 2015-12-22 LAB — VALPROIC ACID LEVEL: VALPROIC ACID LVL: 43 ug/mL — AB (ref 50.0–100.0)

## 2015-12-22 NOTE — Progress Notes (Signed)
The patient was unable to attend last evening's AA meeting since he was admitted to the hallway near the conclusion of the group.

## 2015-12-22 NOTE — BHH Group Notes (Signed)
BHH Group Notes:  (Nursing/MHT/Case Management/Adjunct)  Date:  12/22/2015  Time:  1300  Type of Therapy:  Nurse Education - Healthy Support Systems  Participation Level:  Active  Participation Quality:  Attentive  Affect:  Anxious  Cognitive:  Alert  Insight:  Limited  Engagement in Group:  Engaged  Modes of Intervention:  Education  Summary of Progress/Problems: Patient attended nursing education group.   Merian CapronFriedman, Katera Rybka [facility] 12/22/2015, 1340

## 2015-12-22 NOTE — Progress Notes (Signed)
Patient did attend the evening speaker AA meeting.  

## 2015-12-22 NOTE — [facility] Suicide Risk Assessment (Signed)
[facility]   Nursing information obtained from:  Patient Demographic factors:  Male, Cardell PeachGay, lesbian, or bisexual orientation, Low socioeconomic status, Unemployed Current Mental Status:  Suicidal ideation indicated by patient Loss Factors:  Loss of significant relationship, Financial problems / change in socioeconomic status (date of Moms death 7years ago) Historical Factors:  Prior suicide attempts, Family history of suicide, Family history of mental illness or substance abuse Risk Reduction Factors:  Living with another person, especially a relative, Positive social support  Total Time spent with patient: 1.5 hours Principal Problem: <principal problem not specified> Diagnosis:   Patient Active Problem List   Diagnosis Date Noted  . Bipolar affective disorder, depressed, moderate degree ([facility]CC) [F31.32] 12/21/2015  . Bipolar affect, depressed ([facility]) [F31.30] 12/21/2015  . Suicidal ideation [R45.851] 09/14/2014  . Depression [F32.9]   . Suicidal ideations [R45.851]   . Bipolar I disorder, most recent episode depressed ([facility]) [F31.30] 05/24/2014  . GAD (generalized anxiety disorder) [F41.1] 05/24/2014  . MDD (major depressive disorder) ([facility]) [F32.9] 05/23/2014  . Suicide attempt ([facility]) [T14.91] 05/23/2014  . MDD (major depressive disorder), recurrent episode, severe ([facility]) [F33.2] 05/23/2014  . Transaminitis [R74.0] 03/20/2014  . ADHD (attention deficit hyperactivity disorder) [F90.9] 09/12/2012  . INSOMNIA, CHRONIC [G47.00] 05/20/2007  . CARPAL TUNNEL SYNDROME [G56.00] 05/20/2007  . 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 HIV DISEASE [B20] 05/05/2007  . 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 Has had suicidal toughts.    Continued Clinical Symptoms:  Alcohol Use Disorder Identification Test Final Score (AUDIT): 3 The  "Alcohol Use Disorders Identification Test", Guidelines for Use in Primary Care, Second Edition.  World Science writerHealth Organization [facility](WHO). Score between 0-7:  no or low risk or alcohol related problems. Score between 8-15:  moderate risk of alcohol related problems. Score between 16-19:  high risk of alcohol related problems. Score 20 or above:  warrants further diagnostic evaluation for alcohol dependence and treatment.   CLINICAL FACTORS:   Depression:   Anhedonia Hopelessness Impulsivity Alcohol/Substance Abuse/Dependencies More than one psychiatric diagnosis Unstable or Poor Therapeutic Relationship   Musculoskeletal: Strength & Muscle Tone: within normal limits Gait & Station: normal Patient leans: N/A  Psychiatric Specialty Exam: Review of Systems  Constitutional: Negative for fever.  Cardiovascular: Negative for chest pain.  Skin: Negative for rash.  Psychiatric/Behavioral: Positive for depression, suicidal ideas and substance abuse.    Blood pressure 131/75, pulse 69, temperature 97.6 F (36.4 C), temperature source Oral, resp. rate 17, height 5' 7.25" (1.708 m), weight 70.761 kg (156 lb), SpO2 100 %.Body mass index is 24.26 kg/(m^2).  General Appearance: Casual  Eye Contact::  Fair  Speech:  Slow  Volume:  Decreased  Mood:  Dysphoric  Affect:  Congruent  Thought Process:  Coherent  Orientation:  Full (Time, Place, and Person)  Thought Content:  Rumination  Suicidal Thoughts:  Yes.  without intent/plan  Homicidal Thoughts:  No  Memory:  Immediate;   Fair Recent;   Fair  Judgement:  poor  Insight:  Shallow  Psychomotor Activity:  Decreased  Concentration:  Fair  Recall:  [employer]Fair  Fund of Knowledge:Fair  Language: Fair  Akathisia:  Negative  Handed:  Right  AIMS (if indicated):     Assets:  Desire for Improvement  Sleep:  Number of Hours: 6  Cognition: WNL  ADL's:  Intact    COGNITIVE FEATURES THAT CONTRIBUTE TO RISK:  Closed-mindedness    SUICIDE RISK:    Moderate:  Frequent suicidal ideation with limited intensity, and duration, some specificity in terms of plans, no associated intent, good self-control, limited dysphoria/symptomatology, some risk factors present, and identifiable protective factors, including available and accessible social support.  PLAN OF CARE: admit for stabilization and safety. Medication management . Drug use and mood symptoms  I certify that inpatient services furnished can reasonably be expected to improve the patient's condition.   Thresa Ross, MD 12/22/2015, 10:21 AM

## 2015-12-22 NOTE — H&P (Signed)
Psychiatric Admission Assessment Adult  Patient Identification: Edwin Martinez MRN:  [id number] Date of Evaluation:  12/22/2015 Chief Complaint:  BIPOLAR 1 DISORDER-DEPRESSED Principal Diagnosis: Bipolar affect, depressed (HCC) Diagnosis:   Patient Active Problem List   Diagnosis Date Noted  . Marijuana abuse [F12.10]   . Bipolar affective disorder, depressed, moderate degree (HCC) [F31.32] 12/21/2015  . Bipolar affect, depressed (HCC) [F31.30] 12/21/2015  . Suicidal ideation [R45.851] 09/14/2014  . Depression [F32.9]   . Suicidal ideations [R45.851]   . Bipolar I disorder, most recent episode depressed (HCC) [F31.30] 05/24/2014  . GAD (generalized anxiety disorder) [F41.1] 05/24/2014  . MDD (major depressive disorder) (HCC) [F32.9] 05/23/2014  . Suicide attempt (HCC) [T14.91] 05/23/2014  . MDD (major depressive disorder), recurrent episode, severe (HCC) [F33.2] 05/23/2014  . Transaminitis [R74.0] 03/20/2014  . ADHD (attention deficit hyperactivity disorder) [F90.9] 09/12/2012  . INSOMNIA, CHRONIC [G47.00] 05/20/2007  . CARPAL TUNNEL SYNDROME [G56.00] 05/20/2007  . 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 HIV DISEASE [B20] 05/05/2007  . 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  On unit evaluation: Patient reports that he has had depression for years and was going to [facility] for out patient treatment but has been off of his medication for 1-2 months except for his Depakote and HIV medications.  States that his depression started to worsen after the anniversary of his mothers death on 03-21-24th; along with that "I done some crack cocaine; and being off of my medications; I just started feeling depressed,  lonely, hopeless, and unworthy.   That is how I felt before and I just called to get some help."  Patient states that he lives a lone in [place] and his family is dead except for a nephew and a brother that is incarcerated.  States that his boyfriend "significant other" is very supported and encouraged him to seek help.  Patient denies homicidal ideation, psychosis, and paranoia.  At this time he denies suicidal ideation but continues to endorse worsening depression with hopelessness, unworthiness, and feeling lonely.   Associated Signs/Symptoms: Depression Symptoms:  depressed mood, difficulty concentrating, hopelessness, suicidal thoughts without plan, anxiety, (Hypo) Manic Symptoms:  Distractibility, Impulsivity, Anxiety Symptoms:  Excessive Worry, Psychotic Symptoms:  Denies PTSD Symptoms: Denies Total Time spent with patient: 45 minutes  Past Psychiatric History:  Major Depression; Mood disorder, Bipolar Disorder; Generalized Anxiety;  Prior suicide attempt; inpatient and outpatient services.  Has been on multiple psychotropics.   Is the patient at risk to self? Yes.    Has the patient been a risk to self in the past 6 months? No.  Has the patient been a risk to self within the distant past? No.  Is the patient a risk to others? No.  Has the patient been a risk to others in the past 6 months? No.  Has the patient been a risk to others within the distant past? No.   Prior Inpatient Therapy:   Prior Outpatient Therapy:    Alcohol Screening: 1. How often do you have a drink containing alcohol?: 2 to 3 times a week 2. How many drinks containing alcohol do you have on a typical day when you are drinking?: 1 or 2 3. How often do you have six or more drinks on one occasion?:  Never Preliminary Score: 0 9. Have you or someone else been injured as a result of your drinking?: No 10. Has a relative or friend or a doctor or another health worker been concerned about your drinking or suggested you cut down?: No Alcohol  Use Disorder Identification Test Final Score (AUDIT): 3 Brief Intervention: AUDIT score less than 7 or less-screening does not suggest unhealthy drinking-brief intervention not indicated Substance Abuse History in the last 12 months:  Yes.   Consequences of Substance Abuse: Denies Previous Psychotropic Medications: Yes  Psychological Evaluations: No  Past Medical History:  Past Medical History  Diagnosis Date  . HIV (human immunodeficiency virus infection) (HCC)   . Bipolar 1 disorder (HCC)   . Schizophrenia (HCC)   . Asthma   . Hypertension     Past Surgical History  Procedure Laterality Date  . Dental surgery     Family History:  Family History  Problem Relation Age of Onset  . Huntington's disease Father   . Heart disease Mother    Family Psychiatric  History: Denies family psych history Tobacco Screening: Doesn't smoke Social History:  History  Alcohol Use  . 48.0 oz/week  . 80 Standard drinks or equivalent per week    Comment: once week      History  Drug Use  . 2.00 per week  . Special: "Crack" cocaine    Comment: every other day     Additional Social History:      Pain Medications: see PTA Prescriptions: see PTA Over the Counter: see PTA History of alcohol / drug use?: Yes Longest period of sobriety (when/how long): 2 weeks Negative Consequences of Use: Financial, Personal relationships Withdrawal Symptoms: Other (Comment) (NA) Name of Substance 1: crack/cocaine 1 - Age of First Use: 15 1 - Amount (size/oz): 20 Roc 1 - Frequency: Every Other Day 1 - Duration: ongoing 1 - Last Use / Amount: 12-19-2015 Name of Substance 2: THC 2 - Age of First Use: 17 2 - Amount (size/oz): 1 joint 2 - Frequency: 2-3 times month 2 - Duration: ongoing 2 - Last Use / Amount: 12-19-2015 Name of Substance 3: alcohol 3 - Age of First Use: 17 3 - Amount (size/oz): 1-40ounce 3 - Frequency: 2 times week 3 - Duration: ongoing 3 - Last Use / Amount: 12-19-2015               Allergies:   Allergies  Allergen Reactions  . Atripla [Efavirenz-Emtricitab-Tenofovir] Other (See Comments)    DEPRESSION AND SUICIDE ATTEMPT  . Magnesium-Containing Compounds Other (See Comments)    MAGNESIUM ,COMPOUNDS CAN REDUCE LEVELS OF DOLUTEGRAVIR SO SHOULD NOT BE COADMIN WITH TIVICAY OR TRIUMEQ  . Esomeprazole Magnesium Cough  . Bactrim [Sulfamethoxazole-Trimethoprim] Rash    Patient reported  . Peanuts [Peanut Oil] Rash  . Penicillins Rash   Lab Results: No results found for this or any previous visit (from the past 48 hour(s)).  Blood Alcohol level:  Lab Results  Component Value Date   ETH 86* 12/20/2015   ETH 68* 10/26/2014    Metabolic Disorder Labs:  No results found for: HGBA1C, MPG No results found for: PROLACTIN Lab Results  Component Value Date   CHOL 136 02/05/2015   TRIG 147 02/05/2015   HDL 54 02/05/2015   CHOLHDL 2.5 02/05/2015   VLDL 29 02/05/2015   LDLCALC 53 02/05/2015   LDLCALC 45 02/21/2014    Current Medications: Current Facility-Administered Medications  Medication Dose Route Frequency Provider Last Rate Last Dose  .  abacavir-dolutegravir-lamiVUDine (TRIUMEQ) 600-50-300 MG per tablet 1 tablet  1 tablet Oral Daily Charm Rings, NP   1 tablet at 12/22/15 [phone]  . acetaminophen (TYLENOL) tablet 650 mg  650 mg Oral Q6H PRN Thermon Leyland, NP      . albuterol (PROVENTIL HFA;VENTOLIN HFA) 108 (90 Base) MCG/ACT inhaler 2 puff  2 puff Inhalation Q6H PRN Charm Rings, NP      . busPIRone (BUSPAR) tablet 10 mg  10 mg Oral BID Thermon Leyland, NP   10 mg at 12/22/15 0819  . divalproex (DEPAKOTE ER) 24 hr tablet 500 mg  500 mg Oral BID Charm Rings, NP   500 mg at 12/22/15 0819  . FLUoxetine (PROZAC) capsule 10 mg  10 mg Oral Daily Thermon Leyland, NP   10 mg at 12/22/15 0819  . gabapentin (NEURONTIN) capsule 300 mg  300 mg Oral TID Charm Rings, NP   300 mg at 12/22/15 1202  . hydrOXYzine (ATARAX/VISTARIL) tablet 25 mg  25 mg Oral TID PRN  Charm Rings, NP   25 mg at 12/21/15 2218  . nicotine (NICODERM CQ - dosed in mg/24 hours) patch 21 mg  21 mg Transdermal Daily Charm Rings, NP   21 mg at 12/22/15 0819  . ondansetron (ZOFRAN) tablet 4 mg  4 mg Oral Q8H PRN Charm Rings, NP   4 mg at 12/21/15 1545  . traZODone (DESYREL) tablet 100 mg  100 mg Oral QHS PRN Charm Rings, NP   100 mg at 12/21/15 2217   PTA Medications: Prescriptions prior to admission  Medication Sig Dispense Refill Last Dose  . Abacavir-Dolutegravir-Lamivud (TRIUMEQ) 600-50-300 MG TABS Take 1 tablet by mouth daily. For HIV infection 30 tablet 3 12/21/2015 at Unknown time  . albuterol (PROVENTIL HFA;VENTOLIN HFA) 108 (90 BASE) MCG/ACT inhaler Inhale 2 puffs into the lungs every 6 (six) hours as needed for wheezing or shortness of breath.   Past Week at Unknown time  . busPIRone (BUSPAR) 10 MG tablet TAKE 2 TABLETS BY MOUTH TWICE DAILY FOR ANXIETY 120 tablet 0 12/20/2015 at Unknown time  . divalproex (DEPAKOTE ER) 500 MG 24 hr tablet Take 1 tablet (500 mg total) by mouth 2 (two) times daily. For mood stabilization 60 tablet 2 12/20/2015 at Unknown time  . gabapentin (NEURONTIN) 300 MG capsule Take 300 mg by mouth 3 (three) times daily.   Past Month at Unknown time  . traZODone (DESYREL) 50 MG tablet Take 100 mg by mouth at bedtime as needed for sleep.   5 Past Month at Unknown time  . acetaminophen (TYLENOL) 500 MG tablet Take 1,000 mg by mouth every 6 (six) hours as needed for mild pain.   Unknown at Unknown time  . acyclovir (ZOVIRAX) 400 MG tablet Take 1 tablet (400 mg total) by mouth 3 (three) times daily. (Patient not taking: Reported on 12/20/2015) 30 tablet 0 Unknown at Unknown time  . guaiFENesin (ROBITUSSIN) 100 MG/5ML liquid Take 5-10 mLs (100-200 mg total) by mouth every 4 (four) hours as needed for cough. (Patient not taking: Reported on 12/20/2015) 60 mL 0 More than a month at Unknown time    Musculoskeletal: Strength & Muscle Tone: within normal  limits Gait & Station: normal Patient leans: N/A  Psychiatric Specialty Exam: Physical Exam  Constitutional: He is oriented to person, place, and time.  HENT:  Head: Normocephalic.  Neck: Normal range of motion.  Respiratory: Effort normal.  Musculoskeletal: Normal range of  motion.  Neurological: He is alert and oriented to person, place, and time. He displays no atrophy. He exhibits normal muscle tone.  Skin: Skin is warm and dry.  Psychiatric: His speech is normal and behavior is normal. Judgment normal. His mood appears anxious. Cognition and memory are normal. He exhibits a depressed mood. He expresses suicidal ideation.    Review of Systems  Endo/Heme/Allergies:       HIV positive   Psychiatric/Behavioral: Positive for depression, suicidal ideas and substance abuse. Negative for hallucinations. The patient is nervous/anxious and has insomnia.   All other systems reviewed and are negative.   Blood pressure 131/75, pulse 69, temperature 97.6 F (36.4 C), temperature source Oral, resp. rate 17, height 5' 7.25" (1.708 m), weight 70.761 kg (156 lb), SpO2 100 %.Body mass index is 24.26 kg/(m^2).  General Appearance: Casual and Fairly Groomed  Eye Contact::  Good  Speech:  Clear and Coherent and Normal Rate  Volume:  Normal  Mood:  Depressed  Affect:  Depressed  Thought Process:  Circumstantial and Goal Directed  Orientation:  Full (Time, Place, and Person)  Thought Content:  Denies hallucinations, delusions, and paranoia  Suicidal Thoughts:  Denies at this time  Homicidal Thoughts:  No  Memory:  Immediate;   Good Recent;   Good Remote;   Good  Judgement:  Fair  Insight:  Fair  Psychomotor Activity:  Normal  Concentration:  Fair  Recall:  Good  Fund of Knowledge:Good  Language: Good  Akathisia:  No  Handed:  Right  AIMS (if indicated):     Assets:  Communication Skills Desire for Improvement Housing Physical Health Social Support  ADL's:  Intact  Cognition: WNL   Sleep:  Number of Hours: 6     Treatment Plan Summary: Daily contact with patient to assess and evaluate symptoms and progress in treatment and Medication management  1. Edwin Martinez was admitted to [facility] under the service of Dr. Dub Mikes for Bipolar affect, depressed [facility]), crisis management, and stabilization. 2. Routine labs; which include CBC, CMP, UA, ETOH, and UDS were reviewed and PRN's ordered if problem indicated; medical consultation if indicated to treat health problems.   3. During this hospital stay Edwin Martinez will receive psychosocial and assessment.  A treatment plan developed to decrease risk of relapse upon discharge and the need for readmission.  Edwin Martinez will participate in group therapy.  Psychotherapy:  Psychosocial education regarding relapse prevention and self care; Social and Doctor, hospital; Learning based strategies; Cognitive behavioral; and family object relations individuation separation intervention psychotherapies can be considered.   4. Will maintain observation checks every 15 minutes for safety. 5. Due to long standing Depression and mood disorder.  behavioral/mood/substance abuse problems; Medication management to reduce current symptoms to improve patient's overall level of functioning.  Will restart home medications:  Mood Stabilization:   Depakote ER 500 mg Bid; Anxiety: Neurontin 300 mg Tid; Buspar 10 mg Tid; Major Depression Prozac 10 mg daily; Insomnia Trazodone 100 mg Q hs prn.  Will monitory medications for adverse reactions.  Depakote level ordered.     6. Will Continue to monitor Edwin Martinez for mood/behavioral/substance withdrawal symptoms. 7. Health care follow ups to be scheduled when indicated for medical problems at discharge 8. Social work will consult with family for collateral information and discuss discharge and follow up plan.    certify that inpatient services furnished can  reasonably be expected to improve the patient's condition.  Rankin, Shuvon, NP 3/26/20172:16 PM I have examined the patient and agreed with the findings of H&P and treatment plan. I have also done suicide assessment on this patient.

## 2015-12-22 NOTE — Tx Team (Signed)
Initial Interdisciplinary Treatment Plan   PATIENT STRESSORS: Loss of mother. Death anniversary 3-20 (7 years) Medication change or noncompliance Substance abuse   PATIENT STRENGTHS: Ability for insight Average or above average intelligence Communication skills General fund of knowledge Motivation for treatment/growth Supportive family/friends   PROBLEM LIST: Problem List/Patient Goals Date to be addressed Date deferred Reason deferred Estimated date of resolution  "balance my medications; getting stable" 12-22-2015     "Use coping skills and get a better focus on my life" 12-22-2015     Depression  12-22-2015     Substance abuse 12-22-2015     Suicidal Ideation  12-22-2015                              DISCHARGE CRITERIA:  Improved stabilization in mood, thinking, and/or behavior Motivation to continue treatment in a less acute level of care Verbal commitment to aftercare and medication compliance  PRELIMINARY DISCHARGE PLAN: Attend aftercare/continuing care group Attend PHP/IOP Outpatient therapy Return to previous living arrangement  PATIENT/FAMIILY INVOLVEMENT: This treatment plan has been presented to and reviewed with the patient, Consuello ClossEdward A Mcmenamin.  The patient and family have been given the opportunity to ask questions and make suggestions.  Claiborne RiggZelda W Harkirat Orozco 12/22/2015, 12:20 AM

## 2015-12-22 NOTE — Progress Notes (Signed)
Edwin Martinez who goes by the name "[name]" has been oriented to the unit. He endorses passive SI however he is able to contract for safety at this time. He states he became severely depressed upon the 7th year anniversary of his mother's death on 3-20. His depression has worsened and he has been non compliant with taking his medications. He denies any financial issues with obtaining his medications and states he just never had them refilled. He has a supportive partner whom he lives with. He endorses smoking and states he is not ready to quit. He drinks at least one 40 oz. a day. Rates Anxiety 8/10 and Depression 8/10 as well as Hopelessness 8/10. Denies SI/HI/AVH at this time. Contracts for safety. Encouragement and support given. Medications administered as prescribed. Continue to monitor Q 15 minutes for patient safety and medication effectiveness.

## 2015-12-22 NOTE — Progress Notes (Signed)
D:Patient in the dayroom eating a snack on approach.  Patient states he had a very good day.  Patient states it was great because his medications are balanced and he has been thinking positively.  Patient states he met his goal today which was to feel like someone.  Patient denies SI/HI and denies AVH.   A: Staff to monitor Q 15 mins for safety.  Encouragement and support offered.  Scheduled medications administered per orders.  Trazodone administered prn for sleep. R: Patient remains safe on the unit.  Patient attended group tonight.  Patient visible on the unit and interacting with peers.  Patient taking administered medications.

## 2015-12-22 NOTE — [facility] Group Notes (Signed)
[facility] Group Notes: (Clinical Social Work)   12/22/2015      Type of Therapy:  Group Therapy   Participation Level:  Did Not Attend despite MHT prompting   Norleen Xie Grossman-Orr, LCSW 12/22/2015, 12:58 PM     

## 2015-12-22 NOTE — Progress Notes (Signed)
Patient up and visible in the milieu. Anxious in affect with congruent mood. Rates his depression and hopelessness both at an 8/10, anxiety at a 10/10. States his goal is to "feel like someone and to listen to others and find myself mentally." He has attended groups. No pain, physical complaints. Medicated per orders. Emotional support offered. Self inventory reviewed. Denies SI/HI and remains safe on level III obs.

## 2015-12-23 DIAGNOSIS — F3132 Bipolar disorder, current episode depressed, moderate: Principal | ICD-10-CM

## 2015-12-23 MED ORDER — TRAZODONE HCL 100 MG PO TABS: 100.0000 mg | ORAL_TABLET | Freq: Every evening | ORAL | Status: DC | PRN

## 2015-12-23 MED ORDER — HYDROXYZINE HCL 25 MG PO TABS: 25.0000 mg | ORAL_TABLET | Freq: Three times a day (TID) | ORAL | Status: DC | PRN

## 2015-12-23 MED ORDER — TRAZODONE HCL 150 MG PO TABS: 150.0000 mg | ORAL_TABLET | Freq: Every evening | ORAL | Status: DC | PRN

## 2015-12-23 MED ORDER — BUSPIRONE HCL 10 MG PO TABS
20.0000 mg | ORAL_TABLET | Freq: Two times a day (BID) | ORAL | Status: DC
  Filled 2015-12-23 (×2): qty 2

## 2015-12-23 MED ORDER — ABACAVIR-DOLUTEGRAVIR-LAMIVUD 600-50-300 MG PO TABS: 1.0000 | ORAL_TABLET | Freq: Every day | ORAL | Status: DC

## 2015-12-23 MED ORDER — ACETAMINOPHEN 500 MG PO TABS: 1000.0000 mg | ORAL_TABLET | Freq: Four times a day (QID) | ORAL | Status: DC | PRN

## 2015-12-23 MED ORDER — FLUOXETINE HCL 10 MG PO CAPS: 10.0000 mg | ORAL_CAPSULE | Freq: Every day | ORAL | Status: DC

## 2015-12-23 MED ORDER — GABAPENTIN 300 MG PO CAPS: 300.0000 mg | ORAL_CAPSULE | Freq: Three times a day (TID) | ORAL | Status: DC

## 2015-12-23 MED ORDER — NICOTINE 21 MG/24HR TD PT24: 21.0000 mg | MEDICATED_PATCH | Freq: Every day | TRANSDERMAL | Status: DC

## 2015-12-23 MED ORDER — TRAZODONE HCL 150 MG PO TABS
150.0000 mg | ORAL_TABLET | Freq: Every evening | ORAL | Status: DC | PRN
  Filled 2015-12-23: qty 7

## 2015-12-23 MED ORDER — DIVALPROEX SODIUM ER 500 MG PO TB24: 500.0000 mg | ORAL_TABLET | Freq: Two times a day (BID) | ORAL | Status: DC

## 2015-12-23 MED ORDER — BUSPIRONE HCL 10 MG PO TABS: 20.0000 mg | ORAL_TABLET | Freq: Two times a day (BID) | ORAL | Status: DC

## 2015-12-23 MED ORDER — ALBUTEROL SULFATE HFA 108 (90 BASE) MCG/ACT IN AERS: 2.0000 | INHALATION_SPRAY | Freq: Four times a day (QID) | RESPIRATORY_TRACT | Status: DC | PRN

## 2015-12-23 MED ORDER — BUSPIRONE HCL 10 MG PO TABS: 10.0000 mg | ORAL_TABLET | Freq: Two times a day (BID) | ORAL | Status: DC

## 2015-12-23 NOTE — [facility] Group Notes (Signed)
   [facility]  12/23/2015  8:45 AM   Participation Quality: Alert, Appropriate and Oriented  Mood/Affect: Appropriate  Depression Rating: 0  Anxiety Rating: 5  Thoughts of Suicide: Pt denies SI/HI  Will you contract for safety? Yes  Current AVH: Pt denies  Plan for Discharge/Comments: Pt attended discharge planning group and actively participated in group. CSW provided pt with today's workbook. Patient plans to return home to follow up with outpatient services.   Transportation Means: Pt reports access to transportation- requesting bus pass  Supports: No supports mentioned at this time  Samuella BruinKristin Vaidehi Braddy, MSW, Johnson [employer] JohnsonLCSW Clinical Social Worker [facility] [phone]

## 2015-12-23 NOTE — Progress Notes (Signed)
  [facility] :  Will you be returning to the same living situation after discharge:  Yes,  patient plans to return home At discharge, do you have transportation home?: Yes,  patient will be provided with bus pass Do you have the ability to pay for your medications: Yes,  patient will be provided with prescriptions at discharge  Release of information consent forms completed and in the chart;  Patient's signature needed at discharge.  Patient to Follow up at: Follow-up Information    Follow up with [name]   Specialty:  Behavioral Health   Why:  Walk-in clinic Monday-Friday between 8am to 3pm for assessment for therapy and medication management services. Please arrive early and let staff know if you are an established client in order to be seen as quickly as possible.    Contact information:   [address] [place] [address] [phone]       Next level of care provider has access to [facility] Link:no  Safety Planning and Suicide Prevention discussed: Yes,  with patient  Have you used any form of tobacco in the last 30 days? (Cigarettes, Smokeless Tobacco, Cigars, and/or Pipes): Yes  Has patient been referred to the Quitline?: Patient refused referral  Patient has been referred for addiction treatment: Yes  Mette [place], [place] CarboKristin L 12/23/2015, 11:17 AM

## 2015-12-23 NOTE — Progress Notes (Signed)
Recreation Therapy Notes  Date: 03.27.2017 Time: 9:30am Location: [address] Group Room   Group Topic: Stress Management  Goal Area(s) Addresses:  Patient will actively participate in stress management techniques presented during session.   Behavioral Response: Appropriate   Intervention: Stress management techniques  Activity :  Diaphragmatic Breathing and Guided Imagery. LRT provided education, instruction and demonstration on practice of  Diaphragmatic Breathing and Guided Imagery. Patient was asked to participate in technique introduced during session.   Education:  Stress Management, Discharge Planning.   Education Outcome: Acknowledges education  Clinical Observations/Feedback: Patient actively engaged in technique introduced, expressed no concerns and demonstrated ability to practice independently post d/c.   Markevion Lattin L Naiyana Barbian, LRT/CTRS         Demonica Farrey L 12/23/2015 12:55 PM 

## 2015-12-23 NOTE — Progress Notes (Signed)
D:  Patient's self inventory sheet, patient has good sleep, sleep medication is helpful.  Good appetite, high energy level, good concentration.  Denied depression and hopeless, anxiety 5.  Denied withdrawals.  Denied SI.  Denied physical problems.  Denied pain.  Goal is to discharge home.  Plans to be truthful about how he feels.  Does have discharge plans. A:  Medications administered per MD orders.  Emotional support and encouragement given patient. R:  Denied SI and HI, contracts for safety.  Denied A/V hallucinations.  Safety maintained with 15 minute checks.

## 2015-12-23 NOTE — Progress Notes (Signed)
Discharge Note:  Patient discharged home with bus ticket.  Patient denied SI and HI.  Denied A/V hallucinations.  Suicide prevention information given and discussed with patient who stated he understood and had no questions.  Patient stated he received all his belongings, clothing, toiletries, misc items, prescriptions, medications, blue bag, keys, charger, phone, wallet and cards, etc.  All discharge [facility] to [facility].  Patient stated he appreciated all assistance received from [facility] staff.

## 2015-12-23 NOTE — Discharge Summary (Signed)
Physician Discharge Summary Note  Patient:  Edwin Martinez is an 30 y.o., male MRN:  [id number] DOB:  Feb 14, 1986 Patient phone:  [phone] (home)  Patient address:   [address] [place] [address],  Total Time spent with patient: Greater than 30 minutes  Date of Admission:  12/21/2015  Date of Discharge: 12/23/15  Reason for Admission: Worsening symptoms of depression & suicidal ideations.  Principal Problem: Bipolar affect, depressed [facility])  Discharge Diagnoses: Patient Active Problem List   Diagnosis Date Noted  . Marijuana abuse [F12.10]   . Bipolar affective disorder, depressed, moderate degree (HCC) [F31.32] 12/21/2015  . Bipolar affect, depressed (HCC) [F31.30] 12/21/2015  . Suicidal ideation [R45.851] 09/14/2014  . Depression [F32.9]   . Suicidal ideations [R45.851]   . Bipolar I disorder, most recent episode depressed (HCC) [F31.30] 05/24/2014  . GAD (generalized anxiety disorder) [F41.1] 05/24/2014  . MDD (major depressive disorder) (HCC) [F32.9] 05/23/2014  . Suicide attempt (HCC) [T14.91] 05/23/2014  . MDD (major depressive disorder), recurrent episode, severe (HCC) [F33.2] 05/23/2014  . Transaminitis [R74.0] 03/20/2014  . ADHD (attention deficit hyperactivity disorder) [F90.9] 09/12/2012  . INSOMNIA, CHRONIC [G47.00] 05/20/2007  . CARPAL TUNNEL SYNDROME [G56.00] 05/20/2007  . 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 HIV DISEASE [B20] 05/05/2007  . 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  HENT:  Head: Normocephalic.  Eyes: Pupils are equal, round, and reactive to light.  Neck: Normal range of motion.  Cardiovascular: Normal rate and regular rhythm.   Respiratory: Effort normal.  GI:  Soft.  Genitourinary:  Denies any issues  Musculoskeletal: Normal range of motion.  Neurological: He is alert.  Skin: Skin is warm.  Psychiatric: His speech is normal and behavior is normal. Judgment and thought content normal. His mood appears not anxious. His affect is not angry, not blunt, not labile and not inappropriate. Cognition and memory are normal. He does not exhibit a depressed mood.    Review of Systems  Constitutional: Negative.   HENT: Negative.   Eyes: Negative.   Respiratory: Negative.   Cardiovascular: Negative.   Gastrointestinal: Negative.   Genitourinary: Negative.   Musculoskeletal: Negative.   Skin: Negative.   Neurological: Negative.   Endo/Heme/Allergies: Negative.   Psychiatric/Behavioral: Positive for depression (Stable). Negative for suicidal ideas, hallucinations, memory loss and substance abuse. The patient has insomnia (Stable). The patient is not nervous/anxious.     Blood pressure 118/69, pulse 75, temperature 98.4 F (36.9 C), temperature source Oral, resp. rate 18, height 5' 7.25" (1.708 m), weight 70.761 kg (156 lb), SpO2 100 %.Body mass index is 24.26 kg/(m^2).  See Md's SRA   Past Medical History:  Past Medical History  Diagnosis Date  . HIV (human immunodeficiency virus infection) (HCC)   . Bipolar 1 disorder (HCC)   . Schizophrenia (HCC)   . Asthma   . Hypertension     Past Surgical History  Procedure Laterality Date  . Dental surgery     Family History:  Family History  Problem Relation Age of Onset  . Huntington's disease Father   . Heart disease Mother    Social History:  History  Alcohol Use  . 48.0 oz/week  . 80 Standard drinks or equivalent per week    Comment: once week      History  Drug Use  . 2.00 per week  . Special: "Crack" cocaine    Comment: every other day     Social History   Social History  . Marital Status: Single    Spouse Name: N/A  . Number of Children: N/A  . Years of Education: N/A    Social History Main Topics  . Smoking status: Current Every Day Smoker -- 0.50 packs/day    Types: Cigarettes    Start date: 09/29/1991  . Smokeless tobacco: Never Used  . Alcohol Use: 48.0 oz/week    80 Standard drinks or equivalent per week     Comment: once week   . Drug Use: 2.00 per week    Special: "Crack" cocaine     Comment: every other day   . Sexual Activity: Yes    Birth Control/ Protection: Condom   Other Topics Concern  . None   Social History Narrative   Risk to Self: Is patient at risk for suicide?: No What has been your use of drugs/alcohol within the last 12 months?: Alcohol consists of 1 24 9or 40 ounce beer  approximately every other day; THC 2-3 times monthly and Cocaine 2-4 times per year Risk to Others: No Prior Inpatient Therapy: Yes, ([facility] x multiple) Prior Outpatient Therapy: No  Level of Care:  OP  Hospital Course:  Edwin Martinez is an 30 yr old black male admitted to [facility] after presenting to presenting to [facility] with complaints of worsening depression and suicidal ideation with no plan. On unit evaluation: Patient reports that he has had depression for years and was going to [facility] for out patient treatment but has been off of his medication for 1-2 months except for his Depakote and HIV medications. States that his depression started to worsen after the anniversary of his mothers death on January 09, 2023; along with that "I done some crack cocaine; and being off of my medications; I just started feeling depressed, lonely, hopeless, and unworthy. That is how I felt before and I just called to get some help." Patient states that he lives alone in [place] and his family is dead except for a nephew and a brother that is incarcerated. States that his boyfriend "significant other" is very supportive and encouraged him to seek help. Patient denies homicidal ideation, psychosis, and paranoia. At this time he denies suicidal ideation but continues to  endorse worsening depression with hopelessness, unworthiness, and feeling lonely.   While a patient in this hospital, Ananda was evaluated to determine his treatment need. And after evaluation of his symptoms, it was determined the he would need medication regimen for mood stabilization. The medication regimen for his symptoms were discussed & with his consent, initiated. He was then medicated and discharged on; Depakote ER 500 mg for mood stabilization, Gabapentin 300 mg for agitation, Hydroxyzine 25 mg for anxiety & Trazodone 150 mg Q bedtime for sleep. He was enrolled & participated in the group counseling sessions to learn coping skills that should help him cope better & manage his symptoms.   Bader's symptoms responded well to his treatment regimen. This is evidenced by his reports of improved mood & absence of suicidal ideations. He is currently being discharged to continue psychiatric treatment & medication management at the [facility] here in [place], [place]. He is provided with all the necessary information required to contact & make this appointment without problems. Upon discharge, he adamantly denies any SIHI, AVH, delusional thoughts and or paranoia. He received from the  [facility] pharmacy, a 7 days worth, supply samples of his [facility] discharge medications. He left [facility] with all belongings in no distress. Transportation per city bus. [facility] assisted with bus pass.  Consults:  psychiatry  Significant Diagnostic Studies:  labs:  with diff, CMP, UDS, toxicology tests, U/A, reports reviewed, no changes  Discharge Vitals:   Blood pressure 118/69, pulse 75, temperature 98.4 F (36.9 C), temperature source Oral, resp. rate 18, height 5' 7.25" (1.708 m), weight 70.761 kg (156 lb), SpO2 100 %. Body mass index is 24.26 kg/(m^2). Lab Results:   Results for orders placed or performed during the hospital encounter of 12/21/15 (from the past 72 hour(s))  Valproic acid level     Status: Abnormal   Collection  Time: 12/22/15  6:27 PM  Result Value Ref Range   Valproic Acid Lvl 43 (L) 50.0 - 100.0 ug/mL    Comment: Performed at [facility]   Physical Findings: AIMS: Facial and Oral Movements Muscles of Facial Expression: None, normal Lips and Perioral Area: None, normal Jaw: None, normal Tongue: None, normal,Extremity Movements Upper (arms, wrists, hands, fingers): None, normal Lower (legs, knees, ankles, toes): None, normal, Trunk Movements Neck, shoulders, hips: None, normal, Overall Severity Severity of abnormal movements (highest score from questions above): None, normal Incapacitation due to abnormal movements: None, normal Patient's awareness of abnormal movements (rate only patient's report): No Awareness, Dental Status Current problems with teeth and/or dentures?: No Does patient usually wear dentures?: No  CIWA:  CIWA-Ar Total: 1 COWS:  COWS Total Score: 1  See Psychiatric Specialty Exam and Suicide Risk Assessment completed by Attending Physician prior to discharge.  Discharge destination:  Home  Is patient on multiple antipsychotic therapies at discharge:  No   Has Patient had three or more failed trials of antipsychotic monotherapy by history:  No  Recommended Plan for Multiple Antipsychotic Therapies: NA    Medication List    STOP taking these medications        acyclovir 400 MG tablet  Commonly known as:  ZOVIRAX     guaiFENesin 100 MG/5ML liquid  Commonly known as:  ROBITUSSIN      TAKE these medications      Indication   abacavir-dolutegravir-lamiVUDine 600-50-300 MG tablet  Commonly known as:  TRIUMEQ  Take 1 tablet by mouth daily. For HIV infection   Indication:  HIV Disease     acetaminophen 500 MG tablet  Commonly known as:  TYLENOL  Take 2 tablets (1,000 mg total) by mouth every 6 (six) hours as needed for mild pain.   Indication:  Fever, Pain     albuterol 108 (90 Base) MCG/ACT inhaler  Commonly known as:  PROVENTIL  HFA;VENTOLIN HFA  Inhale 2 puffs into the lungs every 6 (six) hours as needed for wheezing or shortness of breath.   Indication:  Asthma, Chronic Obstructive Lung Disease     busPIRone 10 MG tablet  Commonly known as:  BUSPAR  Take 2 tablets (20 mg total) by mouth 2 (two) times daily. For anxiety   Indication:  Generalized Anxiety Disorder     divalproex 500 MG 24 hr tablet  Commonly known as:  DEPAKOTE ER  Take 1 tablet (500 mg total) by mouth 2 (two) times daily. For mood stabilization   Indication:  Mood stabilization     FLUoxetine 10 MG capsule  Commonly known as:  PROZAC  Take 1 capsule (10 mg total) by mouth daily. For depression   Indication:  Major Depressive  Disorder     gabapentin 300 MG capsule  Commonly known as:  NEURONTIN  Take 1 capsule (300 mg total) by mouth 3 (three) times daily. For agitatiom   Indication:  Agitation     hydrOXYzine 25 MG tablet  Commonly known as:  ATARAX/VISTARIL  Take 1 tablet (25 mg total) by mouth 3 (three) times daily as needed for anxiety.   Indication:  Anxiety     nicotine 21 mg/24hr patch  Commonly known as:  NICODERM CQ - dosed in mg/24 hours  Place 1 patch (21 mg total) onto the skin daily. For nicotine addiction   Indication:  Nicotine Addiction     traZODone 150 MG tablet  Commonly known as:  DESYREL  Take 1 tablet (150 mg total) by mouth at bedtime as needed for sleep.   Indication:  Trouble Sleeping       Follow-up Information    [facility]   Specialty:  Behavioral Health   Why:  Walk-in clinic Monday-Friday between 8am to 3pm for assessment for therapy and medication management services. Please arrive early and let staff know if you are an established client in order to be seen as quickly as possible.    Contact informationElpidio Eric:   [address] [place] [address] [phone]     Follow-up recommendations: Activity:  As tolerated Diet: As recommended by your primary care doctor. Keep all scheduled  follow-up appointments as recommended.    Comments: Take all your medications as prescribed by your mental healthcare provider. Report any adverse effects and or reactions from your medicines to your outpatient provider promptly. Patient is instructed and cautioned to not engage in alcohol and or illegal drug use while on prescription medicines. In the event of worsening symptoms, patient is instructed to call the crisis hotline, 911 and or go to the nearest ED for appropriate evaluation and treatment of symptoms. Follow-up with your primary care provider for your other medical issues, concerns and or health care needs.   Total Discharge Time: Greater than 30 minutes  Signed: Sanjuana Kavawoko, Agnes I, PMHNP-Bc 12/23/2015, 2:39 PM  I personally assessed the patient and formulated the plan Madie RenoIrving A. Dub MikesLugo, M.D.

## 2015-12-23 NOTE — [facility] Suicide Risk Assessment (Signed)
[facility]   Principal Problem: Bipolar affect, depressed [facility](HCC) Discharge Diagnoses:  Patient Active Problem List   Diagnosis Date Noted  . Bipolar I disorder, most recent episode depressed (HCC) [F31.30] 05/24/2014    Priority: High  . GAD (generalized anxiety disorder) [F41.1] 05/24/2014    Priority: High  . MDD (major depressive disorder), recurrent episode, severe (HCC) [F33.2] 05/23/2014    Priority: High  . Marijuana abuse [F12.10]   . Bipolar affective disorder, depressed, moderate degree (HCC) [F31.32] 12/21/2015  . Bipolar affect, depressed (HCC) [F31.30] 12/21/2015  . Suicidal ideation [R45.851] 09/14/2014  . Depression [F32.9]   . Suicidal ideations [R45.851]   . MDD (major depressive disorder) (HCC) [F32.9] 05/23/2014  . Suicide attempt (HCC) [T14.91] 05/23/2014  . Transaminitis [R74.0] 03/20/2014  . ADHD (attention deficit hyperactivity disorder) [F90.9] 09/12/2012  . INSOMNIA, CHRONIC [G47.00] 05/20/2007  . CARPAL TUNNEL SYNDROME [G56.00] 05/20/2007  . 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 HIV DISEASE [B20] 05/05/2007  . 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   HENT: Negative.   Eyes: Negative.   Respiratory: Negative.   Cardiovascular: Negative.   Gastrointestinal: Negative.   Genitourinary: Negative.   Musculoskeletal: Negative.   Skin: Negative.   Neurological: Negative.   Endo/Heme/Allergies: Negative.   Psychiatric/Behavioral: The patient is nervous/anxious.     Blood pressure 118/69, pulse 75, temperature 98.4 F (36.9 C), temperature source Oral, resp. rate 18, height 5' 7.25" (1.708 m), weight 70.761 kg (156 lb), SpO2 100 %.Body mass index is  24.26 kg/(m^2).  General Appearance: Fairly Groomed  Patent attorneyye Contact::  Fair  Speech:  Clear and Coherent409  Volume:  Normal  Mood:  Euthymic  Affect:  Appropriate  Thought Process:  Coherent and Goal Directed  Orientation:  Full (Time, Place, and Person)  Thought Content:  plans as he moves on  Suicidal Thoughts:  No  Homicidal Thoughts:  No  Memory:  Immediate;   Fair Recent;   Fair Remote;   Fair  Judgement:  Fair  Insight:  Present  Psychomotor Activity:  Normal  Concentration:  Fair  Recall:  FiservFair  Fund of Knowledge:Fair  Language: Fair  Akathisia:  No  Handed:  Right  AIMS (if indicated):     Assets:  Communication Skills Desire for Improvement Housing Social Support  Sleep:  Number of Hours: 5.75  Cognition: WNL  ADL's:  Intact  In full contact with reality. There are no active SI plans or intent. [facility]. He is committed to continue taking them Mental Status Per Nursing Assessment::   On Admission:  Suicidal ideation indicated by patient  Demographic Factors:  Male  Loss Factors: none identified  Historical Factors: Family history of mental illness or substance abuse  Risk Reduction Factors:   Living with another person, especially a relative and Positive social support  Continued Clinical Symptoms:  Depression:   Insomnia  Cognitive Features That Contribute To Risk:  None    Suicide Risk:  Minimal: No identifiable suicidal ideation.  Patients presenting with no risk factors but with morbid ruminations; may be classified as minimal risk based on the severity of the depressive symptoms    Plan Of Care/Follow-up recommendations:  Activity:  as tolerated Diet:  regular Follow up as above Vin Yonke A, MD 12/23/2015, 10:18 AM

## 2015-12-23 NOTE — Tx Team (Signed)
Interdisciplinary Treatment Plan Update (Adult) Date: 12/23/2015    Time Reviewed: 9:30 AM  Progress in Treatment: Attending groups: Intermittently Participating in groups: Yes, when he attends Taking medication as prescribed: Yes Tolerating medication: Yes Family/Significant other contact made: No, patient has declined for collateral contact Patient understands diagnosis: Yes Discussing patient identified problems/goals with staff: Yes Medical problems stabilized or resolved: Yes Denies suicidal/homicidal ideation: Yes Issues/concerns per patient self-inventory: Yes Other:  New problem(s) identified: N/A  Discharge Plan or Barriers: Home with outpatient services.   Reason for Continuation of Hospitalization:  Depression Anxiety Medication Stabilization   Comments: N/A  Estimated length of stay: Discharge anticipated for today 12/23/15    Patient is a 29 YO African American unemployed male admitted to [facility] and reports primary trigger for admission was noncompliance with medication as he failed to get his prescriptions rewritten as he did not return to [name]. Patient will benefit from crisis stabilization, medication evaluation, group therapy and psycho education, in addition to case management for discharge planning. At discharge it is recommended that patient adhere to the established discharge plan and continue in treatment.    Review of initial/current patient goals per problem list:  1. Goal(s): Patient will participate in aftercare plan   Met: Yes   Target date: 3-5 days post admission date   As evidenced by: Patient will participate within aftercare plan AEB aftercare provider and housing plan at discharge being identified.  3/27: Goal met. Patient plans to return home to follow up with outpatient services.    2. Goal (s): Patient will exhibit decreased depressive symptoms and suicidal ideations.   Met: Yes   Target date: 3-5 days post admission  date   As evidenced by: Patient will utilize self rating of depression at 3 or below and demonstrate decreased signs of depression or be deemed stable for discharge by MD.  3/27: Goal met. Patient rates depression 0, denies SI.   3. Goal(s): Patient will demonstrate decreased signs and symptoms of anxiety.   Met: Adequate for discharge per MD   Target date: 3-5 days post admission date   As evidenced by: Patient will utilize self rating of anxiety at 3 or below and demonstrated decreased signs of anxiety, or be deemed stable for discharge by MD   3/27: Adequate for discharge. Patient rates anxiety at 5 related to wanting to discharge home today.   4. Goal(s): Patient will demonstrate decreased signs of withdrawal due to substance abuse   Met: Adequate for discharge per MD   Target date: 3-5 days post admission date   As evidenced by: Patient will produce a CIWA/COWS score of 0, have stable vitals signs, and no symptoms of withdrawal  3/27: Adequate for discharge. Patient with COWS score of 1, experiencing anxiety.     Attendees: Patient:    Family:    Physician: Dr. Cobos; Dr. Lugo 12/23/2015 9:30 AM  Nursing:  Beverly Knight, Caroline Beaudry, Christa Dobson, Janet, RN 12/23/2015 9:30 AM  Clinical Social Worker:  , LCSW 12/23/2015 9:30 AM  Other: Lauren Carter, LCSWA; Heather Smart, LCSW  12/23/2015 9:30 AM  Other: Delora Sutton, P4CC 12/23/2015 9:30 AM  Other: Jennifer Clark, Case Manager 12/23/2015 9:30 AM  Other: Aggie Nwoko, May Augustin, NP 12/23/2015 9:30 AM  Other:     Scribe for Treatment Team:   , LCSW [phone]     

## 2015-12-23 NOTE — [facility] Suicide Risk Assessment (Signed)
[facility] INPATIENT:  Family/Significant Other Suicide Prevention Education  Suicide Prevention Education:  Patient Refusal for Family/Significant Other Suicide Prevention Education: The patient Edwin Martinez has refused to provide written consent for family/significant other to be provided Family/Significant Other Suicide Prevention Education during admission and/or prior to discharge.  Physician notified. SPE reviewed with patient and brochure provided. Patient encouraged to return to hospital if having suicidal thoughts, patient verbalized his/her understanding and has no further questions at this time.   Dalylah Ramey, West CarboKristin L 12/23/2015, 11:11 AM

## 2015-12-23 NOTE — BHH Counselor (Signed)
Adult Comprehensive Assessment  Patient ID: Edwin Martinez A Sullenberger, male   DOB: [birth year], 30 y.o.   MRN: [id number]  Information Source: Information source: Patient  Current Stressors:  Educational / Learning stressors: 9th grade education Employment / Job issues: Unemployed; has applied for disability recently Family Relationships: All deceased except brother Surveyor, quantityinancial / Lack of resources (include bankruptcy): [employer] Housing / Lack of housing: NA Physical health (include injuries & life threatening diseases): HIV; non compliant with psych meds as he ran out Social relationships: NA Substance abuse: NA Bereavement / Loss: Mother 12/16/15 (became distraught on anniversary recently)  Living/Environment/Situation:  Living Arrangements: Spouse/significant other Living conditions (as described by patient or guardian): Stable home of 7 uyears  How long has patient lived in current situation?: 7 years What is atmosphere in current home: Comfortable, Supportive, Loving  Family History:  Marital status: Long term relationship Long term relationship, how long?: 7 years What types of issues is patient dealing with in the relationship?: NA Additional relationship information: Stable relationship of over 7 years Does patient have children?: No  Childhood History:  By whom was/is the patient raised?: Mother/father and step-parent Additional childhood history information: Patient reports having a good childhood Description of patient's relationship with caregiver when they were a child: Good relationship with parents as a child Patient's description of current relationship with people who raised him/her: Both deceased Does patient have siblings?: Yes Number of Siblings: 1 Description of patient's current relationship with siblings: Good; close relationship that is supportive Did patient suffer any verbal/emotional/physical/sexual abuse as a child?: No Did patient suffer from severe childhood  neglect?: No Has patient ever been sexually abused/assaulted/raped as an adolescent or adult?: No Was the patient ever a victim of a crime or a disaster?: No Witnessed domestic violence?: No Has patient been effected by domestic violence as an adult?: No  Education:  Highest grade of school patient has completed: 9th Currently a student?: No Learning disability?: No  Employment/Work Situation:   Employment situation: Unemployed Patient's job has been impacted by current illness: No What is the longest time patient has a held a job?: Six months Where was the patient employed at that time?: [employer] Has patient ever served in Buyer, retailcombat?: No  Financial Resources:   Surveyor, quantityinancial resources: Sales executiveood stamps, Income from spouse (And [facility] in addition to Section 8 housing allowance and allowance for utilities) Does patient have a representative payee or guardian?: No  Alcohol/Substance Abuse:   What has been your use of drugs/alcohol within the last 12 months?: Alcohol consists of 1 24 9or 40 ounce beer  approximately every other day; THC 2-3 times monthly and Cocaine 2-4 times per year Alcohol/Substance Abuse Treatment Hx: Denies past history Has alcohol/substance abuse ever caused legal problems?: No (Patient does have court case on 01/24/16 for [employer] larceny)  Social Support System:   Patient's Community Support System: Good Describe Community Support System: SO, sibling, neighbors and friends Type of faith/religion: Ephriam KnucklesChristian How does patient's faith help to cope with current illness?: Prayer and hope  Leisure/Recreation:   Leisure and Hobbies: Social research officer, governmentGardening, cooking, poetry and singing  Strengths/Needs:   What things does the patient do well?: health maintenance, relationship, good attitude, helping others In what areas does patient struggle / problems for patient: Maintaining regualr schedule with [facility]  Discharge Plan:   Plan for no access to  transportation at discharge: [employer] Will patient be returning to same living situation after discharge?: Yes Currently receiving community mental health  services: No (He is "registered at [facility]"  yet needs to go for medication mgt) If no, would patient like referral for services when discharged?:  ([place]) Does patient have financial barriers related to discharge medications?: No  Summary/Recommendations:   Summary and Recommendations (to be completed by the evaluator): Patient is a 30 YO African American unemployed  male admitted to [facility] and reports primary trigger for admission was noncompliance with medication as he failed to get his prescriptions rewritten as he did not return to [place]. Patient will benefit from crisis stabilization, medication evaluation, group therapy and psycho education, in addition to case management for discharge planning. At discharge it is recommended that patient adhere to the established discharge plan and continue in treatment.   Ambur Province, [place] 12/23/2015

## 2016-01-14 ENCOUNTER — Ambulatory Visit: Payer: Self-pay

## 2016-01-21 ENCOUNTER — Other Ambulatory Visit: Payer: Self-pay | Admitting: *Deleted

## 2016-01-21 DIAGNOSIS — B20 Human immunodeficiency virus [HIV] disease: Secondary | ICD-10-CM

## 2016-01-21 MED ORDER — ABACAVIR-DOLUTEGRAVIR-LAMIVUD 600-50-300 MG PO TABS: 1.0000 | ORAL_TABLET | Freq: Every day | ORAL | Status: DC

## 2016-02-03 ENCOUNTER — Encounter: Payer: Self-pay | Admitting: Internal Medicine

## 2016-02-07 ENCOUNTER — Other Ambulatory Visit: Payer: Self-pay | Admitting: Internal Medicine

## 2016-02-13 ENCOUNTER — Encounter (HOSPITAL_COMMUNITY): Payer: Self-pay | Admitting: Neurology

## 2016-02-13 ENCOUNTER — Emergency Department (HOSPITAL_COMMUNITY): Payer: Self-pay

## 2016-02-13 ENCOUNTER — Emergency Department (HOSPITAL_COMMUNITY)
Admission: EM | Admit: 2016-02-13 | Discharge: 2016-02-13 | Disposition: A | Discharge status: Home / Self Care | Payer: Self-pay | Attending: Emergency Medicine | Admitting: Emergency Medicine

## 2016-02-13 DIAGNOSIS — M25562 Pain in left knee: Secondary | ICD-10-CM | POA: Insufficient documentation

## 2016-02-13 DIAGNOSIS — J45909 Unspecified asthma, uncomplicated: Secondary | ICD-10-CM | POA: Insufficient documentation

## 2016-02-13 DIAGNOSIS — M25512 Pain in left shoulder: Secondary | ICD-10-CM | POA: Insufficient documentation

## 2016-02-13 DIAGNOSIS — R55 Syncope and collapse: Secondary | ICD-10-CM | POA: Insufficient documentation

## 2016-02-13 DIAGNOSIS — Y9301 Activity, walking, marching and hiking: Secondary | ICD-10-CM | POA: Insufficient documentation

## 2016-02-13 DIAGNOSIS — Z91010 Allergy to peanuts: Secondary | ICD-10-CM | POA: Insufficient documentation

## 2016-02-13 DIAGNOSIS — Y92410 Unspecified street and highway as the place of occurrence of the external cause: Secondary | ICD-10-CM | POA: Insufficient documentation

## 2016-02-13 DIAGNOSIS — Y999 Unspecified external cause status: Secondary | ICD-10-CM | POA: Insufficient documentation

## 2016-02-13 DIAGNOSIS — F17200 Nicotine dependence, unspecified, uncomplicated: Secondary | ICD-10-CM | POA: Insufficient documentation

## 2016-02-13 HISTORY — DX: Unspecified convulsions: R56.9

## 2016-02-13 HISTORY — DX: Anxiety disorder, unspecified: F41.9

## 2016-02-13 LAB — I-STAT CHEM 8, ED
BUN: 12 mg/dL (ref 6–20)
CALCIUM ION: 1.12 mmol/L (ref 1.12–1.23)
CHLORIDE: 103 mmol/L (ref 101–111)
CREATININE: 1.1 mg/dL (ref 0.61–1.24)
Glucose, Bld: 61 mg/dL — ABNORMAL LOW (ref 65–99)
HEMATOCRIT: 45 % (ref 39.0–52.0)
Hemoglobin: 15.3 g/dL (ref 13.0–17.0)
Potassium: 3.9 mmol/L (ref 3.5–5.1)
SODIUM: 140 mmol/L (ref 135–145)
TCO2: 21 mmol/L (ref 0–100)

## 2016-02-13 LAB — CBC
HCT: 41.3 % (ref 39.0–52.0)
Hemoglobin: 13.8 g/dL (ref 13.0–17.0)
MCH: 29.2 pg (ref 26.0–34.0)
MCHC: 33.4 g/dL (ref 30.0–36.0)
MCV: 87.3 fL (ref 78.0–100.0)
Platelets: 254 10*3/uL (ref 150–400)
RBC: 4.73 MIL/uL (ref 4.22–5.81)
RDW: 14 % (ref 11.5–15.5)
WBC: 7.8 10*3/uL (ref 4.0–10.5)

## 2016-02-13 LAB — COMPREHENSIVE METABOLIC PANEL
ALBUMIN: 4.1 g/dL (ref 3.5–5.0)
ALK PHOS: 46 U/L (ref 38–126)
ALT: 18 U/L (ref 17–63)
ANION GAP: 15 (ref 5–15)
AST: 27 U/L (ref 15–41)
BILIRUBIN TOTAL: 0.5 mg/dL (ref 0.3–1.2)
BUN: 10 mg/dL (ref 6–20)
CALCIUM: 9.4 mg/dL (ref 8.9–10.3)
CO2: 21 mmol/L — ABNORMAL LOW (ref 22–32)
Chloride: 102 mmol/L (ref 101–111)
Creatinine, Ser: 0.98 mg/dL (ref 0.61–1.24)
GFR calc Af Amer: >60 mL/min (ref >60)
GFR calc non Af Amer: >60 mL/min (ref >60)
GLUCOSE: 69 mg/dL (ref 65–99)
Potassium: 3.9 mmol/L (ref 3.5–5.1)
Sodium: 138 mmol/L (ref 135–145)
TOTAL PROTEIN: 7.1 g/dL (ref 6.5–8.1)

## 2016-02-13 LAB — SAMPLE TO BLOOD BANK

## 2016-02-13 LAB — I-STAT CG4 LACTIC ACID, ED: LACTIC ACID, VENOUS: 2.85 mmol/L — AB (ref 0.5–2.0)

## 2016-02-13 LAB — PROTIME-INR
INR: 1.24 (ref 0.00–1.49)
Prothrombin Time: 15.8 seconds — ABNORMAL HIGH (ref 11.6–15.2)

## 2016-02-13 LAB — CDS SEROLOGY

## 2016-02-13 LAB — ETHANOL: ALCOHOL ETHYL (B): 72 mg/dL — AB (ref <5)

## 2016-02-13 MED ORDER — TETANUS-DIPHTH-ACELL PERTUSSIS 5-2.5-18.5 LF-MCG/0.5 IM SUSP
0.5000 mL | Freq: Once | INTRAMUSCULAR | Status: AC
  Administered 2016-02-13: 0.5 mL via INTRAMUSCULAR
  Filled 2016-02-13: qty 0.5

## 2016-02-13 MED ORDER — MORPHINE SULFATE (PF) 4 MG/ML IV SOLN
4.0000 mg | Freq: Once | INTRAVENOUS | Status: AC
  Administered 2016-02-13: 4 mg via INTRAVENOUS
  Filled 2016-02-13: qty 1

## 2016-02-13 MED ORDER — ONDANSETRON HCL 4 MG/2ML IJ SOLN
4.0000 mg | Freq: Once | INTRAMUSCULAR | Status: AC
  Administered 2016-02-13: 4 mg via INTRAVENOUS
  Filled 2016-02-13: qty 2
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
## 2016-02-13 NOTE — Discharge Instructions (Signed)
Take 4 over the counter ibuprofen tablets 3 times a day or 2 over-the-counter naproxen tablets twice a day for pain. ° °
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
## 2016-02-14 ENCOUNTER — Encounter (HOSPITAL_COMMUNITY): Payer: Self-pay | Admitting: Registered Nurse

## 2016-03-07 ENCOUNTER — Encounter (HOSPITAL_COMMUNITY): Payer: Self-pay

## 2016-03-07 ENCOUNTER — Emergency Department (HOSPITAL_COMMUNITY)
Admission: EM | Admit: 2016-03-07 | Discharge: 2016-03-08 | Disposition: A | Discharge status: Other Acute Inpatient Hospital | Payer: Self-pay | Attending: Emergency Medicine | Admitting: Emergency Medicine

## 2016-03-07 DIAGNOSIS — Z79899 Other long term (current) drug therapy: Secondary | ICD-10-CM | POA: Insufficient documentation

## 2016-03-07 DIAGNOSIS — T43222A Poisoning by selective serotonin reuptake inhibitors, intentional self-harm, initial encounter: Secondary | ICD-10-CM | POA: Insufficient documentation

## 2016-03-07 DIAGNOSIS — Y999 Unspecified external cause status: Secondary | ICD-10-CM | POA: Insufficient documentation

## 2016-03-07 DIAGNOSIS — T50912A Poisoning by multiple unspecified drugs, medicaments and biological substances, intentional self-harm, initial encounter: Secondary | ICD-10-CM

## 2016-03-07 DIAGNOSIS — Y929 Unspecified place or not applicable: Secondary | ICD-10-CM | POA: Insufficient documentation

## 2016-03-07 DIAGNOSIS — F333 Major depressive disorder, recurrent, severe with psychotic symptoms: Secondary | ICD-10-CM | POA: Insufficient documentation

## 2016-03-07 DIAGNOSIS — Y939 Activity, unspecified: Secondary | ICD-10-CM | POA: Insufficient documentation

## 2016-03-07 DIAGNOSIS — J45909 Unspecified asthma, uncomplicated: Secondary | ICD-10-CM | POA: Insufficient documentation

## 2016-03-07 DIAGNOSIS — Z91010 Allergy to peanuts: Secondary | ICD-10-CM | POA: Insufficient documentation

## 2016-03-07 DIAGNOSIS — T426X2A Poisoning by other antiepileptic and sedative-hypnotic drugs, intentional self-harm, initial encounter: Secondary | ICD-10-CM | POA: Insufficient documentation

## 2016-03-07 DIAGNOSIS — I10 Essential (primary) hypertension: Secondary | ICD-10-CM | POA: Insufficient documentation

## 2016-03-07 DIAGNOSIS — F17210 Nicotine dependence, cigarettes, uncomplicated: Secondary | ICD-10-CM | POA: Insufficient documentation

## 2016-03-07 LAB — URINALYSIS, ROUTINE W REFLEX MICROSCOPIC
BILIRUBIN URINE: NEGATIVE
Glucose, UA: NEGATIVE mg/dL
HGB URINE DIPSTICK: NEGATIVE
Ketones, ur: NEGATIVE mg/dL
Leukocytes, UA: NEGATIVE
Nitrite: NEGATIVE
PH: 6.5 (ref 5.0–8.0)
Protein, ur: NEGATIVE mg/dL
SPECIFIC GRAVITY, URINE: 1.01 (ref 1.005–1.030)

## 2016-03-07 LAB — CBC WITH DIFFERENTIAL/PLATELET
BASOS ABS: 0 10*3/uL (ref 0.0–0.1)
BASOS PCT: 1 %
Eosinophils Absolute: 0.1 10*3/uL (ref 0.0–0.7)
Eosinophils Relative: 1 %
HEMATOCRIT: 40.4 % (ref 39.0–52.0)
HEMOGLOBIN: 13.5 g/dL (ref 13.0–17.0)
Lymphocytes Relative: 35 %
Lymphs Abs: 3 10*3/uL (ref 0.7–4.0)
MCH: 29.7 pg (ref 26.0–34.0)
MCHC: 33.4 g/dL (ref 30.0–36.0)
MCV: 88.8 fL (ref 78.0–100.0)
Monocytes Absolute: 0.8 10*3/uL (ref 0.1–1.0)
Monocytes Relative: 9 %
NEUTROS ABS: 4.7 10*3/uL (ref 1.7–7.7)
NEUTROS PCT: 54 %
Platelets: 233 10*3/uL (ref 150–400)
RBC: 4.55 MIL/uL (ref 4.22–5.81)
RDW: 14.4 % (ref 11.5–15.5)
WBC: 8.6 10*3/uL (ref 4.0–10.5)

## 2016-03-07 LAB — COMPREHENSIVE METABOLIC PANEL
ALBUMIN: 4.2 g/dL (ref 3.5–5.0)
ALT: 20 U/L (ref 17–63)
ANION GAP: 12 (ref 5–15)
AST: 35 U/L (ref 15–41)
Alkaline Phosphatase: 51 U/L (ref 38–126)
BILIRUBIN TOTAL: 0.4 mg/dL (ref 0.3–1.2)
BUN: 13 mg/dL (ref 6–20)
CO2: 22 mmol/L (ref 22–32)
Calcium: 9.1 mg/dL (ref 8.9–10.3)
Chloride: 105 mmol/L (ref 101–111)
Creatinine, Ser: 0.81 mg/dL (ref 0.61–1.24)
GFR calc Af Amer: >60 mL/min (ref >60)
GLUCOSE: 88 mg/dL (ref 65–99)
POTASSIUM: 3.7 mmol/L (ref 3.5–5.1)
Sodium: 139 mmol/L (ref 135–145)
TOTAL PROTEIN: 7 g/dL (ref 6.5–8.1)

## 2016-03-07 LAB — ACETAMINOPHEN LEVEL

## 2016-03-07 LAB — RAPID URINE DRUG SCREEN, HOSP PERFORMED
AMPHETAMINES: NOT DETECTED
BENZODIAZEPINES: NOT DETECTED
Barbiturates: NOT DETECTED
Cocaine: POSITIVE — AB
OPIATES: NOT DETECTED
Tetrahydrocannabinol: POSITIVE — AB

## 2016-03-07 LAB — VALPROIC ACID LEVEL
Valproic Acid Lvl: 115 ug/mL — ABNORMAL HIGH (ref 50.0–100.0)
Valproic Acid Lvl: 59 ug/mL (ref 50.0–100.0)

## 2016-03-07 LAB — ETHANOL: ALCOHOL ETHYL (B): 51 mg/dL — AB (ref <5)

## 2016-03-07 LAB — SALICYLATE LEVEL: Salicylate Lvl: <4 mg/dL (ref 2.8–30.0)

## 2016-03-07 MED ORDER — ONDANSETRON HCL 4 MG PO TABS
4.0000 mg | ORAL_TABLET | Freq: Three times a day (TID) | ORAL | Status: DC | PRN
Start: 2016-03-07 — End: 2016-03-08

## 2016-03-07 MED ORDER — SODIUM CHLORIDE 0.9 % IV SOLN
1000.0000 mL | Freq: Once | INTRAVENOUS | Status: AC
  Administered 2016-03-07: 1000 mL via INTRAVENOUS

## 2016-03-07 MED ORDER — LORAZEPAM 1 MG PO TABS: 1.0000 mg | ORAL_TABLET | Freq: Three times a day (TID) | ORAL | Status: DC | PRN

## 2016-03-07 MED ORDER — SODIUM CHLORIDE 0.9 % IV SOLN
1000.0000 mL | INTRAVENOUS | Status: DC
  Administered 2016-03-07: 1000 mL via INTRAVENOUS

## 2016-03-07 MED ORDER — ACETAMINOPHEN 325 MG PO TABS: 650.0000 mg | ORAL_TABLET | ORAL | Status: DC | PRN

## 2016-03-07 MED ORDER — GABAPENTIN 300 MG PO CAPS: 300.0000 mg | ORAL_CAPSULE | Freq: Three times a day (TID) | ORAL | Status: DC

## 2016-03-07 MED ORDER — ALBUTEROL SULFATE HFA 108 (90 BASE) MCG/ACT IN AERS: 2.0000 | INHALATION_SPRAY | Freq: Four times a day (QID) | RESPIRATORY_TRACT | Status: DC | PRN

## 2016-03-07 MED ORDER — ABACAVIR-DOLUTEGRAVIR-LAMIVUD 600-50-300 MG PO TABS
1.0000 | ORAL_TABLET | Freq: Every day | ORAL | Status: DC
  Administered 2016-03-07: 1 via ORAL
  Filled 2016-03-07 (×2): qty 1

## 2016-03-07 MED ORDER — NICOTINE 21 MG/24HR TD PT24: 21.0000 mg | MEDICATED_PATCH | Freq: Every day | TRANSDERMAL | Status: DC

## 2016-03-07 MED ORDER — DIVALPROEX SODIUM ER 500 MG PO TB24: 500.0000 mg | ORAL_TABLET | Freq: Two times a day (BID) | ORAL | Status: DC

## 2016-03-07 MED ORDER — TRAZODONE HCL 50 MG PO TABS: 150.0000 mg | ORAL_TABLET | Freq: Every evening | ORAL | Status: DC | PRN

## 2016-03-07 MED ORDER — IBUPROFEN 200 MG PO TABS
600.0000 mg | ORAL_TABLET | Freq: Three times a day (TID) | ORAL | Status: DC | PRN
  Administered 2016-03-07: 600 mg via ORAL
  Filled 2016-03-07: qty 3

## 2016-03-07 MED ORDER — ZOLPIDEM TARTRATE 5 MG PO TABS: 5.0000 mg | ORAL_TABLET | Freq: Every evening | ORAL | Status: DC | PRN

## 2016-03-07 MED ORDER — NICOTINE 7 MG/24HR TD PT24
7.0000 mg | MEDICATED_PATCH | Freq: Every day | TRANSDERMAL | Status: DC
  Filled 2016-03-07: qty 1

## 2016-03-07 MED ORDER — FLUOXETINE HCL 10 MG PO CAPS: 10.0000 mg | ORAL_CAPSULE | Freq: Every day | ORAL | Status: DC

## 2016-03-07 MED ORDER — ONDANSETRON HCL 4 MG/2ML IJ SOLN
4.0000 mg | Freq: Once | INTRAMUSCULAR | Status: AC
  Administered 2016-03-07: 4 mg via INTRAVENOUS
  Filled 2016-03-07: qty 2

## 2016-03-07 MED ORDER — BUSPIRONE HCL 10 MG PO TABS
20.0000 mg | ORAL_TABLET | Freq: Two times a day (BID) | ORAL | Status: DC
  Administered 2016-03-07 (×2): 20 mg via ORAL
  Filled 2016-03-07 (×2): qty 2

## 2016-03-07 NOTE — ED Notes (Signed)
Patient noted in room. No complaints, stable, in no acute distress. Q15 minute rounds and monitoring via Security Cameras to continue.  

## 2016-03-07 NOTE — ED Notes (Addendum)
[place], MD states QTc length is sufficient to be removed from cardiac monitor.

## 2016-03-07 NOTE — Progress Notes (Addendum)
This Clinical research associatewriter was informed by Consulting civil engineerCharge RN the patient is not medically cleared at this time and will need cardiac monitoring until 3:00pm.  This will check back at that time.     Maryelizabeth Rowanressa Nyxon Strupp, MSW, Clare CharonLCSW, LCAS [facility] Triage Specialist [phone]

## 2016-03-07 NOTE — ED Notes (Signed)
Poison control updated-case closed 

## 2016-03-07 NOTE — ED Provider Notes (Signed)
Repeat valproic acid level recommended by Poison Control.  Level is decreasing.  VSS.  Awaiting psychiatric evaluation/dispo  Linwood DibblesJon Kieran Nachtigal, MD 03/07/16 1229

## 2016-03-07 NOTE — Progress Notes (Signed)
Per Dr. Lolly MustacheArfeen this writer spoke with Nada BoozerSusan, Charge RN to discontinue the TTS consult until the patient is medically cleared.     Maryelizabeth Rowanressa Satara Virella, MSW, Clare CharonLCSW, LCAS [facility] Triage Specialist [phone]

## 2016-03-07 NOTE — ED Notes (Signed)
Poison control called to check pt status. Labs values and plan of care were reported. PC to call back this afternoon to follow-up with pt
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
## 2016-03-07 NOTE — ED Notes (Signed)
Patient noted sleeping in room. No complaints, stable, in no acute distress. Q15 minute rounds and monitoring via Security Cameras to continue.  
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
## 2016-03-07 NOTE — ED Notes (Signed)
Pt vomited approximately 

## 2016-03-07 NOTE — ED Notes (Signed)
On the phone 

## 2016-03-07 NOTE — ED Notes (Addendum)
Poison Control contacted. Recommendations include: Check Valproic Acid Level and Recheck q 6 hours Observe for 12-18 hours Cardiac monitoring for possible qtc prolongation Monitor for CNS and Respiratory Depression Rehydrate with IV Fluids Consider Activated Charcoal if pt is awake and alert enough to swallow, per MD's discretion  MD Preston FleetingGlick notified of recommendations
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
## 2016-03-07 NOTE — ED Notes (Addendum)
Pt ambulatory w/o difficulty from ED, pt up on the phone on arrival to unit

## 2016-03-07 NOTE — ED Notes (Signed)
Bed: [id number] Expected date:  Expected time:  Means of arrival:  Comments: Room 16 

## 2016-03-07 NOTE — ED Notes (Signed)
Report received from Janie Rambo RN. Patient alert and oriented, warm and dry, in no acute distress. Patient denies SI, HI, AVH and pain. Patient made aware of Q15 minute rounds and security cameras for their safety. Patient instructed to come to me with needs or concerns.  

## 2016-03-07 NOTE — Progress Notes (Signed)
[facility] Dr. Jama Flavorsobos.  Call report to [phone].  Call for admission ETA after shift change.      Maryelizabeth Rowanressa Ben Habermann, MSW, Clare CharonLCSW, LCAS [facility] Triage Specialist [phone]

## 2016-03-07 NOTE — ED Notes (Signed)
Pt. Stated that he had taken " at least 70 pills".
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
## 2016-03-08 ENCOUNTER — Inpatient Hospital Stay (HOSPITAL_COMMUNITY)
Admission: AD | Admit: 2016-03-08 | Discharge: 2016-03-10 | DRG: 885 | Disposition: A | Discharge status: Home / Self Care | Payer: No Typology Code available for payment source | Source: Intra-hospital | Attending: Psychiatry | Admitting: Psychiatry

## 2016-03-08 ENCOUNTER — Encounter (HOSPITAL_COMMUNITY): Payer: Self-pay | Admitting: *Deleted

## 2016-03-08 DIAGNOSIS — J45909 Unspecified asthma, uncomplicated: Secondary | ICD-10-CM | POA: Diagnosis present

## 2016-03-08 DIAGNOSIS — B20 Human immunodeficiency virus [HIV] disease: Secondary | ICD-10-CM | POA: Diagnosis present

## 2016-03-08 DIAGNOSIS — F1910 Other psychoactive substance abuse, uncomplicated: Secondary | ICD-10-CM | POA: Diagnosis not present

## 2016-03-08 DIAGNOSIS — F1414 Cocaine abuse with cocaine-induced mood disorder: Secondary | ICD-10-CM | POA: Diagnosis not present

## 2016-03-08 DIAGNOSIS — F410 Panic disorder [episodic paroxysmal anxiety] without agoraphobia: Secondary | ICD-10-CM | POA: Diagnosis present

## 2016-03-08 DIAGNOSIS — F411 Generalized anxiety disorder: Secondary | ICD-10-CM | POA: Diagnosis present

## 2016-03-08 DIAGNOSIS — G4700 Insomnia, unspecified: Secondary | ICD-10-CM | POA: Diagnosis present

## 2016-03-08 DIAGNOSIS — F3130 Bipolar disorder, current episode depressed, mild or moderate severity, unspecified: Secondary | ICD-10-CM

## 2016-03-08 DIAGNOSIS — F17210 Nicotine dependence, cigarettes, uncomplicated: Secondary | ICD-10-CM | POA: Diagnosis present

## 2016-03-08 DIAGNOSIS — Z79899 Other long term (current) drug therapy: Secondary | ICD-10-CM

## 2016-03-08 DIAGNOSIS — F1210 Cannabis abuse, uncomplicated: Secondary | ICD-10-CM | POA: Diagnosis present

## 2016-03-08 DIAGNOSIS — F3163 Bipolar disorder, current episode mixed, severe, without psychotic features: Principal | ICD-10-CM | POA: Diagnosis present

## 2016-03-08 DIAGNOSIS — F909 Attention-deficit hyperactivity disorder, unspecified type: Secondary | ICD-10-CM | POA: Diagnosis present

## 2016-03-08 DIAGNOSIS — Z915 Personal history of self-harm: Secondary | ICD-10-CM | POA: Diagnosis not present

## 2016-03-08 DIAGNOSIS — G40909 Epilepsy, unspecified, not intractable, without status epilepticus: Secondary | ICD-10-CM | POA: Diagnosis present

## 2016-03-08 DIAGNOSIS — F209 Schizophrenia, unspecified: Secondary | ICD-10-CM | POA: Diagnosis present

## 2016-03-08 DIAGNOSIS — I10 Essential (primary) hypertension: Secondary | ICD-10-CM | POA: Diagnosis present

## 2016-03-08 DIAGNOSIS — Z8249 Family history of ischemic heart disease and other diseases of the circulatory system: Secondary | ICD-10-CM

## 2016-03-08 MED ORDER — TRAZODONE HCL 150 MG PO TABS
150.0000 mg | ORAL_TABLET | Freq: Every evening | ORAL | Status: DC | PRN
  Administered 2016-03-09: 150 mg via ORAL
  Filled 2016-03-08: qty 1
  Filled 2016-03-08: qty 7

## 2016-03-08 MED ORDER — BUSPIRONE HCL 10 MG PO TABS
20.0000 mg | ORAL_TABLET | Freq: Two times a day (BID) | ORAL | Status: DC
  Administered 2016-03-08 – 2016-03-10 (×4): 20 mg via ORAL
  Filled 2016-03-08: qty 28
  Filled 2016-03-08: qty 2
  Filled 2016-03-08: qty 28
  Filled 2016-03-08 (×3): qty 2
  Filled 2016-03-08: qty 28
  Filled 2016-03-08 (×2): qty 2
  Filled 2016-03-08: qty 28
  Filled 2016-03-08: qty 2

## 2016-03-08 MED ORDER — ALBUTEROL SULFATE HFA 108 (90 BASE) MCG/ACT IN AERS
2.0000 | INHALATION_SPRAY | Freq: Four times a day (QID) | RESPIRATORY_TRACT | Status: DC | PRN
Start: 2016-03-08 — End: 2016-03-10

## 2016-03-08 MED ORDER — FLUOXETINE HCL 10 MG PO CAPS
10.0000 mg | ORAL_CAPSULE | Freq: Every day | ORAL | Status: DC
  Administered 2016-03-08 – 2016-03-10 (×2): 10 mg via ORAL
  Filled 2016-03-08: qty 7
  Filled 2016-03-08 (×4): qty 1
  Filled 2016-03-08: qty 7

## 2016-03-08 MED ORDER — DIVALPROEX SODIUM ER 500 MG PO TB24
500.0000 mg | ORAL_TABLET | Freq: Two times a day (BID) | ORAL | Status: DC
  Administered 2016-03-08 – 2016-03-10 (×4): 500 mg via ORAL
  Filled 2016-03-08 (×4): qty 1
  Filled 2016-03-08 (×3): qty 14
  Filled 2016-03-08 (×2): qty 1
  Filled 2016-03-08: qty 14
  Filled 2016-03-08: qty 1

## 2016-03-08 MED ORDER — GABAPENTIN 300 MG PO CAPS
300.0000 mg | ORAL_CAPSULE | Freq: Three times a day (TID) | ORAL | Status: DC
  Administered 2016-03-08 – 2016-03-10 (×6): 300 mg via ORAL
  Filled 2016-03-08 (×2): qty 1
  Filled 2016-03-08: qty 21
  Filled 2016-03-08 (×5): qty 1
  Filled 2016-03-08 (×2): qty 21
  Filled 2016-03-08: qty 1
  Filled 2016-03-08 (×2): qty 21
  Filled 2016-03-08 (×2): qty 1

## 2016-03-08 MED ORDER — NICOTINE 7 MG/24HR TD PT24
7.0000 mg | MEDICATED_PATCH | Freq: Every day | TRANSDERMAL | Status: DC
  Filled 2016-03-08 (×5): qty 1

## 2016-03-08 MED ORDER — ABACAVIR-DOLUTEGRAVIR-LAMIVUD 600-50-300 MG PO TABS
1.0000 | ORAL_TABLET | Freq: Every day | ORAL | Status: DC
  Administered 2016-03-08 – 2016-03-10 (×3): 1 via ORAL
  Filled 2016-03-08 (×6): qty 1

## 2016-03-08 MED ORDER — ACETAMINOPHEN 325 MG PO TABS: 650.0000 mg | ORAL_TABLET | ORAL | Status: DC | PRN

## 2016-03-08 MED ORDER — LORAZEPAM 1 MG PO TABS
1.0000 mg | ORAL_TABLET | Freq: Three times a day (TID) | ORAL | Status: DC | PRN
  Administered 2016-03-09: 1 mg via ORAL
  Filled 2016-03-08: qty 1

## 2016-03-08 NOTE — [facility] Group Notes (Signed)
[facility] Group Notes: (Clinical Social Work)   03/08/2016      Type of Therapy:  Group Therapy   Participation Level:  Did Not Attend despite MHT prompting   Ambrose MantleMareida Grossman-Orr, LCSW 03/08/2016, 2:00 PM

## 2016-03-08 NOTE — Tx Team (Signed)
Initial Interdisciplinary Treatment Plan   PATIENT STRESSORS: Health problems Marital or family conflict Occupational concerns Substance abuse   PATIENT STRENGTHS: Average or above average intelligence Capable of independent living Communication skills General fund of knowledge Motivation for treatment/growth   PROBLEM LIST: Problem List/Patient Goals Date to be addressed Date deferred Reason deferred Estimated date of resolution  Depression "I have depression all my life.       Suicide Ideation "I took bunch of my prescribed pills"                                                 DISCHARGE CRITERIA:  Ability to meet basic life and health needs Improved stabilization in mood, thinking, and/or behavior Motivation to continue treatment in a less acute level of care Reduction of life-threatening or endangering symptoms to within safe limits  PRELIMINARY DISCHARGE PLAN: Attend aftercare/continuing care group Attend 12-step recovery group Outpatient therapy Participate in family therapy  PATIENT/FAMIILY INVOLVEMENT: This treatment plan has been presented to and reviewed with the patient, [name] A Perlstein, and/or family member.  The patient and family have been given the opportunity to ask questions and make suggestions.  Glenice LaineIbekwe, Maddilynn Esperanza B 03/08/2016, 5:21 AM

## 2016-03-08 NOTE — Progress Notes (Signed)
Patient did not attend the evening speaker AA meeting. Pt remained sleeping in bed.

## 2016-03-08 NOTE — ED Notes (Signed)
Patient noted sleeping in room. No complaints, stable, in no acute distress. Q15 minute rounds and monitoring via Security Cameras to continue.  

## 2016-03-08 NOTE — [facility] Suicide Risk Assessment (Signed)
[facility]   Nursing information obtained from:    Demographic factors:    Current Mental Status:    Loss Factors:    Historical Factors:    Risk Reduction Factors:     Total Time spent with patient: 1 hour Principal Problem: <principal problem not specified> Diagnosis:   Patient Active Problem List   Diagnosis Date Noted  . Bipolar 1 disorder, mixed, severe (HCC) [F31.63] 03/08/2016  . Marijuana abuse [F12.10]   . Bipolar affective disorder, depressed, moderate degree (HCC) [F31.32] 12/21/2015  . Bipolar affect, depressed (HCC) [F31.30] 12/21/2015  . Suicidal ideation [R45.851] 09/14/2014  . Depression [F32.9]   . Suicidal ideations [R45.851]   . Bipolar I disorder, most recent episode depressed (HCC) [F31.30] 05/24/2014  . GAD (generalized anxiety disorder) [F41.1] 05/24/2014  . MDD (major depressive disorder) (HCC) [F32.9] 05/23/2014  . Suicide attempt (HCC) [T14.91] 05/23/2014  . MDD (major depressive disorder), recurrent episode, severe (HCC) [F33.2] 05/23/2014  . Transaminitis [R74.0] 03/20/2014  . ADHD (attention deficit hyperactivity disorder) [F90.9] 09/12/2012  . INSOMNIA, CHRONIC [G47.00] 05/20/2007  . CARPAL TUNNEL SYNDROME [G56.00] 05/20/2007  . 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 HIV DISEASE [B20] 05/05/2007  . 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 He has a BF and living with alone and ruminated about his court date on 6/15 regarding shop lifting. He abuses, alcohol, cannabis and nicotine in addition to cocaine. Reportedly he does not feel safe and seeking crisis stabilization.   Continued Clinical Symptoms:  Alcohol Use Disorder Identification Test Final Score (AUDIT): 6 The "Alcohol Use Disorders Identification Test", Guidelines for Use in Primary Care, Second Edition.  World Environmental consultantHealth  Organization [facility](WHO). Score between 0-7:  no or low risk or alcohol related problems. Score between 8-15:  moderate risk of alcohol related problems. Score between 16-19:  high risk of alcohol related problems. Score 20 or above:  warrants further diagnostic evaluation for alcohol dependence and treatment.   CLINICAL FACTORS:   Severe Anxiety and/or Agitation Bipolar Disorder:   Mixed State Depression:   Aggression Anhedonia Comorbid alcohol abuse/dependence Impulsivity Insomnia Recent sense of peace/wellbeing Severe Alcohol/Substance Abuse/Dependencies More than one psychiatric diagnosis Unstable or Poor Therapeutic Relationship Previous Psychiatric Diagnoses and Treatments Medical Diagnoses and Treatments/Surgeries   Musculoskeletal: Strength & Muscle Tone: within normal limits Gait & Station: normal Patient leans: N/A  Psychiatric Specialty Exam: Physical Exam  ROS  Blood pressure 125/82, pulse 84, temperature 98 F (36.7 C), temperature source Oral, resp. rate 16, height 5\' 7"  (1.702 m), weight 73.483 kg (162 lb), SpO2 99 %.Body mass index is 25.37 kg/(m^2).  General Appearance: Disheveled and Guarded  Eye Contact:  Fair  Speech:  Slow  Volume:  Decreased  Mood:  Anxious and Depressed  Affect:  Constricted and Depressed  Thought Process:  Coherent and Goal Directed  Orientation:  Full (Time, Place, and Person)  Thought Content:  WDL  Suicidal Thoughts:  Yes.  without intent/plan  Homicidal Thoughts:  No  Memory:  Immediate;   Good Recent;   Fair  Judgement:  Impaired  Insight:  Lacking  Psychomotor Activity:  Decreased  Concentration:  Concentration: Fair and Attention Span: Fair  Recall:  Good  Fund of Knowledge:  Fair  Language:  Good  Akathisia:  Negative  Handed:  Right  AIMS (if indicated):     Assets:  Communication Skills Desire for Improvement Financial Resources/Insurance Housing Intimacy Leisure Time Resilience Social  Support Talents/Skills Transportation  ADL's:  Intact  Cognition:  WNL  Sleep:         COGNITIVE FEATURES THAT CONTRIBUTE TO RISK:  Closed-mindedness, Loss of executive function and Polarized thinking    SUICIDE RISK:   Moderate:  Frequent suicidal ideation with limited intensity, and duration, some specificity in terms of plans, no associated intent, good self-control, limited dysphoria/symptomatology, some risk factors present, and identifiable protective factors, including available and accessible social support.  PLAN OF CARE: patient with bipolar disorder, and polysubstance abuse admitted with increased symptom of depression, agitation and suicide ideation. He has relapse on drug of abuse and seeking treatment and needs safety monitoring.   I certify that inpatient services furnished can reasonably be expected to improve the patient's condition.   Leata Mouse, MD 03/08/2016, 12:25 PM

## 2016-03-08 NOTE — Progress Notes (Signed)
D: [facility]. Patient stated "I overdosed on bunch of prescribed pillS I have. I have chronic depression all my life and sometimes I don't feel like living". Patient was calm and cooperative during the admission process. Patient denies pain, SI, AH/VH. Rated depression 6/10. A: Skin was assessed, no contraband found. Tattoos noted all over the body. Per Patient "I have total of 42 tattoos. No wound/bruises noted. POC and unit policies explained and understanding verbalized. Consents obtained. Accepted food and fluids offered. R: Patient had no additional questions or concerns.

## 2016-03-08 NOTE — ED Notes (Signed)
Patient noted sleeping  room. No complaints, stable, in no acute distress. Q15 minute rounds and monitoring via Security Cameras to continue. 

## 2016-03-08 NOTE — Progress Notes (Signed)
Adult Psychoeducational Group Note  Date:  03/08/2016 Time:  0900 am  Group Topic/Focus:  Healthy support systems  Participation Level:  Did Not Attend  Participation Quality:    Affect:    Cognitive:    Insight:   Engagement in Group:    Modes of Intervention:    Additional Comments:    Phallon Haydu L 03/08/2016, 1:51 PM
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
## 2016-03-08 NOTE — BHH Counselor (Signed)
Adult Comprehensive Assessment  Patient ID: Edwin Martinez A [name], male DOB: 1986/08/20, 30 y.o. MRN: [id number]  Information Source: Information source: Patient  Current Stressors:  Educational / Learning stressors: 9th grade education, denies stress Employment / Job issues: Unemployed; has applied for disability recently Family Relationships: All deceased except brother, denies stress Financial / Lack of resources (include bankruptcy): Strain Housing / Lack of housing: NA Physical health (include injuries & life threatening diseases): HIV; denies stress Social relationships: NA Substance abuse: NA Bereavement / Loss: Mother 12/15/08 (became distraught on anniversary recently and was hospitalized at that time)  Living/Environment/Situation:  Living Arrangements: Spouse/significant other Living conditions (as described by patient or guardian): Stable home of 7 years  How long has patient lived in current situation?: 7 years What is atmosphere in current home: Comfortable, Supportive, Loving  Family History:  Marital status: Long term relationship Long term relationship, how long?: 7 years What types of issues is patient dealing with in the relationship?: NA Additional relationship information: Stable relationship of over 7 years Does patient have children?: No  Childhood History:  By whom was/is the patient raised?: Mother/father and step-parent Additional childhood history information: Patient reports having a good childhood Description of patient's relationship with caregiver when they were a child: Good relationship with parents as a child Patient's description of current relationship with people who raised him/her: Both deceased Does patient have siblings?: Yes Number of Siblings: 1 Description of patient's current relationship with siblings: Good; close relationship that is supportive Did patient suffer any verbal/emotional/physical/sexual abuse as a child?: No Did  patient suffer from severe childhood neglect?: No Has patient ever been sexually abused/assaulted/raped as an adolescent or adult?: No Was the patient ever a victim of a crime or a disaster?: No Witnessed domestic violence?: No Has patient been effected by domestic violence as an adult?: No  Education:  Highest grade of school patient has completed: 9th Currently a student?: No Learning disability?: No  Employment/Work Situation:  Employment situation: Unemployed Patient's job has been impacted by current illness: No What is the longest time patient has a held a job?: Six months Where was the patient employed at that time?: [employer] Has patient ever served in Buyer, retailcombat?: No  Financial Resources:  Surveyor, quantityinancial resources: Sales executiveood stamps, Income from spouse; Section 8 and gets a check from [employer] (And medication through Infectious Disease center in addition to Section 8 housing allowance and allowance for utilities) Does patient have a representative payee or guardian?: No  Alcohol/Substance Abuse:  What has been your use of drugs/alcohol within the last 12 months?: Alcohol consists of one 24 or 40 ounce beer approximately every 3-4 days; THC 2-3 times monthly and Cocaine 2-4 times per year.  States he does not really have a substance problem. Alcohol/Substance Abuse Treatment Hx: Denies past history Has alcohol/substance abuse ever caused legal problems?: No (Patient does have court case on 03/12/16 for [employer] larceny)  Social Support System:  Patient's Community Support System: Good Describe Community Support System: SO, sibling, neighbors and friends Type of faith/religion: [employer] How does patient's faith help to cope with current illness?: Prayer and hope  Leisure/Recreation:  Leisure and Hobbies: Gardening, cooking, poetry and singing  Strengths/Needs:  What things does the patient do well?: health maintenance, relationship, good attitude, helping  others In what areas does patient struggle / problems for patient: States that he really isn't struggling with anything right now.  He got overwhelmed and that is why he overdosed.  He wasn't stressed, just  needed to talk to somebody, and that is why he came to the hospital.  Discharge Plan:  Plan for no access to transportation at discharge: [employer] Will patient be returning to same living situation after discharge?: Yes Currently receiving community mental health services:   Goes to [place] now.  Needs medication management and therapy. If no, would patient like referral for services when discharged?: ([place]) [name] - not yet established Does patient have financial barriers related to discharge medications?: No  Summary/Recommendations:  Summary and Recommendations (to be completed by the evaluator): Patient is a 29yo male readmitted to the hospital with an overdose attempt and reports primary trigger for admission was anxiety and being suddenly overwhelmed.  Patient will benefit from crisis stabilization, medication evaluation, group therapy and psychoeducation, in addition to case management for discharge planning. At discharge it is recommended that Patient adhere to the established discharge plan and continue in treatment.  Ambrose Mantle, LCSW 03/08/2016, 6:16 PM

## 2016-03-08 NOTE — Progress Notes (Signed)
D:Pt presents with flat affect and depressed mood. Pt appears withdrawn and isolates in his room. Pt minimal and forwards little information during shift assessment. Pt rates depression 5/10. Pt would not specify any triggers. Pt denies suicidal thoughts and verbally contracts for safety. Pt appears to have poor hygiene and appears disheveled.  A: Medications reviewed with pt. Medications administered as ordered per MD. Verbal support provided. Pt encouraged to attend groups. 15 minute checks performed for safety. R: Pt receptive to tx.

## 2016-03-09 NOTE — [facility] Group Notes (Signed)
[facility]   03/09/2016 11:33 AM  Participation Quality:  Invited. DID NOT ATTEND. Pt chose to rest in room.   Smart, Johnn Krasowski LCSW

## 2016-03-09 NOTE — Progress Notes (Signed)
Recreation Therapy Notes  Date: 06.12.2047 Time: 9:30am Location: 300 [address] Group Room   Group Topic: Stress Management  Goal Area(s) Addresses:  Patient will actively participate in stress management techniques presented during session.   Behavioral Response: Did not attend.   Marykay Lexenise L Duell Holdren, LRT/CTRS        Belicia Difatta L 03/09/2016 10:17 AM

## 2016-03-09 NOTE — Progress Notes (Signed)
D: Pt presents irritable, easily agitated and labile this morning. Pt upset after staff asked pt to keep the thermostat at 65 degrees in order for the pt and roommate to be comfortable. Pt walked off yelling and cussing at staff. Pt then refused to take his meds today and requested to be discharged. A: Medications offered to pt. Verbal support provided. Pt encouraged to attend groups. 15 minute checks performed for safety.  R: Pt signed 72 hour request for discharge on 03/08/16. Pt continues to asked to be discharged.
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
## 2016-03-09 NOTE — Progress Notes (Signed)
[facility] MD Progress Note  03/09/2016 3:33 PM Edwin Martinez  MRN:  [id number] Subjective:   "I'm ok.  Just waiting to see when I will go home" Objective:  Edwin Martinez came in due to an overdose of personal medications.   Patient admitted to ingesting 70-90 pills a combination of Neurontin, Depakote, fluoxetine, buspar, and trimax. Patient denies current suicidal thoughts, HI, A/VH, and other self-injurious behaviors. Patient states that it was an impulsive thing to do but he did not mean to kill himself. Tolerating meds well.   He states he will continue his outpatient at [facility].  Principal Problem: Bipolar 1 disorder, mixed, severe (HCC) Diagnosis:   Patient Active Problem List   Diagnosis Date Noted  . Bipolar 1 disorder, mixed, severe (HCC) [F31.63] 03/08/2016  . 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 Polysubstance abuse [F19.10]   . Marijuana abuse [F12.10]   . Bipolar affective disorder, depressed, moderate degree (HCC) [F31.32] 12/21/2015  . Bipolar affect, depressed (HCC) [F31.30] 12/21/2015  . Suicidal ideation [R45.851] 09/14/2014  . Depression [F32.9]   . Suicidal ideations [R45.851]   . Bipolar I disorder, most recent episode depressed (HCC) [F31.30] 05/24/2014  . GAD (generalized anxiety disorder) [F41.1] 05/24/2014  . MDD (major depressive disorder) (HCC) [F32.9] 05/23/2014  . Suicide attempt (HCC) [T14.91] 05/23/2014  . MDD (major depressive disorder), recurrent episode, severe (HCC) [F33.2] 05/23/2014  . Transaminitis [R74.0] 03/20/2014  . ADHD (attention deficit hyperactivity disorder) [F90.9] 09/12/2012  . INSOMNIA, CHRONIC [G47.00] 05/20/2007  . CARPAL TUNNEL SYNDROME [G56.00] 05/20/2007  . 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 HIV DISEASE [B20] 05/05/2007  . 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 Bipolar 1 disorder (HCC)   . Schizophrenia (HCC)   . Hypertension   . HIV (human immunodeficiency virus infection) (HCC)   . Anxiety   . Seizures (HCC)   . Epileptic seizures (HCC)   . Bipolar disorder (HCC)   . Asthma   . ADHD (attention deficit hyperactivity disorder)     Past Surgical History  Procedure Laterality Date  . Dental surgery     Family History:  Family History  Problem Relation Age of Onset  . Huntington's disease Father   . Heart disease Mother    Family Psychiatric  History: see HPI Social History:  History  Alcohol Use  . 48.0 oz/week  . 80 Standard drinks or equivalent per week    Comment: once week      History  Drug Use  . 2.00 per week  . Special: "Crack" cocaine    Comment: every other day     Social History   Social History  . Marital Status: Unknown    Spouse Name: N/A  . Number of Children: N/A  . Years of Education: N/A   Social History Main Topics  . Smoking status: Current Every Day Smoker -- 0.50 packs/day    Types: Cigarettes    Start date: 09/29/1991  . Smokeless tobacco: None  . Alcohol Use: 48.0 oz/week    80 Standard drinks or equivalent per week     Comment: once week   . Drug Use: 2.00 per week    Special: "Crack" cocaine     Comment: every other day   . Sexual Activity: Yes  Birth Control/ Protection: Condom   Other Topics Concern  . None   Social History Narrative   ** Merged History Encounter **       Additional Social History:                         Sleep: Good  Appetite:  Good  Current Medications: Current Facility-Administered Medications  Medication Dose Route Frequency Provider Last Rate Last Dose  . abacavir-dolutegravir-lamiVUDine (TRIUMEQ) 600-50-300 MG per tablet 1 tablet  1 tablet Oral Daily Kristeen MansFran E Hobson, NP   1 tablet at 03/08/16 0834  . acetaminophen (TYLENOL) tablet 650 mg  650 mg Oral Q4H PRN Kristeen MansFran E  Hobson, NP      . albuterol (PROVENTIL HFA;VENTOLIN HFA) 108 (90 Base) MCG/ACT inhaler 2 puff  2 puff Inhalation Q6H PRN Kristeen MansFran E Hobson, NP      . busPIRone (BUSPAR) tablet 20 mg  20 mg Oral BID Kristeen MansFran E Hobson, NP   20 mg at 03/08/16 1632  . divalproex (DEPAKOTE ER) 24 hr tablet 500 mg  500 mg Oral BID Kristeen MansFran E Hobson, NP   500 mg at 03/08/16 1632  . FLUoxetine (PROZAC) capsule 10 mg  10 mg Oral Daily Kristeen MansFran E Hobson, NP   10 mg at 03/08/16 0835  . gabapentin (NEURONTIN) capsule 300 mg  300 mg Oral TID Kristeen MansFran E Hobson, NP   300 mg at 03/08/16 1632  . LORazepam (ATIVAN) tablet 1 mg  1 mg Oral Q8H PRN Kristeen MansFran E Hobson, NP      . nicotine (NICODERM CQ - dosed in mg/24 hr) patch 7 mg  7 mg Transdermal Daily Kristeen MansFran E Hobson, NP   7 mg at 03/08/16 0835  . traZODone (DESYREL) tablet 150 mg  150 mg Oral QHS PRN Kristeen MansFran E Hobson, NP        Lab Results: No results found for this or any previous visit (from the past 48 hour(s)).  Blood Alcohol level:  Lab Results  Component Value Date   ETH 51* 03/07/2016   ETH 72* 02/13/2016    Physical Findings: AIMS:  , ,  ,  ,    CIWA:    COWS:     Musculoskeletal: Strength & Muscle Tone: within normal limits Gait & Station: normal Patient leans: N/A  Psychiatric Specialty Exam: Physical Exam  Vitals reviewed. Psychiatric: His mood appears anxious. Thought content is not paranoid. He exhibits a depressed mood. He expresses no homicidal ideation.    Review of Systems  Psychiatric/Behavioral: Positive for depression. Negative for suicidal ideas and hallucinations. The patient is nervous/anxious.   All other systems reviewed and are negative.   Blood pressure 122/76, pulse 64, temperature 98 F (36.7 C), temperature source Oral, resp. rate 16, height 5\' 7"  (1.702 m), weight 73.483 kg (162 lb), SpO2 99 %.Body mass index is 25.37 kg/(m^2).  General Appearance: Casual  Eye Contact:  Good  Speech:  Clear and Coherent  Volume:  Normal  Mood:  Anxious  Affect:   Congruent  Thought Process:  Linear  Orientation:  Full (Time, Place, and Person)  Thought Content:  NA  Suicidal Thoughts:  No  Homicidal Thoughts:  No  Memory:  Immediate;   Fair Recent;   Fair Remote;   Fair  Judgement:  Fair  Insight:  Lacking  Psychomotor Activity:  Restlessness  Concentration:  Concentration: Fair and Attention Span: Fair  Recall:  FiservFair  Fund of Knowledge:  Fair  Language:  Good  Akathisia:  Negative  Handed:  Right  AIMS (if indicated):     Assets:  Resilience  ADL's:  Intact  Cognition:  WNL  Sleep:  Number of Hours: 6.5   Treatment Plan Summary: Daily contact with patient to assess and evaluate symptoms and progress in treatment and Medication management Continue with Buspar 20 mg PO BID Depakote 500 mg PO BID, Prozac 10 mg mood stabilization. Continue with Trazodone 100 mg for insomnia Will continue to monitor vitals ,medication compliance and treatment side effects while patient is here.  Reviewed labsWNL, BAL - 51, UDS - pos for cocaine CSW will start working on disposition.  Patient to participate in therapeutic milieu Discussed with Dr Jama Flavors plan of care.  Lindwood Qua, NP [facility] 03/09/2016, 3:33 PM

## 2016-03-09 NOTE — Progress Notes (Signed)
D. Pt had been in room and in bed for much of the evening, little to no participation in milieu. Pt has denied any SI this evening and did not request or receive any medications and did not verbalize any complaints of pain. A. Support and encouragement provided. R. Safety maintained, will continue to monitor.

## 2016-03-09 NOTE — [facility] Suicide Risk Assessment (Signed)
[facility] INPATIENT:  Family/Significant Other Suicide Prevention Education  Suicide Prevention Education:  Contact Attempts: Thereasa ParkinReginald Herbin (pt's boyfriend) [phone] has been identified by the patient as the family member/significant other with whom the patient will be residing, and identified as the person(s) who will aid the patient in the event of a mental health crisis.  With written consent from the patient, two attempts were made to provide suicide prevention education, prior to and/or following the patient's discharge.  We were unsuccessful in providing suicide prevention education.  A suicide education pamphlet was given to the patient to share with family/significant other.  Date and time of first attempt: 03/09/16 at 9:45AM (unable to leave voicemail) Date and time of second attempt: 03/09/16 at 3:04PM (Unable to leave voicemail).   Smart, Chenae Brager LCSW 03/09/2016, 3:04 PM

## 2016-03-10 MED ORDER — NICOTINE 7 MG/24HR TD PT24: 7.0000 mg | MEDICATED_PATCH | Freq: Every day | TRANSDERMAL | Status: DC

## 2016-03-10 MED ORDER — BUSPIRONE HCL 10 MG PO TABS: 20.0000 mg | ORAL_TABLET | Freq: Two times a day (BID) | ORAL | Status: DC

## 2016-03-10 MED ORDER — ABACAVIR-DOLUTEGRAVIR-LAMIVUD 600-50-300 MG PO TABS: 1.0000 | ORAL_TABLET | Freq: Every day | ORAL | Status: DC

## 2016-03-10 MED ORDER — GABAPENTIN 300 MG PO CAPS: 300.0000 mg | ORAL_CAPSULE | Freq: Three times a day (TID) | ORAL | Status: DC

## 2016-03-10 MED ORDER — FLUOXETINE HCL 10 MG PO CAPS: 10.0000 mg | ORAL_CAPSULE | Freq: Every day | ORAL | Status: DC

## 2016-03-10 MED ORDER — DIVALPROEX SODIUM ER 500 MG PO TB24: 500.0000 mg | ORAL_TABLET | Freq: Two times a day (BID) | ORAL | Status: DC

## 2016-03-10 MED ORDER — TRAZODONE HCL 150 MG PO TABS: 150.0000 mg | ORAL_TABLET | Freq: Every evening | ORAL | Status: DC | PRN

## 2016-03-10 NOTE — Progress Notes (Signed)
D. Pt had been up and visible in milieu this evening, spoke about how he is going to be going home in the morning. Pt has denied any SI this evening and did ask for and receive medications for anxiety and for sleep without inicdent. A. Support and encouragement provided. R. Safety maintained, will continue to monitor.

## 2016-03-10 NOTE — Discharge Summary (Signed)
Physician Discharge Summary Note  Patient:  Edwin Martinez is an 30 y.o., male MRN:  [id number] DOB:  05-16-1986 Patient phone:  [phone] (home)  Patient address:   [address] [place] [address],  Total Time spent with patient: 30 minutes  Date of Admission:  03/08/2016 Date of Discharge: 03/10/2016  Reason for Admission:  Overdose of prescription meds  Principal Problem: Bipolar 1 disorder, mixed, severe [facility]) Discharge Diagnoses: Patient Active Problem List   Diagnosis Date Noted  . Bipolar 1 disorder, mixed, severe (HCC) [F31.63] 03/08/2016  . 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 Polysubstance abuse [F19.10]   . Marijuana abuse [F12.10]   . Bipolar affective disorder, depressed, moderate degree (HCC) [F31.32] 12/21/2015  . Bipolar affect, depressed (HCC) [F31.30] 12/21/2015  . Suicidal ideation [R45.851] 09/14/2014  . Depression [F32.9]   . Suicidal ideations [R45.851]   . Bipolar I disorder, most recent episode depressed (HCC) [F31.30] 05/24/2014  . GAD (generalized anxiety disorder) [F41.1] 05/24/2014  . MDD (major depressive disorder) (HCC) [F32.9] 05/23/2014  . Suicide attempt (HCC) [T14.91] 05/23/2014  . MDD (major depressive disorder), recurrent episode, severe (HCC) [F33.2] 05/23/2014  . Transaminitis [R74.0] 03/20/2014  . ADHD (attention deficit hyperactivity disorder) [F90.9] 09/12/2012  . INSOMNIA, CHRONIC [G47.00] 05/20/2007  . CARPAL TUNNEL SYNDROME [G56.00] 05/20/2007  . 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 HIV DISEASE [B20] 05/05/2007  . 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 Bipolar 1 disorder (HCC)   . Schizophrenia (HCC)   . Hypertension   . HIV (human immunodeficiency virus infection) (HCC)   . Anxiety   . Seizures (HCC)   . Epileptic seizures (HCC)    . Bipolar disorder (HCC)   . Asthma   . ADHD (attention deficit hyperactivity disorder)     Past Surgical History  Procedure Laterality Date  . Dental surgery     Family History:  Family History  Problem Relation Age of Onset  . Huntington's disease Father   . Heart disease Mother    Family Psychiatric  History: see HPI Social History:  History  Alcohol Use  . 48.0 oz/week  . 80 Standard drinks or equivalent per week    Comment: once week      History  Drug Use  . 2.00 per week  . Special: "Crack" cocaine    Comment: every other day     Social History   Social History  . Marital Status: Unknown    Spouse Name: N/A  . Number of Children: N/A  . Years of Education: N/A   Social History Main Topics  . Smoking status: Current Every Day Smoker -- 0.50 packs/day    Types: Cigarettes    Start date: 09/29/1991  . Smokeless tobacco: None  . Alcohol Use: 48.0 oz/week    80 Standard drinks or equivalent per week     Comment: once week   . Drug Use: 2.00 per week    Special: "Crack" cocaine     Comment: every other day   . Sexual Activity: Yes    Birth Control/ Protection: Condom   Other Topics Concern  . None   Social History Narrative   ** Merged History Encounter **       Hospital Course:  Edwin Martinez is an 30 y.o. male. Patient was brought into the  ED by EMS after he called because he overdosed on personal medications. Patient admits to ingesting 70-90 pills a combination of Neurontin, Depakote, Fluoxetine, Buspar, and Trimax.    Edwin Martinez was admitted for Bipolar 1 disorder, mixed, severe (HCC) and crisis management.  She was treated with Wellbutrin 10 mg anxiety symptoms, Depakote 500 mg for mood stabilization, Fluoxetine 10 mg depression, and Gabapentin 300 mg for anxiety/agitation.  Medical problems were identified and treated as needed.  Home medications were restarted as appropriate.  Improvement was monitored by observation and Edwin ClossEdward A  Martinez daily report of symptom reduction.  Emotional and mental status was monitored by daily self inventory reports completed by Edwin Martinez and clinical staff.  Patient reported continued improvement, denied any new concerns.  Patient had been compliant on medications and denied side effects.  Support and encouragement was provided.    Patient did well during inpatient stay.  At time of discharge, patient rated both depression and anxiety levels to be manageable and minimal.  Patient encouraged to attend groups to help with recognizing triggers of emotional crises and de-stabilizations.  Patient encouraged to attend group to help identify the positive things in life that would help in dealing with feelings of loss, depression and unhealthy or abusive tendencies.         Edwin Martinez was evaluated by the treatment team for stability and plans for continued recovery upon discharge.  She was offered further treatment options upon discharge including Residential, Intensive Outpatient and Outpatient treatment.  She will follow up with agencies listed below for medication management and counseling.  Encouraged patient to maintain satisfactory support network and home environment.  Advised to adhere to medication compliance and outpatient treatment follow up.      Edwin Martinez motivation was an integral factor for scheduling further treatment.  Employment, transportation, bed availability, health status, family support, and any pending legal issues were also considered during his hospital stay.  Upon completion of this admission the patient was both mentally and medically stable for discharge denying suicidal/homicidal ideation, auditory/visual/tactile hallucinations, delusional thoughts and paranoia.       Physical Findings: AIMS:  , ,  ,  ,    CIWA:    COWS:     Musculoskeletal: Strength & Muscle Tone: within normal limits Gait & Station: normal Patient leans: N/A  Psychiatric Specialty  Exam:  See MD SRA Physical Exam  ROS  Blood pressure 116/71, pulse 74, temperature 98.8 F (37.1 C), temperature source Oral, resp. rate 18, height 5\' 7"  (1.702 m), weight 73.483 kg (162 lb), SpO2 99 %.Body mass index is 25.37 kg/(m^2).   Have you used any form of tobacco in the last 30 days? (Cigarettes, Smokeless Tobacco, Cigars, and/or Pipes): Yes  Has this patient used any form of tobacco in the last 30 days? (Cigarettes, Smokeless Tobacco, Cigars, and/or Pipes) Yes, Rx given  Blood Alcohol level:  Lab Results  Component Value Date   ETH 51* 03/07/2016   ETH 72* 02/13/2016    Metabolic Disorder Labs:  No results found for: HGBA1C, MPG No results found for: PROLACTIN Lab Results  Component Value Date   CHOL 136 02/05/2015   TRIG 147 02/05/2015   HDL 54 02/05/2015   CHOLHDL 2.5 02/05/2015   VLDL 29 02/05/2015   LDLCALC 53 02/05/2015   LDLCALC 45 02/21/2014    See Psychiatric Specialty Exam and Suicide Risk Assessment completed by Attending Physician prior to discharge.  Discharge destination:  Home  Is patient on multiple antipsychotic therapies at discharge:  No   Has Patient had three or more failed trials of antipsychotic monotherapy by history:  No  Recommended Plan for Multiple Antipsychotic Therapies: NA     Medication List    STOP taking these medications        acetaminophen 500 MG tablet  Commonly known as:  TYLENOL     albuterol 108 (90 Base) MCG/ACT inhaler  Commonly known as:  PROVENTIL HFA;VENTOLIN HFA     hydrOXYzine 25 MG tablet  Commonly known as:  ATARAX/VISTARIL     nicotine 21 mg/24hr patch  Commonly known as:  NICODERM CQ - dosed in mg/24 hours  Replaced by:  nicotine 7 mg/24hr patch      TAKE these medications      Indication   abacavir-dolutegravir-lamiVUDine 600-50-300 MG tablet  Commonly known as:  TRIUMEQ  Take 1 tablet by mouth daily.   Indication:  HIV Disease     busPIRone 10 MG tablet  Commonly known as:  BUSPAR  Take  2 tablets (20 mg total) by mouth 2 (two) times daily. For anxiety   Indication:  Anxiety Disorder, Generalized Anxiety Disorder     divalproex 500 MG 24 hr tablet  Commonly known as:  DEPAKOTE ER  Take 1 tablet (500 mg total) by mouth 2 (two) times daily. For mood stabilization   Indication:  Mood stabilization     FLUoxetine 10 MG capsule  Commonly known as:  PROZAC  Take 1 capsule (10 mg total) by mouth daily. For depression   Indication:  Major Depressive Disorder     gabapentin 300 MG capsule  Commonly known as:  NEURONTIN  Take 1 capsule (300 mg total) by mouth 3 (three) times daily. For agitatiom   Indication:  Agitation     nicotine 7 mg/24hr patch  Commonly known as:  NICODERM CQ - dosed in mg/24 hr  Place 1 patch (7 mg total) onto the skin daily.   Indication:  Nicotine Addiction     traZODone 150 MG tablet  Commonly known as:  DESYREL  Take 1 tablet (150 mg total) by mouth at bedtime as needed for sleep.   Indication:  Trouble Sleeping           Follow-up Information    [facility] to [facility].   Specialty:  Behavioral Health   Why:  Walk in between 8am-9am Monday through Friday for hospital follow-up/medication management/assessment for counseling services.    [address] [place] [address] [phone]       Follow-up recommendations:  Activity:  as tol Diet:  as tol  Comments:  1.  Take all your medications as prescribed.   2.  Report any adverse side effects to outpatient provider. 3.  Patient instructed to not use alcohol or illegal drugs while on prescription medicines. 4.  In the event of worsening symptoms, instructed patient to call 911, the crisis hotline or go to nearest emergency room for evaluation of symptoms.  Signed: Lindwood Qua, NP [facility] 03/10/2016, 10:05 AM  Patient seen, Suicide Assessment Completed.  Disposition Plan Reviewed
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
## 2016-03-10 NOTE — Progress Notes (Signed)
Patient discharged to lobby. Patient was stable and appreciative at that time. All papers, samples and prescriptions were given and valuables returned. Verbal understanding expressed. Denies SI/HI and A/VH. Patient given opportunity to express concerns and ask questions.  
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
## 2016-03-10 NOTE — Progress Notes (Signed)
  [facility]  Will you be returning to the same living situation after discharge:  Yes,  home At discharge, do you have transportation home?: Yes,  bus pass in chart Do you have the ability to pay for your medications: Yes,  mental health  Release of information consent forms completed and submitted to medical records by CSW.  Patient to Follow up at: Follow-up Information    [facility]   Specialty:  Behavioral Health   Why:  Walk in between 8am-9am Monday through Friday for hospital follow-up/medication management/assessment for counseling services.    Contact information:   [address] [place] [id number] [phone]       Next level of care provider [facility] Link:no  Safety Planning and Suicide Prevention discussed: Yes,  SPE completed with pt; contact attempts made with pt's boyfriend. SPI pamphlet and Mobile crisis information provided to pt and he was encouraged to share information with support network, ask questions, and talk about any concerns relating to SPE.  Have you used any form of tobacco in the last 30 days? (Cigarettes, Smokeless Tobacco, Cigars, and/or Pipes): Yes  Has patient been referred to the Quitline?: Patient refused referral  Patient has been referred for addiction treatment: Yes  Smart, Aneeka Bowden LCSW 03/10/2016, 9:12 AM

## 2016-05-03 ENCOUNTER — Emergency Department (HOSPITAL_COMMUNITY)
Admission: EM | Admit: 2016-05-03 | Discharge: 2016-05-04 | Disposition: A | Discharge status: Other Acute Inpatient Hospital | Payer: Self-pay | Attending: Emergency Medicine | Admitting: Emergency Medicine

## 2016-05-03 DIAGNOSIS — J45909 Unspecified asthma, uncomplicated: Secondary | ICD-10-CM | POA: Insufficient documentation

## 2016-05-03 DIAGNOSIS — I10 Essential (primary) hypertension: Secondary | ICD-10-CM | POA: Insufficient documentation

## 2016-05-03 DIAGNOSIS — F17210 Nicotine dependence, cigarettes, uncomplicated: Secondary | ICD-10-CM | POA: Insufficient documentation

## 2016-05-03 DIAGNOSIS — F1410 Cocaine abuse, uncomplicated: Secondary | ICD-10-CM

## 2016-05-03 DIAGNOSIS — Z79899 Other long term (current) drug therapy: Secondary | ICD-10-CM | POA: Insufficient documentation

## 2016-05-03 DIAGNOSIS — F3130 Bipolar disorder, current episode depressed, mild or moderate severity, unspecified: Secondary | ICD-10-CM

## 2016-05-03 DIAGNOSIS — R45851 Suicidal ideations: Secondary | ICD-10-CM | POA: Insufficient documentation

## 2016-05-03 LAB — CBC
HCT: 45.2 % (ref 39.0–52.0)
Hemoglobin: 15.2 g/dL (ref 13.0–17.0)
MCH: 30 pg (ref 26.0–34.0)
MCHC: 33.6 g/dL (ref 30.0–36.0)
MCV: 89.2 fL (ref 78.0–100.0)
Platelets: 273 10*3/uL (ref 150–400)
RBC: 5.07 MIL/uL (ref 4.22–5.81)
RDW: 13.8 % (ref 11.5–15.5)
WBC: 9 10*3/uL (ref 4.0–10.5)

## 2016-05-03 LAB — COMPREHENSIVE METABOLIC PANEL
ALT: 15 U/L — ABNORMAL LOW (ref 17–63)
AST: 23 U/L (ref 15–41)
Albumin: 4.5 g/dL (ref 3.5–5.0)
Alkaline Phosphatase: 56 U/L (ref 38–126)
Anion gap: 8 (ref 5–15)
BILIRUBIN TOTAL: 0.5 mg/dL (ref 0.3–1.2)
BUN: 14 mg/dL (ref 6–20)
CO2: 22 mmol/L (ref 22–32)
CREATININE: 0.98 mg/dL (ref 0.61–1.24)
Calcium: 8.9 mg/dL (ref 8.9–10.3)
Chloride: 107 mmol/L (ref 101–111)
Glucose, Bld: 86 mg/dL (ref 65–99)
POTASSIUM: 3.6 mmol/L (ref 3.5–5.1)
Sodium: 137 mmol/L (ref 135–145)
TOTAL PROTEIN: 7.7 g/dL (ref 6.5–8.1)

## 2016-05-03 LAB — ETHANOL: ALCOHOL ETHYL (B): 87 mg/dL — AB (ref <5)

## 2016-05-03 LAB — RAPID URINE DRUG SCREEN, HOSP PERFORMED
Amphetamines: NOT DETECTED
Barbiturates: NOT DETECTED
Benzodiazepines: NOT DETECTED
COCAINE: POSITIVE — AB
OPIATES: NOT DETECTED
Tetrahydrocannabinol: POSITIVE — AB

## 2016-05-03 LAB — ACETAMINOPHEN LEVEL: Acetaminophen (Tylenol), Serum: <10 ug/mL — ABNORMAL LOW (ref 10–30)

## 2016-05-03 LAB — SALICYLATE LEVEL: Salicylate Lvl: <4 mg/dL (ref 2.8–30.0)

## 2016-05-03 MED ORDER — ALBUTEROL SULFATE HFA 108 (90 BASE) MCG/ACT IN AERS: 2.0000 | INHALATION_SPRAY | Freq: Four times a day (QID) | RESPIRATORY_TRACT | Status: DC | PRN

## 2016-05-03 MED ORDER — ABACAVIR-DOLUTEGRAVIR-LAMIVUD 600-50-300 MG PO TABS
1.0000 | ORAL_TABLET | Freq: Every day | ORAL | Status: DC
  Administered 2016-05-03: 1 via ORAL
  Filled 2016-05-03: qty 1

## 2016-05-03 MED ORDER — DIVALPROEX SODIUM ER 500 MG PO TB24
500.0000 mg | ORAL_TABLET | Freq: Two times a day (BID) | ORAL | Status: DC
  Administered 2016-05-03 – 2016-05-04 (×2): 500 mg via ORAL
  Filled 2016-05-03 (×2): qty 1

## 2016-05-03 MED ORDER — NICOTINE 21 MG/24HR TD PT24
21.0000 mg | MEDICATED_PATCH | Freq: Every day | TRANSDERMAL | Status: DC
  Administered 2016-05-03: 21 mg via TRANSDERMAL
  Filled 2016-05-03 (×2): qty 1

## 2016-05-03 MED ORDER — ACETAMINOPHEN 325 MG PO TABS
650.0000 mg | ORAL_TABLET | ORAL | Status: DC | PRN
  Administered 2016-05-03: 650 mg via ORAL
  Filled 2016-05-03: qty 2

## 2016-05-03 NOTE — BH Assessment (Signed)
Assessment completed. Consulted Alberteen SamFran Hobson, NP who recommended inpatient criteria. TTS will seek placement. Informed Elpidio AnisShari Upstill, PA-C of the recommended.

## 2016-05-03 NOTE — ED Triage Notes (Signed)
Pt states that he has thoughts of suicide via overdosing. Hx of same in past years. States that he thinks that his meds are not working even though he has been taking them. Alert and oriented.

## 2016-05-03 NOTE — ED Provider Notes (Signed)
[facility]-EMERGENCY DEPT Provider Note   CSN: [id number] Arrival date & time: 05/03/16  4098  First Provider Contact:  None       History   Chief Complaint Chief Complaint  Patient presents with  . Suicidal    HPI HPI Comments: Edwin Martinez is a 30 y.o. Male, with PMHx of BPD, Schizophrenia, SI, substance abuse who presents to the Emergency Department for thoughts of suicide with a plan of overdosing; onset 5 hours ago. Pt has a Hx of one suicide attempt mid-June 2017; attempted to overdose on his medication. Pt was admitted to [facility] with Behavioral Health and discharged  Pt currently denies any substance abuse issues. Patient states he is a current smoker. Denies HI, hallucinations. Patient denies nausea, diarrhea, pain, illness. Denies any current substance abuse issues. He is complaining of a rash to bilateral axilla, itches, not painful, x 1-2 weeks.    The history is provided by the patient. No language interpreter was used.    Past Medical History:  Diagnosis Date  . ADHD (attention deficit hyperactivity disorder)   . Anxiety   . Asthma   . Bipolar 1 disorder (HCC)   . Bipolar disorder (HCC)   . Epileptic seizures (HCC)   . HIV (human immunodeficiency virus infection) (HCC)   . Hypertension   . Schizophrenia (HCC)   . Seizures [facility])     Patient Active Problem List   Diagnosis Date Noted  . Bipolar 1 disorder, mixed, severe (HCC) 03/08/2016  . Cocaine abuse with cocaine-induced mood disorder (HCC)   . Polysubstance abuse   . Marijuana abuse   . Bipolar affective disorder, depressed, moderate degree (HCC) 12/21/2015  . Bipolar affect, depressed (HCC) 12/21/2015  . Suicidal ideation 09/14/2014  . Depression   . Suicidal ideations   . Bipolar I disorder, most recent episode depressed (HCC) 05/24/2014  . GAD (generalized anxiety disorder) 05/24/2014  . MDD (major depressive disorder) (HCC) 05/23/2014  . Suicide attempt (HCC) 05/23/2014  . MDD (major depressive  disorder), recurrent episode, severe (HCC) 05/23/2014  . Transaminitis 03/20/2014  . ADHD (attention deficit hyperactivity disorder) 09/12/2012  . INSOMNIA, CHRONIC 05/20/2007  . CARPAL TUNNEL SYNDROME 05/20/2007  . 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 HIV DISEASE 05/05/2007  . BPLR I, MIXED, MOST RECENT EPSD, MODERATE 05/05/2007    Past Surgical History:  Procedure Laterality Date  . DENTAL SURGERY         Home Medications    Prior to Admission medications   Medication Sig Start Date End Date Taking? Authorizing Provider  abacavir-dolutegravir-lamiVUDine (TRIUMEQ) 600-50-300 MG tablet Take 1 tablet by mouth at bedtime.   Yes Historical Provider, MD  albuterol (PROVENTIL HFA;VENTOLIN HFA) 108 (90 Base) MCG/ACT inhaler Inhale 2 puffs into the lungs every 6 (six) hours as needed for wheezing or shortness of breath.   Yes Historical Provider, MD  busPIRone (BUSPAR) 10 MG tablet Take 20 mg by mouth 2 (two) times daily.   Yes Historical Provider, MD  divalproex (DEPAKOTE ER) 500 MG 24 hr tablet Take 500 mg by mouth 2 (two) times daily.   Yes Historical Provider, MD  FLUoxetine (PROZAC) 10 MG capsule Take 10 mg by mouth at bedtime.   Yes Historical Provider, MD  gabapentin (NEURONTIN) 300 MG capsule Take 300 mg by mouth 3 (three) times daily.   Yes Historical Provider, MD  traZODone (DESYREL) 150 MG tablet Take 1 tablet (150 mg total) by mouth at bedtime as needed for sleep. 03/10/16  Yes Velna Hatchet  Dominga FerryAgustin, NP  nicotine (NICODERM CQ - DOSED IN MG/24 HR) 7 mg/24hr patch Place 1 patch (7 mg total) onto the skin daily. Patient not taking: Reported on 05/03/2016 03/10/16   Adonis BrookSheila Agustin, NP    Family History Family History  Problem Relation Age of Onset  . Huntington's disease Father   . Heart disease Mother     Social History Social History  Substance Use Topics  . Smoking status: Current Every Day Smoker    Packs/day: 0.50    Types: Cigarettes    Start date: 09/29/1991    . Smokeless tobacco: Not on file  . Alcohol use 48.0 oz/week    80 Standard drinks or equivalent per week     Comment: once week      Allergies   Magnesium-containing compounds; Peanut-containing drug products; Atripla [efavirenz-emtricitab-tenofovir]; Esomeprazole magnesium; Bactrim [sulfamethoxazole-trimethoprim]; and Penicillins   Review of Systems Review of Systems  Constitutional: Negative for chills and fever.  HENT: Negative.   Respiratory: Negative.   Cardiovascular: Negative.   Gastrointestinal: Negative.   Musculoskeletal: Negative.   Skin: Positive for rash.  Neurological: Negative.   Psychiatric/Behavioral: Positive for dysphoric mood and suicidal ideas. Negative for hallucinations and self-injury.     Physical Exam Updated Vital Signs BP 118/77 (BP Location: Right Arm)   Pulse (!) 55   Temp 98.6 F (37 C) (Oral)   Resp 18   SpO2 97%   Physical Exam  Constitutional: He is oriented to person, place, and time. He appears well-developed and well-nourished. No distress.  HENT:  Head: Normocephalic and atraumatic.  Neck: Normal range of motion. Neck supple.  Cardiovascular: Normal rate and regular rhythm.   Pulmonary/Chest: Effort normal and breath sounds normal.  Abdominal: Soft. Bowel sounds are normal. There is no tenderness. There is no rebound and no guarding.  Musculoskeletal: Normal range of motion.  Neurological: He is alert and oriented to person, place, and time. No cranial nerve deficit.  Skin: Skin is warm and dry. No rash noted. He is not diaphoretic.  Psychiatric: His speech is normal. He is withdrawn. He exhibits a depressed mood. He expresses suicidal ideation. He expresses no homicidal ideation. He expresses suicidal plans.  Nursing note and vitals reviewed.   ED Treatments / Results  Labs (all labs ordered are listed, but only abnormal results are displayed) Labs Reviewed  COMPREHENSIVE METABOLIC PANEL - Abnormal; Notable for the  following:       Result Value   ALT 15 (*)    All other components within normal limits  ETHANOL - Abnormal; Notable for the following:    Alcohol, Ethyl (B) 87 (*)    All other components within normal limits  ACETAMINOPHEN LEVEL - Abnormal; Notable for the following:    Acetaminophen (Tylenol), Serum <10 (*)    All other components within normal limits  URINE RAPID DRUG SCREEN, HOSP PERFORMED - Abnormal; Notable for the following:    Cocaine POSITIVE (*)    Tetrahydrocannabinol POSITIVE (*)    All other components within normal limits  SALICYLATE LEVEL  CBC    EKG  EKG Interpretation None       Radiology No results found.  Procedures Procedures (including critical care time)  Medications Ordered in ED Medications - No data to display   Initial Impression / Assessment and Plan / ED Course  I have reviewed the triage vital signs and the nursing notes.  Pertinent labs & imaging results that were available during my care of the patient  were reviewed by me and considered in my medical decision making (see chart for details).  Clinical Course    Patient presents voluntarily with stated SI with plan. He is calm and cooperative. H/O SI with attempt. TTS consult requested for further treatment.   Final Clinical Impressions(s) / ED Diagnoses   Final diagnoses:  None  1. Suicidal ideation  New Prescriptions New Prescriptions   No medications on file  I personally performed the services described in this documentation, which was scribed in my presence. The recorded information has been reviewed and is accurate.     Elpidio Anis, PA-C 05/03/16 2155    Lyndal Pulley, MD 05/04/16 [phone]

## 2016-05-03 NOTE — BH Assessment (Signed)
Tele Assessment Note   Edwin Martinez is an 30 y.o. male Presenting to [facility] reporting to suicidal ideations with a plan to overdose on his prescription medication. Pt reported that he has attempted suicide multiple times in his past with the most recent time being in June. Pt did not report any self-injurious behaviors but shared that he has had multiple psychiatric hospitalizations. Pt reported at he is scheduled to follow up with [facility]; however he has been unable to attend his appointments due to transportation issues. Pt's UDS is positive for cocaine and marijuana. PT denies HI and psychosis at this time.   Inpatient treatment is recommended for psychiatric stabilization.    Diagnosis: Bipolar 1, mixed, most recent depressed   Past Medical History:  Past Medical History:  Diagnosis Date  . ADHD (attention deficit hyperactivity disorder)   . Anxiety   . Asthma   . Bipolar 1 disorder (HCC)   . Bipolar disorder (HCC)   . Epileptic seizures (HCC)   . HIV (human immunodeficiency virus infection) (HCC)   . Hypertension   . Schizophrenia (HCC)   . Seizures (HCC)     Past Surgical History:  Procedure Laterality Date  . DENTAL SURGERY      Family History:  Family History  Problem Relation Age of Onset  . Huntington's disease Father   . Heart disease Mother     Social History:  reports that he has been smoking Cigarettes.  He started smoking about 24 years ago. He has been smoking about 0.50 packs per day. He does not have any smokeless tobacco history on file. He reports that he drinks about 48.0 oz of alcohol per week . He reports that he uses drugs, including "Crack" cocaine, about 2 times per week.  Additional Social History:  Alcohol / Drug Use History of alcohol / drug use?: Yes Substance #1 Name of Substance 1: Cocaine 1 - Age of First Use: 15 1 - Amount (size/oz): varies 1 - Frequency: 2 or more a week 1 - Duration: ongoing 1 - Last Use / Amount: unknown  Substance  #2 Name of Substance 2: Alcohol 2 - Age of First Use: 15 2 - Amount (size/oz): 40ounce or more 2 - Frequency: 2 or more times a week 2 - Duration: ongoing 2 - Last Use / Amount: unknown  Substance #3 Name of Substance 3: THC 3 - Age of First Use: 15 3 - Amount (size/oz): varies 3 - Frequency: varies 3 - Duration: ongoing 3 - Last Use / Amount: unknown   CIWA: CIWA-Ar BP: 118/77 Pulse Rate: (!) 55 COWS:    PATIENT STRENGTHS: (choose at least two) Average or above average intelligence Motivation for treatment/growth  Allergies:  Allergies  Allergen Reactions  . Magnesium-Containing Compounds Other (See Comments)    This medication is contraindicated with pts HIV meds.    . Peanut-Containing Drug Products Anaphylaxis  . Atripla [Efavirenz-Emtricitab-Tenofovir] Other (See Comments)    Reaction:  Suicidal thoughts   . Esomeprazole Magnesium Cough  . Bactrim [Sulfamethoxazole-Trimethoprim] Rash  . Penicillins Rash and Other (See Comments)    Has patient had a PCN reaction causing immediate rash, facial/tongue/throat swelling, SOB or lightheadedness with hypotension: Yes Has patient had a PCN reaction causing severe rash involving mucus membranes or skin necrosis: No Has patient had a PCN reaction that required hospitalization No Has patient had a PCN reaction occurring within the last 10 years: No If all of the above answers are "NO", then may proceed with  Cephalosporin use.    Home Medications:  (Not in a hospital admission)  OB/GYN Status:  No LMP for male patient.  General Assessment Data [facility] of [facility]: [facility] T[facility] Assessment: In system Is this a Tele or Face-to-Face Assessment?: Face-to-Face Is this an Initial Assessment or a Re-assessment for this encounter?: Initial Assessment Marital status: Other (comment) (Has Partner) Living Arrangements: Alone Can pt return to current living arrangement?: Yes Admission Status: Voluntary Is patient capable of signing  voluntary admission?: Yes Referral Source: Self/Family/Friend Insurance type: None      Crisis Care Plan Living Arrangements: Alone Name of Psychiatrist: Vesta Mixer Name of Therapist: [name]     Risk to self with the past 6 months Suicidal Ideation: Yes-Currently Present Has patient been a risk to self within the past 6 months prior to admission? : Yes Suicidal Intent: Yes-Currently Present Has patient had any suicidal intent within the past 6 months prior to admission? : Yes Is patient at risk for suicide?: Yes Suicidal Plan?: Yes-Currently Present Has patient had any suicidal plan within the past 6 months prior to admission? : Yes Specify Current Suicidal Plan: overdose Access to Means: Yes Specify Access to Suicidal Means: prescription  What has been your use of drugs/alcohol within the last 12 months?: Alcohol, cocaine and THC.  Previous Attempts/Gestures: Yes How many times?: 9 Other Self Harm Risks: denies  Triggers for Past Attempts: Family contact, Other personal contacts, Unpredictable Intentional Self Injurious Behavior: None Family Suicide History: Unknown Recent stressful life event(s): Conflict (Comment), Loss (Comment), Other (Comment) Persecutory voices/beliefs?: No Depression: Yes Depression Symptoms: Despondent, Insomnia, Tearfulness, Isolating, Guilt, Fatigue, Feeling worthless/self pity Substance abuse history and/or treatment for substance abuse?: Yes Suicide prevention information given to non-admitted patients: Not applicable  Risk to Others within the past 6 months Homicidal Ideation: No Does patient have any lifetime risk of violence toward others beyond the six months prior to admission? : No Thoughts of Harm to Others: No Current Homicidal Intent: No Current Homicidal Plan: No Access to Homicidal Means: No Identified Victim: N/A History of harm to others?: No Assessment of Violence: None Noted Violent Behavior Description: No violent behaviors  observed.  Does patient have access to weapons?: No Criminal Charges Pending?: No Does patient have a court date: No Is patient on probation?: No  Psychosis Hallucinations: None noted Delusions: None noted  Mental Status Report Appearance/Hygiene: In scrubs Eye Contact: Fair Motor Activity: Freedom of movement Speech: Logical/coherent Level of Consciousness: Alert Mood: Anxious Affect: Anxious, Appropriate to circumstance Anxiety Level: Moderate Thought Processes: Coherent, Relevant Judgement: Unimpaired Orientation: Person, Place, Time, Situation, Appropriate for developmental age Obsessive Compulsive Thoughts/Behaviors: None  Cognitive Functioning Concentration: Fair Memory: Recent Intact, Remote Intact IQ: Average Insight: Fair Impulse Control: Fair Appetite: Good Sleep: Decreased Total Hours of Sleep: 4 Vegetative Symptoms: Staying in bed  ADLScreening [facility] Assessment Services) Patient's cognitive ability adequate to safely complete daily activities?: Yes Patient able to express need for assistance with ADLs?: Yes Independently performs ADLs?: Yes (appropriate for developmental age)  Prior Inpatient Therapy Prior Inpatient Therapy: Yes Prior Therapy Dates: 2001 and more Prior Therapy Facilty/Provider(s): [facility], [facility] Reason for Treatment: Bipolar, SI  Prior Outpatient Therapy Prior Outpatient Therapy: Yes Prior Therapy Dates: current Prior Therapy Facilty/Provider(s): [name] Reason for Treatment: Bipolar Does patient have an ACCT team?: No Does patient have Intensive In-House Services?  : No Does patient have [name] services? : Yes Does patient have P4CC services?: No  ADL Screening (condition at time of admission) Patient's cognitive  ability adequate to safely complete daily activities?: Yes Is the patient deaf or have difficulty hearing?: No Does the patient have difficulty seeing, even when wearing glasses/contacts?: No Does the patient have  difficulty concentrating, remembering, or making decisions?: No Patient able to express need for assistance with ADLs?: Yes Does the patient have difficulty dressing or bathing?: No Independently performs ADLs?: Yes (appropriate for developmental age)       Abuse/Neglect Assessment (Assessment to be complete while patient is alone) Physical Abuse: Denies Verbal Abuse: Denies Sexual Abuse: Denies Exploitation of patient/patient's resources: Denies Self-Neglect: Denies     Merchant navy officerAdvance Directives (For Healthcare) Does patient have an advance directive?: No    Additional Information 1:1 In Past 12 Months?: Yes CIRT Risk: No Elopement Risk: No Does patient have medical clearance?: Yes     Disposition:  Disposition Initial Assessment Completed for this Encounter: Yes Disposition of Patient: Inpatient treatment program Type of inpatient treatment program: Adult  Braylie Badami S 05/03/2016 10:24 PM

## 2016-05-04 ENCOUNTER — Encounter (HOSPITAL_COMMUNITY): Payer: Self-pay

## 2016-05-04 ENCOUNTER — Observation Stay (HOSPITAL_COMMUNITY)
Admission: AD | Admit: 2016-05-04 | Discharge: 2016-05-06 | Disposition: A | Discharge status: Home / Self Care | Payer: Federal, State, Local not specified - Other | Source: Intra-hospital | Attending: Psychiatry | Admitting: Psychiatry

## 2016-05-04 DIAGNOSIS — G40909 Epilepsy, unspecified, not intractable, without status epilepticus: Secondary | ICD-10-CM | POA: Insufficient documentation

## 2016-05-04 DIAGNOSIS — F17210 Nicotine dependence, cigarettes, uncomplicated: Secondary | ICD-10-CM | POA: Insufficient documentation

## 2016-05-04 DIAGNOSIS — F3131 Bipolar disorder, current episode depressed, mild: Secondary | ICD-10-CM | POA: Diagnosis present

## 2016-05-04 DIAGNOSIS — Z79899 Other long term (current) drug therapy: Secondary | ICD-10-CM | POA: Insufficient documentation

## 2016-05-04 DIAGNOSIS — R45851 Suicidal ideations: Secondary | ICD-10-CM

## 2016-05-04 DIAGNOSIS — F3130 Bipolar disorder, current episode depressed, mild or moderate severity, unspecified: Secondary | ICD-10-CM

## 2016-05-04 DIAGNOSIS — J45990 Exercise induced bronchospasm: Secondary | ICD-10-CM | POA: Insufficient documentation

## 2016-05-04 DIAGNOSIS — F1410 Cocaine abuse, uncomplicated: Secondary | ICD-10-CM | POA: Insufficient documentation

## 2016-05-04 DIAGNOSIS — I10 Essential (primary) hypertension: Secondary | ICD-10-CM | POA: Insufficient documentation

## 2016-05-04 DIAGNOSIS — B20 Human immunodeficiency virus [HIV] disease: Secondary | ICD-10-CM | POA: Insufficient documentation

## 2016-05-04 LAB — TSH: TSH: 0.63 u[IU]/mL (ref 0.350–4.500)

## 2016-05-04 LAB — VITAMIN B12: Vitamin B-12: 502 pg/mL (ref 180–914)

## 2016-05-04 MED ORDER — BUSPIRONE HCL 5 MG PO TABS: 20.0000 mg | ORAL_TABLET | Freq: Two times a day (BID) | ORAL | Status: DC

## 2016-05-04 MED ORDER — CLOTRIMAZOLE 1 % EX CREA
TOPICAL_CREAM | Freq: Two times a day (BID) | CUTANEOUS | Status: DC
Start: 2016-05-04 — End: 2016-05-05
  Administered 2016-05-04: 18:00:00 via TOPICAL
  Administered 2016-05-05 (×2): 1 via TOPICAL
  Filled 2016-05-04: qty 15

## 2016-05-04 MED ORDER — ZIPRASIDONE HCL 20 MG PO CAPS
20.0000 mg | ORAL_CAPSULE | Freq: Two times a day (BID) | ORAL | Status: DC
  Administered 2016-05-04 – 2016-05-05 (×2): 20 mg via ORAL
  Filled 2016-05-04 (×2): qty 1

## 2016-05-04 MED ORDER — CLOTRIMAZOLE 1 % EX CREA
TOPICAL_CREAM | Freq: Two times a day (BID) | CUTANEOUS | Status: DC
  Administered 2016-05-04: 1 via TOPICAL
  Filled 2016-05-04: qty 15

## 2016-05-04 MED ORDER — GABAPENTIN 300 MG PO CAPS
300.0000 mg | ORAL_CAPSULE | Freq: Three times a day (TID) | ORAL | Status: DC
  Administered 2016-05-04 – 2016-05-06 (×6): 300 mg via ORAL
  Filled 2016-05-04 (×6): qty 1

## 2016-05-04 MED ORDER — ACETAMINOPHEN 325 MG PO TABS: 650.0000 mg | ORAL_TABLET | ORAL | Status: DC | PRN

## 2016-05-04 MED ORDER — BUSPIRONE HCL 5 MG PO TABS
10.0000 mg | ORAL_TABLET | Freq: Two times a day (BID) | ORAL | Status: DC
  Administered 2016-05-04 – 2016-05-06 (×4): 10 mg via ORAL
  Filled 2016-05-04 (×4): qty 2

## 2016-05-04 MED ORDER — GABAPENTIN 300 MG PO CAPS
300.0000 mg | ORAL_CAPSULE | Freq: Three times a day (TID) | ORAL | Status: DC
  Administered 2016-05-04: 300 mg via ORAL
  Filled 2016-05-04: qty 1

## 2016-05-04 MED ORDER — ABACAVIR-DOLUTEGRAVIR-LAMIVUD 600-50-300 MG PO TABS
1.0000 | ORAL_TABLET | Freq: Every day | ORAL | Status: DC
  Administered 2016-05-04 – 2016-05-05 (×2): 1 via ORAL
  Filled 2016-05-04 (×4): qty 1

## 2016-05-04 MED ORDER — NICOTINE 21 MG/24HR TD PT24
21.0000 mg | MEDICATED_PATCH | Freq: Every day | TRANSDERMAL | Status: DC
  Filled 2016-05-04 (×2): qty 1

## 2016-05-04 MED ORDER — BUSPIRONE HCL 10 MG PO TABS
20.0000 mg | ORAL_TABLET | Freq: Two times a day (BID) | ORAL | Status: DC
  Administered 2016-05-04: 20 mg via ORAL
  Filled 2016-05-04: qty 2

## 2016-05-04 MED ORDER — TRAZODONE HCL 50 MG PO TABS: 150.0000 mg | ORAL_TABLET | Freq: Every evening | ORAL | Status: DC | PRN

## 2016-05-04 MED ORDER — DIVALPROEX SODIUM ER 500 MG PO TB24
500.0000 mg | ORAL_TABLET | Freq: Two times a day (BID) | ORAL | Status: DC
  Administered 2016-05-04 – 2016-05-06 (×4): 500 mg via ORAL
  Filled 2016-05-04 (×4): qty 1

## 2016-05-04 MED ORDER — ALBUTEROL SULFATE HFA 108 (90 BASE) MCG/ACT IN AERS: 2.0000 | INHALATION_SPRAY | Freq: Four times a day (QID) | RESPIRATORY_TRACT | Status: DC | PRN

## 2016-05-04 MED ORDER — TRAZODONE HCL 150 MG PO TABS
150.0000 mg | ORAL_TABLET | Freq: Every evening | ORAL | Status: DC | PRN
  Administered 2016-05-04: 150 mg via ORAL
  Filled 2016-05-04: qty 1

## 2016-05-04 NOTE — ED Notes (Signed)
Bed: WA15 Expected date:  Expected time:  Means of arrival:  Comments: 

## 2016-05-04 NOTE — Progress Notes (Addendum)
CSW spoke with patient with sitter present regarding consent forms to [facility]. Patient signed the consent form and the form to speak with various individuals. Patient stated he did not have anyone specific for consent to speak with for staff or anyone in the public at this time. Patient was given copies and originals were placed in patient's chart. CSW informed Nurse that patient is going to Observation bed #3. NP and Psychiatry Team updated.   Elenore PaddyLaVonia Damarko Stitely, LCSWA [phone] ED CSW 05/04/2016 12:31 PM

## 2016-05-04 NOTE — ED Notes (Addendum)
BELONGING BAG X 1 WITH PELHAM. CONSENT WITH [facility]

## 2016-05-04 NOTE — H&P (Signed)
Psychiatric [facility]-Observation Unit Assessment Adult  Patient Identification: Edwin Martinez MRN:  [id number] Date of Evaluation:  05/04/2016 Chief Complaint:  Patient states "I take the medications but they are not stabilizing my mood like they should be doing."  Principal Diagnosis: Bipolar affective disorder, current episode depressed (HCC) Diagnosis:   Patient Active Problem List   Diagnosis Date Noted  . Cocaine abuse [F14.10] 05/04/2016  . Bipolar affective disorder, current episode depressed (HCC) [F31.30] 05/04/2016  . Bipolar 1 disorder, mixed, severe (HCC) [F31.63] 03/08/2016  . 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 Polysubstance abuse [F19.10]   . Marijuana abuse [F12.10]   . Bipolar affective disorder, depressed, moderate degree (HCC) [F31.32] 12/21/2015  . Bipolar affect, depressed (HCC) [F31.30] 12/21/2015  . Suicidal ideation [R45.851] 09/14/2014  . Depression [F32.9]   . Suicidal ideations [R45.851]   . Bipolar I disorder, most recent episode depressed (HCC) [F31.30] 05/24/2014  . GAD (generalized anxiety disorder) [F41.1] 05/24/2014  . MDD (major depressive disorder) (HCC) [F32.9] 05/23/2014  . Suicide attempt (HCC) [T14.91] 05/23/2014  . MDD (major depressive disorder), recurrent episode, severe (HCC) [F33.2] 05/23/2014  . Transaminitis [R74.0] 03/20/2014  . ADHD (attention deficit hyperactivity disorder) [F90.9] 09/12/2012  . INSOMNIA, CHRONIC [G47.00] 05/20/2007  . CARPAL TUNNEL SYNDROME [G56.00] 05/20/2007  . 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 HIV DISEASE [B20] 05/05/2007  . 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 Patient reports that he is currently taking medications as prescribed but that they are not  controlling his symptoms. He states that he uses Marijuana and drinks one to two 40 ounce beers once or twice weekly.  He reports that he has been diagnosed as HIV+.  Patient reports eight previous suicide attempts and inpatient hospitalizations.He was hospitalized in June of 2017 at [facility] after a serious overdose. Patient reports initial dx Bipolar disorder was in 2001 where is was placed inpatient at [facility]. Patient reports suicidal thoughts to "walk in front of a car." Patient currently denies HI and A/VH. Patient reports current substance use includes: Alcohol-once or twice per week, Cocaine-occasional use, marijuana on weekly basis. The patient is adamant that his substance use is not worsening his symptoms reporting they were no controlled after his last discharge [facility]. There is no evidence of any withdrawal symptoms during today's assessment. Patient states "I take the medicines and nothing happens. Yes I have everyday stressors but I can't relax at all. It's getting worse. I was getting irritable with my partner and just easily agitated. I have an apartment I stay at and live alone. I have been to [facility] but said I needed to come back to the hospital." He reports taking antidepressants in the past with worsening of symptoms such as Prozac.   Depression Symptoms:  depressed mood, psychomotor agitation, fatigue, feelings of worthlessness/guilt, difficulty concentrating, hopelessness, recurrent thoughts of death, suicidal thoughts with specific plan, anxiety, panic attacks, loss of energy/fatigue, (Hypo) Manic Symptoms:  Impulsivity, Irritable Mood, Labiality of Mood, Anxiety Symptoms:  Excessive Worry, Social Anxiety, Psychotic Symptoms:  Hallucinations: None PTSD Symptoms: Hyperarousal:  Difficulty Concentrating Irritability/Anger Total Time spent with patient: 30 minutes  Past Psychiatric History: See Above  Is the patient at risk to self? Yes.    Has the patient been a risk  to self in the past 6 months? Yes.   Had a serious overdose in June 2017  Has the patient been a risk to self within the distant past? Yes.    Is the patient a risk to others? No.  Has the patient been a risk to others in the past 6 months? No.  Has the patient been a risk to others within the distant past? Yes.     Prior Inpatient Therapy:   yes  Prior Outpatient Therapy:  yes  Alcohol Screening: Patient refused Alcohol Screening Tool: Yes 1. How often do you have a drink containing alcohol?: Never 2. How many drinks containing alcohol do you have on a typical day when you are drinking?: 1 or 2 3. How often do you have six or more drinks on one occasion?: Less than monthly Preliminary Score: 1 4. How often during the last year have you found that you were not able to stop drinking once you had started?: Never 6. How often during the last year have you needed a first drink in the morning to get yourself going after a heavy drinking session?: Never 7. How often during the last year have you had a feeling of guilt of remorse after drinking?: Never 8. How often during the last year have you been unable to remember what happened the night before because you had been drinking?: Never 9. Have you or someone else been injured as a result of your drinking?: No 10. Has a relative or friend or a doctor or another health worker been concerned about your drinking or suggested you cut down?: No Alcohol Use Disorder Identification Test Final Score (AUDIT): 1 Brief Intervention: AUDIT score less than 7 or less-screening does not suggest unhealthy drinking-brief intervention not indicated Substance Abuse History in the last 12 months:  Yes.   Consequences of Substance Abuse: Withdrawal Symptoms:   None Previous Psychotropic Medications: Yes Psychological Evaluations:Yes Past Medical History:  Past Medical History:  Diagnosis Date  . ADHD (attention deficit hyperactivity disorder)   . Anxiety   .  Asthma   . Bipolar 1 disorder (HCC)   . Bipolar disorder (HCC)   . Epileptic seizures (HCC)   . HIV (human immunodeficiency virus infection) (HCC)   . Hypertension   . Schizophrenia (HCC)   . Seizures (HCC)     Past Surgical History:  Procedure Laterality Date  . DENTAL SURGERY     Family History:  Family History  Problem Relation Age of Onset  . Huntington's disease Father   . Heart disease Mother    Family Psychiatric  History: See Above Tobacco Screening: Denies Social History:  History  Alcohol Use  . 1.2 oz/week  . 2 Standard drinks or equivalent per week    Comment: once week      History  Drug Use  . Frequency: 2.0 times per week  . Types: Marijuana    Comment: every other day     Additional Social History: Marital status: Single Are you sexually active?: Yes    Pain Medications: see chart  Prescriptions: see chart Over the Counter: see chart History of alcohol / drug use?: Yes Longest period of sobriety (when/how long): 6 months Negative Consequences of Use: Financial, Personal relationships Withdrawal Symptoms: Irritability                    Allergies:   Allergies  Allergen Reactions  . Magnesium-Containing Compounds Other (See Comments)    This medication is contraindicated  with pts HIV meds.    . Peanut-Containing Drug Products Anaphylaxis  . Atripla [Efavirenz-Emtricitab-Tenofovir] Other (See Comments)    Reaction:  Suicidal thoughts   . Esomeprazole Magnesium Cough  . Bactrim [Sulfamethoxazole-Trimethoprim] Rash  . Penicillins Rash and Other (See Comments)    Has patient had a PCN reaction causing immediate rash, facial/tongue/throat swelling, SOB or lightheadedness with hypotension: Yes Has patient had a PCN reaction causing severe rash involving mucus membranes or skin necrosis: No Has patient had a PCN reaction that required hospitalization No Has patient had a PCN reaction occurring within the last 10 years: No If all of the  above answers are "NO", then may proceed with Cephalosporin use.   Lab Results:  No results found for this or any previous visit (from the past 48 hour(s)).  Blood Alcohol level:  Lab Results  Component Value Date   ETH 87 (H) 05/03/2016   ETH 51 (H) 03/07/2016    Metabolic Disorder Labs:  No results found for: HGBA1C, MPG No results found for: PROLACTIN Lab Results  Component Value Date   CHOL 136 02/05/2015   TRIG 147 02/05/2015   HDL 54 02/05/2015   CHOLHDL 2.5 02/05/2015   VLDL 29 02/05/2015   LDLCALC 53 02/05/2015   LDLCALC 45 02/21/2014    Current Medications: Current Facility-Administered Medications  Medication Dose Route Frequency Provider Last Rate Last Dose  . abacavir-dolutegravir-lamiVUDine (TRIUMEQ) 600-50-300 MG per tablet 1 tablet  1 tablet Oral QHS Charm RingsJamison Y Lord, NP      . acetaminophen (TYLENOL) tablet 650 mg  650 mg Oral Q4H PRN Charm RingsJamison Y Lord, NP      . albuterol (PROVENTIL HFA;VENTOLIN HFA) 108 (90 Base) MCG/ACT inhaler 2 puff  2 puff Inhalation Q6H PRN Charm RingsJamison Y Lord, NP      . busPIRone (BUSPAR) tablet 10 mg  10 mg Oral BID Thermon LeylandLaura A Leeasia Secrist, NP      . clotrimazole (LOTRIMIN) 1 % cream   Topical BID Charm RingsJamison Y Lord, NP      . divalproex (DEPAKOTE ER) 24 hr tablet 500 mg  500 mg Oral BID Charm RingsJamison Y Lord, NP      . gabapentin (NEURONTIN) capsule 300 mg  300 mg Oral TID Charm RingsJamison Y Lord, NP      . Melene Muller[START ON 05/05/2016] nicotine (NICODERM CQ - dosed in mg/24 hours) patch 21 mg  21 mg Transdermal Daily Charm RingsJamison Y Lord, NP      . traZODone (DESYREL) tablet 150 mg  150 mg Oral QHS PRN Charm RingsJamison Y Lord, NP      . ziprasidone (GEODON) capsule 20 mg  20 mg Oral BID WC Thermon LeylandLaura A Lacinda Curvin, NP       PTA Medications: Prescriptions Prior to Admission  Medication Sig Dispense Refill Last Dose  . abacavir-dolutegravir-lamiVUDine (TRIUMEQ) 600-50-300 MG tablet Take 1 tablet by mouth at bedtime.   05/02/2016 at Unknown time  . albuterol (PROVENTIL HFA;VENTOLIN HFA) 108 (90 Base) MCG/ACT  inhaler Inhale 2 puffs into the lungs every 6 (six) hours as needed for wheezing or shortness of breath.   Past Month at Unknown time  . busPIRone (BUSPAR) 10 MG tablet Take 20 mg by mouth 2 (two) times daily.   Past Month at Unknown time  . divalproex (DEPAKOTE ER) 500 MG 24 hr tablet Take 500 mg by mouth 2 (two) times daily.   05/03/2016 at Unknown time  . FLUoxetine (PROZAC) 10 MG capsule Take 10 mg by mouth at bedtime.   Past Month  at Unknown time  . gabapentin (NEURONTIN) 300 MG capsule Take 300 mg by mouth 3 (three) times daily.   Past Month at Unknown time  . nicotine (NICODERM CQ - DOSED IN MG/24 HR) 7 mg/24hr patch Place 1 patch (7 mg total) onto the skin daily. (Patient not taking: Reported on 05/03/2016) 28 patch 0   . traZODone (DESYREL) 150 MG tablet Take 1 tablet (150 mg total) by mouth at bedtime as needed for sleep. 30 tablet 0 Past Month at Unknown time    Musculoskeletal: Strength & Muscle Tone: within normal limits Gait & Station: normal Patient leans: N/A  Psychiatric Specialty Exam: Physical Exam  Nursing note and vitals reviewed. Constitutional: He is oriented to person, place, and time. He appears well-developed.  HENT:  Head: Normocephalic.  Cardiovascular: Normal rate.   Musculoskeletal: Normal range of motion.  Neurological: He is alert and oriented to person, place, and time.  Psychiatric: He has a normal mood and affect. His behavior is normal.    Review of Systems  Psychiatric/Behavioral: Positive for depression, substance abuse and suicidal ideas. Negative for hallucinations and memory loss. The patient is nervous/anxious and has insomnia.   All other systems reviewed and are negative.   Blood pressure 125/81, pulse (!) 53, temperature 97.9 F (36.6 C), temperature source Oral, resp. rate 16, height  (1.727 m), weight 69.9 kg (154 lb), SpO2 99 %.Body mass index is 23.42 kg/m.  General Appearance: Casual  Eye Contact:  Good  Speech:  Clear and Coherent   Volume:  Normal  Mood:  Anxious  Affect:  Congruent  Thought Process:  Linear  Orientation:  Full (Time, Place, and Person)  Thought Content:  Hallucinations: None  Suicidal Thoughts:  Yes.  with intent/plan  Homicidal Thoughts:  No  Memory:  Immediate;   Fair Remote;   Fair  Judgement:  Fair  Insight:  Lacking  Psychomotor Activity:  Normal  Concentration:  Concentration: Fair  Recall:  [employer] of Knowledge:  Fair  Language:  Good  Akathisia:  No  Handed:  Right  AIMS (if indicated):     Assets:  Communication Skills Desire for Improvement Housing Leisure Time Resilience  ADL's:  Intact  Cognition:  WNL  Sleep:      Treatment Plan Summary: Daily contact with patient to assess and evaluate symptoms and progress in treatment and Medication management  Decrease Buspar to 10 mg PO BID due to possible adverse reactions (hostility, depression) Continue Depakote ER 500 mg PO BID, Neurontin 300 mg TID for anxiety, Start Geodon 20 mg bid for mood stabilization (EKG reviewed from 03/07/2016 indicating no QTc prolongation, sinus rhythm)  Continue with Trazodone 150 mg hs prn for insomnia. Continue HIV medications as ordered, patient follows up with infectious disease.  Will continue to monitor vitals ,medication compliance and treatment side effects while patient is here.  Reviewed labs:BAL - 87, UDS - pos for cocaine, marijuana   Observation Level/Precautions:  Continuous Observation  Laboratory:  CBC Chemistry Profile UDS UA Add TSH, Vitamin B-12, Hemoglobin A1C  Psychotherapy:  Individual  Medications:  See Above  Consultations:  Psychiatry  Discharge Concerns:  Safety, stabilization, and risk of access to medication and medication stabilization   Estimated LOS: 24-48 hours  Other:      Fransisca Kaufmann, NP 8/7/20172:34 PM

## 2016-05-04 NOTE — Progress Notes (Signed)
Patient appeared pleasant at the beginning of the shift. He later went to sleep and slept for most part of the evening. He denied SI/HI and denied Hallucinations. Writer encouraged and supported patient. Safety maintained.

## 2016-05-04 NOTE — Consult Note (Signed)
[facility] Consult   Reason for Consult:  Suicidal ideations Referring Physician:  EDP Patient Identification: Edwin Martinez MRN:  [id number] Principal Diagnosis: Bipolar I disorder, most recent episode depressed [facility]) Diagnosis:   Patient Active Problem List   Diagnosis Date Noted  . Cocaine abuse [F14.10] 05/04/2016    Priority: High  . Bipolar I disorder, most recent episode depressed ([place]) [F31.30] 05/24/2014    Priority: High  . Bipolar 1 disorder, mixed, severe ([place]) [F31.63] 03/08/2016  . Cocaine abuse with cocaine-induced mood disorder ([place]) [F14.14]   . Polysubstance abuse [F19.10]   . Marijuana abuse [F12.10]   . Bipolar affective disorder, depressed, moderate degree ([place]) [F31.32] 12/21/2015  . Bipolar affect, depressed ([place]) [F31.30] 12/21/2015  . Suicidal ideation [R45.851] 09/14/2014  . Depression [F32.9]   . Suicidal ideations [R45.851]   . GAD (generalized anxiety disorder) [F41.1] 05/24/2014  . MDD (major depressive disorder) ([place]) [F32.9] 05/23/2014  . Suicide attempt ([place]) [T14.91] 05/23/2014  . MDD (major depressive disorder), recurrent episode, severe ([place]) [F33.2] 05/23/2014  . Transaminitis [R74.0] 03/20/2014  . ADHD (attention deficit hyperactivity disorder) [F90.9] 09/12/2012  . INSOMNIA, CHRONIC [G47.00] 05/20/2007  . CARPAL TUNNEL SYNDROME [G56.00] 05/20/2007  . 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 HIV DISEASE [B20] 05/05/2007  . 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 male patient admitted with depression and anxiety, passive suicidal ideations.  HPI:  30 yo male who presented to the ED stating his medications are not working but he has not been taking them.  He then said they were not working prior to stopping.  He denies homicidal ideations but has some passive suicidal ideations at times, no  hallucinations, denies withdrawal symptoms.  His medications may have stopped working due to his cocaine abuse.  Medications adjusted and transferred to [facility] Obs.  Past Psychiatric History: bipolar disorder, substance abuse  Risk to Self: Suicidal Ideation: Yes-Currently Present Suicidal Intent: Yes-Currently Present Is patient at risk for suicide?: Yes Suicidal Plan?: Yes-Currently Present Specify Current Suicidal Plan: overdose Access to Means: Yes Specify Access to Suicidal Means: prescription  What has been your use of drugs/alcohol within the last 12 months?: Alcohol, cocaine and THC.  How many times?: 9 Other Self Harm Risks: denies  Triggers for Past Attempts: Family contact, Other personal contacts, Unpredictable Intentional Self Injurious Behavior: None Risk to Others: Homicidal Ideation: No Thoughts of Harm to Others: No Current Homicidal Intent: No Current Homicidal Plan: No Access to Homicidal Means: No Identified Victim: N/A History of harm to others?: No Assessment of Violence: None Noted Violent Behavior Description: No violent behaviors observed.  Does patient have access to weapons?: No Criminal Charges Pending?: No Does patient have a court date: No Prior Inpatient Therapy: Prior Inpatient Therapy: Yes Prior Therapy Dates: 2001 and more Prior Therapy Facilty/Provider(s): [facility] Reason for Treatment: Bipolar, SI Prior Outpatient Therapy: Prior Outpatient Therapy: Yes Prior Therapy Dates: current Prior Therapy Facilty/Provider(s): [name] Reason for Treatment: Bipolar Does patient have an ACCT team?: No Does patient have Intensive In-House Services?  : No Does patient have [name] services? : Yes Does patient have P4CC services?: No  Past Medical History:  Past Medical History:  Diagnosis Date  . ADHD (attention deficit hyperactivity disorder)   . Anxiety   . Asthma   . Bipolar 1 disorder ([place])   . Bipolar disorder ([place])   .  Epileptic seizures  ([place])   . HIV (human immunodeficiency virus infection) ([place])   . Hypertension   . Schizophrenia ([place])   . Seizures ([place])     Past Surgical History:  Procedure Laterality Date  . DENTAL SURGERY     Family History:  Family History  Problem Relation Age of Onset  . Huntington's disease Father   . Heart disease Mother    Family Psychiatric  History: none Social History:  History  Alcohol Use  . 48.0 oz/week  . 80 Standard drinks or equivalent per week    Comment: once week      History  Drug Use  . Frequency: 2.0 times per week  . Types: "Crack" cocaine    Comment: every other day     Social History   Social History  . Marital status: Single    Spouse name: N/A  . Number of children: N/A  . Years of education: N/A   Social History Main Topics  . Smoking status: Current Every Day Smoker    Packs/day: 0.50    Types: Cigarettes    Start date: 09/29/1991  . Smokeless tobacco: Not on file  . Alcohol use 48.0 oz/week    80 Standard drinks or equivalent per week     Comment: once week   . Drug use:     Frequency: 2.0 times per week    Types: "Crack" cocaine     Comment: every other day   . Sexual activity: Yes    Birth control/ protection: Condom   Other Topics Concern  . Not on file   Social History Narrative   ** Merged History Encounter **       Additional Social History:    Allergies:   Allergies  Allergen Reactions  . Magnesium-Containing Compounds Other (See Comments)    This medication is contraindicated with pts HIV meds.    . Peanut-Containing Drug Products Anaphylaxis  . Atripla [Efavirenz-Emtricitab-Tenofovir] Other (See Comments)    Reaction:  Suicidal thoughts   . Esomeprazole Magnesium Cough  . Bactrim [Sulfamethoxazole-Trimethoprim] Rash  . Penicillins Rash and Other (See Comments)    Has patient had a PCN reaction causing immediate rash, facial/tongue/throat swelling, SOB or lightheadedness with hypotension: Yes Has patient had a PCN  reaction causing severe rash involving mucus membranes or skin necrosis: No Has patient had a PCN reaction that required hospitalization No Has patient had a PCN reaction occurring within the last 10 years: No If all of the above answers are "NO", then may proceed with Cephalosporin use.    Labs:  Results for orders placed or performed during the hospital encounter of 05/03/16 (from the past 48 hour(s))  Comprehensive metabolic panel     Status: Abnormal   Collection Time: 05/03/16  7:53 PM  Result Value Ref Range   Sodium 137 135 - 145 mmol/L   Potassium 3.6 3.5 - 5.1 mmol/L   Chloride 107 101 - 111 mmol/L   CO2 22 22 - 32 mmol/L   Glucose, Bld 86 65 - 99 mg/dL   BUN 14 6 - 20 mg/dL   Creatinine, Ser 0.98 0.61 - 1.24 mg/dL   Calcium 8.9 8.9 - 10.3 mg/dL   Total Protein 7.7 6.5 - 8.1 g/dL   Albumin 4.5 3.5 - 5.0 g/dL   AST 23 15 - 41 U/L   ALT 15 (L) 17 - 63 U/L   Alkaline Phosphatase 56 38 - 126 U/L   Total Bilirubin 0.5 0.3 - 1.2  mg/dL   GFR calc non Af Amer >60 >60 mL/min   GFR calc Af Amer >60 >60 mL/min    Comment: (NOTE) The eGFR has been calculated using the CKD EPI equation. This calculation has not been validated in all clinical situations. eGFR's persistently <60 mL/min signify possible Chronic Kidney Disease.    Anion gap 8 5 - 15  Ethanol     Status: Abnormal   Collection Time: 05/03/16  7:53 PM  Result Value Ref Range   Alcohol, Ethyl (B) 87 (H) <5 mg/dL    Comment:        LOWEST DETECTABLE LIMIT FOR SERUM ALCOHOL IS 5 mg/dL FOR MEDICAL PURPOSES ONLY   Salicylate level     Status: None   Collection Time: 05/03/16  7:53 PM  Result Value Ref Range   Salicylate Lvl <3.4 2.8 - 30.0 mg/dL  Acetaminophen level     Status: Abnormal   Collection Time: 05/03/16  7:53 PM  Result Value Ref Range   Acetaminophen (Tylenol), Serum <10 (L) 10 - 30 ug/mL    Comment:        THERAPEUTIC CONCENTRATIONS VARY SIGNIFICANTLY. A RANGE OF 10-30 ug/mL MAY BE AN  EFFECTIVE CONCENTRATION FOR MANY PATIENTS. HOWEVER, SOME ARE BEST TREATED AT CONCENTRATIONS OUTSIDE THIS RANGE. ACETAMINOPHEN CONCENTRATIONS >150 ug/mL AT 4 HOURS AFTER INGESTION AND >50 ug/mL AT 12 HOURS AFTER INGESTION ARE OFTEN ASSOCIATED WITH TOXIC REACTIONS.   cbc     Status: None   Collection Time: 05/03/16  7:53 PM  Result Value Ref Range   WBC 9.0 4.0 - 10.5 K/uL   RBC 5.07 4.22 - 5.81 MIL/uL   Hemoglobin 15.2 13.0 - 17.0 g/dL   HCT 45.2 39.0 - 52.0 %   MCV 89.2 78.0 - 100.0 fL   MCH 30.0 26.0 - 34.0 pg   MCHC 33.6 30.0 - 36.0 g/dL   RDW 13.8 11.5 - 15.5 %   Platelets 273 150 - 400 K/uL  Rapid urine drug screen (hospital performed)     Status: Abnormal   Collection Time: 05/03/16  7:57 PM  Result Value Ref Range   Opiates NONE DETECTED NONE DETECTED   Cocaine POSITIVE (A) NONE DETECTED   Benzodiazepines NONE DETECTED NONE DETECTED   Amphetamines NONE DETECTED NONE DETECTED   Tetrahydrocannabinol POSITIVE (A) NONE DETECTED   Barbiturates NONE DETECTED NONE DETECTED    Comment:        DRUG SCREEN FOR MEDICAL PURPOSES ONLY.  IF CONFIRMATION IS NEEDED FOR ANY PURPOSE, NOTIFY LAB WITHIN 5 DAYS.        LOWEST DETECTABLE LIMITS FOR URINE DRUG SCREEN Drug Class       Cutoff (ng/mL) Amphetamine      1000 Barbiturate      200 Benzodiazepine   287 Tricyclics       681 Opiates          300 Cocaine          300 THC              50     Current Facility-Administered Medications  Medication Dose Route Frequency Provider Last Rate Last Dose  . abacavir-dolutegravir-lamiVUDine (TRIUMEQ) 157-26-203 MG per tablet 1 tablet  1 tablet Oral QHS Charlann Lange, PA-C   1 tablet at 05/03/16 2337  . acetaminophen (TYLENOL) tablet 650 mg  650 mg Oral Q4H PRN Charlann Lange, PA-C   650 mg at 05/03/16 2338  . albuterol (PROVENTIL HFA;VENTOLIN HFA) 108 (90 Base) MCG/ACT inhaler 2  puff  2 puff Inhalation Q6H PRN Charlann Lange, PA-C      . busPIRone (BUSPAR) tablet 20 mg  20 mg Oral BID  Patrecia Pour, NP      . clotrimazole (LOTRIMIN) 1 % cream   Topical BID Charlann Lange, PA-C   1 application at 62/69/48 0132  . divalproex (DEPAKOTE ER) 24 hr tablet 500 mg  500 mg Oral BID Charlann Lange, PA-C   500 mg at 05/03/16 2338  . gabapentin (NEURONTIN) capsule 300 mg  300 mg Oral TID Patrecia Pour, NP      . nicotine (NICODERM CQ - dosed in mg/24 hours) patch 21 mg  21 mg Transdermal Daily Charlann Lange, PA-C   21 mg at 05/03/16 2337  . traZODone (DESYREL) tablet 150 mg  150 mg Oral QHS PRN Patrecia Pour, NP       Current Outpatient Prescriptions  Medication Sig Dispense Refill  . abacavir-dolutegravir-lamiVUDine (TRIUMEQ) 600-50-300 MG tablet Take 1 tablet by mouth at bedtime.    Marland Kitchen albuterol (PROVENTIL HFA;VENTOLIN HFA) 108 (90 Base) MCG/ACT inhaler Inhale 2 puffs into the lungs every 6 (six) hours as needed for wheezing or shortness of breath.    . busPIRone (BUSPAR) 10 MG tablet Take 20 mg by mouth 2 (two) times daily.    . divalproex (DEPAKOTE ER) 500 MG 24 hr tablet Take 500 mg by mouth 2 (two) times daily.    Marland Kitchen FLUoxetine (PROZAC) 10 MG capsule Take 10 mg by mouth at bedtime.    . gabapentin (NEURONTIN) 300 MG capsule Take 300 mg by mouth 3 (three) times daily.    . traZODone (DESYREL) 150 MG tablet Take 1 tablet (150 mg total) by mouth at bedtime as needed for sleep. 30 tablet 0  . nicotine (NICODERM CQ - DOSED IN MG/24 HR) 7 mg/24hr patch Place 1 patch (7 mg total) onto the skin daily. (Patient not taking: Reported on 05/03/2016) 28 patch 0    Musculoskeletal: Strength & Muscle Tone: within normal limits Gait & Station: normal Patient leans: N/A  Psychiatric Specialty Exam: Physical Exam  Constitutional: He is oriented to person, place, and time. He appears well-developed and well-nourished.  HENT:  Head: Normocephalic.  Neck: Normal range of motion.  Respiratory: Effort normal.  Musculoskeletal: Normal range of motion.  Neurological: He is alert and oriented to  person, place, and time.  Skin: Skin is warm and dry.  Psychiatric: His speech is normal and behavior is normal. Judgment and thought content normal. His mood appears anxious. Cognition and memory are normal. He exhibits a depressed mood.    Review of Systems  Constitutional: Negative.   HENT: Negative.   Eyes: Negative.   Respiratory: Negative.   Cardiovascular: Negative.   Gastrointestinal: Negative.   Genitourinary: Negative.   Musculoskeletal: Negative.   Skin: Negative.   Neurological: Negative.   Endo/Heme/Allergies: Negative.   Psychiatric/Behavioral: Positive for depression. The patient is nervous/anxious.     Blood pressure 131/73, pulse 70, temperature 97.8 F (36.6 C), temperature source Oral, resp. rate 15, height 5' 7"  (1.702 m), weight 72.6 kg (160 lb), SpO2 99 %.Body mass index is 25.06 kg/m.  General Appearance: Casual  Eye Contact:  Fair  Speech:  Normal Rate  Volume:  Normal  Mood:  Depressed  Affect:  Congruent  Thought Process:  Coherent and Descriptions of Associations: Intact  Orientation:  Full (Time, Place, and Person)  Thought Content:  Rumination  Suicidal Thoughts:  Yes.  without intent/plan  Homicidal Thoughts:  No  Memory:  Immediate;   Fair Recent;   Fair Remote;   Fair  Judgement:  Fair  Insight:  Fair  Psychomotor Activity:  Decreased  Concentration:  Concentration: Fair and Attention Span: Fair  Recall:  [employer] of Knowledge:  Fair  Language:  Fair  Akathisia:  No  Handed:  Right  AIMS (if indicated):     Assets:  Leisure Time Physical Health Resilience Social Support  ADL's:  Intact  Cognition:  WNL  Sleep:        Treatment Plan Summary: Daily contact with patient to assess and evaluate symptoms and progress in treatment, Medication management and Plan bipolar affective disorder, most recent episode, depressed, mild without psychosis:  -Crisis stabilization -Medication management:  Continue medical medications along with  gabapentin 300 mg TID for anxiety, Trazodone 150 PRN for sleep, and Depakote 500 BID for mood stabilization. -Individual and substance abuse counseling  Disposition: Supportive therapy provided about ongoing stressors.  Waylan Boga, NP 05/04/2016 11:30 AM

## 2016-05-04 NOTE — Progress Notes (Addendum)
Admission Note: Ad[facility] patient, a 30 y/o male, [facility].  Patient reports that he is depressed and has been having suicidal thoughts of jumping in front of car or overdosing on medication.  He denies homicidal ideation and AVH.  He states that he feels that the medications he takes for depression are "no longer working". No self-injurious behaviors noted.  He states that in the past he tried to hang himself on ceiling fan.  He states that he uses Marijuana and drinks one to two 40 ounce beers once or twice weekly.  He reports that he has been diagnosed as HIV+.   He reports that he lives alone in an apartment and that his Elisha PonderGodfather is his main support person.  Skin assessment done with two staff members.  Patient has multiple tattoos and has rash on back abdomen, both arms and axilla.  He reports no pain at present time.  Patient oriented to unit and plan of care.  Patient and belongings checked for contraband with none found.  Belongings including clothing and cell phone locked up in locker #38 by Security.  Emotional support provided. Encouraged him to seek assistance with needs/concerns.  Safety maintained on unit.

## 2016-05-04 NOTE — ED Notes (Signed)
Patient sleeping breathing even and unlabored.  NAD at this time.

## 2016-05-05 MED ORDER — ZIPRASIDONE HCL 40 MG PO CAPS
40.0000 mg | ORAL_CAPSULE | Freq: Two times a day (BID) | ORAL | Status: DC
  Administered 2016-05-05 – 2016-05-06 (×2): 40 mg via ORAL
  Filled 2016-05-05 (×2): qty 1

## 2016-05-05 MED ORDER — NYSTATIN 100000 UNIT/GM EX POWD
Freq: Two times a day (BID) | CUTANEOUS | Status: DC
  Administered 2016-05-06: 08:00:00 via TOPICAL
  Filled 2016-05-05: qty 15

## 2016-05-05 MED ORDER — TRAZODONE HCL 50 MG PO TABS
50.0000 mg | ORAL_TABLET | Freq: Once | ORAL | Status: AC
  Administered 2016-05-05: 50 mg via ORAL
  Filled 2016-05-05: qty 1

## 2016-05-05 NOTE — Progress Notes (Signed)
[facility]-Observation Unit Progress Note  05/05/2016 3:03 PM Edwin Martinez  MRN:  [id number] Subjective:    Patient states "My sleep is better. Still very anxious. I would rate it at an eight. I still feel very depressed. I'm afraid I would hurt myself if I left. My mood is still unstable. I feel just a little better but not ready to go back home yet."  Objective:   Patient is seen and chart is reviewed. He appears depressed during the assessment today. Patient reports hearing voices off and on. Discussed possibility of patient leaving the unit after lunch but he became extremely anxious and voiced increased suicidal ideation. He expressed active plan to walk in front of a care to Observation Unit nursing staff. Patient then was noted to turn on his side and stop communicating with Clinical research associate. However, he was able to report some mild improvement in symptoms since the geodon was started yesterday. Reviewed current labs Vitamin B-12 and TSH within normal limits. Last Depakote level frim 6/102017 therapeutic at 59.   Principal Problem: Bipolar affective disorder, current episode depressed (HCC) Diagnosis:   Patient Active Problem List   Diagnosis Date Noted  . Cocaine abuse [F14.10] 05/04/2016  . Bipolar affective disorder, current episode depressed (HCC) [F31.30] 05/04/2016  . Bipolar 1 disorder, mixed, severe (HCC) [F31.63] 03/08/2016  . 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 Polysubstance abuse [F19.10]   . Marijuana abuse [F12.10]   . Bipolar affective disorder, depressed, moderate degree (HCC) [F31.32] 12/21/2015  . Bipolar affect, depressed (HCC) [F31.30] 12/21/2015  . Suicidal ideation [R45.851] 09/14/2014  . Depression [F32.9]   . Suicidal ideations [R45.851]   . Bipolar I disorder, most recent episode depressed (HCC) [F31.30] 05/24/2014  . GAD (generalized anxiety disorder) [F41.1] 05/24/2014  . MDD (major depressive disorder) (HCC) [F32.9] 05/23/2014  .  Suicide attempt (HCC) [T14.91] 05/23/2014  . MDD (major depressive disorder), recurrent episode, severe (HCC) [F33.2] 05/23/2014  . Transaminitis [R74.0] 03/20/2014  . ADHD (attention deficit hyperactivity disorder) [F90.9] 09/12/2012  . INSOMNIA, CHRONIC [G47.00] 05/20/2007  . CARPAL TUNNEL SYNDROME [G56.00] 05/20/2007  . 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 HIV DISEASE [B20] 05/05/2007  . 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 ADHD (attention deficit hyperactivity disorder)   . Anxiety   . Asthma   . Bipolar 1 disorder (HCC)   . Bipolar disorder (HCC)   . Epileptic seizures (HCC)   . HIV (human immunodeficiency virus infection) (HCC)   . Hypertension   . Schizophrenia (HCC)   . Seizures (HCC)     Past Surgical History:  Procedure Laterality Date  . DENTAL SURGERY     Family History:  Family History  Problem Relation Age of Onset  . Huntington's disease Father   . Heart disease Mother    Family Psychiatric  History: See H & P Social History:  History  Alcohol Use  . 1.2 oz/week  . 2 Standard drinks or equivalent per week    Comment: once week      History  Drug Use  . Frequency: 2.0 times per week  . Types: Marijuana    Comment: every other day     Social History   Social History  . Marital status: Single    Spouse name: N/A  .  Number of children: N/A  . Years of education: N/A   Social History Main Topics  . Smoking status: Current Every Day Smoker    Packs/day: 0.50    Types: Cigarettes    Start date: 09/29/1991  . Smokeless tobacco: Never Used  . Alcohol use 1.2 oz/week    2 Standard drinks or equivalent per week     Comment: once week   . Drug use:     Frequency: 2.0 times per week    Types: Marijuana     Comment: every other day   . Sexual activity:  Yes    Birth control/ protection: Condom   Other Topics Concern  . None   Social History Narrative   ** Merged History Encounter **       Additional Social History:    Pain Medications: see chart  Prescriptions: see chart Over the Counter: see chart History of alcohol / drug use?: Yes Longest period of sobriety (when/how long): 6 months Negative Consequences of Use: Financial, Personal relationships Withdrawal Symptoms: Irritability                    Sleep: Fair  Appetite:  Fair  Current Medications: Current Facility-Administered Medications  Medication Dose Route Frequency Provider Last Rate Last Dose  . abacavir-dolutegravir-lamiVUDine (TRIUMEQ) 600-50-300 MG per tablet 1 tablet  1 tablet Oral QHS Charm RingsJamison Y Lord, NP   1 tablet at 05/04/16 2125  . acetaminophen (TYLENOL) tablet 650 mg  650 mg Oral Q4H PRN Charm RingsJamison Y Lord, NP      . albuterol (PROVENTIL HFA;VENTOLIN HFA) 108 (90 Base) MCG/ACT inhaler 2 puff  2 puff Inhalation Q6H PRN Charm RingsJamison Y Lord, NP      . busPIRone (BUSPAR) tablet 10 mg  10 mg Oral BID Thermon LeylandLaura A Tristen Pennino, NP   10 mg at 05/05/16 0809  . clotrimazole (LOTRIMIN) 1 % cream   Topical BID Charm RingsJamison Y Lord, NP   1 application at 05/05/16 0809  . divalproex (DEPAKOTE ER) 24 hr tablet 500 mg  500 mg Oral BID Charm RingsJamison Y Lord, NP   500 mg at 05/05/16 0809  . gabapentin (NEURONTIN) capsule 300 mg  300 mg Oral TID Charm RingsJamison Y Lord, NP   300 mg at 05/05/16 1132  . nicotine (NICODERM CQ - dosed in mg/24 hours) patch 21 mg  21 mg Transdermal Daily Charm RingsJamison Y Lord, NP      . traZODone (DESYREL) tablet 150 mg  150 mg Oral QHS PRN Charm RingsJamison Y Lord, NP   150 mg at 05/04/16 2127  . ziprasidone (GEODON) capsule 40 mg  40 mg Oral BID WC Thermon LeylandLaura A Maile Linford, NP        Lab Results:  Results for orders placed or performed during the hospital encounter of 05/04/16 (from the past 48 hour(s))  TSH     Status: None   Collection Time: 05/04/16  6:40 PM  Result Value Ref Range   TSH 0.630  0.350 - 4.500 uIU/mL    Comment: Performed at [facility]  Vitamin B12     Status: None   Collection Time: 05/04/16  6:40 PM  Result Value Ref Range   Vitamin B-12 502 180 - 914 pg/mL    Comment: (NOTE) This assay is not validated for testing neonatal or myeloproliferative syndrome specimens for Vitamin B12 levels. [facility]formed [facility]     Blood Alcohol level:  Lab Results  Component Value Date   ETH 87 (H) 05/03/2016  ETH 51 (H) 03/07/2016    Metabolic Disorder Labs: No results found for: HGBA1C, MPG No results found for: PROLACTIN Lab Results  Component Value Date   CHOL 136 02/05/2015   TRIG 147 02/05/2015   HDL 54 02/05/2015   CHOLHDL 2.5 02/05/2015   VLDL 29 02/05/2015   LDLCALC 53 02/05/2015   LDLCALC 45 02/21/2014    Physical Findings: AIMS: Facial and Oral Movements Muscles of Facial Expression: None, normal Lips and Perioral Area: None, normal Jaw: None, normal Tongue: None, normal,Extremity Movements Upper (arms, wrists, hands, fingers): None, normal Lower (legs, knees, ankles, toes): None, normal, Trunk Movements Neck, shoulders, hips: None, normal, Overall Severity Severity of abnormal movements (highest score from questions above): None, normal Incapacitation due to abnormal movements: None, normal Patient's awareness of abnormal movements (rate only patient's report): No Awareness, Dental Status Current problems with teeth and/or dentures?: No Does patient usually wear dentures?: No  CIWA:  CIWA-Ar Total: 1 COWS:  COWS Total Score: 1  Musculoskeletal: Strength & Muscle Tone: within normal limits Gait & Station: normal Patient leans: N/A  Psychiatric Specialty Exam: Physical Exam  Review of Systems  Psychiatric/Behavioral: Positive for depression, hallucinations, substance abuse and suicidal ideas. The patient is nervous/anxious and has insomnia.     Blood pressure 113/72, pulse (!) 53, temperature 97.9 F (36.6  C), temperature source Oral, resp. rate 16, height 5\' 8"  (1.727 m), weight 69.9 kg (154 lb), SpO2 99 %.Body mass index is 23.42 kg/m.  General Appearance: Disheveled  Eye Contact:  Minimal  Speech:  Clear and Coherent and Slow  Volume:  Decreased  Mood:  Dysphoric  Affect:  Restricted  Thought Process:  Coherent  Orientation:  Full (Time, Place, and Person)  Thought Content:  Hallucinations: Auditory  Suicidal Thoughts:  Yes.  with intent/plan  Homicidal Thoughts:  No  Memory:  Immediate;   Good Recent;   Fair Remote;   Fair  Judgement:  Poor  Insight:  Shallow  Psychomotor Activity:  Decreased  Concentration:  Concentration: Fair and Attention Span: Fair  Recall:  [employer] of Knowledge:  Fair  Language:  Good  Akathisia:  No  Handed:  Right  AIMS (if indicated):     Assets:  Communication Skills Desire for Improvement Housing Leisure Time Physical Health Social Support  ADL's:  Intact  Cognition:  WNL  Sleep:        Treatment Plan Summary: Daily contact with patient to assess and evaluate symptoms and progress in treatment and Medication management  -Continue Neurontin 300 mg TID for anxiety -Continue Buspar 10 mg BID for anxiety -Increase Geodon to 40 mg bid for mood control/psychosis -Continue Trazodone 150 mg hs prn insomnia -Continue medication for HIV infection as ordered  -Anticipate discharge tomorrow after medication adjustment today and further Observation overnight. He reports already having established follow up at [facility]. Observation staff will ensure patient has current appointment prior to his discharge.   Fransisca Kaufmann, NP 05/05/2016, 3:03 PM

## 2016-05-05 NOTE — Progress Notes (Signed)
D:Patient awake and alert; affect flat; mood depressed/anxious; he reports passive suicidal ideation with no plan; verbally contracts for safety; he denies homicidal ideation; he says that he has been hearing voices but denies visual hallucinations; no self-injurious behaviors noted or reported. A:  Emotional support provided; encouraged him to seek assistance with needs/concerns; encouraged po fluid intake R:  Safety maintained on unit.

## 2016-05-05 NOTE — [facility] Counselor (Signed)
This Clinical research associatewriter spoke with Edwin KaufmannLaura Davis, NP to discuss disposition for this pt. It was determined that pt needs to be observed overnight for continuity of care. This information was communicated with pt.

## 2016-05-05 NOTE — [facility] Counselor (Signed)
This Clinical research associatewriter made collateral contact with a rep from [facility] to schedule a follow up appointment for this pt and was informed that due to pt's failure to finish a prior assessment that he would have to go through the triage process again via walk in clinic. This writer was informed that walk in clinic hours are M-F, between 8a-3p.

## 2016-05-06 DIAGNOSIS — F314 Bipolar disorder, current episode depressed, severe, without psychotic features: Secondary | ICD-10-CM | POA: Diagnosis not present

## 2016-05-06 LAB — HEMOGLOBIN A1C
Hgb A1c MFr Bld: 5.5 % (ref 4.8–5.6)
Mean Plasma Glucose: 111 mg/dL

## 2016-05-06 MED ORDER — GABAPENTIN 300 MG PO CAPS: 300.0000 mg | ORAL_CAPSULE | Freq: Three times a day (TID) | ORAL | 0 refills | Status: DC

## 2016-05-06 MED ORDER — DIVALPROEX SODIUM ER 500 MG PO TB24: 500.0000 mg | ORAL_TABLET | Freq: Two times a day (BID) | ORAL | 0 refills | Status: DC

## 2016-05-06 MED ORDER — FLUOXETINE HCL 10 MG PO CAPS: 10.0000 mg | ORAL_CAPSULE | Freq: Every day | ORAL | 0 refills | Status: DC

## 2016-05-06 MED ORDER — ZIPRASIDONE HCL 40 MG PO CAPS: 40.0000 mg | ORAL_CAPSULE | Freq: Two times a day (BID) | ORAL | 0 refills | Status: DC

## 2016-05-06 MED ORDER — NICOTINE 21 MG/24HR TD PT24: 21.0000 mg | MEDICATED_PATCH | Freq: Every day | TRANSDERMAL | 0 refills | Status: DC

## 2016-05-06 MED ORDER — TRAZODONE HCL 150 MG PO TABS: 150.0000 mg | ORAL_TABLET | Freq: Every evening | ORAL | 0 refills | Status: DC | PRN

## 2016-05-06 MED ORDER — BUSPIRONE HCL 10 MG PO TABS: 10.0000 mg | ORAL_TABLET | Freq: Two times a day (BID) | ORAL | 0 refills | Status: DC

## 2016-05-06 NOTE — Progress Notes (Signed)
Patient was met sleeping and continue to sleep at this moment. Writer woke patient up for bedtime meds and assessment. Patient complied with meds but can not keep awake for assessment. Witer maintained a constant observation for safety. Will continue to monitor patient.

## 2016-05-06 NOTE — Discharge Summary (Signed)
[facility] OBS UNIT DISCHARGE SUMMARY  05/06/2016 9:49 AM Edwin Martinez  MRN:  [id number]  Subjective:  "I feel a lot better today. I'm ready to go soon." Pt seen and chart reviewed. Pt is alert/oriented x4, calm, cooperative, and appropriate to situation. Pt denies suicidal/homicidal ideation and psychosis and does not appear to be responding to internal stimuli. Pt states he will followup as indicated below.   HPI: I have reviewed and concur with HPI elements below, modified as follows:  Edwin Martinez is a 30 year old male who presented to the [facility] to obtain help with worsening symptoms of depression, anxiety, and irritable mood. Patient reports that he is currently taking medications as prescribed but that they are not controlling his symptoms. He states that he uses Marijuana and drinks one to two 40 ounce beers once or twice weekly. He reports that he has been diagnosed as HIV+. Patient reports eight previous suicide attempts and inpatient hospitalizations.He was hospitalized in June of 2017 at [facility] after a serious overdose. Patient reports initial dx Bipolar disorder was in 2001 where is was placed inpatient at [facility]. Patient reports suicidal thoughts to "walk in front of a car." Patient currently denies HI and A/VH. Patient reports current substance use includes: Alcohol-once or twice per week, Cocaine-occasional use, marijuana on weekly basis. The patient is adamant that his substance use is not worsening his symptoms reporting they were no controlled after his last discharge from [facility]. There is no evidence of any withdrawal symptoms during today's assessment. Patient states "I take the medicines and nothing happens. Yes I have everyday stressors but I can't relax at all. It's getting worse. I was getting irritable with my partner and just easily agitated. I have an apartment I stay at and live alone. I have been to [facility] but said I needed to come back to the hospital." He reports taking  antidepressants in the past with worsening of symptoms such as Prozac.   Pt spent the night in [facility] OBS UNIT without incident. Pt reports that he would like to discharge with plans as below.    Principal Problem: Bipolar affective disorder, current episode depressed (HCC) Diagnosis:   Patient Active Problem List   Diagnosis Date Noted  . Bipolar affective disorder, current episode depressed (HCC) [F31.30] 05/04/2016    Priority: High  . Cocaine abuse [F14.10] 05/04/2016  . Bipolar 1 disorder, mixed, severe (HCC) [F31.63] 03/08/2016  . 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 Polysubstance abuse [F19.10]   . Marijuana abuse [F12.10]   . Bipolar affective disorder, depressed, moderate degree (HCC) [F31.32] 12/21/2015  . Bipolar affect, depressed (HCC) [F31.30] 12/21/2015  . Suicidal ideation [R45.851] 09/14/2014  . Depression [F32.9]   . Suicidal ideations [R45.851]   . Bipolar I disorder, most recent episode depressed (HCC) [F31.30] 05/24/2014  . GAD (generalized anxiety disorder) [F41.1] 05/24/2014  . MDD (major depressive disorder) (HCC) [F32.9] 05/23/2014  . Suicide attempt (HCC) [T14.91] 05/23/2014  . MDD (major depressive disorder), recurrent episode, severe (HCC) [F33.2] 05/23/2014  . Transaminitis [R74.0] 03/20/2014  . ADHD (attention deficit hyperactivity disorder) [F90.9] 09/12/2012  . INSOMNIA, CHRONIC [G47.00] 05/20/2007  . CARPAL TUNNEL SYNDROME [G56.00] 05/20/2007  . 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 HIV DISEASE [B20] 05/05/2007  . 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 ADHD (attention deficit hyperactivity disorder)   . Anxiety   . Asthma   . Bipolar 1 disorder (HCC)   . Bipolar disorder (HCC)   . Epileptic seizures (HCC)   . HIV (human  immunodeficiency virus infection) (HCC)   . Hypertension   . Schizophrenia (HCC)   . Seizures (HCC)     Past Surgical History:  Procedure Laterality Date  . DENTAL SURGERY     Family History:  Family History  Problem Relation Age of Onset  . Huntington's disease Father   . Heart disease Mother    Family Psychiatric  History: See H & P Social History:  History  Alcohol Use  . 1.2 oz/week  . 2 Standard drinks or equivalent per week    Comment: once week      History  Drug Use  . Frequency: 2.0 times per week  . Types: Marijuana    Comment: every other day     Social History   Social History  . Marital status: Single    Spouse name: N/A  . Number of children: N/A  . Years of education: N/A   Social History Main Topics  . Smoking status: Current Every Day Smoker    Packs/day: 0.50    Types: Cigarettes    Start date: 09/29/1991  . Smokeless tobacco: Never Used  . Alcohol use 1.2 oz/week    2 Standard drinks or equivalent per week     Comment: once week   . Drug use:     Frequency: 2.0 times per week    Types: Marijuana     Comment: every other day   . Sexual activity: Yes    Birth control/ protection: Condom   Other Topics Concern  . None   Social History Narrative   ** Merged History Encounter **       Additional Social History:    Pain Medications: see chart  Prescriptions: see chart Over the Counter: see chart History of alcohol / drug use?: Yes Longest period of sobriety (when/how long): 6 months Negative Consequences of Use: Financial, Personal relationships Withdrawal Symptoms: Irritability                    Sleep: Fair  Appetite:  Fair  Current Medications: Current Facility-Administered Medications  Medication Dose Route Frequency Provider Last Rate Last Dose  . abacavir-dolutegravir-lamiVUDine (TRIUMEQ) 600-50-300 MG per tablet 1 tablet  1 tablet Oral QHS Charm Rings, NP   1 tablet at 05/05/16 2123  . acetaminophen (TYLENOL)  tablet 650 mg  650 mg Oral Q4H PRN Charm Rings, NP      . albuterol (PROVENTIL HFA;VENTOLIN HFA) 108 (90 Base) MCG/ACT inhaler 2 puff  2 puff Inhalation Q6H PRN Charm Rings, NP      . busPIRone (BUSPAR) tablet 10 mg  10 mg Oral BID Thermon Leyland, NP   10 mg at 05/06/16 0748  . divalproex (DEPAKOTE ER) 24 hr tablet 500 mg  500 mg Oral BID Charm Rings, NP   500 mg at 05/06/16 0748  . gabapentin (NEURONTIN) capsule 300 mg  300 mg Oral TID Charm Rings, NP   300 mg at 05/06/16 0748  . nicotine (NICODERM CQ - dosed in mg/24 hours) patch 21 mg  21 mg Transdermal Daily Charm Rings, NP      . nystatin (MYCOSTATIN/NYSTOP) topical powder   Topical BID Thermon Leyland, NP      .  traZODone (DESYREL) tablet 150 mg  150 mg Oral QHS PRN Charm Rings, NP   150 mg at 05/04/16 2127  . ziprasidone (GEODON) capsule 40 mg  40 mg Oral BID WC Thermon Leyland, NP   40 mg at 05/06/16 1610    Lab Results:  Results for orders placed or performed during the hospital encounter of 05/04/16 (from the past 48 hour(s))  TSH     Status: None   Collection Time: 05/04/16  6:40 PM  Result Value Ref Range   TSH 0.630 0.350 - 4.500 uIU/mL    Comment: Performed at [facility]  Vitamin B12     Status: None   Collection Time: 05/04/16  6:40 PM  Result Value Ref Range   Vitamin B-12 502 180 - 914 pg/mL    Comment: (NOTE) This assay is not validated for testing neonatal or myeloproliferative syndrome specimens for Vitamin B12 levels. Performed at [facility]   Hemoglobin A1c     Status: None   Collection Time: 05/04/16  6:40 PM  Result Value Ref Range   Hgb A1c MFr Bld 5.5 4.8 - 5.6 %    Comment: (NOTE)         Pre-diabetes: 5.7 - 6.4         Diabetes: >6.4         Glycemic control for adults with diabetes: <7.0    Mean Plasma Glucose 111 mg/dL    Comment: (NOTE) Performed At: [facility] [address] [place], [place] [id number] Mila Homer MD  JX:[id number] Performed at [facility]     Blood Alcohol level:  Lab Results  Component Value Date   ETH 87 (H) 05/03/2016   ETH 51 (H) 03/07/2016    Metabolic Disorder Labs: Lab Results  Component Value Date   HGBA1C 5.5 05/04/2016   MPG 111 05/04/2016   No results found for: PROLACTIN Lab Results  Component Value Date   CHOL 136 02/05/2015   TRIG 147 02/05/2015   HDL 54 02/05/2015   CHOLHDL 2.5 02/05/2015   VLDL 29 02/05/2015   LDLCALC 53 02/05/2015   LDLCALC 45 02/21/2014    Physical Findings: AIMS: Facial and Oral Movements Muscles of Facial Expression: None, normal Lips and Perioral Area: None, normal Jaw: None, normal Tongue: None, normal,Extremity Movements Upper (arms, wrists, hands, fingers): None, normal Lower (legs, knees, ankles, toes): None, normal, Trunk Movements Neck, shoulders, hips: None, normal, Overall Severity Severity of abnormal movements (highest score from questions above): None, normal Incapacitation due to abnormal movements: None, normal Patient's awareness of abnormal movements (rate only patient's report): No Awareness, Dental Status Current problems with teeth and/or dentures?: No Does patient usually wear dentures?: No  CIWA:  CIWA-Ar Total: 1 COWS:  COWS Total Score: 1  Musculoskeletal: Strength & Muscle Tone: within normal limits Gait & Station: normal Patient leans: N/A  Psychiatric Specialty Exam: Physical Exam  Review of Systems  Psychiatric/Behavioral: Positive for depression and substance abuse. Negative for hallucinations and suicidal ideas. The patient is nervous/anxious and has insomnia.   All other systems reviewed and are negative.   Blood pressure 113/78, pulse 65, temperature 97.7 F (36.5 C), resp. rate 18, height 5\' 8"  (1.727 m), weight 69.9 kg (154 lb), SpO2 99 %.Body mass index is 23.42 kg/m.  General Appearance: Casual and Fairly Groomed  Eye Contact:  Minimal  Speech:  Clear and  Coherent and Slow  Volume:  Decreased  Mood:  Euthymic  Affect:  Restricted  Thought Process:  Coherent  Orientation:  Full (Time, Place, and Person)  Thought Content:  symptoms, worries, concerns  Suicidal Thoughts:  No  Homicidal Thoughts:  No  Memory:  Immediate;   Good Recent;   Fair Remote;   Fair  Judgement:  Poor  Insight:  Shallow  Psychomotor Activity:  Decreased  Concentration:  Concentration: Fair and Attention Span: Fair  Recall:  [employer]  Fund of Knowledge:  Fair  Language:  Good  Akathisia:  No  Handed:  Right  AIMS (if indicated):     Assets:  Communication Skills Desire for Improvement Housing Leisure Time Physical Health Social Support  ADL's:  Intact  Cognition:  WNL  Sleep:      Treatment Plan Summary: Bipolar affective disorder, current episode depressed (HCC) stable, improving, suited for outpatient management as below  Medications:  -Continue Neurontin 300 mg TID for anxiety -Continue Buspar 10 mg BID for anxiety -Increase Geodon to 40 mg bid for mood control/psychosis -Continue Trazodone 150 mg hs prn insomnia -Continue medication for HIV infection as ordered   Disposition:  -Discharge home -Followup with outpatient as [facility]  Elaya Droege, Everardo AllJohn C, FNP 05/06/2016, 9:49 AM

## 2016-05-06 NOTE — [facility] Counselor (Signed)
Pt will be discharged on this afternoon and is [facility] to [facility] [place] on 05/07/[place]. Pt will follow up with the walk-in clinic and he has been notified of the hours that he can complete the admission assessment. Pt is currently denying SI/HI.

## 2016-05-06 NOTE — Progress Notes (Signed)
Patient ID: Edwin Martinez, male   DOB: [birth year], 30 y.o.   MRN: [id number] Patient discharged per MD orders. Patient given education regarding follow-up appointments and medications. Patient denies any questions or concerns about these instructions. Patient was escorted to locker and given belongings before discharge to hospital lobby. Patient currently denies SI/HI and auditory and visual hallucinations on discharge.

## 2016-07-10 ENCOUNTER — Ambulatory Visit (INDEPENDENT_AMBULATORY_CARE_PROVIDER_SITE_OTHER): Payer: Self-pay | Admitting: Licensed Clinical Social Worker

## 2016-07-10 DIAGNOSIS — F439 Reaction to severe stress, unspecified: Secondary | ICD-10-CM
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
## 2016-07-16 ENCOUNTER — Other Ambulatory Visit: Payer: Self-pay | Admitting: Licensed Clinical Social Worker

## 2016-07-18 ENCOUNTER — Other Ambulatory Visit: Payer: Self-pay

## 2016-07-18 ENCOUNTER — Encounter (HOSPITAL_COMMUNITY): Payer: Self-pay

## 2016-07-18 ENCOUNTER — Inpatient Hospital Stay (HOSPITAL_COMMUNITY)
Admission: EM | Admit: 2016-07-18 | Discharge: 2016-07-24 | DRG: 918 | Disposition: A | Discharge status: Other Acute Inpatient Hospital | Payer: Self-pay | Attending: Internal Medicine | Admitting: Internal Medicine

## 2016-07-18 DIAGNOSIS — T39312A Poisoning by propionic acid derivatives, intentional self-harm, initial encounter: Secondary | ICD-10-CM | POA: Diagnosis present

## 2016-07-18 DIAGNOSIS — T43212A Poisoning by selective serotonin and norepinephrine reuptake inhibitors, intentional self-harm, initial encounter: Principal | ICD-10-CM | POA: Diagnosis present

## 2016-07-18 DIAGNOSIS — Z91010 Allergy to peanuts: Secondary | ICD-10-CM

## 2016-07-18 DIAGNOSIS — F319 Bipolar disorder, unspecified: Secondary | ICD-10-CM | POA: Diagnosis present

## 2016-07-18 DIAGNOSIS — T50902A Poisoning by unspecified drugs, medicaments and biological substances, intentional self-harm, initial encounter: Secondary | ICD-10-CM | POA: Diagnosis present

## 2016-07-18 DIAGNOSIS — Z21 Asymptomatic human immunodeficiency virus [HIV] infection status: Secondary | ICD-10-CM | POA: Diagnosis present

## 2016-07-18 DIAGNOSIS — F909 Attention-deficit hyperactivity disorder, unspecified type: Secondary | ICD-10-CM | POA: Diagnosis present

## 2016-07-18 DIAGNOSIS — Z9114 Patient's other noncompliance with medication regimen: Secondary | ICD-10-CM

## 2016-07-18 DIAGNOSIS — F1410 Cocaine abuse, uncomplicated: Secondary | ICD-10-CM

## 2016-07-18 DIAGNOSIS — I10 Essential (primary) hypertension: Secondary | ICD-10-CM | POA: Diagnosis present

## 2016-07-18 DIAGNOSIS — T426X2A Poisoning by other antiepileptic and sedative-hypnotic drugs, intentional self-harm, initial encounter: Secondary | ICD-10-CM | POA: Diagnosis present

## 2016-07-18 DIAGNOSIS — Z79899 Other long term (current) drug therapy: Secondary | ICD-10-CM

## 2016-07-18 DIAGNOSIS — Z888 Allergy status to other drugs, medicaments and biological substances status: Secondary | ICD-10-CM

## 2016-07-18 DIAGNOSIS — F209 Schizophrenia, unspecified: Secondary | ICD-10-CM | POA: Diagnosis present

## 2016-07-18 DIAGNOSIS — F17210 Nicotine dependence, cigarettes, uncomplicated: Secondary | ICD-10-CM | POA: Diagnosis present

## 2016-07-18 DIAGNOSIS — F419 Anxiety disorder, unspecified: Secondary | ICD-10-CM | POA: Diagnosis present

## 2016-07-18 DIAGNOSIS — Z8249 Family history of ischemic heart disease and other diseases of the circulatory system: Secondary | ICD-10-CM

## 2016-07-18 DIAGNOSIS — J45909 Unspecified asthma, uncomplicated: Secondary | ICD-10-CM | POA: Diagnosis present

## 2016-07-18 DIAGNOSIS — B20 Human immunodeficiency virus [HIV] disease: Secondary | ICD-10-CM | POA: Diagnosis present

## 2016-07-18 DIAGNOSIS — R001 Bradycardia, unspecified: Secondary | ICD-10-CM | POA: Diagnosis present

## 2016-07-18 DIAGNOSIS — Z882 Allergy status to sulfonamides status: Secondary | ICD-10-CM

## 2016-07-18 DIAGNOSIS — T43222A Poisoning by selective serotonin reuptake inhibitors, intentional self-harm, initial encounter: Secondary | ICD-10-CM | POA: Diagnosis present

## 2016-07-18 DIAGNOSIS — Z596 Low income: Secondary | ICD-10-CM

## 2016-07-18 DIAGNOSIS — Z881 Allergy status to other antibiotic agents status: Secondary | ICD-10-CM

## 2016-07-18 DIAGNOSIS — Z880 Allergy status to penicillin: Secondary | ICD-10-CM

## 2016-07-18 DIAGNOSIS — Z915 Personal history of self-harm: Secondary | ICD-10-CM

## 2016-07-18 DIAGNOSIS — G40909 Epilepsy, unspecified, not intractable, without status epilepticus: Secondary | ICD-10-CM | POA: Diagnosis present

## 2016-07-18 LAB — I-STAT CHEM 8, ED
BUN: 23 mg/dL — ABNORMAL HIGH (ref 6–20)
CHLORIDE: 105 mmol/L (ref 101–111)
Calcium, Ion: 1.14 mmol/L — ABNORMAL LOW (ref 1.15–1.40)
Creatinine, Ser: 0.9 mg/dL (ref 0.61–1.24)
Glucose, Bld: 97 mg/dL (ref 65–99)
HEMATOCRIT: 46 % (ref 39.0–52.0)
Hemoglobin: 15.6 g/dL (ref 13.0–17.0)
POTASSIUM: 3.9 mmol/L (ref 3.5–5.1)
SODIUM: 139 mmol/L (ref 135–145)
TCO2: 22 mmol/L (ref 0–100)

## 2016-07-18 LAB — COMPREHENSIVE METABOLIC PANEL
ALBUMIN: 4.2 g/dL (ref 3.5–5.0)
ALBUMIN: 3.4 g/dL — AB (ref 3.5–5.0)
ALK PHOS: 49 U/L (ref 38–126)
ALT: 24 U/L (ref 17–63)
ALT: 20 U/L (ref 17–63)
ANION GAP: 7 (ref 5–15)
AST: 35 U/L (ref 15–41)
AST: 31 U/L (ref 15–41)
Alkaline Phosphatase: 73 U/L (ref 38–126)
Anion gap: 6 (ref 5–15)
BILIRUBIN TOTAL: 0.3 mg/dL (ref 0.3–1.2)
BILIRUBIN TOTAL: 0.3 mg/dL (ref 0.3–1.2)
BUN: 19 mg/dL (ref 6–20)
BUN: 8 mg/dL (ref 6–20)
CALCIUM: 8.3 mg/dL — AB (ref 8.9–10.3)
CO2: 21 mmol/L — ABNORMAL LOW (ref 22–32)
CO2: 22 mmol/L (ref 22–32)
CREATININE: 0.9 mg/dL (ref 0.61–1.24)
Calcium: 9.1 mg/dL (ref 8.9–10.3)
Chloride: 106 mmol/L (ref 101–111)
Chloride: 110 mmol/L (ref 101–111)
Creatinine, Ser: 0.92 mg/dL (ref 0.61–1.24)
GFR calc Af Amer: >60 mL/min (ref >60)
GFR calc Af Amer: >60 mL/min (ref >60)
GFR calc non Af Amer: >60 mL/min (ref >60)
GLUCOSE: 99 mg/dL (ref 65–99)
GLUCOSE: 133 mg/dL — AB (ref 65–99)
POTASSIUM: 3.8 mmol/L (ref 3.5–5.1)
POTASSIUM: 4 mmol/L (ref 3.5–5.1)
Sodium: 134 mmol/L — ABNORMAL LOW (ref 135–145)
Sodium: 138 mmol/L (ref 135–145)
TOTAL PROTEIN: 6.8 g/dL (ref 6.5–8.1)
Total Protein: 5.6 g/dL — ABNORMAL LOW (ref 6.5–8.1)

## 2016-07-18 LAB — URINALYSIS, ROUTINE W REFLEX MICROSCOPIC
Bilirubin Urine: NEGATIVE
GLUCOSE, UA: NEGATIVE mg/dL
HGB URINE DIPSTICK: NEGATIVE
Ketones, ur: NEGATIVE mg/dL
Leukocytes, UA: NEGATIVE
Nitrite: NEGATIVE
PH: 6 (ref 5.0–8.0)
Protein, ur: NEGATIVE mg/dL
SPECIFIC GRAVITY, URINE: 1.025 (ref 1.005–1.030)

## 2016-07-18 LAB — I-STAT ARTERIAL BLOOD GAS, ED
ACID-BASE DEFICIT: 5 mmol/L — AB (ref 0.0–2.0)
Bicarbonate: 20.6 mmol/L (ref 20.0–28.0)
O2 SAT: 99 %
PH ART: 7.327 — AB (ref 7.350–7.450)
PO2 ART: 157 mmHg — AB (ref 83.0–108.0)
Patient temperature: 98.6
TCO2: 22 mmol/L (ref 0–100)
pCO2 arterial: 39.2 mmHg (ref 32.0–48.0)

## 2016-07-18 LAB — RAPID URINE DRUG SCREEN, HOSP PERFORMED
Amphetamines: NOT DETECTED
BARBITURATES: NOT DETECTED
BENZODIAZEPINES: NOT DETECTED
COCAINE: POSITIVE — AB
Opiates: NOT DETECTED
Tetrahydrocannabinol: NOT DETECTED

## 2016-07-18 LAB — CBC
HEMATOCRIT: 41.5 % (ref 39.0–52.0)
Hemoglobin: 14.1 g/dL (ref 13.0–17.0)
MCH: 29.8 pg (ref 26.0–34.0)
MCHC: 34 g/dL (ref 30.0–36.0)
MCV: 87.7 fL (ref 78.0–100.0)
Platelets: 250 10*3/uL (ref 150–400)
RBC: 4.73 MIL/uL (ref 4.22–5.81)
RDW: 13 % (ref 11.5–15.5)
WBC: 9.3 10*3/uL (ref 4.0–10.5)

## 2016-07-18 LAB — I-STAT CG4 LACTIC ACID, ED: Lactic Acid, Venous: 3.92 mmol/L (ref 0.5–1.9)

## 2016-07-18 LAB — ETHANOL

## 2016-07-18 LAB — AMMONIA
AMMONIA: 54 umol/L — AB (ref 9–35)
AMMONIA: 81 umol/L — AB (ref 9–35)
Ammonia: 455 umol/L — ABNORMAL HIGH (ref 9–35)

## 2016-07-18 LAB — SALICYLATE LEVEL: Salicylate Lvl: <7 mg/dL (ref 2.8–30.0)

## 2016-07-18 LAB — ACETAMINOPHEN LEVEL: Acetaminophen (Tylenol), Serum: <10 ug/mL — ABNORMAL LOW (ref 10–30)

## 2016-07-18 LAB — VALPROIC ACID LEVEL
VALPROIC ACID LVL: 144 ug/mL — AB (ref 50.0–100.0)
VALPROIC ACID LVL: 90 ug/mL (ref 50.0–100.0)
VALPROIC ACID LVL: 88 ug/mL (ref 50.0–100.0)

## 2016-07-18 LAB — MRSA PCR SCREENING: MRSA by PCR: NEGATIVE

## 2016-07-18 LAB — I-STAT TROPONIN, ED: Troponin i, poc: 0 ng/mL (ref 0.00–0.08)

## 2016-07-18 MED ORDER — SODIUM CHLORIDE 0.9 % IV BOLUS (SEPSIS)
1000.0000 mL | Freq: Once | INTRAVENOUS | Status: AC
  Administered 2016-07-18: 1000 mL via INTRAVENOUS

## 2016-07-18 MED ORDER — CHARCOAL ACTIVATED PO LIQD
50.0000 g | Freq: Once | ORAL | Status: AC
  Administered 2016-07-18: 50 g via ORAL
  Filled 2016-07-18: qty 240

## 2016-07-18 MED ORDER — LEVOCARNITINE 200 MG/ML IV SOLN
25.0000 mg/kg | Freq: Once | INTRAVENOUS | Status: AC
  Administered 2016-07-18: 1760 mg via INTRAVENOUS
  Filled 2016-07-18: qty 8.8

## 2016-07-18 MED ORDER — ACETAMINOPHEN 325 MG PO TABS
650.0000 mg | ORAL_TABLET | Freq: Four times a day (QID) | ORAL | Status: DC | PRN
Start: 2016-07-18 — End: 2016-07-24

## 2016-07-18 MED ORDER — LEVOCARNITINE 200 MG/ML IV SOLN
1500.0000 mg | Freq: Four times a day (QID) | INTRAVENOUS | Status: DC
  Administered 2016-07-19 (×3): 1500 mg via INTRAVENOUS
  Filled 2016-07-18 (×4): qty 7.5

## 2016-07-18 MED ORDER — ABACAVIR-DOLUTEGRAVIR-LAMIVUD 600-50-300 MG PO TABS
1.0000 | ORAL_TABLET | Freq: Every day | ORAL | Status: DC
  Administered 2016-07-18 – 2016-07-23 (×6): 1 via ORAL
  Filled 2016-07-18 (×7): qty 1

## 2016-07-18 MED ORDER — SODIUM CHLORIDE 0.9 % IV SOLN
INTRAVENOUS | Status: DC
  Administered 2016-07-18 – 2016-07-20 (×9): via INTRAVENOUS

## 2016-07-18 MED ORDER — SODIUM CHLORIDE 0.9% FLUSH
3.0000 mL | Freq: Two times a day (BID) | INTRAVENOUS | Status: DC
  Administered 2016-07-18 – 2016-07-22 (×7): 3 mL via INTRAVENOUS

## 2016-07-18 MED ORDER — ACETAMINOPHEN 650 MG RE SUPP: 650.0000 mg | Freq: Four times a day (QID) | RECTAL | Status: DC | PRN

## 2016-07-18 MED ORDER — ENOXAPARIN SODIUM 40 MG/0.4ML ~~LOC~~ SOLN
40.0000 mg | SUBCUTANEOUS | Status: DC
  Administered 2016-07-18 – 2016-07-23 (×5): 40 mg via SUBCUTANEOUS
  Filled 2016-07-18 (×6): qty 0.4

## 2016-07-18 MED ORDER — ALBUTEROL SULFATE (2.5 MG/3ML) 0.083% IN NEBU: 2.5000 mg | INHALATION_SOLUTION | RESPIRATORY_TRACT | Status: DC | PRN

## 2016-07-18 NOTE — ED Notes (Signed)
PA Will at the bedside   

## 2016-07-18 NOTE — ED Notes (Signed)
Patient wanded 

## 2016-07-18 NOTE — ED Triage Notes (Addendum)
Per [facility], Pt is coming from home with complaints of OD with Trazadone (15-20 Tabs), Neurotin (30 Tabs), Prazac (15 tabs), Depakote (50 Tabs), and smoked Cocaine within the last 24 hours. Pt denies Chest pain, but ST elevation noted on the EKG by EMS. Pt has Hx of SI and attempt in the past. Reports he has has a loss in support where people "just stopped talking" to him. Pt denies any change at home or work. Stated, "It happened in my mind." Denies hallucinations.

## 2016-07-18 NOTE — Progress Notes (Signed)
EKG CRITICAL VALUE     12 lead EKG performed.  Critical value noted.  Odette FractionEdelda, RN notified.   Barb MerinoMarilyn R Stout, CCT 07/18/2016 4:31 PM

## 2016-07-18 NOTE — ED Notes (Signed)
Admitting MD at the bedside.  

## 2016-07-18 NOTE — ED Provider Notes (Signed)
[facility]-EMERGENCY DEPT Provider Note   CSN: [id number] Arrival date & time: 07/18/16  1014     History   Chief Complaint Chief Complaint  Patient presents with  . Code STEMI  . Suicidal    HPI Edwin Martinez is a 30 y.o. male.  Edwin Martinez is a 30 y.o. Male with a history of HIV, depression, mood disorder, schizophrenia, bipolar disorder and previous suicide attempts who presents the emergency department after an overdose today. Patient reports about an hour ago he took 20 tablets of trazodone, 30 tablets of 300 mg of Neurontin, 20 tablets of Prozac 10 mg, and 50 tablets of Depakote 500 mg ER tablets. Patient also reports smoking "a lot" of cocaine.  He has a history of previous suicide attempts. He reports feeling depressed recently. He reports he's had a loss of support recently. He denies chest pain but does report some intermittent shortness of breath. EMS noted some ST elevations on his EKG in route. Patient denies fevers, recent illness, nausea, vomiting, abdominal pain, headaches, chest pain, HI, hallucinations or self harm.    The history is provided by the patient and medical records. No language interpreter was used.    Past Medical History:  Diagnosis Date  . ADHD (attention deficit hyperactivity disorder)   . Anxiety   . Asthma   . Bipolar 1 disorder (HCC)   . Bipolar disorder (HCC)   . Epileptic seizures (HCC)   . HIV (human immunodeficiency virus infection) (HCC)   . Hypertension   . Schizophrenia (HCC)   . Seizures [facility])     Patient Active Problem List   Diagnosis Date Noted  . Overdose 07/18/2016  . Stress 07/10/2016  . Cocaine abuse 05/04/2016  . Bipolar affective disorder, current episode depressed (HCC) 05/04/2016  . Bipolar 1 disorder, mixed, severe (HCC) 03/08/2016  . Cocaine abuse with cocaine-induced mood disorder (HCC)   . Polysubstance abuse   . Marijuana abuse   . Bipolar affective disorder, depressed, moderate degree (HCC) 12/21/2015    . Bipolar affect, depressed (HCC) 12/21/2015  . Suicidal ideation 09/14/2014  . Depression   . Suicidal ideations   . Bipolar I disorder, most recent episode depressed (HCC) 05/24/2014  . GAD (generalized anxiety disorder) 05/24/2014  . MDD (major depressive disorder) 05/23/2014  . Suicide attempt 05/23/2014  . MDD (major depressive disorder), recurrent episode, severe (HCC) 05/23/2014  . Transaminitis 03/20/2014  . ADHD (attention deficit hyperactivity disorder) 09/12/2012  . INSOMNIA, CHRONIC 05/20/2007  . CARPAL TUNNEL SYNDROME 05/20/2007  . 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 HIV DISEASE 05/05/2007  . BPLR I, MIXED, MOST RECENT EPSD, MODERATE 05/05/2007    Past Surgical History:  Procedure Laterality Date  . DENTAL SURGERY         Home Medications    Prior to Admission medications   Medication Sig Start Date End Date Taking? Authorizing Provider  abacavir-dolutegravir-lamiVUDine (TRIUMEQ) 600-50-300 MG tablet Take 1 tablet by mouth at bedtime.    Historical Provider, MD  albuterol (PROVENTIL HFA;VENTOLIN HFA) 108 (90 Base) MCG/ACT inhaler Inhale 2 puffs into the lungs every 6 (six) hours as needed for wheezing or shortness of breath.    Historical Provider, MD  busPIRone (BUSPAR) 10 MG tablet Take 1 tablet (10 mg total) by mouth 2 (two) times daily. Patient not taking: Reported on 07/18/2016 05/06/16   Beau Fanny, FNP  divalproex (DEPAKOTE ER) 500 MG 24 hr tablet Take 1 tablet (500 mg total) by mouth 2 (two) times  daily. 05/06/16   Beau Fanny, FNP  FLUoxetine (PROZAC) 10 MG capsule Take 1 capsule (10 mg total) by mouth at bedtime. 05/06/16   Beau Fanny, FNP  gabapentin (NEURONTIN) 300 MG capsule Take 1 capsule (300 mg total) by mouth 3 (three) times daily. 05/06/16   Beau Fanny, FNP  nicotine (NICODERM CQ - DOSED IN MG/24 HOURS) 21 mg/24hr patch Place 1 patch (21 mg total) onto the skin daily. 05/06/16   Beau Fanny, FNP  traZODone (DESYREL) 150 MG  tablet Take 1 tablet (150 mg total) by mouth at bedtime as needed for sleep. 05/06/16   Beau Fanny, FNP  ziprasidone (GEODON) 40 MG capsule Take 1 capsule (40 mg total) by mouth 2 (two) times daily with a meal. 05/06/16   Beau Fanny, FNP    Family History Family History  Problem Relation Age of Onset  . Huntington's disease Father   . Heart disease Mother     Social History Social History  Substance Use Topics  . Smoking status: Current Every Day Smoker    Packs/day: 0.50    Types: Cigarettes    Start date: 09/29/1991  . Smokeless tobacco: Never Used  . Alcohol use 1.2 oz/week    2 Standard drinks or equivalent per week     Comment: once week      Allergies   Magnesium-containing compounds; Peanut-containing drug products; Atripla [efavirenz-emtricitab-tenofovir]; Esomeprazole magnesium; Bactrim [sulfamethoxazole-trimethoprim]; and Penicillins   Review of Systems Review of Systems  Constitutional: Negative for chills and fever.  HENT: Negative for congestion and sore throat.   Eyes: Negative for visual disturbance.  Respiratory: Positive for shortness of breath. Negative for cough and wheezing.   Cardiovascular: Negative for chest pain and palpitations.  Gastrointestinal: Negative for abdominal pain, diarrhea, nausea and vomiting.  Genitourinary: Negative for dysuria.  Musculoskeletal: Negative for neck pain.  Skin: Negative for rash.  Neurological: Negative for headaches.  Psychiatric/Behavioral: Positive for dysphoric mood and suicidal ideas.     Physical Exam Updated Vital Signs BP 114/73   Pulse (!) 57   Temp 98.6 F (37 C) (Oral)   Resp 13   Ht 5\' 7"  (1.702 m)   Wt 70.3 kg   SpO2 100%   BMI 24.28 kg/m   Physical Exam  Constitutional: He is oriented to person, place, and time. He appears well-developed and well-nourished. No distress.  Nontoxic appearing.  HENT:  Head: Normocephalic and atraumatic.  Mouth/Throat: Oropharynx is clear and moist.    Eyes: Conjunctivae and EOM are normal. Pupils are equal, round, and reactive to light. Right eye exhibits no discharge. Left eye exhibits no discharge.  Neck: Neck supple.  Cardiovascular: Regular rhythm, normal heart sounds and intact distal pulses.  Exam reveals no gallop and no friction rub.   No murmur heard. Heart rate is 56.  Pulmonary/Chest: Effort normal and breath sounds normal. No respiratory distress. He has no wheezes. He has no rales.  Lungs clear auscultation bilaterally.  Abdominal: Soft. There is no tenderness. There is no guarding.  Abdomen is soft and nontender to palpation.  Musculoskeletal: He exhibits no edema.  Lymphadenopathy:    He has no cervical adenopathy.  Neurological: He is alert and oriented to person, place, and time. Coordination normal.  Patient is alert and oriented. Speech is clear and coherent. He seems slightly sleepy.  Skin: Skin is warm and dry. No rash noted. He is not diaphoretic. No erythema. No pallor.  Psychiatric: His speech is  not rapid and/or pressured and not slurred. He exhibits a depressed mood. He expresses suicidal ideation.  Patient appears depressed. He is somewhat sleepy. His speech is clear and coherent. He denies homicidal ideations. He does not appear to be responding to internal stimuli. He has good eye contact.  Nursing note and vitals reviewed.    ED Treatments / Results  Labs (all labs ordered are listed, but only abnormal results are displayed) Labs Reviewed  COMPREHENSIVE METABOLIC PANEL - Abnormal; Notable for the following:       Result Value   Sodium 134 (*)    CO2 21 (*)    All other components within normal limits  ACETAMINOPHEN LEVEL - Abnormal; Notable for the following:    Acetaminophen (Tylenol), Serum <10 (*)    All other components within normal limits  VALPROIC ACID LEVEL - Abnormal; Notable for the following:    Valproic Acid Lvl 144 (*)    All other components within normal limits  AMMONIA - Abnormal;  Notable for the following:    Ammonia 54 (*)    All other components within normal limits  I-STAT CHEM 8, ED - Abnormal; Notable for the following:    BUN 23 (*)    Calcium, Ion 1.14 (*)    All other components within normal limits  I-STAT CG4 LACTIC ACID, ED - Abnormal; Notable for the following:    Lactic Acid, Venous 3.92 (*)    All other components within normal limits  I-STAT ARTERIAL BLOOD GAS, ED - Abnormal; Notable for the following:    pH, Arterial 7.327 (*)    pO2, Arterial 157.0 (*)    Acid-base deficit 5.0 (*)    All other components within normal limits  CBC  ETHANOL  URINALYSIS, ROUTINE W REFLEX MICROSCOPIC (NOT [facility])  SALICYLATE LEVEL  RAPID URINE DRUG SCREEN, HOSP PERFORMED  BLOOD GAS, ARTERIAL  VALPROIC ACID LEVEL  AMMONIA  I-STAT TROPOININ, ED    EKG  EKG Interpretation  Date/Time:  Saturday July 18 2016 10:17:47 EDT Ventricular Rate:  53 PR Interval:    QRS Duration: 93 QT Interval:  427 QTC Calculation: 401 R Axis:   84 Text Interpretation:  Sinus rhythm Inferior infarct, acute (LCx) ST elevation, consider anterior injury Lateral leads are also involved ST elevation slightly more pronounced than previous however pt has previous benign early repol pattern Confirmed by LITTLE MD, RACHEL ([id number]) on 07/18/2016 10:34:55 AM Also confirmed by LITTLE MD, RACHEL [phone]), editor Whitney PostLOGAN, Cala BradfordKIMBERLY [phone]([id number])  on 07/18/2016 10:52:20 AM       Radiology No results found.  Procedures Procedures (including critical care time)  Medications Ordered in ED Medications  charcoal activated (NO SORBITOL) (ACTIDOSE-AQUA) suspension 50 g (50 g Oral Given 07/18/16 1045)  levOCARNitine (CARNITOR) 200 MG/ML injection 1,760 mg (1,760 mg Intravenous Given 07/18/16 1315)  sodium chloride 0.9 % bolus 1,000 mL (0 mLs Intravenous Stopped 07/18/16 1508)  sodium chloride 0.9 % bolus 1,000 mL (0 mLs Intravenous Stopped 07/18/16 1425)     Initial Impression / Assessment and  Plan / ED Course  I have reviewed the triage vital signs and the nursing notes.  Pertinent labs & imaging results that were available during my care of the patient were reviewed by me and considered in my medical decision making (see chart for details).  Clinical Course   This is a 30 y.o. Male with a history of HIV, depression, mood disorder, schizophrenia, bipolar disorder and previous suicide attempts who presents the emergency department  after an overdose today. Patient reports about an hour ago he took 20 tablets of trazodone, 30 tablets of 300 mg of Neurontin, 20 tablets of Prozac 10 mg, and 50 tablets of Depakote 500 mg ER tablets. Patient also reports smoking "a lot" of cocaine.  He has a history of previous suicide attempts.   On exam the patient is awake, alert and oriented. His speech is clear and coherent. Sedimentation rate is 56. EKG showed some diffuse ST elevation. I STEMI activation was placed by a colleague.  I consulted with cardiologist Dr. Excell Seltzer who also evaluated the EKG. He does not feel this meets STEMI criteria because the patient has been using cocaine with his history of overdose. He also sees history early report on his previous EKGs.  Code STEMI was canceled. No QT prolongation on EKG.  11:40 am: I spoke with Counselling psychologist from poison control who Recommends supportive care. She also recommends drawing a Depakote and ammonia level. Commence providing the patient activated charcoal. She would like [place] to do 4 hour Tylenol, and Depakote levels. She will check back and shortly for follow-up.  At recheck patient is alert and oriented and tolerated the activated charcoal well. He is watching TV.   Patient's troponin is not elevated. Salicylate and acetaminophen levels are undetectable. Alcohol level was undetectable. CMP is unremarkable. No elevated liver enzymes. CBC is within normal limits. Valproic acid level is 144. Ammonia level is 54. These are the initial labs drawn at 11.  Will recheck labs in 4 hours.  I spoke with poison control again and reported the lab findings. They recommended continued supportive care and 25mg /kg of Levocarnitine if more sleepy or comatose. Max is 2 g per day.   At reevaluation the patient is sleeping in the room. He will wake up with painful stimuli. He is breathing spontaneously. Will provide with Levocarnitine and consult with critical care medicine.   I consulted with Dr. Marchelle Gearing who would like me to check a lactic acid and ABG and call him back with results as this will help determine if he needs to go to ICU vs step down.   Lactic acid returned at 3.92. ABG shows a pH of 7.327 with a PCO2 of 39.2.  The patient is noted to have a lactate of 3.92. With the current information available to me, I don't think the patient is in septic shock. The lactate 3.92, is related to drug / toxin overdose.  I consulted with Dr. Marchelle Gearing from [facility] again.  He feels the patient can go to step down with medicine admission. He will place the patient on the rounding list for [facility] and advises to consult if needed or if the patient worsens.   Patient still sleeping in room and is arousable with painful stimuli. He is using the urinal.  I consulted with internal medicine teaching service who accepted the patient for admission. Attending is Dr. Lavon Paganini. Temp orders for step down placed. Patient is voluntary.   This patient was discussed with and evaluated by Dr. Clarene Duke who agrees with assessment and plan.    CRITICAL CARE Performed by: Lawana Chambers   Total critical care time: 50 minutes  Critical care time was exclusive of separately billable procedures and treating other patients.  Critical care was necessary to treat or prevent imminent or life-threatening deterioration.  Critical care was time spent personally by me on the following activities: development of treatment plan with patient and/or surrogate as well as nursing, discussions  with  consultants, evaluation of patient's response to treatment, examination of patient, obtaining history from patient or surrogate, ordering and performing treatments and interventions, ordering and review of laboratory studies, ordering and review of radiographic studies, pulse oximetry and re-evaluation of patient's condition.   Final Clinical Impressions(s) / ED Diagnoses   Final diagnoses:  Intentional drug overdose, initial encounter [facility])  Suicide attempt by drug ingestion, initial encounter [facility])    New Prescriptions New Prescriptions   No medications on file     Everlene Farrier, PA-C 07/18/16 1538    Laurence Spates, MD 07/19/16 6045    Laurence Spates, MD 07/19/16 [phone]

## 2016-07-18 NOTE — ED Notes (Signed)
Attempted Report x1.   

## 2016-07-18 NOTE — ED Notes (Signed)
Got the patient into a gown and on the monitor did ekg shown to pod A doctor

## 2016-07-18 NOTE — H&P (Signed)
Date: 07/18/2016               Patient Name:  Edwin Martinez MRN: [id number]  DOB: [birth year] Age / Sex: 30 y.o., male   PCP: No Pcp Per Patient         Medical Service: Internal Medicine Teaching Service         Attending Physician: Dr. Earl Lagos, MD    First Contact: Dr. Thomasene Lot  Pager: [phone]   Second Contact: Dr. Valentino Nose  Pager: [phone]       After Hours (After 5p/  First Contact Pager: [phone]  weekends / holidays): Second Contact Pager: [phone]   Chief Complaint: Suicide attempt  History of Present Illness: Edwin Martinez is a 30 year old African-American male with a medical history of HIV with a most recent CD4 count of 1250 on 09/11/2015, depression, mood disorder, schizophrenia, bipolar disorder and multiple previous suicide attempts who presented to the emergency department after an overdose. The patient reports taking approximately 50 Depakote, 15-20 Neurontin, 15-20 trazodone and approximately 30 fluoxetine pills this morning. The patient stated that he no longer wants to live. He says that he feels as if his "soul is tired". He has also says that his medications are not working for him and he just wants to find a medicine that will help him. He has had lots of stressors recently and struggles financially to pay bills and with transportation to doctor's appointments. Additionally, the patient stated that his dad had Huntington's disease and he witnessed his dad go through the progression of this disease. He does endorse previous suicide attempts in the past including an attempted hanging of himself. Additionally, he has tried to cut himself and has had multiple overdoses. And all the patient admits to 4 previous suicide attempts.  In the emergency department the patient was afebrile and hemodynamically stable. UDS was positive for cocaine. Ethanol level was negative. Tylenol level was negative. Salicylate level was negative. Valproic acid level was  elevated to 144. Ammonia was elevated at 54. CBC was unremarkable. BMP was unremarkable. EKG showed several ST segment elevations and cardiology was consult who felt this was either secondary to cocaine or overdose use. Code STEMI was canceled. Poison control was consulted who made recommendations ( please see assessment and plan for details). The patient was then admitted to the [facility] internal medicine teaching service for further management of drug overdose and suicidal ideation.  Meds:  No outpatient prescriptions have been marked as taking for the 07/18/16 encounter [facility] Encounter).     Allergies: Allergies as of 07/18/2016 - Review Complete 07/18/2016  Allergen Reaction Noted  . Magnesium-containing compounds Other (See Comments) 05/25/2014  . Peanut-containing drug products Anaphylaxis 02/13/2016  . Atripla [efavirenz-emtricitab-tenofovir] Other (See Comments) 02/13/2016  . Esomeprazole magnesium Cough 05/05/2007  . Bactrim [sulfamethoxazole-trimethoprim] Rash 02/13/2016  . Penicillins Rash and Other (See Comments) 02/13/2016   Past Medical History:  Diagnosis Date  . ADHD (attention deficit hyperactivity disorder)   . Anxiety   . Asthma   . Bipolar 1 disorder (HCC)   . Bipolar disorder (HCC)   . Epileptic seizures (HCC)   . HIV (human immunodeficiency virus infection) (HCC)   . Hypertension   . Schizophrenia (HCC)   . Seizures (HCC)     Family History:  1. Father died of Huntington's disease, also had a myocardial infarction 2. Mother died of myocardial infarction  Social History:  Uses cocaine, UDS positive for cocaine  Review of  Systems: A complete ROS was negative except as per HPI.   Physical Exam: Blood pressure 114/73, pulse (!) 57, temperature 98.6 F (37 C), temperature source Oral, resp. rate 13, height 5\' 7"  (1.702 m), weight 155 lb (70.3 kg), SpO2 100 %. Physical Exam  Constitutional: He is oriented to person, place, and time. He appears  well-developed and well-nourished.  Drowsy on examination.  HENT:  Head: Normocephalic and atraumatic.  Extremely poor dentition, lingual tongue with black appearance  Cardiovascular: Normal rate and regular rhythm.  Exam reveals no gallop and no friction rub.   No murmur heard. Respiratory: Effort normal. He has wheezes.  Patient with bilateral wheezes  GI: Soft. Bowel sounds are normal. He exhibits no distension. There is no tenderness.  Musculoskeletal: He exhibits no edema.  Neurological: He is alert and oriented to person, place, and time.  Skin:  Multiple tattoos present on his upper extremity is bilaterally in lower extremities  Psychiatric:  Very depressed, tearful throughout examination     EKG: ST segment elevations, code STEMI not called as these were thought to be secondary to cocaine abuse or drug overdose  CXR: Not obtained  Assessment & Plan by Problem: 1 suicide attempt with pharmacological overdose The patient admits to a long history of depression and multiple previous suicide attempts and reports that he was trying to take his own life today. He said he took a total of approximately 100 pills including Depakote, Neurontin, trazodone and fluoxetine. He also endorsed cocaine use. UDS was positive for cocaine. Depakote and ammonia levels were both elevated. EKG at presentation with ST segment elevations most likely secondary to cocaine abuse or overdose. Cardiology was consulted and canceled code STEMI. The patient will be admitted for monitoring of medication toxicity in the setting of attempted overdose. The patient states that his father died from Huntington's disease. This is an autosomal dominant condition associated with a trinucleotide expansion and genetic anticipation. Psychiatric manifestations have been documented as the presenting features of this progressively fatal illness. If the patient has not been tested I would recommend this upon discharge. Additionally,  as the patient remains actively suicidal following medical stabilization I anticipate he will need admission to behavioral health unit. -- Poison control recommends valproic acid and ammonia levels every 6 hours until a peak is reached. Valproic acid has a late peak so multiple lab draws may be required in trending this lab. If levels continue to rise we recommend repeating dose of activated charcoal. -- Trend LFTs and ammonia levels. If LFTs and/or ammonia level rises would re-dose L carnitine -- Aggressive hydration, given 2 L of normal saline in the emergency department, 150 ML's normal saline per hour -- Repeat EKG in 6 hours per recommendation of poison control with special attention paid to prolongation of the QTc interval. If the patient develops QTc prolongation we recommend aggressive electrolyte management and limitation of medications associated with QTc prolongation. -- Suicide precautions -- Psychiatry consult  2. HIV infection The patient has a HIV infection follows with Dr. Haynes [facility] of infectious diseases. His most recent CD4 count was 1250 on 09/11/2015. -- Continue Triumeq  3. Asthma Patient has a history of asthma. He says he has been out of his medications. He was diffusely wheezy on examination. -- Albuterol nebulizer every 4 hours as needed  DVT/PE prophylaxis: Lovenox FEN/GI: Normal diet  Dispo: Admit patient to Inpatient with expected length of stay greater than 2 midnights.  Signed: Thomasene LotJames Nekhi Liwanag, MD 07/18/2016, 3:57 PM  Pager:  [phone]  

## 2016-07-18 NOTE — ED Notes (Signed)
MD at the bedside  

## 2016-07-18 NOTE — ED Notes (Signed)
PA to call poison control

## 2016-07-18 NOTE — ED Notes (Signed)
Attempted to get Urine. Unable to do so

## 2016-07-19 DIAGNOSIS — F1290 Cannabis use, unspecified, uncomplicated: Secondary | ICD-10-CM

## 2016-07-19 DIAGNOSIS — Z8489 Family history of other specified conditions: Secondary | ICD-10-CM

## 2016-07-19 DIAGNOSIS — F1490 Cocaine use, unspecified, uncomplicated: Secondary | ICD-10-CM

## 2016-07-19 DIAGNOSIS — F17210 Nicotine dependence, cigarettes, uncomplicated: Secondary | ICD-10-CM | POA: Diagnosis not present

## 2016-07-19 DIAGNOSIS — F314 Bipolar disorder, current episode depressed, severe, without psychotic features: Secondary | ICD-10-CM | POA: Diagnosis not present

## 2016-07-19 DIAGNOSIS — F1099 Alcohol use, unspecified with unspecified alcohol-induced disorder: Secondary | ICD-10-CM

## 2016-07-19 DIAGNOSIS — Z8249 Family history of ischemic heart disease and other diseases of the circulatory system: Secondary | ICD-10-CM

## 2016-07-19 DIAGNOSIS — Z79899 Other long term (current) drug therapy: Secondary | ICD-10-CM

## 2016-07-19 DIAGNOSIS — B20 Human immunodeficiency virus [HIV] disease: Secondary | ICD-10-CM

## 2016-07-19 LAB — COMPREHENSIVE METABOLIC PANEL
ALBUMIN: 3.1 g/dL — AB (ref 3.5–5.0)
ALT: 19 U/L (ref 17–63)
ALT: 19 U/L (ref 17–63)
ANION GAP: 5 (ref 5–15)
ANION GAP: 3 — AB (ref 5–15)
AST: 25 U/L (ref 15–41)
AST: 21 U/L (ref 15–41)
Albumin: 3.2 g/dL — ABNORMAL LOW (ref 3.5–5.0)
Alkaline Phosphatase: 50 U/L (ref 38–126)
Alkaline Phosphatase: 42 U/L (ref 38–126)
BUN: 9 mg/dL (ref 6–20)
BUN: 6 mg/dL (ref 6–20)
CHLORIDE: 110 mmol/L (ref 101–111)
CO2: 24 mmol/L (ref 22–32)
CO2: 24 mmol/L (ref 22–32)
Calcium: 8.3 mg/dL — ABNORMAL LOW (ref 8.9–10.3)
Calcium: 8.3 mg/dL — ABNORMAL LOW (ref 8.9–10.3)
Chloride: 114 mmol/L — ABNORMAL HIGH (ref 101–111)
Creatinine, Ser: 0.83 mg/dL (ref 0.61–1.24)
Creatinine, Ser: 0.81 mg/dL (ref 0.61–1.24)
GFR calc non Af Amer: >60 mL/min (ref >60)
GLUCOSE: 89 mg/dL (ref 65–99)
Glucose, Bld: 78 mg/dL (ref 65–99)
POTASSIUM: 3.7 mmol/L (ref 3.5–5.1)
POTASSIUM: 4.1 mmol/L (ref 3.5–5.1)
SODIUM: 141 mmol/L (ref 135–145)
Sodium: 139 mmol/L (ref 135–145)
TOTAL PROTEIN: 5.4 g/dL — AB (ref 6.5–8.1)
Total Bilirubin: 0.3 mg/dL (ref 0.3–1.2)
Total Bilirubin: 0.5 mg/dL (ref 0.3–1.2)
Total Protein: 5.5 g/dL — ABNORMAL LOW (ref 6.5–8.1)

## 2016-07-19 LAB — HIV-1 RNA QUANT-NO REFLEX-BLD
HIV 1 RNA Quant: 100 copies/mL
LOG10 HIV-1 RNA: 2 {Log_copies}/mL

## 2016-07-19 LAB — AMMONIA
AMMONIA: 76 umol/L — AB (ref 9–35)
AMMONIA: 200 umol/L — AB (ref 9–35)
AMMONIA: 50 umol/L — AB (ref 9–35)
AMMONIA: 58 umol/L — AB (ref 9–35)

## 2016-07-19 LAB — VALPROIC ACID LEVEL
VALPROIC ACID LVL: 117 ug/mL — AB (ref 50.0–100.0)
VALPROIC ACID LVL: 108 ug/mL — AB (ref 50.0–100.0)
VALPROIC ACID LVL: 85 ug/mL (ref 50.0–100.0)
Valproic Acid Lvl: 142 ug/mL — ABNORMAL HIGH (ref 50.0–100.0)

## 2016-07-19 LAB — SALICYLATE LEVEL

## 2016-07-19 NOTE — Consult Note (Signed)
[place] Psychiatry Consult   Reason for Consult: Intentional suicide attempt Referring Physician:  Dr. Lovena Le Patient Identification: Edwin Martinez MRN:  [id number] Principal Diagnosis: Intentional drug overdose [facility]) Diagnosis:   Patient Active Problem List   Diagnosis Date Noted  . Bipolar I disorder, most recent episode depressed ([place]) [F31.30] 05/24/2014    Priority: High  . GAD (generalized anxiety disorder) [F41.1] 05/24/2014    Priority: High  . Intentional drug overdose ([place]) [T50.902A] 07/18/2016  . Stress [F43.9] 07/10/2016  . Cocaine abuse [F14.10] 05/04/2016  . Bipolar affective disorder, current episode depressed ([place]) [F31.30] 05/04/2016  . Bipolar 1 disorder, mixed, severe ([place]) [F31.63] 03/08/2016  . Cocaine abuse with cocaine-induced mood disorder ([place]) [F14.14]   . Polysubstance abuse [F19.10]   . Marijuana abuse [F12.10]   . Bipolar affective disorder, depressed, moderate degree ([place]) [F31.32] 12/21/2015  . Bipolar affect, depressed ([place]) [F31.30] 12/21/2015  . Suicidal ideation [R45.851] 09/14/2014  . Depression [F32.9]   . Suicidal ideations [R45.851]   . MDD (major depressive disorder) [F32.9] 05/23/2014  . Suicide attempt [T14.91XA] 05/23/2014  . MDD (major depressive disorder), recurrent episode, severe ([place]) [F33.2] 05/23/2014  . Transaminitis [R74.0] 03/20/2014  . ADHD (attention deficit hyperactivity disorder) [F90.9] 09/12/2012  . INSOMNIA, CHRONIC [G47.00] 05/20/2007  . CARPAL TUNNEL SYNDROME [G56.00] 05/20/2007  . Asthma [J45.909] 05/20/2007  . Human immunodeficiency virus (HIV) disease ([place]) [B20] 05/05/2007  . 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 male patient admitted with suicide attempt.  HPI: Thanks for asking me to do Edwin psychiatric consultation on Edwin Martinez, Edwin Martinez with long history of Bipolar disorder,  depression dating back to age 75. Patient reports at least over 4 suicide attempts in the past. He states that he attempted suicide yesterday by overdosing on 50 tablets of Depakote, 30 tablets of Neurontin, 20 trazodone, 15-20 Prozac and 15-20 Naproxen. Patient reports non-compliant with his medications because he does not believe his current medication regimen is effective. Also, he endorses worsening depressive symptoms, hopelessness, lack of motivation, low energy level and tired of living. He says that he feels lonely, withdrawn and says "my soul is tired". Reports his only brother is incarcerated, Mother died of massive Heart attack at age 58, father died of complications of Huntington's disease at age 18 and he is battling with multiple medical issues and financial problem. Patient denies delusions, psychosis but unable to contract for safety. He denies drugs and alcohol abuse but his urine toxicology is positive for cocaine. He does not want to take his current medications anymore and requesting for Edwin change.   Past Psychiatric History: as above  Risk to Self: Is patient at risk for suicide?: Yes Risk to Others:   Prior Inpatient Therapy:   Prior Outpatient Therapy:    Past Medical History:  Past Medical History:  Diagnosis Date  . ADHD (attention deficit hyperactivity disorder)   . Anxiety   . Asthma   . Bipolar 1 disorder ([place])   . Bipolar disorder ([place])   . Epileptic seizures ([place])   . HIV (human immunodeficiency virus infection) ([place])   . Hypertension   . Schizophrenia ([place])   . Seizures ([place])     Past Surgical History:  Procedure Laterality Date  . DENTAL SURGERY     Family History:  Family History  Problem Relation Age of Onset  .  Huntington's disease Father   . Heart disease Mother    Family Psychiatric  History:  Social History:  History  Alcohol Use  . 1.2 oz/week  . 2 Standard drinks or equivalent per week    Comment: once week      History  Drug Use  .  Frequency: 2.0 times per week  . Types: Marijuana, Cocaine    Comment: every other day     Social History   Social History  . Marital status: Single    Spouse name: N/Edwin  . Number of children: N/Edwin  . Years of education: N/Edwin   Social History Main Topics  . Smoking status: Current Every Day Smoker    Packs/day: 0.50    Types: Cigarettes    Start date: 09/29/1991  . Smokeless tobacco: Never Used  . Alcohol use 1.2 oz/week    2 Standard drinks or equivalent per week     Comment: once week   . Drug use:     Frequency: 2.0 times per week    Types: Marijuana, Cocaine     Comment: every other day   . Sexual activity: Yes    Birth control/ protection: Condom   Other Topics Concern  . None   Social History Narrative   ** Merged History Encounter **       Additional Social History:    Allergies:   Allergies  Allergen Reactions  . Magnesium-Containing Compounds Other (See Comments)    This medication is contraindicated with pts HIV meds.    . Peanut-Containing Drug Products Anaphylaxis  . Atripla [Efavirenz-Emtricitab-Tenofovir] Other (See Comments)    Reaction:  Suicidal thoughts   . Esomeprazole Magnesium Cough  . Bactrim [Sulfamethoxazole-Trimethoprim] Rash  . Penicillins Rash and Other (See Comments)    Has patient had Edwin PCN reaction causing immediate rash, facial/tongue/throat swelling, SOB or lightheadedness with hypotension: Yes Has patient had Edwin PCN reaction causing severe rash involving mucus membranes or skin necrosis: No Has patient had Edwin PCN reaction that required hospitalization No Has patient had Edwin PCN reaction occurring within the last 10 years: No If all of the above answers are "NO", then may proceed with Cephalosporin use.    Labs:  Results for orders placed or performed during the hospital encounter of 07/18/16 (from the past 48 hour(s))  I-stat troponin, ED     Status: None   Collection Time: 07/18/16 10:35 AM  Result Value Ref Range   Troponin i, poc  0.00 0.00 - 0.08 ng/mL   Comment 3            Comment: Due to the release kinetics of cTnI, Edwin negative result within the first hours of the onset of symptoms does not rule out myocardial infarction with certainty. If myocardial infarction is still suspected, repeat the test at appropriate intervals.   I-stat chem 8, ed     Status: Abnormal   Collection Time: 07/18/16 10:37 AM  Result Value Ref Range   Sodium 139 135 - 145 mmol/L   Potassium 3.9 3.5 - 5.1 mmol/L   Chloride 105 101 - 111 mmol/L   BUN 23 (H) 6 - 20 mg/dL   Creatinine, Ser 0.90 0.61 - 1.24 mg/dL   Glucose, Bld 97 65 - 99 mg/dL   Calcium, Ion 1.14 (L) 1.15 - 1.40 mmol/L   TCO2 22 0 - 100 mmol/L   Hemoglobin 15.6 13.0 - 17.0 g/dL   HCT 46.0 39.0 - 52.0 %  Ethanol  Status: None   Collection Time: 07/18/16 11:00 AM  Result Value Ref Range   Alcohol, Ethyl (B) <5 <5 mg/dL    Comment:        LOWEST DETECTABLE LIMIT FOR SERUM ALCOHOL IS 5 mg/dL FOR MEDICAL PURPOSES ONLY   Acetaminophen level     Status: Abnormal   Collection Time: 07/18/16 11:00 AM  Result Value Ref Range   Acetaminophen (Tylenol), Serum <10 (L) 10 - 30 ug/mL    Comment:        THERAPEUTIC CONCENTRATIONS VARY SIGNIFICANTLY. Edwin RANGE OF 10-30 ug/mL MAY BE AN EFFECTIVE CONCENTRATION FOR MANY PATIENTS. HOWEVER, SOME ARE BEST TREATED AT CONCENTRATIONS OUTSIDE THIS RANGE. ACETAMINOPHEN CONCENTRATIONS >150 ug/mL AT 4 HOURS AFTER INGESTION AND >50 ug/mL AT 12 HOURS AFTER INGESTION ARE OFTEN ASSOCIATED WITH TOXIC REACTIONS.   Salicylate level     Status: None   Collection Time: 07/18/16 11:00 AM  Result Value Ref Range   Salicylate Lvl <4.2 2.8 - 30.0 mg/dL  Valproic acid level     Status: Abnormal   Collection Time: 07/18/16 11:00 AM  Result Value Ref Range   Valproic Acid Lvl 144 (H) 50.0 - 100.0 ug/mL  Ammonia     Status: Abnormal   Collection Time: 07/18/16 11:01 AM  Result Value Ref Range   Ammonia 54 (H) 9 - 35 umol/L  CBC      Status: None   Collection Time: 07/18/16 11:04 AM  Result Value Ref Range   WBC 9.3 4.0 - 10.5 K/uL   RBC 4.73 4.22 - 5.81 MIL/uL   Hemoglobin 14.1 13.0 - 17.0 g/dL   HCT 41.5 39.0 - 52.0 %   MCV 87.7 78.0 - 100.0 fL   MCH 29.8 26.0 - 34.0 pg   MCHC 34.0 30.0 - 36.0 g/dL   RDW 13.0 11.5 - 15.5 %   Platelets 250 150 - 400 K/uL  Comprehensive metabolic panel     Status: Abnormal   Collection Time: 07/18/16 11:04 AM  Result Value Ref Range   Sodium 134 (L) 135 - 145 mmol/L   Potassium 3.8 3.5 - 5.1 mmol/L   Chloride 106 101 - 111 mmol/L   CO2 21 (L) 22 - 32 mmol/L   Glucose, Bld 99 65 - 99 mg/dL   BUN 19 6 - 20 mg/dL   Creatinine, Ser 0.92 0.61 - 1.24 mg/dL   Calcium 9.1 8.9 - 10.3 mg/dL   Total Protein 6.8 6.5 - 8.1 g/dL   Albumin 4.2 3.5 - 5.0 g/dL   AST 35 15 - 41 U/L   ALT 24 17 - 63 U/L   Alkaline Phosphatase 73 38 - 126 U/L   Total Bilirubin 0.3 0.3 - 1.2 mg/dL   GFR calc non Af Amer >60 >60 mL/min   GFR calc Af Amer >60 >60 mL/min    Comment: (NOTE) The eGFR has been calculated using the CKD EPI equation. This calculation has not been validated in all clinical situations. eGFR's persistently <60 mL/min signify possible Chronic Kidney Disease.    Anion gap 7 5 - 15  I-Stat arterial blood gas, ED     Status: Abnormal   Collection Time: 07/18/16  1:20 PM  Result Value Ref Range   pH, Arterial 7.327 (L) 7.350 - 7.450   pCO2 arterial 39.2 32.0 - 48.0 mmHg   pO2, Arterial 157.0 (H) 83.0 - 108.0 mmHg   Bicarbonate 20.6 20.0 - 28.0 mmol/L   TCO2 22 0 - 100 mmol/L  O2 Saturation 99.0 %   Acid-base deficit 5.0 (H) 0.0 - 2.0 mmol/L   Patient temperature 98.6 F    Collection site RADIAL, ALLEN'S TEST ACCEPTABLE    Drawn by Operator    Sample type ARTERIAL   I-Stat CG4 Lactic Acid, ED     Status: Abnormal   Collection Time: 07/18/16  1:28 PM  Result Value Ref Range   Lactic Acid, Venous 3.92 (HH) 0.5 - 1.9 mmol/L   Comment NOTIFIED PHYSICIAN   Rapid urine drug screen  (hospital performed)     Status: Abnormal   Collection Time: 07/18/16  3:07 PM  Result Value Ref Range   Opiates NONE DETECTED NONE DETECTED   Cocaine POSITIVE (Edwin) NONE DETECTED   Benzodiazepines NONE DETECTED NONE DETECTED   Amphetamines NONE DETECTED NONE DETECTED   Tetrahydrocannabinol NONE DETECTED NONE DETECTED   Barbiturates NONE DETECTED NONE DETECTED    Comment:        DRUG SCREEN FOR MEDICAL PURPOSES ONLY.  IF CONFIRMATION IS NEEDED FOR ANY PURPOSE, NOTIFY LAB WITHIN 5 DAYS.        LOWEST DETECTABLE LIMITS FOR URINE DRUG SCREEN Drug Class       Cutoff (ng/mL) Amphetamine      1000 Barbiturate      200 Benzodiazepine   834 Tricyclics       196 Opiates          300 Cocaine          300 THC              50   Urinalysis, Routine w reflex microscopic (not at [facility])     Status: None   Collection Time: 07/18/16  3:07 PM  Result Value Ref Range   Color, Urine YELLOW YELLOW   APPearance CLEAR CLEAR   Specific Gravity, Urine 1.025 1.005 - 1.030   pH 6.0 5.0 - 8.0   Glucose, UA NEGATIVE NEGATIVE mg/dL   Hgb urine dipstick NEGATIVE NEGATIVE   Bilirubin Urine NEGATIVE NEGATIVE   Ketones, ur NEGATIVE NEGATIVE mg/dL   Protein, ur NEGATIVE NEGATIVE mg/dL   Nitrite NEGATIVE NEGATIVE   Leukocytes, UA NEGATIVE NEGATIVE    Comment: MICROSCOPIC NOT DONE ON URINES WITH NEGATIVE PROTEIN, BLOOD, LEUKOCYTES, NITRITE, OR GLUCOSE <1000 mg/dL.  Valproic acid level     Status: None   Collection Time: 07/18/16  3:53 PM  Result Value Ref Range   Valproic Acid Lvl 90 50.0 - 100.0 ug/mL  Ammonia     Status: Abnormal   Collection Time: 07/18/16  3:53 PM  Result Value Ref Range   Ammonia 81 (H) 9 - 35 umol/L  MRSA PCR Screening     Status: None   Collection Time: 07/18/16  4:38 PM  Result Value Ref Range   MRSA by PCR NEGATIVE NEGATIVE    Comment:        The GeneXpert MRSA Assay (FDA approved for NASAL specimens only), is one component of Edwin comprehensive MRSA colonization surveillance  program. It is not intended to diagnose MRSA infection nor to guide or monitor treatment for MRSA infections.   Valproic acid level     Status: None   Collection Time: 07/18/16  7:32 PM  Result Value Ref Range   Valproic Acid Lvl 88 50.0 - 100.0 ug/mL  Ammonia     Status: Abnormal   Collection Time: 07/18/16  7:32 PM  Result Value Ref Range   Ammonia 455 (H) 9 - 35 umol/L  Comprehensive metabolic panel  Status: Abnormal   Collection Time: 07/18/16  7:32 PM  Result Value Ref Range   Sodium 138 135 - 145 mmol/L   Potassium 4.0 3.5 - 5.1 mmol/L   Chloride 110 101 - 111 mmol/L   CO2 22 22 - 32 mmol/L   Glucose, Bld 133 (H) 65 - 99 mg/dL   BUN 8 6 - 20 mg/dL   Creatinine, Ser 0.90 0.61 - 1.24 mg/dL   Calcium 8.3 (L) 8.9 - 10.3 mg/dL   Total Protein 5.6 (L) 6.5 - 8.1 g/dL   Albumin 3.4 (L) 3.5 - 5.0 g/dL   AST 31 15 - 41 U/L   ALT 20 17 - 63 U/L   Alkaline Phosphatase 49 38 - 126 U/L   Total Bilirubin 0.3 0.3 - 1.2 mg/dL   GFR calc non Af Amer >60 >60 mL/min   GFR calc Af Amer >60 >60 mL/min    Comment: (NOTE) The eGFR has been calculated using the CKD EPI equation. This calculation has not been validated in all clinical situations. eGFR's persistently <60 mL/min signify possible Chronic Kidney Disease.    Anion gap 6 5 - 15  Valproic acid level     Status: Abnormal   Collection Time: 07/19/16  2:02 AM  Result Value Ref Range   Valproic Acid Lvl 117 (H) 50.0 - 100.0 ug/mL  Ammonia     Status: Abnormal   Collection Time: 07/19/16  2:02 AM  Result Value Ref Range   Ammonia 76 (H) 9 - 35 umol/L  Comprehensive metabolic panel     Status: Abnormal   Collection Time: 07/19/16  2:02 AM  Result Value Ref Range   Sodium 139 135 - 145 mmol/L   Potassium 3.7 3.5 - 5.1 mmol/L   Chloride 110 101 - 111 mmol/L   CO2 24 22 - 32 mmol/L   Glucose, Bld 78 65 - 99 mg/dL   BUN 9 6 - 20 mg/dL   Creatinine, Ser 0.83 0.61 - 1.24 mg/dL   Calcium 8.3 (L) 8.9 - 10.3 mg/dL   Total Protein  5.5 (L) 6.5 - 8.1 g/dL   Albumin 3.2 (L) 3.5 - 5.0 g/dL   AST 25 15 - 41 U/L   ALT 19 17 - 63 U/L   Alkaline Phosphatase 50 38 - 126 U/L   Total Bilirubin 0.3 0.3 - 1.2 mg/dL   GFR calc non Af Amer >60 >60 mL/min   GFR calc Af Amer >60 >60 mL/min    Comment: (NOTE) The eGFR has been calculated using the CKD EPI equation. This calculation has not been validated in all clinical situations. eGFR's persistently <60 mL/min signify possible Chronic Kidney Disease.    Anion gap 5 5 - 15  Ammonia     Status: Abnormal   Collection Time: 07/19/16  8:37 AM  Result Value Ref Range   Ammonia 200 (H) 9 - 35 umol/L  Comprehensive metabolic panel     Status: Abnormal   Collection Time: 07/19/16  8:37 AM  Result Value Ref Range   Sodium 141 135 - 145 mmol/L   Potassium 4.1 3.5 - 5.1 mmol/L   Chloride 114 (H) 101 - 111 mmol/L   CO2 24 22 - 32 mmol/L   Glucose, Bld 89 65 - 99 mg/dL   BUN 6 6 - 20 mg/dL   Creatinine, Ser 0.81 0.61 - 1.24 mg/dL   Calcium 8.3 (L) 8.9 - 10.3 mg/dL   Total Protein 5.4 (L) 6.5 - 8.1 g/dL  Albumin 3.1 (L) 3.5 - 5.0 g/dL   AST 21 15 - 41 U/L   ALT 19 17 - 63 U/L   Alkaline Phosphatase 42 38 - 126 U/L   Total Bilirubin 0.5 0.3 - 1.2 mg/dL   GFR calc non Af Amer >60 >60 mL/min   GFR calc Af Amer >60 >60 mL/min    Comment: (NOTE) The eGFR has been calculated using the CKD EPI equation. This calculation has not been validated in all clinical situations. eGFR's persistently <60 mL/min signify possible Chronic Kidney Disease.    Anion gap 3 (L) 5 - 15  Valproic acid level     Status: Abnormal   Collection Time: 07/19/16  8:37 AM  Result Value Ref Range   Valproic Acid Lvl 142 (H) 50.0 - 100.0 ug/mL    Current Facility-Administered Medications  Medication Dose Route Frequency Provider Last Rate Last Dose  . 0.9 %  sodium chloride infusion   Intravenous Continuous Maryellen Pile, MD 150 mL/hr at 07/19/16 0750    . abacavir-dolutegravir-lamiVUDine (TRIUMEQ)  600-50-300 MG per tablet 1 tablet  1 tablet Oral QHS Maryellen Pile, MD   1 tablet at 07/18/16 2129  . acetaminophen (TYLENOL) tablet 650 mg  650 mg Oral Q6H PRN Maryellen Pile, MD       Or  . acetaminophen (TYLENOL) suppository 650 mg  650 mg Rectal Q6H PRN Maryellen Pile, MD      . albuterol (PROVENTIL) (2.5 MG/3ML) 0.083% nebulizer solution 2.5 mg  2.5 mg Nebulization Q4H PRN Maryellen Pile, MD      . enoxaparin (LOVENOX) injection 40 mg  40 mg Subcutaneous Q24H Maryellen Pile, MD   40 mg at 07/18/16 1726  . levOCARNitine (CARNITOR) 200 MG/ML injection 1,500 mg  1,500 mg Intravenous Q6H Alexa R Quay Burow, MD   1,500 mg at 07/19/16 0513  . sodium chloride flush (NS) 0.9 % injection 3 mL  3 mL Intravenous Q12H Maryellen Pile, MD   3 mL at 07/18/16 2130    Musculoskeletal: Strength & Muscle Tone: within normal limits Gait & Station: normal Patient leans: N/Edwin  Psychiatric Specialty Exam: Physical Exam  Psychiatric: His affect is blunt. His speech is delayed. He is slowed and withdrawn. Cognition and memory are normal. He expresses impulsivity. He exhibits Edwin depressed mood. He expresses suicidal ideation. He expresses suicidal plans.    Review of Systems  Constitutional: Positive for malaise/fatigue.  HENT: Negative.   Eyes: Negative.   Respiratory: Negative.   Cardiovascular: Negative.   Gastrointestinal: Negative.   Genitourinary: Negative.   Musculoskeletal: Negative.   Skin: Negative.   Neurological: Negative.   Endo/Heme/Allergies: Negative.   Psychiatric/Behavioral: Positive for depression, substance abuse and suicidal ideas. The patient has insomnia.     Blood pressure 125/77, pulse (!) 54, temperature 98.4 F (36.9 C), temperature source Oral, resp. rate 18, height _0  (1.702 m), weight 66.5 kg (146 lb 11.2 oz), SpO2 100 %.Body mass index is 22.98 kg/m.  General Appearance: Casual  Eye Contact:  Good  Speech:  Clear and Coherent  Volume:  Decreased  Mood:  Depressed and  Dysphoric  Affect:  Constricted  Thought Process:  Coherent  Orientation:  Full (Time, Place, and Person)  Thought Content:  Logical  Suicidal Thoughts:  Yes.  with intent/plan  Homicidal Thoughts:  No  Memory:  Immediate;   Good Recent;   Good Remote;   Good  Judgement:  Poor  Insight:  Shallow  Psychomotor Activity:  Psychomotor Retardation  Concentration:  Concentration: Good and Attention Span: Good  Recall:  Good  Fund of Knowledge:  Good  Language:  Good  Akathisia:  No  Handed:  Right  AIMS (if indicated):     Assets:  Communication Skills Desire for Improvement  ADL's:  Intact  Cognition:  WNL  Sleep:   poor     Treatment Plan Summary: -Crisis stabilization -Salicylate level-Naproxen OD -Hold all psych medications until ammonia and Valproic acid level are within normal range. -Continue 1:1 sitter for safety-patient unable to contract for safety. -Daily contact with patient to assess and evaluate symptoms and progress in treatment.  Disposition: Recommend psychiatric Inpatient admission when medically cleared. Supportive therapy provided about ongoing stressors.  Corena Pilgrim, MD 07/19/2016 12:23 PM

## 2016-07-19 NOTE — Progress Notes (Signed)
EKG CRITICAL VALUE     12 lead EKG performed.  Critical value noted.  Joni ReiningNicole, RN notified.   Wandalee FerdinandKimberly Logan, [place] 07/19/2016 8:37 AM

## 2016-07-19 NOTE — Progress Notes (Signed)
   Subjective: No acute events overnight. Patient denied chest pain, shortness of breath, nausea, vomiting or abdominal pain. He denied diarrhea or constipation. He has no questions or concerns this morning.    Objective:  Vital signs in last 24 hours: Vitals:   07/18/16 2132 07/18/16 2249 07/19/16 0400 07/19/16 0801  BP: 124/60 (!) 108/59 112/68 118/75  Pulse: 63 62 (!) 51 (!) 46  Resp: 20 14 (!) 21 17  Temp: 98.5 F (36.9 C) 98.4 F (36.9 C)  97.4 F (36.3 C)  TempSrc: Oral Oral  Oral  SpO2: 98% 98% 98% 99%  Weight:      Height:       Physical Exam  Constitutional: He is oriented to person, place, and time. He appears well-developed and well-nourished.  HENT:  Head: Normocephalic and atraumatic.  Poor dentition and oral health  Cardiovascular: Regular rhythm.  Exam reveals no gallop and no friction rub.   No murmur heard. Bradycardic on auscultation  Respiratory: Effort normal and breath sounds normal.  GI: Soft. Bowel sounds are normal. He exhibits no distension. There is no tenderness.  Musculoskeletal: He exhibits no edema.  Neurological: He is alert and oriented to person, place, and time.  Skin:  Multiple tattoos present on his bilateral upper and lower extremities  Psychiatric:  Extremely depressed, flat affect, endorsing suicidal ideation     Assessment/Plan: Overnight the patient's ammonia level continued to rise and peaked at 455. Per recommendations by poison control the patient was given levocarnitine. The patient's valproic acid level at presentation was 144 and appeared to trend downward to 90, 88 and has risen back to 117. We will continue to trend this lab. EKG shows sinus bradycardia, ST elevations and bradycardia without QTc prolongation. Patient asymptomatic and mentating appropriately. We will continue to trend valproic acid levels and treat accordingly. Psychiatry was consulted yesterday and will see the patient today   1 suicide attempt with  pharmacological overdose For details regarding the patient's suicide attempt and important medical history including Huntington's disease in his father please see the H&P admission note. Overnight, the patient's ammonia level appeared to peak at 455 and trended downward to 76 however repeat labs from this morning show increased elevation of ammonia at 200. Similarly, the valproic acid level also appeared to peak at 144 yesterday afternoon and trended down to 88, 90 but has since increased 117. Patient's repeat EKG shows sinus bradycardia with ST segment elevations. The patient has never had chest pain or shortness of breath. Troponins are negative. The most likely cause of the patient's bradycardia is secondary to overdose. We'll continue to monitor him with telemetry and repeat EKG this evening. -- Valproic acid and ammonia level today to ensure levels do not continue to rise, if ammonia level continues to rise re-dose l-carnitine. -- Aggressive hydration, given 2 L of normal saline in the emergency department, 150 ML's normal saline per hour  -- Repeat EKG at 1700 -- Suicide precautions -- Psychiatry consult  2. HIV infection The patient has a HIV infection follows with Dr. Haynes DageSpeight of infectious diseases. His most recent CD4 count was 1250 on 09/11/2015. -- Continue Triumeq  3. Asthma Patient has a history of asthma. He says he has been out of his medications. He was diffusely wheezy on examination. -- Albuterol nebulizer every 4 hours as needed  DVT/PE prophylaxis: Lovenox FEN/GI: Normal diet  Dispo: Anticipated discharge in approximately 1-2 day(s).   Thomasene LotJames Donal Lynam, MD 07/19/2016, 11:00 AM Pager: [phone]

## 2016-07-19 NOTE — Progress Notes (Addendum)
I spoke with poison control this afternoon. The max dose of l-carnitine is 6 g. Our patient has received 6260 mg which would be the max dose. We'll discontinue the use of l-carnitine at this time. Poison control has advised that if the patient's Depakote level and ammonia level have increased at their next lab draw at 1600 hrs. to immediately call them and they will arrange for the provider to speak with a toxicologist to consider additional doses of activated charcoal or other measures.  Poison control is aware of the patient and is actively following along. The number for [facility] and [place] is [phone].

## 2016-07-20 LAB — COMPREHENSIVE METABOLIC PANEL
ALBUMIN: 3 g/dL — AB (ref 3.5–5.0)
ALT: 16 U/L — ABNORMAL LOW (ref 17–63)
AST: 18 U/L (ref 15–41)
Alkaline Phosphatase: 51 U/L (ref 38–126)
Anion gap: 6 (ref 5–15)
BUN: 8 mg/dL (ref 6–20)
CO2: 24 mmol/L (ref 22–32)
Calcium: 8.2 mg/dL — ABNORMAL LOW (ref 8.9–10.3)
Chloride: 110 mmol/L (ref 101–111)
Creatinine, Ser: 0.89 mg/dL (ref 0.61–1.24)
GFR calc Af Amer: >60 mL/min (ref >60)
GLUCOSE: 89 mg/dL (ref 65–99)
POTASSIUM: 3.4 mmol/L — AB (ref 3.5–5.1)
Sodium: 140 mmol/L (ref 135–145)
Total Bilirubin: 0.3 mg/dL (ref 0.3–1.2)
Total Protein: 5.1 g/dL — ABNORMAL LOW (ref 6.5–8.1)

## 2016-07-20 LAB — VALPROIC ACID LEVEL
VALPROIC ACID LVL: 52 ug/mL (ref 50.0–100.0)
Valproic Acid Lvl: 63 ug/mL (ref 50.0–100.0)
Valproic Acid Lvl: 45 ug/mL — ABNORMAL LOW (ref 50.0–100.0)

## 2016-07-20 LAB — AMMONIA
AMMONIA: 67 umol/L — AB (ref 9–35)
AMMONIA: 55 umol/L — AB (ref 9–35)
Ammonia: 66 umol/L — ABNORMAL HIGH (ref 9–35)

## 2016-07-20 LAB — T-HELPER CELLS (CD4) COUNT (NOT AT ARMC)
CD4 % Helper T Cell: 44 % (ref 33–55)
CD4 T Cell Abs: 2020 /uL (ref 400–2700)

## 2016-07-20 MED ORDER — POTASSIUM CHLORIDE CRYS ER 20 MEQ PO TBCR
40.0000 meq | EXTENDED_RELEASE_TABLET | Freq: Once | ORAL | Status: AC
  Administered 2016-07-20: 40 meq via ORAL
  Filled 2016-07-20: qty 2

## 2016-07-20 NOTE — Progress Notes (Signed)
   Subjective: No acute events overnight. Patient was somnolent on examination this morning. He denies headache, chest pain or shortness of breath. He denies nausea, vomiting or abdominal pain. He has no additional questions this morning.  Objective:  Vital signs in last 24 hours: Vitals:   07/19/16 1950 07/20/16 0016 07/20/16 0400 07/20/16 0800  BP: 137/69 (!) 102/56 (!) 104/57 121/79  Pulse: 63 (!) 44 (!) 41 (!) 43  Resp: 17 12 13 13   Temp: 98.5 F (36.9 C) 98.5 F (36.9 C) 97.7 F (36.5 C) 97.2 F (36.2 C)  TempSrc: Oral Oral Oral Axillary  SpO2: 99% 99% 99% 100%  Weight:      Height:       Physical Exam  Constitutional: He is oriented to person, place, and time. He appears well-developed and well-nourished.  HENT:  Head: Normocephalic and atraumatic.  Extremely poor oral dentition  Cardiovascular: Exam reveals no friction rub.   No murmur heard. Bradycardic on auscultation  Respiratory: Effort normal and breath sounds normal. No respiratory distress. He has no wheezes.  GI: Soft. Bowel sounds are normal. He exhibits no distension. There is no tenderness.  Musculoskeletal: He exhibits no edema.  Neurological: He is alert and oriented to person, place, and time.  Skin:  Multiple tattoos present on his bilateral upper and lower extremities  Psychiatric:  Blunted affect, depressed     Assessment/Plan: Overnight, the patient's valproic acid level has started to decrease. His levels over the past 24 hours are as follows 117, 142, 108 and most recently 85. He reached a maximum dosage of l-carnitine of 6 g. His ammonia level has also begun to trend down as follows in the last 24 hours 200, 50 and most recently 58. Poison control continues to follow. The patient remains alert and oriented. He is bradycardic but his EKG does not show evidence of QTc prolongation. He was evaluated by psychiatry and is deemed necessary that he have a behavioral health inpatient admission following  medical stabilization. We will continue to monitor him today and anticipate for psychiatric placement in the next day or 2.  1 suicide attempt with pharmacological overdose -- Continue to trend ammonia valproic acid level -- Aggressive hydration with normal saline at 150 mL per hour -- Suicide precautions -- Telemetry -- Hold all psychiatric medications at this time per psychiatry recommendation  2. HIV infection The patient has a HIV infection follows with Dr. Haynes DageSpeight of infectious diseases. His most recent CD4 count was 1250 on 09/11/2015. -- Continue Triumeq  3. Asthma Patient has a history of asthma. He says he has been out of his medications. He was diffusely wheezy on examination. -- Albuterol nebulizer every 4 hours as needed  DVT/PE prophylaxis: Lovenox FEN/GI:Normal diet  Dispo: Anticipated discharge to inpatient behavioral health when the patient is medically stable and 1-2 days.    Thomasene LotJames Mykenna Viele, MD 07/20/2016, 8:09 AM Pager: [phone]

## 2016-07-20 NOTE — Progress Notes (Signed)
CSW aware of inpatient psych recommendation- CSW cannot begin referral process until pt is documented as medically stable for DC- per MD note pt will be ready in 1-2 more days.  CSW will continue to follow and make referrals when appropriate.  Burna SisJenna H. Daylyn Christine, LCSW Clinical Social Worker [phone]

## 2016-07-21 DIAGNOSIS — F322 Major depressive disorder, single episode, severe without psychotic features: Secondary | ICD-10-CM

## 2016-07-21 DIAGNOSIS — R001 Bradycardia, unspecified: Secondary | ICD-10-CM

## 2016-07-21 DIAGNOSIS — Z21 Asymptomatic human immunodeficiency virus [HIV] infection status: Secondary | ICD-10-CM

## 2016-07-21 DIAGNOSIS — R5383 Other fatigue: Secondary | ICD-10-CM

## 2016-07-21 DIAGNOSIS — T50902A Poisoning by unspecified drugs, medicaments and biological substances, intentional self-harm, initial encounter: Secondary | ICD-10-CM

## 2016-07-21 DIAGNOSIS — R825 Elevated urine levels of drugs, medicaments and biological substances: Secondary | ICD-10-CM

## 2016-07-21 DIAGNOSIS — J45909 Unspecified asthma, uncomplicated: Secondary | ICD-10-CM

## 2016-07-21 LAB — COMPREHENSIVE METABOLIC PANEL
ALT: 15 U/L — AB (ref 17–63)
AST: 19 U/L (ref 15–41)
Albumin: 3.3 g/dL — ABNORMAL LOW (ref 3.5–5.0)
Alkaline Phosphatase: 62 U/L (ref 38–126)
Anion gap: 6 (ref 5–15)
BUN: 9 mg/dL (ref 6–20)
CALCIUM: 9.2 mg/dL (ref 8.9–10.3)
CHLORIDE: 106 mmol/L (ref 101–111)
CO2: 26 mmol/L (ref 22–32)
CREATININE: 0.9 mg/dL (ref 0.61–1.24)
Glucose, Bld: 83 mg/dL (ref 65–99)
Potassium: 3.8 mmol/L (ref 3.5–5.1)
Sodium: 138 mmol/L (ref 135–145)
Total Bilirubin: 0.4 mg/dL (ref 0.3–1.2)
Total Protein: 5.8 g/dL — ABNORMAL LOW (ref 6.5–8.1)

## 2016-07-21 LAB — VALPROIC ACID LEVEL: VALPROIC ACID LVL: 39 ug/mL — AB (ref 50.0–100.0)

## 2016-07-21 LAB — AMMONIA: AMMONIA: 50 umol/L — AB (ref 9–35)

## 2016-07-21 NOTE — Progress Notes (Signed)
The patient is medically clear for discharge today. He will need an inpatient behavioral health admission for ongoing psychiatric issues and suicidal ideation. I will have a formal progress note later this morning. However, the primary medical team has cleared the patient medically for inpatient admission and CSW can work on referrals at this time.

## 2016-07-21 NOTE — Progress Notes (Signed)
   Subjective: No acute events overnight. Patient denies headache, shortness of breath or chest pain. He denies nausea, vomiting or abdominal pain. He denies diarrhea or constipation. He had no additional questions morning.  He is medically stable and ready for discharge.  Objective:  Vital signs in last 24 hours: Vitals:   07/20/16 1900 07/20/16 2340 07/21/16 0523 07/21/16 0730  BP: (!) 149/79 120/68 124/71   Pulse: (!) 55 (!) 50 (!) 49   Resp: 18 18 16    Temp: 98.8 F (37.1 C) 98.7 F (37.1 C) 97.9 F (36.6 C) 97.7 F (36.5 C)  TempSrc: Oral Oral Oral Oral  SpO2: 99% 100% 100%   Weight:      Height:       Physical Exam  Constitutional: He is oriented to person, place, and time. He appears well-developed and well-nourished.  Lethargic, sleeping but appropriate, in no acute distress  HENT:  Head: Normocephalic and atraumatic.  Cardiovascular: Regular rhythm.  Exam reveals no gallop and no friction rub.   No murmur heard. Bradycardic on auscultation  Respiratory: Effort normal and breath sounds normal. No respiratory distress. He has no wheezes.  GI: Soft. Bowel sounds are normal. He exhibits no distension. There is no tenderness.  Musculoskeletal: He exhibits no edema.  Neurological: He is alert and oriented to person, place, and time.  Skin:  Multiple tattoos present on his bilateral upper and lower extremities  Psychiatric:  Depressed mood, flat affect     Assessment/Plan: In summary, patient has improved and is medically clear for discharge. His valproic acid levels are within normal limits. His ammonia is only slightly elevated. He denies chest pain or shortness of breath. He denies nausea vomiting or abdominal pain. He has no medical complaints or issues at this time.  1 suicide attempt with pharmacological overdose, stable -- He is medically stable and will need a formal psychiatric inpatient admission. He is clear from our service at this time. -- Suicide  precautions -- Telemetry -- Hold all psychiatric medications at this time per psychiatry recommendation  2. HIV infection The patient has a HIV infection follows with Dr. Haynes DageSpeight of infectious diseases. His most recent CD4 count was 1250 on 09/11/2015. -- Continue Triumeq  3. Asthma Patient has a history of asthma. He says he has been out of his medications. He was diffusely wheezy on examination. -- Albuterol nebulizer every 4 hours as needed  DVT/PE prophylaxis: Lovenox FEN/GI:Normal diet  Dispo: Patient is medically stable for discharge pending placement.   Edwin LotJames Laurren Lepkowski, MD 07/21/2016, 11:31 AM Pager: [phone]

## 2016-07-22 NOTE — Progress Notes (Signed)
   Subjective: No acute events overnight. Patient is much more awake, alert and oriented today. He was smiling and laughing when I was talking with him this morning. He is extremely excited about going to behavioral health and is looking forward to getting medications that will work to treat his depression and ongoing psychiatric issues. He did mention he was supposed to be in court today but will miss this appointment because he is currently inpatient following a suicide attempt. I will ask clinical social work to look into these issues and see what we need to do to show that the patient was under our care at this time. He denies headache or changes in vision. He denies chest pain or shortness of breath. He denies nausea, vomiting or abdominal pain. He denies constipation, diarrhea, polyuria or dysuria. He is much improved from a medical standpoint. Currently, we are awaiting placement with behavioral health.  Objective:  Vital signs in last 24 hours: Vitals:   07/21/16 1625 07/21/16 1922 07/21/16 2323 07/22/16 0317  BP: 130/78 (!) 151/72 131/70 120/80  Pulse: (!) 57 (!) 56 (!) 45 (!) 48  Resp: (!) 21 14 16 14   Temp: 98.8 F (37.1 C) 97.9 F (36.6 C) 98.1 F (36.7 C) 98 F (36.7 C)  TempSrc: Oral Oral Oral Oral  SpO2: 98% 100% 99% 98%  Weight:      Height:       Physical Exam  Constitutional: He is oriented to person, place, and time. He appears well-developed and well-nourished.  Sitting up in bed, normally conversant  HENT:  Head: Normocephalic and atraumatic.  Cardiovascular: Regular rhythm.  Exam reveals no gallop and no friction rub.   No murmur heard. Bradycardic on auscultation  Respiratory: Effort normal and breath sounds normal. No respiratory distress. He has no wheezes.  GI: Soft. Bowel sounds are normal. He exhibits no distension. There is no tenderness.  Musculoskeletal: Normal range of motion. He exhibits no edema.  Neurological: He is alert and oriented to person,  place, and time.  Skin:  Multiple tattoos present on his bilateral upper and lower extremities  Psychiatric:  Still depressed, more interactive on discussion, affect is no longer blunted     Assessment/Plan: In summary, patient has improved and is medically clear for discharge. His valproic acid levels are within normal limits. His ammonia is only slightly elevated. He denies chest pain or shortness of breath. He denies nausea vomiting or abdominal pain. He has no medical complaints or issues at this time. Overall, his mood seems to have improved. He continues to have suicidal ideation. He is excited about going to behavioral health and starting medications that may help with his ongoing depression.  1 suicide attempt with pharmacological overdose, stable -- He is medically stable and will need a formal psychiatric inpatient admission. He is clear from our service at this time. -- Suicide precautions -- Telemetry -- Hold all psychiatric medications at this time per psychiatry recommendation  2. HIV infection The patient has a HIV infection follows with Dr. Haynes DageSpeight of infectious diseases. His most recent CD4 count was 1250 on 09/11/2015. -- Continue Triumeq  3. Asthma Patient has a history of asthma. He says he has been out of his medications. He was diffusely wheezy on examination. -- Albuterol nebulizer every 4 hours as needed  DVT/PE prophylaxis: Lovenox FEN/GI:Normal diet  Dispo: Anticipated discharge today pending placement.   Thomasene LotJames Annaliz Aven, MD 07/22/2016, 7:27 AM Pager: [phone]

## 2016-07-22 NOTE — Clinical Social Work Psych Note (Signed)
Psych CSW received consult for inpatient psychiatric hospitalization.  Psych CSW made clinical referral to [facility] and [facility].  No available beds at this time.  Of note, patient has no payer source and the community has very limited resources in regards to psychiatric sponsorship beds.  Psych CSW will continue to assist in finding placement.  Disposition: Inpatient psychiatric hospitalization  Edwin Martinez, MSW, LCSW  [phone]  Licensed Clinical Social Worker

## 2016-07-22 NOTE — Progress Notes (Signed)
NURSING PROGRESS NOTE  Edwin Martinez [id number] Transfer Data: 07/22/2016 7:46 PM Attending Provider: Earl LagosNischal Narendra, MD PCP:No PCP Per Patient Code Status: FULL   Edwin Martinez is a 30 y.o. male patient transferred from [facility]  -No acute distress noted.  -No complaints of shortness of breath.  -No complaints of chest pain.   Last Documented Vital Signs: Blood pressure 127/76, pulse (!) 52, temperature 98.1 F (36.7 C), temperature source Oral, resp. rate (!) 24, height 5\' 7"  (1.702 m), weight 66.5 kg (146 lb 11.2 oz), SpO2 99 %.   Allergies:  Magnesium-containing compounds; Peanut-containing drug products; Atripla [efavirenz-emtricitab-tenofovir]; Esomeprazole magnesium; Bactrim [sulfamethoxazole-trimethoprim]; and Penicillins  Past Medical History:   has a past medical history of ADHD (attention deficit hyperactivity disorder); Anxiety; Asthma; Bipolar 1 disorder (HCC); Bipolar disorder (HCC); Epileptic seizures (HCC); HIV (human immunodeficiency virus infection) (HCC); Hypertension; Schizophrenia (HCC); and Seizures (HCC).  Past Surgical History:   has a past surgical history that includes Dental surgery.  Social History:   reports that he has been smoking Cigarettes.  He started smoking about 24 years ago. He has been smoking about 0.50 packs per day. He has never used smokeless tobacco. He reports that he drinks about 1.2 oz of alcohol per week . He reports that he uses drugs, including Marijuana and Cocaine, about 2 times per week.  Skin: intact  Patient/Family orientated to room. Information packet given to patient/family. Admission inpatient armband information verified with patient/family to include name and date of birth and placed on patient arm. Side rails up x 2, fall assessment and education completed with patient/family. Patient/family able to verbalize understanding of risk associated with falls and verbalized understanding to call for assistance before getting out  of bed. Call light within reach. Patient/family able to voice and demonstrate understanding of unit orientation instructions.

## 2016-07-22 NOTE — Progress Notes (Signed)
Report called to 5w RN. Pt transferred to 5w27 with sitter and all belongings.

## 2016-07-23 NOTE — Progress Notes (Signed)
   Subjective: No acute events overnight. Patient is doing fine from medical standpoint. He denies headaches, shortness of breath or chest pain. He denies nausea, vomiting or abdominal pain. He has good by mouth intake. He had no additional questions this morning.  Objective:  Vital signs in last 24 hours: Vitals:   07/22/16 0744 07/22/16 1206 07/22/16 2117 07/23/16 0528  BP: 124/88 127/76 134/61 121/69  Pulse: (!) 45 (!) 52 61 (!) 44  Resp: (!) 24  18 16   Temp: 98.1 F (36.7 C) 98.1 F (36.7 C) 98.2 F (36.8 C) 97.9 F (36.6 C)  TempSrc: Oral Oral Oral Oral  SpO2: 99%  100% 100%  Weight:      Height:       Physical Exam  Constitutional: He is oriented to person, place, and time. He appears well-developed and well-nourished.  HENT:  Head: Normocephalic and atraumatic.  Cardiovascular: Normal rate and regular rhythm.  Exam reveals no friction rub.   No murmur heard. Respiratory: Effort normal and breath sounds normal. No respiratory distress. He has no wheezes.  GI: Soft. Bowel sounds are normal. He exhibits no distension. There is no tenderness.  Musculoskeletal: He exhibits no edema.  Neurological: He is alert and oriented to person, place, and time.  Skin:  Multiple tattoos present on his bilateral upper and lower extremities  Psychiatric:  Depressed     Assessment/Plan:  In summary, patient is medically clear and appropriate for discharge to inpatient psychiatric or behavioral health Hospital.   1 suicide attempt with pharmacological overdose, stable -- He is medically stable and will need a formal psychiatric inpatient admission. He is clear from our service at this time. -- Suicide precautions -- Hold all psychiatric medications at this time per psychiatry recommendation  2. HIV infection The patient has a HIV infection follows with Dr. Haynes DageSpeight of infectious diseases. His most recent CD4 count was 1250 on 09/11/2015. -- Continue Triumeq  3. Asthma Patient  has a history of asthma. He says he has been out of his medications. He was diffusely wheezy on examination. -- Albuterol nebulizer every 4 hours as needed  DVT/PE prophylaxis: Lovenox FEN/GI:Normal diet   Dispo: Medically appropriate for discharge pending placement.   Thomasene LotJames Tenille Morrill, MD 07/23/2016, 8:50 AM Pager: [phone]

## 2016-07-23 NOTE — Progress Notes (Signed)
[facility] reviewing [facility] and will contact CSW about availability.  Osborne Cascoadia Yousaf Sainato LCSWA [phone]

## 2016-07-24 ENCOUNTER — Inpatient Hospital Stay
Admission: RE | Admit: 2016-07-24 | Discharge: 2016-07-29 | DRG: 885 | Disposition: A | Discharge status: Home / Self Care | Payer: No Typology Code available for payment source | Source: Intra-hospital | Attending: Psychiatry | Admitting: Psychiatry

## 2016-07-24 DIAGNOSIS — F314 Bipolar disorder, current episode depressed, severe, without psychotic features: Secondary | ICD-10-CM | POA: Diagnosis not present

## 2016-07-24 DIAGNOSIS — F1220 Cannabis dependence, uncomplicated: Secondary | ICD-10-CM | POA: Diagnosis present

## 2016-07-24 DIAGNOSIS — Z880 Allergy status to penicillin: Secondary | ICD-10-CM | POA: Diagnosis not present

## 2016-07-24 DIAGNOSIS — Z9114 Patient's other noncompliance with medication regimen: Secondary | ICD-10-CM | POA: Diagnosis not present

## 2016-07-24 DIAGNOSIS — Z23 Encounter for immunization: Secondary | ICD-10-CM | POA: Diagnosis not present

## 2016-07-24 DIAGNOSIS — Z881 Allergy status to other antibiotic agents status: Secondary | ICD-10-CM

## 2016-07-24 DIAGNOSIS — F17210 Nicotine dependence, cigarettes, uncomplicated: Secondary | ICD-10-CM | POA: Diagnosis present

## 2016-07-24 DIAGNOSIS — I10 Essential (primary) hypertension: Secondary | ICD-10-CM | POA: Diagnosis present

## 2016-07-24 DIAGNOSIS — Z915 Personal history of self-harm: Secondary | ICD-10-CM

## 2016-07-24 DIAGNOSIS — J45909 Unspecified asthma, uncomplicated: Secondary | ICD-10-CM | POA: Diagnosis present

## 2016-07-24 DIAGNOSIS — F411 Generalized anxiety disorder: Secondary | ICD-10-CM | POA: Diagnosis present

## 2016-07-24 DIAGNOSIS — F329 Major depressive disorder, single episode, unspecified: Secondary | ICD-10-CM | POA: Diagnosis present

## 2016-07-24 DIAGNOSIS — F1410 Cocaine abuse, uncomplicated: Secondary | ICD-10-CM | POA: Diagnosis present

## 2016-07-24 DIAGNOSIS — Z91010 Allergy to peanuts: Secondary | ICD-10-CM | POA: Diagnosis not present

## 2016-07-24 DIAGNOSIS — Z888 Allergy status to other drugs, medicaments and biological substances status: Secondary | ICD-10-CM | POA: Diagnosis not present

## 2016-07-24 DIAGNOSIS — F3130 Bipolar disorder, current episode depressed, mild or moderate severity, unspecified: Secondary | ICD-10-CM | POA: Diagnosis present

## 2016-07-24 DIAGNOSIS — F3131 Bipolar disorder, current episode depressed, mild: Secondary | ICD-10-CM | POA: Diagnosis present

## 2016-07-24 DIAGNOSIS — T50902A Poisoning by unspecified drugs, medicaments and biological substances, intentional self-harm, initial encounter: Secondary | ICD-10-CM | POA: Diagnosis present

## 2016-07-24 DIAGNOSIS — Z818 Family history of other mental and behavioral disorders: Secondary | ICD-10-CM | POA: Diagnosis not present

## 2016-07-24 DIAGNOSIS — Z720 Tobacco use: Secondary | ICD-10-CM

## 2016-07-24 DIAGNOSIS — B20 Human immunodeficiency virus [HIV] disease: Secondary | ICD-10-CM | POA: Diagnosis present

## 2016-07-24 MED ORDER — INFLUENZA VAC SPLIT QUAD 0.5 ML IM SUSY
0.5000 mL | PREFILLED_SYRINGE | INTRAMUSCULAR | Status: AC
  Administered 2016-07-25: 0.5 mL via INTRAMUSCULAR
  Filled 2016-07-24: qty 0.5

## 2016-07-24 MED ORDER — ACETAMINOPHEN 325 MG PO TABS: 650.0000 mg | ORAL_TABLET | Freq: Four times a day (QID) | ORAL | Status: DC | PRN

## 2016-07-24 MED ORDER — ALBUTEROL SULFATE HFA 108 (90 BASE) MCG/ACT IN AERS
2.0000 | INHALATION_SPRAY | Freq: Four times a day (QID) | RESPIRATORY_TRACT | Status: DC | PRN
  Filled 2016-07-24: qty 6.7

## 2016-07-24 MED ORDER — TRAZODONE HCL 100 MG PO TABS: 100.0000 mg | ORAL_TABLET | Freq: Every evening | ORAL | Status: DC | PRN

## 2016-07-24 MED ORDER — ABACAVIR-DOLUTEGRAVIR-LAMIVUD 600-50-300 MG PO TABS
1.0000 | ORAL_TABLET | Freq: Every day | ORAL | Status: DC
  Administered 2016-07-24 – 2016-07-28 (×5): 1 via ORAL
  Filled 2016-07-24 (×6): qty 1

## 2016-07-24 MED ORDER — HYDROXYZINE HCL 25 MG PO TABS
25.0000 mg | ORAL_TABLET | Freq: Three times a day (TID) | ORAL | Status: DC | PRN
  Administered 2016-07-25 – 2016-07-28 (×4): 25 mg via ORAL
  Filled 2016-07-24 (×4): qty 1

## 2016-07-24 NOTE — Progress Notes (Addendum)
Patient will DC to: [facility] Anticipated DC date: 07/24/16 Family notified: N/A Transport by: [facility] 1:45pm   Per MD patient ready for [facility]. RN, patient, and facility notified of DC. Signed voluntary form sent to facility. RN given number for report (below). [facility] transport requested for patient.    [facility] able [facility] patient today.  Receiving MD: Dr. Ardyth HarpsHernandez Bed: 320 Report #: [id number], Jamesetta Sohyllis  CSW to [facility] and schedule [facility] transportation for 1pm.  CSW signing off.  Osborne Cascoadia Ayan Yankey LCSWA [phone]

## 2016-07-24 NOTE — Plan of Care (Signed)
Problem: Coping: Goal: Ability to verbalize frustrations and anger appropriately will improve Outcome: Progressing Patient interacting with staff and peers appropriately, expressing his needs and concerns. Was able to remind staff about the medication that he is supposed to take tonight "I have to have my HIV medication, it is important".

## 2016-07-24 NOTE — Care Management Note (Signed)
Case Management Note  Patient Details  Name: Edwin Martinez MRN: [id number] Date of Birth: 1985-11-21  Subjective/Objective:   Admitted with drug overdose,    history of HIV with a most recent CD4 count of 1250 on 09/11/2015, depression, mood disorder, schizophrenia, bipolar disorder and multiple previous suicide attempts.             Action/Plan: [facility]   Expected Discharge Date:    07/24/2016          Expected Discharge Plan:     In-House Referral:   CSW  Status of Service:  completed  If discussed at Long Length of Stay Meetings, dates discussed:    Additional Comments:  Edwin Martinez, Edwin Dembeck [facility], RN 07/24/2016, 9:29 AM

## 2016-07-24 NOTE — Discharge Summary (Signed)
Name: Edwin Martinez MRN: [id number] DOB: 1985/10/13 29 y.o. PCP: No Pcp Per Patient  Date of Admission: 07/18/2016 10:14 AM Date of Discharge: 07/24/2016 Attending Physician: Earl LagosNischal Narendra, MD  Discharge Diagnosis: 1. Intentional drug overdose 2. HIV   Discharge Medications:   Medication List    STOP taking these medications   divalproex 500 MG 24 hr tablet Commonly known as:  DEPAKOTE ER   FLUoxetine 10 MG capsule Commonly known as:  PROZAC   gabapentin 300 MG capsule Commonly known as:  NEURONTIN   traZODone 150 MG tablet Commonly known as:  DESYREL     TAKE these medications   albuterol 108 (90 Base) MCG/ACT inhaler Commonly known as:  PROVENTIL HFA;VENTOLIN HFA Inhale 2 puffs into the lungs every 6 (six) hours as needed for wheezing or shortness of breath.   TRIUMEQ 600-50-300 MG tablet Generic drug:  abacavir-dolutegravir-lamiVUDine Take 1 tablet by mouth at bedtime.       Disposition and follow-up:   EdwinFrancois A Perlie Martinez was [name] in Good condition.  At the hospital follow up visit please address:  1.  Please restart the patient's psychiatric medications as appropriate. I would consider doing a formal workup and evaluation for Huntington's disease  2.  Labs / imaging needed at time of follow-up: None  3.  Pending labs/ test needing follow-up: None  Follow-up Appointments: 1. Patient will be admitted to an inpatient behavioral Health Center for management of active suicidal ideation  Hospital Course by problem list:   1. Intentional drug overdose The patient is a 30 year old African-American male with a past medical history of HIV, depression, mood disorder, schizophrenia, bipolar disorder and multiple previous suicide attempts who [name] on 07/18/2016 after an overdose. The patient reported taking approximately 50 Depakote extended-release, 15-20 Neurontin, 15-20  trazodone and approximately 30 fluoxetine pills. At the time of admission the patient stated that his "soul was tired" and that he no longer wanted to live. He has multiple stressors at home and stated that he felt like none of his medications were working. In the emergency department and on service the patient remained actively suicidal. However, he was asking and seeking medical help and wanted a behavioral health admission for ongoing psychiatric care and to initiate medications that may help with his chronic depression and comorbid psychiatric disorders.  In the emergency department the patient was afebrile and hemodynamically stable. Urine drug screen was positive for cocaine. Ethanol level was negative. Acetaminophen level was negative. Salicylate level was negative. Valproic acid and ammonia levels were both elevated. CBC was unremarkable. BMP was unremarkable. EKG showed ST segment elevations and cardiology was consulted and felt this was either secondary to recent cocaine use or overdose. A code STEMI was not called. Poison control was consulted and the patient was [facility] internal medicine teaching service for further workup and management.  Based on recommendations from poison control valproic acid and ammonia levels were checked every 6 hours until peak. Liver function tests were also trended. The patient was given aggressive hydration at 150 mL per hour following a 2 L bolus in the emergency department. He was given l-carnitine under the direction of poison control until max dose of 6 g was reached. Serial EKGs were also performed to evaluate the QTc interval. Over the next several days the patient was continued on aggressive hydration and his ammonia and valproic acid levels peaked and began to normalize.  Over the course of his admission the patient's mental status also improved. By 07/22/2016 the patient's ammonia and valproic acid levels had almost entirely normalized. The  patient was conversant and no longer lethargic and somnolent on examination. He was then deemed medically stable and cleared for discharge from the hospital for further psychiatric management to behavioral health institution.  2. HIV Patient has a history of HIV currently on heart therapy. His most recent CD4 count was 2020. Continue Triumeq as prescribed.       Discharge Vitals:   BP (!) 106/59 (BP Location: Right Arm)   Pulse (!) 43   Temp 97.7 F (36.5 C) (Oral)   Resp 16   Ht 5\' 7"  (1.702 m)   Wt 146 lb 11.2 oz (66.5 kg)   SpO2 99%   BMI 22.98 kg/m   Pertinent Labs, Studies, and Procedures:   Discharge Instructions: Discharge Instructions    Diet - low sodium heart healthy    Complete by:  As directed    Discharge instructions    Complete by:  As directed    Please work with psychiatry while in behavioral health to improve your depression. Please take all medications they prescribe.   Increase activity slowly    Complete by:  As directed       Signed: Thomasene Lot, MD 07/24/2016, 12:06 PM   Pager: [phone]

## 2016-07-24 NOTE — Progress Notes (Signed)
Patient pleasant and cooperative during admission assessment. Patient denies SI/HI at this time. Patient denies AVH. Patient informed of fall risk status, fall risk assessed "low" at this time. Patient oriented to unit/staff/room. Patient denies any questions/concerns at this time. Patient safe on unit with Q15 minute checks for safety. Skin assessment & body search done.No contraband found. 

## 2016-07-24 NOTE — Progress Notes (Signed)
   Subjective: No acute events overnight. Patient denies headache, chest pain or shortness of breath. He denies nausea, vomiting or abdominal pain. He has no additional acute complaints or concerns.  Objective:  Vital signs in last 24 hours: Vitals:   07/23/16 0528 07/23/16 1442 07/23/16 2110 07/24/16 0500  BP: 121/69 125/65 136/78 (!) 106/59  Pulse: (!) 44 (!) 52 61 (!) 43  Resp: 16 18 (!) 21 16  Temp: 97.9 F (36.6 C) 98.6 F (37 C) 97.8 F (36.6 C) 97.7 F (36.5 C)  TempSrc: Oral Oral Oral Oral  SpO2: 100% 100% 99% 99%  Weight:      Height:       Physical Exam  Constitutional: He is oriented to person, place, and time. He appears well-developed and well-nourished.  HENT:  Head: Normocephalic and atraumatic.  Cardiovascular: Regular rhythm.  Exam reveals no gallop and no friction rub.   No murmur heard. Bradycardic on auscultation  Respiratory: Effort normal and breath sounds normal. No respiratory distress. He has no wheezes.  GI: Soft. Bowel sounds are normal. He exhibits no distension. There is no tenderness.  Musculoskeletal: He exhibits no edema.  Neurological: He is alert and oriented to person, place, and time.  Skin:  Multiple tattoos present on his upper and lower bilateral extremities     Assessment/Plan:  In summary, patient is medically clear and appropriate for discharge to inpatient psychiatric or behavioral health Hospital.   1 suicide attempt with pharmacological overdose, stable -- He is medically stable and will need a formal psychiatric inpatient admission. He is clear from our service at this time. -- Suicide precautions -- Hold all psychiatric medications at this time per psychiatry recommendation  2. HIV infection The patient has a HIV infection follows with Dr. Haynes DageSpeight of infectious diseases. His most recent CD4 count was 1250 on 09/11/2015. -- Continue Triumeq  3. Asthma Patient has a history of asthma. He says he has been out of his  medications. He was diffusely wheezy on examination. -- Albuterol nebulizer every 4 hours as needed  DVT/PE prophylaxis: Lovenox FEN/GI:Normal diet  Dispo: Discharge this morning.   Thomasene LotJames Hugh Kamara, MD 07/24/2016, 12:09 PM Pager: [phone]

## 2016-07-24 NOTE — Progress Notes (Signed)
D: Patient currently in the dayroom with staff and peers. Pleasant and cooperative. No sign of distress.  A: Staff continue to provide support and encouragements. R: Patient remains pleasant and safety maintained.

## 2016-07-24 NOTE — H&P (Signed)
Nsg Discharge Note  Admit Date:  07/18/2016 Discharge date: 07/24/2016   Consuello ClossEdward A Gott [facility].  AVS completed.  Copy for chart, and copy for patient signed, and dated. Patient/caregiver able to verbalize understanding.  Discharge Medication:   Medication List    STOP taking these medications   divalproex 500 MG 24 hr tablet Commonly known as:  DEPAKOTE ER   FLUoxetine 10 MG capsule Commonly known as:  PROZAC   gabapentin 300 MG capsule Commonly known as:  NEURONTIN   traZODone 150 MG tablet Commonly known as:  DESYREL     TAKE these medications   albuterol 108 (90 Base) MCG/ACT inhaler Commonly known as:  PROVENTIL HFA;VENTOLIN HFA Inhale 2 puffs into the lungs every 6 (six) hours as needed for wheezing or shortness of breath.   TRIUMEQ 600-50-300 MG tablet Generic drug:  abacavir-dolutegravir-lamiVUDine Take 1 tablet by mouth at bedtime.       Discharge Assessment: Vitals:   07/23/16 2110 07/24/16 0500  BP: 136/78 (!) 106/59  Pulse: 61 (!) 43  Resp: (!) 21 16  Temp: 97.8 F (36.6 C) 97.7 F (36.5 C)   Skin clean, dry and intact without evidence of skin break down, no evidence of skin tears noted. IV catheter discontinued intact. Site without signs and symptoms of complications - no redness or edema noted at insertion site, patient denies c/o pain - only slight tenderness at site.  Dressing with slight pressure applied.  D/c Instructions-Education: Discharge instructions given to patient/family with verbalized understanding. D/c education completed with patient/family including follow up instructions, medication list, d/c activities limitations if indicated, with other d/c instructions as indicated by MD - patient able to verbalize understanding, all questions fully answered. Patient instructed to return to ED, call 911, or call MD for any changes in condition.  Patient escorted via WC, and D/C home via private  auto.  Osaze Hubbert Consuella Loselaine, RN 07/24/2016 2:38 PM

## 2016-07-24 NOTE — Progress Notes (Signed)
  Date: 07/24/2016  Patient name: Edwin Martinez  Medical record number: [id number]  Date of birth: 02/01/1986   I have seen and evaluated this patient.The plan of care was discussed with the house staff in detail. I agree with findings and plan as documented in Dr. Lubertha Basqueaylor's note.  Patient with no new complaints. He is stable for transfer to inpatient psychiatry for further treatment. Maintain suicide precautions. [facility] and will be [facility] today.   Earl LagosNischal Trev Boley, MD 07/24/2016, 1:51 PM

## 2016-07-24 NOTE — Progress Notes (Signed)
D: Patient received his medication and went to bed. Had no other concern. Currently sleeping, eyes closed, respirations within normal limits. No sign of discomfort. A: Staff continue to monitor for safety and other needs. R: Patient's safety and security maintained per unit protocol.

## 2016-07-24 NOTE — Plan of Care (Signed)
Problem: Activity: Goal: Interest or engagement in leisure activities will improve Outcome: Progressing Patient stayed in the milieu. Watched TV with peers, attended group and communicated his needs to staff.

## 2016-07-24 NOTE — BHH Counselor (Signed)
[facility] has been [facility].  Accepting physician is Dr. Thedore MinsAkintayo Mojeed.  Attending Physician will be Dr. Ardyth HarpsHernandez.  Patient has been assigned to room 320, by [facility] Charge Nurse [place].  Call report to [phone].  Representative/Transfer Coordinator is Dyanne Ihaalvin  [facility] Staff Lillia Abed([place], [facility], Social Worker/TTS) made aware of acceptance.

## 2016-07-25 DIAGNOSIS — F3130 Bipolar disorder, current episode depressed, mild or moderate severity, unspecified: Principal | ICD-10-CM

## 2016-07-25 MED ORDER — CITALOPRAM HYDROBROMIDE 20 MG PO TABS
10.0000 mg | ORAL_TABLET | Freq: Every day | ORAL | Status: DC
  Administered 2016-07-25 – 2016-07-26 (×2): 10 mg via ORAL
  Filled 2016-07-25 (×2): qty 1

## 2016-07-25 MED ORDER — RISPERIDONE 1 MG PO TABS
2.0000 mg | ORAL_TABLET | Freq: Every day | ORAL | Status: DC
  Administered 2016-07-26 – 2016-07-27 (×2): 2 mg via ORAL
  Filled 2016-07-25 (×2): qty 2

## 2016-07-25 MED ORDER — BUSPIRONE HCL 5 MG PO TABS
5.0000 mg | ORAL_TABLET | Freq: Three times a day (TID) | ORAL | Status: DC
  Administered 2016-07-25 – 2016-07-28 (×8): 5 mg via ORAL
  Filled 2016-07-25 (×8): qty 1

## 2016-07-25 NOTE — BHH Suicide Risk Assessment (Signed)
BHH INPATIENT:  Family/Significant Other Suicide Prevention Education  Suicide Prevention Education:  Education Completed; [facility] (boyfriend [phone]) has been identified by the patient as the family member/significant other with whom the patient will be residing, and identified as the person(s) who will aid the patient in the event of a mental health crisis (suicidal ideations/suicide attempt).  With written consent from the patient, the family member/significant other has been provided the following suicide prevention education, prior to the and/or following the discharge of the patient.  The suicide prevention education provided includes the following:  Suicide risk factors  Suicide prevention and interventions  National Suicide Hotline telephone number  [facility] assessment telephone number  [facility] Emergency Assistance 911  [facility] and/or Residential Mobile Crisis Unit telephone number  Request made of family/significant other to:  Remove weapons (e.g., guns, rifles, knives), all items previously/currently identified as safety concern.    Remove drugs/medications (over-the-counter, prescriptions, illicit drugs), all items previously/currently identified as a safety concern.  The family member/significant other verbalizes understanding of the suicide prevention education information provided.  The family member/significant other agrees to remove the items of safety concern listed above.  Willean Schurman [name] MSW, LCSWA 07/25/2016, 3:53 PM

## 2016-07-25 NOTE — BHH Group Notes (Signed)
BHH Group Notes:  (Nursing/MHT/Case Management/Adjunct)  Date:  07/25/2016  Time:  5:46 AM  Type of Therapy:  Psychoeducational Skills  Participation Level:  Active  Participation Quality:  Appropriate, Sharing and Supportive  Affect:  Appropriate  Cognitive:  Appropriate  Insight:  Appropriate and Good  Engagement in Group:  Engaged  Modes of Intervention:  Discussion, Socialization and Support  Summary of Progress/Problems:  Edwin MilroyLaquanda Y [name] 07/25/2016, 5:46 AM

## 2016-07-25 NOTE — Progress Notes (Signed)
Nursing Progress Note: 7a-7p D: Pt currently presents with an anxious/appropriate affect and pleasant behavior. Pt reports to writer that their goal is to "get back in a medication regime." Pt states "all my meds at home work for me just anxiety meds need to be changed." Pt reports "good" sleep with current medication regimen.   A: Pt provided with medications per providers orders. Pt's labs and vitals were monitored throughout the day. Pt supported emotionally and encouraged to express concerns and questions. Pt educated on medications.  R: Pt's safety ensured with 15 minute and environmental checks. Pt currently denies SI/HI/Self Harm and A/V hallucinations. Pt verbally agrees to seek staff if SI/HI or A/VH occurs and to consult with staff before acting on any harmful thoughts. Will continue POC.

## 2016-07-25 NOTE — BHH Counselor (Signed)
Adult Comprehensive Assessment  Patient ID: Edwin Martinez, male   DOB: Dec 09, 1985, 30 y.o.   MRN: [id number]  Information Source: Information source: Patient  Current Stressors:  Educational / Learning stressors: n/a Employment / Job issues: Pt is unemployed and has a pending disability claim. Family Relationships: Pt states "I don't have any family and my only brother is incarceratedEngineer, petroleum" Financial / Lack of resources (include bankruptcy): [place] pay rent and lights. Housing / Lack of housing: n/a Physical health (include injuries & life threatening diseases): n/a Social relationships: Pt states he is unsure if he is still in a current relationship Substance abuse: Patient denies  Bereavement / Loss: n/a  Living/Environment/Situation:  Living Arrangements: Alone Living conditions (as described by patient or guardian): Pt states "It's fine" How long has patient lived in current situation?: year and a half What is atmosphere in current home: Comfortable  Family History:  Marital status: Single Are you sexually active?: Yes What is your sexual orientation?: homosexual Has your sexual activity been affected by drugs, alcohol, medication, or emotional stress?: n/a Does patient have children?: No  Childhood History:  By whom was/is the patient raised?: Mother/father and step-parent Additional childhood history information: Patient reports having a good childhood Description of patient's relationship with caregiver when they were a child: Good relationship with parents as a child Patient's description of current relationship with people who raised him/her: fine How were you disciplined when you got in trouble as a child/adolescent?: n/a Does patient have siblings?: Yes Number of Siblings: 1 Description of patient's current relationship with siblings: Good; close relationship that is supportive, brother is incarcerated Did patient suffer  from severe childhood neglect?: No Has patient ever been sexually abused/assaulted/raped as an adolescent or adult?: No Was the patient ever a victim of a crime or a disaster?: No Has patient been effected by domestic violence as an adult?: No  Education:  Highest grade of school patient has completed: 8th grade Currently a student?: No Learning disability?: No  Employment/Work Situation:   Employment situation: Unemployed Patient's job has been impacted by current illness: No What is the longest time patient has a held a job?: Six months Where was the patient employed at that time?: [employer] Has patient ever been in the [employer] and Companymilitary?: No Has patient ever served in combat?: No Did You Receive Any Psychiatric Treatment/Services While in Equities traderthe Military?: No Are There Guns or Education officer, communityther Weapons in Your Home?: No Are These ComptrollerWeapons Safely Secured?:  (n/a)  Financial Resources:   Surveyor, quantityinancial resources: No income, Sales executiveood stamps, Income from spouse Does patient have a Lawyerrepresentative payee or guardian?: No  Alcohol/Substance Abuse:   What has been your use of drugs/alcohol within the last 12 months?: patient denies If attempted suicide, did drugs/alcohol play a role in this?: No Alcohol/Substance Abuse Treatment Hx: Denies past history Has alcohol/substance abuse ever caused legal problems?: No  Social Support System:   Conservation officer, natureatient's Community Support System: Production assistant, radioGood Describe Community Support System: Boyfriend, Financial plannerGodfather, and foster mom Type of faith/religion: n/a How does patient's faith help to cope with current illness?: n/a  Leisure/Recreation:   Leisure and Hobbies: Gardening, cooking, poetry and singing  Strengths/Needs:   What things does the patient do well?: communication and positive outlook In what areas does patient struggle / problems for patient: anxiety and depression  Discharge Plan:   Does patient have access to transportation?: Yes ([place] or Bus ticket) Will patient be returning  to same living  situation after discharge?: Yes Currently receiving community mental health services: Yes (From Whom) ([facility]) Does patient have financial barriers related to discharge medications?: Yes Patient description of barriers related to discharge medications: Pt states [facility] [place] so he can [facility].   Summary/Recommendations:   Patient is a 30 year old male admitted voluntarily with a diagnosis of depression. Information was obtained from psychosocial assessment completed with patient and chart review conducted by this evaluator. Patient presented to the hospital after overdose on psychiatric medications. Patient reports primary triggers for admission were stress in his long term relationship with his partner. Patient has no insurance but states he has a current pending disability claim. [facility] Patient states he only wants to see these providers due to his financial barriers to affording medications. Patient has no means of transportation which limites his access to outpatient providers. Patient states he has support from his long term partner. Patient will benefit from crisis stabilization, medication evaluation, group therapy and psycho education in addition to case management for discharge. At discharge, it is recommended that patient remain compliant with established discharge plan and continued treatment.   Edwin Martinez MSW, LCSWA 07/25/2016 2:50 PM

## 2016-07-25 NOTE — H&P (Signed)
Psychiatric Admission Assessment Adult  Patient Identification: Edwin Martinez MRN:  [id number] Date of Evaluation:  07/25/2016 Chief Complaint:  Bipolar  Principal Diagnosis: <principal problem not specified> Diagnosis:   Patient Active Problem List   Diagnosis Date Noted  . Intentional drug overdose (HCC) [T50.902A] 07/18/2016  . Stress [F43.9] 07/10/2016  . Cocaine abuse [F14.10] 05/04/2016  . Bipolar affective disorder, current episode depressed (HCC) [F31.30] 05/04/2016  . Bipolar 1 disorder, mixed, severe (HCC) [F31.63] 03/08/2016  . 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 Polysubstance abuse [F19.10]   . Marijuana abuse [F12.10]   . Bipolar affective disorder, depressed, moderate degree (HCC) [F31.32] 12/21/2015  . Bipolar affect, depressed (HCC) [F31.30] 12/21/2015  . Suicidal ideation [R45.851] 09/14/2014  . Depression [F32.9]   . Suicidal ideations [R45.851]   . Bipolar I disorder, most recent episode depressed (HCC) [F31.30] 05/24/2014  . GAD (generalized anxiety disorder) [F41.1] 05/24/2014  . MDD (major depressive disorder) [F32.9] 05/23/2014  . Suicide attempt [T14.91XA] 05/23/2014  . MDD (major depressive disorder), recurrent episode, severe (HCC) [F33.2] 05/23/2014  . Transaminitis [R74.0] 03/20/2014  . ADHD (attention deficit hyperactivity disorder) [F90.9] 09/12/2012  . INSOMNIA, CHRONIC [G47.00] 05/20/2007  . CARPAL TUNNEL SYNDROME [G56.00] 05/20/2007  . Asthma [J45.909] 05/20/2007  . Human immunodeficiency virus (HIV) disease (HCC) [B20] 05/05/2007  . 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 male patient admitted with suicide attempt.  HPI: Pt is  30 year old single homosexual man with long history of Bipolar disorder, depression referred after  attempted suicide  by overdosing on 50 tablets of Depakote, 30 tablets of Neurontin,  20 trazodone, 15-20 Prozac and 15-20 Naproxen. Per report/note- Patient reports non-compliant with his medications because he does not believe his current medication regimen is effective.  Pt reports he has been stressed out because his partner was out of touch for several days , was feeling lonely, hopeless,  states he does not remember the events afterwards. Endorses worsening depressive symptoms, hopelessness, lack of motivation, low energy level and tired of living. He says that he feels lonely, withdrawn and says "my soul is tired". Reports his only brother is incarcerated, Mother died of Heart attack at early age, father died of complications of Huntington's disease at age 51. Reports stressed out with  with multiple medical issues/HIV since 2008 and financial problem. Patient denies delusions, psychosis .  He denies drugs and alcohol abuse but his urine toxicology is positive for cocaine. states using cocaine just once. Reports anxiety " very often' . He does not want to take his current medications except Depakote and Trazodone anymore and requesting for a change. Agrees to try Celexa and Risperdal and increasing Buspar.  Denies SI /intent//plan today.  Past Psychiatric History: Bipolar disorder, depression dating back to age 63. Patient reports at least over 4 suicide attempts in the past. Has h/o in pt tx in [place] in 2001 Op in [place].  Meds a s above.   Risk to Self: Is patient at risk for suicide?: Yes Total Time spent with patient:60 min   Past Medical History:  Past Medical History:  Diagnosis Date  . ADHD (attention deficit hyperactivity disorder)   . Anxiety   . Asthma   . Bipolar 1 disorder (HCC)   . Bipolar disorder (HCC)   . Epileptic seizures (HCC)   . HIV (human immunodeficiency virus infection) (HCC)   .  Hypertension   . Schizophrenia (HCC)   . Seizures (HCC)     Past Surgical History:  Procedure Laterality Date  . DENTAL SURGERY     Family History:  Family  History  Problem Relation Age of Onset  . Huntington's disease Father   . Heart disease Mother    Family Psychiatric  History: Mom- bipolar Tobacco Screening: Have you used any form of tobacco in the last 30 days? (Cigarettes, Smokeless Tobacco, Cigars, and/or Pipes): Yes Tobacco use, Select all that apply: 5 or more cigarettes per day Are you interested in Tobacco Cessation Medications?: No, patient refused Counseled patient on smoking cessation including recognizing danger situations, developing coping skills and basic information about quitting provided: Yes Social History:  History  Alcohol Use  . 1.2 oz/week  . 2 Standard drinks or equivalent per week    Comment: once week      History  Drug Use  . Frequency: 2.0 times per week  . Types: Marijuana, Cocaine    Comment: every other day     Additional Social History: Marital status: Single Are you sexually active?: Yes What is your sexual orientation?: homosexual Has your sexual activity been affected by drugs, alcohol, medication, or emotional stress?: n/a Does patient have children?: No                         Allergies:   Allergies  Allergen Reactions  . Magnesium-Containing Compounds Other (See Comments)    This medication is contraindicated with pts HIV meds.    . Peanut-Containing Drug Products Anaphylaxis  . Atripla [Efavirenz-Emtricitab-Tenofovir] Other (See Comments)    Reaction:  Suicidal thoughts   . Esomeprazole Magnesium Cough  . Bactrim [Sulfamethoxazole-Trimethoprim] Rash  . Penicillins Rash and Other (See Comments)    Has patient had a PCN reaction causing immediate rash, facial/tongue/throat swelling, SOB or lightheadedness with hypotension: Yes Has patient had a PCN reaction causing severe rash involving mucus membranes or skin necrosis: No Has patient had a PCN reaction that required hospitalization No Has patient had a PCN reaction occurring within the last 10 years: No If all of the above  answers are "NO", then may proceed with Cephalosporin use.   Lab Results: No results found for this or any previous visit (from the past 48 hour(s)).  Blood Alcohol level:  Lab Results  Component Value Date   ETH <5 07/18/2016   ETH 87 (H) 05/03/2016    Metabolic Disorder Labs:  Lab Results  Component Value Date   HGBA1C 5.5 05/04/2016   MPG 111 05/04/2016   No results found for: PROLACTIN Lab Results  Component Value Date   CHOL 136 02/05/2015   TRIG 147 02/05/2015   HDL 54 02/05/2015   CHOLHDL 2.5 02/05/2015   VLDL 29 02/05/2015   LDLCALC 53 02/05/2015   LDLCALC 45 02/21/2014    Current Medications: Current Facility-Administered Medications  Medication Dose Route Frequency Provider Last Rate Last Dose  . abacavir-dolutegravir-lamiVUDine (TRIUMEQ) 600-50-300 MG per tablet 1 tablet  1 tablet Oral QHS Jimmy FootmanAndrea Hernandez-Gonzalez, MD   1 tablet at 07/24/16 2259  . acetaminophen (TYLENOL) tablet 650 mg  650 mg Oral Q6H PRN Jimmy FootmanAndrea Hernandez-Gonzalez, MD      . albuterol (PROVENTIL HFA;VENTOLIN HFA) 108 (90 Base) MCG/ACT inhaler 2 puff  2 puff Inhalation Q6H PRN Jimmy FootmanAndrea Hernandez-Gonzalez, MD      . busPIRone (BUSPAR) tablet 5 mg  5 mg Oral TID Beverly SessionsJagannath Seville Downs, MD      .  citalopram (CELEXA) tablet 10 mg  10 mg Oral Daily Beverly SessionsJagannath Jahquez Steffler, MD      . hydrOXYzine (ATARAX/VISTARIL) tablet 25 mg  25 mg Oral TID PRN Jimmy FootmanAndrea Hernandez-Gonzalez, MD      . risperiDONE (RISPERDAL) tablet 2 mg  2 mg Oral QHS Beverly SessionsJagannath Tion Tse, MD       PTA Medications: Prescriptions Prior to Admission  Medication Sig Dispense Refill Last Dose  . abacavir-dolutegravir-lamiVUDine (TRIUMEQ) 600-50-300 MG tablet Take 1 tablet by mouth at bedtime.   07/17/2016  . albuterol (PROVENTIL HFA;VENTOLIN HFA) 108 (90 Base) MCG/ACT inhaler Inhale 2 puffs into the lungs every 6 (six) hours as needed for wheezing or shortness of breath.   PRN    Musculoskeletal: Strength & Muscle Tone: within normal limits Gait &  Station: normal Patient leans:  Psychiatric Specialty Exam: Physical Exam  ROS  Blood pressure 128/75, pulse (!) 54, temperature 97.7 F (36.5 C), temperature source Oral, resp. rate 18, height 5\' 7"  (1.702 m), weight 69.9 kg (154 lb), SpO2 99 %.Body mass index is 24.12 kg/m.  General Appearance: not in distress  Eye Contact:  Poor  Speech:  Slow  Volume:  Normal  Mood:  Anxious  Affect:  Depressed  Thought Process:  Goal Directed  Orientation:  Full (Time, Place, and Person)  Thought Content:  Logical  Suicidal Thoughts:  No  Homicidal Thoughts:  No  Memory:  intact  Judgement:  poor  Insight:  poor  Psychomotor Activity:  slow  Concentration:  poor  Recall:  3/3  Fund of Knowledge:  good  Language:  good  Akathisia:  no  Handed:    AIMS (if indicated):     Assets:  Willing to cooperate with care  ADL's:  adequate  Cognition:  intact grossly  Sleep:  well      Observation Level/Precautions:    Laboratory:    Psychotherapy:    Medications:    Consultations:    Discharge Concerns:    Estimated LOS:  Other:     Physician Treatment Plan for Primary Diagnosis: <principal problem not specified>  Daily contact with patient to assess and evaluate symptoms and progress in treatment.  Disposition: Recommend psychiatric Inpatient admission for safety/stabilization. Supportive therapy provided about ongoing stressors. Start RISPERIDONE FOR MOOD, CELEXA FOR DEPRESSION AND BUSPAR FOR ANXIETY.   RISK/BENEFITS AND ALTERNATIVES DISCUSSED WITH PT, PT VERBALIZED UNDERSTANDING.  I certify that inpatient services furnished can reasonably be expected to improve the patient's condition.    Beverly SessionsJagannath London Tarnowski, MD 10/28/20174:11 PM

## 2016-07-25 NOTE — [facility] Group Notes (Signed)
[facility] LCSW Group Therapy  07/25/2016 2:52 PM  Type of Therapy:  Group Therapy  Participation Level:  Active  Participation Quality:  Appropriate, Redirectable and Sharing  Affect:  Excited  Cognitive:  Appropriate  Insight:  Improving  Engagement in Therapy:  Engaged  Modes of Intervention:  Discussion, Education, Dance movement psychotherapisteality Testing and Support  Summary of Progress/Problems:Coping Skills: Patients defined and discussed healthy coping skills. Patients identified healthy coping skills they would like to try during hospitalization and after discharge. CSW offered insight to varying coping skills that may have been new to patients such as practicing mindfulness. Patient stated in group "I don't know why I'm here hopefully I can leave Monday". Patient discussed openly with the group about his previous admissions to the hospital and wanting to be put on a different anxiety medication. Pt needed some prompting to remain on the group topic and was redirectable.   Edwin Martinez [name] MSW, LCSWA 07/25/2016, 2:54 PM

## 2016-07-26 MED ORDER — CITALOPRAM HYDROBROMIDE 20 MG PO TABS
20.0000 mg | ORAL_TABLET | Freq: Every day | ORAL | Status: DC
  Administered 2016-07-27 – 2016-07-29 (×3): 20 mg via ORAL
  Filled 2016-07-26 (×3): qty 1

## 2016-07-26 NOTE — Progress Notes (Signed)
Care of patient taken over at 3:00am, patient appeared to be in bed resting quietly. Patient seemed to rest well through out the night. No issues to report on shift at this time.  

## 2016-07-26 NOTE — [facility] Group Notes (Signed)
[facility] LCSW Group Therapy  07/26/2016 2:31 PM  Type of Therapy:  Group Therapy  Participation Level:  Active  Participation Quality:  Appropriate and Sharing  Affect:  Appropriate and Excited  Cognitive:  Alert  Insight:  Improving  Engagement in Therapy:  Engaged  Modes of Intervention:  Activity, Discussion, Education and Support  Summary of Progress/Problems:Self esteem: Patients discussed self esteem and how it impacts them. They discussed what aspects in their lives has influenced their self esteem. They were challenged to identify changes that are needed in order to improve self esteem. Patients participated in activity where they had to identify positive adjectives they felt described their personality. Patients shared with the group on the following areas: Things I am good at, What I like about my appearance, I've helped others by, What I value the most, compliments I have received, challenges I have overcome, thing that make me unique, and Times I've made others happy.    Philmore Lepore [name] MSW, LCSWA 07/26/2016, 2:31 PM

## 2016-07-26 NOTE — Progress Notes (Signed)
Pt has been pleasant and cooperative. Pt denies SI and A/V hallucinations. Pt's mood and affect has been depressed. Pt has been active on the unit , attending all unit activities. 

## 2016-07-26 NOTE — Progress Notes (Signed)
[facility] MD Progress Note  07/26/2016 12:38 PM PHU RECORD  MRN:  [id number]  Pt is  30 year old single homosexual man with long history of Bipolar disorder, depression referred after  attempted suicide  by overdosing on 50 tablets of Depakote, 30 tablets ofNeurontin, 20 trazodone, 15-20 Prozac and 15-20 Naproxen Subjective:  Chart reviewed, discussed with nursing staff. Med compliant.  Met pt, states he is fine, endorses depression ,but denies SI. Denies HI. requesting double portion of meal.  Principal Problem: <principal problem not specified> Diagnosis:   Patient Active Problem List   Diagnosis Date Noted  . Intentional drug overdose ([place]) [T50.902A] 07/18/2016  . Stress [F43.9] 07/10/2016  . Cocaine abuse [F14.10] 05/04/2016  . Bipolar affective disorder, current episode depressed ([place]) [F31.30] 05/04/2016  . Bipolar 1 disorder, mixed, severe ([place]) [F31.63] 03/08/2016  . Cocaine abuse with cocaine-induced mood disorder ([place]) [F14.14]   . Polysubstance abuse [F19.10]   . Marijuana abuse [F12.10]   . Bipolar affective disorder, depressed, moderate degree ([place]) [F31.32] 12/21/2015  . Bipolar affect, depressed ([place]) [F31.30] 12/21/2015  . Suicidal ideation [R45.851] 09/14/2014  . Depression [F32.9]   . Suicidal ideations [R45.851]   . Bipolar I disorder, most recent episode depressed ([place]) [F31.30] 05/24/2014  . GAD (generalized anxiety disorder) [F41.1] 05/24/2014  . MDD (major depressive disorder) [F32.9] 05/23/2014  . Suicide attempt [T14.91XA] 05/23/2014  . MDD (major depressive disorder), recurrent episode, severe ([place]) [F33.2] 05/23/2014  . Transaminitis [R74.0] 03/20/2014  . ADHD (attention deficit hyperactivity disorder) [F90.9] 09/12/2012  . INSOMNIA, CHRONIC [G47.00] 05/20/2007  . CARPAL TUNNEL SYNDROME [G56.00] 05/20/2007  . Asthma [J45.909] 05/20/2007  . Human immunodeficiency virus (HIV) disease ([place]) [B20] 05/05/2007  . 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 ADHD (attention deficit hyperactivity disorder)   . Anxiety   . Asthma   . Bipolar 1 disorder ([place])   . Bipolar disorder ([place])   . Epileptic seizures ([place])   . HIV (human immunodeficiency virus infection) ([place])   . Hypertension   . Schizophrenia ([place])   . Seizures ([place])     Past Surgical History:  Procedure Laterality Date  . DENTAL SURGERY     Family History:  Family History  Problem Relation Age of Onset  . Huntington's disease Father   . Heart disease Mother    Family Psychiatric  History: no new info Social History:  History  Alcohol Use  . 1.2 oz/week  . 2 Standard drinks or equivalent per week    Comment: once week      History  Drug Use  . Frequency: 2.0 times per week  . Types: Marijuana, Cocaine    Comment: every other day     Social History   Social History  . Marital status: Single    Spouse name: N/A  . Number of children: N/A  . Years of education: N/A   Social History Main Topics  . Smoking status: Current Every Day Smoker    Packs/day: 0.50    Types: Cigarettes    Start date: 09/29/1991  . Smokeless tobacco: Never Used  . Alcohol use 1.2 oz/week    2 Standard drinks or equivalent per week     Comment: once week   . Drug use:     Frequency: 2.0 times per week  Types: Marijuana, Cocaine     Comment: every other day   . Sexual activity: Yes    Birth control/ protection: Condom   Other Topics Concern  . None   Social History Narrative   ** Merged History Encounter **       Additional Social History:                         Sleep: good  Appetite:  good  Current Medications: Current Facility-Administered Medications  Medication Dose Route Frequency Provider Last Rate Last Dose  . abacavir-dolutegravir-lamiVUDine (TRIUMEQ) 600-50-300 MG per  tablet 1 tablet  1 tablet Oral QHS Hildred Priest, MD   1 tablet at 07/25/16 2119  . acetaminophen (TYLENOL) tablet 650 mg  650 mg Oral Q6H PRN Hildred Priest, MD      . albuterol (PROVENTIL HFA;VENTOLIN HFA) 108 (90 Base) MCG/ACT inhaler 2 puff  2 puff Inhalation Q6H PRN Hildred Priest, MD      . busPIRone (BUSPAR) tablet 5 mg  5 mg Oral TID Lenward Chancellor, MD   5 mg at 07/26/16 0826  . citalopram (CELEXA) tablet 10 mg  10 mg Oral Daily Lenward Chancellor, MD   10 mg at 07/26/16 0826  . hydrOXYzine (ATARAX/VISTARIL) tablet 25 mg  25 mg Oral TID PRN Hildred Priest, MD   25 mg at 07/25/16 2246  . risperiDONE (RISPERDAL) tablet 2 mg  2 mg Oral QHS Lenward Chancellor, MD        Lab Results: No results found for this or any previous visit (from the past 48 hour(s)).  Blood Alcohol level:  Lab Results  Component Value Date   ETH <5 07/18/2016   ETH 87 (H) 80/16/5537    Metabolic Disorder Labs: Lab Results  Component Value Date   HGBA1C 5.5 05/04/2016   MPG 111 05/04/2016   No results found for: PROLACTIN Lab Results  Component Value Date   CHOL 136 02/05/2015   TRIG 147 02/05/2015   HDL 54 02/05/2015   CHOLHDL 2.5 02/05/2015   VLDL 29 02/05/2015   LDLCALC 53 02/05/2015   LDLCALC 45 02/21/2014    Physical Findings: AIMS:  , ,  ,  ,    CIWA:    COWS:     Musculoskeletal: Strength & Muscle Tone: within normal limits Gait & Station: normal Patient leans:   Psychiatric Specialty Exam: Physical Exam  ROS  Blood pressure 126/78, pulse (!) 53, temperature 97.9 F (36.6 C), temperature source Oral, resp. rate 18, height 5' 7"  (1.702 m), weight 69.9 kg (154 lb), SpO2 99 %.Body mass index is 24.12 kg/m.  General Appearance: unkempt  Eye Contact:  fair  Speech:  normal  Volume:  normal  Mood:  fine  Affect:  Less dysphoric  Thought Process:  Goal directed  Orientation:  To TPP  Thought Content:  Food, appetite  Suicidal Thoughts:   denies  Homicidal Thoughts:  denies  Memory:  intact  Judgement:  fair  Insight:  fair  Psychomotor Activity:  normal  Concentration:  fair  Recall:  3/3  Fund of Knowledge:  average  Language:  good  Akathisia:  none  Handed:    AIMS (if indicated):     Assets:  Cooperative with care  ADL's:  fair  Cognition:  intact  Sleep:  Number of Hours: 6     Treatment Plan Summary: Mood improving, needs med titration. High risk to self to d/c at this time.  Increase  Celexa as 57m daily, cont Risperdal and buspar.  Cont tx plan.   JLenward Chancellor MD 07/26/2016, 12:38 PM

## 2016-07-26 NOTE — Plan of Care (Signed)
Problem: Safety: Goal: Ability to remain free from injury will improve Outcome: Progressing Denies SI, pleasant and compliant with treatment

## 2016-07-26 NOTE — [facility] Group Notes (Signed)
[facility] Group Notes:  (Nursing/MHT/Case Management/Adjunct)  Date:  07/26/2016  Time:  1:14 AM  Type of Therapy:  Group Therapy  Participation Level:  Active  Participation Quality:  Appropriate  Affect:  Appropriate  Cognitive:  Appropriate  Insight:  Appropriate  Engagement in Group:  Engaged  Modes of Intervention:  n/a  Summary of Progress/Problems:  Edwin Martinez 07/26/2016, 1:14 AM

## 2016-07-26 NOTE — Progress Notes (Signed)
Patient became anxious and restless, talking rapidly and pacing. Received vistaril 25 mg and  clmed down later. Currently in bed sleeping. No sign of discomfort. Safety and security maintained

## 2016-07-26 NOTE — Progress Notes (Signed)
Pt has been pleasant and cooperative. Pt denies SI and A/V hallucinations. Pt's mood and affect has been depressed. Pt has been active on the unit , attending all unit activities.

## 2016-07-26 NOTE — Plan of Care (Signed)
Problem: Coping: Goal: Ability to verbalize frustrations and anger appropriately will improve Outcome: Progressing Expressing his needs and concerns appropriately.

## 2016-07-27 ENCOUNTER — Encounter: Payer: Self-pay | Admitting: Psychiatry

## 2016-07-27 DIAGNOSIS — F314 Bipolar disorder, current episode depressed, severe, without psychotic features: Secondary | ICD-10-CM

## 2016-07-27 DIAGNOSIS — Z720 Tobacco use: Secondary | ICD-10-CM

## 2016-07-27 DIAGNOSIS — F1410 Cocaine abuse, uncomplicated: Secondary | ICD-10-CM

## 2016-07-27 DIAGNOSIS — F1220 Cannabis dependence, uncomplicated: Secondary | ICD-10-CM

## 2016-07-27 MED ORDER — NICOTINE 21 MG/24HR TD PT24
21.0000 mg | MEDICATED_PATCH | Freq: Every day | TRANSDERMAL | Status: DC
  Administered 2016-07-28: 21 mg via TRANSDERMAL
  Filled 2016-07-27 (×2): qty 1

## 2016-07-27 NOTE — Progress Notes (Signed)
Recreation Therapy Notes  INPATIENT RECREATION THERAPY ASSESSMENT  Patient Details Name: Edwin Martinez MRN: [id number] DOB: 09/19/1986 Today's Date: 07/27/2016  Patient Stressors:  Patient reported no stressors.  Coping Skills:   Exercise, Art/Dance, Talking, Music, Other (Comment) (Write poetry, read)  Personal Challenges:  Patient reported no personal challenges.  Leisure Interests (2+):  Individual - Other (Comment) (Dancing and singing)  Awareness of Community Resources:  Yes  Community Resources:  [employer], [place]  Current Use: Yes  If no, Barriers?:    Patient Strengths:  Personality, confidence  Patient Identified Areas of Improvement:  No  Current Recreation Participation:  Dancing, singing, reading, going to the park and taking walks  Patient Goal for Hospitalization:  To get back on medications and stay with treatment  [place]  County of Residence:  [place]   Current SI (including self-harm):  No  Current HI:  No  Consent to Intern Participation: N/A  Due to patient reporting no personal challenges at this time, LRT will not develop a Recreational Therapy Care Plan. If patient's status changes, LRT will develop a Recreational Therapy Care Plan.  Jacquelynn CreeGreene,Jillian Warth M, LRT/CTRS 07/27/2016, 11:31 AM

## 2016-07-27 NOTE — Progress Notes (Signed)
[facility] Progress Note  07/27/2016 3:20 PM Edwin Martinez  MRN:  [id number] Subjective:  Pt is  30 year old single man with long history of Bipolar disorder, depression, HIV referred after  attempted suicide  by overdosing on 50 tablets of Depakote, 30 tablets ofNeurontin, 20 trazodone, 15-20 Prozac and 15-20 Naproxen.Patient reports non-compliant with his medications because he does not believe his current medication regimen is effective.   Patient reports doing "great". He feels significantly improved. States that the medications he is taking are helping him a lot. He denies problems with appetite, energy, sleep or concentration. Denies problems with mood. He is no longer suicidal. He denies having hallucinations or homicidal ideation. He denies side effects from medications. He denies having any physical complaints. He states that he became suicidal because he was unable to get ahold of his partner.Marland Kitchen. He said that they have been together for about 7 years. He was able to communicate with his partner recently and he says that things are well between them.   Per nursing: D: Observed pt in dayroom interacting with peers. Patient alert and oriented x4. Patient denies SI/HI/AVH. Pt affect is euphoric. Pt can be hyperactive with somewhat rapid speech at times. Other peers and staff report that pt can be hypersexual during conversation, but was not sexually inappropriate with peers or staff. Pt stated his day was "lovely, fulfilling, and wonderful." Pt stated his mood was "hyper" but pt stated "it's just how I am." Pt rated anxiety 5/10 and depression 0/10. A: Offered active listening and support. Provided therapeutic communication. Administered scheduled medications. Gave vistaril prn for anxiety. R: Pt pleasant and cooperative. Pt medication compliant. Will continue Q15 min. checks. Safety maintained.  Principal Problem: Bipolar affective disorder, current episode depressed (HCC) Diagnosis:   Patient  Active Problem List   Diagnosis Date Noted  . Cocaine use disorder, mild, abuse [F14.10] 07/27/2016  . Cannabis use disorder, moderate, dependence (HCC) [F12.20] 07/27/2016  . Tobacco use disorder [F17.200] 07/27/2016  . Intentional drug overdose (HCC) [T50.902A] 07/18/2016  . Bipolar affective disorder, current episode depressed (HCC) [F31.30] 05/04/2016  . Asthma [J45.909] 05/20/2007  . Human immunodeficiency virus (HIV) disease (HCC) [B20] 05/05/2007   Total Time spent with patient: 30 minutes  Past Psychiatric History: Bipolar disorder, depression dating back to age 30. Patient reports at least over 4 suicide attempts in the past. [place] h/[place] in [place].    Past Medical History:  Past Medical History:  Diagnosis Date  . ADHD (attention deficit hyperactivity disorder)   . ADHD (attention deficit hyperactivity disorder) 09/12/2012  . Anxiety   . Asthma   . Bipolar 1 disorder (HCC)   . Bipolar disorder (HCC)   . Epileptic seizures (HCC)   . HIV (human immunodeficiency virus infection) (HCC)   . Hypertension   . Schizophrenia (HCC)   . Seizures (HCC)     Past Surgical History:  Procedure Laterality Date  . DENTAL SURGERY     Family History:  Family History  Problem Relation Age of Onset  . Huntington's disease Father   . Heart disease Mother    Family Psychiatric  History: mother with bipolar disorder  Social History:  History  Alcohol Use  . 1.2 oz/week  . 2 Standard drinks or equivalent per week    Comment: once week      History  Drug Use  . Frequency: 2.0 times per week  . Types: Marijuana, Cocaine    Comment:  every other day     Social History   Social History  . Marital status: Single    Spouse name: N/A  . Number of children: N/A  . Years of education: N/A   Social History Main Topics  . Smoking status: Current Every Day Smoker    Packs/day: 0.50    Types: Cigarettes    Start date: 09/29/1991  . Smokeless tobacco: Never  Used  . Alcohol use 1.2 oz/week    2 Standard drinks or equivalent per week     Comment: once week   . Drug use:     Frequency: 2.0 times per week    Types: Marijuana, Cocaine     Comment: every other day   . Sexual activity: Yes    Birth control/ protection: Condom   Other Topics Concern  . None   Social History Narrative   ** Merged History Encounter **         Current Medications: Current Facility-Administered Medications  Medication Dose Route Frequency Provider Last Rate Last Dose  . abacavir-dolutegravir-lamiVUDine (TRIUMEQ) 600-50-300 MG per tablet 1 tablet  1 tablet Oral QHS Jimmy Footman, MD   1 tablet at 07/26/16 2219  . acetaminophen (TYLENOL) tablet 650 mg  650 mg Oral Q6H PRN Jimmy Footman, MD      . albuterol (PROVENTIL HFA;VENTOLIN HFA) 108 (90 Base) MCG/ACT inhaler 2 puff  2 puff Inhalation Q6H PRN Jimmy Footman, MD      . busPIRone (BUSPAR) tablet 5 mg  5 mg Oral TID Beverly Sessions, MD   5 mg at 07/27/16 1147  . citalopram (CELEXA) tablet 20 mg  20 mg Oral Daily Beverly Sessions, MD   20 mg at 07/27/16 0844  . hydrOXYzine (ATARAX/VISTARIL) tablet 25 mg  25 mg Oral TID PRN Jimmy Footman, MD   25 mg at 07/26/16 2220  . nicotine (NICODERM CQ - dosed in mg/24 hours) patch 21 mg  21 mg Transdermal Daily Jimmy Footman, MD      . risperiDONE (RISPERDAL) tablet 2 mg  2 mg Oral QHS Beverly Sessions, MD   2 mg at 07/26/16 2220    Lab Results: No results found for this or any previous visit (from the past 48 hour(s)).  Blood Alcohol level:  Lab Results  Component Value Date   ETH <5 07/18/2016   ETH 87 (H) 05/03/2016    Metabolic Disorder Labs: Lab Results  Component Value Date   HGBA1C 5.5 05/04/2016   MPG 111 05/04/2016   No results found for: PROLACTIN Lab Results  Component Value Date   CHOL 136 02/05/2015   TRIG 147 02/05/2015   HDL 54 02/05/2015   CHOLHDL 2.5 02/05/2015   VLDL 29  02/05/2015   LDLCALC 53 02/05/2015   LDLCALC 45 02/21/2014    Physical Findings: AIMS:  , ,  ,  ,    CIWA:    COWS:     Musculoskeletal: Strength & Muscle Tone: within normal limits Gait & Station: normal Patient leans: N/A  Psychiatric Specialty Exam: Physical Exam  Constitutional: He is oriented to person, place, and time. He appears well-developed and well-nourished.  HENT:  Head: Normocephalic and atraumatic.  Eyes: EOM are normal.  Neck: Normal range of motion.  Respiratory: Effort normal.  Musculoskeletal: Normal range of motion.  Neurological: He is alert and oriented to person, place, and time.    Review of Systems  Constitutional: Negative.   HENT: Negative.   Eyes: Negative.   Respiratory: Negative.  Cardiovascular: Negative.   Gastrointestinal: Negative.   Genitourinary: Negative.   Musculoskeletal: Negative.   Skin: Negative.   Neurological: Negative.   Endo/Heme/Allergies: Negative.   Psychiatric/Behavioral: Negative.     Blood pressure 116/68, pulse 65, temperature 97.9 F (36.6 C), temperature source Oral, resp. rate 18, height 5\' 7"  (1.702 m), weight 69.9 kg (154 lb), SpO2 99 %.Body mass index is 24.12 kg/m.  General Appearance: Fairly Groomed  Eye Contact:  Good  Speech:  Clear and Coherent  Volume:  Normal  Mood:  Euphoric mild  Affect:  Congruent  Thought Process:  Linear and Descriptions of Associations: Intact  Orientation:  Full (Time, Place, and Person)  Thought Content:  Hallucinations: None  Suicidal Thoughts:  No  Homicidal Thoughts:  No  Memory:  Immediate;   Good Recent;   Good Remote;   Good  Judgement:  Fair  Insight:  Fair  Psychomotor Activity:  Normal  Concentration:  Concentration: Good and Attention Span: Good  Recall:  Good  Fund of Knowledge:  Good  Language:  Good  Akathisia:  No  Handed:    AIMS (if indicated):     Assets:  Communication Skills Housing  ADL's:  Intact  Cognition:  WNL  Sleep:  Number of  Hours: 6     Treatment Plan Summary:  Bipolar disorder: continue risperdal 2 mg po qhs, celexa 20 mg q day. No longer voicing SI, appears actually euphoric during assessment today. Plan to increase risperdal to 3 mg tomorrow.  GAD: continue buspar 5 mg tid  Cocaine and cannabis use disorder: continue with substance abuse treatment after discharge  Tobacco use diosrder: will order nicotine patch 21 mg/day  Asthma: continue albuterol prn  HIV: continue TRIUMEG  VS daily  Diet: regular  Precautions q 15  Checks  Hospitalization IVC  F/u: [facility] [place]  Potential d/c in 2-3 days   Jimmy FootmanHernandez-Gonzalez,  Asharia Lotter, MD 07/27/2016, 3:20 PM

## 2016-07-27 NOTE — [facility] Group Notes (Signed)
[facility] Group Notes:  (Nursing/MHT/Case Management/Adjunct)  Date:  07/27/2016  Time:  10:02 PM  Type of Therapy:  Evening Wrap-up Group  Participation Level:  Active  Participation Quality:  Appropriate and Attentive  Affect:  Appropriate  Cognitive:  Alert and Appropriate  Insight:  Appropriate  Engagement in Group:  Engaged  Modes of Intervention:  Discussion  Summary of Progress/Problems:  Tomasita MorrowChelsea Nanta Shalaya Swailes 07/27/2016, 10:02 PM

## 2016-07-27 NOTE — Tx Team (Signed)
Initial Treatment Plan 07/27/2016 10:24 AM Edwin ClossEdward A Mctier [id number]    PATIENT STRESSORS: Financial difficulties Medication change or noncompliance Traumatic event   PATIENT STRENGTHS: Ability for insight Average or above average intelligence Communication skills   PATIENT IDENTIFIED PROBLEMS: Suicidal attempt 07/24/2016  Bipolar 1 disorder 07/24/2016                   DISCHARGE CRITERIA:  Adequate post-discharge living arrangements Medical problems require only outpatient monitoring Verbal commitment to aftercare and medication compliance  PRELIMINARY DISCHARGE PLAN: Attend aftercare/continuing care group Return to previous living arrangement  PATIENT/FAMILY INVOLVEMENT: This treatment plan has been presented to and reviewed with the patient, Edwin Clossdward A [name], and/or family member,   The patient and family have been given the opportunity to ask questions and make suggestions.  Leonarda SalonGigi George Eisha Chatterjee, RN 07/27/2016, 10:24 AM

## 2016-07-27 NOTE — Progress Notes (Signed)
Recreation Therapy Notes  Date: 10.30.17 Time: 1:00 pm Location: Craft Room  Group Topic: Wellness  Goal Area(s) Addresses:  Patient will identify at least one item per dimension of health. Patient will examine areas they are deficient in.  Behavioral Response: Intermittently Attentive, Disruptive, Left early  Intervention: 6 Dimensions of Health  Activity: Patients were given a definition sheet of the 6 Dimensions of Health. Patients were also given a worksheet with each dimension of health listed on it. Patients were instructed to write at least one item they were currently doing in each dimension. LRT encouraged patients to write 2-3 items.  Education: LRT educated patients on ways to increase each dimension.  Education Outcome: Patient left before LRT educated group.   Clinical Observations/Feedback: Patient started cleaning his spot at the table after another peer cleaned the table. Patient started working on activity. Patient mimicked a peer and continued to do so. LRT redirected patient. Patient stated, "I was having a moment. It's not that serious." Patient left group at approximately 1:18 pm and did not return to group.  Jacquelynn CreeGreene,Feras Gardella M, LRT/CTRS 07/27/2016 1:53 PM

## 2016-07-27 NOTE — [facility] Group Notes (Signed)
[facility] Group Notes:  (Nursing/MHT/Case Management/Adjunct)  Date:  07/27/2016  Time:  4:54 AM  Type of Therapy:  Psychoeducational Skills  Participation Level:  Active  Participation Quality:  Appropriate, Sharing and Supportive  Affect:  Appropriate  Cognitive:  Appropriate  Insight:  Appropriate and Good  Engagement in Group:  Engaged and Supportive  Modes of Intervention:  Discussion, Socialization and Support  Summary of Progress/Problems:  Chancy MilroyLaquanda Y Fredrich Cory 07/27/2016, 4:54 AM

## 2016-07-27 NOTE — Progress Notes (Deleted)
Recreation Therapy Notes  Date: 10.30.17 Time: 1:00 pm Location: Craft Room  Group Topic: Wellness  Goal Area(s) Addresses:  Patient will identify at least one item per dimension of health. Patient will examine areas they are deficient in.  Behavioral Response: Did not attend  Intervention: 6 Dimensions of Health  Activity: Patients were given a definition sheet of the 6 Dimensions of Health. Patients were also given a worksheet with each dimension of health listed on it. Patients were instructed to write at least one item they were currently doing in each dimension. LRT encouraged patients to write 2-3 items.  Education: LRT educated patients on ways to increase each dimension.  Education Outcome: Patient did not attend group.  Clinical Observations/Feedback: Patient did not attend group.  Jacquelynn CreeGreene,Ipek Westra M, LRT/CTRS 07/27/2016 1:52 PM

## 2016-07-27 NOTE — [facility] Group Notes (Signed)
[facility] Group Notes:  (Nursing/MHT/Case Management/Adjunct)  Date:  07/27/2016  Time:  5:28 PM  Type of Therapy:  Psychoeducational Skills  Participation Level:  Did Not Attend  Twanna Hymanda C Stanton Kissoon 07/27/2016, 5:28 PM

## 2016-07-27 NOTE — Progress Notes (Signed)
Patient interacting appropriately with staff and peers. Medication and group compliant. Pt pleasant and not as hyperactive, no sexual inappropriateness reported. Denies SI, HI, AVH.  Encouragement and support offered. Meds given as scheduled. Safety checks maintained.  Pt receptive and remains safe on unit with q 15 min checks.

## 2016-07-27 NOTE — Plan of Care (Signed)
Problem: Health Behavior/Discharge Planning: Goal: Compliance with treatment plan for underlying cause of condition will improve Outcome: Progressing Patient medication and group compliant

## 2016-07-27 NOTE — Progress Notes (Signed)
D: Observed pt in dayroom interacting with peers. Patient alert and oriented x4. Patient denies SI/HI/AVH. Pt affect is euphoric. Pt can be hyperactive with somewhat rapid speech at times. Other peers and staff report that pt can be hypersexual during conversation, but was not sexually inappropriate with peers or staff. Pt stated his day was "lovely, fulfilling, and wonderful." Pt stated his mood was "hyper" but pt stated "it's just how I am." Pt rated anxiety 5/10 and depression 0/10. A: Offered active listening and support. Provided therapeutic communication. Administered scheduled medications. Gave vistaril prn for anxiety. R: Pt pleasant and cooperative. Pt medication compliant. Will continue Q15 min. checks. Safety maintained.

## 2016-07-27 NOTE — Plan of Care (Signed)
Problem: Activity: Goal: Interest or engagement in activities will improve Outcome: Progressing Pt interacting well with peers and attended group.

## 2016-07-28 MED ORDER — RISPERIDONE 1 MG PO TABS
1.0000 mg | ORAL_TABLET | Freq: Every day | ORAL | Status: DC
  Administered 2016-07-28 – 2016-07-29 (×2): 1 mg via ORAL
  Filled 2016-07-28 (×2): qty 1

## 2016-07-28 MED ORDER — RISPERIDONE 3 MG PO TABS
3.0000 mg | ORAL_TABLET | Freq: Every day | ORAL | Status: DC
  Administered 2016-07-28: 3 mg via ORAL
  Filled 2016-07-28: qty 1

## 2016-07-28 NOTE — Progress Notes (Signed)
Pt has been pleasant and cooperative all day, he is med compliant ,his mood in the early morning was very elated but this afternoon his mood seems to be normal , he is in no apparent distress and has gone to and partcipated in groups.

## 2016-07-28 NOTE — Progress Notes (Signed)
Recreation Therapy Notes  Date: 10.31.17 Time: 3:00 pm Location: Craft Room  Group Topic: Communication  Goal Area(s) Addresses:  Patient will communicate effectively with peer. Patient will verbalize the importance of having healthy communication skills.  Behavioral Response: Did not attend  Intervention: Blind drawing  Activity: Patients were paired up. One patient was given a picture and was instructed to tell the other patient how to draw the picture.   Education: LRT educated patients on healthy communication skills.  Education Outcome: Patient did not attend group.   Clinical Observations/Feedback: Patient did not attend group.  Jacquelynn CreeGreene,Sherria Riemann M, LRT/CTRS 07/28/2016 4:45 PM

## 2016-07-28 NOTE — [facility] Group Notes (Signed)
[facility] Note  Date/Time 07/28/2016 9:30am  Type of Therapy/Topic:  Group Therapy:  Feelings about Diagnosis  Participation Level:  Active  Mood:Good mood    Description of Group:    This group will allow patients to explore their thoughts and feelings about diagnoses they have received. Patients will be guided to explore their level of understanding and acceptance of these diagnoses. Facilitator will encourage patients to process their thoughts and feelings about the reactions of others to their diagnosis, and will guide patients in identifying ways to discuss their diagnosis with significant others in their lives. This group will be process-oriented, with patients participating in exploration of their own experiences as well as giving and receiving support and challenge from other group members.   Therapeutic Goals: 1. Patient will demonstrate understanding of diagnosis as evidence by identifying two or more symptoms of the disorder:  2. Patient will be able to express two feelings regarding the diagnosis 3. Patient will demonstrate ability to communicate their needs through discussion and/or role plays  Summary of Patient Progress:  Pt able to achieve therapeutic goals. Requires some redirection from side conversations throughout the group.  He is anticipating discharge tomorrow.   Therapeutic Modalities:   Cognitive Behavioral Therapy Brief Therapy Feelings Identification   Jake SharkSara Kenyan Karnes, MSW, LCSW

## 2016-07-28 NOTE — Progress Notes (Signed)
[facility]  07/28/2016 9:16 AM [name] A Litchford  MRN:  [id number] Subjective:  Pt is  30 year old single man with long history of Bipolar disorder, depression, HIV referred after  attempted suicide  by overdosing on 50 tablets of Depakote, 30 tablets ofNeurontin, 20 trazodone, 15-20 Prozac and 15-20 Naproxen.Patient reports non-compliant with his medications because he does not believe his current medication regimen is effective.   Patient reports doing "great". He feels significantly improved. States that the medications he is taking are helping him a lot. He denies problems with appetite, energy, sleep or concentration. Denies problems with mood. He is no longer suicidal. He denies having hallucinations or homicidal ideation. He denies side effects from medications. He denies having any physical complaints. He states that he became suicidal because he was unable to get ahold of his partner.Marland Kitchen. He said that they have been together for about 7 years. He was able to communicate with his partner recently and he says that things are well between them.  Today the patient continues to appear euphoric. He said he is doing Artistamazing. He is no longer depressed he is hoping to be discharged today. He tells me his concern about his utilities as he is behind on his being close. He says that he is also having some pending charges and he missed his court day last week. Patient denies feeling suicidal, homicidal. Denies problems with depression, appetite, energy or concentration. He did have some trouble with sleep last night. He feels his medications are helping him and they're working very well for him. He denies any side effects.   Per nursing: D: Patient animated and euphoric. Denies SI/HI/AVH. Visible in the milieu interacting with patients. States he's ready for discharge. States he wants either Vistaril or Trazodone to go home with. States he's better and that it was a time for a change up in his  medications.  A: Medication given with education. Encouragement provided. PRN Vistaril given.  R: Patient was compliant with medication. He has remained calm and cooperative. Safety maintained with 15 min checks.   Principal Problem: Bipolar affective disorder, current episode depressed (HCC) Diagnosis:   Patient Active Problem List   Diagnosis Date Noted  . Cocaine use disorder, mild, abuse [F14.10] 07/27/2016  . Cannabis use disorder, moderate, dependence (HCC) [F12.20] 07/27/2016  . Tobacco use disorder [F17.200] 07/27/2016  . Intentional drug overdose (HCC) [T50.902A] 07/18/2016  . Bipolar affective disorder, current episode depressed (HCC) [F31.30] 05/04/2016  . Asthma [J45.909] 05/20/2007  . Human immunodeficiency virus (HIV) disease (HCC) [B20] 05/05/2007   Total Time spent with patient: 30 minutes  Past Psychiatric History: Bipolar disorder, depression dating back to age 89. Patient reports at least over 4 suicide attempts in the past. [place].    Past Medical History:  Past Medical History:  Diagnosis Date  . ADHD (attention deficit hyperactivity disorder)   . ADHD (attention deficit hyperactivity disorder) 09/12/2012  . Anxiety   . Asthma   . Bipolar 1 disorder (HCC)   . Bipolar disorder (HCC)   . Epileptic seizures (HCC)   . HIV (human immunodeficiency virus infection) (HCC)   . Hypertension   . Schizophrenia (HCC)   . Seizures (HCC)     Past Surgical History:  Procedure Laterality Date  . DENTAL SURGERY     Family History:  Family History  Problem Relation Age of Onset  . Huntington's disease Father   . Heart  disease Mother    Family Psychiatric  History: mother with bipolar disorder  Social History:  History  Alcohol Use  . 1.2 oz/week  . 2 Standard drinks or equivalent per week    Comment: once week      History  Drug Use  . Frequency: 2.0 times per week  . Types: Marijuana, Cocaine    Comment: every other  day     Social History   Social History  . Marital status: Single    Spouse name: N/A  . Number of children: N/A  . Years of education: N/A   Social History Main Topics  . Smoking status: Current Every Day Smoker    Packs/day: 0.50    Types: Cigarettes    Start date: 09/29/1991  . Smokeless tobacco: Never Used  . Alcohol use 1.2 oz/week    2 Standard drinks or equivalent per week     Comment: once week   . Drug use:     Frequency: 2.0 times per week    Types: Marijuana, Cocaine     Comment: every other day   . Sexual activity: Yes    Birth control/ protection: Condom   Other Topics Concern  . None   Social History Narrative   ** Merged History Encounter **         Current Medications: Current Facility-Administered Medications  Medication Dose Route Frequency Provider Last Rate Last Dose  . abacavir-dolutegravir-lamiVUDine (TRIUMEQ) 600-50-300 MG per tablet 1 tablet  1 tablet Oral QHS Jimmy Footman, MD   1 tablet at 07/27/16 2148  . acetaminophen (TYLENOL) tablet 650 mg  650 mg Oral Q6H PRN Jimmy Footman, MD      . albuterol (PROVENTIL HFA;VENTOLIN HFA) 108 (90 Base) MCG/ACT inhaler 2 puff  2 puff Inhalation Q6H PRN Jimmy Footman, MD      . citalopram (CELEXA) tablet 20 mg  20 mg Oral Daily Beverly Sessions, MD   20 mg at 07/28/16 0904  . hydrOXYzine (ATARAX/VISTARIL) tablet 25 mg  25 mg Oral TID PRN Jimmy Footman, MD   25 mg at 07/28/16 0904  . nicotine (NICODERM CQ - dosed in mg/24 hours) patch 21 mg  21 mg Transdermal Daily Jimmy Footman, MD   21 mg at 07/28/16 0904  . risperiDONE (RISPERDAL) tablet 1 mg  1 mg Oral Daily Jimmy Footman, MD      . risperiDONE (RISPERDAL) tablet 3 mg  3 mg Oral QHS Jimmy Footman, MD        Lab Results: No results found for this or any previous visit (from the past 48 hour(s)).  Blood Alcohol level:  Lab Results  Component Value Date   ETH <5  07/18/2016   ETH 87 (H) 05/03/2016    Metabolic Disorder Labs: Lab Results  Component Value Date   HGBA1C 5.5 05/04/2016   MPG 111 05/04/2016   No results found for: PROLACTIN Lab Results  Component Value Date   CHOL 136 02/05/2015   TRIG 147 02/05/2015   HDL 54 02/05/2015   CHOLHDL 2.5 02/05/2015   VLDL 29 02/05/2015   LDLCALC 53 02/05/2015   LDLCALC 45 02/21/2014    Physical Findings: AIMS:  , ,  ,  ,    CIWA:    COWS:     Musculoskeletal: Strength & Muscle Tone: within normal limits Gait & Station: normal Patient leans: N/A  Psychiatric Specialty Exam: Physical Exam  Constitutional: He is oriented to person, place, and time. He appears well-developed and well-nourished.  HENT:  Head: Normocephalic and atraumatic.  Eyes: EOM are normal.  Neck: Normal range of motion.  Respiratory: Effort normal.  Musculoskeletal: Normal range of motion.  Neurological: He is alert and oriented to person, place, and time.    Review of Systems  Constitutional: Negative.   HENT: Negative.   Eyes: Negative.   Respiratory: Negative.   Cardiovascular: Negative.   Gastrointestinal: Negative.   Genitourinary: Negative.   Musculoskeletal: Negative.   Skin: Negative.   Neurological: Negative.   Endo/Heme/Allergies: Negative.   Psychiatric/Behavioral: Negative.     Blood pressure 131/81, pulse 70, temperature 98 F (36.7 C), temperature source Oral, resp. rate 18, height 5\' 7"  (1.702 m), weight 69.9 kg (154 lb), SpO2 99 %.Body mass index is 24.12 kg/m.  General Appearance: Fairly Groomed  Eye Contact:  Good  Speech:  Clear and Coherent  Volume:  Normal  Mood:  Euphoric mild  Affect:  Congruent  Thought Process:  Linear and Descriptions of Associations: Intact  Orientation:  Full (Time, Place, and Person)  Thought Content:  Hallucinations: None  Suicidal Thoughts:  No  Homicidal Thoughts:  No  Memory:  Immediate;   Good Recent;   Good Remote;   Good  Judgement:  Fair   Insight:  Fair  Psychomotor Activity:  Normal  Concentration:  Concentration: Good and Attention Span: Good  Recall:  Good  Fund of Knowledge:  Good  Language:  Good  Akathisia:  No  Handed:    AIMS (if indicated):     Assets:  Communication Skills Housing  ADL's:  Intact  Cognition:  WNL  Sleep:  Number of Hours: 5.5     Treatment Plan Summary:  Bipolar disorder: continue risperdal but will increase to 1 mg po q am and 3 mg po qhs.  He has been described as euphoric and hypersexual.   GAD: continue celexa 20 mg po qd. We will discontinue BuSpar  Cocaine and cannabis use disorder: continue with substance abuse treatment after discharge  Tobacco use diosrder: will order nicotine patch 21 mg/day  Asthma: continue albuterol prn  HIV: continue TRIUMEG  VS daily  Diet: regular  Precautions q 15  Checks  Hospitalization IVC  [facility]/u: [facility]  Potential d/c in 2 days   Jimmy FootmanHernandez-Gonzalez,  Kinberly Perris, MD 07/28/2016, 9:16 AM

## 2016-07-28 NOTE — Progress Notes (Signed)
D: Patient animated and euphoric. Denies SI/HI/AVH. Visible in the milieu interacting with patients. States he's ready for discharge. States he wants either Vistaril or Trazodone to go home with. States he's better and that it was a time for a change up in his medications.  A: Medication given with education. Encouragement provided. PRN Vistaril given.  R: Patient was compliant with medication. He has remained calm and cooperative. Safety maintained with 15 min checks.

## 2016-07-28 NOTE — Progress Notes (Signed)
Pt oob early and dressed ,offers no c/o is pleasant on approach . Going to groups and is compliant with meds, seems a little elated and has presured speech but denies si/hi

## 2016-07-28 NOTE — Plan of Care (Signed)
Problem: Safety: Goal: Periods of time without injury will increase Outcome: Progressing Patient has been without injury during this shift.    

## 2016-07-29 MED ORDER — RISPERIDONE 1 MG PO TABS: 1.0000 mg | ORAL_TABLET | Freq: Every day | ORAL | 0 refills | Status: DC

## 2016-07-29 MED ORDER — CITALOPRAM HYDROBROMIDE 20 MG PO TABS
20.0000 mg | ORAL_TABLET | Freq: Every day | ORAL | 0 refills | Status: DC
Start: 2016-07-29 — End: 2016-08-22

## 2016-07-29 MED ORDER — RISPERIDONE 3 MG PO TABS: 3.0000 mg | ORAL_TABLET | Freq: Every day | ORAL | 0 refills | Status: DC

## 2016-07-29 MED ORDER — ALBUTEROL SULFATE HFA 108 (90 BASE) MCG/ACT IN AERS: 2.0000 | INHALATION_SPRAY | Freq: Four times a day (QID) | RESPIRATORY_TRACT | 0 refills | Status: DC | PRN

## 2016-07-29 NOTE — [facility] Suicide Risk Assessment (Signed)
[facility]   Principal Problem: Bipolar affective disorder, current episode depressed [facility](HCC) Discharge Diagnoses:  Patient Active Problem List   Diagnosis Date Noted  . Cocaine use disorder, mild, abuse [F14.10] 07/27/2016  . Cannabis use disorder, moderate, dependence (HCC) [F12.20] 07/27/2016  . Tobacco use disorder [F17.200] 07/27/2016  . Intentional drug overdose (HCC) [T50.902A] 07/18/2016  . Bipolar affective disorder, current episode depressed (HCC) [F31.30] 05/04/2016  . Asthma [J45.909] 05/20/2007  . Human immunodeficiency virus (HIV) disease (HCC) [B20] 05/05/2007      Psychiatric Specialty Exam: ROS  Blood pressure 131/81, pulse 70, temperature 98 F (36.7 C), temperature source Oral, resp. rate 18, height 5\' 7"  (1.702 m), weight 69.9 kg (154 lb), SpO2 99 %.Body mass index is 24.12 kg/m.                                                       Mental Status Per Nursing Assessment::   On Admission:  Self-harm thoughts  Demographic Factors:  Male, Low socioeconomic status and Living alone  Loss Factors: Legal issues and Financial problems/change in socioeconomic status  Historical Factors: Prior suicide attempts and Impulsivity  Risk Reduction Factors:   Positive social support  Continued Clinical Symptoms:  Alcohol/Substance Abuse/Dependencies Previous Psychiatric Diagnoses and Treatments Medical Diagnoses and Treatments/Surgeries  Cognitive Features That Contribute To Risk:  None    Suicide Risk:  Minimal: No identifiable suicidal ideation.  Patients presenting with no risk factors but with morbid ruminations; may be classified as minimal risk based on the severity of the depressive symptoms  Follow-up Information    [facility]. Go on 09/09/2016.   Why:  Please arrive to your [facility] Dec[facility] 13th at 11AM. Arrive early for  prompt services. Bring your [facility] card with you and discharge paperwork. Contact information: Address: [address] [place], [place] [address] Phone: [phone](336) [phone] Fax: [phone]       [name]. Go on 07/30/2016.   Why:  It is important that you follow-up with someone 5-7 days of discharge for med management and therapy. [facility] 2 at 9:00AM. Bring discharge paperwork to appt.  Contact information[address], [place], [place] [address] Phone: [phone](336) [phone] Fax: [phone]           Jimmy FootmanHernandez-Gonzalez,  Krithik Mapel, MD 07/29/2016, 4:48 AM

## 2016-07-29 NOTE — Progress Notes (Signed)
Patient discharged home. DC instructions provided and explained. Medications reviewed. Rx given. All questions answered. Pt stable at discharge. Denies SI, HI, AVH.  

## 2016-07-29 NOTE — H&P (Signed)
  Treatment Goals  Psychiatric Specialty Exam: Physical Exam  ROS  Blood pressure 138/83, pulse 61, temperature 98 F (36.7 C), temperature source Oral, resp. rate 18, height 5\' 7"  (1.702 m), weight 69.9 kg (154 lb), SpO2 99 %.Body mass index is 24.12 kg/m.                                                    Sleep:  Number of Hours: 5.5    Physician Treatment Plan for Primary Diagnosis: Bipolar affective disorder, current episode depressed (HCC) Long Term Goal(s): Improvement in symptoms so as ready for discharge  Short Term Goals: Ability to identify changes in lifestyle to reduce recurrence of condition will improve, Ability to verbalize feelings will improve, Ability to disclose and discuss suicidal ideas, Ability to demonstrate self-control will improve, Ability to identify and develop effective coping behaviors will improve, Compliance with prescribed medications will improve and Ability to identify triggers associated with substance abuse/mental health issues will improve  Physician Treatment Plan for Secondary Diagnosis: Principal Problem:   Bipolar affective disorder, current episode depressed (HCC) Active Problems:   Human immunodeficiency virus (HIV) disease (HCC)   Asthma   Intentional drug overdose (HCC)   Cocaine use disorder, mild, abuse   Cannabis use disorder, moderate, dependence (HCC)   Tobacco use disorder  Long Term Goal(s): Improvement in symptoms so as ready for discharge  Short Term Goals: Ability to identify changes in lifestyle to reduce recurrence of condition will improve, Ability to identify and develop effective coping behaviors will improve and Ability to identify triggers associated with substance abuse/mental health issues will improve    Jimmy FootmanHernandez-Gonzalez,  Seriyah Collison, MD 11/1/201710:36 AM

## 2016-07-29 NOTE — Progress Notes (Signed)
  [facility] :  Will you be returning to the same living situation after discharge:  Yes,  home with partner. At discharge, do you have transportation home?: Yes,  [employer] [facility]. Do you have the ability to pay for your medications: No.  Release of information consent forms completed and in the chart;  Patient's signature needed at discharge.  Patient to Follow up at: Follow-up Information    [facility]. Go on 09/09/2016.   Why:  Please arrive to your appointment with [facility]Elizabeth Mulberry [facility] care on Dec. 13th at 11AM. Arrive early for prompt services. Bring your [facility] card with you and discharge paperwork. Contact information: Address: [address] [place], [place] [address] Phone: [phone](336) [phone] Fax: [phone]       [name]. Go on 07/30/2016.   Why:  It is important that you follow-up with someone 5-7 days of discharge for med management and therapy. [facility] Nov. 2 at 9:00AM. Bring discharge paperwork to appt.  Contact information: [address]Address:[address], [place], [place] [address] Phone: [phone] Fax: [phone]) [phone]          Next level of care provider has access to [facility] Link:no  Safety Planning and Suicide Prevention discussed: Yes,  SPE completed with patient and partner  Have you used any form of tobacco in the last 30 days? (Cigarettes, Smokeless Tobacco, Cigars, and/or Pipes): Yes  Has patient been referred to the Quitline?: Patient refused referral  Patient has been referred for addiction treatment: Pt. refused referral  Lynden OxfordKadijah R Pami Wool, MSW, LCSW-A 07/29/2016, 10:59 AM

## 2016-07-29 NOTE — Discharge Summary (Signed)
Physician Discharge Summary Note  Patient:  Edwin Martinez is an 30 y.o., male MRN:  [id number] DOB:  13-Nov-1985 Patient phone:  [phone] (home)  Patient address:   [address] [place] [address],  Total Time spent with patient: 30 minutes  Date of Admission:  07/24/2016 Date of Discharge: 07/29/16  Reason for Admission:  S/p overdose  Principal Problem: Bipolar affective disorder, current episode depressed [facility]) Discharge Diagnoses: Patient Active Problem List   Diagnosis Date Noted  . Cocaine use disorder, mild, abuse [F14.10] 07/27/2016  . Cannabis use disorder, moderate, dependence (HCC) [F12.20] 07/27/2016  . Tobacco use disorder [F17.200] 07/27/2016  . Intentional drug overdose (HCC) [T50.902A] 07/18/2016  . Bipolar affective disorder, current episode depressed (HCC) [F31.30] 05/04/2016  . Asthma [J45.909] 05/20/2007  . Human immunodeficiency virus (HIV) disease (HCC) [B20] 05/05/2007    Edwin Johns Russellis a 29 y.o.malepatient admitted with suicide attempt.  HPI: Pt is  30 year old single homosexual man with long history of Bipolar disorder, depression referred after  attempted suicide  by overdosing on 50 tablets of Depakote, 30 tablets ofNeurontin, 20 trazodone, 15-20 Prozac and 15-20 Naproxen. Per report/note- Patient reports non-compliant with his medications because he does not believe his current medication regimen is effective.  Pt reports he has been stressed out because his partner was out of touch for several days , was feeling lonely, hopeless,  states he does not remember the events afterwards. Endorses worsening depressive symptoms, hopelessness, lack of motivation, low energy level and tired of living.He says that he feels lonely, withdrawn and says "my soul is tired". Reports his only brother is incarcerated, Mother died of Heart attack at early age, father died of complications of Huntington's disease at age 19. Reports stressed out with   with multiple medical issues/HIV since 2008 and financial problem. Patient denies delusions, psychosis .  He denies drugs and alcohol abuse but his urine toxicology is positive for cocaine. states using cocaine just once. Reports anxiety " very often' . He does not want to take his current medications except Depakote and Trazodone anymore and requesting for a change.Agrees to try Celexa and Risperdal and increasing Buspar.  Denies SI /intent//plan today.   Past Psychiatric History: Bipolar disorder, depression dating back to age 19. Patient reports at least over 4 suicide attempts in the past. Has h/o in pt tx in [place] in 2001 Op in [place].  Meds a s above.   Past Medical History:  Past Medical History:  Diagnosis Date  . ADHD (attention deficit hyperactivity disorder)   . ADHD (attention deficit hyperactivity disorder) 09/12/2012  . Anxiety   . Asthma   . Bipolar 1 disorder (HCC)   . Bipolar disorder (HCC)   . Epileptic seizures (HCC)   . HIV (human immunodeficiency virus infection) (HCC)   . Hypertension   . Schizophrenia (HCC)   . Seizures (HCC)     Past Surgical History:  Procedure Laterality Date  . DENTAL SURGERY     Family History:  Family History  Problem Relation Age of Onset  . Huntington's disease Father   . Heart disease Mother    Family Psychiatric  History: Mom- bipolar  Social History:  History  Alcohol Use  . 1.2 oz/week  . 2 Standard drinks or equivalent per week    Comment: once week      History  Drug Use  . Frequency: 2.0 times per week  . Types: Marijuana, Cocaine    Comment: every other day  Social History   Social History  . Marital status: Single    Spouse name: N/A  . Number of children: N/A  . Years of education: N/A   Social History Main Topics  . Smoking status: Current Every Day Smoker    Packs/day: 0.50    Types: Cigarettes    Start date: 09/29/1991  . Smokeless tobacco: Never Used  . Alcohol use 1.2 oz/week    2 Standard  drinks or equivalent per week     Comment: once week   . Drug use:     Frequency: 2.0 times per week    Types: Marijuana, Cocaine     Comment: every other day   . Sexual activity: Yes    Birth control/ protection: Condom   Other Topics Concern  . None   Social History Narrative   ** Merged History Encounter **        Hospital Course:    Bipolar disorder: started on risperdal during this hospitalization as he was noted tho be  euphoric and hypersexual. Will be discharge on risperdal 1 mg po q am and risperdal 3 mg po qhs  GAD: pt was started on celexa 20 mg po qd to target anxiety symptoms  Cocaine and cannabis use disorder: continue with substance abuse treatment after discharge  Tobacco use diosrder: will order nicotine patch 21 mg/day  Asthma: continue albuterol prn  HIV: continue TRIUMEG  F/u: [facility]  No aggression or agitation reported. Did not require seclusion, restraints or forced medications. Compliant with medications. Pt has been attending and participating in groups.  Mood appears much improved, no longer euphoric or hypersexual. Pt has been sleeping well.  Denies SI, HI or hallucinations.  Tolerating well medications. Denies having any side effects or physical complaints.   Staff working with pt reports he is doing well. No concerns about his safety or safety of others after discharge.   Dispo: will d/c home  F/u: [facility]  Physical Findings: AIMS:  , ,  ,  ,    CIWA:    COWS:     Musculoskeletal: Strength & Muscle Tone: within normal limits Gait & Station: normal Patient leans: N/A  Psychiatric Specialty Exam: Physical Exam  Constitutional: He is oriented to person, place, and time. He appears well-developed and well-nourished.  HENT:  Head: Normocephalic and atraumatic.  Eyes: EOM are normal.  Neck: Normal range of motion.  Respiratory: Effort normal.  Musculoskeletal: Normal range of  motion.  Neurological: He is alert and oriented to person, place, and time.    Review of Systems  Constitutional: Negative.   HENT: Negative.   Eyes: Negative.   Respiratory: Negative.   Cardiovascular: Negative.   Gastrointestinal: Negative.   Genitourinary: Negative.   Musculoskeletal: Negative.   Skin: Negative.   Neurological: Negative.   Endo/Heme/Allergies: Negative.   Psychiatric/Behavioral: Positive for substance abuse. Negative for depression, hallucinations, memory loss and suicidal ideas. The patient is not nervous/anxious and does not have insomnia.     Blood pressure 131/81, pulse 70, temperature 98 F (36.7 C), temperature source Oral, resp. rate 18, height 5\' 7"  (1.702 m), weight 69.9 kg (154 lb), SpO2 99 %.Body mass index is 24.12 kg/m.  General Appearance: Fairly Groomed  Eye Contact:  Good  Speech:  Clear and Coherent  Volume:  Normal  Mood:  Euthymic  Affect:  Appropriate and Congruent  Thought Process:  Linear and Descriptions of Associations: Intact  Orientation:  Full (Time, Place, and Person)  Thought Content:  Hallucinations: None  Suicidal Thoughts:  No  Homicidal Thoughts:  No  Memory:  Immediate;   Fair Recent;   Fair Remote;   Fair  Judgement:  Fair  Insight:  Shallow  Psychomotor Activity:  Normal  Concentration:  Concentration: Fair and Attention Span: Fair  Recall:  Fair  Fund of Knowledge:  Good  Language:  Good  Akathisia:  No  Handed:    AIMS (if indicated):     Assets:  Communication Skills Housing Physical Health  ADL's:  Intact  Cognition:  WNL  Sleep:  Number of Hours: 5.5     Have you used any form of tobacco in the last 30 days? (Cigarettes, Smokeless Tobacco, Cigars, and/or Pipes): Yes  Has this patient used any form of tobacco in the last 30 days? (Cigarettes, Smokeless Tobacco, Cigars, and/or Pipes) Yes, Yes, A prescription for an FDA-approved tobacco cessation medication was offered at discharge and the patient  refused  Blood Alcohol level:  Lab Results  Component Value Date   ETH <5 07/18/2016   ETH 87 (H) 05/03/2016    Metabolic Disorder Labs:  Lab Results  Component Value Date   HGBA1C 5.5 05/04/2016   MPG 111 05/04/2016   No results found for: PROLACTIN Lab Results  Component Value Date   CHOL 136 02/05/2015   TRIG 147 02/05/2015   HDL 54 02/05/2015   CHOLHDL 2.5 02/05/2015   VLDL 29 02/05/2015   LDLCALC 53 02/05/2015   LDLCALC 45 02/21/2014    See Psychiatric Specialty Exam and Suicide Risk Assessment completed by Attending Physician prior to discharge.  Discharge destination:  Home  Is patient on multiple antipsychotic therapies at discharge:  No   Has Patient had three or more failed trials of antipsychotic monotherapy by history:  No  Recommended Plan for Multiple Antipsychotic Therapies: NA     Medication List    TAKE these medications     Indication  albuterol 108 (90 Base) MCG/ACT inhaler Commonly known as:  PROVENTIL HFA;VENTOLIN HFA Inhale 2 puffs into the lungs every 6 (six) hours as needed for wheezing or shortness of breath.  Indication:  Disease Involving Spasms of the Bronchus   citalopram 20 MG tablet Commonly known as:  CELEXA Take 1 tablet (20 mg total) by mouth daily.  Indication:  bipolar depression   risperiDONE 1 MG tablet Commonly known as:  RISPERDAL Take 1 tablet (1 mg total) by mouth daily.  Indication:  Manic-Depression   risperiDONE 3 MG tablet Commonly known as:  RISPERDAL Take 1 tablet (3 mg total) by mouth at bedtime.  Indication:  bipolar   TRIUMEQ 600-50-300 MG tablet Generic drug:  abacavir-dolutegravir-lamiVUDine Take 1 tablet by mouth at bedtime.  Indication:  HIV Disease      Follow-up Information    [facility]. Go on 09/09/2016.   Why:  Please arrive to your appointment with Dr. Julieanne Manson at [facility] to establish care on Dec. 13th at 11AM. Arrive early for prompt services. Bring your  [facility] card with you and discharge paperwork. Contact information: Address: [address] [place], [place] [address] Phone: [phone] Fax: [phone]       [name]. Go on 07/30/2016.   Why:  It is important that you follow-up with someone 5-7 days of discharge for med management and therapy. Please go to [facility] for an assessment, Nov. 2 at 9:00AM. Bring discharge paperwork to appt.  Contact  information: [address]Address:[address], [place], [place] [address] Phone: [phone] Fax: [phone]            Signed: Jimmy FootmanHernandez-Gonzalez,  Delores Edelstein, MD 07/29/2016, 4:51 AM

## 2016-07-29 NOTE — Tx Team (Signed)
Interdisciplinary Treatment and Diagnostic Plan Update  07/29/2016 Time of Session: 10:30 AM  Edwin Martinez MRN: [id number]  Principal Diagnosis: Bipolar affective disorder, current episode depressed (HCC)  Secondary Diagnoses: Principal Problem:   Bipolar affective disorder, current episode depressed (HCC) Active Problems:   Human immunodeficiency virus (HIV) disease (HCC)   Asthma   Intentional drug overdose (HCC)   Cocaine use disorder, mild, abuse   Cannabis use disorder, moderate, dependence (HCC)   Tobacco use disorder   Current Medications:  Current Facility-Administered Medications  Medication Dose Route Frequency Provider Last Rate Last Dose  . abacavir-dolutegravir-lamiVUDine (TRIUMEQ) 600-50-300 MG per tablet 1 tablet  1 tablet Oral QHS Jimmy FootmanAndrea Hernandez-Gonzalez, MD   1 tablet at 07/28/16 2146  . acetaminophen (TYLENOL) tablet 650 mg  650 mg Oral Q6H PRN Jimmy FootmanAndrea Hernandez-Gonzalez, MD      . albuterol (PROVENTIL HFA;VENTOLIN HFA) 108 (90 Base) MCG/ACT inhaler 2 puff  2 puff Inhalation Q6H PRN Jimmy FootmanAndrea Hernandez-Gonzalez, MD      . citalopram (CELEXA) tablet 20 mg  20 mg Oral Daily Beverly SessionsJagannath Subedi, MD   20 mg at 07/29/16 0808  . hydrOXYzine (ATARAX/VISTARIL) tablet 25 mg  25 mg Oral TID PRN Jimmy FootmanAndrea Hernandez-Gonzalez, MD   25 mg at 07/28/16 0904  . nicotine (NICODERM CQ - dosed in mg/24 hours) patch 21 mg  21 mg Transdermal Daily Jimmy FootmanAndrea Hernandez-Gonzalez, MD   21 mg at 07/28/16 0904  . risperiDONE (RISPERDAL) tablet 1 mg  1 mg Oral Daily Jimmy FootmanAndrea Hernandez-Gonzalez, MD   1 mg at 07/29/16 0808  . risperiDONE (RISPERDAL) tablet 3 mg  3 mg Oral QHS Jimmy FootmanAndrea Hernandez-Gonzalez, MD   3 mg at 07/28/16 2146   PTA Medications: Prescriptions Prior to Admission  Medication Sig Dispense Refill Last Dose  . abacavir-dolutegravir-lamiVUDine (TRIUMEQ) 600-50-300 MG tablet Take 1 tablet by mouth at bedtime.   07/17/2016  . [DISCONTINUED] albuterol (PROVENTIL HFA;VENTOLIN HFA) 108 (90 Base)  MCG/ACT inhaler Inhale 2 puffs into the lungs every 6 (six) hours as needed for wheezing or shortness of breath.   PRN    Patient Stressors: Financial difficulties Medication change or noncompliance Traumatic event  Patient Strengths: Ability for insight Average or above average intelligence Communication skills  Treatment Modalities: Medication Management, Group therapy, Case management,  1 to 1 session with clinician, Psychoeducation, Recreational therapy.   Physician Treatment Plan for Primary Diagnosis: Bipolar affective disorder, current episode depressed (HCC) Long Term Goal(s): Improvement in symptoms so as ready for discharge Improvement in symptoms so as ready for discharge   Short Term Goals: Ability to identify changes in lifestyle to reduce recurrence of condition will improve Ability to verbalize feelings will improve Ability to disclose and discuss suicidal ideas Ability to demonstrate self-control will improve Ability to identify and develop effective coping behaviors will improve Compliance with prescribed medications will improve Ability to identify triggers associated with substance abuse/mental health issues will improve Ability to identify changes in lifestyle to reduce recurrence of condition will improve Ability to identify and develop effective coping behaviors will improve Ability to identify triggers associated with substance abuse/mental health issues will improve  Medication Management: Evaluate patient's response, side effects, and tolerance of medication regimen.  Therapeutic Interventions: 1 to 1 sessions, Unit Group sessions and Medication administration.  Evaluation of Outcomes: Progressing  Physician Treatment Plan for Secondary Diagnosis: Principal Problem:   Bipolar affective disorder, current episode depressed (HCC) Active Problems:   Human immunodeficiency virus (HIV) disease (HCC)   Asthma   Intentional drug overdose (  HCC)   Cocaine use  disorder, mild, abuse   Cannabis use disorder, moderate, dependence (HCC)   Tobacco use disorder  Long Term Goal(s): Improvement in symptoms so as ready for discharge Improvement in symptoms so as ready for discharge   Short Term Goals: Ability to identify changes in lifestyle to reduce recurrence of condition will improve Ability to verbalize feelings will improve Ability to disclose and discuss suicidal ideas Ability to demonstrate self-control will improve Ability to identify and develop effective coping behaviors will improve Compliance with prescribed medications will improve Ability to identify triggers associated with substance abuse/mental health issues will improve Ability to identify changes in lifestyle to reduce recurrence of condition will improve Ability to identify and develop effective coping behaviors will improve Ability to identify triggers associated with substance abuse/mental health issues will improve     Medication Management: Evaluate patient's response, side effects, and tolerance of medication regimen.  Therapeutic Interventions: 1 to 1 sessions, Unit Group sessions and Medication administration.  Evaluation of Outcomes: Progressing   RN Treatment Plan for Primary Diagnosis: Bipolar affective disorder, current episode depressed (HCC) Long Term Goal(s): Knowledge of disease and therapeutic regimen to maintain health will improve  Short Term Goals: Ability to remain free from injury will improve, Ability to verbalize frustration and anger appropriately will improve, Ability to participate in decision making will improve, Ability to verbalize feelings will improve, Ability to disclose and discuss suicidal ideas and Compliance with prescribed medications will improve  Medication Management: RN will administer medications as ordered by provider, will assess and evaluate patient's response and provide education to patient for prescribed medication. RN will report  any adverse and/or side effects to prescribing provider.  Therapeutic Interventions: 1 on 1 counseling sessions, Psychoeducation, Medication administration, Evaluate responses to treatment, Monitor vital signs and CBGs as ordered, Perform/monitor CIWA, COWS, AIMS and Fall Risk screenings as ordered, Perform wound care treatments as ordered.  Evaluation of Outcomes: Progressing   LCSW Treatment Plan for Primary Diagnosis: Bipolar affective disorder, current episode depressed (HCC) Long Term Goal(s): Safe transition to appropriate next level of care at discharge, Engage patient in therapeutic group addressing interpersonal concerns.  Short Term Goals: Engage patient in aftercare planning with referrals and resources, Increase social support, Facilitate patient progression through stages of change regarding substance use diagnoses and concerns and Identify triggers associated with mental health/substance abuse issues  Therapeutic Interventions: Assess for all discharge needs, 1 to 1 time with Social worker, Explore available resources and support systems, Assess for adequacy in community support network, Educate family and significant other(s) on suicide prevention, Complete Psychosocial Assessment, Interpersonal group therapy.  Evaluation of Outcomes: Adequate for Discharge   Progress in Treatment: Attending groups: Yes. Participating in groups: Yes. Taking medication as prescribed: Yes. Toleration medication: Yes. Family/Significant other contact made: Yes, individual(s) contacted:  partner. Patient understands diagnosis: Yes. Discussing patient identified problems/goals with staff: Yes. Medical problems stabilized or resolved: Yes. Denies suicidal/homicidal ideation: Yes. Issues/concerns per patient self-inventory: No.   New problem(s) identified: No, Describe:  None.  New Short Term/Long Term Goal(s):  Discharge Plan or Barriers:   Reason for Continuation of Hospitalization:  Anxiety Depression Suicidal ideation Withdrawal symptoms  Estimated Length of Stay:  Attendees: Patient: Edwin Martinez  07/29/2016 11:20 AM  Physician: Dr. Ardyth HarpsHernandez, MD 07/29/2016 11:20 AM  Nursing: Shelia MediaJanet Jones, RN  07/29/2016 11:20 AM  RN Care Manager: 07/29/2016 11:20 AM  Social Worker: Hampton AbbotKadijah Praneeth Bussey, MSW, LCSW-A 07/29/2016 11:20 AM   Scribe for Treatment Team:  Lynden Oxford, LCSWA 07/29/2016 11:20 AM

## 2016-07-29 NOTE — [facility] Group Notes (Signed)
[facility] LCSW Group Therapy  07/29/2016 11:46 AM  Type of Therapy:  Group Therapy  Participation Level:  Patient did not attend group. CSW invited patient to group.   Summary of Progress/Problems:Balance in life: Patients will discuss the concept of balance and how it looks and feels to be unbalanced. Pt will identify areas in their life that is unbalanced and ways to become more balanced.    Adith Tejada [name] MSW, LCSWA 07/29/2016, 11:46 AM

## 2016-08-12 ENCOUNTER — Other Ambulatory Visit: Payer: Self-pay | Admitting: Licensed Clinical Social Worker

## 2016-08-17 ENCOUNTER — Encounter (HOSPITAL_COMMUNITY): Payer: Self-pay | Admitting: *Deleted

## 2016-08-17 ENCOUNTER — Encounter (HOSPITAL_COMMUNITY): Payer: Self-pay | Admitting: Emergency Medicine

## 2016-08-17 ENCOUNTER — Inpatient Hospital Stay (HOSPITAL_COMMUNITY)
Admission: AD | Admit: 2016-08-17 | Discharge: 2016-08-22 | DRG: 885 | Disposition: A | Discharge status: Home / Self Care | Payer: Federal, State, Local not specified - Other | Attending: Psychiatry | Admitting: Psychiatry

## 2016-08-17 ENCOUNTER — Emergency Department (HOSPITAL_COMMUNITY)
Admission: EM | Admit: 2016-08-17 | Discharge: 2016-08-17 | Disposition: A | Discharge status: Other Acute Inpatient Hospital | Payer: No Typology Code available for payment source | Attending: Emergency Medicine | Admitting: Emergency Medicine

## 2016-08-17 DIAGNOSIS — F17210 Nicotine dependence, cigarettes, uncomplicated: Secondary | ICD-10-CM | POA: Diagnosis present

## 2016-08-17 DIAGNOSIS — Z888 Allergy status to other drugs, medicaments and biological substances status: Secondary | ICD-10-CM | POA: Diagnosis not present

## 2016-08-17 DIAGNOSIS — Z818 Family history of other mental and behavioral disorders: Secondary | ICD-10-CM | POA: Diagnosis not present

## 2016-08-17 DIAGNOSIS — R45851 Suicidal ideations: Secondary | ICD-10-CM

## 2016-08-17 DIAGNOSIS — F17200 Nicotine dependence, unspecified, uncomplicated: Secondary | ICD-10-CM | POA: Diagnosis not present

## 2016-08-17 DIAGNOSIS — Z8489 Family history of other specified conditions: Secondary | ICD-10-CM | POA: Diagnosis not present

## 2016-08-17 DIAGNOSIS — F1094 Alcohol use, unspecified with alcohol-induced mood disorder: Secondary | ICD-10-CM | POA: Insufficient documentation

## 2016-08-17 DIAGNOSIS — Z720 Tobacco use: Secondary | ICD-10-CM | POA: Diagnosis present

## 2016-08-17 DIAGNOSIS — Z881 Allergy status to other antibiotic agents status: Secondary | ICD-10-CM | POA: Diagnosis not present

## 2016-08-17 DIAGNOSIS — Z79899 Other long term (current) drug therapy: Secondary | ICD-10-CM | POA: Diagnosis not present

## 2016-08-17 DIAGNOSIS — F1220 Cannabis dependence, uncomplicated: Secondary | ICD-10-CM | POA: Diagnosis present

## 2016-08-17 DIAGNOSIS — B20 Human immunodeficiency virus [HIV] disease: Secondary | ICD-10-CM | POA: Diagnosis present

## 2016-08-17 DIAGNOSIS — Z91010 Allergy to peanuts: Secondary | ICD-10-CM

## 2016-08-17 DIAGNOSIS — I10 Essential (primary) hypertension: Secondary | ICD-10-CM | POA: Insufficient documentation

## 2016-08-17 DIAGNOSIS — F909 Attention-deficit hyperactivity disorder, unspecified type: Secondary | ICD-10-CM | POA: Diagnosis present

## 2016-08-17 DIAGNOSIS — F3131 Bipolar disorder, current episode depressed, mild: Secondary | ICD-10-CM | POA: Diagnosis present

## 2016-08-17 DIAGNOSIS — F1410 Cocaine abuse, uncomplicated: Secondary | ICD-10-CM

## 2016-08-17 DIAGNOSIS — J45909 Unspecified asthma, uncomplicated: Secondary | ICD-10-CM | POA: Diagnosis present

## 2016-08-17 DIAGNOSIS — F314 Bipolar disorder, current episode depressed, severe, without psychotic features: Secondary | ICD-10-CM | POA: Insufficient documentation

## 2016-08-17 DIAGNOSIS — Z8249 Family history of ischemic heart disease and other diseases of the circulatory system: Secondary | ICD-10-CM

## 2016-08-17 DIAGNOSIS — F419 Anxiety disorder, unspecified: Secondary | ICD-10-CM | POA: Diagnosis present

## 2016-08-17 DIAGNOSIS — Z915 Personal history of self-harm: Secondary | ICD-10-CM

## 2016-08-17 DIAGNOSIS — Z880 Allergy status to penicillin: Secondary | ICD-10-CM | POA: Diagnosis not present

## 2016-08-17 DIAGNOSIS — F319 Bipolar disorder, unspecified: Secondary | ICD-10-CM | POA: Diagnosis not present

## 2016-08-17 DIAGNOSIS — G4700 Insomnia, unspecified: Secondary | ICD-10-CM | POA: Diagnosis present

## 2016-08-17 DIAGNOSIS — Z9889 Other specified postprocedural states: Secondary | ICD-10-CM | POA: Diagnosis not present

## 2016-08-17 LAB — RAPID URINE DRUG SCREEN, HOSP PERFORMED
Amphetamines: NOT DETECTED
Barbiturates: NOT DETECTED
Benzodiazepines: NOT DETECTED
Cocaine: POSITIVE — AB
Opiates: NOT DETECTED
Tetrahydrocannabinol: POSITIVE — AB

## 2016-08-17 LAB — COMPREHENSIVE METABOLIC PANEL
ALT: 23 U/L (ref 17–63)
AST: 38 U/L (ref 15–41)
Albumin: 4.3 g/dL (ref 3.5–5.0)
Alkaline Phosphatase: 77 U/L (ref 38–126)
Anion gap: 10 (ref 5–15)
BUN: 10 mg/dL (ref 6–20)
CHLORIDE: 111 mmol/L (ref 101–111)
CO2: 20 mmol/L — AB (ref 22–32)
Calcium: 8.7 mg/dL — ABNORMAL LOW (ref 8.9–10.3)
Creatinine, Ser: 0.95 mg/dL (ref 0.61–1.24)
GFR calc Af Amer: >60 mL/min (ref >60)
Glucose, Bld: 158 mg/dL — ABNORMAL HIGH (ref 65–99)
POTASSIUM: 3.4 mmol/L — AB (ref 3.5–5.1)
SODIUM: 141 mmol/L (ref 135–145)
Total Bilirubin: 0.3 mg/dL (ref 0.3–1.2)
Total Protein: 7.4 g/dL (ref 6.5–8.1)

## 2016-08-17 LAB — CBC WITH DIFFERENTIAL/PLATELET
Basophils Absolute: 0 K/uL (ref 0.0–0.1)
Basophils Relative: 0 %
Eosinophils Absolute: 0.3 K/uL (ref 0.0–0.7)
Eosinophils Relative: 3 %
HCT: 39.6 % (ref 39.0–52.0)
Hemoglobin: 13.6 g/dL (ref 13.0–17.0)
Lymphocytes Relative: 58 %
Lymphs Abs: 6.6 K/uL — ABNORMAL HIGH (ref 0.7–4.0)
MCH: 29.6 pg (ref 26.0–34.0)
MCHC: 34.3 g/dL (ref 30.0–36.0)
MCV: 86.3 fL (ref 78.0–100.0)
Monocytes Absolute: 0.9 K/uL (ref 0.1–1.0)
Monocytes Relative: 8 %
Neutro Abs: 3.5 K/uL (ref 1.7–7.7)
Neutrophils Relative %: 31 %
Platelets: 303 K/uL (ref 150–400)
RBC: 4.59 MIL/uL (ref 4.22–5.81)
RDW: 13.5 % (ref 11.5–15.5)
WBC: 11.3 K/uL — ABNORMAL HIGH (ref 4.0–10.5)

## 2016-08-17 LAB — ACETAMINOPHEN LEVEL: Acetaminophen (Tylenol), Serum: <10 ug/mL — ABNORMAL LOW (ref 10–30)

## 2016-08-17 LAB — SALICYLATE LEVEL: Salicylate Lvl: <7 mg/dL (ref 2.8–30.0)

## 2016-08-17 LAB — ETHANOL: ALCOHOL ETHYL (B): 187 mg/dL — AB (ref <5)

## 2016-08-17 LAB — PATHOLOGIST SMEAR REVIEW

## 2016-08-17 LAB — AMMONIA: AMMONIA: 48 umol/L — AB (ref 9–35)

## 2016-08-17 MED ORDER — CITALOPRAM HYDROBROMIDE 20 MG PO TABS
20.0000 mg | ORAL_TABLET | Freq: Every day | ORAL | Status: DC
Start: 2016-08-18 — End: 2016-08-22
  Administered 2016-08-18 – 2016-08-22 (×5): 20 mg via ORAL
  Filled 2016-08-17 (×8): qty 1

## 2016-08-17 MED ORDER — RISPERIDONE 3 MG PO TABS
3.0000 mg | ORAL_TABLET | Freq: Every day | ORAL | Status: DC
  Administered 2016-08-17 – 2016-08-21 (×5): 3 mg via ORAL
  Filled 2016-08-17 (×4): qty 1
  Filled 2016-08-17: qty 3
  Filled 2016-08-17 (×4): qty 1

## 2016-08-17 MED ORDER — ALBUTEROL SULFATE HFA 108 (90 BASE) MCG/ACT IN AERS: 2.0000 | INHALATION_SPRAY | Freq: Four times a day (QID) | RESPIRATORY_TRACT | Status: DC | PRN

## 2016-08-17 MED ORDER — STERILE WATER FOR INJECTION IJ SOLN
INTRAMUSCULAR | Status: AC
  Administered 2016-08-17: 03:00:00
  Filled 2016-08-17: qty 10

## 2016-08-17 MED ORDER — RISPERIDONE 1 MG PO TABS
1.0000 mg | ORAL_TABLET | Freq: Every day | ORAL | Status: DC
  Administered 2016-08-18 – 2016-08-22 (×5): 1 mg via ORAL
  Filled 2016-08-17 (×8): qty 1

## 2016-08-17 MED ORDER — RISPERIDONE 2 MG PO TABS: 3.0000 mg | ORAL_TABLET | Freq: Every day | ORAL | Status: DC

## 2016-08-17 MED ORDER — ZIPRASIDONE MESYLATE 20 MG IM SOLR
INTRAMUSCULAR | Status: AC
  Administered 2016-08-17: 10 mg
  Filled 2016-08-17: qty 20

## 2016-08-17 MED ORDER — CITALOPRAM HYDROBROMIDE 10 MG PO TABS
20.0000 mg | ORAL_TABLET | Freq: Every day | ORAL | Status: DC
  Administered 2016-08-17: 20 mg via ORAL
  Filled 2016-08-17: qty 2

## 2016-08-17 MED ORDER — ABACAVIR-DOLUTEGRAVIR-LAMIVUD 600-50-300 MG PO TABS
1.0000 | ORAL_TABLET | Freq: Every day | ORAL | Status: DC
  Administered 2016-08-17 – 2016-08-21 (×5): 1 via ORAL
  Filled 2016-08-17 (×3): qty 1
  Filled 2016-08-17: qty 3
  Filled 2016-08-17 (×4): qty 1

## 2016-08-17 MED ORDER — RISPERIDONE 1 MG PO TABS
1.0000 mg | ORAL_TABLET | Freq: Every day | ORAL | Status: DC
  Administered 2016-08-17: 1 mg via ORAL
  Filled 2016-08-17: qty 1

## 2016-08-17 MED ORDER — ACETAMINOPHEN 325 MG PO TABS: 650.0000 mg | ORAL_TABLET | Freq: Four times a day (QID) | ORAL | Status: DC | PRN

## 2016-08-17 MED ORDER — ABACAVIR-DOLUTEGRAVIR-LAMIVUD 600-50-300 MG PO TABS
1.0000 | ORAL_TABLET | Freq: Every day | ORAL | Status: DC
  Filled 2016-08-17: qty 1

## 2016-08-17 NOTE — BH Assessment (Signed)
Tele Assessment Note   Edwin Martinez is an 30 y.o. male. Pt presents as drowsy during assessment. Pt restrained to bed via handcuffs and medicated due to disruptive aggressive behaviors. Pt presents to hospital via [employer] after stating to boyfriend during argument "what if I kill myself". Pt reports verbalizing question but, denies Suicidal ideation, plan, and intent. Pt reports history of suicide attempt. Pt denies homicidal ideation and thoughts of harm towards others. Pt denies access to firearms/weapons. Pt denies hallucinations. Pt reports being followed by [facility] for OPT services. Pt states he is not aware if he is prescribed any psychiatric medications at this time. Pt denies history of abuse.  Per chart- pt was [facility]10[facility]11.1.17) after treatment for bipolar d/o and suicide attempt (50 tablets of Depakote, 30 tablets of Neurontin, 20 trazodone, 15-20 Prozac and 15-20 Naproxen).  Diagnosis: F31.30 bipolar disorder, depressed (per chart)  Past Medical History:  Past Medical History:  Diagnosis Date  . ADHD (attention deficit hyperactivity disorder)   . ADHD (attention deficit hyperactivity disorder) 09/12/2012  . Anxiety   . Asthma   . Bipolar 1 disorder (HCC)   . Bipolar disorder (HCC)   . Epileptic seizures (HCC)   . HIV (human immunodeficiency virus infection) (HCC)   . Hypertension   . Schizophrenia (HCC)   . Seizures (HCC)     Past Surgical History:  Procedure Laterality Date  . DENTAL SURGERY      Family History:  Family History  Problem Relation Age of Onset  . Huntington's disease Father   . Heart disease Mother     Social History:  reports that he has been smoking Cigarettes.  He started smoking about 24 years ago. He has been smoking about 0.50 packs per day. He has never used smokeless tobacco. He reports that he drinks about 1.2 oz of alcohol per week . He reports that he uses drugs, including Marijuana and Cocaine,  about 2 times per week.  Additional Social History:  Alcohol / Drug Use Pain Medications: Pt denies abuse Prescriptions: Pt denies abuse Over the Counter: Pt denies abuse History of alcohol / drug use?: No history of alcohol / drug abuse  CIWA: CIWA-Ar BP: (!) 119/104 Pulse Rate: 102 COWS:    PATIENT STRENGTHS: (choose at least two) Average or above average intelligence General fund of knowledge  Allergies:  Allergies  Allergen Reactions  . Magnesium-Containing Compounds Other (See Comments)    This medication is contraindicated with pts HIV meds.    . Peanut-Containing Drug Products Anaphylaxis  . Atripla [Efavirenz-Emtricitab-Tenofovir] Other (See Comments)    Reaction:  Suicidal thoughts   . Esomeprazole Magnesium Cough  . Bactrim [Sulfamethoxazole-Trimethoprim] Rash  . Penicillins Rash and Other (See Comments)    Has patient had a PCN reaction causing immediate rash, facial/tongue/throat swelling, SOB or lightheadedness with hypotension: Yes Has patient had a PCN reaction causing severe rash involving mucus membranes or skin necrosis: No Has patient had a PCN reaction that required hospitalization No Has patient had a PCN reaction occurring within the last 10 years: No If all of the above answers are "NO", then may proceed with Cephalosporin use.    Home Medications:  (Not in a hospital admission)  OB/GYN Status:  No LMP for male patient.  General Assessment Data Location of Assessment: [facility] TTS Assessment: In system Is this a Tele or Face-to-Face Assessment?: Face-to-Face Is this an Initial Assessment or a Re-assessment for this encounter?: Initial Assessment Marital  status: Single Is patient pregnant?: No Pregnancy Status: No Living Arrangements: Alone Can pt return to current living arrangement?: Yes Admission Status: Involuntary Is patient capable of signing voluntary admission?: No Referral Source: Self/Family/Friend Insurance type: Self-Pay      Crisis Care Plan Living Arrangements: Alone Name of Psychiatrist: [facility]wn Name of Therapist: [facility]  Education Status Is patient currently in school?: No Highest grade of school patient has completed: 8th  Risk to self with the past 6 months Suicidal Ideation: No-Not Currently/Within Last 6 Months (pt denies) Has patient been a risk to self within the past 6 months prior to admission? : Yes Suicidal Intent: No-Not Currently/Within Last 6 Months Has patient had any suicidal intent within the past 6 months prior to admission? : Yes Is patient at risk for suicide?: Yes Suicidal Plan?: No-Not Currently/Within Last 6 Months Has patient had any suicidal plan within the past 6 months prior to admission? : Yes Access to Means: Yes Specify Access to Suicidal Means: Access to prescription medications What has been your use of drugs/alcohol within the last 12 months?: Pt denis use Previous Attempts/Gestures: Yes How many times?:  (UTA) Other Self Harm Risks: UTA Triggers for Past Attempts: Unknown Intentional Self Injurious Behavior:  (UTA) Family Suicide History: No Recent stressful life event(s): Conflict (Comment) (Conflict with partner) Persecutory voices/beliefs?:  Edwin Martinez(UTA) Depression:  (UTA) Substance abuse history and/or treatment for substance abuse?: Yes (Per Chart-Pt denies) Suicide prevention information given to non-admitted patients: Not applicable  Risk to Others within the past 6 months Homicidal Ideation: No Does patient have any lifetime risk of violence toward others beyond the six months prior to admission? : Unknown Thoughts of Harm to Others: No Current Homicidal Intent: No Current Homicidal Plan: No Access to Homicidal Means: No History of harm to others?: No Assessment of Violence: On admission Violent Behavior Description: Pt verbally aggressive and destroying property upon arrival Does patient have access to weapons?: No Criminal Charges  Pending?: No Does patient have a court date: No Is patient on probation?: No  Psychosis Hallucinations:  (UTA) Delusions:  (UTA)  Mental Status Report Appearance/Hygiene: In scrubs Eye Contact: Poor (Eyes closed throughout assessment) Motor Activity: Unremarkable Speech: Logical/coherent, Soft Level of Consciousness: Sleeping, Drowsy Mood: Other (Comment) (Mood congruent) Affect: Unable to Assess Anxiety Level: None Thought Processes: Coherent, Relevant Judgement: Partial Orientation: Person, Place, Situation Obsessive Compulsive Thoughts/Behaviors: None  Cognitive Functioning Concentration: Decreased Memory: Recent Intact, Remote Intact IQ: Average Insight: Poor Impulse Control: Poor Appetite:  (UTA) Weight Loss:  (UTA) Weight Gain:  (UTA) Sleep: Unable to Assess Total Hours of Sleep:  (UTA) Vegetative Symptoms: Unable to Assess  ADLScreening [facility] Assessment Services) Patient's cognitive ability adequate to safely complete daily activities?: Yes Patient able to express need for assistance with ADLs?: Yes Independently performs ADLs?: Yes (appropriate for developmental age)  Prior Inpatient Therapy Prior Inpatient Therapy: Yes Prior Therapy Dates: 10.27.17-11.1.17 per chart [facility] Reason for Treatment: bipolar d/o, sucide attempt  Prior Outpatient Therapy Prior Outpatient Therapy: Yes Prior Therapy Dates: Ongoing Prior Therapy Facilty/Provider(s): [facility] Reason for Treatment: Not Reported Does patient have an ACCT team?: Unknown Does patient have Intensive In-House Services?  : No Does patient have [name] services? : Unknown Does patient have P4CC services?: Unknown  ADL Screening (condition at time of admission) Patient's cognitive ability adequate to safely complete daily activities?: Yes Is the patient deaf or have difficulty hearing?: No Does the patient have difficulty seeing, even when wearing  glasses/contacts?: No Does the patient have difficulty concentrating, remembering, or making decisions?: No Patient able to express need for assistance with ADLs?: Yes Does the patient have difficulty dressing or bathing?: No Independently performs ADLs?: Yes (appropriate for developmental age) Does the patient have difficulty walking or climbing stairs?: No Weakness of Legs: None Weakness of Arms/Hands: None  Home Assistive Devices/Equipment Home Assistive Devices/Equipment: None  Therapy Consults (therapy consults require a physician order) PT Evaluation Needed: No OT Evalulation Needed: No SLP Evaluation Needed: No Abuse/Neglect Assessment (Assessment to be complete while patient is alone) Physical Abuse: Denies Verbal Abuse: Denies Sexual Abuse: Denies Exploitation of patient/patient's resources: Denies Self-Neglect: Denies Values / Beliefs Cultural Requests During Hospitalization: None Spiritual Requests During Hospitalization: None Consults Spiritual Care Consult Needed: No Social Work Consult Needed: No Merchant navy officer (For Healthcare) Does patient have an advance directive?: No Would patient like information on creating an advanced directive?: No - patient declined information    Additional Information 1:1 In Past 12 Months?: Yes CIRT Risk: Yes Elopement Risk: No Does patient have medical clearance?: No     Disposition: Clinician consulted with Nira Conn, NP and pt is recommended for AM psych Jerilynn Birkenhead, RN informed of pt disposition.  Disposition Initial Assessment Completed for this Encounter: Yes Disposition of Patient: Other dispositions Other disposition(s): Other (Comment) (Pending psychiatric assessment)  Brannan Cassedy J [place] 08/17/2016 4:19 AM

## 2016-08-17 NOTE — ED Notes (Signed)
Sleeping soundly. Will give meds when patient awakes.

## 2016-08-17 NOTE — ED Notes (Signed)
[facility] Patient has IVC papers.

## 2016-08-17 NOTE — Consult Note (Addendum)
[place] [facility] Consult   Reason for Consult:  Psychiatric evaluation Referring Physician:  EDP Patient Identification: Edwin Martinez MRN:  [id number] Principal Diagnosis: Bipolar affective disorder, current episode depressed [facility]) Diagnosis:   Patient Active Problem List   Diagnosis Date Noted  . Cocaine use disorder, mild, abuse [F14.10] 07/27/2016  . Cannabis use disorder, moderate, dependence ([place]) [F12.20] 07/27/2016  . Tobacco use disorder [F17.200] 07/27/2016  . Intentional drug overdose ([place]) [T50.902A] 07/18/2016  . Bipolar affective disorder, current episode depressed ([place]) [F31.30] 05/04/2016  . Asthma [J45.909] 05/20/2007  . Human immunodeficiency virus (HIV) disease ([place]) [B20] 05/05/2007    Total Time spent with patient: 30 minutes  Subjective:   Edwin Martinez is a 30 y.o. male patient who states "I been depressed."  HPI:  Per behavioral health therapeutic triage assessment, Edwin Martinez is an 30 y.o. male. Pt presents as drowsy during assessment. Pt restrained to bed via handcuffs and medicated due to disruptive aggressive behaviors. Pt presents to hospital via [facility] after stating to boyfriend during argument "what if I kill myself". Pt reports verbalizing question but, denies Suicidal ideation, plan, and intent. Pt reports history of suicide attempt. Pt denies homicidal ideation and thoughts of harm towards others. Pt denies access to firearms/weapons. Pt denies hallucinations. Pt reports being followed by [facility] counseling for OPT services. Pt states he is not aware if he is prescribed any psychiatric medications at this time. Pt denies history of abuse. Per chart- pt was recently discharged from [facility] (10.27.17-11.1.17) after treatment for bipolar d/o and suicide attempt (50 tablets of Depakote, 30 tablets ofNeurontin, 20 trazodone, 15-20 Prozac and 15-20 Naproxen).  Evaluation on the unit: Chart and nursing notes reviewed. Patient is alert,  oriented 4, calm and cooperative. Patient states that he is been having depression ongoing for over a month. He was brought to the emergency department under IVC and was agitated on arrival to the emergency department. Today, he continues to attend doors suicidal ideation without a plan. He denies homicidal ideation, intent or plan. He denies AVH. He reports a past psychiatric history of depression and bipolar disorder. He has received outpatient medication management at [facility], but states he does not like going there due to the long wait to be seen. He states he has been off his medication for an unknown amount of time. Reports a last hospitalization at [facility] in October ater an overdose in a suicide attempt.   Past Psychiatric History: depression, bipolar  Risk to Self: Suicidal Ideation - YES Risk to Others: Homicidal Ideation: No Thoughts of Harm to Others: No Current Homicidal Intent: No Current Homicidal Plan: No Access to Homicidal Means: No History of harm to others?: No Assessment of Violence: On admission Violent Behavior Description: Pt verbally aggressive and destroying property upon arrival Does patient have access to weapons?: No Criminal Charges Pending?: No Does patient have a court date: No Prior Inpatient Therapy: Prior Inpatient Therapy: Yes Prior Therapy Dates: 10.27.17-11.1.17 per chart Prior Therapy Facilty/Provider(s): [facility] Reason for Treatment: bipolar d/o, sucide attempt Prior Outpatient Therapy: Prior Outpatient Therapy: Yes Prior Therapy Dates: Ongoing Prior Therapy Facilty/Provider(s): [name] Reason for Treatment: Not Reported Does patient have an ACCT team?: Unknown Does patient have Intensive In-House Services?  : No Does patient have [name] services? : Unknown Does patient have P4CC services?: Unknown  Past Medical History:  Past Medical History:  Diagnosis Date  . ADHD (attention deficit hyperactivity disorder)   . ADHD (attention  deficit hyperactivity  disorder) 09/12/2012  . Anxiety   . Asthma   . Bipolar 1 disorder ([place])   . Bipolar disorder ([place])   . Epileptic seizures ([place])   . HIV (human immunodeficiency virus infection) ([place])   . Hypertension   . Schizophrenia ([place])   . Seizures ([place])     Past Surgical History:  Procedure Laterality Date  . DENTAL SURGERY     Family History:  Family History  Problem Relation Age of Onset  . Huntington's disease Father   . Heart disease Mother    Family Psychiatric  History: denies Social History:  History  Alcohol Use  . 1.2 oz/week  . 2 Standard drinks or equivalent per week    Comment: once week      History  Drug Use  . Frequency: 2.0 times per week  . Types: Marijuana, Cocaine    Comment: every other day     Social History   Social History  . Marital status: Single    Spouse name: N/A  . Number of children: N/A  . Years of education: N/A   Social History Main Topics  . Smoking status: Current Every Day Smoker    Packs/day: 0.50    Types: Cigarettes    Start date: 09/29/1991  . Smokeless tobacco: Never Used  . Alcohol use 1.2 oz/week    2 Standard drinks or equivalent per week     Comment: once week   . Drug use:     Frequency: 2.0 times per week    Types: Marijuana, Cocaine     Comment: every other day   . Sexual activity: Yes    Birth control/ protection: Condom   Other Topics Concern  . None   Social History Narrative   ** Merged History Encounter **       Additional Social History:    Allergies:   Allergies  Allergen Reactions  . Magnesium-Containing Compounds Other (See Comments)    This medication is contraindicated with pts HIV meds.    . Peanut-Containing Drug Products Anaphylaxis  . Atripla [Efavirenz-Emtricitab-Tenofovir] Other (See Comments)    Reaction:  Suicidal thoughts   . Esomeprazole Magnesium Cough  . Bactrim [Sulfamethoxazole-Trimethoprim] Rash  . Penicillins Rash and Other (See Comments)    Has patient  had a PCN reaction causing immediate rash, facial/tongue/throat swelling, SOB or lightheadedness with hypotension: Yes Has patient had a PCN reaction causing severe rash involving mucus membranes or skin necrosis: No Has patient had a PCN reaction that required hospitalization No Has patient had a PCN reaction occurring within the last 10 years: No If all of the above answers are "NO", then may proceed with Cephalosporin use.    Labs:  Results for orders placed or performed during the hospital encounter of 08/17/16 (from the past 48 hour(s))  CBC with Differential     Status: Abnormal   Collection Time: 08/17/16  3:05 AM  Result Value Ref Range   WBC 11.3 (H) 4.0 - 10.5 K/uL   RBC 4.59 4.22 - 5.81 MIL/uL   Hemoglobin 13.6 13.0 - 17.0 g/dL   HCT 39.6 39.0 - 52.0 %   MCV 86.3 78.0 - 100.0 fL   MCH 29.6 26.0 - 34.0 pg   MCHC 34.3 30.0 - 36.0 g/dL   RDW 13.5 11.5 - 15.5 %   Platelets 303 150 - 400 K/uL   Neutrophils Relative % 31 %   Lymphocytes Relative 58 %   Monocytes Relative 8 %   Eosinophils Relative  3 %   Basophils Relative 0 %   Neutro Abs 3.5 1.7 - 7.7 K/uL   Lymphs Abs 6.6 (H) 0.7 - 4.0 K/uL   Monocytes Absolute 0.9 0.1 - 1.0 K/uL   Eosinophils Absolute 0.3 0.0 - 0.7 K/uL   Basophils Absolute 0.0 0.0 - 0.1 K/uL   WBC Morphology ABSOLUTE LYMPHOCYTOSIS   Comprehensive metabolic panel     Status: Abnormal   Collection Time: 08/17/16  3:05 AM  Result Value Ref Range   Sodium 141 135 - 145 mmol/L   Potassium 3.4 (L) 3.5 - 5.1 mmol/L   Chloride 111 101 - 111 mmol/L   CO2 20 (L) 22 - 32 mmol/L   Glucose, Bld 158 (H) 65 - 99 mg/dL   BUN 10 6 - 20 mg/dL   Creatinine, Ser 0.95 0.61 - 1.24 mg/dL   Calcium 8.7 (L) 8.9 - 10.3 mg/dL   Total Protein 7.4 6.5 - 8.1 g/dL   Albumin 4.3 3.5 - 5.0 g/dL   AST 38 15 - 41 U/L   ALT 23 17 - 63 U/L   Alkaline Phosphatase 77 38 - 126 U/L   Total Bilirubin 0.3 0.3 - 1.2 mg/dL   GFR calc non Af Amer >60 >60 mL/min   GFR calc Af Amer >60  >60 mL/min    Comment: (NOTE) The eGFR has been calculated using the CKD EPI equation. This calculation has not been validated in all clinical situations. eGFR's persistently <60 mL/min signify possible Chronic Kidney Disease.    Anion gap 10 5 - 15  Ethanol     Status: Abnormal   Collection Time: 08/17/16  3:05 AM  Result Value Ref Range   Alcohol, Ethyl (B) 187 (H) <5 mg/dL    Comment:        LOWEST DETECTABLE LIMIT FOR SERUM ALCOHOL IS 5 mg/dL FOR MEDICAL PURPOSES ONLY   Acetaminophen level     Status: Abnormal   Collection Time: 08/17/16  3:05 AM  Result Value Ref Range   Acetaminophen (Tylenol), Serum <10 (L) 10 - 30 ug/mL    Comment:        THERAPEUTIC CONCENTRATIONS VARY SIGNIFICANTLY. A RANGE OF 10-30 ug/mL MAY BE AN EFFECTIVE CONCENTRATION FOR MANY PATIENTS. HOWEVER, SOME ARE BEST TREATED AT CONCENTRATIONS OUTSIDE THIS RANGE. ACETAMINOPHEN CONCENTRATIONS >150 ug/mL AT 4 HOURS AFTER INGESTION AND >50 ug/mL AT 12 HOURS AFTER INGESTION ARE OFTEN ASSOCIATED WITH TOXIC REACTIONS.   Salicylate level     Status: None   Collection Time: 08/17/16  3:05 AM  Result Value Ref Range   Salicylate Lvl <6.2 2.8 - 30.0 mg/dL  Pathologist smear review     Status: None   Collection Time: 08/17/16  3:05 AM  Result Value Ref Range   Path Review Reviewed by Kalman Drape, M.D.     Comment: 11.20.17 MILD LEUKOCYTOSIS WITH ATYPICAL LYMPHOCYTOSIS.   Ammonia     Status: Abnormal   Collection Time: 08/17/16 11:53 AM  Result Value Ref Range   Ammonia 48 (H) 9 - 35 umol/L  Rapid urine drug screen (hospital performed)     Status: Abnormal   Collection Time: 08/17/16 12:00 PM  Result Value Ref Range   Opiates NONE DETECTED NONE DETECTED   Cocaine POSITIVE (A) NONE DETECTED   Benzodiazepines NONE DETECTED NONE DETECTED   Amphetamines NONE DETECTED NONE DETECTED   Tetrahydrocannabinol POSITIVE (A) NONE DETECTED   Barbiturates NONE DETECTED NONE DETECTED    Comment:  DRUG  SCREEN FOR MEDICAL PURPOSES ONLY.  IF CONFIRMATION IS NEEDED FOR ANY PURPOSE, NOTIFY LAB WITHIN 5 DAYS.        LOWEST DETECTABLE LIMITS FOR URINE DRUG SCREEN Drug Class       Cutoff (ng/mL) Amphetamine      1000 Barbiturate      200 Benzodiazepine   893 Tricyclics       734 Opiates          300 Cocaine          300 THC              50     Current Facility-Administered Medications  Medication Dose Route Frequency Provider Last Rate Last Dose  . abacavir-dolutegravir-lamiVUDine (TRIUMEQ) 600-50-300 MG per tablet 1 tablet  1 tablet Oral QHS Antonietta Breach, PA-C      . albuterol (PROVENTIL HFA;VENTOLIN HFA) 108 (90 Base) MCG/ACT inhaler 2 puff  2 puff Inhalation Q6H PRN Antonietta Breach, PA-C      . citalopram (CELEXA) tablet 20 mg  20 mg Oral Daily [employer], PA-C   20 mg at 08/17/16 1120  . risperiDONE (RISPERDAL) tablet 1 mg  1 mg Oral Daily Antonietta Breach, PA-C   1 mg at 08/17/16 1121  . risperiDONE (RISPERDAL) tablet 3 mg  3 mg Oral QHS Antonietta Breach, PA-C       Current Outpatient Prescriptions  Medication Sig Dispense Refill  . abacavir-dolutegravir-lamiVUDine (TRIUMEQ) 600-50-300 MG tablet Take 1 tablet by mouth at bedtime.    Marland Kitchen albuterol (PROVENTIL HFA;VENTOLIN HFA) 108 (90 Base) MCG/ACT inhaler Inhale 2 puffs into the lungs every 6 (six) hours as needed for wheezing or shortness of breath. 1 Inhaler 0  . citalopram (CELEXA) 20 MG tablet Take 1 tablet (20 mg total) by mouth daily. 30 tablet 0  . risperiDONE (RISPERDAL) 1 MG tablet Take 1 tablet (1 mg total) by mouth daily. 30 tablet 0  . risperiDONE (RISPERDAL) 3 MG tablet Take 1 tablet (3 mg total) by mouth at bedtime. 30 tablet 0    Musculoskeletal: Strength & Muscle Tone: within normal limits Gait & Station: normal Patient leans: N/A  Psychiatric Specialty Exam: Physical Exam  Nursing note and vitals reviewed.   Review of Systems  Constitutional: Negative.   HENT: Negative.   Eyes: Negative.   Respiratory: Negative.    Cardiovascular: Negative.   Gastrointestinal: Negative.   Genitourinary: Negative.   Musculoskeletal: Negative.   Skin: Negative.   Neurological: Negative.   Endo/Heme/Allergies: Negative.   Psychiatric/Behavioral: Positive for depression, substance abuse and suicidal ideas.    Blood pressure 115/73, pulse 86, temperature 98 F (36.7 C), temperature source Oral, resp. rate 18, height 5' 8"  (1.727 m), weight 69.9 kg (154 lb), SpO2 100 %.Body mass index is 23.42 kg/m.  General Appearance: Fairly Groomed  Eye Contact:  Fair  Speech:  Clear and Coherent and Slow  Volume:  Decreased  Mood:  Depressed  Affect:  Congruent and Depressed  Thought Process:  Coherent  Orientation:  Full (Time, Place, and Person)  Thought Content:  Logical  Suicidal Thoughts:  Yes.  without intent/plan  Homicidal Thoughts:  No  Memory:  Immediate;   Good Recent;   Fair  Judgement:  Poor  Insight:  Lacking  Psychomotor Activity:  Decreased  Concentration:  Concentration: Fair and Attention Span: Fair  Recall:  [employer] of Knowledge:  Good  Language:  Good  Akathisia:  No  Handed:  Right  AIMS (if indicated):  Assets:  Communication Skills Desire for Improvement Resilience  ADL's:  Intact  Cognition:  WNL  Sleep:       Case discussed with Dr. Darleene Cleaver; recommendations are: Treatment Plan Summary: Daily contact with patient to assess and evaluate symptoms and progress in treatment and Medication management  D home medications for medical conditions Celexa 20 mg a mouth daily for depression. Risperdal 1 mg by mouth daily for mood stabilization Risperdal 3 mg at bedtime daily for mood stabilization  Disposition: Recommend psychiatric Inpatient admission when medically cleared.  Serena Colonel, FNP-BC [place] 08/17/2016 12:50 PM  Patient seen face-to-face for psychiatric evaluation, chart reviewed and case discussed with the physician extender and developed treatment plan.  Reviewed the information documented and agree with the treatment plan. Corena Pilgrim, MD

## 2016-08-17 NOTE — Progress Notes (Signed)
Admission Note:  30 year old male who presents IVC, in no acute distress, for the treatment of SI and Depression. Patient appears flat and depressed. Brightens on approach. Patient was calm and cooperative with admission process. Patient presents with passive SI and contracts for safety upon admission. Patient denies AVH. Patient reports that he recently overdosed on "Trazodone, Depakote, Prozac, and Neurontin" and had to spend multiple nights in the hospital.  Patient states "what's the point (in living)?".  Patient reports multiple stressors to include "recently loss my house" due to bills getting behind, "Been living on the streets for about a week", and "Break up with boyfriend". Patient reports that he does not have any family left and states that everyone is deceased.  Patient reports cocaine use "once a month" and tobacco use of "1/2 pack" per day.  Patient is unsure where he will go upon discharge and states "Me and my ex are trying to work things out".  Patient identifies his godparents and ex boyfriend as his support systems.  [facility], patients goals are to "catch up on my bills", "Figure our where I am going to go when I leave here" and "work things out with my ex".  Skin was assessed and found to be clear of any abnormal marks apart from abrasions to back and scratches to left upper arm. Patient searched and no contraband found, POC and unit policies explained and understanding verbalized. Consents obtained. Food and fluids offered and accepted. Patient had no additional questions or concerns.

## 2016-08-17 NOTE — ED Notes (Addendum)
Pt screaming; pt kicked bedside table aggressively, knocking over food and drink that had been provided; pt continued yelling

## 2016-08-17 NOTE — ED Notes (Signed)
Patient sleeping soundly. Handcuffs removed on previous shift.

## 2016-08-17 NOTE — ED Notes (Signed)
Patient awake and cooperative. Patient eating breakfast.

## 2016-08-17 NOTE — Progress Notes (Signed)
Pt confirms cynthia snider is his infections disease provider and states she provides ALL of his care needs [facility]  ED CM left pt uninsured [place] resources in his personal belonging bag at nursing station  CM spoke with pt who confirms uninsured [place] resident with no pcp.  CM provided written information to assist pt with determining choice for uninsured accepting pcps, discussed the importance of pcp vs EDP services for f/u care, www.needymeds.org, www.goodrx.com, discounted pharmacies and other [place] resources such as [facility] , [facility], affordable care act, financial assistance, uninsured dental services, [place] med assist, DSS and  health department  Provided resources for [place] uninsured accepting pcps like [facility], family medicine at [address], [facility], [facility], local urgent care centers, [facility], [facility] family [facility], general medical clinics, family services of the [facility], [facility] urgent care plus others, medication resources, [facility] out patient pharmacies and housing Provided [facility] contact information

## 2016-08-17 NOTE — Tx Team (Addendum)
Initial Treatment Plan 08/17/2016 9:24 PM Edwin Martinez [id number]    PATIENT STRESSORS: Financial difficulties Loss of housing Marital or family conflict Occupational concerns   PATIENT STRENGTHS: Ability for insight Average or above average intelligence Capable of independent living Communication skills General fund of knowledge   PATIENT IDENTIFIED PROBLEMS: Suicide Risk  Depression  Substance use - alcohol  "I need to catch up on my bills"  "Figure out where I am going to go when I leave here"  "We (his boyfriend and him) got into a fight but are trying to work things out"               DISCHARGE CRITERIA:  Ability to meet basic life and health needs Improved stabilization in mood, thinking, and/or behavior Safe-care adequate arrangements made Verbal commitment to aftercare and medication compliance  PRELIMINARY DISCHARGE PLAN: Attend 12-step recovery group Placement in alternative living arrangements  PATIENT/FAMILY INVOLVEMENT: This treatment plan has been presented to and reviewed with the patient, Edwin Martinez. The patient and family have been given the opportunity to ask questions and make suggestions.  Aurora Maskwyman, Dylann Layne E, RN 08/17/2016, 9:24 PM

## 2016-08-17 NOTE — ED Notes (Signed)
Patient remains calm.  

## 2016-08-17 NOTE — BH Assessment (Signed)
[facility] Assessment Progress Note  Per Thedore MinsMojeed Akintayo, MD, this pt requires psychiatric hospitalization.  Lillia AbedLindsay, RN, [facility]; they will be ready to receive pt at 17:00.  Pt presents under IVC initiated by law enforcement, and upheld by Dr Jannifer FranklinAkintayo, and IVC documents have [facility].  Pt's nurse has been notified, and agrees to call report to [phone].  Pt is to be transported via Patent examinerlaw enforcement.   Doylene Canninghomas Demetrius Barrell, MA Triage Specialist [phone]

## 2016-08-17 NOTE — Progress Notes (Signed)
Did not attend group 

## 2016-08-17 NOTE — ED Provider Notes (Signed)
[facility]-EMERGENCY DEPT Provider Note   CSN: [id number] Arrival date & time: 08/17/16  0212     History   Chief Complaint Chief Complaint  Patient presents with  . Suicidal    HPI Edwin Martinez is a 30 y.o. male.  30 year old male with history of bipolar disorer,schizophrenia, HIV (last CD4 >450) and asthma presents to the Emergency Department under IVC taken out by [employer]. Patient states that he was talking to his ex-boyfriend when [employer] was walking behind him and heard him ask his ex, "What if I just killed myself". He does have a history of suicide attempt, but states "I don't see how this is relevant". Patient adamantly denying SI or suicidal plan. No HI. Patient has been drinking ETOH tonight. No illicit drug use other than marijuana. Patient acutely agitated on arrival, mostly secondary to police presence. Calmed down when these individuals were removed from the exam room.   The history is provided by the patient. No language interpreter was used.    Past Medical History:  Diagnosis Date  . ADHD (attention deficit hyperactivity disorder)   . ADHD (attention deficit hyperactivity disorder) 09/12/2012  . Anxiety   . Asthma   . Bipolar 1 disorder (HCC)   . Bipolar disorder (HCC)   . Epileptic seizures (HCC)   . HIV (human immunodeficiency virus infection) (HCC)   . Hypertension   . Schizophrenia (HCC)   . Seizures [facility](HCC)     Patient Active Problem List   Diagnosis Date Noted  . Cocaine use disorder, mild, abuse 07/27/2016  . Cannabis use disorder, moderate, dependence (HCC) 07/27/2016  . Tobacco use disorder 07/27/2016  . Intentional drug overdose (HCC) 07/18/2016  . Bipolar affective disorder, current episode depressed (HCC) 05/04/2016  . Asthma 05/20/2007  . Human immunodeficiency virus (HIV) disease (HCC) 05/05/2007    Past Surgical History:  Procedure Laterality Date  . DENTAL SURGERY       Home Medications    Prior to Admission medications   Medication Sig  Start Date End Date Taking? Authorizing Provider  abacavir-dolutegravir-lamiVUDine (TRIUMEQ) 600-50-300 MG tablet Take 1 tablet by mouth at bedtime.    Historical Provider, MD  albuterol (PROVENTIL HFA;VENTOLIN HFA) 108 (90 Base) MCG/ACT inhaler Inhale 2 puffs into the lungs every 6 (six) hours as needed for wheezing or shortness of breath. 07/29/16   Jimmy FootmanAndrea Hernandez-Gonzalez, MD  citalopram (CELEXA) 20 MG tablet Take 1 tablet (20 mg total) by mouth daily. 07/29/16   Jimmy FootmanAndrea Hernandez-Gonzalez, MD  risperiDONE (RISPERDAL) 1 MG tablet Take 1 tablet (1 mg total) by mouth daily. 07/29/16   Jimmy FootmanAndrea Hernandez-Gonzalez, MD  risperiDONE (RISPERDAL) 3 MG tablet Take 1 tablet (3 mg total) by mouth at bedtime. 07/29/16   Jimmy FootmanAndrea Hernandez-Gonzalez, MD    Family History Family History  Problem Relation Age of Onset  . Huntington's disease Father   . Heart disease Mother     Social History Social History  Substance Use Topics  . Smoking status: Current Every Day Smoker    Packs/day: 0.50    Types: Cigarettes    Start date: 09/29/1991  . Smokeless tobacco: Never Used  . Alcohol use 1.2 oz/week    2 Standard drinks or equivalent per week     Comment: once week      Allergies   Magnesium-containing compounds; Peanut-containing drug products; Atripla [efavirenz-emtricitab-tenofovir]; Esomeprazole magnesium; Bactrim [sulfamethoxazole-trimethoprim]; and Penicillins   Review of Systems Review of Systems Ten systems reviewed and are negative for acute change, except as noted in  the HPI.    Physical Exam Updated Vital Signs BP 130/65 (BP Location: Left Arm)   Pulse 83   Temp 97.9 F (36.6 C) (Oral)   Resp 14   Ht 5\' 8"  (1.727 m)   Wt 69.9 kg   SpO2 93%   BMI 23.42 kg/m   Physical Exam  Constitutional: He is oriented to person, place, and time. He appears well-developed and well-nourished. No distress.  Nontoxic and in NAD  HENT:  Head: Normocephalic and atraumatic.  Eyes: Conjunctivae and  EOM are normal. No scleral icterus.  Neck: Normal range of motion.  Pulmonary/Chest: Effort normal. No respiratory distress.  Respirations even and unlabored  Musculoskeletal: Normal range of motion.  Neurological: He is alert and oriented to person, place, and time. He exhibits normal muscle tone. Coordination normal.  Skin: Skin is warm and dry. No rash noted. He is not diaphoretic. No erythema. No pallor.  Psychiatric: His affect is angry. His speech is rapid and/or pressured. He is agitated. He expresses no homicidal and no suicidal ideation. He expresses no suicidal plans and no homicidal plans.  Denies SI or plan.  Nursing note and vitals reviewed.    ED Treatments / Results  Labs (all labs ordered are listed, but only abnormal results are displayed) Labs Reviewed  CBC WITH DIFFERENTIAL/PLATELET - Abnormal; Notable for the following:       Result Value   WBC 11.3 (*)    Lymphs Abs 6.6 (*)    All other components within normal limits  COMPREHENSIVE METABOLIC PANEL - Abnormal; Notable for the following:    Potassium 3.4 (*)    CO2 20 (*)    Glucose, Bld 158 (*)    Calcium 8.7 (*)    All other components within normal limits  ETHANOL - Abnormal; Notable for the following:    Alcohol, Ethyl (B) 187 (*)    All other components within normal limits  ACETAMINOPHEN LEVEL - Abnormal; Notable for the following:    Acetaminophen (Tylenol), Serum <10 (*)    All other components within normal limits  SALICYLATE LEVEL  RAPID URINE DRUG SCREEN, HOSP PERFORMED  PATHOLOGIST SMEAR REVIEW    EKG  EKG Interpretation None       Radiology No results found.  Procedures Procedures (including critical care time)  Medications Ordered in ED Medications  ziprasidone (GEODON) 20 MG injection (10 mg  Given 08/17/16 0304)  sterile water (preservative free) injection (  Given 08/17/16 0305)     Initial Impression / Assessment and Plan / ED Course  I have reviewed the triage vital  signs and the nursing notes.  Pertinent labs & imaging results that were available during my care of the patient were reviewed by me and considered in my medical decision making (see chart for details).  Clinical Course     Patient medically cleared. IVC papers taken out by [employer]. [employer] presented with the patient over concern for suicidal ideations. Patient denies any suicidal ideations during my encounter with him. He is intoxicated. Geodon given for agitation. Patient pending AM psych evaluation for recommendations. Disposition to be determined by oncoming ED provider.   Final Clinical Impressions(s) / ED Diagnoses   Final diagnoses:  Alcohol-induced mood disorder [facility](HCC)    New Prescriptions New Prescriptions   No medications on file     Antony MaduraKelly Catcher Dehoyos, PA-C 08/17/16 [id number]    April Palumbo, MD 08/17/16 [phone]

## 2016-08-17 NOTE — ED Notes (Signed)
IVC papers arrived via [employer] 

## 2016-08-17 NOTE — ED Triage Notes (Addendum)
PT BIB [employer]; at time of arrival pt yelling and in 4 point restraints; pt was asked to leave boyfriend's property which he refused to do; boyfriend called 911; pt then made SI and HI comments to police; ETOH on board

## 2016-08-17 NOTE — ED Notes (Signed)
Pt standing and yelling; pt threw bedside table into wall

## 2016-08-18 ENCOUNTER — Telehealth: Payer: Self-pay

## 2016-08-18 ENCOUNTER — Encounter (HOSPITAL_COMMUNITY): Payer: Self-pay | Admitting: Psychiatry

## 2016-08-18 DIAGNOSIS — Z91010 Allergy to peanuts: Secondary | ICD-10-CM

## 2016-08-18 DIAGNOSIS — Z888 Allergy status to other drugs, medicaments and biological substances status: Secondary | ICD-10-CM

## 2016-08-18 DIAGNOSIS — Z880 Allergy status to penicillin: Secondary | ICD-10-CM

## 2016-08-18 DIAGNOSIS — Z9889 Other specified postprocedural states: Secondary | ICD-10-CM

## 2016-08-18 DIAGNOSIS — F314 Bipolar disorder, current episode depressed, severe, without psychotic features: Secondary | ICD-10-CM | POA: Diagnosis present

## 2016-08-18 MED ORDER — TRAZODONE HCL 50 MG PO TABS
50.0000 mg | ORAL_TABLET | Freq: Every evening | ORAL | Status: DC | PRN
  Administered 2016-08-18 – 2016-08-21 (×3): 50 mg via ORAL
  Filled 2016-08-18: qty 1
  Filled 2016-08-18: qty 7
  Filled 2016-08-18 (×2): qty 1

## 2016-08-18 MED ORDER — OLANZAPINE 10 MG IM SOLR: 10.0000 mg | Freq: Three times a day (TID) | INTRAMUSCULAR | Status: DC | PRN

## 2016-08-18 MED ORDER — HYDROXYZINE HCL 25 MG PO TABS
25.0000 mg | ORAL_TABLET | Freq: Four times a day (QID) | ORAL | Status: DC | PRN
  Filled 2016-08-18: qty 10

## 2016-08-18 MED ORDER — BENZTROPINE MESYLATE 1 MG PO TABS
1.0000 mg | ORAL_TABLET | Freq: Every day | ORAL | Status: DC
  Administered 2016-08-18 – 2016-08-21 (×4): 1 mg via ORAL
  Filled 2016-08-18 (×7): qty 1

## 2016-08-18 MED ORDER — OLANZAPINE 10 MG PO TABS: 10.0000 mg | ORAL_TABLET | Freq: Three times a day (TID) | ORAL | Status: DC | PRN

## 2016-08-18 MED ORDER — CHLORDIAZEPOXIDE HCL 25 MG PO CAPS: 25.0000 mg | ORAL_CAPSULE | Freq: Three times a day (TID) | ORAL | Status: DC | PRN

## 2016-08-18 NOTE — [facility] Group Notes (Signed)
Pt did not attend group meeting this evening.  

## 2016-08-18 NOTE — Plan of Care (Signed)
Problem: Education: Goal: Knowledge of [facility] Education information/materials will improve Outcome: Progressing Admitted with information on unit and meds

## 2016-08-18 NOTE — Progress Notes (Signed)
Nursing Progress Note: 7*-7* D: Pt currently presents with a pleasant/sad affect and behavior. Pt reports to Clinical research associatewriter that their goal is to "work on my relationship with my boyfriend." Pt states "i'm here because I got evicted from my house and I broke up with my boyfriend, so I wanted to kill myself, so here I am." Pt reports good sleep with current medication regimen.   A: Pt provided with medications per providers orders. Pt's labs and vitals were monitored throughout the night. Pt supported emotionally and encouraged to express concerns and questions. Pt educated on medications.  R: Pt's safety ensured with 15 minute and environmental checks. Pt currently denies SI/HI/Self Harm and A/V hallucinations. Pt verbally agrees to seek staff if SI/HI or A/VH occurs and to consult with staff before acting on any harmful thoughts. Will continue POC.

## 2016-08-18 NOTE — Progress Notes (Signed)
Recreation Therapy Notes  INPATIENT RECREATION THERAPY ASSESSMENT  Patient Details Name: Edwin Martinez MRN: [id number] DOB: Dec 16, 1985 Today's Date: 08/18/2016  Patient Stressors: Other (Comment) (Bills)  Pt stated he was here for suicidal thoughts.  Coping Skills:   Exercise, Talking, Music  Personal Challenges: Anger  Leisure Interests (2+):  Music - Listen, Music - Other (Comment) (Dance)  Awareness of Community Resources:  Yes  Community Resources:  [facility]  Current Use: Yes  Patient Strengths:  Appearance, personality  Patient Identified Areas of Improvement:  "I don't really know"  Current Recreation Participation:  Once or twice a week  Patient Goal for Hospitalization:  "To get and stay on medication"  [place]  [place]idence[place]   Current [place] (including self-harm):  No  Current HI:  No  Consent to Intern Participation: N/A  Caroll RancherMarjette Beau Vanduzer, LRT/CTRS  Caroll RancherLindsay, Elinore Shults A 08/18/2016, 3:05 PM

## 2016-08-18 NOTE — [facility] Counselor (Signed)
Adult Comprehensive Assessment  Patient ID: [name] A Pingley, male   DOB: [birth year], 30 y.o.   MRN: [id number]   Information Source: Information source: Patient  Current Stressors:  Educational / Learning stressors: n/a Employment / Job issues: Pt is unemployed and has a pending disability claim. Family Relationships: Pt states "I don't have any family and my only brother is incarceratedEngineer, petroleum" Financial / Lack of resources (include bankruptcy): [place] pay rent and lights.  However, he has been using the check for other things and his electricity has been cut off Housing / Lack of housing: Says he cannot return home due to no heat Physical health (include injuries & life threatening diseases): HIV Social relationships: In relationship Substance abuse: Patient denies  Bereavement / Loss: n/a  Living/Environment/Situation:  Living Arrangements: Alone, homeless Living conditions (as described by patient or guardian): "I can't go back to my aprtmant until I figure out a way to get me lights and heat cut back on." How long has patient lived in current situation?: year and a half What is atmosphere in current home: temporary  Family History:  Marital status: Single-in committed relationship Are you sexually active?: Yes What is your sexual orientation?: homosexual Has your sexual activity been affected by drugs, alcohol, medication, or emotional stress?: n/a Does patient have children?: No  Childhood History:  By whom was/is the patient raised?: Mother/father and step-parent Additional childhood history information: Patient reports having a good childhood Description of patient's relationship with caregiver when they were a child: Good relationship with parents as a child Patient's description of current relationship with people who raised him/her: fine How were you disciplined when you got in trouble as a child/adolescent?: n/a Does  patient have siblings?: Yes Number of Siblings: 1 Description of patient's current relationship with siblings: Good; close relationship that is supportive, brother is incarcerated Did patient suffer from severe childhood neglect?: No Has patient ever been sexually abused/assaulted/raped as an adolescent or adult?: No Was the patient ever a victim of a crime or a disaster?: No Has patient been effected by domestic violence as an adult?: No  Education:  Highest grade of school patient has completed: 8th grade Currently a student?: No Learning disability?: No  Employment/Work Situation:   Employment situation: Unemployed Patient's job has been impacted by current illness: No What is the longest time patient has a held a job?: Six months Where was the patient employed at that time?: [employer] Has patient ever been in the Eli Lilly and Companymilitary?: No Has patient ever served in combat?: No Did You Receive Any Psychiatric Treatment/Services While in Equities traderthe Military?: No Are There Guns or Education officer, communityther Weapons in Your Home?: No Are These ComptrollerWeapons Safely Secured?:  (n/a)  Financial Resources:   Surveyor, quantityinancial resources: No income, Sales executiveood stamps, Income from spouse Does patient have a Lawyerrepresentative payee or guardian?: No  Alcohol/Substance Abuse:   What has been your use of drugs/alcohol within the last 12 months?: Cocaine "every once in awhile" If attempted suicide, did drugs/alcohol play a role in this?: No Alcohol/Substance Abuse Treatment Hx: Denies past history Has alcohol/substance abuse ever caused legal problems?: No  Social Support System:   Conservation officer, natureatient's Community Support System: Production assistant, radioGood Describe Community Support System: Boyfriend, Financial plannerGodfather, and foster mom Type of faith/religion: n/a How does patient's faith help to cope with current illness?: n/a  Leisure/Recreation:   Leisure and Hobbies: Gardening, cooking, poetry and singing  Strengths/Needs:   What things does the patient do well?: communication  and positive outlook In what areas does patient struggle / problems for patient: anxiety and depression  Discharge Plan:   Does patient have access to transportation?: Yes  Will patient be returning to same living situation after discharge?: No  States he will [facility]im[facility] Need[facility]Michael [facility] to get in there Currently receiving community mental health services: Yes (From Whom) ([facility]) Does patient have financial barriers related to discharge medications?: Yes   Patient description of barriers related to discharge medications:No income, no insurance    Summary/Recommendations:   Summary and Recommendations (to be completed by the evaluator): [name] "[name]" is a 30 YO AA male diagnosed with Bipolar D/O, severe, depressed without psychosis.  He presents depressed with accompanying SI.  [name] cites stressor of having no electricity nor heat in his [address] apartment.  Apparently he has been using the check they have been sending him to pay the utilities on other necessities.  He states he will likely [facility] at d/c since it is too cold to stay at his current residence.  He cites the support of a SO. [name] can benefit from crises stabilization, medication management, therapeutic milieu and referral for services.  Ida Rogueodney B Nyna Chilton. 08/18/2016

## 2016-08-18 NOTE — Progress Notes (Signed)
Patient ID: Edwin Martinez, male   DOB: [birth year], 30 y.o.   MRN: [id number] D: Client seen in dayroom for a short period watching TV, but resorts to his room to read the rest of the evening. Client reports "I always feel better after I come here" client currently denies SHI. Client reports he had "sort of" been of his medication for a little while. A: Clinical research associatewriter provided emotional support encouraged client to report any concerns. Medications reviewed, administered as ordered. Staff will monitor q715min for safety. R: Client is safe on the unit.

## 2016-08-18 NOTE — Telephone Encounter (Signed)
Behavioral Health is calling to set up appointment for patient to return to care.   Appointment given.   Pt will need bridge counselor assignment.   Laurell Josephsammy K King, RN

## 2016-08-18 NOTE — Progress Notes (Signed)
DAR NOTE: Patient presents with pleasant affect and depressed mood.  Denies pain, auditory and visual hallucinations.  Rates depression at 5, hopelessness at 5, and anxiety at 8.  Described energy level as normal and concentration as good.  Maintained on routine safety checks.  Medications given as prescribed.  Support and encouragement offered as needed.  Attended group and participated.  States goal for today is "staying on medication."  Patient observed socializing with peers in the dayroom.  Offered no complaint.

## 2016-08-18 NOTE — Progress Notes (Signed)
Recreation Therapy Notes  Date: 08/18/16 Time: 1000 Location: 500 Hall Dayroom  Group Topic: Coping Skills  Goal Area(s) Addresses:  Pt will be able to identify positive coping skills. Pt will be able to identify the benefits of using coping skills post d/c.  Intervention: Can with coping skills and various words written on paper  Activity: Coping Skills Pictionary.  Patients picked a piece of paper from the can with a word on it.  Patient was to draw what was on the strip of paper on the board.  The remainder of the group had to try and guess what the person was drawing.  The person that guesses the picture will get the next turn.   Education: Coping Skills, Discharge Planning.   Education Outcome: Acknowledges understanding/In group clarification offered/Needs additional education.   Clinical Observations/Feedback: Pt did not attend group.    [name], LRT/CTRS         Edwin Martinez A 08/18/2016 12:08 PM 

## 2016-08-18 NOTE — [facility] Group Notes (Signed)
[facility] LCSW Group Therapy  08/18/2016 , 4:47 PM   Type of Therapy:  Group Therapy  Participation Level:  Active  Participation Quality:  Attentive  Affect:  Appropriate  Cognitive:  Alert  Insight:  Improving  Engagement in Therapy:  Engaged  Modes of Intervention:  Discussion, Exploration and Socialization  Summary of Progress/Problems: Today's group focused on the term Diagnosis.  Participants were asked to define the term, and then pronounce whether it is a negative, positive or neutral term. Invited.  Chose to not attend.  Daryel Geraldorth, Demarie Uhlig B 08/18/2016 , 4:47 PM

## 2016-08-18 NOTE — BHH Suicide Risk Assessment (Signed)
[facility]   Nursing information obtained from:  Patient Demographic factors:  Male, Cardell PeachGay, lesbian, or bisexual orientation, Low socioeconomic status, Living alone Current Mental Status:  Suicidal ideation indicated by patient, Suicide plan, Self-harm thoughts, Self-harm behaviors Loss Factors:  Loss of significant relationship, Financial problems / change in socioeconomic status Historical Factors:  Prior suicide attempts, Family history of suicide, Family history of mental illness or substance abuse Risk Reduction Factors:  Positive social support  Total Time spent with patient: 30 minutes Principal Problem: Bipolar I disorder, most recent episode depressed, severe without psychotic features (HCC) Diagnosis:   Patient Active Problem List   Diagnosis Date Noted  . Bipolar I disorder, most recent episode depressed, severe without psychotic features (HCC) [F31.4] 08/18/2016  . Cocaine use disorder, mild, abuse [F14.10] 07/27/2016  . Cannabis use disorder, moderate, dependence (HCC) [F12.20] 07/27/2016  . Tobacco use disorder [F17.200] 07/27/2016  . Intentional drug overdose (HCC) [T50.902A] 07/18/2016  . Asthma [J45.909] 05/20/2007  . Human immunodeficiency virus (HIV) disease (HCC) [B20] 05/05/2007   Subjective Data: Please see H&P.   Continued Clinical Symptoms:  Alcohol Use Disorder Identification Test Final Score (AUDIT): 3 The "Alcohol Use Disorders Identification Test", Guidelines for Use in Primary Care, Second Edition.  World Science writerHealth Organization [facility](WHO). Score between 0-7:  no or low risk or alcohol related problems. Score between 8-15:  moderate risk of alcohol related problems. Score between 16-19:  high risk of alcohol related problems. Score 20 or above:  warrants further diagnostic evaluation for alcohol dependence and treatment.   CLINICAL FACTORS:   Bipolar Disorder:   Depressive phase Unstable or Poor Therapeutic Relationship Previous  Psychiatric Diagnoses and Treatments Medical Diagnoses and Treatments/Surgeries   Musculoskeletal: Strength & Muscle Tone: within normal limits Gait & Station: normal Patient leans: N/A  Psychiatric Specialty Exam: Physical Exam  ROS  Blood pressure 122/82, pulse 78, temperature 97.8 F (36.6 C), temperature source Oral, resp. rate 18, height 5\' 7"  (1.702 m), weight 67.6 kg (149 lb), SpO2 100 %.Body mass index is 23.34 kg/m.      Please see H&P.                                                     COGNITIVE FEATURES THAT CONTRIBUTE TO RISK:  Closed-mindedness, Polarized thinking and Thought constriction (tunnel vision)    SUICIDE RISK:   Severe:  Frequent, intense, and enduring suicidal ideation, specific plan, no subjective intent, but some objective markers of intent (i.e., choice of lethal method), the method is accessible, some limited preparatory behavior, evidence of impaired self-control, severe dysphoria/symptomatology, multiple risk factors present, and few if any protective factors, particularly a lack of social support.   PLAN OF CARE: Please see H&P.   I certify that inpatient services furnished can reasonably be expected to improve the patient's condition.  Mannat Benedetti, MD 08/18/2016, 11:29 AM

## 2016-08-18 NOTE — H&P (Signed)
Psychiatric Admission Assessment Adult  Patient Identification: Edwin Martinez MRN:  [id number] Date of Evaluation:  08/18/2016 Chief Complaint: Pt states " I am depressed, its a lot of things going on."   Principal Diagnosis: Bipolar I disorder, most recent episode depressed, severe without psychotic features ([place]) Diagnosis:   Patient Active Problem List   Diagnosis Date Noted  . Bipolar I disorder, most recent episode depressed, severe without psychotic features ([place]) [F31.4] 08/18/2016  . Cocaine use disorder, mild, abuse [F14.10] 07/27/2016  . Cannabis use disorder, moderate, dependence ([place]) [F12.20] 07/27/2016  . Tobacco use disorder [F17.200] 07/27/2016  . Intentional drug overdose ([place]) [T50.902A] 07/18/2016  . Asthma [J45.909] 05/20/2007  . Human immunodeficiency virus (HIV) disease ([place]) [B20] 05/05/2007   History of Present Illness: Edwin Martinez is a 30 y.o. male, who is single , unemployed , lives in [place] by self , has a hx of Bipolar disorder, HIV , who has had multiple suicide attempts , recently 3 weeks ago , who presented with worsening SI with plan to [facility] under IVC .  Per initial notes in EHR : 'Pt restrained to bed via handcuffs and medicated due to disruptive aggressive behaviors. Pt presents to hospital via [facility] after stating to boyfriend during argument "what if I kill myself". Pt reports verbalizing question but, denies Suicidal ideation, plan, and intent. Pt reports history of suicide attempt. Pt denies homicidal ideation and thoughts of harm towards others.  Pt reports being followed by [facility] for OPT services. Pt states he is not aware if he is prescribed any psychiatric medications at this time. Pt denies history of abuse.Per chart- pt was recently discharged from [facility] (10.27.17-11.1.17) after treatment for bipolar d/o and suicide attempt (50 tablets of Depakote, 30 tablets ofNeurontin, 20 trazodone, 15-20 Prozac and 15-20  Naproxen)."   Patient seen and chart reviewed today .Discussed patient with treatment team. Patient today seen as depressed, reports worsening mood since the past 2 weeks. Pt reports lots of stressors- has financial issues, is about to lose his housing , has medical issues - HIV, bipolar do , has relational conflicts . Pt reports he has sadness all day, loss of energy , loss of sleep as well as SI with plan prior to admission to jump in front of a car. Pt reports a hx of manic episodes in the past. Pt reports he also has lots of anxiety about his current stressors , worries a lot - ongoing since the past several weeks. Pt reports he attempted suicide 3 weeks ago as noted above by OD on pills - was admitted at [facility]. Pt reports that his sx did not improve and he also was not really compliant on medications on discharge. Pt reports that risperidone has been effective when he takes it and is OK with celexa.Discussed adding a mood stabilizer- agrees to lamictal.Pt reports he tried depakote in the past - but he OD sed and it was discontinued. Pt reports trazodone helps with sleep , that was also discontinued due to him OD sing on it in the past.  Pt reports hx of cannabis as well as cocaine as well as alcohol use in the past - does not think he abuses them.  Pt has a hx of HIV - is currently on medications - reports he is compliant.   Associated Signs/Symptoms: Depression Symptoms:  depressed mood, anhedonia, insomnia, psychomotor retardation, fatigue, feelings of worthlessness/guilt, difficulty concentrating, hopelessness, suicidal thoughts with specific plan, suicidal attempt, anxiety, (Hypo) Manic Symptoms:  Impulsivity, Anxiety Symptoms:  Excessive Worry, Psychotic Symptoms:  denies PTSD Symptoms: Negative Total Time spent with patient: 45 minutes  Past Psychiatric History: Hx of Bipolar disorder , was admitted to [facility] in the past , hx of [facility] , [place] IP admissions , hx of atleast 3  suicide attempts - most recent by OD end of October 2017. Pt reports he [facility] [place].  Is the patient at risk to self? Yes.    Has the patient been a risk to self in the past 6 months? Yes.    Has the patient been a risk to self within the distant past? Yes.    Is the patient a risk to others? No.  Has the patient been a risk to others in the past 6 months? No.  Has the patient been a risk to others within the distant past? No.   Prior Inpatient Therapy:   Prior Outpatient Therapy:    Alcohol Screening: 1. How often do you have a drink containing alcohol?: 2 to 3 times a week 2. How many drinks containing alcohol do you have on a typical day when you are drinking?: 1 or 2 3. How often do you have six or more drinks on one occasion?: Never Preliminary Score: 0 4. How often during the last year have you found that you were not able to stop drinking once you had started?: Never 5. How often during the last year have you failed to do what was normally expected from you becasue of drinking?: Never 6. How often during the last year have you needed a first drink in the morning to get yourself going after a heavy drinking session?: Never 7. How often during the last year have you had a feeling of guilt of remorse after drinking?: Never 8. How often during the last year have you been unable to remember what happened the night before because you had been drinking?: Never 9. Have you or someone else been injured as a result of your drinking?: No 10. Has a relative or friend or a doctor or another health worker been concerned about your drinking or suggested you cut down?: No Alcohol Use Disorder Identification Test Final Score (AUDIT): 3 Brief Intervention: AUDIT score less than 7 or less-screening does not suggest unhealthy drinking-brief intervention not indicated Substance Abuse History in the last 12 months:  Yes.  minimizes it - denies it as abuse - but per EHR - hx  of cocaine, cannabis , also UDS - pos for cocaine, THC, BAL - 187 Consequences of Substance Abuse: Negative Previous Psychotropic Medications: Yes depakote , trazodone , prozac  Psychological Evaluations: No  Past Medical History:  Past Medical History:  Diagnosis Date  . ADHD (attention deficit hyperactivity disorder)   . ADHD (attention deficit hyperactivity disorder) 09/12/2012  . Anxiety   . Asthma   . Bipolar 1 disorder ([place])   . Bipolar disorder ([place])   . Epileptic seizures ([place])   . HIV (human immunodeficiency virus infection) ([place])   . Hypertension   . Schizophrenia ([place])   . Seizures ([place])     Past Surgical History:  Procedure Laterality Date  . DENTAL SURGERY     Family History:  Family History  Problem Relation Age of Onset  . Huntington's disease Father   . Heart disease Mother   . Suicidality Maternal Uncle   . Suicidality Maternal Grandmother    Family Psychiatric  History:Please see above - several family members  committed suicide. Tobacco Screening: Have you used any form of tobacco in the last 30 days? (Cigarettes, Smokeless Tobacco, Cigars, and/or Pipes): Yes Tobacco use, Select all that apply: 5 or more cigarettes per day Are you interested in Tobacco Cessation Medications?: No, patient refused Counseled patient on smoking cessation including recognizing danger situations, developing coping skills and basic information about quitting provided: Yes Social History:  History  Alcohol Use  . 1.2 oz/week  . 2 Standard drinks or equivalent per week    Comment: once week      History  Drug Use  . Frequency: 2.0 times per week  . Types: Marijuana, Cocaine    Comment: every other day     Additional Social History:                           Allergies:   Allergies  Allergen Reactions  . Magnesium-Containing Compounds Other (See Comments)    This medication is contraindicated with pts HIV meds.    . Peanut-Containing Drug Products  Anaphylaxis  . Atripla [Efavirenz-Emtricitab-Tenofovir] Other (See Comments)    Reaction:  Suicidal thoughts   . Esomeprazole Magnesium Cough  . Bactrim [Sulfamethoxazole-Trimethoprim] Rash  . Penicillins Rash and Other (See Comments)    Has patient had a PCN reaction causing immediate rash, facial/tongue/throat swelling, SOB or lightheadedness with hypotension: Yes Has patient had a PCN reaction causing severe rash involving mucus membranes or skin necrosis: No Has patient had a PCN reaction that required hospitalization No Has patient had a PCN reaction occurring within the last 10 years: No If all of the above answers are "NO", then may proceed with Cephalosporin use.   Lab Results:  Results for orders placed or performed during the hospital encounter of 08/17/16 (from the past 48 hour(s))  CBC with Differential     Status: Abnormal   Collection Time: 08/17/16  3:05 AM  Result Value Ref Range   WBC 11.3 (H) 4.0 - 10.5 K/uL   RBC 4.59 4.22 - 5.81 MIL/uL   Hemoglobin 13.6 13.0 - 17.0 g/dL   HCT 39.6 39.0 - 52.0 %   MCV 86.3 78.0 - 100.0 fL   MCH 29.6 26.0 - 34.0 pg   MCHC 34.3 30.0 - 36.0 g/dL   RDW 13.5 11.5 - 15.5 %   Platelets 303 150 - 400 K/uL   Neutrophils Relative % 31 %   Lymphocytes Relative 58 %   Monocytes Relative 8 %   Eosinophils Relative 3 %   Basophils Relative 0 %   Neutro Abs 3.5 1.7 - 7.7 K/uL   Lymphs Abs 6.6 (H) 0.7 - 4.0 K/uL   Monocytes Absolute 0.9 0.1 - 1.0 K/uL   Eosinophils Absolute 0.3 0.0 - 0.7 K/uL   Basophils Absolute 0.0 0.0 - 0.1 K/uL   WBC Morphology ABSOLUTE LYMPHOCYTOSIS   Comprehensive metabolic panel     Status: Abnormal   Collection Time: 08/17/16  3:05 AM  Result Value Ref Range   Sodium 141 135 - 145 mmol/L   Potassium 3.4 (L) 3.5 - 5.1 mmol/L   Chloride 111 101 - 111 mmol/L   CO2 20 (L) 22 - 32 mmol/L   Glucose, Bld 158 (H) 65 - 99 mg/dL   BUN 10 6 - 20 mg/dL   Creatinine, Ser 0.95 0.61 - 1.24 mg/dL   Calcium 8.7 (L) 8.9 - 10.3  mg/dL   Total Protein 7.4 6.5 - 8.1 g/dL   Albumin 4.3  3.5 - 5.0 g/dL   AST 38 15 - 41 U/L   ALT 23 17 - 63 U/L   Alkaline Phosphatase 77 38 - 126 U/L   Total Bilirubin 0.3 0.3 - 1.2 mg/dL   GFR calc non Af Amer >60 >60 mL/min   GFR calc Af Amer >60 >60 mL/min    Comment: (NOTE) The eGFR has been calculated using the CKD EPI equation. This calculation has not been validated in all clinical situations. eGFR's persistently <60 mL/min signify possible Chronic Kidney Disease.    Anion gap 10 5 - 15  Ethanol     Status: Abnormal   Collection Time: 08/17/16  3:05 AM  Result Value Ref Range   Alcohol, Ethyl (B) 187 (H) <5 mg/dL    Comment:        LOWEST DETECTABLE LIMIT FOR SERUM ALCOHOL IS 5 mg/dL FOR MEDICAL PURPOSES ONLY   Acetaminophen level     Status: Abnormal   Collection Time: 08/17/16  3:05 AM  Result Value Ref Range   Acetaminophen (Tylenol), Serum <10 (L) 10 - 30 ug/mL    Comment:        THERAPEUTIC CONCENTRATIONS VARY SIGNIFICANTLY. A RANGE OF 10-30 ug/mL MAY BE AN EFFECTIVE CONCENTRATION FOR MANY PATIENTS. HOWEVER, SOME ARE BEST TREATED AT CONCENTRATIONS OUTSIDE THIS RANGE. ACETAMINOPHEN CONCENTRATIONS >150 ug/mL AT 4 HOURS AFTER INGESTION AND >50 ug/mL AT 12 HOURS AFTER INGESTION ARE OFTEN ASSOCIATED WITH TOXIC REACTIONS.   Salicylate level     Status: None   Collection Time: 08/17/16  3:05 AM  Result Value Ref Range   Salicylate Lvl <7.4 2.8 - 30.0 mg/dL  Pathologist smear review     Status: None   Collection Time: 08/17/16  3:05 AM  Result Value Ref Range   Path Review Reviewed by Kalman Drape, M.D.     Comment: 11.20.17 MILD LEUKOCYTOSIS WITH ATYPICAL LYMPHOCYTOSIS.   Ammonia     Status: Abnormal   Collection Time: 08/17/16 11:53 AM  Result Value Ref Range   Ammonia 48 (H) 9 - 35 umol/L  Rapid urine drug screen (hospital performed)     Status: Abnormal   Collection Time: 08/17/16 12:00 PM  Result Value Ref Range   Opiates NONE DETECTED NONE  DETECTED   Cocaine POSITIVE (A) NONE DETECTED   Benzodiazepines NONE DETECTED NONE DETECTED   Amphetamines NONE DETECTED NONE DETECTED   Tetrahydrocannabinol POSITIVE (A) NONE DETECTED   Barbiturates NONE DETECTED NONE DETECTED    Comment:        DRUG SCREEN FOR MEDICAL PURPOSES ONLY.  IF CONFIRMATION IS NEEDED FOR ANY PURPOSE, NOTIFY LAB WITHIN 5 DAYS.        LOWEST DETECTABLE LIMITS FOR URINE DRUG SCREEN Drug Class       Cutoff (ng/mL) Amphetamine      1000 Barbiturate      200 Benzodiazepine   081 Tricyclics       448 Opiates          300 Cocaine          300 THC              50     Blood Alcohol level:  Lab Results  Component Value Date   ETH 187 (H) 08/17/2016   ETH <5 18/56/3149    Metabolic Disorder Labs:  Lab Results  Component Value Date   HGBA1C 5.5 05/04/2016   MPG 111 05/04/2016   No results found for: PROLACTIN Lab Results  Component Value  Date   CHOL 136 02/05/2015   TRIG 147 02/05/2015   HDL 54 02/05/2015   CHOLHDL 2.5 02/05/2015   VLDL 29 02/05/2015   LDLCALC 53 02/05/2015   LDLCALC 45 02/21/2014    Current Medications: Current Facility-Administered Medications  Medication Dose Route Frequency Provider Last Rate Last Dose  . abacavir-dolutegravir-lamiVUDine (TRIUMEQ) [phone] MG per tablet 1 tablet  1 tablet Oral QHS Lurena Nida, NP   1 tablet at 08/17/16 2346  . acetaminophen (TYLENOL) tablet 650 mg  650 mg Oral Q6H PRN Lurena Nida, NP      . albuterol (PROVENTIL HFA;VENTOLIN HFA) 108 (90 Base) MCG/ACT inhaler 2 puff  2 puff Inhalation Q6H PRN Lurena Nida, NP      . citalopram (CELEXA) tablet 20 mg  20 mg Oral Daily Lurena Nida, NP   20 mg at 08/18/16 0739  . risperiDONE (RISPERDAL) tablet 1 mg  1 mg Oral Daily Lurena Nida, NP   1 mg at 08/18/16 0739  . risperiDONE (RISPERDAL) tablet 3 mg  3 mg Oral QHS Lurena Nida, NP   3 mg at 08/17/16 2346   PTA Medications: Prescriptions Prior to Admission  Medication Sig Dispense Refill  Last Dose  . abacavir-dolutegravir-lamiVUDine (TRIUMEQ) 600-50-300 MG tablet Take 1 tablet by mouth at bedtime.   Past Week at Unknown time  . albuterol (PROVENTIL HFA;VENTOLIN HFA) 108 (90 Base) MCG/ACT inhaler Inhale 2 puffs into the lungs every 6 (six) hours as needed for wheezing or shortness of breath. 1 Inhaler 0 unknown  . citalopram (CELEXA) 20 MG tablet Take 1 tablet (20 mg total) by mouth daily. 30 tablet 0 08/16/2016 at Unknown time  . risperiDONE (RISPERDAL) 1 MG tablet Take 1 tablet (1 mg total) by mouth daily. 30 tablet 0 08/16/2016 at Unknown time  . risperiDONE (RISPERDAL) 3 MG tablet Take 1 tablet (3 mg total) by mouth at bedtime. 30 tablet 0 08/16/2016 at Unknown time    Musculoskeletal: Strength & Muscle Tone: within normal limits Gait & Station: normal Patient leans: N/A  Psychiatric Specialty Exam: Physical Exam  Review of Systems  Psychiatric/Behavioral: Positive for depression and suicidal ideas. The patient is nervous/anxious and has insomnia.   All other systems reviewed and are negative.   Blood pressure 122/82, pulse 78, temperature 97.8 F (36.6 C), temperature source Oral, resp. rate 18, height 5' 7"  (1.702 m), weight 67.6 kg (149 lb), SpO2 100 %.Body mass index is 23.34 kg/m.  General Appearance: Guarded  Eye Contact:  Fair  Speech:  Normal Rate  Volume:  Decreased  Mood:  Anxious, Depressed and Hopeless  Affect:  Depressed  Thought Process:  Goal Directed and Descriptions of Associations: Intact  Orientation:  Full (Time, Place, and Person)  Thought Content:  Rumination  Suicidal Thoughts:  Yes.  with intent/plan contracts for safety on the unit  Homicidal Thoughts:  No  Memory:  Immediate;   Fair Recent;   Fair Remote;   Fair  Judgement:  Impaired  Insight:  Shallow  Psychomotor Activity:  Normal  Concentration:  Concentration: Fair and Attention Span: Fair  Recall:  [employer] of Knowledge:  Fair  Language:  Fair  Akathisia:  No  Handed:   Right  AIMS (if indicated):     Assets:  Desire for Improvement  ADL's:  Intact  Cognition:  WNL  Sleep:  Number of Hours: 6.75    Treatment Plan Summary:Patient today seen as depressed , had recent SI with  plan - will restart medications , observe on the unit.  Daily contact with patient to assess and evaluate symptoms and progress in treatment and Medication management Patient will benefit from inpatient treatment and stabilization.   Estimated length of stay is 5-7 days.   Reviewed past medical records,treatment plan.   For mood sx: Will continue Risperidone 1 mg po daily and 3 mg po qhs. Will add Lamictal 25 mg po daily. Will contiue Celexa 20 mg po daily - home dose.  For insomnia: Will start Trazodone 50 mg po qhs prn.  For anxiety/agitation: Will start PRN medications as per MAR.  For HIV: Continue Triumeq PO daily at bedtime as scheduled. Labs reviewed- 07/18/16 CD4 T Cell Abs 400 - 2,700 /uL 2,020   CD4 % Helper T Cell 33 - 55 %     Will continue to monitor vitals ,medication compliance and treatment side effects while patient is here.   Will monitor for medical issues as well as call consult as needed.   Reviewed labs- tsh - 05/04/16- wnl, hba1c- 05/04/16- wnl ,cbc - wnl, UDS- pos for cocaine, THC, BAL - 187 ,CMp - shows k+ low - will repeat ,  will get lipid panel, pl .  I have reviewed EKG for qtc - wnl - 07/19/16.  CSW will start working on disposition.   Patient to participate in therapeutic milieu .      Observation Level/Precautions:  15 minute checks    Psychotherapy:  Individual and group therapy     Consultations:  CSW/RT  Discharge Concerns:Stability and safety         Physician Treatment Plan for Primary Diagnosis: Bipolar I disorder, most recent episode depressed, severe without psychotic features ([place]) Long Term Goal(s): Improvement in symptoms so as ready for discharge  Short Term Goals: Ability to disclose and discuss suicidal ideas,  Compliance with prescribed medications will improve and Ability to identify triggers associated with substance abuse/mental health issues will improve  Physician Treatment Plan for Secondary Diagnosis: Principal Problem:   Bipolar I disorder, most recent episode depressed, severe without psychotic features ([place]) Active Problems:   Cocaine use disorder, mild, abuse   Tobacco use disorder  Long Term Goal(s): Improvement in symptoms so as ready for discharge  Short Term Goals: Ability to disclose and discuss suicidal ideas, Compliance with prescribed medications will improve and Ability to identify triggers associated with substance abuse/mental health issues will improve  I certify that inpatient services furnished can reasonably be expected to improve the patient's condition.    Sherrica Niehaus, MD 11/21/201711:30 AM

## 2016-08-19 LAB — COMPREHENSIVE METABOLIC PANEL
ALBUMIN: 3.8 g/dL (ref 3.5–5.0)
ALK PHOS: 62 U/L (ref 38–126)
ALT: 23 U/L (ref 17–63)
AST: 25 U/L (ref 15–41)
Anion gap: 7 (ref 5–15)
BUN: 12 mg/dL (ref 6–20)
CALCIUM: 8.9 mg/dL (ref 8.9–10.3)
CHLORIDE: 108 mmol/L (ref 101–111)
CO2: 23 mmol/L (ref 22–32)
CREATININE: 0.87 mg/dL (ref 0.61–1.24)
GFR calc non Af Amer: >60 mL/min (ref >60)
GLUCOSE: 86 mg/dL (ref 65–99)
Potassium: 3.8 mmol/L (ref 3.5–5.1)
SODIUM: 138 mmol/L (ref 135–145)
Total Bilirubin: 0.5 mg/dL (ref 0.3–1.2)
Total Protein: 6.8 g/dL (ref 6.5–8.1)

## 2016-08-19 LAB — LIPID PANEL
Cholesterol: 140 mg/dL (ref 0–200)
HDL: 50 mg/dL (ref >40)
LDL CALC: 75 mg/dL (ref 0–99)
Total CHOL/HDL Ratio: 2.8 RATIO
Triglycerides: 73 mg/dL (ref <150)
VLDL: 15 mg/dL (ref 0–40)

## 2016-08-19 NOTE — Progress Notes (Signed)
DAR NOTE: Pt present with flat affect and depressed mood in the unit. Pt has been isolating himself and has been bed most of the time. Pt denies physical pain, took all his meds as scheduled. As per self inventory, pt had a good night sleep, good appetite, normal energy, and good concentration. Pt rate depression at 0, hopeless ness at 0. Pt's safety ensured with 15 minute and environmental checks. Pt currently denies SI/HI and A/V hallucinations. Pt verbally agrees to seek staff if SI/HI or A/VH occurs and to consult with staff before acting on these thoughts. Will continue POC. 

## 2016-08-19 NOTE — Progress Notes (Signed)
[facility] MD Progress Note  08/19/2016 10:40 AM Edwin Martinez  MRN:  [id number] Subjective: Patient states " I am fine.'  Objective: Edwin Martinez a 30 y.o.male, who is single , unemployed , lives in [place] by self , has a hx of Bipolar disorder, HIV , who has had multiple suicide attempts , recently 3 weeks ago , who presented with worsening SI with plan to [facility] under IVC .  Patient seen and chart reviewed.Discussed patient with treatment team.  Pt today seen as depressed, withdrawn to bed , poor eye contact. Pt tolerating his medications well , denies ADRs. Pt reports improvement in his mood since admission. Per staff , no disruptive issues noted on the unit. Continue to encourage and support.       Principal Problem: Bipolar I disorder, most recent episode depressed, severe without psychotic features ([place]) Diagnosis:   Patient Active Problem List   Diagnosis Date Noted  . Bipolar I disorder, most recent episode depressed, severe without psychotic features ([place]) [F31.4] 08/18/2016  . Cocaine use disorder, mild, abuse [F14.10] 07/27/2016  . Cannabis use disorder, moderate, dependence ([place]) [F12.20] 07/27/2016  . Tobacco use disorder [F17.200] 07/27/2016  . Intentional drug overdose ([place]) [T50.902A] 07/18/2016  . Asthma [J45.909] 05/20/2007  . Human immunodeficiency virus (HIV) disease ([place]) [B20] 05/05/2007   Total Time spent with patient: 25 minutes  Past Psychiatric History: Please see H&P.   Past Medical History:  Past Medical History:  Diagnosis Date  . ADHD (attention deficit hyperactivity disorder)   . ADHD (attention deficit hyperactivity disorder) 09/12/2012  . Anxiety   . Asthma   . Bipolar 1 disorder ([place])   . Bipolar disorder ([place])   . Epileptic seizures ([place])   . HIV (human immunodeficiency virus infection) ([employer])   . Hypertension   . Schizophrenia ([place])   . Seizures ([place])     Past Surgical History:  Procedure Laterality Date  . DENTAL SURGERY      Family History:  Family History  Problem Relation Age of Onset  . Huntington's disease Father   . Heart disease Mother   . Suicidality Maternal Uncle   . Suicidality Maternal Grandmother    Family Psychiatric  History: Please see H&P.  Social History: Please see H&P.  History  Alcohol Use  . 1.2 oz/week  . 2 Standard drinks or equivalent per week    Comment: once week      History  Drug Use  . Frequency: 2.0 times per week  . Types: Marijuana, Cocaine    Comment: every other day     Social History   Social History  . Marital status: Single    Spouse name: N/A  . Number of children: N/A  . Years of education: N/A   Social History Main Topics  . Smoking status: Current Every Day Smoker    Packs/day: 0.50    Types: Cigarettes    Start date: 09/29/1991  . Smokeless tobacco: Never Used  . Alcohol use 1.2 oz/week    2 Standard drinks or equivalent per week     Comment: once week   . Drug use:     Frequency: 2.0 times per week    Types: Marijuana, Cocaine     Comment: every other day   . Sexual activity: Yes    Birth control/ protection: Condom   Other Topics Concern  . None   Social History Narrative   ** Merged History Encounter **       Additional  Social History:                         Sleep: Fair  Appetite:  Fair  Current Medications: Current Facility-Administered Medications  Medication Dose Route Frequency Provider Last Rate Last Dose  . abacavir-dolutegravir-lamiVUDine (TRIUMEQ) 062-37-628 MG per tablet 1 tablet  1 tablet Oral QHS Lurena Nida, NP   1 tablet at 08/18/16 2127  . acetaminophen (TYLENOL) tablet 650 mg  650 mg Oral Q6H PRN Lurena Nida, NP      . albuterol (PROVENTIL HFA;VENTOLIN HFA) 108 (90 Base) MCG/ACT inhaler 2 puff  2 puff Inhalation Q6H PRN Lurena Nida, NP      . benztropine (COGENTIN) tablet 1 mg  1 mg Oral QHS Ursula Alert, MD   1 mg at 08/18/16 2128  . chlordiazePOXIDE (LIBRIUM) capsule 25 mg  25 mg Oral  TID PRN Ursula Alert, MD      . citalopram (CELEXA) tablet 20 mg  20 mg Oral Daily Lurena Nida, NP   20 mg at 08/19/16 0739  . hydrOXYzine (ATARAX/VISTARIL) tablet 25 mg  25 mg Oral Q6H PRN Ursula Alert, MD      . OLANZapine (ZYPREXA) tablet 10 mg  10 mg Oral TID PRN Ursula Alert, MD       Or  . OLANZapine (ZYPREXA) injection 10 mg  10 mg Intramuscular TID PRN Ursula Alert, MD      . risperiDONE (RISPERDAL) tablet 1 mg  1 mg Oral Daily Lurena Nida, NP   1 mg at 08/19/16 0739  . risperiDONE (RISPERDAL) tablet 3 mg  3 mg Oral QHS Lurena Nida, NP   3 mg at 08/18/16 2128  . traZODone (DESYREL) tablet 50 mg  50 mg Oral QHS PRN Ursula Alert, MD   50 mg at 08/18/16 2129    Lab Results:  Results for orders placed or performed during the hospital encounter of 08/17/16 (from the past 48 hour(s))  Comprehensive metabolic panel     Status: None   Collection Time: 08/19/16  6:40 AM  Result Value Ref Range   Sodium 138 135 - 145 mmol/L   Potassium 3.8 3.5 - 5.1 mmol/L   Chloride 108 101 - 111 mmol/L   CO2 23 22 - 32 mmol/L   Glucose, Bld 86 65 - 99 mg/dL   BUN 12 6 - 20 mg/dL   Creatinine, Ser 0.87 0.61 - 1.24 mg/dL   Calcium 8.9 8.9 - 10.3 mg/dL   Total Protein 6.8 6.5 - 8.1 g/dL   Albumin 3.8 3.5 - 5.0 g/dL   AST 25 15 - 41 U/L   ALT 23 17 - 63 U/L   Alkaline Phosphatase 62 38 - 126 U/L   Total Bilirubin 0.5 0.3 - 1.2 mg/dL   GFR calc non Af Amer >60 >60 mL/min   GFR calc Af Amer >60 >60 mL/min    Comment: (NOTE) The eGFR has been calculated using the CKD EPI equation. This calculation has not been validated in all clinical situations. eGFR's persistently <60 mL/min signify possible Chronic Kidney Disease.    Anion gap 7 5 - 15    Comment: Performed at [facility]    Blood Alcohol level:  Lab Results  Component Value Date   ETH 187 (H) 08/17/2016   ETH <5 31/51/7616    Metabolic Disorder Labs: Lab Results  Component Value Date   HGBA1C 5.5  05/04/2016   MPG  111 05/04/2016   No results found for: PROLACTIN Lab Results  Component Value Date   CHOL 136 02/05/2015   TRIG 147 02/05/2015   HDL 54 02/05/2015   CHOLHDL 2.5 02/05/2015   VLDL 29 02/05/2015   LDLCALC 53 02/05/2015   LDLCALC 45 02/21/2014    Physical Findings: AIMS: Facial and Oral Movements Muscles of Facial Expression: None, normal Lips and Perioral Area: None, normal Jaw: None, normal Tongue: None, normal,Extremity Movements Upper (arms, wrists, hands, fingers): None, normal Lower (legs, knees, ankles, toes): None, normal, Trunk Movements Neck, shoulders, hips: None, normal, Overall Severity Severity of abnormal movements (highest score from questions above): None, normal Incapacitation due to abnormal movements: None, normal Patient's awareness of abnormal movements (rate only patient's report): No Awareness, Dental Status Current problems with teeth and/or dentures?: No Does patient usually wear dentures?: No  CIWA:    COWS:     Musculoskeletal: Strength & Muscle Tone: within normal limits Gait & Station: normal Patient leans: N/A  Psychiatric Specialty Exam: Physical Exam  Nursing note and vitals reviewed.   Review of Systems  Psychiatric/Behavioral: Positive for depression. The patient is nervous/anxious.   All other systems reviewed and are negative.   Blood pressure 115/79, pulse 69, temperature 97.7 F (36.5 C), temperature source Oral, resp. rate 16, height _0  (1.702 m), weight 67.6 kg (149 lb), SpO2 100 %.Body mass index is 23.34 kg/m.  General Appearance: Guarded  Eye Contact:  Minimal  Speech:  Slow  Volume:  Decreased  Mood:  Anxious and Depressed  Affect:  Depressed  Thought Process:  Goal Directed and Descriptions of Associations: Intact  Orientation:  Full (Time, Place, and Person)  Thought Content:  Paranoid Ideation and Rumination improving  Suicidal Thoughts:  No s/p recent OD as well as SI with plan on admission   Homicidal Thoughts:  No  Memory:  Immediate;   Fair Recent;   Fair Remote;   Fair  Judgement:  Impaired  Insight:  Shallow  Psychomotor Activity:  Decreased  Concentration:  Concentration: Fair and Attention Span: Fair  Recall:  [employer] of Knowledge:  Fair  Language:  Fair  Akathisia:  No  Handed:  Right  AIMS (if indicated):     Assets:  Desire for Improvement  ADL's:  Intact  Cognition:  WNL  Sleep:  Number of Hours: 6.75     Treatment Plan Summary:Patient today seen as withdrawn, depressed, denies any new concerns , making progress. Continue treatment.  Will continue today 08/19/16 plan as below except where it is noted.  Bipolar I disorder, most recent episode depressed, severe without psychotic features ([place]) unstable - improving   Daily contact with patient to assess and evaluate symptoms and progress in treatment and Medication management For mood sx: Will continue Risperidone 1 mg po daily and 3 mg po qhs. Will continue Lamictal 25 mg po daily. Will contiue Celexa 20 mg po daily - home dose.  For insomnia: Will continue Trazodone 50 mg po qhs prn.  For anxiety/agitation: Will continue PRN medications as per MAR.  For HIV: Continue Triumeq PO daily at bedtime as scheduled. Labs reviewed- 07/18/16 CD4 T Cell Abs 400 - 2,700 /uL 2,020   CD4 % Helper T Cell 33 - 55 %     Will continue to monitor vitals ,medication compliance and treatment side effects while patient is here.   Will monitor for medical issues as well as call consult as needed.   Reviewed labs- tsh -  05/04/16- wnl, hba1c- 05/04/16- wnl ,cbc - wnl, UDS- pos for cocaine, THC, BAL - 187 ,CMp - shows k+ low on admission - repeat today - wnl , pending  lipid panel, pl .  I have reviewed EKG for qtc - wnl - 07/19/16.  CSW will continue  working on disposition.   Patient to participate in therapeutic milieu .   Quasean Frye, MD 08/19/2016, 10:40 AM

## 2016-08-19 NOTE — Tx Team (Signed)
Interdisciplinary Treatment and Diagnostic Plan Update  08/19/2016 Time of Session: 2:00 PM  Edwin Martinez MRN: [id number]  Principal Diagnosis: Bipolar I disorder, most recent episode depressed, severe without psychotic features ([place])  Secondary Diagnoses: Principal Problem:   Bipolar I disorder, most recent episode depressed, severe without psychotic features ([place]) Active Problems:   Cocaine use disorder, mild, abuse   Tobacco use disorder   Current Medications:  Current Facility-Administered Medications  Medication Dose Route Frequency Provider Last Rate Last Dose  . abacavir-dolutegravir-lamiVUDine (TRIUMEQ) 875-64-332 MG per tablet 1 tablet  1 tablet Oral QHS Lurena Nida, NP   1 tablet at 08/18/16 2127  . acetaminophen (TYLENOL) tablet 650 mg  650 mg Oral Q6H PRN Lurena Nida, NP      . albuterol (PROVENTIL HFA;VENTOLIN HFA) 108 (90 Base) MCG/ACT inhaler 2 puff  2 puff Inhalation Q6H PRN Lurena Nida, NP      . benztropine (COGENTIN) tablet 1 mg  1 mg Oral QHS Ursula Alert, MD   1 mg at 08/18/16 2128  . chlordiazePOXIDE (LIBRIUM) capsule 25 mg  25 mg Oral TID PRN Ursula Alert, MD      . citalopram (CELEXA) tablet 20 mg  20 mg Oral Daily Lurena Nida, NP   20 mg at 08/19/16 0739  . hydrOXYzine (ATARAX/VISTARIL) tablet 25 mg  25 mg Oral Q6H PRN Ursula Alert, MD      . OLANZapine (ZYPREXA) tablet 10 mg  10 mg Oral TID PRN Ursula Alert, MD       Or  . OLANZapine (ZYPREXA) injection 10 mg  10 mg Intramuscular TID PRN Ursula Alert, MD      . risperiDONE (RISPERDAL) tablet 1 mg  1 mg Oral Daily Lurena Nida, NP   1 mg at 08/19/16 0739  . risperiDONE (RISPERDAL) tablet 3 mg  3 mg Oral QHS Lurena Nida, NP   3 mg at 08/18/16 2128  . traZODone (DESYREL) tablet 50 mg  50 mg Oral QHS PRN Ursula Alert, MD   50 mg at 08/18/16 2129    PTA Medications: Prescriptions Prior to Admission  Medication Sig Dispense Refill Last Dose  . abacavir-dolutegravir-lamiVUDine  (TRIUMEQ) 600-50-300 MG tablet Take 1 tablet by mouth at bedtime.   Past Week at Unknown time  . albuterol (PROVENTIL HFA;VENTOLIN HFA) 108 (90 Base) MCG/ACT inhaler Inhale 2 puffs into the lungs every 6 (six) hours as needed for wheezing or shortness of breath. 1 Inhaler 0 unknown  . citalopram (CELEXA) 20 MG tablet Take 1 tablet (20 mg total) by mouth daily. 30 tablet 0 08/16/2016 at Unknown time  . risperiDONE (RISPERDAL) 1 MG tablet Take 1 tablet (1 mg total) by mouth daily. 30 tablet 0 08/16/2016 at Unknown time  . risperiDONE (RISPERDAL) 3 MG tablet Take 1 tablet (3 mg total) by mouth at bedtime. 30 tablet 0 08/16/2016 at Unknown time    Treatment Modalities: Medication Management, Group therapy, Case management,  1 to 1 session with clinician, Psychoeducation, Recreational therapy.   Physician Treatment Plan for Primary Diagnosis: Bipolar I disorder, most recent episode depressed, severe without psychotic features ([place]) Long Term Goal(s): Improvement in symptoms so as ready for discharge  Short Term Goals: Ability to disclose and discuss suicidal ideas Compliance with prescribed medications will improve Ability to identify triggers associated with substance abuse/mental health issues will improve Ability to disclose and discuss suicidal ideas Compliance with prescribed medications will improve Ability to identify triggers associated with substance abuse/mental health  issues will improve  Medication Management: Evaluate patient's response, side effects, and tolerance of medication regimen.  Therapeutic Interventions: 1 to 1 sessions, Unit Group sessions and Medication administration.  Evaluation of Outcomes: Progressing  Physician Treatment Plan for Secondary Diagnosis: Principal Problem:   Bipolar I disorder, most recent episode depressed, severe without psychotic features ([place]) Active Problems:   Cocaine use disorder, mild, abuse   Tobacco use disorder   Long Term Goal(s):  Improvement in symptoms so as ready for discharge  Short Term Goals: Ability to disclose and discuss suicidal ideas Compliance with prescribed medications will improve Ability to identify triggers associated with substance abuse/mental health issues will improve Ability to disclose and discuss suicidal ideas Compliance with prescribed medications will improve Ability to identify triggers associated with substance abuse/mental health issues will improve  Medication Management: Evaluate patient's response, side effects, and tolerance of medication regimen.  Therapeutic Interventions: 1 to 1 sessions, Unit Group sessions and Medication administration.  Evaluation of Outcomes: Progressing   RN Treatment Plan for Primary Diagnosis: Bipolar I disorder, most recent episode depressed, severe without psychotic features ([place]) Long Term Goal(s): Knowledge of disease and therapeutic regimen to maintain health will improve  Short Term Goals: Ability to identify and develop effective coping behaviors will improve and Compliance with prescribed medications will improve  Medication Management: RN will administer medications as ordered by provider, will assess and evaluate patient's response and provide education to patient for prescribed medication. RN will report any adverse and/or side effects to prescribing provider.  Therapeutic Interventions: 1 on 1 counseling sessions, Psychoeducation, Medication administration, Evaluate responses to treatment, Monitor vital signs and CBGs as ordered, Perform/monitor CIWA, COWS, AIMS and Fall Risk screenings as ordered, Perform wound care treatments as ordered.  Evaluation of Outcomes: Progressing   LCSW Treatment Plan for Primary Diagnosis: Bipolar I disorder, most recent episode depressed, severe without psychotic features ([place]) Long Term Goal(s): Safe transition to appropriate next level of care at discharge, Engage patient in therapeutic group addressing  interpersonal concerns.  Short Term Goals: Engage patient in aftercare planning with referrals and resources  Therapeutic Interventions: Assess for all discharge needs, 1 to 1 time with Social worker, Explore available resources and support systems, Assess for adequacy in community support network, Educate family and significant other(s) on suicide prevention, Complete Psychosocial Assessment, Interpersonal group therapy.  Evaluation of Outcomes: Met   Progress in Treatment: Attending groups: Yes Participating in groups: Yes Taking medication as prescribed: Yes Toleration medication: Yes, no side effects reported at this time Family/Significant other contact made:  Patient understands diagnosis: Yes AEB Discussing patient identified problems/goals with staff: Yes Medical problems stabilized or resolved: Yes Denies suicidal/homicidal ideation: Yes Issues/concerns per patient self-inventory: None Other: N/A  New problem(s) identified: None identified at this time.   New Short Term/Long Term Goal(s): None identified at this time.   Discharge Plan or Barriers:   Reason for Continuation of Hospitalization: Anxiety Delusions  Depression Hallucinations Homicidal ideation [place] Medical Issues Medication stabilization Suicidal ideation Withdrawal symptoms  Estimated Length of Stay: 3-5 days  Attendees: Patient: 08/19/2016  2:00 PM  Physician: Ursula Alert, MD 08/19/2016  2:00 PM  Nursing: Hoy Register, RN 08/19/2016  2:00 PM  RN Care Manager: Lars Pinks, RN 08/19/2016  2:00 PM  Social Worker: Ripley Fraise 08/19/2016  2:00 PM  Recreational Therapist: Marjette  08/19/2016  2:00 PM  Other: Norberto Sorenson 08/19/2016  2:00 PM  Other:  08/19/2016  2:00 PM    Scribe for Treatment Team:  Interdisciplinary Treatment and Diagnostic Plan Update  08/19/2016 Time of Session: 2:00 PM  Edwin Martinez MRN: [id number]  Principal Diagnosis: Bipolar I disorder, most recent  episode depressed, severe without psychotic features ([place])  Secondary Diagnoses: Principal Problem:   Bipolar I disorder, most recent episode depressed, severe without psychotic features ([place]) Active Problems:   Cocaine use disorder, mild, abuse   Tobacco use disorder   Current Medications:  Current Facility-Administered Medications  Medication Dose Route Frequency Provider Last Rate Last Dose  . abacavir-dolutegravir-lamiVUDine (TRIUMEQ) 546-27-035 MG per tablet 1 tablet  1 tablet Oral QHS Lurena Nida, NP   1 tablet at 08/18/16 2127  . acetaminophen (TYLENOL) tablet 650 mg  650 mg Oral Q6H PRN Lurena Nida, NP      . albuterol (PROVENTIL HFA;VENTOLIN HFA) 108 (90 Base) MCG/ACT inhaler 2 puff  2 puff Inhalation Q6H PRN Lurena Nida, NP      . benztropine (COGENTIN) tablet 1 mg  1 mg Oral QHS Ursula Alert, MD   1 mg at 08/18/16 2128  . chlordiazePOXIDE (LIBRIUM) capsule 25 mg  25 mg Oral TID PRN Ursula Alert, MD      . citalopram (CELEXA) tablet 20 mg  20 mg Oral Daily Lurena Nida, NP   20 mg at 08/19/16 0739  . hydrOXYzine (ATARAX/VISTARIL) tablet 25 mg  25 mg Oral Q6H PRN Ursula Alert, MD      . OLANZapine (ZYPREXA) tablet 10 mg  10 mg Oral TID PRN Ursula Alert, MD       Or  . OLANZapine (ZYPREXA) injection 10 mg  10 mg Intramuscular TID PRN Ursula Alert, MD      . risperiDONE (RISPERDAL) tablet 1 mg  1 mg Oral Daily Lurena Nida, NP   1 mg at 08/19/16 0739  . risperiDONE (RISPERDAL) tablet 3 mg  3 mg Oral QHS Lurena Nida, NP   3 mg at 08/18/16 2128  . traZODone (DESYREL) tablet 50 mg  50 mg Oral QHS PRN Ursula Alert, MD   50 mg at 08/18/16 2129    PTA Medications: Prescriptions Prior to Admission  Medication Sig Dispense Refill Last Dose  . abacavir-dolutegravir-lamiVUDine (TRIUMEQ) 600-50-300 MG tablet Take 1 tablet by mouth at bedtime.   Past Week at Unknown time  . albuterol (PROVENTIL HFA;VENTOLIN HFA) 108 (90 Base) MCG/ACT inhaler Inhale 2 puffs into the  lungs every 6 (six) hours as needed for wheezing or shortness of breath. 1 Inhaler 0 unknown  . citalopram (CELEXA) 20 MG tablet Take 1 tablet (20 mg total) by mouth daily. 30 tablet 0 08/16/2016 at Unknown time  . risperiDONE (RISPERDAL) 1 MG tablet Take 1 tablet (1 mg total) by mouth daily. 30 tablet 0 08/16/2016 at Unknown time  . risperiDONE (RISPERDAL) 3 MG tablet Take 1 tablet (3 mg total) by mouth at bedtime. 30 tablet 0 08/16/2016 at Unknown time    Treatment Modalities: Medication Management, Group therapy, Case management,  1 to 1 session with clinician, Psychoeducation, Recreational therapy.   Physician Treatment Plan for Primary Diagnosis: Bipolar I disorder, most recent episode depressed, severe without psychotic features ([place]) Long Term Goal(s): Improvement in symptoms so as ready for discharge  Short Term Goals: Ability to disclose and discuss suicidal ideas   Medication Management: Evaluate patient's response, side effects, and tolerance of medication regimen.  Therapeutic Interventions: 1 to 1 sessions, Unit Group sessions and Medication administration.  Evaluation of Outcomes: Progressing  Physician Treatment Plan for Secondary Diagnosis:  Principal Problem:   Bipolar I disorder, most recent episode depressed, severe without psychotic features ([place]) Active Problems:   Cocaine use disorder, mild, abuse   Tobacco use disorder   Long Term Goal(s): Improvement in symptoms so as ready for discharge  Short Term Goals:  Compliance with prescribed medications will improve Ability to identify triggers associated with substance abuse/mental health issues will improve  Medication Management: Evaluate patient's response, side effects, and tolerance of medication regimen.  Therapeutic Interventions: 1 to 1 sessions, Unit Group sessions and Medication administration.  Evaluation of Outcomes: Progressing   RN Treatment Plan for Primary Diagnosis: Bipolar I disorder, most  recent episode depressed, severe without psychotic features ([place]) Long Term Goal(s): Knowledge of disease and therapeutic regimen to maintain health will improve  Short Term Goals: Ability to identify and develop effective coping behaviors will improve and Compliance with prescribed medications will improve  Medication Management: RN will administer medications as ordered by provider, will assess and evaluate patient's response and provide education to patient for prescribed medication. RN will report any adverse and/or side effects to prescribing provider.  Therapeutic Interventions: 1 on 1 counseling sessions, Psychoeducation, Medication administration, Evaluate responses to treatment, Monitor vital signs and CBGs as ordered, Perform/monitor CIWA, COWS, AIMS and Fall Risk screenings as ordered, Perform wound care treatments as ordered.  Evaluation of Outcomes: Progressing   LCSW Treatment Plan for Primary Diagnosis: Bipolar I disorder, most recent episode depressed, severe without psychotic features ([place]) Long Term Goal(s): Safe transition to appropriate next level of care at discharge, Engage patient in therapeutic group addressing interpersonal concerns.  Short Term Goals: Engage patient in aftercare planning with referrals and resources  Therapeutic Interventions: Assess for all discharge needs, 1 to 1 time with Social worker, Explore available resources and support systems, Assess for adequacy in community support network, Educate family and significant other(s) on suicide prevention, Complete Psychosocial Assessment, Interpersonal group therapy.  Evaluation of Outcomes: Met   Return home, follow up outpt  Progress in Treatment: Attending groups: Yes Participating in groups: Yes Taking medication as prescribed: Yes Toleration medication: Yes, no side effects reported at this time Family/Significant other contact made:  Patient understands diagnosis: Yes AEB Discussing patient  identified problems/goals with staff: Yes Medical problems stabilized or resolved: Yes Denies suicidal/homicidal ideation: Yes Issues/concerns per patient self-inventory: None Other: N/A  New problem(s) identified: None identified at this time.   New Short Term/Long Term Goal(s): None identified at this time.   Discharge Plan or Barriers:   Reason for Continuation of Hospitalization: Anxiety Delusions  Depression Hallucinations Homicidal ideation [place] Medical Issues Medication stabilization Suicidal ideation Withdrawal symptoms  Estimated Length of Stay: 3-5 days  Attendees: Patient: 08/19/2016  2:00 PM  Physician: Ursula Alert, MD 08/19/2016  2:00 PM  Nursing: Hoy Register, RN 08/19/2016  2:00 PM  RN Care Manager: Lars Pinks, RN 08/19/2016  2:00 PM  Social Worker: Ripley Fraise 08/19/2016  2:00 PM  Recreational Therapist: Marjette  08/19/2016  2:00 PM  Other: Norberto Sorenson 08/19/2016  2:00 PM  Other:  08/19/2016  2:00 PM    Scribe for Treatment Team:  Roque Lias 08/19/2016 2:00 PM

## 2016-08-19 NOTE — Progress Notes (Signed)
Recreation Therapy Notes  Date: 08/19/16 Time: 1000 Location: 500 Hall Dayroom  Group Topic: Self-Expression  Goal Area(s) Addresses:  Patient will identify things they are thankful for. Patient will verbalize benefit of being thankful.  Intervention: Worksheet with a picture frame, markers  Activity: What I'm Thankful For.  Patients were given a worksheet with the outline of a picture frame.  Patients were to draw, write a poem or list the things they were thankful for.  Education:  Self-Expression, Building control surveyorDischarge Planning.   Education Outcome: Acknowledges education/In group clarification offered/Needs additional education  Clinical Observations/Feedback: Pt did not attend group.   Caroll RancherMarjette Saharah Sherrow, LRT/CTRS         Caroll RancherLindsay, Ulas Zuercher A 08/19/2016 11:26 AM

## 2016-08-19 NOTE — [facility] Group Notes (Signed)
[facility]  08/19/2016 , 1:24 PM    Type of Therapy:  M[facility] [employer] Presentation  Participation Level:  Active  Participation Quality:  Attentive  Affect:  Blunted  Cognitive:  Oriented  Insight:  Limited  Engagement in Therapy:  Engaged  Modes of Intervention:  Discussion, Education and Socialization  Summary of Progress/Problems:  Onalee HuaDavid from [employer] came to present his recovery story and play the guitar.  Came with some cajoling. Stayed the entire time, engaged throughout.  Daryel Geraldorth, Vonna Brabson B 08/19/2016 , 1:24 PM

## 2016-08-19 NOTE — Plan of Care (Signed)
Problem: Medication: Goal: Compliance with prescribed medication regimen will improve Outcome: Progressing Client is compliant with medications AEB taking medications during med administration schedule, without incidence.

## 2016-08-20 DIAGNOSIS — Z8249 Family history of ischemic heart disease and other diseases of the circulatory system: Secondary | ICD-10-CM

## 2016-08-20 DIAGNOSIS — Z8489 Family history of other specified conditions: Secondary | ICD-10-CM

## 2016-08-20 DIAGNOSIS — F314 Bipolar disorder, current episode depressed, severe, without psychotic features: Principal | ICD-10-CM

## 2016-08-20 DIAGNOSIS — F17210 Nicotine dependence, cigarettes, uncomplicated: Secondary | ICD-10-CM

## 2016-08-20 DIAGNOSIS — F17200 Nicotine dependence, unspecified, uncomplicated: Secondary | ICD-10-CM

## 2016-08-20 DIAGNOSIS — F319 Bipolar disorder, unspecified: Secondary | ICD-10-CM

## 2016-08-20 DIAGNOSIS — F1410 Cocaine abuse, uncomplicated: Secondary | ICD-10-CM

## 2016-08-20 DIAGNOSIS — Z79899 Other long term (current) drug therapy: Secondary | ICD-10-CM

## 2016-08-20 LAB — PROLACTIN: Prolactin: 32.3 ng/mL — ABNORMAL HIGH (ref 4.0–15.2)

## 2016-08-20 NOTE — Progress Notes (Signed)
D: Pt at the time of assessment denies any form of depression, anxiety, pain, SI, HI or AVH; states, "I feel better now and I think I am ready to go home." Pt is however flat and withdrawn to self even while at the day room. Pt remained calm and cooperative. A: Medications offered as prescribed.  Support, encouragement, and safe environment provided.  15-minute safety checks continue. R: Pt was med compliant.  Pt attended wrap-up group. Safety checks continue.

## 2016-08-20 NOTE — Progress Notes (Signed)
[facility] MD Progress Note  08/20/2016 1:08 PM Edwin Martinez  MRN:  [id number]  Subjective: Patient states " I am improving"'  Objective: Edwin Martinez a 30 y.o.male, who is single , unemployed , lives in [place] by self , has a hx of Bipolar disorder, HIV , who has had multiple suicide attempts , recently 3 weeks ago , who presented with worsening SI with plan to [facility] under IVC .  Patient seen and chart reviewed.Discussed patient with treatment team.  Pt today is transferred to the 400-Hall Pt tolerating his medications well , denies ADRs. Pt reports improvement in his mood since admission. Per staff , no disruptive issues noted on the unit. Continue to encourage and support.  Principal Problem: Bipolar I disorder, most recent episode depressed, severe without psychotic features ([place]) Diagnosis:   Patient Active Problem List   Diagnosis Date Noted  . Bipolar I disorder, most recent episode depressed, severe without psychotic features ([place]) [F31.4] 08/18/2016  . Cocaine use disorder, mild, abuse [F14.10] 07/27/2016  . Cannabis use disorder, moderate, dependence ([place]) [F12.20] 07/27/2016  . Tobacco use disorder [F17.200] 07/27/2016  . Intentional drug overdose ([place]) [T50.902A] 07/18/2016  . Asthma [J45.909] 05/20/2007  . Human immunodeficiency virus (HIV) disease ([place]) [B20] 05/05/2007   Total Time spent with patient: 15 minutes  Past Psychiatric History: Please see H&P.  Past Medical History:  Past Medical History:  Diagnosis Date  . ADHD (attention deficit hyperactivity disorder)   . ADHD (attention deficit hyperactivity disorder) 09/12/2012  . Anxiety   . Asthma   . Bipolar 1 disorder ([place])   . Bipolar disorder ([place])   . Epileptic seizures ([place])   . HIV (human immunodeficiency virus infection) ([place])   . Hypertension   . Schizophrenia ([place])   . Seizures ([place])     Past Surgical History:  Procedure Laterality Date  . DENTAL SURGERY     Family History:  Family  History  Problem Relation Age of Onset  . Huntington's disease Father   . Heart disease Mother   . Suicidality Maternal Uncle   . Suicidality Maternal Grandmother    Family Psychiatric  History: Please see H&P.  Social History: Please see H&P.  History  Alcohol Use  . 1.2 oz/week  . 2 Standard drinks or equivalent per week    Comment: once week      History  Drug Use  . Frequency: 2.0 times per week  . Types: Marijuana, Cocaine    Comment: every other day     Social History   Social History  . Marital status: Single    Spouse name: N/A  . Number of children: N/A  . Years of education: N/A   Social History Main Topics  . Smoking status: Current Every Day Smoker    Packs/day: 0.50    Types: Cigarettes    Start date: 09/29/1991  . Smokeless tobacco: Never Used  . Alcohol use 1.2 oz/week    2 Standard drinks or equivalent per week     Comment: once week   . Drug use:     Frequency: 2.0 times per week    Types: Marijuana, Cocaine     Comment: every other day   . Sexual activity: Yes    Birth control/ protection: Condom   Other Topics Concern  . None   Social History Narrative   ** Merged History Encounter **       Additional Social History:   Sleep: Fair  Appetite:  Fair  Current Medications: Current Facility-Administered Medications  Medication Dose Route Frequency Provider Last Rate Last Dose  . abacavir-dolutegravir-lamiVUDine (TRIUMEQ) 600-50-300 MG per tablet 1 tablet  1 tablet Oral QHS Lurena Nida, NP   1 tablet at 08/19/16 2133  . acetaminophen (TYLENOL) tablet 650 mg  650 mg Oral Q6H PRN Lurena Nida, NP      . albuterol (PROVENTIL HFA;VENTOLIN HFA) 108 (90 Base) MCG/ACT inhaler 2 puff  2 puff Inhalation Q6H PRN Lurena Nida, NP      . benztropine (COGENTIN) tablet 1 mg  1 mg Oral QHS Ursula Alert, MD   1 mg at 08/19/16 2133  . chlordiazePOXIDE (LIBRIUM) capsule 25 mg  25 mg Oral TID PRN Ursula Alert, MD      . citalopram (CELEXA) tablet 20  mg  20 mg Oral Daily Lurena Nida, NP   20 mg at 08/20/16 0755  . hydrOXYzine (ATARAX/VISTARIL) tablet 25 mg  25 mg Oral Q6H PRN Saramma Eappen, MD      . OLANZapine (ZYPREXA) tablet 10 mg  10 mg Oral TID PRN Ursula Alert, MD       Or  . OLANZapine (ZYPREXA) injection 10 mg  10 mg Intramuscular TID PRN Ursula Alert, MD      . risperiDONE (RISPERDAL) tablet 1 mg  1 mg Oral Daily Lurena Nida, NP   1 mg at 08/20/16 0755  . risperiDONE (RISPERDAL) tablet 3 mg  3 mg Oral QHS Lurena Nida, NP   3 mg at 08/19/16 2133  . traZODone (DESYREL) tablet 50 mg  50 mg Oral QHS PRN Ursula Alert, MD   50 mg at 08/19/16 2133   Lab Results:  Results for orders placed or performed during the hospital encounter of 08/17/16 (from the past 48 hour(s))  Comprehensive metabolic panel     Status: None   Collection Time: 08/19/16  6:40 AM  Result Value Ref Range   Sodium 138 135 - 145 mmol/L   Potassium 3.8 3.5 - 5.1 mmol/L   Chloride 108 101 - 111 mmol/L   CO2 23 22 - 32 mmol/L   Glucose, Bld 86 65 - 99 mg/dL   BUN 12 6 - 20 mg/dL   Creatinine, Ser 0.87 0.61 - 1.24 mg/dL   Calcium 8.9 8.9 - 10.3 mg/dL   Total Protein 6.8 6.5 - 8.1 g/dL   Albumin 3.8 3.5 - 5.0 g/dL   AST 25 15 - 41 U/L   ALT 23 17 - 63 U/L   Alkaline Phosphatase 62 38 - 126 U/L   Total Bilirubin 0.5 0.3 - 1.2 mg/dL   GFR calc non Af Amer >60 >60 mL/min   GFR calc Af Amer >60 >60 mL/min    Comment: (NOTE) The eGFR has been calculated using the CKD EPI equation. This calculation has not been validated in all clinical situations. eGFR's persistently <60 mL/min signify possible Chronic Kidney Disease.    Anion gap 7 5 - 15    Comment: Performed at [facility]  Lipid panel     Status: None   Collection Time: 08/19/16  6:40 AM  Result Value Ref Range   Cholesterol 140 0 - 200 mg/dL   Triglycerides 73 <150 mg/dL   HDL 50 >40 mg/dL   Total CHOL/HDL Ratio 2.8 RATIO   VLDL 15 0 - 40 mg/dL   LDL Cholesterol 75 0 -  99 mg/dL    Comment:        Total  Cholesterol/HDL:CHD Risk Coronary Heart Disease Risk Table                     Men   Women  1/2 Average Risk   3.4   3.3  Average Risk       5.0   4.4  2 X Average Risk   9.6   7.1  3 X Average Risk  23.4   11.0        Use the calculated Patient Ratio above and the CHD Risk Table to determine the patient's CHD Risk.        ATP III CLASSIFICATION (LDL):  <100     mg/dL   Optimal  100-129  mg/dL   Near or Above                    Optimal  130-159  mg/dL   Borderline  160-189  mg/dL   High  >190     mg/dL   Very High Performed at [facility]   Prolactin     Status: Abnormal   Collection Time: 08/19/16  6:40 AM  Result Value Ref Range   Prolactin 32.3 (H) 4.0 - 15.2 ng/mL    Comment: (NOTE) Performed At: [facility] [place], [place] [id number] Lindon Romp MD UX:[id number] Performed at [facility]    Blood Alcohol level:  Lab Results  Component Value Date   ETH 187 (H) 08/17/2016   ETH <5 25/42/7062   Metabolic Disorder Labs: Lab Results  Component Value Date   HGBA1C 5.5 05/04/2016   MPG 111 05/04/2016   Lab Results  Component Value Date   PROLACTIN 32.3 (H) 08/19/2016   Lab Results  Component Value Date   CHOL 140 08/19/2016   TRIG 73 08/19/2016   HDL 50 08/19/2016   CHOLHDL 2.8 08/19/2016   VLDL 15 08/19/2016   LDLCALC 75 08/19/2016   LDLCALC 53 02/05/2015    Physical Findings: AIMS: Facial and Oral Movements Muscles of Facial Expression: None, normal Lips and Perioral Area: None, normal Jaw: None, normal Tongue: None, normal,Extremity Movements Upper (arms, wrists, hands, fingers): None, normal Lower (legs, knees, ankles, toes): None, normal, Trunk Movements Neck, shoulders, hips: None, normal, Overall Severity Severity of abnormal movements (highest score from questions above): None, normal Incapacitation due to abnormal movements: None, normal Patient's  awareness of abnormal movements (rate only patient's report): No Awareness, Dental Status Current problems with teeth and/or dentures?: No Does patient usually wear dentures?: No  CIWA:  CIWA-Ar Total: 1 COWS:     Musculoskeletal: Strength & Muscle Tone: within normal limits Gait & Station: normal Patient leans: N/A  Psychiatric Specialty Exam: Physical Exam  Nursing note and vitals reviewed.   Review of Systems  Psychiatric/Behavioral: Positive for depression. The patient is nervous/anxious.   All other systems reviewed and are negative.   Blood pressure 115/79, pulse 69, temperature 97.7 F (36.5 C), temperature source Oral, resp. rate 16, height _0  (1.702 m), weight 67.6 kg (149 lb), SpO2 100 %.Body mass index is 23.34 kg/m.  General Appearance: Guarded  Eye Contact:  Minimal  Speech:  Slow  Volume:  Decreased  Mood:  Anxious and Depressed  Affect:  Depressed  Thought Process:  Goal Directed and Descriptions of Associations: Intact  Orientation:  Full (Time, Place, and Person)  Thought Content:  Paranoid Ideation and Rumination improving  Suicidal Thoughts:  No s/p recent OD as well as SI  with plan on admission  Homicidal Thoughts:  No  Memory:  Immediate;   Fair Recent;   Fair Remote;   Fair  Judgement:  Impaired  Insight:  Shallow  Psychomotor Activity:  Decreased  Concentration:  Concentration: Fair and Attention Span: Fair  Recall:  [employer] of Knowledge:  Fair  Language:  Fair  Akathisia:  No  Handed:  Right  AIMS (if indicated):     Assets:  Desire for Improvement  ADL's:  Intact  Cognition:  WNL  Sleep:  Number of Hours: 5   Treatment Plan Summary: Patient denies any new concerns, making progress. Continue treatment.  Will continue today 08/20/16 plan as below except where it is noted.  Bipolar I disorder, most recent episode depressed, severe without psychotic features ([place]) unstable - improving   Daily contact with patient to assess and  evaluate symptoms and progress in treatment and Medication management For mood sx: Will continue Risperidone 1 mg po daily and 3 mg po qhs. Will continue Lamictal 25 mg po daily. Will contiue Celexa 20 mg po daily - home dose.  For insomnia: Will continue Trazodone 50 mg po qhs prn.  For anxiety/agitation: Will continue PRN medications as per MAR.  For HIV: Continue Triumeq PO daily at bedtime as scheduled. Labs reviewed- 07/18/16 CD4 T Cell Abs 400 - 2,700 /uL 2,020   CD4 % Helper T Cell 33 - 55 %     Will continue to monitor vitals ,medication compliance and treatment side effects while patient is here.   Will monitor for medical issues as well as call consult as needed.   Reviewed labs- tsh - 05/04/16- wnl, hba1c- 05/04/16- wnl ,cbc - wnl, UDS- pos for cocaine, THC, BAL - 187 ,CMp - shows k+ low on admission - repeat today - wnl , pending  lipid panel, pl .  Reviewed EKG for qtc - wnl - 07/19/16.  CSW will continue  working on disposition.   Patient to participate in therapeutic milieu .   Encarnacion Slates, NP, PMHNP, FNP-BC 08/20/2016, 1:08 PMPatient ID: Edwin Martinez, male   DOB: 06/06/1986, 30 y.o.   MRN: [id number]

## 2016-08-20 NOTE — Progress Notes (Signed)
DAR NOTE: Patient presents with anxious affect and depressed mood. Stayed in the bed the whole day. Denies pain, auditory and visual hallucinations.  Rates depression at  6, hopelessness at 2, and anxiety at 5.  Maintained on routine safety checks.  Medications given as prescribed.  Support and encouragement offered as needed.  Offered no complaint.

## 2016-08-20 NOTE — [facility] Group Notes (Signed)
[facility] Group Notes:  (Nursing/MHT/Case Management/Adjunct)  Date:  08/20/2016  Time:   2015  Type of Therapy:  Wrap up group  Participation Level:  Minimal  Participation Quality:  Inattentive  Affect:  Blunted  Cognitive:  Alert  Insight:  Improving  Engagement in Group:  Limited  Modes of Intervention:  Discussion  Summary of Progress/Problems:  We discussed working on our goals for today and shared what we were thankful for in our lives.  [name] reported that his goal for today was "stay on my medications and take my medications, so I did that."  He also reported that he was thankful for "everyone here, for my life and peace of mind."  He sat during the group reading a magazine but did share when asked.  Norm ParcelHeather V Quantasia Stegner 08/20/2016, 10:07 PM

## 2016-08-20 NOTE — Progress Notes (Signed)
Pt transferred to 402-1 to create a DNA for the room mate, pt's cooperative and happy to be in 400 hall, will continue to monitor.

## 2016-08-21 DIAGNOSIS — R45851 Suicidal ideations: Secondary | ICD-10-CM

## 2016-08-21 NOTE — Progress Notes (Signed)
D: Pt denies SI/HI/AVH. Pt is pleasant and cooperative. Pt goal for today is to stay on medication. A: Pt was offered support and encouragement. Pt was given scheduled medications. Pt was encourage to attend groups. Q 15 minute checks were done for safety.  R:Pt attends groups and interacts well with peers and staff. Pt is taking medication. Pt has no complaints.Pt receptive to treatment and safety maintained on unit.

## 2016-08-21 NOTE — Tx Team (Signed)
Interdisciplinary Treatment and Diagnostic Plan Update  08/21/2016 Time of Session: 11:10 AM  Edwin Martinez MRN: [id number]  Principal Diagnosis: Bipolar I disorder, most recent episode depressed, severe without psychotic features (HCC)  Secondary Diagnoses: Principal Problem:   Bipolar I disorder, most recent episode depressed, severe without psychotic features (HCC) Active Problems:   Cocaine use disorder, mild, abuse   Tobacco use disorder   Current Medications:  Current Facility-Administered Medications  Medication Dose Route Frequency Provider Last Rate Last Dose  . abacavir-dolutegravir-lamiVUDine (TRIUMEQ) 600-50-300 MG per tablet 1 tablet  1 tablet Oral QHS Kristeen Mans, NP   1 tablet at 08/20/16 2135  . acetaminophen (TYLENOL) tablet 650 mg  650 mg Oral Q6H PRN Kristeen Mans, NP      . albuterol (PROVENTIL HFA;VENTOLIN HFA) 108 (90 Base) MCG/ACT inhaler 2 puff  2 puff Inhalation Q6H PRN Kristeen Mans, NP      . benztropine (COGENTIN) tablet 1 mg  1 mg Oral QHS Jomarie Longs, MD   1 mg at 08/20/16 2126  . chlordiazePOXIDE (LIBRIUM) capsule 25 mg  25 mg Oral TID PRN Jomarie Longs, MD      . citalopram (CELEXA) tablet 20 mg  20 mg Oral Daily Kristeen Mans, NP   20 mg at 08/21/16 0825  . hydrOXYzine (ATARAX/VISTARIL) tablet 25 mg  25 mg Oral Q6H PRN Jomarie Longs, MD      . OLANZapine (ZYPREXA) tablet 10 mg  10 mg Oral TID PRN Jomarie Longs, MD       Or  . OLANZapine (ZYPREXA) injection 10 mg  10 mg Intramuscular TID PRN Jomarie Longs, MD      . risperiDONE (RISPERDAL) tablet 1 mg  1 mg Oral Daily Kristeen Mans, NP   1 mg at 08/21/16 0826  . risperiDONE (RISPERDAL) tablet 3 mg  3 mg Oral QHS Kristeen Mans, NP   3 mg at 08/20/16 2126  . traZODone (DESYREL) tablet 50 mg  50 mg Oral QHS PRN Jomarie Longs, MD   50 mg at 08/19/16 2133    PTA Medications: Prescriptions Prior to Admission  Medication Sig Dispense Refill Last Dose  . abacavir-dolutegravir-lamiVUDine  (TRIUMEQ) 600-50-300 MG tablet Take 1 tablet by mouth at bedtime.   Past Week at Unknown time  . albuterol (PROVENTIL HFA;VENTOLIN HFA) 108 (90 Base) MCG/ACT inhaler Inhale 2 puffs into the lungs every 6 (six) hours as needed for wheezing or shortness of breath. 1 Inhaler 0 unknown  . citalopram (CELEXA) 20 MG tablet Take 1 tablet (20 mg total) by mouth daily. 30 tablet 0 08/16/2016 at Unknown time  . risperiDONE (RISPERDAL) 1 MG tablet Take 1 tablet (1 mg total) by mouth daily. 30 tablet 0 08/16/2016 at Unknown time  . risperiDONE (RISPERDAL) 3 MG tablet Take 1 tablet (3 mg total) by mouth at bedtime. 30 tablet 0 08/16/2016 at Unknown time    Treatment Modalities: Medication Management, Group therapy, Case management,  1 to 1 session with clinician, Psychoeducation, Recreational therapy.   Physician Treatment Plan for Primary Diagnosis: Bipolar I disorder, most recent episode depressed, severe without psychotic features (HCC) Long Term Goal(s): Improvement in symptoms so as ready for discharge  Short Term Goals: Ability to disclose and discuss suicidal ideas Compliance with prescribed medications will improve Ability to identify triggers associated with substance abuse/mental health issues will improve Ability to disclose and discuss suicidal ideas Compliance with prescribed medications will improve Ability to identify triggers associated with substance abuse/mental health  issues will improve  Medication Management: Evaluate patient's response, side effects, and tolerance of medication regimen.  Therapeutic Interventions: 1 to 1 sessions, Unit Group sessions and Medication administration.  Evaluation of Outcomes: Adequate for Discharge  Physician Treatment Plan for Secondary Diagnosis: Principal Problem:   Bipolar I disorder, most recent episode depressed, severe without psychotic features ([place]) Active Problems:   Cocaine use disorder, mild, abuse   Tobacco use disorder   Long Term  Goal(s): Improvement in symptoms so as ready for discharge  Short Term Goals: Ability to disclose and discuss suicidal ideas Compliance with prescribed medications will improve Ability to identify triggers associated with substance abuse/mental health issues will improve Ability to disclose and discuss suicidal ideas Compliance with prescribed medications will improve Ability to identify triggers associated with substance abuse/mental health issues will improve  Medication Management: Evaluate patient's response, side effects, and tolerance of medication regimen.  Therapeutic Interventions: 1 to 1 sessions, Unit Group sessions and Medication administration.  Evaluation of Outcomes: Adequate for Discharge   RN Treatment Plan for Primary Diagnosis: Bipolar I disorder, most recent episode depressed, severe without psychotic features ([place]) Long Term Goal(s): Knowledge of disease and therapeutic regimen to maintain health will improve  Short Term Goals: Ability to identify and develop effective coping behaviors will improve and Compliance with prescribed medications will improve  Medication Management: RN will administer medications as ordered by provider, will assess and evaluate patient's response and provide education to patient for prescribed medication. RN will report any adverse and/or side effects to prescribing provider.  Therapeutic Interventions: 1 on 1 counseling sessions, Psychoeducation, Medication administration, Evaluate responses to treatment, Monitor vital signs and CBGs as ordered, Perform/monitor CIWA, COWS, AIMS and Fall Risk screenings as ordered, Perform wound care treatments as ordered.  Evaluation of Outcomes: Adequate for Discharge   LCSW Treatment Plan for Primary Diagnosis: Bipolar I disorder, most recent episode depressed, severe without psychotic features ([place]) Long Term Goal(s): Safe transition to appropriate next level of care at discharge, Engage patient in  therapeutic group addressing interpersonal concerns.  Short Term Goals: Engage patient in aftercare planning with referrals and resources  Therapeutic Interventions: Assess for all discharge needs, 1 to 1 time with Social worker, Explore available resources and support systems, Assess for adequacy in community support network, Educate family and significant other(s) on suicide prevention, Complete Psychosocial Assessment, Interpersonal group therapy.  Evaluation of Outcomes: Met   Progress in Treatment: Attending groups: Yes Participating in groups: Yes Taking medication as prescribed: Yes Toleration medication: Yes, no side effects reported at this time Family/Significant other contact made: No, Pt declines Patient understands diagnosis: Yes AEB Discussing patient identified problems/goals with staff: Yes Medical problems stabilized or resolved: Yes Denies suicidal/homicidal ideation: Yes Issues/concerns per patient self-inventory: None Other: N/A  New problem(s) identified: None identified at this time.   New Short Term/Long Term Goal(s): None identified at this time.   Discharge Plan or Barriers: Pt will return home and follow-up with outpatient services.  MD increasing medication today for DC tomorrow.   Reason for Continuation of Hospitalization: None identified at this time.   Estimated Length of Stay: 1 day  Attendees: Patient: 08/21/2016  11:10 AM  Physician: Dr. Adele Schilder, MD 08/21/2016  11:10 AM  Nursing: Mayra Neer, Desma Paganini, RN 08/21/2016  11:10 AM  RN Care Manager:  08/21/2016  11:10 AM  Social Worker: Peri Maris 08/21/2016  11:10 AM  Recreational Therapist:  08/21/2016  11:10 AM  Other:  08/21/2016  11:10 AM  Other:  08/21/2016  11:10 AM    Scribe for Treatment Team: Adriana Reams, [place] [facility] [phone]

## 2016-08-21 NOTE — Progress Notes (Signed)
Recreation Therapy Notes  Date: 08/21/16 Time: 0930 Location: 300 Hall Dayroom  Group Topic: Stress Management  Goal Area(s) Addresses:  Patient will verbalize importance of using healthy stress management.  Patient will identify positive emotions associated with healthy stress management.   Intervention: Calm App  Activity :  Body Scan Meditation.  LRT introduced the stress management technique of meditation to the group.  LRT played a body scan meditation from the Calm App to allow patients to take notice of the stress and tension the body can hold.  Patients were to follow along with the app to participate in the activity to relieve their bodies of any tension or stress.  Education:  Stress Management, Discharge Planning.   Education Outcome: Acknowledges edcuation/In group clarification offered/Needs additional education  Clinical Observations/Feedback: Pt did not attend group.   Orlin Kann, LRT/CTRS         Eric Morganti A 08/21/2016 11:41 AM 

## 2016-08-21 NOTE — Progress Notes (Signed)
  [facility]charge [facility] :  Will you be returning to the same living situation after discharge:  Yes,  Pt returning home At discharge, do you have transportation home?: Yes,  Pt provided with bus pass Do you have the ability to pay for your medications: Yes,  Pt provided with samples and prescriptions  Release of information consent forms completed and in the chart;  Patient's signature needed at discharge.  Patient to Follow up at: Follow-up Information    [facility] Follow up on 08/27/2016.   Why:  Thursday at 4:00 with Ms Milinda AntisKnight Contact information: [address] [place] [phone]       [facility]   Specialty:  Behavioral Health Why:  Go to the walk-in clinic M-F between 8 and 11 AM for your hospital follow up appointment.  This is where Dr Delrae AlfredMulberry recommends you go to see a psychiatrist. Contact information: [address] [place] [address] [phone]        [facility] Follow up on 09/16/2016.   Why:  Wednesday at 11:00 with Dr Jari FavreSnyder Contact information: [address] [id number] [place] [id number] [phone]          Next level of care provider has [facility] Link:no  Safety Planning and Suicide Prevention discussed: Yes,  with Pt; declined SPE with support  Have you used any form of tobacco in the last 30 days? (Cigarettes, Smokeless Tobacco, Cigars, and/or Pipes): Yes  Has patient been referred to the Quitline?: Patient refused referral  Patient has been referred for addiction treatment: Yes  Edwin Martinez 08/21/2016, 11:08 AM

## 2016-08-21 NOTE — [facility] Suicide Risk Assessment (Signed)
[facility] INPATIENT:  Family/Significant Other Suicide Prevention Education  Suicide Prevention Education:  Patient Refusal for Family/Significant Other Suicide Prevention Education: The patient Edwin Martinez has refused to provide written consent for family/significant other to be provided Family/Significant Other Suicide Prevention Education during admission and/or prior to discharge.  Physician notified.  Verdene LennertLauren C Khristine Verno 08/21/2016, 11:10 AM

## 2016-08-21 NOTE — Progress Notes (Signed)
D: Patient is lethargic and drowsy this morning.  He did get up for his morning medications.  He continues to report anxiety and depression.  Patient has minimal interaction with staff as he is isolating to room.  Patient is up for discharge today.  He has occasional SI, however, he remains safe on the unit.  Patient appears anxious with sad affect. A: Initiate discharge paperwork.  Safety checks completed every 15 minutes per protocol.  Offer support and encouragement as needed. R: Patient remains isolative and withdrawn.

## 2016-08-21 NOTE — Progress Notes (Signed)
[facility]  08/21/2016 9:37 AM Edwin Martinez Martinez  MRN:  [id number]  Subjective: Patient states " I still feel very nervous and anxious.  I think my medicine needs to go up.    Objective: Edwin Martinez Martinez 30 y.o.male, who is single , unemployed , [place] , has Martinez hx of Bipolar disorder, HIV , who has had multiple suicide attempts , recently 3 weeks ago , who presented with worsening SI [facility] .  Patient seen and chart reviewed.  Patient remained anxious, depressed but reported no side effects on the medication.  He remains very isolated, withdrawn and seclusive.  He does go to the group her limited participation.  But he is not disruptive.  He admitted continues to have fleeting and passive suicidal thoughts.  Though he contracted for safety but he feel his symptoms are not fully resolved.  He is taking Risperdal, Cogentin and Lamictal.  There were no tremors or shakes.  He sleeping on and off.  His appetite is okay.  Principal Problem: Bipolar I disorder, most recent episode depressed, severe without psychotic features (HCC) Diagnosis:   Patient Active Problem List   Diagnosis Date Noted  . Bipolar I disorder, most recent episode depressed, severe without psychotic features (HCC) [F31.4] 08/18/2016  . Cocaine use disorder, mild, abuse [F14.10] 07/27/2016  . Cannabis use disorder, moderate, dependence (HCC) [F12.20] 07/27/2016  . Tobacco use disorder [F17.200] 07/27/2016  . Intentional drug overdose (HCC) [T50.902A] 07/18/2016  . Asthma [J45.909] 05/20/2007  . Human immunodeficiency virus (HIV) disease (HCC) [B20] 05/05/2007   Total Time spent with patient: 15 minutes  Past Psychiatric History: Please see H&P.  Past Medical History:  Past Medical History:  Diagnosis Date  . ADHD (attention deficit hyperactivity disorder)   . ADHD (attention deficit hyperactivity disorder) 09/12/2012  . Anxiety   . Asthma   . Bipolar 1 disorder (HCC)    . Bipolar disorder (HCC)   . Epileptic seizures (HCC)   . HIV (human immunodeficiency virus infection) (HCC)   . Hypertension   . Schizophrenia (HCC)   . Seizures (HCC)     Past Surgical History:  Procedure Laterality Date  . DENTAL SURGERY     Family History:  Family History  Problem Relation Age of Onset  . Huntington's disease Father   . Heart disease Mother   . Suicidality Maternal Uncle   . Suicidality Maternal Grandmother    Family Psychiatric  History: Please see H&P.  Social History: Please see H&P.  History  Alcohol Use  . 1.2 oz/week  . 2 Standard drinks or equivalent per week    Comment: once week      History  Drug Use  . Frequency: 2.0 times per week  . Types: Marijuana, Cocaine    Comment: every other day     Social History   Social History  . Marital status: Single    Spouse name: N/Martinez  . Number of children: N/Martinez  . Years of education: N/Martinez   Social History Main Topics  . Smoking status: Current Every Day Smoker    Packs/day: 0.50    Types: Cigarettes    Start date: 09/29/1991  . Smokeless tobacco: Never Used  . Alcohol use 1.2 oz/week    2 Standard drinks or equivalent per week     Comment: once week   . Drug use:     Frequency: 2.0 times per week  Types: Marijuana, Cocaine     Comment: every other day   . Sexual activity: Yes    Birth control/ protection: Condom   Other Topics Concern  . None   Social History Narrative   ** Merged History Encounter **       Additional Social History:   Sleep: Fair  Appetite:  Fair  Current Medications: Current Facility-Administered Medications  Medication Dose Route Frequency Provider Last Rate Last Dose  . abacavir-dolutegravir-lamiVUDine (TRIUMEQ) 600-50-300 MG per tablet 1 tablet  1 tablet Oral QHS Kristeen Mans, NP   1 tablet at 08/20/16 2135  . acetaminophen (TYLENOL) tablet 650 mg  650 mg Oral Q6H PRN Kristeen Mans, NP      . albuterol (PROVENTIL HFA;VENTOLIN HFA) 108 (90 Base) MCG/ACT  inhaler 2 puff  2 puff Inhalation Q6H PRN Kristeen Mans, NP      . benztropine (COGENTIN) tablet 1 mg  1 mg Oral QHS Jomarie Longs, MD   1 mg at 08/20/16 2126  . chlordiazePOXIDE (LIBRIUM) capsule 25 mg  25 mg Oral TID PRN Jomarie Longs, MD      . citalopram (CELEXA) tablet 20 mg  20 mg Oral Daily Kristeen Mans, NP   20 mg at 08/21/16 0825  . hydrOXYzine (ATARAX/VISTARIL) tablet 25 mg  25 mg Oral Q6H PRN Jomarie Longs, MD      . OLANZapine (ZYPREXA) tablet 10 mg  10 mg Oral TID PRN Jomarie Longs, MD       Or  . OLANZapine (ZYPREXA) injection 10 mg  10 mg Intramuscular TID PRN Jomarie Longs, MD      . risperiDONE (RISPERDAL) tablet 1 mg  1 mg Oral Daily Kristeen Mans, NP   1 mg at 08/21/16 0826  . risperiDONE (RISPERDAL) tablet 3 mg  3 mg Oral QHS Kristeen Mans, NP   3 mg at 08/20/16 2126  . traZODone (DESYREL) tablet 50 mg  50 mg Oral QHS PRN Jomarie Longs, MD   50 mg at 08/19/16 2133   Lab Results:  No results found for this or any previous visit (from the past 48 hour(s)). Blood Alcohol level:  Lab Results  Component Value Date   ETH 187 (H) 08/17/2016   ETH <5 07/18/2016   Metabolic Disorder Labs: Lab Results  Component Value Date   HGBA1C 5.5 05/04/2016   MPG 111 05/04/2016   Lab Results  Component Value Date   PROLACTIN 32.3 (H) 08/19/2016   Lab Results  Component Value Date   CHOL 140 08/19/2016   TRIG 73 08/19/2016   HDL 50 08/19/2016   CHOLHDL 2.8 08/19/2016   VLDL 15 08/19/2016   LDLCALC 75 08/19/2016   LDLCALC 53 02/05/2015    Physical Findings: AIMS: Facial and Oral Movements Muscles of Facial Expression: None, normal Lips and Perioral Area: None, normal Jaw: None, normal Tongue: None, normal,Extremity Movements Upper (arms, wrists, hands, fingers): None, normal Lower (legs, knees, ankles, toes): None, normal, Trunk Movements Neck, shoulders, hips: None, normal, Overall Severity Severity of abnormal movements (highest score from questions above):  None, normal Incapacitation due to abnormal movements: None, normal Patient's awareness of abnormal movements (rate only patient's report): No Awareness, Dental Status Current problems with teeth and/or dentures?: No Does patient usually wear dentures?: No  CIWA:  CIWA-Ar Total: 0 COWS:     Musculoskeletal: Strength & Muscle Tone: within normal limits Gait & Station: normal Patient leans: N/Martinez  Psychiatric Specialty Exam: Physical Exam  Nursing note and  vitals reviewed.   Review of Systems  Constitutional: Negative.   HENT: Negative.   Respiratory: Negative.   Cardiovascular: Negative.   Musculoskeletal: Negative.   Skin: Negative.   Neurological: Negative for dizziness.  Psychiatric/Behavioral: Positive for depression. The patient is nervous/anxious.     Blood pressure 115/79, pulse 69, temperature 97.7 F (36.5 C), temperature source Oral, resp. rate 16, height 5\' 7"  (1.702 m), weight 67.6 kg (149 lb), SpO2 100 %.Body mass index is 23.34 kg/m.  General Appearance: Casual and Disheveled  Eye Contact:  Fair  Speech:  Slow  Volume:  Decreased  Mood:  Depressed  Affect:  Depressed  Thought Process:  Goal Directed and Descriptions of Associations: Intact  Orientation:  Full (Time, Place, and Person)  Thought Content:  Rumination  Suicidal Thoughts:  Yes.  without intent/plan  Homicidal Thoughts:  No  Memory:  Immediate;   Fair Recent;   Fair Remote;   Fair  Judgement:  Impaired  Insight:  Shallow  Psychomotor Activity:  Decreased  Concentration:  Concentration: Fair and Attention Span: Fair  Recall:  Fis[employer]Fair  Fund of Knowledge:  Fair  Language:  Fair  Akathisia:  No  Handed:  Right  AIMS (if indicated):     Assets:  Desire for Improvement  ADL's:  Intact  Cognition:  WNL  Sleep:  Number of Hours: 5.75   Treatment Plan Summary: Patient denies any new concerns, making progress. Continue treatment.  Will continue today 08/21/16 plan as below except where it is  noted.  Bipolar I disorder, most recent episode depressed, severe without psychotic features (HCC) unstable - improving   Daily contact with patient to assess and evaluate symptoms and progress in treatment and Medication management For mood sx: Will continue Risperidone 1 mg po daily and 3 mg po qhs. Bili increase Lamictal 50 mg daily.  Patient has no rash, itching or any side effects.   Will contiue Celexa 20 mg po daily - home dose.  For insomnia: Will continue Trazodone 50 mg po qhs prn.  For anxiety/agitation: Will continue PRN medications as per MAR.  For HIV: Continue Triumeq PO daily at bedtime as scheduled.  His lipid panel is normal.,  His comprehensive metabolic panel is normal, Labs reviewed- on November 22, his prolactin is 32.3 , his UDS positive for cocaine and cannabis.  Encouraged to precipitate in group milieu therapy, social worker to start disposition plan.  Foster Frericks T., MD,   08/21/2016, 9:37 AMPatient ID: Edwin ClossEdward Martinez Edwin Martinez, male   DOB: [birth year], 30 y.o.   MRN: [id number]

## 2016-08-22 MED ORDER — HYDROXYZINE HCL 25 MG PO TABS: 25.0000 mg | ORAL_TABLET | Freq: Four times a day (QID) | ORAL | 0 refills | Status: DC | PRN

## 2016-08-22 MED ORDER — BENZTROPINE MESYLATE 1 MG PO TABS: 1.0000 mg | ORAL_TABLET | Freq: Every day | ORAL | 0 refills | Status: DC

## 2016-08-22 MED ORDER — RISPERIDONE 1 MG PO TABS: 1.0000 mg | ORAL_TABLET | Freq: Every day | ORAL | 0 refills | Status: DC

## 2016-08-22 MED ORDER — TRAZODONE HCL 50 MG PO TABS: 50.0000 mg | ORAL_TABLET | Freq: Every evening | ORAL | 0 refills | Status: DC | PRN

## 2016-08-22 MED ORDER — CITALOPRAM HYDROBROMIDE 20 MG PO TABS: 20.0000 mg | ORAL_TABLET | Freq: Every day | ORAL | 0 refills | Status: DC

## 2016-08-22 NOTE — Progress Notes (Signed)
D: Pt was in the day room upon initial approach.  Pt presents with appropriate affect and pleasant mood.  His goal is to "take and stay on my meds."  Pt reports he has "been doing really wonderful."  Pt reports he discharges tomorrow and he feels safe to do so.  Pt denies SI/HI, denies hallucinations, denies pain.  Pt has been visible in milieu interacting with peers and staff appropriately.  Pt attended evening group.   A: Introduced self to pt.  Actively listened to pt and offered support and encouragement. Medications administered per order.  PRN medication administered for sleep. R: Pt is safe on the unit.  Pt is compliant with medications.  Pt verbally contracts for safety.  Will continue to monitor and assess.

## 2016-08-22 NOTE — Progress Notes (Signed)
[facility] without incident. Denied SI/HI/AVH and in a jovial mood. All personal belongings returned to pt. AVS, SRA, BH Transition Record given to pt.

## 2016-08-22 NOTE — [facility] Group Notes (Signed)
Adult Group Therapy Note  Date:  08/22/2016  Time: 9:00AM-10:00AM  Group Topic/Focus: Today's group focused on the topic of healthy and unhealthy coping skills.  Commonalities were found in group members, including self-harm, isolation, anger outbursts, throwing things, and suicide attempts.  The reasons unhealthy coping skills are used were listed by group members, with a focus on them being fast, easy, and initially working.  The down sides were also discussed at length.  At the end of group, patients chose stationery on which they were assigned the task of writing a goodbye letter to an unhealthy coping technique that they believe they want to stop using.  Participation Level:  Active  Participation Quality:  Monopolizing and Resistant  Affect:  Appropriate  Cognitive:  Alert  Insight: Improving  Engagement in Group:  Improving  Modes of Intervention:  Activity, Discussion and Support  Additional Comments:  The patient expressed that an unhealthy coping technique he uses is throwing and breaking objects, while a healthy coping technique he uses is "debate."  He was argumentative and oppositional in the first 10 minutes of group, and CSW did state that therapy group is a place for discussion and sharing rather than debate.  He did become more appropriate.  Carloyn JaegerMareida J Grossman-Orr 08/22/2016 , 10:22 AM

## 2016-08-22 NOTE — [facility] Suicide Risk Assessment (Signed)
[facility]   Principal Problem: Bipolar I disorder, most recent episode depressed, severe without psychotic features [facility](HCC) Discharge Diagnoses:  Patient Active Problem List   Diagnosis Date Noted  . Bipolar I disorder, most recent episode depressed, severe without psychotic features (HCC) [F31.4] 08/18/2016  . Cocaine use disorder, mild, abuse [F14.10] 07/27/2016  . Cannabis use disorder, moderate, dependence (HCC) [F12.20] 07/27/2016  . Tobacco use disorder [F17.200] 07/27/2016  . Intentional drug overdose (HCC) [T50.902A] 07/18/2016  . Asthma [J45.909] 05/20/2007  . Human immunodeficiency virus (HIV) disease (HCC) [B20] 05/05/2007    Total Time spent with patient: 30 minutes  Musculoskeletal: Strength & Muscle Tone: within normal limits Gait & Station: normal Patient leans: N/A  Psychiatric Specialty Exam: ROS  Blood pressure 101/67, pulse 75, temperature 97.5 F (36.4 C), temperature source Oral, resp. rate 18, height 5\' 7"  (1.702 m), weight 67.6 kg (149 lb), SpO2 100 %.Body mass index is 23.34 kg/m.  General Appearance: Disheveled  Eye Contact::  Good  Speech:  Clear and Coherent409  Volume:  Normal  Mood:  Anxious  Affect:  Appropriate and Congruent  Thought Process:  Coherent and Goal Directed  Orientation:  Full (Time, Place, and Person)  Thought Content:  WDL and Logical  Suicidal Thoughts:  No  Homicidal Thoughts:  No  Memory:  Immediate;   Good Recent;   Good Remote;   Good  Judgement:  Good  Insight:  Good  Psychomotor Activity:  Normal  Concentration:  Good  Recall:  Good  Fund of Knowledge:Good  Language: Good  Akathisia:  No  Handed:  Right  AIMS (if indicated):     Assets:  Communication Skills Desire for Improvement Housing Physical Health  Sleep:  Number of Hours: 6.75  Cognition: WNL  ADL's:  Intact   Mental Status Per Nursing Assessment::   On Admission:  Suicidal ideation indicated by patient, Suicide plan,  Self-harm thoughts, Self-harm behaviors  Demographic Factors:  Low socioeconomic status and Unemployed  Loss Factors: Decrease in vocational status  Historical Factors: Prior suicide attempts and Impulsivity  Risk Reduction Factors:   Religious beliefs about death, Positive social support, Positive therapeutic relationship and Positive coping skills or problem solving skills  Continued Clinical Symptoms:  Alcohol/Substance Abuse/Dependencies Unstable or Poor Therapeutic Relationship Previous Psychiatric Diagnoses and Treatments Medical Diagnoses and Treatments/Surgeries  Cognitive Features That Contribute To Risk:  None    Suicide Risk:  Minimal: No identifiable suicidal ideation.  Patients presenting with no risk factors but with morbid ruminations; may be classified as minimal risk based on the severity of the depressive symptoms  Follow-up Information    [facility] Follow up on 08/27/2016.   Why:  Thursday at 4:00 with Ms Milinda AntisKnight Contact information: [address] [place] [phone]       [facility]   Specialty:  Behavioral Health Why:  Go to the walk-in clinic M-F between 8 and 11 AM for your hospital follow up appointment.  This is where Dr Delrae AlfredMulberry recommends you go to see a psychiatrist. Contact information: [address] [place] [address] [phone]        [facility] Follow up on 09/16/2016.   Why:  Wednesday at 11:00 with Dr Jari FavreSnyder Contact information: [address] [id number] mc [place] [phone]          Plan Of Care/Follow-up recommendations:  Activity:  As tolerated Diet:  Unchanged from the  past Other:  Patient is doing better on his current psychiatric medication.  He does not have any suicidal thoughts.  He feels ready to be discharged.  He will follow up with his HIV clinic and [facility] for her psychiatric services.  Please  see discharge summary for more details.  Kieran Arreguin T., MD 08/22/2016, 9:18 AM

## 2016-08-22 NOTE — Discharge Summary (Signed)
Physician Discharge Summary Note  Patient:  Edwin Martinez is an 30 y.o., male MRN:  [id number] DOB:  08-19-1986 Patient phone:  [phone] (home)  Patient address:   [address] [address] [place] [id number],  Total Time spent with patient: 30 minutes  Date of Admission:  08/17/2016 Date of Discharge: 08/22/2016  Reason for Admission:PER HPI-Edwin Martinez a 30 y.o.male, who is single , unemployed , [place] self , has a hx of Bipolar disorder, HIV , who has had multiple suicide attempts , recently 3 weeks ago , who presented with worsening SI [facility] . Per initial notes in EHR : 'Pt restrained to bed via handcuffs and medicated due to disruptive aggressive behaviors. Pt presents to hospital via [facility] after stating to boyfriend during argument "what if I kill myself". Pt reports verbalizing question but, denies Suicidal ideation, plan, and intent. Pt reports history of suicide attempt. Pt denies homicidal ideation and thoughts of harm towards others.  Pt [name] for [facility] services. Pt states he is not aware if he is prescribed any psychiatric medications at this time. Pt denies history of abuse.Per chart- [facility] after treatment for bipolar d/o and suicide attempt (50 tablets of Depakote, 30 tablets ofNeurontin, 20 trazodone, 15-20 Prozac and 15-20 Naproxen)."  Principal Problem: Bipolar I disorder, most recent episode depressed, severe without psychotic features [facility](HCC) Discharge Diagnoses: Patient Active Problem List   Diagnosis Date Noted  . Bipolar I disorder, most recent episode depressed, severe without psychotic features (HCC) [F31.4] 08/18/2016  . Cocaine use disorder, mild, abuse [F14.10] 07/27/2016  . Cannabis use disorder, moderate, dependence (HCC) [F12.20] 07/27/2016  . Tobacco use disorder [F17.200] 07/27/2016  . Intentional drug overdose (HCC) [T50.902A]  07/18/2016  . Asthma [J45.909] 05/20/2007  . Human immunodeficiency virus (HIV) disease (HCC) [B20] 05/05/2007    Past Psychiatric History:  Past Medical History:  Past Medical History:  Diagnosis Date  . ADHD (attention deficit hyperactivity disorder)   . ADHD (attention deficit hyperactivity disorder) 09/12/2012  . Anxiety   . Asthma   . Bipolar 1 disorder (HCC)   . Bipolar disorder (HCC)   . Epileptic seizures (HCC)   . HIV (human immunodeficiency virus infection) (HCC)   . Hypertension   . Schizophrenia (HCC)   . Seizures (HCC)     Past Surgical History:  Procedure Laterality Date  . DENTAL SURGERY     Family History:  Family History  Problem Relation Age of Onset  . Huntington's disease Father   . Heart disease Mother   . Suicidality Maternal Uncle   . Suicidality Maternal Grandmother    Family Psychiatric  History:  Social History:  History  Alcohol Use  . 1.2 oz/week  . 2 Standard drinks or equivalent per week    Comment: once week      History  Drug Use  . Frequency: 2.0 times per week  . Types: Marijuana, Cocaine    Comment: every other day     Social History   Social History  . Marital status: Single    Spouse name: N/A  . Number of children: N/A  . Years of education: N/A   Social History Main Topics  . Smoking status: Current Every Day Smoker    Packs/day: 0.50    Types: Cigarettes    Start date: 09/29/1991  . Smokeless tobacco: Never Used  . Alcohol use 1.2  oz/week    2 Standard drinks or equivalent per week     Comment: once week   . Drug use:     Frequency: 2.0 times per week    Types: Marijuana, Cocaine     Comment: every other day   . Sexual activity: Yes    Birth control/ protection: Condom   Other Topics Concern  . None   Social History Narrative   ** Merged History Encounter **        Hospital Course:  Edwin Martinez was admitted for Bipolar I disorder, most recent episode depressed, severe without psychotic features  (HCC)  and crisis management.  Pt was treated discharged with the medications listed below under Medication List.  Medical problems were identified and treated as needed.  Home medications were restarted as appropriate.  Improvement was monitored by observation and Edwin Martinez 's daily report of symptom reduction.  Emotional and mental status was monitored by daily self-inventory reports completed by Edwin Martinez and clinical staff.         Edwin Martinez was evaluated by the treatment team for stability and plans for continued recovery upon discharge. Edwin Martinez 's motivation was an integral factor for scheduling further treatment. Employment, transportation, bed availability, health status, family support, and any pending legal issues were also considered during hospital stay. Pt was offered further treatment options upon discharge including but not limited to Residential, Intensive Outpatient, and Outpatient treatment.  Edwin Martinez will follow up with the services as listed below under Follow Up Information.     Upon completion of this admission the patient was both mentally and medically stable for discharge denying suicidal/homicidal ideation, auditory/visual/tactile hallucinations, delusional thoughts and paranoia.     Edwin Martinez responded well to treatment with cogentin 1mg  Celexa 20 mg, Vistaril 25mg , and Risperdal 1 mg without adverse effects. Initially, pt did complain of  with these medications, but this resolved. Pt demonstrated improvement without reported or observed adverse effects to the point of stability appropriate for outpatient management. Pertinent labs include Prolactin 32.3 (high), for which outpatient follow-up is necessary for lab recheck as mentioned below. Reviewed CBC, CMP, BAL, and UDS + Cocain and THC on admission ; all unremarkable aside from noted exceptions.   Physical Findings: AIMS: Facial and Oral Movements Muscles of Facial Expression:  None, normal Lips and Perioral Area: None, normal Jaw: None, normal Tongue: None, normal,Extremity Movements Upper (arms, wrists, hands, fingers): None, normal Lower (legs, knees, ankles, toes): None, normal, Trunk Movements Neck, shoulders, hips: None, normal, Overall Severity Severity of abnormal movements (highest score from questions above): None, normal Incapacitation due to abnormal movements: None, normal Patient's awareness of abnormal movements (rate only patient's report): No Awareness, Dental Status Current problems with teeth and/or dentures?: No Does patient usually wear dentures?: No  CIWA:  CIWA-Ar Total: 0 COWS:     Musculoskeletal: Strength & Muscle Tone: within normal limits Gait & Station: normal Patient leans: N/A  Psychiatric Specialty Exam: See SRA By MD Physical Exam  Nursing note and vitals reviewed. Constitutional: He is oriented to person, place, and time. He appears well-developed and well-nourished.  Cardiovascular: Normal rate and regular rhythm.   Neurological: He is alert and oriented to person, place, and time.  Psychiatric: He has a normal mood and affect. His behavior is normal.    Review of Systems  Psychiatric/Behavioral: Positive for suicidal ideas. Negative for depression (stable). The patient is not nervous/anxious.  Blood pressure 101/67, pulse 75, temperature 97.5 F (36.4 C), temperature source Oral, resp. rate 18, height 5\' 7"  (1.702 m), weight 67.6 kg (149 lb), SpO2 100 %.Body mass index is 23.34 kg/m.    Have you used any form of tobacco in the last 30 days? (Cigarettes, Smokeless Tobacco, Cigars, and/or Pipes): Yes  Has this patient used any form of tobacco in the last 30 days? (Cigarettes, Smokeless Tobacco, Cigars, and/or Pipes)  No  Blood Alcohol level:  Lab Results  Component Value Date   ETH 187 (H) 08/17/2016   ETH <5 07/18/2016    Metabolic Disorder Labs:  Lab Results  Component Value Date   HGBA1C 5.5 05/04/2016    MPG 111 05/04/2016   Lab Results  Component Value Date   PROLACTIN 32.3 (H) 08/19/2016   Lab Results  Component Value Date   CHOL 140 08/19/2016   TRIG 73 08/19/2016   HDL 50 08/19/2016   CHOLHDL 2.8 08/19/2016   VLDL 15 08/19/2016   LDLCALC 75 08/19/2016   LDLCALC 53 02/05/2015    See Psychiatric Specialty Exam and Suicide Risk Assessment completed by Attending Physician prior to discharge.  Discharge destination:  Home  Is patient on multiple antipsychotic therapies at discharge:  No   Has Patient had three or more failed trials of antipsychotic monotherapy by history:  No  Recommended Plan for Multiple Antipsychotic Therapies: NA  Discharge Instructions    Diet - low sodium heart healthy    Complete by:  As directed    Discharge instructions    Complete by:  As directed    Take all medications as prescribed. Keep all follow-up appointments as scheduled.  Do not consume alcohol or use illegal drugs while on prescription medications. Report any adverse effects from your medications to your primary care provider promptly.  In the event of recurrent symptoms or worsening symptoms, call 911, a crisis hotline, or go to the nearest emergency department for evaluation.   Increase activity slowly    Complete by:  As directed        Medication List    TAKE these medications     Indication  albuterol 108 (90 Base) MCG/ACT inhaler Commonly known as:  PROVENTIL HFA;VENTOLIN HFA Inhale 2 puffs into the lungs every 6 (six) hours as needed for wheezing or shortness of breath.  Indication:  Disease Involving Spasms of the Bronchus   benztropine 1 MG tablet Commonly known as:  COGENTIN Take 1 tablet (1 mg total) by mouth at bedtime.  Indication:  Extrapyramidal Reaction caused by Medications   citalopram 20 MG tablet Commonly known as:  CELEXA Take 1 tablet (20 mg total) by mouth daily. Start taking on:  08/23/2016  Indication:  bipolar depression   hydrOXYzine 25 MG  tablet Commonly known as:  ATARAX/VISTARIL Take 1 tablet (25 mg total) by mouth every 6 (six) hours as needed for anxiety.  Indication:  Anxiety associated with Organic Disease   risperiDONE 1 MG tablet Commonly known as:  RISPERDAL Take 1 tablet (1 mg total) by mouth daily. Take 3 tablet  (3mg  total) by mouth at bed time Start taking on:  08/23/2016 What changed:  additional instructions  Another medication with the same name was removed. Continue taking this medication, and follow the directions you see here.  Indication:  Manic-Depression   traZODone 50 MG tablet Commonly known as:  DESYREL Take 1 tablet (50 mg total) by mouth at bedtime as needed for sleep.  Indication:  Anxiety  Disorder   TRIUMEQ 600-50-300 MG tablet Generic drug:  abacavir-dolutegravir-lamiVUDine Take 1 tablet by mouth at bedtime.  Indication:  HIV Disease      Follow-up Information    [facility] Follow up on 08/27/2016.   Why:  Thursday at 4:00 with Ms Milinda Antis information: [address] [place] [address] [phone]       [facility] Follow up.   Specialty:  Behavioral Health Why:  Go to the walk-in clinic M-F between 8 and 11 AM for your hospital follow up appointment.  This is where Dr Delrae Alfred recommends you go to see a psychiatrist. Contact information: [address] [place] [address] [phone]        [facility] Follow up on 09/16/2016.   Why:  Wednesday at 11:00 with Dr Jari Favre information: [id number] [place] [address] [phone]          Follow-up recommendations:  Activity:  as tolerated Diet:  heart healthy  Comments:  Take all medications as prescribed. Keep all follow-up appointments as scheduled.  Do not consume alcohol or use illegal drugs while on prescription medications. Report any adverse effects from your medications to your primary care provider  promptly.  In the event of recurrent symptoms or worsening symptoms, call 911, a crisis hotline, or go to the nearest emergency department for evaluation.   Signed: Oneta Rack, NP 08/22/2016, 9:33 AM

## 2016-08-22 NOTE — Progress Notes (Signed)
Pt notify not to take food back from cafeteria. Pt put the food under his arm and take the food back from cafeteria. RN notify.

## 2016-08-27 ENCOUNTER — Other Ambulatory Visit: Payer: Self-pay | Admitting: Licensed Clinical Social Worker

## 2016-09-04 ENCOUNTER — Emergency Department (HOSPITAL_COMMUNITY)
Admission: EM | Admit: 2016-09-04 | Discharge: 2016-09-06 | Disposition: A | Discharge status: Home / Self Care | Payer: No Typology Code available for payment source | Attending: Emergency Medicine | Admitting: Emergency Medicine

## 2016-09-04 DIAGNOSIS — F1410 Cocaine abuse, uncomplicated: Secondary | ICD-10-CM | POA: Insufficient documentation

## 2016-09-04 DIAGNOSIS — F3131 Bipolar disorder, current episode depressed, mild: Secondary | ICD-10-CM | POA: Insufficient documentation

## 2016-09-04 DIAGNOSIS — Z91010 Allergy to peanuts: Secondary | ICD-10-CM | POA: Insufficient documentation

## 2016-09-04 DIAGNOSIS — Z79899 Other long term (current) drug therapy: Secondary | ICD-10-CM | POA: Insufficient documentation

## 2016-09-04 DIAGNOSIS — J45909 Unspecified asthma, uncomplicated: Secondary | ICD-10-CM | POA: Insufficient documentation

## 2016-09-04 DIAGNOSIS — F314 Bipolar disorder, current episode depressed, severe, without psychotic features: Secondary | ICD-10-CM | POA: Diagnosis present

## 2016-09-04 DIAGNOSIS — F17210 Nicotine dependence, cigarettes, uncomplicated: Secondary | ICD-10-CM | POA: Insufficient documentation

## 2016-09-04 DIAGNOSIS — R45851 Suicidal ideations: Secondary | ICD-10-CM

## 2016-09-04 DIAGNOSIS — F909 Attention-deficit hyperactivity disorder, unspecified type: Secondary | ICD-10-CM | POA: Insufficient documentation

## 2016-09-04 DIAGNOSIS — I10 Essential (primary) hypertension: Secondary | ICD-10-CM | POA: Insufficient documentation

## 2016-09-04 LAB — CBC
HCT: 40.4 % (ref 39.0–52.0)
Hemoglobin: 13.7 g/dL (ref 13.0–17.0)
MCH: 29 pg (ref 26.0–34.0)
MCHC: 33.9 g/dL (ref 30.0–36.0)
MCV: 85.6 fL (ref 78.0–100.0)
Platelets: 279 10*3/uL (ref 150–400)
RBC: 4.72 MIL/uL (ref 4.22–5.81)
RDW: 13.5 % (ref 11.5–15.5)
WBC: 10.8 10*3/uL — ABNORMAL HIGH (ref 4.0–10.5)

## 2016-09-04 LAB — COMPREHENSIVE METABOLIC PANEL
ALK PHOS: 72 U/L (ref 38–126)
ALT: 15 U/L — ABNORMAL LOW (ref 17–63)
ANION GAP: 7 (ref 5–15)
AST: 19 U/L (ref 15–41)
Albumin: 4 g/dL (ref 3.5–5.0)
BILIRUBIN TOTAL: 0.5 mg/dL (ref 0.3–1.2)
BUN: 17 mg/dL (ref 6–20)
CALCIUM: 8.9 mg/dL (ref 8.9–10.3)
CO2: 24 mmol/L (ref 22–32)
Chloride: 107 mmol/L (ref 101–111)
Creatinine, Ser: 1.26 mg/dL — ABNORMAL HIGH (ref 0.61–1.24)
Glucose, Bld: 103 mg/dL — ABNORMAL HIGH (ref 65–99)
Potassium: 4.1 mmol/L (ref 3.5–5.1)
SODIUM: 138 mmol/L (ref 135–145)
TOTAL PROTEIN: 7.1 g/dL (ref 6.5–8.1)

## 2016-09-04 LAB — RAPID URINE DRUG SCREEN, HOSP PERFORMED
Amphetamines: NOT DETECTED
Barbiturates: NOT DETECTED
Benzodiazepines: NOT DETECTED
COCAINE: POSITIVE — AB
OPIATES: NOT DETECTED
Tetrahydrocannabinol: NOT DETECTED

## 2016-09-04 LAB — ACETAMINOPHEN LEVEL

## 2016-09-04 LAB — SALICYLATE LEVEL

## 2016-09-04 LAB — ETHANOL

## 2016-09-04 MED ORDER — RISPERIDONE 1 MG PO TABS
1.0000 mg | ORAL_TABLET | Freq: Every day | ORAL | Status: DC
  Administered 2016-09-04 – 2016-09-06 (×3): 1 mg via ORAL
  Filled 2016-09-04 (×3): qty 1

## 2016-09-04 MED ORDER — NICOTINE 21 MG/24HR TD PT24
21.0000 mg | MEDICATED_PATCH | Freq: Every day | TRANSDERMAL | Status: DC
  Filled 2016-09-04 (×3): qty 1

## 2016-09-04 MED ORDER — BENZTROPINE MESYLATE 1 MG PO TABS
1.0000 mg | ORAL_TABLET | Freq: Every day | ORAL | Status: DC
  Administered 2016-09-04 – 2016-09-05 (×2): 1 mg via ORAL
  Filled 2016-09-04 (×2): qty 1

## 2016-09-04 MED ORDER — ALBUTEROL SULFATE HFA 108 (90 BASE) MCG/ACT IN AERS
2.0000 | INHALATION_SPRAY | Freq: Four times a day (QID) | RESPIRATORY_TRACT | Status: DC | PRN
  Administered 2016-09-04: 2 via RESPIRATORY_TRACT
  Filled 2016-09-04: qty 6.7

## 2016-09-04 MED ORDER — HYDROXYZINE HCL 25 MG PO TABS: 25.0000 mg | ORAL_TABLET | Freq: Four times a day (QID) | ORAL | Status: DC | PRN

## 2016-09-04 MED ORDER — CETAPHIL MOISTURIZING EX LOTN
TOPICAL_LOTION | Freq: Two times a day (BID) | CUTANEOUS | Status: DC
  Administered 2016-09-04: 1 via TOPICAL
  Filled 2016-09-04 (×2): qty 473

## 2016-09-04 MED ORDER — ACETAMINOPHEN 325 MG PO TABS: 650.0000 mg | ORAL_TABLET | ORAL | Status: DC | PRN

## 2016-09-04 MED ORDER — ZOLPIDEM TARTRATE 5 MG PO TABS: 5.0000 mg | ORAL_TABLET | Freq: Every evening | ORAL | Status: DC | PRN

## 2016-09-04 MED ORDER — ONDANSETRON HCL 4 MG PO TABS: 4.0000 mg | ORAL_TABLET | Freq: Three times a day (TID) | ORAL | Status: DC | PRN

## 2016-09-04 MED ORDER — SODIUM CHLORIDE 0.9 % IV BOLUS (SEPSIS)
1000.0000 mL | Freq: Once | INTRAVENOUS | Status: AC
  Administered 2016-09-04: 1000 mL via INTRAVENOUS

## 2016-09-04 MED ORDER — CITALOPRAM HYDROBROMIDE 10 MG PO TABS
20.0000 mg | ORAL_TABLET | Freq: Every day | ORAL | Status: DC
  Administered 2016-09-04 – 2016-09-06 (×3): 20 mg via ORAL
  Filled 2016-09-04 (×3): qty 2

## 2016-09-04 MED ORDER — ABACAVIR-DOLUTEGRAVIR-LAMIVUD 600-50-300 MG PO TABS
1.0000 | ORAL_TABLET | Freq: Every day | ORAL | Status: DC
  Administered 2016-09-04 – 2016-09-05 (×2): 1 via ORAL
  Filled 2016-09-04 (×2): qty 1

## 2016-09-04 NOTE — ED Notes (Signed)
Counselor at bedside.

## 2016-09-04 NOTE — ED Notes (Signed)
Pt. Transferred to [facility] from ED to room 34 after screening for contraband. Report to include Situation, Background, Assessment and Recommendations from [employer] RN. Pt. Oriented to unit including Q15 minute rounds as well as the security cameras for their protection. Patient is alert and oriented, warm and dry in no acute distress. Patient denies HI, and AH. Pt. States he has SI and intermittently hears voices without command. Pt. Encouraged to let me know if needs arise.

## 2016-09-04 NOTE — ED Provider Notes (Signed)
[facility]-EMERGENCY DEPT Provider Note   CSN: [id number] Arrival date & time: 09/04/16  1859     History   Chief Complaint No chief complaint on file.   HPI Edwin Martinez is a 30 y.o. male.  HPI  30 year old male with a history of bipolar disorder, schizophrenia and HIV presents with suicidal thoughts. He states they started yesterday but became much worse today. This is a recurrent issue for him. He has a plan to jump out of front of a truck to kill himself. He was recently admitted and discharged 2 weeks ago. He has not been taking his meds since due to insurance issues. Patient also is complaining of bilateral redness in his armpits and dryness. He has not seen any drainage. He states this occurs to him a couple times a year. Otherwise denies fevers, diarrhea, vomiting, or other acute illness.  Past Medical History:  Diagnosis Date  . ADHD (attention deficit hyperactivity disorder)   . ADHD (attention deficit hyperactivity disorder) 09/12/2012  . Anxiety   . Asthma   . Bipolar 1 disorder (HCC)   . Bipolar disorder (HCC)   . Epileptic seizures (HCC)   . HIV (human immunodeficiency virus infection) (HCC)   . Hypertension   . Schizophrenia (HCC)   . Seizures [facility](HCC)     Patient Active Problem List   Diagnosis Date Noted  . Bipolar I disorder, most recent episode depressed, severe without psychotic features (HCC) 08/18/2016  . Cocaine use disorder, mild, abuse 07/27/2016  . Cannabis use disorder, moderate, dependence (HCC) 07/27/2016  . Tobacco use disorder 07/27/2016  . Intentional drug overdose (HCC) 07/18/2016  . Asthma 05/20/2007  . Human immunodeficiency virus (HIV) disease (HCC) 05/05/2007    Past Surgical History:  Procedure Laterality Date  . DENTAL SURGERY         Home Medications    Prior to Admission medications   Medication Sig Start Date End Date Taking? Authorizing Provider  abacavir-dolutegravir-lamiVUDine (TRIUMEQ) 600-50-300 MG tablet Take 1 tablet  by mouth at bedtime.   Yes Historical Provider, MD  albuterol (PROVENTIL HFA;VENTOLIN HFA) 108 (90 Base) MCG/ACT inhaler Inhale 2 puffs into the lungs every 6 (six) hours as needed for wheezing or shortness of breath. 07/29/16  Yes Jimmy FootmanAndrea Hernandez-Gonzalez, MD  benztropine (COGENTIN) 1 MG tablet Take 1 tablet (1 mg total) by mouth at bedtime. 08/22/16  Yes Oneta Rackanika N Lewis, NP  citalopram (CELEXA) 20 MG tablet Take 1 tablet (20 mg total) by mouth daily. 08/23/16  Yes Oneta Rackanika N Lewis, NP  hydrOXYzine (ATARAX/VISTARIL) 25 MG tablet Take 1 tablet (25 mg total) by mouth every 6 (six) hours as needed for anxiety. 08/22/16  Yes Oneta Rackanika N Lewis, NP  risperiDONE (RISPERDAL) 1 MG tablet Take 1 tablet (1 mg total) by mouth daily. Take 3 tablet  (3mg  total) by mouth at bed time 08/23/16  Yes Oneta Rackanika N Lewis, NP  traZODone (DESYREL) 50 MG tablet Take 1 tablet (50 mg total) by mouth at bedtime as needed for sleep. 08/22/16  Yes Oneta Rackanika N Lewis, NP    Family History Family History  Problem Relation Age of Onset  . Huntington's disease Father   . Heart disease Mother   . Suicidality Maternal Uncle   . Suicidality Maternal Grandmother     Social History Social History  Substance Use Topics  . Smoking status: Current Every Day Smoker    Packs/day: 0.50    Types: Cigarettes    Start date: 09/29/1991  . Smokeless tobacco: Never Used  .  Alcohol use 1.2 oz/week    2 Standard drinks or equivalent per week     Comment: once week      Allergies   Magnesium-containing compounds; Peanut-containing drug products; Atripla [efavirenz-emtricitab-tenofovir]; Esomeprazole magnesium; Bactrim [sulfamethoxazole-trimethoprim]; and Penicillins   Review of Systems Review of Systems  Constitutional: Negative for fever.  Gastrointestinal: Negative for diarrhea and vomiting.  Skin: Positive for rash.  Psychiatric/Behavioral: Positive for dysphoric mood and suicidal ideas. Negative for self-injury.  All other systems  reviewed and are negative.    Physical Exam Updated Vital Signs BP 149/72 (BP Location: Left Arm)   Pulse 67   Temp 98.4 F (36.9 C) (Oral)   Resp 20   Ht 5\' 7"  (1.702 m)   Wt 155 lb (70.3 kg)   SpO2 97%   BMI 24.28 kg/m   Physical Exam  Constitutional: He is oriented to person, place, and time. He appears well-developed and well-nourished. No distress.  HENT:  Head: Normocephalic and atraumatic.  Right Ear: External ear normal.  Left Ear: External ear normal.  Nose: Nose normal.  Eyes: Right eye exhibits no discharge. Left eye exhibits no discharge.  Neck: Neck supple.  Cardiovascular: Normal rate, regular rhythm and normal heart sounds.   Pulmonary/Chest: Effort normal and breath sounds normal.  Abdominal: Soft. There is no tenderness.  Musculoskeletal: He exhibits no edema.  Neurological: He is alert and oriented to person, place, and time.  Skin: Skin is warm and dry. He is not diaphoretic. There is erythema.  Mild erythema to bilateral axillae consistent with dermatitis. No abscess, induration or fluctuance  Psychiatric: He expresses suicidal ideation. He expresses suicidal plans.  Nursing note and vitals reviewed.    ED Treatments / Results  Labs (all labs ordered are listed, but only abnormal results are displayed) Labs Reviewed  COMPREHENSIVE METABOLIC PANEL - Abnormal; Notable for the following:       Result Value   Glucose, Bld 103 (*)    Creatinine, Ser 1.26 (*)    ALT 15 (*)    All other components within normal limits  ACETAMINOPHEN LEVEL - Abnormal; Notable for the following:    Acetaminophen (Tylenol), Serum <10 (*)    All other components within normal limits  CBC - Abnormal; Notable for the following:    WBC 10.8 (*)    All other components within normal limits  RAPID URINE DRUG SCREEN, HOSP PERFORMED - Abnormal; Notable for the following:    Cocaine POSITIVE (*)    All other components within normal limits  ETHANOL  SALICYLATE LEVEL     EKG  EKG Interpretation None       Radiology No results found.  Procedures Procedures (including critical care time)  Medications Ordered in ED Medications  cetaphil (CETAPHIL) lotion (1 application Topical Given 09/04/16 2203)  nicotine (NICODERM CQ - dosed in mg/24 hours) patch 21 mg (21 mg Transdermal Not Given 09/04/16 2205)  ondansetron (ZOFRAN) tablet 4 mg (not administered)  zolpidem (AMBIEN) tablet 5 mg (not administered)  acetaminophen (TYLENOL) tablet 650 mg (not administered)  abacavir-dolutegravir-lamiVUDine (TRIUMEQ) 600-50-300 MG per tablet 1 tablet (1 tablet Oral Given 09/04/16 2203)  albuterol (PROVENTIL HFA;VENTOLIN HFA) 108 (90 Base) MCG/ACT inhaler 2 puff (2 puffs Inhalation Given 09/04/16 2227)  benztropine (COGENTIN) tablet 1 mg (1 mg Oral Given 09/04/16 2202)  citalopram (CELEXA) tablet 20 mg (20 mg Oral Given 09/04/16 2202)  hydrOXYzine (ATARAX/VISTARIL) tablet 25 mg (not administered)  risperiDONE (RISPERDAL) tablet 1 mg (1 mg Oral Given 09/04/16  2202)  sodium chloride 0.9 % bolus 1,000 mL (0 mLs Intravenous Stopped 09/04/16 2228)     Initial Impression / Assessment and Plan / ED Course  I have reviewed the triage vital signs and the nursing notes.  Pertinent labs & imaging results that were available during my care of the patient were reviewed by me and considered in my medical decision making (see chart for details).  Clinical Course     Patient with SI. This is a recurrent issue. Also with mild bump in creatinine, unclear cause. Will give IV fluids, encourage increased PO intake. Not high enough or increased enough bump to be AKI. Overall well appearing. No infectious symptoms. Stable for psych consult  Final Clinical Impressions(s) / ED Diagnoses   Final diagnoses:  Suicidal ideation    New Prescriptions New Prescriptions   No medications on file     Pricilla Loveless, MD 09/05/16 [phone]
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
## 2016-09-04 NOTE — Progress Notes (Signed)
Referral submitted to the following Hospitals: [facility], [facility], [facility], [facility], [place], [address] [place], [address] [place], [facility], [address] [address], [place], [place], [place], [place], [place], [address] [address], [facility] [place], [place] [facility] [place], [place], [place], [place], [place], [place], [place], [facility], [place] [facility] K. Sherlon HandingHarris, LCAS-A, LPC-A, [facility]  Counselor 09/04/2016 10:29 PM

## 2016-09-04 NOTE — ED Notes (Signed)
WILL TRANSPORT PT TO [id number]. AAOX4. PT IN NO APPARENT DISTRESS OR PAIN. THE OPPORTUNITY TO ASK QUESTIONS WAS PROVIDED.

## 2016-09-04 NOTE — ED Triage Notes (Signed)
Pt is brought in by [employer] after his ex called and asked for the pt to have transportation to a homeless shelter. After [employer] arrived pt stated that he was having suicidal thoughts, he didn't have a support system, and he need to speak with someone.

## 2016-09-05 DIAGNOSIS — Z91010 Allergy to peanuts: Secondary | ICD-10-CM

## 2016-09-05 DIAGNOSIS — Z8489 Family history of other specified conditions: Secondary | ICD-10-CM | POA: Diagnosis not present

## 2016-09-05 DIAGNOSIS — Z8249 Family history of ischemic heart disease and other diseases of the circulatory system: Secondary | ICD-10-CM | POA: Diagnosis not present

## 2016-09-05 DIAGNOSIS — F1410 Cocaine abuse, uncomplicated: Secondary | ICD-10-CM | POA: Diagnosis not present

## 2016-09-05 DIAGNOSIS — F3131 Bipolar disorder, current episode depressed, mild: Secondary | ICD-10-CM | POA: Diagnosis not present

## 2016-09-05 DIAGNOSIS — Z79899 Other long term (current) drug therapy: Secondary | ICD-10-CM | POA: Diagnosis not present

## 2016-09-05 DIAGNOSIS — Z880 Allergy status to penicillin: Secondary | ICD-10-CM

## 2016-09-05 DIAGNOSIS — F17210 Nicotine dependence, cigarettes, uncomplicated: Secondary | ICD-10-CM

## 2016-09-05 DIAGNOSIS — R45851 Suicidal ideations: Secondary | ICD-10-CM

## 2016-09-05 MED ORDER — CETAPHIL MOISTURIZING EX CREA
TOPICAL_CREAM | Freq: Two times a day (BID) | CUTANEOUS | Status: DC
  Filled 2016-09-05 (×18): qty 454

## 2016-09-05 MED ORDER — CETAPHIL MOISTURIZING EX CREA
TOPICAL_CREAM | Freq: Two times a day (BID) | CUTANEOUS | Status: DC
  Filled 2016-09-05: qty 454

## 2016-09-05 NOTE — ED Notes (Signed)
Hourly rounding reveals patient sleeping in room. No complaints, stable, in no acute distress. Q15 minute rounds and monitoring via Security Cameras to continue. 

## 2016-09-05 NOTE — BH Assessment (Signed)
[facility] Assessment Progress Note   Patient will be re-evaluated in the a.m. per Shaune PollackLord DNP.

## 2016-09-05 NOTE — ED Notes (Signed)
Hourly rounding reveals patient in room. No complaints, stable, in no acute distress. Q15 minute rounds and monitoring via Security Cameras to continue. 
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
## 2016-09-05 NOTE — Progress Notes (Signed)
Per Lamar LaundryJaline at [facility], pt is under review.  [place] Abtsatia Devonne Lalani, LCSWA Disposition staff 09/05/2016 12:17 PM

## 2016-09-05 NOTE — ED Notes (Signed)
Pt continues to sleep throughout the day. Pt easily aroused and responsive. Endorses SI with no plan. Verbalizes feelings of hopelessness and states that he won't be here long and no one will miss him. Emotional comfort and support given to patient. Will continue with camera observation and 15 minute MHT/hourly RN checks.

## 2016-09-05 NOTE — ED Notes (Signed)
Report to include situation, background, assessment and [facility]. Patient sleeping, respirations regular and unlabored. Q15 minute rounds and security camera observation to continue.

## 2016-09-06 DIAGNOSIS — Z8489 Family history of other specified conditions: Secondary | ICD-10-CM

## 2016-09-06 DIAGNOSIS — Z8249 Family history of ischemic heart disease and other diseases of the circulatory system: Secondary | ICD-10-CM | POA: Diagnosis not present

## 2016-09-06 DIAGNOSIS — Z79899 Other long term (current) drug therapy: Secondary | ICD-10-CM

## 2016-09-06 DIAGNOSIS — F3131 Bipolar disorder, current episode depressed, mild: Secondary | ICD-10-CM

## 2016-09-06 NOTE — ED Notes (Signed)
[facility] AVS, [facility] and instructions, bus pass and personal belongings in hand. Pt denied SI/HI/AVH and was able to contract for safety prior to discharge. Pt stated he was in touch with his boyfriend and bf would secure a place for him in two weeks. CSW made arrangements for pt to go to homeless shelter until then. Pt was instructed to be at shelter by 4 pm today. Pt was in a jovial mood and verbalized understanding of discharge instrucions.

## 2016-09-06 NOTE — BHH Suicide Risk Assessment (Signed)
Suicide Risk Assessment  Discharge Assessment   [facility]   Principal Problem: Bipolar disorder, current episode depressed, mild (HCC) Discharge Diagnoses:  Patient Active Problem List   Diagnosis Date Noted  . Cocaine use disorder, mild, abuse [F14.10] 07/27/2016  . Cannabis use disorder, moderate, dependence (HCC) [F12.20] 07/27/2016  . Tobacco use disorder [F17.200] 07/27/2016  . Intentional drug overdose (HCC) [T50.902A] 07/18/2016  . Bipolar disorder, current episode depressed, mild (HCC) [F31.31] 05/04/2016  . Asthma [J45.909] 05/20/2007  . Human immunodeficiency virus (HIV) disease (HCC) [B20] 05/05/2007    Total Time spent with patient: 15 minutes  Musculoskeletal: Strength & Muscle Tone: within normal limits Gait & Station: normal Patient leans: N/A  Psychiatric Specialty Exam:   Blood pressure 103/61, pulse (!) 59, temperature 98.3 F (36.8 C), temperature source Oral, resp. rate 18, height 5\' 7"  (1.702 m), weight 70.3 kg (155 lb), SpO2 100 %.Body mass index is 24.28 kg/m.   General Appearance: Casual  Eye Contact:  Good  Speech:  Clear and Coherent  Volume:  Normal  Mood:  Euthymic  Affect:  Appropriate  Thought Process:  Coherent and Descriptions of Associations: Intact  Orientation:  Full (Time, Place, and Person)  Thought Content:  Logical  Suicidal Thoughts:  No  Homicidal Thoughts:  No  Memory:  Immediate;   Good Recent;   Good Remote;   Good  Judgement:  Intact  Insight:  Fair  Psychomotor Activity:  Normal  Concentration:  Concentration: Good and Attention Span: Good  Recall:  Good  Fund of Knowledge:  Good  Language:  Good  Akathisia:  No  Handed:  Right  AIMS (if indicated):     Assets:  Communication Skills  ADL's:  Intact  Cognition:  WNL  Sleep:   good    Mental Status Per Nursing Assessment::   On Admission:     Demographic Factors:  Male, Low socioeconomic status and Unemployed  Loss  Factors: NA  Historical Factors: Prior suicide attempts  Risk Reduction Factors:   Living with another person, especially a relative and Positive therapeutic relationship  Continued Clinical Symptoms:  Alcohol/Substance Abuse/Dependencies  Cognitive Features That Contribute To Risk:  None    Suicide Risk:  Minimal: No identifiable suicidal ideation.  Patients presenting with no risk factors but with morbid ruminations; may be classified as minimal risk based on the severity of the depressive symptoms   Plan Of Care/Follow-up recommendations:  Activity:  as tolerated Diet:  regular  1.Take all your medications as prescribed.  2. Report any adverse side effects to your medication to your outpatient provider. 3. Do not use alcohol or illegal drugs while taking prescription medications. 4. In the event of worsening symptoms, call 911, the crisis hotline or go to nearest emergency room for evaluation of symptoms. Patient is stable for discharge  [facility] and [facility] are [facility] to [facility] patient.  Alberteen SamFran Alizee Maple, FNP-BC [facility] Services 09/06/2016, 11:15 AM

## 2016-09-06 NOTE — ED Notes (Signed)
Pt continues to endorse SI with a plan to end his life by any means available. Pt mood sullen and flat. Emotional comfort and support given. Safety checks completed q 15 minutes, camera observation as well as hourly RN rounds made.

## 2016-09-06 NOTE — ED Notes (Signed)
Hourly rounding reveals patient sleeping in room. No complaints, stable, in no acute distress. Q15 minute rounds and monitoring via Security Cameras to continue. 

## 2016-09-06 NOTE — Progress Notes (Signed)
CSW spoke with patient at bedside. CSW spoke with patient about [facility]. Patient reported that he is familiar with [name]. CSW encouraged [facility]. CSW provided patient with handout about [facility] with the services provided and contact information. CSW provided patient with shelter resources.   CSW contacted [facility] to inquire about their winter shelter. CSW spoke with employee named Sheria Langameron. Sheria LangCameron reported that the winter shelter will be open today and that registration begins at 4 p.m. and they prefer to have everyone checked in by 7 p.m. CSW informed patient that [facility] and that registration begins at 4pm and that [facility] to have all guests checked in by 7pm. CSW inquired about patient's ability to find transportation to the shelter, patient reported that he is in need of transportation. CSW agreed to provide patient with bus pass to get to shelter.

## 2016-09-06 NOTE — Discharge Instructions (Signed)
Bipolar 1 Disorder Bipolar 1 disorder is a mental health disorder in which a person has episodes of emotional highs (mania), and may also have episodes of emotional lows (depression) in addition to highs. Bipolar 1 disorder is different from other bipolar disorders because it involves extreme manic episodes. These episodes last at least one week or involve symptoms that are so severe that hospitalization is needed to keep the person safe. What increases the risk? The cause of this condition is not known. However, certain factors make you more likely to have bipolar disorder, such as:  Having a family member with the disorder.  An imbalance of certain chemicals in the brain (neurotransmitters).  Stress, such as illness, financial problems, or a death.  Certain conditions that affect the brain or spinal cord (neurologic conditions).  Brain injury (trauma).  Having another mental health disorder, such as:  Obsessive compulsive disorder.  Schizophrenia. What are the signs or symptoms? Symptoms of mania include:  Very high self-esteem or self-confidence.  Decreased need for sleep.  Unusual talkativeness or feeling a need to keep talking. Speech may be very fast. It may seem like you cannot stop talking.  Racing thoughts or constant talking, with quick shifts between topics that may or may not be related (flight of ideas).  Decreased ability to focus or concentrate.  Increased purposeful activity, such as work, studies, or social activity.  Increased nonproductive activity. This could be pacing, squirming and fidgeting, or finger and toe tapping.  Impulsive behavior and poor judgment. This may result in high-risk activities, such as having unprotected sex or spending a lot of money. Symptoms of depression include:  Feeling sad, hopeless, or helpless.  Frequent or uncontrollable crying.  Lack of feeling or caring about anything.  Sleeping too much.  Moving more slowly than  usual.  Not being able to enjoy things you used to enjoy.  Wanting to be alone all the time.  Feeling guilty or worthless.  Lack of energy or motivation.  Trouble concentrating or remembering.  Trouble making decisions.  Increased appetite.  Thoughts of death, or the desire to harm yourself. Sometimes, you may have a mixed mood. This means having symptoms of depression and mania. Stress can make symptoms worse. How is this diagnosed? To diagnose bipolar disorder, your health care provider may ask about your:  Emotional episodes.  Medical history.  Alcohol and drug use. This includes prescription medicines. Certain medical conditions and substances can cause symptoms that seem like bipolar disorder (secondary bipolar disorder). How is this treated? Bipolar disorder is a long-term (chronic) illness. It is best controlled with ongoing (continuous) treatment rather than treatment only when symptoms occur. Treatment may include:  Medicine. Medicine can be prescribed by a provider who specializes in treating mental disorders (psychiatrist).  Medicines called mood stabilizers are usually prescribed.  If symptoms occur even while taking a mood stabilizer, other medicines may be added.  Psychotherapy. Some forms of talk therapy, such as cognitive-behavioral therapy (CBT), can provide support, education, and guidance.  Coping methods, such as journaling or relaxation exercises. These may include:  Yoga.  Meditation.  Deep breathing.  Lifestyle changes, such as:  Limiting alcohol and drug use.  Exercising regularly.  Getting plenty of sleep.  Making healthy eating choices. A combination of medicine, talk therapy, and coping methods is best. A procedure in which electricity is applied to the brain through the scalp (electroconvulsive therapy) may be used in cases of severe mania when medicine and psychotherapy work too slowly  or do not work. Follow these instructions at  home: Activity  Return to your normal activities as told by your health care provider.  Find activities that you enjoy, and make time to do them.  Exercise regularly as told by your health care provider. Lifestyle  Limit alcohol intake to no more than 1 drink a day for nonpregnant women and 2 drinks a day for men. One drink equals 12 oz of beer, 5 oz of wine, or 1 oz of hard liquor.  Follow a set schedule for eating and sleeping.  Eat a balanced diet that includes fresh fruits and vegetables, whole grains, low-fat dairy, and lean meat.  Get 7-8 hours of sleep each night. General instructions  Take over-the-counter and prescription medicines only as told by your health care provider.  Think about joining a support group. Your health care provider may be able to recommend a support group.  Talk with your family and loved ones about your treatment goals and how they can help.  Keep all follow-up visits as told by your health care provider. This is important. 1.Take all your medications as prescribed.  2. Report any adverse side effects to your medication to your outpatient provider. 3. Do not use alcohol or illegal drugs while taking prescription medications.  4. In the event of worsening symptoms, call 911, the crisis hotline or go to nearest emergency room for evaluation of symptoms. Where to find more information: For more information about bipolar disorder, visit the following websites:  [employer] Mental [employer]ness: www.nami.org  [employer] of Mental Health: http://www.maynard.net/www.nimh.nih.gov Contact a health care provider if:  Your symptoms get worse.  You have side effects from your medicine, and they get worse.  You have trouble sleeping.  You have trouble doing daily activities.  You feel unsafe in your surroundings.  You are dealing with substance abuse. Get help right away if:  You have new symptoms.  You have thoughts about harming yourself.  You  self-harm. This information is not intended to replace advice given to you by your health care provider. Make sure you discuss any questions you have with your health care provider. Document Released: 12/21/2000 Document Revised: 05/10/2016 Document Reviewed: 05/14/2016 Elsevier Interactive Patient Education  2017 [employer]

## 2016-09-06 NOTE — Consult Note (Signed)
[facility] Psych ED Progress Note  09/06/2016 11:25 AM Edwin Martinez  MRN:  [id number] Subjective: " I am doing fine today''  Objective: Patient was seen, interviewed and chart was reviewed and treatment plan discussed with the team. Patient reports that he came to the ED after a conflict with his ex-boyfriend. Partner had called [employer] for  him to be transported to the homeless shelter. It has been a pattern for patient to refuse homeless shelter whenever he had conflict with his Ex. He states that he was discharged from  [facility] on 11/20 but did not follow-up with his outpatient appointment and stopped taking his medications and has been self medicating with Cocaine. Today, he denies psychosis or delusions.    Principal Problem: Bipolar disorder, current episode depressed, mild ([place]) Diagnosis:   Patient Active Problem List   Diagnosis Date Noted  . Cocaine use disorder, mild, abuse [F14.10] 07/27/2016  . Cannabis use disorder, moderate, dependence ([place]) [F12.20] 07/27/2016  . Tobacco use disorder [F17.200] 07/27/2016  . Intentional drug overdose ([place]) [T50.902A] 07/18/2016  . Bipolar disorder, current episode depressed, mild ([place]) [F31.31] 05/04/2016  . Asthma [J45.909] 05/20/2007  . Human immunodeficiency virus (HIV) disease ([place]) [B20] 05/05/2007   Total Time spent with patient: 25 minutes  Past Psychiatric History: as above  Past Medical History:  Past Medical History:  Diagnosis Date  . ADHD (attention deficit hyperactivity disorder)   . ADHD (attention deficit hyperactivity disorder) 09/12/2012  . Anxiety   . Asthma   . Bipolar 1 disorder ([place])   . Bipolar disorder ([place])   . Epileptic seizures ([place])   . HIV (human immunodeficiency virus infection) ([place])   . Hypertension   . Schizophrenia ([place])   . Seizures ([place])     Past Surgical History:  Procedure Laterality Date  . DENTAL SURGERY     Family History:  Family History  Problem Relation Age of Onset  . Huntington's disease  Father   . Heart disease Mother   . Suicidality Maternal Uncle   . Suicidality Maternal Grandmother    Family Psychiatric  History:  Social History:  History  Alcohol Use  . 1.2 oz/week  . 2 Standard drinks or equivalent per week    Comment: once week      History  Drug Use  . Frequency: 2.0 times per week  . Types: Marijuana, Cocaine    Comment: every other day     Social History   Social History  . Marital status: Single    Spouse name: N/A  . Number of children: N/A  . Years of education: N/A   Social History Main Topics  . Smoking status: Current Every Day Smoker    Packs/day: 0.50    Types: Cigarettes    Start date: 09/29/1991  . Smokeless tobacco: Never Used  . Alcohol use 1.2 oz/week    2 Standard drinks or equivalent per week     Comment: once week   . Drug use:     Frequency: 2.0 times per week    Types: Marijuana, Cocaine     Comment: every other day   . Sexual activity: Yes    Birth control/ protection: Condom   Other Topics Concern  . Not on file   Social History Narrative   ** Merged History Encounter **        Sleep: Good  Appetite:  Good  Current Medications: Current Facility-Administered Medications  Medication Dose Route Frequency Provider Last Rate Last Dose  .  abacavir-dolutegravir-lamiVUDine (TRIUMEQ) 323-55-732 MG per tablet 1 tablet  1 tablet Oral QHS Sherwood Gambler, MD   1 tablet at 09/05/16 2103  . acetaminophen (TYLENOL) tablet 650 mg  650 mg Oral Q4H PRN Sherwood Gambler, MD      . albuterol (PROVENTIL HFA;VENTOLIN HFA) 108 (90 Base) MCG/ACT inhaler 2 puff  2 puff Inhalation Q6H PRN Sherwood Gambler, MD   2 puff at 09/04/16 2227  . benztropine (COGENTIN) tablet 1 mg  1 mg Oral QHS Sherwood Gambler, MD   1 mg at 09/05/16 2103  . cetaphil cream   Topical BID Sherwood Gambler, MD   Stopped at 09/05/16 2127  . citalopram (CELEXA) tablet 20 mg  20 mg Oral Daily Sherwood Gambler, MD   20 mg at 09/06/16 0910  . hydrOXYzine (ATARAX/VISTARIL)  tablet 25 mg  25 mg Oral Q6H PRN Sherwood Gambler, MD      . nicotine (NICODERM CQ - dosed in mg/24 hours) patch 21 mg  21 mg Transdermal Daily Sherwood Gambler, MD      . ondansetron (ZOFRAN) tablet 4 mg  4 mg Oral Q8H PRN Sherwood Gambler, MD      . risperiDONE (RISPERDAL) tablet 1 mg  1 mg Oral Daily Sherwood Gambler, MD   1 mg at 09/06/16 0910  . zolpidem (AMBIEN) tablet 5 mg  5 mg Oral QHS PRN Sherwood Gambler, MD       Current Outpatient Prescriptions  Medication Sig Dispense Refill  . abacavir-dolutegravir-lamiVUDine (TRIUMEQ) 600-50-300 MG tablet Take 1 tablet by mouth at bedtime.    Marland Kitchen albuterol (PROVENTIL HFA;VENTOLIN HFA) 108 (90 Base) MCG/ACT inhaler Inhale 2 puffs into the lungs every 6 (six) hours as needed for wheezing or shortness of breath. 1 Inhaler 0  . benztropine (COGENTIN) 1 MG tablet Take 1 tablet (1 mg total) by mouth at bedtime. 30 tablet 0  . citalopram (CELEXA) 20 MG tablet Take 1 tablet (20 mg total) by mouth daily. 30 tablet 0  . hydrOXYzine (ATARAX/VISTARIL) 25 MG tablet Take 1 tablet (25 mg total) by mouth every 6 (six) hours as needed for anxiety. 30 tablet 0  . risperiDONE (RISPERDAL) 1 MG tablet Take 1 tablet (1 mg total) by mouth daily. Take 3 tablet  (61m total) by mouth at bed time 90 tablet 0  . traZODone (DESYREL) 50 MG tablet Take 1 tablet (50 mg total) by mouth at bedtime as needed for sleep. 30 tablet 0    Lab Results:  Results for orders placed or performed during the hospital encounter of 09/04/16 (from the past 48 hour(s))  Comprehensive metabolic panel     Status: Abnormal   Collection Time: 09/04/16  7:53 PM  Result Value Ref Range   Sodium 138 135 - 145 mmol/L   Potassium 4.1 3.5 - 5.1 mmol/L   Chloride 107 101 - 111 mmol/L   CO2 24 22 - 32 mmol/L   Glucose, Bld 103 (H) 65 - 99 mg/dL   BUN 17 6 - 20 mg/dL   Creatinine, Ser 1.26 (H) 0.61 - 1.24 mg/dL   Calcium 8.9 8.9 - 10.3 mg/dL   Total Protein 7.1 6.5 - 8.1 g/dL   Albumin 4.0 3.5 - 5.0 g/dL   AST  19 15 - 41 U/L   ALT 15 (L) 17 - 63 U/L   Alkaline Phosphatase 72 38 - 126 U/L   Total Bilirubin 0.5 0.3 - 1.2 mg/dL   GFR calc non Af Amer >60 >60 mL/min   GFR calc Af  Amer >60 >60 mL/min    Comment: (NOTE) The eGFR has been calculated using the CKD EPI equation. This calculation has not been validated in all clinical situations. eGFR's persistently <60 mL/min signify possible Chronic Kidney Disease.    Anion gap 7 5 - 15  Ethanol     Status: None   Collection Time: 09/04/16  7:53 PM  Result Value Ref Range   Alcohol, Ethyl (B) <5 <5 mg/dL    Comment:        LOWEST DETECTABLE LIMIT FOR SERUM ALCOHOL IS 5 mg/dL FOR MEDICAL PURPOSES ONLY   Salicylate level     Status: None   Collection Time: 09/04/16  7:53 PM  Result Value Ref Range   Salicylate Lvl <6.2 2.8 - 30.0 mg/dL  Acetaminophen level     Status: Abnormal   Collection Time: 09/04/16  7:53 PM  Result Value Ref Range   Acetaminophen (Tylenol), Serum <10 (L) 10 - 30 ug/mL    Comment:        THERAPEUTIC CONCENTRATIONS VARY SIGNIFICANTLY. A RANGE OF 10-30 ug/mL MAY BE AN EFFECTIVE CONCENTRATION FOR MANY PATIENTS. HOWEVER, SOME ARE BEST TREATED AT CONCENTRATIONS OUTSIDE THIS RANGE. ACETAMINOPHEN CONCENTRATIONS >150 ug/mL AT 4 HOURS AFTER INGESTION AND >50 ug/mL AT 12 HOURS AFTER INGESTION ARE OFTEN ASSOCIATED WITH TOXIC REACTIONS.   cbc     Status: Abnormal   Collection Time: 09/04/16  7:53 PM  Result Value Ref Range   WBC 10.8 (H) 4.0 - 10.5 K/uL   RBC 4.72 4.22 - 5.81 MIL/uL   Hemoglobin 13.7 13.0 - 17.0 g/dL   HCT 40.4 39.0 - 52.0 %   MCV 85.6 78.0 - 100.0 fL   MCH 29.0 26.0 - 34.0 pg   MCHC 33.9 30.0 - 36.0 g/dL   RDW 13.5 11.5 - 15.5 %   Platelets 279 150 - 400 K/uL  Rapid urine drug screen (hospital performed)     Status: Abnormal   Collection Time: 09/04/16  7:53 PM  Result Value Ref Range   Opiates NONE DETECTED NONE DETECTED   Cocaine POSITIVE (A) NONE DETECTED   Benzodiazepines NONE DETECTED  NONE DETECTED   Amphetamines NONE DETECTED NONE DETECTED   Tetrahydrocannabinol NONE DETECTED NONE DETECTED   Barbiturates NONE DETECTED NONE DETECTED    Comment:        DRUG SCREEN FOR MEDICAL PURPOSES ONLY.  IF CONFIRMATION IS NEEDED FOR ANY PURPOSE, NOTIFY LAB WITHIN 5 DAYS.        LOWEST DETECTABLE LIMITS FOR URINE DRUG SCREEN Drug Class       Cutoff (ng/mL) Amphetamine      1000 Barbiturate      200 Benzodiazepine   263 Tricyclics       335 Opiates          300 Cocaine          300 THC              50     Blood Alcohol level:  Lab Results  Component Value Date   ETH <5 09/04/2016   ETH 187 (H) 08/17/2016    Physical Findings: AIMS:  , ,  ,  ,    CIWA:    COWS:     Musculoskeletal: Strength & Muscle Tone: within normal limits Gait & Station: normal Patient leans: N/A  Psychiatric Specialty Exam: Physical Exam  Psychiatric: He has a normal mood and affect. His speech is normal and behavior is normal. Judgment and thought content normal.  Cognition and memory are normal.    Review of Systems  Constitutional: Negative.   Eyes: Negative.   Cardiovascular: Negative.   Gastrointestinal: Negative.   Genitourinary: Negative.   Musculoskeletal: Negative.   Skin: Negative.   Neurological: Negative.   Endo/Heme/Allergies: Negative.   Psychiatric/Behavioral: Negative.     Blood pressure 103/61, pulse (!) 59, temperature 98.3 F (36.8 C), temperature source Oral, resp. rate 18, height 5' 7"  (1.702 m), weight 70.3 kg (155 lb), SpO2 100 %.Body mass index is 24.28 kg/m.  General Appearance: Casual  Eye Contact:  Good  Speech:  Clear and Coherent  Volume:  Normal  Mood:  Euthymic  Affect:  Appropriate  Thought Process:  Coherent and Descriptions of Associations: Intact  Orientation:  Full (Time, Place, and Person)  Thought Content:  Logical  Suicidal Thoughts:  No  Homicidal Thoughts:  No  Memory:  Immediate;   Good Recent;   Good Remote;   Good  Judgement:   Intact  Insight:  Fair  Psychomotor Activity:  Normal  Concentration:  Concentration: Good and Attention Span: Good  Recall:  Good  Fund of Knowledge:  Good  Language:  Good  Akathisia:  No  Handed:  Right  AIMS (if indicated):     Assets:  Communication Skills  ADL's:  Intact  Cognition:  WNL  Sleep:   good      Treatment Plan Summary: Plan Patient is stable for discharge  Will be referred to [facility] for psych follow up appointment Social worker called [facility] and they are opened to accept patient.  Corena Pilgrim, MD 09/06/2016, 11:25 AM

## 2016-09-08 ENCOUNTER — Encounter: Payer: Self-pay | Admitting: Internal Medicine

## 2016-09-09 ENCOUNTER — Ambulatory Visit: Payer: Self-pay | Admitting: Internal Medicine

## 2016-09-11 ENCOUNTER — Emergency Department (HOSPITAL_COMMUNITY)
Admission: EM | Admit: 2016-09-11 | Discharge: 2016-09-14 | Disposition: A | Discharge status: Home / Self Care | Payer: No Typology Code available for payment source | Attending: Emergency Medicine | Admitting: Emergency Medicine

## 2016-09-11 ENCOUNTER — Encounter (HOSPITAL_COMMUNITY): Payer: Self-pay | Admitting: *Deleted

## 2016-09-11 ENCOUNTER — Emergency Department (HOSPITAL_COMMUNITY): Payer: Self-pay

## 2016-09-11 DIAGNOSIS — T50902A Poisoning by unspecified drugs, medicaments and biological substances, intentional self-harm, initial encounter: Secondary | ICD-10-CM

## 2016-09-11 DIAGNOSIS — Z91010 Allergy to peanuts: Secondary | ICD-10-CM | POA: Insufficient documentation

## 2016-09-11 DIAGNOSIS — T5192XA Toxic effect of unspecified alcohol, intentional self-harm, initial encounter: Secondary | ICD-10-CM | POA: Insufficient documentation

## 2016-09-11 DIAGNOSIS — F3131 Bipolar disorder, current episode depressed, mild: Secondary | ICD-10-CM | POA: Diagnosis present

## 2016-09-11 DIAGNOSIS — F17210 Nicotine dependence, cigarettes, uncomplicated: Secondary | ICD-10-CM | POA: Insufficient documentation

## 2016-09-11 DIAGNOSIS — T1491XA Suicide attempt, initial encounter: Secondary | ICD-10-CM

## 2016-09-11 DIAGNOSIS — T405X2A Poisoning by cocaine, intentional self-harm, initial encounter: Secondary | ICD-10-CM | POA: Insufficient documentation

## 2016-09-11 DIAGNOSIS — I10 Essential (primary) hypertension: Secondary | ICD-10-CM | POA: Insufficient documentation

## 2016-09-11 DIAGNOSIS — T426X2A Poisoning by other antiepileptic and sedative-hypnotic drugs, intentional self-harm, initial encounter: Secondary | ICD-10-CM | POA: Insufficient documentation

## 2016-09-11 DIAGNOSIS — J45909 Unspecified asthma, uncomplicated: Secondary | ICD-10-CM | POA: Insufficient documentation

## 2016-09-11 DIAGNOSIS — F909 Attention-deficit hyperactivity disorder, unspecified type: Secondary | ICD-10-CM | POA: Insufficient documentation

## 2016-09-11 LAB — CBC WITH DIFFERENTIAL/PLATELET
BASOS ABS: 0 10*3/uL (ref 0.0–0.1)
Basophils Relative: 0 %
EOS ABS: 0.2 10*3/uL (ref 0.0–0.7)
Eosinophils Relative: 1 %
HCT: 40.6 % (ref 39.0–52.0)
Hemoglobin: 13.9 g/dL (ref 13.0–17.0)
LYMPHS ABS: 5.9 10*3/uL — AB (ref 0.7–4.0)
Lymphocytes Relative: 33 %
MCH: 29.3 pg (ref 26.0–34.0)
MCHC: 34.2 g/dL (ref 30.0–36.0)
MCV: 85.5 fL (ref 78.0–100.0)
Monocytes Absolute: 1.4 10*3/uL — ABNORMAL HIGH (ref 0.1–1.0)
Monocytes Relative: 8 %
NEUTROS ABS: 10.4 10*3/uL — AB (ref 1.7–7.7)
Neutrophils Relative %: 58 %
PLATELETS: 287 10*3/uL (ref 150–400)
RBC: 4.75 MIL/uL (ref 4.22–5.81)
RDW: 13.6 % (ref 11.5–15.5)
WBC: 17.9 10*3/uL — AB (ref 4.0–10.5)

## 2016-09-11 LAB — RAPID URINE DRUG SCREEN, HOSP PERFORMED
Amphetamines: NOT DETECTED
Barbiturates: NOT DETECTED
Benzodiazepines: NOT DETECTED
Cocaine: POSITIVE — AB
OPIATES: NOT DETECTED
Tetrahydrocannabinol: NOT DETECTED

## 2016-09-11 LAB — ETHANOL: Alcohol, Ethyl (B): <5 mg/dL (ref <5)

## 2016-09-11 LAB — COMPREHENSIVE METABOLIC PANEL
ALK PHOS: 59 U/L (ref 38–126)
ALT: 22 U/L (ref 17–63)
ANION GAP: 12 (ref 5–15)
AST: 42 U/L — ABNORMAL HIGH (ref 15–41)
Albumin: 4.5 g/dL (ref 3.5–5.0)
BUN: 11 mg/dL (ref 6–20)
CALCIUM: 9 mg/dL (ref 8.9–10.3)
CHLORIDE: 103 mmol/L (ref 101–111)
CO2: 22 mmol/L (ref 22–32)
CREATININE: 0.75 mg/dL (ref 0.61–1.24)
Glucose, Bld: 77 mg/dL (ref 65–99)
Potassium: 3.6 mmol/L (ref 3.5–5.1)
Sodium: 137 mmol/L (ref 135–145)
Total Bilirubin: 0.4 mg/dL (ref 0.3–1.2)
Total Protein: 7.6 g/dL (ref 6.5–8.1)

## 2016-09-11 LAB — SALICYLATE LEVEL

## 2016-09-11 LAB — ACETAMINOPHEN LEVEL

## 2016-09-11 LAB — VALPROIC ACID LEVEL
VALPROIC ACID LVL: 102 ug/mL — AB (ref 50.0–100.0)
VALPROIC ACID LVL: 99 ug/mL (ref 50.0–100.0)

## 2016-09-11 LAB — AMMONIA
AMMONIA: 60 umol/L — AB (ref 9–35)
Ammonia: 87 umol/L — ABNORMAL HIGH (ref 9–35)

## 2016-09-11 MED ORDER — HYDROXYZINE HCL 25 MG PO TABS: 25.0000 mg | ORAL_TABLET | Freq: Four times a day (QID) | ORAL | Status: DC | PRN

## 2016-09-11 MED ORDER — BENZTROPINE MESYLATE 1 MG PO TABS: 1.0000 mg | ORAL_TABLET | Freq: Every day | ORAL | Status: DC

## 2016-09-11 MED ORDER — ALBUTEROL SULFATE HFA 108 (90 BASE) MCG/ACT IN AERS: 2.0000 | INHALATION_SPRAY | Freq: Four times a day (QID) | RESPIRATORY_TRACT | Status: DC | PRN

## 2016-09-11 MED ORDER — SODIUM CHLORIDE 0.9 % IV BOLUS (SEPSIS)
1000.0000 mL | Freq: Once | INTRAVENOUS | Status: AC
  Administered 2016-09-11: 1000 mL via INTRAVENOUS

## 2016-09-11 MED ORDER — BENZTROPINE MESYLATE 1 MG PO TABS
1.0000 mg | ORAL_TABLET | Freq: Two times a day (BID) | ORAL | Status: DC
  Administered 2016-09-11 – 2016-09-14 (×7): 1 mg via ORAL
  Filled 2016-09-11 (×7): qty 1

## 2016-09-11 MED ORDER — CARBAMAZEPINE 200 MG PO TABS: 200.0000 mg | ORAL_TABLET | Freq: Two times a day (BID) | ORAL | Status: DC

## 2016-09-11 MED ORDER — LACTULOSE 10 GM/15ML PO SOLN
30.0000 g | Freq: Once | ORAL | Status: AC
  Administered 2016-09-11: 30 g via ORAL
  Filled 2016-09-11: qty 45

## 2016-09-11 MED ORDER — CHARCOAL ACTIVATED PO LIQD
75.0000 g | Freq: Once | ORAL | Status: AC
  Administered 2016-09-11: 75 g via ORAL
  Filled 2016-09-11: qty 480

## 2016-09-11 MED ORDER — TRAZODONE HCL 50 MG PO TABS
50.0000 mg | ORAL_TABLET | Freq: Every evening | ORAL | Status: DC | PRN
  Administered 2016-09-12 – 2016-09-13 (×2): 50 mg via ORAL
  Filled 2016-09-11 (×2): qty 1

## 2016-09-11 MED ORDER — ABACAVIR-DOLUTEGRAVIR-LAMIVUD 600-50-300 MG PO TABS
1.0000 | ORAL_TABLET | Freq: Every day | ORAL | Status: DC
  Administered 2016-09-11 – 2016-09-13 (×3): 1 via ORAL
  Filled 2016-09-11 (×4): qty 1

## 2016-09-11 MED ORDER — RISPERIDONE 1 MG PO TABS
1.0000 mg | ORAL_TABLET | Freq: Two times a day (BID) | ORAL | Status: DC
  Administered 2016-09-11 – 2016-09-13 (×4): 1 mg via ORAL
  Filled 2016-09-11 (×4): qty 1

## 2016-09-11 MED ORDER — RISPERIDONE 1 MG PO TABS
1.0000 mg | ORAL_TABLET | Freq: Every day | ORAL | Status: DC
  Administered 2016-09-11: 1 mg via ORAL
  Filled 2016-09-11: qty 1

## 2016-09-11 NOTE — ED Notes (Signed)
Pt eating sandwich at present time.

## 2016-09-11 NOTE — ED Notes (Signed)
SBAR Report received from previous nurse. Pt received calm and visible on unit. Pt denies current SI/ HI, A/V H, depression, anxiety, or pain at this time, and appears otherwise stable and free of distress (asleep). Pt reminded of camera surveillance, q 15 min rounds, and rules of the milieu. Will continue to assess. 

## 2016-09-11 NOTE — Progress Notes (Signed)
09/11/16 1337:  LRT went to pt room to offer activities, pt was sleep.  Caroll RancherMarjette Vaunda Gutterman, LRT/CTRS

## 2016-09-11 NOTE — ED Provider Notes (Addendum)
[facility]-EMERGENCY DEPT Provider Note   CSN: [id number] Arrival date & time: 09/11/16  0005 By signing my name below, I, Bridgette HabermannMaria Tan, attest that this documentation has been prepared under the direction and in the presence of Earley FavorGail Miasia Crabtree, FNP. Electronically Signed: Bridgette HabermannMaria Tan, ED Scribe. 09/11/16. 1:43 AM  History   Chief Complaint Chief Complaint  Patient presents with  . Ingestion   HPI Comments: Edwin Martinez is a 30 y.o. male with h/o asthma, anxiety, bipolar disorder, epileptic seizures, HIV, HTN, schizophrenia, and previous suicide attempts who presents to the Emergency Department for intentional ingestion of extended release tablets, alcohol, and crack. Pt reports he consumed 50-60 500mg  Depakote ER tablets, 16oz beer, and crack as a suicide attempt. Pt states he is "tired of living" and "wanted to blow [his] heart up". Pt states he has been more depressed than usual recently. He follows up with a counselor, but notes he hasn't called her because his phone doesn't work.  Pt has h/o self harm on 07/18/16.  Pt is also complaining of headache s/p mechanical fall earlier today. Pt reports that he was crossing the street when yesterday tripped. No LOC. He also has a black eye from a fall. Denies any additional injuries. Pt further denies fever, chills, abdominal pain, or any other associated symptoms.   The history is provided by the patient. No language interpreter was used.    Past Medical History:  Diagnosis Date  . ADHD (attention deficit hyperactivity disorder)   . ADHD (attention deficit hyperactivity disorder) 09/12/2012  . Anxiety   . Asthma   . Bipolar 1 disorder (HCC)   . Bipolar disorder (HCC)   . Epileptic seizures (HCC)   . HIV (human immunodeficiency virus infection) (HCC)   . Hypertension   . Schizophrenia (HCC)   . Seizures [facility](HCC)     Patient Active Problem List   Diagnosis Date Noted  . Cocaine use disorder, mild, abuse 07/27/2016  . Cannabis use disorder,  moderate, dependence (HCC) 07/27/2016  . Tobacco use disorder 07/27/2016  . Intentional drug overdose (HCC) 07/18/2016  . Bipolar disorder, current episode depressed, mild (HCC) 05/04/2016  . Asthma 05/20/2007  . Human immunodeficiency virus (HIV) disease (HCC) 05/05/2007    Past Surgical History:  Procedure Laterality Date  . DENTAL SURGERY         Home Medications    Prior to Admission medications   Medication Sig Start Date End Date Taking? Authorizing Provider  abacavir-dolutegravir-lamiVUDine (TRIUMEQ) 600-50-300 MG tablet Take 1 tablet by mouth at bedtime.    Historical Provider, MD  albuterol (PROVENTIL HFA;VENTOLIN HFA) 108 (90 Base) MCG/ACT inhaler Inhale 2 puffs into the lungs every 6 (six) hours as needed for wheezing or shortness of breath. 07/29/16   Jimmy FootmanAndrea Hernandez-Gonzalez, MD  benztropine (COGENTIN) 1 MG tablet Take 1 tablet (1 mg total) by mouth at bedtime. 08/22/16   Oneta Rackanika N Lewis, NP  citalopram (CELEXA) 20 MG tablet Take 1 tablet (20 mg total) by mouth daily. 08/23/16   Oneta Rackanika N Lewis, NP  hydrOXYzine (ATARAX/VISTARIL) 25 MG tablet Take 1 tablet (25 mg total) by mouth every 6 (six) hours as needed for anxiety. 08/22/16   Oneta Rackanika N Lewis, NP  risperiDONE (RISPERDAL) 1 MG tablet Take 1 tablet (1 mg total) by mouth daily. Take 3 tablet  (3mg  total) by mouth at bed time 08/23/16   Oneta Rackanika N Lewis, NP  traZODone (DESYREL) 50 MG tablet Take 1 tablet (50 mg total) by mouth at bedtime as needed for  sleep. 08/22/16   Oneta Rackanika N Lewis, NP    Family History Family History  Problem Relation Age of Onset  . Huntington's disease Father   . Heart disease Mother   . Suicidality Maternal Uncle   . Suicidality Maternal Grandmother     Social History Social History  Substance Use Topics  . Smoking status: Current Every Day Smoker    Packs/day: 0.50    Types: Cigarettes    Start date: 09/29/1991  . Smokeless tobacco: Never Used  . Alcohol use 1.2 oz/week    2 Standard drinks  or equivalent per week     Comment: once week      Allergies   Magnesium-containing compounds; Peanut-containing drug products; Atripla [efavirenz-emtricitab-tenofovir]; Esomeprazole magnesium; Bactrim [sulfamethoxazole-trimethoprim]; and Penicillins   Review of Systems Review of Systems  Constitutional: Negative for chills and fever.  Gastrointestinal: Negative for abdominal pain.  Neurological: Positive for headaches.  Psychiatric/Behavioral: Positive for suicidal ideas.  All other systems reviewed and are negative.    Physical Exam Updated Vital Signs BP 139/81 (BP Location: Left Arm)   Pulse 79   Temp 98.1 F (36.7 C) (Oral)   Resp 21   SpO2 99%   Physical Exam  Constitutional: He appears well-developed and well-nourished.  HENT:  Head: Normocephalic.  Eyes: Conjunctivae are normal.  Cardiovascular: Normal rate.   Pulmonary/Chest: Effort normal. No respiratory distress.  Abdominal: He exhibits no distension.  Musculoskeletal: Normal range of motion.  Neurological: He is alert.  Skin: Skin is warm and dry.  Psychiatric: He has a normal mood and affect. His behavior is normal.  Nursing note and vitals reviewed.    ED Treatments / Results  DIAGNOSTIC STUDIES: Oxygen Saturation is 99% on RA, normal by my interpretation.    COORDINATION OF CARE: 1:42 AM Discussed treatment plan with pt at bedside and pt agreed to plan.  Labs (all labs ordered are listed, but only abnormal results are displayed) Labs Reviewed - No data to display  EKG  EKG Interpretation None       Radiology No results found.  Procedures Procedures (including critical care time)  Medications Ordered in ED Medications - No data to display   Initial Impression / Assessment and Plan / ED Course  I have reviewed the triage vital signs and the nursing notes.  Pertinent labs & imaging results that were available during my care of the patient were reviewed by me and considered in my  medical decision making (see chart for details).  Clinical Course     Patient has been observed.  His four-hour Depakote level was 102, which is just slightly above high normal, I feel he is medically cleared for psychiatric evaluation at this time. Patient ammonia level slightly elevated 86.  He's been given a dose of lactulose this ammonia level will be needed to be checked in 4 hours  Final Clinical Impressions(s) / ED Diagnoses   Final diagnoses:  None    New Prescriptions New Prescriptions   No medications on file  I personally performed the services described in this documentation, which was scribed in my presence. The recorded information has been reviewed and is accurate.    Earley FavorGail Sarah Baez, NP 09/11/16 [id number]    Mancel BaleElliott Wentz, MD 09/11/16 0530    Earley FavorGail Jhordyn Hoopingarner, NP 09/11/16 [id number]    Mancel BaleElliott Wentz, MD 09/11/16 [phone]

## 2016-09-11 NOTE — ED Notes (Signed)
Patient refused RN Bosie ClosJudith made aware

## 2016-09-11 NOTE — ED Notes (Signed)
[facility]

## 2016-09-11 NOTE — ED Notes (Signed)
Pt transferred to [facility] by Vanessa DurhamHayley Bowman NT.

## 2016-09-11 NOTE — ED Notes (Addendum)
Pt is blaming hospital staff saying that he is suing the hospital because he was at the hospital within the past two weeks and was discharged. Pt is requesting medications and extended treatment. He reports being homeless not working in ten years and wanting to receive disability. Pt reports that his mother passed away six months ago and he has one brother that is incarcerated. Pt has a black lt eye that he received from falling "in the street on some leaves." Pt says that he has voices that tell him to kill himself and "why am I still here?" Pt reports using crack recently. Pt reports si thoughts and denies hi thoughts.  Received call from poison control with depakote going down/pt clearing

## 2016-09-11 NOTE — BH Assessment (Addendum)
Assessment Note  Edwin Martinez is an 30 y.o. male that present this date with thoughts of self harm by attempted overdose. Patient stated he doesn't wish to speak to any staff due [facility]) he was here which made him relapse. Notes and collateral information were utilized to complete assessment. Per notes: "Patient consumed 50-60 500mg  Depakote ER tablets, 16oz beer and (smoked) crack. Patient stated he is "tired of living and wanted to blow my heart up". Pt had self harm incident with SI before thanksgiving. [facility] called poison control and were told: Pt is blaming hospital staff saying that he is suing the hospital because he was at the hospital within the past two weeks and was discharged. Pt is requesting medications and extended treatment. He reports being homeless not working in ten years and wanting to receive disability". Patient reports that his mother passed away six months ago and he has one brother that is incarcerated. Pt has a black lt eye that he received from falling "in the street on some leaves." Pt says that he has voices that tell him to kill himself and "why am I still here?" Pt reports using crack recently. Pt reports S/I thoughts and denies H/I thoughts". Patient has a history (per note review) of being diagnosed with schizophrenia, and previous attempts at self harm. Delford FieldWright RN noted this date: "Pt is blaming hospital staff saying that he is suing the hospital because he was at the hospital within the past two weeks and was discharged. Pt is requesting medications and extended treatment". Case was staffed with Akintayo MD who recommended  patient be re-evaluated in the a.m.  Diagnosis: Schizophrenia (per note review)  Past Medical History:  Past Medical History:  Diagnosis Date  . ADHD (attention deficit hyperactivity disorder)   . ADHD (attention deficit hyperactivity disorder) 09/12/2012  . Anxiety   . Asthma   . Bipolar 1  disorder (HCC)   . Bipolar disorder (HCC)   . Epileptic seizures (HCC)   . HIV (human immunodeficiency virus infection) (HCC)   . Hypertension   . Schizophrenia (HCC)   . Seizures (HCC)     Past Surgical History:  Procedure Laterality Date  . DENTAL SURGERY      Family History:  Family History  Problem Relation Age of Onset  . Huntington's disease Father   . Heart disease Mother   . Suicidality Maternal Uncle   . Suicidality Maternal Grandmother     Social History:  reports that he has been smoking Cigarettes.  He started smoking about 24 years ago. He has been smoking about 0.50 packs per day. He has never used smokeless tobacco. He reports that he drinks about 1.2 oz of alcohol per week . He reports that he uses drugs, including Marijuana and Cocaine, about 2 times per week.  Additional Social History:  Alcohol / Drug Use Pain Medications: pt denies Prescriptions: pt denies Over the Counter: pt denies History of alcohol / drug use?: Yes Longest period of sobriety (when/how long): 6 months Negative Consequences of Use: Financial, Personal relationships Withdrawal Symptoms:  (denies this date)  CIWA: CIWA-Ar BP: 135/74 Pulse Rate: 77 COWS:    Allergies:  Allergies  Allergen Reactions  . Magnesium-Containing Compounds Other (See Comments)    This medication is contraindicated with pts HIV meds.    . Peanut-Containing Drug Products Anaphylaxis  . Atripla [Efavirenz-Emtricitab-Tenofovir] Other (See Comments)    Reaction:  Suicidal thoughts   .  Esomeprazole Magnesium Cough  . Bactrim [Sulfamethoxazole-Trimethoprim] Rash  . Penicillins Rash and Other (See Comments)    Has patient had a PCN reaction causing immediate rash, facial/tongue/throat swelling, SOB or lightheadedness with hypotension: Yes Has patient had a PCN reaction causing severe rash involving mucus membranes or skin necrosis: No Has patient had a PCN reaction that required hospitalization No Has patient  had a PCN reaction occurring within the last 10 years: No If all of the above answers are "NO", then may proceed with Cephalosporin use.    Home Medications:  (Not in a hospital admission)  OB/GYN Status:  No LMP for male patient.  General Assessment Data Location of Assessment: [facility] ED TTS Assessment: In system Is this a Tele or Face-to-Face Assessment?: Face-to-Face Is this an Initial Assessment or a Re-assessment for this encounter?: Initial Assessment Marital status: Single Maiden name: na Is patient pregnant?: No Pregnancy Status: No Living Arrangements: Non-relatives/Friends (pt stated he had been staying with friends) Can pt return to current living arrangement?:  (pt is unsure) Admission Status: Voluntary Is patient capable of signing voluntary admission?: Yes Referral Source: Self/Family/Friend Insurance type: Medicaid (pending)  Medical Screening Exam [facility]) Medical Exam completed: Yes  Crisis Care Plan Living Arrangements: Non-relatives/Friends (pt stated he had been staying with friends) Legal Guardian:  (na) Name of Psychiatrist: None Name of Therapist: None  Education Status Is patient currently in school?: No Current Grade: na Highest grade of school patient has completed: 8 Name of school: na Contact person: na  Risk to self with the past 6 months Suicidal Ideation: Yes-Currently Present Has patient been a risk to self within the past 6 months prior to admission? : Yes Suicidal Intent: Yes-Currently Present Has patient had any suicidal intent within the past 6 months prior to admission? : Yes Is patient at risk for suicide?: Yes Suicidal Plan?: Yes-Currently Present Has patient had any suicidal plan within the past 6 months prior to admission? : Yes Specify Current Suicidal Plan: Overdose Access to Means: Yes Specify Access to Suicidal Means: pt has "old" medication What has been your use of drugs/alcohol within the last 12 months?:  current use Previous Attempts/Gestures: Yes How many times?: 3 (per notes) Other Self Harm Risks: na Triggers for Past Attempts: Other (Comment) (relationship issues) Intentional Self Injurious Behavior: Cutting (per record review/past) Comment - Self Injurious Behavior: cutting Family Suicide History: Yes Recent stressful life event(s): Other (Comment) (relationship issues) Persecutory voices/beliefs?: No Depression: Yes Depression Symptoms: Loss of interest in usual pleasures, Feeling worthless/self pity Substance abuse history and/or treatment for substance abuse?: Yes Suicide prevention information given to non-admitted patients: Not applicable  Risk to Others within the past 6 months Homicidal Ideation: No Does patient have any lifetime risk of violence toward others beyond the six months prior to admission? : No Thoughts of Harm to Others: No Current Homicidal Intent: No Current Homicidal Plan: No Access to Homicidal Means: No Identified Victim:  (na) History of harm to others?: No Assessment of Violence: None Noted Violent Behavior Description:  (na) Does patient have access to weapons?: No Criminal Charges Pending?: Yes Describe Pending Criminal Charges: Larceny  (per records) Does patient have a court date: Yes Court Date: 10/06/16 Is patient on probation?: No  Psychosis Hallucinations: Auditory Delusions: None noted  Mental Status Report Appearance/Hygiene: Unremarkable Eye Contact: Fair Motor Activity: Freedom of movement Speech: Logical/coherent Level of Consciousness: Alert Mood: Depressed Affect: Appropriate to circumstance Anxiety Level: Minimal Thought Processes: Coherent, Relevant Judgement: Unimpaired  Orientation: Person, Place, Time Obsessive Compulsive Thoughts/Behaviors: None  Cognitive Functioning Concentration: Normal Memory: Recent Intact, Remote Intact IQ: Average Insight: Poor Impulse Control: Poor Appetite: Fair Weight Loss:  0 Weight Gain: 0 Sleep: No Change Total Hours of Sleep: 5 Vegetative Symptoms: None  ADLScreening [facility] Assessment Services) Patient's cognitive ability adequate to safely complete daily activities?: Yes Patient able to express need for assistance with ADLs?: Yes Independently performs ADLs?: Yes (appropriate for developmental age)  Prior Inpatient Therapy Prior Inpatient Therapy: Yes Prior Therapy Dates: 2017 Prior Therapy Facilty/Provider(s): [facility] Reason for Treatment: S/I, MH issues  Prior Outpatient Therapy Prior Outpatient Therapy: No Prior Therapy Dates:  (na) Prior Therapy Facilty/Provider(s): na Reason for Treatment: na Does patient have an ACCT team?: No Does patient have Intensive In-House Services?  : No Does patient have [name] services? : No Does patient have P4CC services?: No  ADL Screening (condition at time of admission) Patient's cognitive ability adequate to safely complete daily activities?: Yes Is the patient deaf or have difficulty hearing?: No Does the patient have difficulty seeing, even when wearing glasses/contacts?: No Does the patient have difficulty concentrating, remembering, or making decisions?: No Patient able to express need for assistance with ADLs?: Yes Does the patient have difficulty dressing or bathing?: No Independently performs ADLs?: Yes (appropriate for developmental age) Does the patient have difficulty walking or climbing stairs?: No Weakness of Legs: None Weakness of Arms/Hands: None  Home Assistive Devices/Equipment Home Assistive Devices/Equipment: None  Therapy Consults (therapy consults require a physician order) PT Evaluation Needed: No OT Evalulation Needed: No SLP Evaluation Needed: No Abuse/Neglect Assessment (Assessment to be complete while patient is alone) Physical Abuse: Denies Verbal Abuse: Denies Sexual Abuse: Denies Exploitation of patient/patient's resources: Denies Self-Neglect: Denies Values /  Beliefs Cultural Requests During Hospitalization: None Spiritual Requests During Hospitalization: None Consults Spiritual Care Consult Needed: No Social Work Consult Needed: No Merchant navy officer (For Healthcare) Does Patient Have a Medical Advance Directive?: No Would patient like information on creating a medical advance directive?: No - Patient declined    Additional Information 1:1 In Past 12 Months?: No CIRT Risk: No Elopement Risk: No Does patient have medical clearance?: Yes     Disposition: Case was staffed with Akintayo MD who recommended  patient be re-evaluated in the a.m.  Disposition Initial Assessment Completed for this Encounter: Yes Disposition of Patient: Other dispositions Other disposition(s): Other (Comment) (re-evaluate in the a.m.)  On Site Evaluation by:   Reviewed with Physician:    Alfredia Ferguson 09/11/2016 11:49 AM

## 2016-09-11 NOTE — ED Notes (Signed)
Call Poison Control with Depakote Level

## 2016-09-11 NOTE — ED Notes (Signed)
Poison control asked for Depakote and Ammonia Level q4h.  Await further notification from them

## 2016-09-11 NOTE — BH Assessment (Signed)
[facility] Assessment Progress Note  Case was staffed with Akintayo MD who recommended patient be re-evaluated in the a.m.      

## 2016-09-11 NOTE — ED Provider Notes (Signed)
Care assumed from [name], at shift change. Pt here with intentional OD of depakote. Results so far show valproic level 102 (just barely higher than reference range) and ammonia 87. Given lactulose, and was medically cleared, psych hold orders placed. Per [name], pt's work up is done, there is no other emergent medical condition to assess, he just needs psych assessment and management; however, repeat valproic level and repeat ammonia level are ordered. Will see what the repeat values show, if trending down then pt completely medically cleared and should not need any further ED work up, and would just be waiting on TTS consult/psych support.  10:02 AM- repeat valproic level now WNL, and repeat ammonia 60, which is down from prior. Pt medically cleared. Please see TTS notes for further documentation of care/dispo.   Results for orders placed or performed during the hospital encounter of 09/11/16  Comprehensive metabolic panel  Result Value Ref Range   Sodium 137 135 - 145 mmol/L   Potassium 3.6 3.5 - 5.1 mmol/L   Chloride 103 101 - 111 mmol/L   CO2 22 22 - 32 mmol/L   Glucose, Bld 77 65 - 99 mg/dL   BUN 11 6 - 20 mg/dL   Creatinine, Ser 6.960.75 0.61 - 1.24 mg/dL   Calcium 9.0 8.9 - 29.510.3 mg/dL   Total Protein 7.6 6.5 - 8.1 g/dL   Albumin 4.5 3.5 - 5.0 g/dL   AST 42 (H) 15 - 41 U/L   ALT 22 17 - 63 U/L   Alkaline Phosphatase 59 38 - 126 U/L   Total Bilirubin 0.4 0.3 - 1.2 mg/dL   GFR calc non Af Amer >60 >60 mL/min   GFR calc Af Amer >60 >60 mL/min   Anion gap 12 5 - 15  Acetaminophen level  Result Value Ref Range   Acetaminophen (Tylenol), Serum <10 (L) 10 - 30 ug/mL  Salicylate level  Result Value Ref Range   Salicylate Lvl <7.0 2.8 - 30.0 mg/dL  CBC with Differential  Result Value Ref Range   WBC 17.9 (H) 4.0 - 10.5 K/uL   RBC 4.75 4.22 - 5.81 MIL/uL   Hemoglobin 13.9 13.0 - 17.0 g/dL   HCT 28.440.6 13.239.0 - 44.052.0 %   MCV 85.5 78.0 - 100.0 fL   MCH 29.3 26.0 - 34.0 pg   MCHC 34.2 30.0 - 36.0 g/dL   RDW 10.213.6 72.511.5 - 36.615.5 %   Platelets 287 150 - 400 K/uL   Neutrophils Relative % 58 %   Lymphocytes Relative 33 %   Monocytes Relative 8 %   Eosinophils Relative 1 %   Basophils Relative 0 %   Neutro Abs 10.4 (H) 1.7 - 7.7 K/uL   Lymphs Abs 5.9 (H) 0.7 - 4.0 K/uL   Monocytes Absolute 1.4 (H) 0.1 - 1.0 K/uL   Eosinophils Absolute 0.2 0.0 - 0.7 K/uL   Basophils Absolute 0.0 0.0 - 0.1 K/uL   WBC Morphology VACUOLATED NEUTROPHILS   Ethanol  Result Value Ref Range   Alcohol, Ethyl (B) <5 <5 mg/dL  Rapid urine drug screen (hospital performed)  Result Value Ref Range   Opiates NONE DETECTED NONE DETECTED   Cocaine POSITIVE (A) NONE DETECTED   Benzodiazepines NONE DETECTED NONE DETECTED   Amphetamines NONE DETECTED NONE DETECTED   Tetrahydrocannabinol NONE DETECTED NONE DETECTED   Barbiturates NONE DETECTED NONE DETECTED  Ammonia  Result Value Ref Range   Ammonia 87 (H) 9 - 35 umol/L  Ammonia  Result  Value Ref Range   Ammonia 60 (H) 9 - 35 umol/L  Valproic acid level  Result Value Ref Range   Valproic Acid Lvl 102 (H) 50.0 - 100.0 ug/mL  Valproic acid level  Result Value Ref Range   Valproic Acid Lvl 99 50.0 - 100.0 ug/mL   Ct Head Wo Contrast  Result Date: 09/11/2016 CLINICAL DATA:  30 y/o M; Depakote overdose and fall. Bruising to the left eye. EXAM: CT HEAD AND ORBITS WITHOUT CONTRAST TECHNIQUE: Contiguous axial images were obtained from the base of the skull through the vertex without contrast. Multidetector CT imaging of the orbits was performed using the standard protocol without intravenous contrast. COMPARISON:  07/19/2015 CT maxillofacial.  07/20/2013 CT head. FINDINGS: CT HEAD FINDINGS Brain: No evidence of acute infarction, hemorrhage, hydrocephalus, extra-axial collection or mass lesion/mass effect. Vascular: No hyperdense vessel or unexpected calcification. Skull: Normal. Negative for fracture or focal lesion. Slightly buckled fracture of the  nasal bones with mild displacement on the right (series 4, image 16 and 19). Other: None. CT ORBITS FINDINGS Orbits: No traumatic or inflammatory finding. Globes, optic nerves, orbital fat, extraocular muscles, vascular structures, and lacrimal glands are normal. Visualized sinuses: Mild diffuse paranasal sinus mucosal thickening with a large right maxillary sinus mucous retention cyst and partial opacification of the right sphenoid sinus. Underpneumatized frontal sinuses. Soft tissues: Swelling of the left periorbital soft tissues compatible with contusion. IMPRESSION: 1. No acute intracranial abnormality. 2. No traumatic or inflammatory findings of the orbits. Swelling of left periorbital soft tissues compatible with contusion. 3. Slightly buckle fracture of the nasal bones with mild displacement on the right. 4. Moderate paranasal sinus disease. Electronically Signed   By: Mitzi HansenLance  Furusawa-Stratton M.D.   On: 09/11/2016 02:49   Ct Orbits Wo Contrast  Result Date: 09/11/2016 CLINICAL DATA:  30 y/o M; Depakote overdose and fall. Bruising to the left eye. EXAM: CT HEAD AND ORBITS WITHOUT CONTRAST TECHNIQUE: Contiguous axial images were obtained from the base of the skull through the vertex without contrast. Multidetector CT imaging of the orbits was performed using the standard protocol without intravenous contrast. COMPARISON:  07/19/2015 CT maxillofacial.  07/20/2013 CT head. FINDINGS: CT HEAD FINDINGS Brain: No evidence of acute infarction, hemorrhage, hydrocephalus, extra-axial collection or mass lesion/mass effect. Vascular: No hyperdense vessel or unexpected calcification. Skull: Normal. Negative for fracture or focal lesion. Slightly buckled fracture of the nasal bones with mild displacement on the right (series 4, image 16 and 19). Other: None. CT ORBITS FINDINGS Orbits: No traumatic or inflammatory finding. Globes, optic nerves, orbital fat, extraocular muscles, vascular structures, and lacrimal glands  are normal. Visualized sinuses: Mild diffuse paranasal sinus mucosal thickening with a large right maxillary sinus mucous retention cyst and partial opacification of the right sphenoid sinus. Underpneumatized frontal sinuses. Soft tissues: Swelling of the left periorbital soft tissues compatible with contusion. IMPRESSION: 1. No acute intracranial abnormality. 2. No traumatic or inflammatory findings of the orbits. Swelling of left periorbital soft tissues compatible with contusion. 3. Slightly buckle fracture of the nasal bones with mild displacement on the right. 4. Moderate paranasal sinus disease. Electronically Signed   By: Mitzi HansenLance  Furusawa-Stratton M.D.   On: 09/11/2016 02:49      Edwin Mclean Camprubi-Soms, PA-C 09/11/16 1003    Mancel BaleElliott Wentz, MD 09/11/16 2315

## 2016-09-11 NOTE — ED Notes (Signed)
Pt consumed the Depakote at 2300

## 2016-09-11 NOTE — ED Notes (Signed)
Given report from night shift nurse that pt is cleared by poison control. This Clinical research associatewriter called poison control with concern that Depakote is ER. Per Silva BandyKristi, poison control, recheck valproic acid at 0830, if trending downward pt is cleared otherwise plan to draw another valproic level in 4-6 hours post 0830 draw.

## 2016-09-11 NOTE — ED Triage Notes (Signed)
Pt consumed 50-60 500mg  Depakote ER tablets, 16oz beer and (smoked) crack.  Pt did this is SI as he is "tired of living and wanted to blow my heart up".  Pt had self harm incident with SI before thanksgiving.  [facility] called poison control and were told: 1. Extended release tablets, symptoms will continue for 6h 2. Use activated charcoal 3. Watch for lethargy, sedation and tachycardia 4. If the QT gets over 0.500 - can lead to torsades (for ems it was 0.368s/0.397s.  And the second EKG 0.368s/0.403s.  Pt is alert and oriented.  Has a black eye from a fall.  Will check with EDP

## 2016-09-12 DIAGNOSIS — Z79899 Other long term (current) drug therapy: Secondary | ICD-10-CM

## 2016-09-12 DIAGNOSIS — Z8249 Family history of ischemic heart disease and other diseases of the circulatory system: Secondary | ICD-10-CM

## 2016-09-12 DIAGNOSIS — F3131 Bipolar disorder, current episode depressed, mild: Secondary | ICD-10-CM | POA: Diagnosis not present

## 2016-09-12 DIAGNOSIS — F1410 Cocaine abuse, uncomplicated: Secondary | ICD-10-CM | POA: Diagnosis not present

## 2016-09-12 DIAGNOSIS — F1220 Cannabis dependence, uncomplicated: Secondary | ICD-10-CM

## 2016-09-12 DIAGNOSIS — Z880 Allergy status to penicillin: Secondary | ICD-10-CM

## 2016-09-12 DIAGNOSIS — R45851 Suicidal ideations: Secondary | ICD-10-CM

## 2016-09-12 DIAGNOSIS — F17210 Nicotine dependence, cigarettes, uncomplicated: Secondary | ICD-10-CM

## 2016-09-12 DIAGNOSIS — Z91010 Allergy to peanuts: Secondary | ICD-10-CM

## 2016-09-12 NOTE — Progress Notes (Signed)
CSW contacted the following facilities to inquire about bed availability: [facility], [facility], [facility], [facility], [facility], [facility], [facility], [facility], [facility], [facility], [facility], [facility], [facility], [facility], [facility] and [facility].    The following facilities reported being at capacity: [facility], [facility], [facility], [facility] and [facility].   The following facilities reported bed availability: [facility], [facility], [facility], [facility], [facility], [facility], [facility] (3 adult - low acuity) and [facility].   The following facilities did not answer: [facility], [facility] and [facility].  CSW faxed patient to the following facilities: [facility], [facility], [facility], [facility], [facility], [facility], [facility], [facility], [facility], [facility] and [facility]. 

## 2016-09-12 NOTE — Consult Note (Addendum)
[facility] Consult   Reason for Consult: Intentional suicide attempt Referring Physician:  EDP  Patient Identification: Edwin Martinez MRN:  [id number] Principal Diagnosis: Bipolar disorder, current episode depressed, mild ([place]) Diagnosis:   Patient Active Problem List   Diagnosis Date Noted  . Bipolar disorder, current episode depressed, mild ([place]) [F31.31] 05/04/2016    Priority: High  . Cocaine use disorder, mild, abuse [F14.10] 07/27/2016  . Cannabis use disorder, moderate, dependence ([place]) [F12.20] 07/27/2016  . Tobacco use disorder [F17.200] 07/27/2016  . Intentional drug overdose ([place]) [T50.902A] 07/18/2016  . Asthma [J45.909] 05/20/2007  . Human immunodeficiency virus (HIV) disease ([place]) [B20] 05/05/2007    Total Time spent with patient: 25 minutes   Subjective:   Edwin Martinez is a 30 y.o. male patient admitted with suicide attempt. Pt seen and chart reviewed. Pt is alert/oriented x4, calm, cooperative, and appropriate to situation. Pt denies homicidal ideation and psychosis and does not appear to be responding to internal stimuli. Pt continues to endorse suicidal ideation and states that he wants to die. Pt reports that his medications have not been effective. Continues to meet inpatient criteria.   HPI: I have reviewed and concur with HPI elements below, modified as follows: Edwin Martinez is an 30 y.o. male that present this date with thoughts of self harm by attempted overdose. Patient stated he doesn't wish to speak to any staff due to being released from [facility] to early last time (09/04/16) he was here which made him relapse. Notes and collateral information were utilized to complete assessment. Per notes: "Patient consumed 50-60 558m Depakote ER tablets, 16oz beer and (smoked) crack. Patient stated he is "tired of living and wanted to blow my heart up". Pt had self harm incident with SI before thanksgiving. [facility] called poison control and were told: Pt  is blaming hospital staff saying that he is suing the hospital because he was at the hospital within the past two weeks and was discharged. Pt is requesting medications and extended treatment. He reports being homeless not working in ten years and wanting to receive disability". Patient reports that his mother passed away six months ago and he has one brother that is incarcerated. Pt has a black lt eye that he received from falling "in the street on some leaves." Pt says that he has voices that tell him to kill himself and "why am I still here?" Pt reports using crack recently. Pt reports S/I thoughts and denies H/I thoughts". Patient has a history (per note review) of being diagnosed with schizophrenia, and previous attempts at self harm. WJoya GaskinsRN noted this date: "Pt is blaming hospital staff saying that he is suing the hospital because he was at the hospital within the past two weeks and was discharged. Pt is requesting medications and extended treatment". Case was staffed with  MD who recommended  patient be re-evaluated in the a.m.   Today on 09/12/16, pt seen as above. Pt has been cooperative with staff but continues to meet inpatient criteria based on his overdose.   Past Psychiatric History: as above  Risk to Self: Suicidal Ideation: No-Currently Present Suicidal Intent: No-Currently Present Is patient at risk for suicide?: No Suicidal Plan?: No-Currently Present Specify Current Suicidal Plan:  Access to Means: denies Specify Access to Suicidal Means: pt has "old" medication What has been your use of drugs/alcohol within the last 12 months?: current use How many times?: 3 (per notes) Other Self Harm Risks: na Triggers for Past Attempts:  Other (Comment) (relationship issues) Intentional Self Injurious Behavior: Cutting (per record review/past) Comment - Self Injurious Behavior: cutting Risk to Others: Homicidal Ideation: No Thoughts of Harm to Others: No Current Homicidal Intent:  No Current Homicidal Plan: No Access to Homicidal Means: No Identified Victim:  (na) History of harm to others?: No Assessment of Violence: None Noted Violent Behavior Description:  (na) Does patient have access to weapons?: No Criminal Charges Pending?: Yes Describe Pending Criminal Charges: Larceny  (per records) Does patient have a court date: Yes Court Date: 10/06/16 Prior Inpatient Therapy: Prior Inpatient Therapy: Yes Prior Therapy Dates: 2017 Prior Therapy Facilty/Provider(s): [facility] Reason for Treatment: S/I, MH issues Prior Outpatient Therapy: Prior Outpatient Therapy: No Prior Therapy Dates:  (na) Prior Therapy Facilty/Provider(s): na Reason for Treatment: na Does patient have an ACCT team?: No Does patient have Intensive In-House Services?  : No Does patient have [name] services? : No Does patient have P4CC services?: No  Past Medical History:  Past Medical History:  Diagnosis Date  . ADHD (attention deficit hyperactivity disorder)   . ADHD (attention deficit hyperactivity disorder) 09/12/2012  . Anxiety   . Asthma   . Bipolar 1 disorder ([place])   . Bipolar disorder ([place])   . Epileptic seizures ([place])   . HIV (human immunodeficiency virus infection) ([place])   . Hypertension   . Schizophrenia ([place])   . Seizures ([place])     Past Surgical History:  Procedure Laterality Date  . DENTAL SURGERY     Family History:  Family History  Problem Relation Age of Onset  . Huntington's disease Father   . Heart disease Mother   . Suicidality Maternal Uncle   . Suicidality Maternal Grandmother    Family Psychiatric  History:  Social History:  History  Alcohol Use  . 1.2 oz/week  . 2 Standard drinks or equivalent per week    Comment: once week      History  Drug Use  . Frequency: 2.0 times per week  . Types: Marijuana, Cocaine    Comment: every other day     Social History   Social History  . Marital status: Single    Spouse name: N/A  . Number of children: N/A   . Years of education: N/A   Social History Main Topics  . Smoking status: Current Every Day Smoker    Packs/day: 0.50    Types: Cigarettes    Start date: 09/29/1991  . Smokeless tobacco: Never Used  . Alcohol use 1.2 oz/week    2 Standard drinks or equivalent per week     Comment: once week   . Drug use:     Frequency: 2.0 times per week    Types: Marijuana, Cocaine     Comment: every other day   . Sexual activity: Yes    Birth control/ protection: Condom   Other Topics Concern  . None   Social History Narrative   ** Merged History Encounter **       Additional Social History:    Allergies:   Allergies  Allergen Reactions  . Magnesium-Containing Compounds Other (See Comments)    This medication is contraindicated with pts HIV meds.    . Peanut-Containing Drug Products Anaphylaxis  . Atripla [Efavirenz-Emtricitab-Tenofovir] Other (See Comments)    Reaction:  Suicidal thoughts   . Esomeprazole Magnesium Cough  . Bactrim [Sulfamethoxazole-Trimethoprim] Rash  . Penicillins Rash and Other (See Comments)    Has patient had a PCN reaction causing immediate rash, facial/tongue/throat swelling,  SOB or lightheadedness with hypotension: Yes Has patient had a PCN reaction causing severe rash involving mucus membranes or skin necrosis: No Has patient had a PCN reaction that required hospitalization No Has patient had a PCN reaction occurring within the last 10 years: No If all of the above answers are "NO", then may proceed with Cephalosporin use.    Labs:  Results for orders placed or performed during the hospital encounter of 09/11/16 (from the past 48 hour(s))  Rapid urine drug screen (hospital performed)     Status: Abnormal   Collection Time: 09/11/16 12:34 AM  Result Value Ref Range   Opiates NONE DETECTED NONE DETECTED   Cocaine POSITIVE (A) NONE DETECTED   Benzodiazepines NONE DETECTED NONE DETECTED   Amphetamines NONE DETECTED NONE DETECTED   Tetrahydrocannabinol  NONE DETECTED NONE DETECTED   Barbiturates NONE DETECTED NONE DETECTED    Comment:        DRUG SCREEN FOR MEDICAL PURPOSES ONLY.  IF CONFIRMATION IS NEEDED FOR ANY PURPOSE, NOTIFY LAB WITHIN 5 DAYS.        LOWEST DETECTABLE LIMITS FOR URINE DRUG SCREEN Drug Class       Cutoff (ng/mL) Amphetamine      1000 Barbiturate      200 Benzodiazepine   630 Tricyclics       160 Opiates          300 Cocaine          300 THC              50   Comprehensive metabolic panel     Status: Abnormal   Collection Time: 09/11/16 12:36 AM  Result Value Ref Range   Sodium 137 135 - 145 mmol/L   Potassium 3.6 3.5 - 5.1 mmol/L   Chloride 103 101 - 111 mmol/L   CO2 22 22 - 32 mmol/L   Glucose, Bld 77 65 - 99 mg/dL   BUN 11 6 - 20 mg/dL   Creatinine, Ser 0.75 0.61 - 1.24 mg/dL   Calcium 9.0 8.9 - 10.3 mg/dL   Total Protein 7.6 6.5 - 8.1 g/dL   Albumin 4.5 3.5 - 5.0 g/dL   AST 42 (H) 15 - 41 U/L   ALT 22 17 - 63 U/L   Alkaline Phosphatase 59 38 - 126 U/L   Total Bilirubin 0.4 0.3 - 1.2 mg/dL   GFR calc non Af Amer >60 >60 mL/min   GFR calc Af Amer >60 >60 mL/min    Comment: (NOTE) The eGFR has been calculated using the CKD EPI equation. This calculation has not been validated in all clinical situations. eGFR's persistently <60 mL/min signify possible Chronic Kidney Disease.    Anion gap 12 5 - 15  CBC with Differential     Status: Abnormal   Collection Time: 09/11/16 12:36 AM  Result Value Ref Range   WBC 17.9 (H) 4.0 - 10.5 K/uL   RBC 4.75 4.22 - 5.81 MIL/uL   Hemoglobin 13.9 13.0 - 17.0 g/dL   HCT 40.6 39.0 - 52.0 %   MCV 85.5 78.0 - 100.0 fL   MCH 29.3 26.0 - 34.0 pg   MCHC 34.2 30.0 - 36.0 g/dL   RDW 13.6 11.5 - 15.5 %   Platelets 287 150 - 400 K/uL   Neutrophils Relative % 58 %   Lymphocytes Relative 33 %   Monocytes Relative 8 %   Eosinophils Relative 1 %   Basophils Relative 0 %   Neutro Abs 10.4 (  H) 1.7 - 7.7 K/uL   Lymphs Abs 5.9 (H) 0.7 - 4.0 K/uL   Monocytes Absolute 1.4  (H) 0.1 - 1.0 K/uL   Eosinophils Absolute 0.2 0.0 - 0.7 K/uL   Basophils Absolute 0.0 0.0 - 0.1 K/uL   WBC Morphology VACUOLATED NEUTROPHILS   Acetaminophen level     Status: Abnormal   Collection Time: 09/11/16  3:58 AM  Result Value Ref Range   Acetaminophen (Tylenol), Serum <10 (L) 10 - 30 ug/mL    Comment:        THERAPEUTIC CONCENTRATIONS VARY SIGNIFICANTLY. A RANGE OF 10-30 ug/mL MAY BE AN EFFECTIVE CONCENTRATION FOR MANY PATIENTS. HOWEVER, SOME ARE BEST TREATED AT CONCENTRATIONS OUTSIDE THIS RANGE. ACETAMINOPHEN CONCENTRATIONS >150 ug/mL AT 4 HOURS AFTER INGESTION AND >50 ug/mL AT 12 HOURS AFTER INGESTION ARE OFTEN ASSOCIATED WITH TOXIC REACTIONS.   Salicylate level     Status: None   Collection Time: 09/11/16  3:58 AM  Result Value Ref Range   Salicylate Lvl <2.9 2.8 - 30.0 mg/dL  Ethanol     Status: None   Collection Time: 09/11/16  3:58 AM  Result Value Ref Range   Alcohol, Ethyl (B) <5 <5 mg/dL    Comment:        LOWEST DETECTABLE LIMIT FOR SERUM ALCOHOL IS 5 mg/dL FOR MEDICAL PURPOSES ONLY   Valproic acid level     Status: Abnormal   Collection Time: 09/11/16  3:58 AM  Result Value Ref Range   Valproic Acid Lvl 102 (H) 50.0 - 100.0 ug/mL  Ammonia     Status: Abnormal   Collection Time: 09/11/16  4:12 AM  Result Value Ref Range   Ammonia 87 (H) 9 - 35 umol/L  Ammonia     Status: Abnormal   Collection Time: 09/11/16  8:32 AM  Result Value Ref Range   Ammonia 60 (H) 9 - 35 umol/L  Valproic acid level     Status: None   Collection Time: 09/11/16  8:32 AM  Result Value Ref Range   Valproic Acid Lvl 99 50.0 - 100.0 ug/mL    Current Facility-Administered Medications  Medication Dose Route Frequency Provider Last Rate Last Dose  . abacavir-dolutegravir-lamiVUDine (TRIUMEQ) [phone] MG per tablet 1 tablet  1 tablet Oral QHS Junius Creamer, NP   1 tablet at 09/11/16 2046  . albuterol (PROVENTIL HFA;VENTOLIN HFA) 108 (90 Base) MCG/ACT inhaler 2 puff  2 puff  Inhalation Q6H PRN Junius Creamer, NP      . benztropine (COGENTIN) tablet 1 mg  1 mg Oral BID Corena Pilgrim, MD   1 mg at 09/12/16 0920  . hydrOXYzine (ATARAX/VISTARIL) tablet 25 mg  25 mg Oral Q6H PRN Junius Creamer, NP      . risperiDONE (RISPERDAL) tablet 1 mg  1 mg Oral BID Corena Pilgrim, MD   1 mg at 09/12/16 0920  . traZODone (DESYREL) tablet 50 mg  50 mg Oral QHS PRN Junius Creamer, NP       Current Outpatient Prescriptions  Medication Sig Dispense Refill  . abacavir-dolutegravir-lamiVUDine (TRIUMEQ) 600-50-300 MG tablet Take 1 tablet by mouth at bedtime.    Marland Kitchen albuterol (PROVENTIL HFA;VENTOLIN HFA) 108 (90 Base) MCG/ACT inhaler Inhale 2 puffs into the lungs every 6 (six) hours as needed for wheezing or shortness of breath. 1 Inhaler 0  . benztropine (COGENTIN) 1 MG tablet Take 1 tablet (1 mg total) by mouth at bedtime. 30 tablet 0  . citalopram (CELEXA) 20 MG tablet Take 1 tablet (20 mg  total) by mouth daily. 30 tablet 0  . hydrOXYzine (ATARAX/VISTARIL) 25 MG tablet Take 1 tablet (25 mg total) by mouth every 6 (six) hours as needed for anxiety. 30 tablet 0  . risperiDONE (RISPERDAL) 1 MG tablet Take 1 tablet (1 mg total) by mouth daily. Take 3 tablet  (32m total) by mouth at bed time 90 tablet 0  . traZODone (DESYREL) 50 MG tablet Take 1 tablet (50 mg total) by mouth at bedtime as needed for sleep. 30 tablet 0    Musculoskeletal: Strength & Muscle Tone: within normal limits Gait & Station: normal Patient leans: N/A  Psychiatric Specialty Exam: Physical Exam  Psychiatric: His affect is blunt. His speech is delayed. He is slowed and withdrawn. Cognition and memory are normal. He expresses impulsivity. He exhibits a depressed mood. He expresses suicidal ideation. He expresses suicidal plans.    Review of Systems  Constitutional: Positive for malaise/fatigue.  HENT: Negative.   Eyes: Negative.   Respiratory: Negative.   Cardiovascular: Negative.   Gastrointestinal: Negative.    Genitourinary: Negative.   Musculoskeletal: Negative.   Skin: Negative.   Neurological: Negative.   Endo/Heme/Allergies: Negative.   Psychiatric/Behavioral: Positive for depression, substance abuse and suicidal ideas. The patient has insomnia.   All other systems reviewed and are negative.   Blood pressure 118/59, pulse 80, temperature 97.8 F (36.6 C), temperature source Oral, resp. rate 16, SpO2 100 %.There is no height or weight on file to calculate BMI.  General Appearance: Casual and Fairly Groomed  Eye Contact:  Good  Speech:  Clear and Coherent and Normal Rate  Volume:  Normal  Mood:  Anxious, Depressed and Dysphoric  Affect:  Non-Congruent  Thought Process:  Coherent  Orientation:  Full (Time, Place, and Person)  Thought Content:  Symptoms, worries, concerns, focused on treatment  Suicidal Thoughts:  Yes.  with intent/plan and intentional overdose  Homicidal Thoughts:  No   Memory:  Immediate;   Good Recent;   Good Remote;   Good  Judgement:  Poor  Insight:  Lacking  Psychomotor Activity:  Psychomotor Retardation  Concentration:  Concentration: Good and Attention Span: Good  Recall:  Good  Fund of Knowledge:  Good  Language:  Good  Akathisia:  No  Handed:  Right  AIMS (if indicated):     Assets:  Communication Skills Desire for Improvement  ADL's:  Intact  Cognition:  WNL  Sleep:   poor   Treatment Plan Summary: Bipolar disorder, current episode depressed, mild (HCC) unstable, managed as below:   Medications:  -Risperidone 1107mpo bid for psychosis  -Trazodone 505mo qhs prn insomnia  -Vistaril 36m38m q6h prn  -Cogentin 1mg 37mbid for EPS prophylaxis   Disposition: Inpatient psychiatric hospitalization   WithrBenjamine Mola 09/12/2016 11:21 AM  Patient seen face-to-face for psychiatric evaluation, chart reviewed and case discussed with the physician extender and developed treatment plan. Reviewed the information documented and agree with the treatment  plan. MojeeCorena Pilgrim

## 2016-09-12 NOTE — ED Notes (Signed)
Introduced self to patient. Pt oriented to unit expectations.  Assessed pt for:  A) Anxiety &/or agitation: Pt is calm and cooperative today. He continues to feel like killing himself. He took a shower after being prompted by the Psychiatrist, Dr. Jannifer FranklinAkintayo.   S) Safety: Safety maintained with q-15-minute checks and hourly rounds by staff.  A) ADLs: Pt able to perform ADLs independently.  P) Pick-Up (room cleanliness): Pt's room clean and free of clutter.

## 2016-09-12 NOTE — ED Notes (Signed)
SBAR Report received from previous nurse. Pt received calm and visible on unit. Pt denies current SI/ HI, A/V H, depression, anxiety, or pain at this time, and appears otherwise stable and free of distress. Pt reminded of camera surveillance, q 15 min rounds, and rules of the milieu. Will continue to assess. 

## 2016-09-13 LAB — AMMONIA: Ammonia: 41 umol/L — ABNORMAL HIGH (ref 9–35)

## 2016-09-13 MED ORDER — RISPERIDONE 2 MG PO TABS
2.0000 mg | ORAL_TABLET | Freq: Two times a day (BID) | ORAL | Status: DC
  Administered 2016-09-13 – 2016-09-14 (×2): 2 mg via ORAL
  Filled 2016-09-13 (×2): qty 1

## 2016-09-13 NOTE — ED Notes (Signed)
SBAR Report received from previous nurse. Pt received calm and visible on unit. Pt denies current SI/ HI, A/V H, depression, anxiety, or pain at this time, and appears otherwise stable and free of distress. Pt reminded of camera surveillance, q 15 min rounds, and rules of the milieu. Will continue to assess. 

## 2016-09-13 NOTE — BH Assessment (Signed)
[facility] Assessment Progress Note This Clinical research associatewriter spoke with patient earlier this date to discuss treatment progress. Patient continues to have thoughts of self harm with a plan to run into traffic. Patient stated "I can't control anything anymore." Patient is vague in reference to symptoms stating "it's everything." Patient states, "I don't want to talk anymore." Patient cannot contact for safety. Case was staffed with Akintayo MD who reccommended an inpatient admission.

## 2016-09-13 NOTE — Progress Notes (Signed)
Pt A & O X4. Denies HI, AVH and pain. Endorsed SI "I'm just the same like when I first came here; suicidal". Verbally contracts for safety. Pt presents with depressed affect and mood on initial contact. Guarded, withdrawned with poor eye contact at the time. However, pt became verbally interactive with staff as shift progressed. Pt tolerated all PO intake well. Took his medications when offered. Encouraged pt to voice concerns and comply with current treatment regimen. Q 15 minutes checks remains effective without self harm gestures or outburst to note thus far this shift. Pt awaiting placement. Will continue to monitor pt for safety and mood stability.

## 2016-09-14 DIAGNOSIS — Z8489 Family history of other specified conditions: Secondary | ICD-10-CM | POA: Diagnosis not present

## 2016-09-14 DIAGNOSIS — F3131 Bipolar disorder, current episode depressed, mild: Secondary | ICD-10-CM

## 2016-09-14 NOTE — ED Notes (Signed)
Pt discharged home. Discharged instructions read to pt who verbalized understanding. All belongings returned to pt who signed for same. Denies SI/HI, is not delusional and not responding to internal stimuli. Escorted pt to the ED exit.    

## 2016-09-14 NOTE — Discharge Instructions (Signed)
You have indicated that you are interested in finding housing in an environment that supports sober living.  Consider contacting one of the following programs, or refer [facility] the [facility] to find a provider that is right for you:       [facility]      [address]      [place], [place] [address]      [phone]       [facility]      [phone]

## 2016-09-14 NOTE — [facility] Suicide Risk Assessment (Signed)
Suicide Risk Assessment  Discharge Assessment   [facility]   Principal Problem: Bipolar disorder, current episode depressed, mild ([facility]) Discharge Diagnoses:  Patient Active Problem List   Diagnosis Date Noted  . Bipolar disorder, current episode depressed, mild ([facility]) [F31.31] 05/04/2016    Priority: High  . Cocaine use disorder, mild, abuse [F14.10] 07/27/2016  . Cannabis use disorder, moderate, dependence ([facility]) [F12.20] 07/27/2016  . Tobacco use disorder [F17.200] 07/27/2016  . Intentional drug overdose ([facility]) [T50.902A] 07/18/2016  . Asthma [J45.909] 05/20/2007  . Human immunodeficiency virus (HIV) disease ([facility]) [B20] 05/05/2007    Total Time spent with patient: 30 minutes  Musculoskeletal: Strength & Muscle Tone: within normal limits Gait & Station: normal Patient leans: N/A  Psychiatric Specialty Exam: Physical Exam  Constitutional: He is oriented to person, place, and time. He appears well-developed and well-nourished.  HENT:  Head: Normocephalic.  Neck: Normal range of motion.  Respiratory: Effort normal.  Musculoskeletal: Normal range of motion.  Neurological: He is alert and oriented to person, place, and time.  Psychiatric: His speech is normal and behavior is normal. Judgment and thought content normal. Cognition and memory are normal. He exhibits a depressed mood.    Review of Systems  Psychiatric/Behavioral: Positive for depression and substance abuse.  All other systems reviewed and are negative.   Blood pressure 123/62, pulse (!) 53, temperature 97.7 F (36.5 C), temperature source Oral, resp. rate 18, SpO2 100 %.There is no height or weight on file to calculate BMI.  General Appearance: Casual  Eye Contact:  Good  Speech:  Normal Rate  Volume:  Normal  Mood:  Depressed, mild  Affect:  Congruent  Thought Process:  Coherent and Descriptions of Associations: Intact  Orientation:  Full (Time, Place, and Person)  Thought Content:   WDL and Logical  Suicidal Thoughts:  No  Homicidal Thoughts:  No  Memory:  Immediate;   Good Recent;   Good Remote;   Good  Judgement:  Fair  Insight:  Fair  Psychomotor Activity:  Normal  Concentration:  Concentration: Good and Attention Span: Good  Recall:  Good  Fund of Knowledge:  Fair  Language:  Good  Akathisia:  No  Handed:  Right  AIMS (if indicated):     Assets:  Leisure Time Physical Health Resilience Social Support  ADL's:  Intact  Cognition:  WNL  Sleep:       Mental Status Per Nursing Assessment::   On Admission:    cocaine abuse with suicidal ideations  Demographic Factors:  Male and Caucasian  Loss Factors: NA  Historical Factors: NA  Risk Reduction Factors:   Sense of responsibility to family, Positive social support and Positive therapeutic relationship  Continued Clinical Symptoms:  Depression, mild  Cognitive Features That Contribute To Risk:  None    Suicide Risk:  Minimal: No identifiable suicidal ideation.  Patients presenting with no risk factors but with morbid ruminations; may be classified as minimal risk based on the severity of the depressive symptoms    Plan Of Care/Follow-up recommendations:  Activity:  as tolerated Diet:  heart healthy diet  LORD, JAMISON, NP 09/14/2016, 2:04 PM

## 2016-09-14 NOTE — Consult Note (Signed)
[facility]   Reason for Consult:  Cocaine abuse with suicidal ideations Referring Physician:  EDP Patient Identification: Edwin Clossdward A Primiano MRN:  [id number] Principal Diagnosis: Bipolar disorder, current episode depressed, mild (HCC) Diagnosis:   Patient Active Problem List   Diagnosis Date Noted  . Bipolar disorder, current episode depressed, mild (HCC) [F31.31] 05/04/2016    Priority: High  . Cocaine use disorder, mild, abuse [F14.10] 07/27/2016  . Cannabis use disorder, moderate, dependence (HCC) [F12.20] 07/27/2016  . Tobacco use disorder [F17.200] 07/27/2016  . Intentional drug overdose (HCC) [T50.902A] 07/18/2016  . Asthma [J45.909] 05/20/2007  . Human immunodeficiency virus (HIV) disease (HCC) [B20] 05/05/2007    Total Time spent with patient: 30 minutes  Subjective:   Edwin Martinez is a 30 y.o. male patient does not warrant admission.  HPI:  30 yo male who came to the ED after abusing cocaine and an alleged overdose.  His medications were restarted and he stabilized.  No suicidal/homicidal ideations, hallucinations, and alcohol/drug withdrawal symptoms.  His motivation appears to be for shelter and disability.  Resources provided at discharge.  Past Psychiatric History: bipolar disorder, substance abuse  Risk to Self: No Risk to Others: No Prior Inpatient Therapy: Prior Inpatient Therapy: Yes Prior Therapy Dates: 2017 [facility] Reason for Treatment: S/I, MH issues Prior Outpatient Therapy: Prior Outpatient Therapy: No Prior Therapy Dates:  (na) Prior Therapy Facilty/Provider(s): na Reason for Treatment: na Does patient have an ACCT team?: No Does patient have Intensive In-House Services?  : No Does patient have [name] services? : No Does patient have P4CC services?: No  Past Medical History:  Past Medical History:  Diagnosis Date  . ADHD (attention deficit hyperactivity disorder)   . ADHD (attention  deficit hyperactivity disorder) 09/12/2012  . Anxiety   . Asthma   . Bipolar 1 disorder (HCC)   . Bipolar disorder (HCC)   . Epileptic seizures (HCC)   . HIV (human immunodeficiency virus infection) (HCC)   . Hypertension   . Schizophrenia (HCC)   . Seizures (HCC)     Past Surgical History:  Procedure Laterality Date  . DENTAL SURGERY     Family History:  Family History  Problem Relation Age of Onset  . Huntington's disease Father   . Heart disease Mother   . Suicidality Maternal Uncle   . Suicidality Maternal Grandmother    Family Psychiatric  History: none Social History:  History  Alcohol Use  . 1.2 oz/week  . 2 Standard drinks or equivalent per week    Comment: once week      History  Drug Use  . Frequency: 2.0 times per week  . Types: Marijuana, Cocaine    Comment: every other day     Social History   Social History  . Marital status: Single    Spouse name: N/A  . Number of children: N/A  . Years of education: N/A   Social History Main Topics  . Smoking status: Current Every Day Smoker    Packs/day: 0.50    Types: Cigarettes    Start date: 09/29/1991  . Smokeless tobacco: Never Used  . Alcohol use 1.2 oz/week    2 Standard drinks or equivalent per week     Comment: once week   . Drug use:     Frequency: 2.0 times per week    Types: Marijuana, Cocaine     Comment: every other day   . Sexual activity: Yes    Birth control/  protection: Condom   Other Topics Concern  . None   Social History Narrative   ** Merged History Encounter **       Additional Social History:    Allergies:   Allergies  Allergen Reactions  . Magnesium-Containing Compounds Other (See Comments)    This medication is contraindicated with pts HIV meds.    . Peanut-Containing Drug Products Anaphylaxis  . Atripla [Efavirenz-Emtricitab-Tenofovir] Other (See Comments)    Reaction:  Suicidal thoughts   . Esomeprazole Magnesium Cough  . Bactrim [Sulfamethoxazole-Trimethoprim]  Rash  . Penicillins Rash and Other (See Comments)    Has patient had a PCN reaction causing immediate rash, facial/tongue/throat swelling, SOB or lightheadedness with hypotension: Yes Has patient had a PCN reaction causing severe rash involving mucus membranes or skin necrosis: No Has patient had a PCN reaction that required hospitalization No Has patient had a PCN reaction occurring within the last 10 years: No If all of the above answers are "NO", then may proceed with Cephalosporin use.    Labs:  Results for orders placed or performed during the hospital encounter of 09/11/16 (from the past 48 hour(s))  Ammonia     Status: Abnormal   Collection Time: 09/13/16  1:11 PM  Result Value Ref Range   Ammonia 41 (H) 9 - 35 umol/L    Current Facility-Administered Medications  Medication Dose Route Frequency Provider Last Rate Last Dose  . abacavir-dolutegravir-lamiVUDine (TRIUMEQ) 600-50-300 MG per tablet 1 tablet  1 tablet Oral QHS Earley Favor, NP   1 tablet at 09/13/16 2113  . albuterol (PROVENTIL HFA;VENTOLIN HFA) 108 (90 Base) MCG/ACT inhaler 2 puff  2 puff Inhalation Q6H PRN Earley Favor, NP      . benztropine (COGENTIN) tablet 1 mg  1 mg Oral BID Thedore Mins, MD   1 mg at 09/14/16 0953  . hydrOXYzine (ATARAX/VISTARIL) tablet 25 mg  25 mg Oral Q6H PRN Earley Favor, NP      . risperiDONE (RISPERDAL) tablet 2 mg  2 mg Oral BID Thedore Mins, MD   2 mg at 09/14/16 0953  . traZODone (DESYREL) tablet 50 mg  50 mg Oral QHS PRN Earley Favor, NP   50 mg at 09/13/16 2113   Current Outpatient Prescriptions  Medication Sig Dispense Refill  . abacavir-dolutegravir-lamiVUDine (TRIUMEQ) 600-50-300 MG tablet Take 1 tablet by mouth at bedtime.    Marland Kitchen albuterol (PROVENTIL HFA;VENTOLIN HFA) 108 (90 Base) MCG/ACT inhaler Inhale 2 puffs into the lungs every 6 (six) hours as needed for wheezing or shortness of breath. 1 Inhaler 0  . benztropine (COGENTIN) 1 MG tablet Take 1 tablet (1 mg total) by mouth at  bedtime. 30 tablet 0  . citalopram (CELEXA) 20 MG tablet Take 1 tablet (20 mg total) by mouth daily. 30 tablet 0  . hydrOXYzine (ATARAX/VISTARIL) 25 MG tablet Take 1 tablet (25 mg total) by mouth every 6 (six) hours as needed for anxiety. 30 tablet 0  . risperiDONE (RISPERDAL) 1 MG tablet Take 1 tablet (1 mg total) by mouth daily. Take 3 tablet  (3mg  total) by mouth at bed time 90 tablet 0  . traZODone (DESYREL) 50 MG tablet Take 1 tablet (50 mg total) by mouth at bedtime as needed for sleep. 30 tablet 0    Musculoskeletal: Strength & Muscle Tone: within normal limits Gait & Station: normal Patient leans: N/A  Psychiatric Specialty Exam: Physical Exam  Constitutional: He is oriented to person, place, and time. He appears well-developed and well-nourished.  HENT:  Head: Normocephalic.  Neck: Normal range of motion.  Respiratory: Effort normal.  Musculoskeletal: Normal range of motion.  Neurological: He is alert and oriented to person, place, and time.  Psychiatric: His speech is normal and behavior is normal. Judgment and thought content normal. Cognition and memory are normal. He exhibits a depressed mood.    Review of Systems  Psychiatric/Behavioral: Positive for depression and substance abuse.  All other systems reviewed and are negative.   Blood pressure 123/62, pulse (!) 53, temperature 97.7 F (36.5 C), temperature source Oral, resp. rate 18, SpO2 100 %.There is no height or weight on file to calculate BMI.  General Appearance: Casual  Eye Contact:  Good  Speech:  Normal Rate  Volume:  Normal  Mood:  Depressed, mild  Affect:  Congruent  Thought Process:  Coherent and Descriptions of Associations: Intact  Orientation:  Full (Time, Place, and Person)  Thought Content:  WDL and Logical  Suicidal Thoughts:  No  Homicidal Thoughts:  No  Memory:  Immediate;   Good Recent;   Good Remote;   Good  Judgement:  Fair  Insight:  Fair  Psychomotor Activity:  Normal   Concentration:  Concentration: Good and Attention Span: Good  Recall:  Good  Fund of Knowledge:  Fair  Language:  Good  Akathisia:  No  Handed:  Right  AIMS (if indicated):     Assets:  Leisure Time Physical Health Resilience Social Support  ADL's:  Intact  Cognition:  WNL  Sleep:        Treatment Plan Summary: Daily contact with patient to assess and evaluate symptoms and progress in treatment, Medication management and Plan bipolar affective disorder, depressed, mild:  -Crisis stabilization -Medication management:  Continue medical medications along with Trazodone 50 mg at bedtime PRN insomnia, Risperdal 2 mg BID for mood stabilization, Vistaril 25 mg every six hours PRN anxiety, and Cogentin 1 mg BID for EPS -Individual and substance abuse counseling  Disposition: No evidence of imminent risk to self or others at present.    Nanine MeansLORD, JAMISON, NP 09/14/2016 11:44 AM  Patient seen face-to-face for psychiatric evaluation, chart reviewed and case discussed with the physician extender and developed treatment plan. Reviewed the information documented and agree with the treatment plan. Thedore MinsMojeed Harlem Bula, MD

## 2016-09-16 ENCOUNTER — Ambulatory Visit: Payer: Self-pay | Admitting: Internal Medicine

## 2016-09-26 ENCOUNTER — Emergency Department (HOSPITAL_COMMUNITY): Payer: Self-pay

## 2016-09-26 ENCOUNTER — Emergency Department (HOSPITAL_COMMUNITY)
Admission: EM | Admit: 2016-09-26 | Discharge: 2016-09-26 | Disposition: A | Discharge status: Home / Self Care | Payer: Self-pay | Attending: Emergency Medicine | Admitting: Emergency Medicine

## 2016-09-26 ENCOUNTER — Encounter (HOSPITAL_COMMUNITY): Payer: Self-pay

## 2016-09-26 DIAGNOSIS — J219 Acute bronchiolitis, unspecified: Secondary | ICD-10-CM | POA: Insufficient documentation

## 2016-09-26 DIAGNOSIS — F909 Attention-deficit hyperactivity disorder, unspecified type: Secondary | ICD-10-CM | POA: Insufficient documentation

## 2016-09-26 DIAGNOSIS — Z79899 Other long term (current) drug therapy: Secondary | ICD-10-CM | POA: Insufficient documentation

## 2016-09-26 DIAGNOSIS — J209 Acute bronchitis, unspecified: Secondary | ICD-10-CM

## 2016-09-26 DIAGNOSIS — I10 Essential (primary) hypertension: Secondary | ICD-10-CM | POA: Insufficient documentation

## 2016-09-26 DIAGNOSIS — Z91010 Allergy to peanuts: Secondary | ICD-10-CM | POA: Insufficient documentation

## 2016-09-26 DIAGNOSIS — J45909 Unspecified asthma, uncomplicated: Secondary | ICD-10-CM | POA: Insufficient documentation

## 2016-09-26 DIAGNOSIS — F17210 Nicotine dependence, cigarettes, uncomplicated: Secondary | ICD-10-CM | POA: Insufficient documentation

## 2016-09-26 LAB — CBC
HEMATOCRIT: 42.6 % (ref 39.0–52.0)
HEMOGLOBIN: 14.6 g/dL (ref 13.0–17.0)
MCH: 29 pg (ref 26.0–34.0)
MCHC: 34.3 g/dL (ref 30.0–36.0)
MCV: 84.5 fL (ref 78.0–100.0)
Platelets: 230 10*3/uL (ref 150–400)
RBC: 5.04 MIL/uL (ref 4.22–5.81)
RDW: 13.6 % (ref 11.5–15.5)
WBC: 12.9 10*3/uL — AB (ref 4.0–10.5)

## 2016-09-26 LAB — BASIC METABOLIC PANEL
ANION GAP: 13 (ref 5–15)
BUN: 9 mg/dL (ref 6–20)
CHLORIDE: 102 mmol/L (ref 101–111)
CO2: 23 mmol/L (ref 22–32)
Calcium: 9.5 mg/dL (ref 8.9–10.3)
Creatinine, Ser: 0.9 mg/dL (ref 0.61–1.24)
GFR calc Af Amer: >60 mL/min (ref >60)
Glucose, Bld: 96 mg/dL (ref 65–99)
POTASSIUM: 3.4 mmol/L — AB (ref 3.5–5.1)
Sodium: 138 mmol/L (ref 135–145)

## 2016-09-26 LAB — TROPONIN I: Troponin I: <0.03 ng/mL (ref <0.03)

## 2016-09-26 MED ORDER — GUAIFENESIN-DM 100-10 MG/5ML PO SYRP
15.0000 mL | ORAL_SOLUTION | Freq: Once | ORAL | Status: AC
  Administered 2016-09-26: 15 mL via ORAL
  Filled 2016-09-26: qty 15

## 2016-09-26 MED ORDER — AEROCHAMBER PLUS W/MASK MISC
1.0000 | Freq: Once | Status: AC
  Administered 2016-09-26: 1
  Filled 2016-09-26: qty 1

## 2016-09-26 MED ORDER — IPRATROPIUM-ALBUTEROL 0.5-2.5 (3) MG/3ML IN SOLN
3.0000 mL | Freq: Once | RESPIRATORY_TRACT | Status: AC
  Administered 2016-09-26: 3 mL via RESPIRATORY_TRACT
  Filled 2016-09-26: qty 3

## 2016-09-26 MED ORDER — GUAIFENESIN-DM 100-10 MG/5ML PO SYRP: 5.0000 mL | ORAL_SOLUTION | ORAL | 0 refills | Status: DC | PRN

## 2016-09-26 MED ORDER — DEXAMETHASONE SODIUM PHOSPHATE 10 MG/ML IJ SOLN
10.0000 mg | Freq: Once | INTRAMUSCULAR | Status: AC
  Administered 2016-09-26: 10 mg via INTRAMUSCULAR
  Filled 2016-09-26: qty 1

## 2016-09-26 MED ORDER — ALBUTEROL SULFATE HFA 108 (90 BASE) MCG/ACT IN AERS
2.0000 | INHALATION_SPRAY | Freq: Once | RESPIRATORY_TRACT | Status: AC
  Administered 2016-09-26: 2 via RESPIRATORY_TRACT
  Filled 2016-09-26: qty 6.7

## 2016-09-26 NOTE — ED Provider Notes (Signed)
[facility]-EMERGENCY DEPT Provider Note   CSN: [id number] Arrival date & time: 09/26/16  0239     History   Chief Complaint Chief Complaint  Patient presents with  . Cough  . Chest Pain    HPI Edwin Martinez is a 30 y.o. male who presents to the ED with cc of cough. He has ah pmh of psychiatric illness, HIV, Asthma, and is a daily smoker of tobacco and cannabis. The patient has had 3 days of worsening cough and wheezing. He has not had an inhaler to use and has not taken any cough medicines. The patient states he had associated malaise and chills. He denies any fevers. Review of EMR shows his last CD4 count in the 2000s and his last quantitative, viral load about 100. Patient denies posttussive vomiting. He is compliant with his HIV medications.  HPI  Past Medical History:  Diagnosis Date  . ADHD (attention deficit hyperactivity disorder)   . ADHD (attention deficit hyperactivity disorder) 09/12/2012  . Anxiety   . Asthma   . Bipolar 1 disorder (HCC)   . Bipolar disorder (HCC)   . Epileptic seizures (HCC)   . HIV (human immunodeficiency virus infection) (HCC)   . Hypertension   . Schizophrenia (HCC)   . Seizures [facility](HCC)     Patient Active Problem List   Diagnosis Date Noted  . Cocaine use disorder, mild, abuse 07/27/2016  . Cannabis use disorder, moderate, dependence (HCC) 07/27/2016  . Tobacco use disorder 07/27/2016  . Intentional drug overdose (HCC) 07/18/2016  . Bipolar disorder, current episode depressed, mild (HCC) 05/04/2016  . Asthma 05/20/2007  . Human immunodeficiency virus (HIV) disease (HCC) 05/05/2007    Past Surgical History:  Procedure Laterality Date  . DENTAL SURGERY         Home Medications    Prior to Admission medications   Medication Sig Start Date End Date Taking? Authorizing Provider  abacavir-dolutegravir-lamiVUDine (TRIUMEQ) 600-50-300 MG tablet Take 1 tablet by mouth at bedtime.   Yes Historical Provider, MD  albuterol (PROVENTIL  HFA;VENTOLIN HFA) 108 (90 Base) MCG/ACT inhaler Inhale 2 puffs into the lungs every 6 (six) hours as needed for wheezing or shortness of breath. 07/29/16  Yes Jimmy FootmanAndrea Hernandez-Gonzalez, MD  benztropine (COGENTIN) 1 MG tablet Take 1 tablet (1 mg total) by mouth at bedtime. 08/22/16  Yes Oneta Rackanika N Lewis, NP  citalopram (CELEXA) 20 MG tablet Take 1 tablet (20 mg total) by mouth daily. 08/23/16  Yes Oneta Rackanika N Lewis, NP  hydrOXYzine (ATARAX/VISTARIL) 25 MG tablet Take 1 tablet (25 mg total) by mouth every 6 (six) hours as needed for anxiety. 08/22/16  Yes Oneta Rackanika N Lewis, NP  risperiDONE (RISPERDAL) 1 MG tablet Take 1 tablet (1 mg total) by mouth daily. Take 3 tablet  (3mg  total) by mouth at bed time 08/23/16  Yes Oneta Rackanika N Lewis, NP  traZODone (DESYREL) 50 MG tablet Take 1 tablet (50 mg total) by mouth at bedtime as needed for sleep. 08/22/16  Yes Oneta Rackanika N Lewis, NP  guaiFENesin-dextromethorphan (ROBITUSSIN DM) 100-10 MG/5ML syrup Take 5 mLs by mouth every 4 (four) hours as needed for cough. 09/26/16   Arthor CaptainAbigail Travontae Freiberger, PA-C    Family History Family History  Problem Relation Age of Onset  . Huntington's disease Father   . Heart disease Mother   . Suicidality Maternal Uncle   . Suicidality Maternal Grandmother     Social History Social History  Substance Use Topics  . Smoking status: Current Every Day Smoker  Packs/day: 0.50    Types: Cigarettes    Start date: 09/29/1991  . Smokeless tobacco: Never Used  . Alcohol use 1.2 oz/week    2 Standard drinks or equivalent per week     Comment: once week      Allergies   Magnesium-containing compounds; Peanut-containing drug products; Atripla [efavirenz-emtricitab-tenofovir]; Esomeprazole magnesium; Bactrim [sulfamethoxazole-trimethoprim]; and Penicillins   Review of Systems Review of Systems Ten systems reviewed and are negative for acute change, except as noted in the HPI.    Physical Exam Updated Vital Signs BP 133/70   Pulse 65   Temp  98.7 F (37.1 C) (Oral)   Resp 20   SpO2 96%   Physical Exam  Constitutional: He appears well-developed and well-nourished. No distress.  Unkempt Malodorous  HENT:  Head: Normocephalic and atraumatic.  Eyes: Conjunctivae and EOM are normal. Pupils are equal, round, and reactive to light. No scleral icterus.  Neck: Normal range of motion. Neck supple.  Cardiovascular: Normal rate, regular rhythm and normal heart sounds.   Pulmonary/Chest: Effort normal. No respiratory distress. He has wheezes.  Hacking, productive cough Mild expiratory wheeze diffusely  Abdominal: Soft. There is no tenderness.  Musculoskeletal: He exhibits no edema.  Neurological: He is alert.  Skin: Skin is warm and dry. He is not diaphoretic.  Psychiatric: His behavior is normal.  Nursing note and vitals reviewed.    ED Treatments / Results  Labs (all labs ordered are listed, but only abnormal results are displayed) Labs Reviewed  BASIC METABOLIC PANEL - Abnormal; Notable for the following:       Result Value   Potassium 3.4 (*)    All other components within normal limits  CBC - Abnormal; Notable for the following:    WBC 12.9 (*)    All other components within normal limits  TROPONIN I    EKG  EKG Interpretation None       Radiology Dg Chest 2 View  Result Date: 09/26/2016 CLINICAL DATA:  Cough and right-sided chest pain for 3 days. EXAM: CHEST  2 VIEW COMPARISON:  11/26/2015 FINDINGS: The cardiomediastinal contours are normal. Mild perihilar bronchial thickening. Pulmonary vasculature is normal. No consolidation, pleural effusion, or pneumothorax. No acute osseous abnormalities are seen. IMPRESSION: Mild perihilar bronchial thickening can be seen with asthma or acute bronchitis. Electronically Signed   By: Rubye OaksMelanie  Ehinger M.D.   On: 09/26/2016 03:44    Procedures Procedures (including critical care time)  Medications Ordered in ED Medications  guaiFENesin-dextromethorphan (ROBITUSSIN  DM) 100-10 MG/5ML syrup 15 mL (not administered)  albuterol (PROVENTIL HFA;VENTOLIN HFA) 108 (90 Base) MCG/ACT inhaler 2 puff (not administered)  aerochamber plus with mask device 1 each (not administered)  dexamethasone (DECADRON) injection 10 mg (10 mg Intramuscular Given 09/26/16 0630)  ipratropium-albuterol (DUONEB) 0.5-2.5 (3) MG/3ML nebulizer solution 3 mL (3 mLs Nebulization Given 09/26/16 [id number])     Initial Impression / Assessment and Plan / ED Course  I have reviewed the triage vital signs and the nursing notes.  Pertinent labs & imaging results that were available during my care of the patient were reviewed by me and considered in my medical decision making (see chart for details).  Clinical Course as of Sep 26 652  Sat Sep 26, 2016  0609 ED EKG within 10 minutes [AH]    Clinical Course User Index [AH] Arthor CaptainAbigail Kelechi Orgeron, PA-C    Pt CXR negative for acute infiltrate. Patients symptoms are consistent with URI, likely viral etiology. Discussed that antibiotics are not  indicated for viral infections. Pt will be discharged with symptomatic treatment.  Verbalizes understanding and is agreeable with plan. Pt is hemodynamically stable & in NAD prior to dc.   Final Clinical Impressions(s) / ED Diagnoses   Final diagnoses:  Bronchospasm with bronchitis, acute    New Prescriptions New Prescriptions   GUAIFENESIN-DEXTROMETHORPHAN (ROBITUSSIN DM) 100-10 MG/5ML SYRUP    Take 5 mLs by mouth every 4 (four) hours as needed for cough.     Arthor Captain, PA-C 09/26/16 9604    Melene Plan, DO 09/26/16 [phone]

## 2016-09-26 NOTE — Discharge Instructions (Signed)
Purchase cough medicine and use as directed. You may ask the pharmacist if it affects any of your current medications.  Take the inhaler 1-2 puffs every 4-6 hours. You appear to have an upper respiratory infection (URI). An upper respiratory tract infection, or cold, is a viral infection of the air passages leading to the lungs. It is contagious and can be spread to others, especially during the first 3 or 4 days. It cannot be cured by antibiotics or other medicines. RETURN IMMEDIATELY IF you develop shortness of breath, confusion or altered mental status, a new rash, become dizzy, faint, or poorly responsive, or are unable to be cared for at home.

## 2016-09-26 NOTE — ED Triage Notes (Signed)
Per EMS, pt from home with complaint of productive cough with tan sputum x 2 days and nonradiating left sided chest pain. Pt received 10mg  albuterol, 0.5 atrovent, and 125 solu-medrol with EMS. 12 lead showed NSR. VSS. Pt able to speak in complete sentences.

## 2016-09-29 ENCOUNTER — Encounter (HOSPITAL_COMMUNITY): Payer: Self-pay | Admitting: *Deleted

## 2016-09-29 ENCOUNTER — Emergency Department (HOSPITAL_COMMUNITY)
Admission: EM | Admit: 2016-09-29 | Discharge: 2016-09-29 | Disposition: A | Discharge status: Home / Self Care | Payer: Self-pay | Attending: Dermatology | Admitting: Dermatology

## 2016-09-29 ENCOUNTER — Encounter: Payer: Self-pay | Admitting: Pediatric Intensive Care

## 2016-09-29 DIAGNOSIS — Z79899 Other long term (current) drug therapy: Secondary | ICD-10-CM | POA: Insufficient documentation

## 2016-09-29 DIAGNOSIS — F17210 Nicotine dependence, cigarettes, uncomplicated: Secondary | ICD-10-CM | POA: Insufficient documentation

## 2016-09-29 DIAGNOSIS — J45909 Unspecified asthma, uncomplicated: Secondary | ICD-10-CM | POA: Insufficient documentation

## 2016-09-29 DIAGNOSIS — F909 Attention-deficit hyperactivity disorder, unspecified type: Secondary | ICD-10-CM | POA: Insufficient documentation

## 2016-09-29 DIAGNOSIS — I10 Essential (primary) hypertension: Secondary | ICD-10-CM | POA: Insufficient documentation

## 2016-09-29 DIAGNOSIS — R05 Cough: Secondary | ICD-10-CM | POA: Insufficient documentation

## 2016-09-29 DIAGNOSIS — Z5321 Procedure and treatment not carried out due to patient leaving prior to being seen by health care provider: Secondary | ICD-10-CM | POA: Insufficient documentation

## 2016-09-29 NOTE — Congregational Nurse Program (Signed)
Congregational Nurse Program Note  Date of Encounter: 09/29/2016  Past Medical History: Past Medical History:  Diagnosis Date  . ADHD (attention deficit hyperactivity disorder)   . ADHD (attention deficit hyperactivity disorder) 09/12/2012  . Anxiety   . Asthma   . Bipolar 1 disorder (HCC)   . Bipolar disorder (HCC)   . Epileptic seizures (HCC)   . HIV (human immunodeficiency virus infection) (HCC)   . Hypertension   . Schizophrenia (HCC)   . Seizures (HCC)     Encounter Details:     CNP Questionnaire - 09/29/16 0940      Patient Demographics   Is this a new or existing patient? New   Patient is considered a/an Not Applicable   Race African-American/Black     Patient Assistance   Location of Patient Assistance [place]   Patient's financial/insurance status Self-Pay (Uninsured)   Uninsured Patient ([employer], [employer]Card/Care Connects) Yes   Interventions Referred to ED/Urgent Care   Patient referred to apply for the following financial assistance Not Applicable   Food insecurities addressed Not Applicable   Transportation assistance Yes   Type of Assistance Bus Pass Given   Educational health offerings Not Applicable     Encounter Details   Primary purpose of visit Acute Illness/Condition Visit   Was an Emergency Department visit averted? No   Does patient have a medical provider? Yes   Patient referred to Emergency Department   Was a mental health screening completed? (GAINS tool) No   Does patient have dental issues? No   Does patient have vision issues? No   Does your patient have an abnormal blood pressure today? No   Since previous encounter, have you referred patient for abnormal blood pressure that resulted in a new diagnosis or medication change? No   Does your patient have an abnormal blood glucose today? No   Since previous encounter, have you referred patient for abnormal blood glucose that resulted in a new diagnosis or medication change? No   Was there a life-saving  intervention made? No     Client states that he was seen in ED last week for bronchitis. Has productive cough (green/yellow) but denies fever. Breath sounds- rhonchi bilaterally with expiratory wheezes noted. Client directed to return to ED for evaluation. Bus passes given. Client has access to medications through [facility] and will notify CN if he needs medication assistance. Will return to clinic on Friday for re-evaluation.

## 2016-09-29 NOTE — ED Triage Notes (Signed)
States he stays in the shelter  And was seen here 3 days ago and dx. With bronchitis. States he doesn't feel any better.

## 2016-09-29 NOTE — ED Notes (Signed)
Pt states, "I just cannot wait to be seen. I will come back later." pt encouraged to return for worsening symptoms

## 2016-09-30 ENCOUNTER — Encounter (HOSPITAL_COMMUNITY): Payer: Self-pay | Admitting: Emergency Medicine

## 2016-09-30 ENCOUNTER — Emergency Department (HOSPITAL_COMMUNITY)
Admission: EM | Admit: 2016-09-30 | Discharge: 2016-09-30 | Disposition: A | Discharge status: Home / Self Care | Payer: Self-pay | Attending: Emergency Medicine | Admitting: Emergency Medicine

## 2016-09-30 DIAGNOSIS — Z79899 Other long term (current) drug therapy: Secondary | ICD-10-CM | POA: Insufficient documentation

## 2016-09-30 DIAGNOSIS — J069 Acute upper respiratory infection, unspecified: Secondary | ICD-10-CM | POA: Insufficient documentation

## 2016-09-30 DIAGNOSIS — F909 Attention-deficit hyperactivity disorder, unspecified type: Secondary | ICD-10-CM | POA: Insufficient documentation

## 2016-09-30 DIAGNOSIS — Z5321 Procedure and treatment not carried out due to patient leaving prior to being seen by health care provider: Secondary | ICD-10-CM | POA: Insufficient documentation

## 2016-09-30 DIAGNOSIS — I10 Essential (primary) hypertension: Secondary | ICD-10-CM | POA: Insufficient documentation

## 2016-09-30 DIAGNOSIS — F17210 Nicotine dependence, cigarettes, uncomplicated: Secondary | ICD-10-CM | POA: Insufficient documentation

## 2016-09-30 DIAGNOSIS — J45909 Unspecified asthma, uncomplicated: Secondary | ICD-10-CM | POA: Insufficient documentation

## 2016-09-30 NOTE — ED Triage Notes (Addendum)
Pt presents to ED for assessment after being diagnosed with bronchitis on Sunday here.  [facility] and [facility] there listened to him and said he sounded "wet" and that he needed to come back to get checked out.  Pt denies any changes in his symptoms except "lots and lots of coughing".  Pt sts he was given a prescription for robitusson but has no means of getting the medicine.  Pt is also asking for a flu shot.  Pt is currently a 0.5 pack/day smoker

## 2016-10-19 ENCOUNTER — Other Ambulatory Visit: Payer: Self-pay | Admitting: *Deleted

## 2016-10-19 ENCOUNTER — Telehealth: Payer: Self-pay | Admitting: *Deleted

## 2016-10-19 MED ORDER — ABACAVIR-DOLUTEGRAVIR-LAMIVUD 600-50-300 MG PO TABS: 1.0000 | ORAL_TABLET | Freq: Every day | ORAL | 0 refills | Status: DC

## 2016-10-19 NOTE — Telephone Encounter (Signed)
Patient called for a refill on his hiv med; last seen 08/2015. Scheduled a lab appt for 11/09/16 and refilled Rx for one month until he gets in for a lab appt. Wendall MolaJacqueline Cockerham

## 2016-10-20 ENCOUNTER — Encounter (HOSPITAL_BASED_OUTPATIENT_CLINIC_OR_DEPARTMENT_OTHER): Payer: Self-pay | Admitting: Emergency Medicine

## 2016-10-20 ENCOUNTER — Emergency Department (HOSPITAL_BASED_OUTPATIENT_CLINIC_OR_DEPARTMENT_OTHER): Payer: Self-pay

## 2016-10-20 ENCOUNTER — Emergency Department (HOSPITAL_BASED_OUTPATIENT_CLINIC_OR_DEPARTMENT_OTHER)
Admission: EM | Admit: 2016-10-20 | Discharge: 2016-10-20 | Disposition: A | Discharge status: Home / Self Care | Payer: Self-pay | Attending: Emergency Medicine | Admitting: Emergency Medicine

## 2016-10-20 DIAGNOSIS — J45909 Unspecified asthma, uncomplicated: Secondary | ICD-10-CM | POA: Insufficient documentation

## 2016-10-20 DIAGNOSIS — F17210 Nicotine dependence, cigarettes, uncomplicated: Secondary | ICD-10-CM | POA: Insufficient documentation

## 2016-10-20 DIAGNOSIS — R197 Diarrhea, unspecified: Secondary | ICD-10-CM | POA: Insufficient documentation

## 2016-10-20 DIAGNOSIS — Z79899 Other long term (current) drug therapy: Secondary | ICD-10-CM | POA: Insufficient documentation

## 2016-10-20 DIAGNOSIS — F909 Attention-deficit hyperactivity disorder, unspecified type: Secondary | ICD-10-CM | POA: Insufficient documentation

## 2016-10-20 DIAGNOSIS — Z21 Asymptomatic human immunodeficiency virus [HIV] infection status: Secondary | ICD-10-CM | POA: Insufficient documentation

## 2016-10-20 DIAGNOSIS — I10 Essential (primary) hypertension: Secondary | ICD-10-CM | POA: Insufficient documentation

## 2016-10-20 DIAGNOSIS — R05 Cough: Secondary | ICD-10-CM

## 2016-10-20 DIAGNOSIS — J069 Acute upper respiratory infection, unspecified: Secondary | ICD-10-CM | POA: Insufficient documentation

## 2016-10-20 DIAGNOSIS — R059 Cough, unspecified: Secondary | ICD-10-CM

## 2016-10-20 MED ORDER — DOXYCYCLINE HYCLATE 100 MG PO TABS
100.0000 mg | ORAL_TABLET | Freq: Once | ORAL | Status: AC
  Administered 2016-10-20: 100 mg via ORAL
  Filled 2016-10-20: qty 1

## 2016-10-20 MED ORDER — IPRATROPIUM-ALBUTEROL 0.5-2.5 (3) MG/3ML IN SOLN
3.0000 mL | Freq: Once | RESPIRATORY_TRACT | Status: AC
  Administered 2016-10-20: 3 mL via RESPIRATORY_TRACT
  Filled 2016-10-20: qty 3

## 2016-10-20 MED ORDER — BENZONATATE 100 MG PO CAPS
100.0000 mg | ORAL_CAPSULE | Freq: Once | ORAL | Status: AC
  Administered 2016-10-20: 100 mg via ORAL
  Filled 2016-10-20: qty 1

## 2016-10-20 NOTE — ED Triage Notes (Signed)
Pt c/o nasal congestion, cp cough, diarrhea x 1wk; dry heaves, and "chest hurts." EKG en route by EMS and reviewed by MD per EMS.

## 2016-10-20 NOTE — ED Provider Notes (Signed)
[facility]Y [facility]PT [facility] Provider Note   CSN: [id number] Arrival date & time: 10/20/16  4098     History   Chief Complaint Chief Complaint  Edwin Martinez presents with  . Nasal Congestion    HPI Edwin Martinez is a 31 y.o. male.  The history is provided by the Edwin Martinez and medical records. No language interpreter was used.    Edwin Martinez is a 31 y.o. male  with a PMH of HIV not on medication, asthma, and bipolar disorder who presents to the Emergency Department complaining of  Persistent dry cough, congestion, sinus pressure and nonbloody diarrhea 3 weeks. Seen at [facility] ER at onset of symptoms and given Rx for cough medication, however Edwin Martinez did not get medication filled. No meds taken prior to arrival for symptoms. Edwin Martinez notes cough is worse at night causing him some difficulty breathing, but no difficulty breathing during the day. No documented fevers, abdominal pain or emesis. Edwin Martinez is a half pack per day smoker.  Past Medical History:  Diagnosis Date  . ADHD (attention deficit hyperactivity disorder)   . ADHD (attention deficit hyperactivity disorder) 09/12/2012  . Anxiety   . Asthma   . Bipolar 1 disorder (HCC)   . Bipolar disorder (HCC)   . Epileptic seizures (HCC)   . HIV (human immunodeficiency virus infection) (HCC)   . Hypertension   . Schizophrenia (HCC)   . Seizures [facility])     Edwin Martinez Active Problem List   Diagnosis Date Noted  . Cocaine use disorder, mild, abuse 07/27/2016  . Cannabis use disorder, moderate, dependence (HCC) 07/27/2016  . Tobacco use disorder 07/27/2016  . Intentional drug overdose (HCC) 07/18/2016  . Bipolar disorder, current episode depressed, mild (HCC) 05/04/2016  . Asthma 05/20/2007  . Human immunodeficiency virus (HIV) disease (HCC) 05/05/2007    Past Surgical History:  Procedure Laterality Date  . DENTAL SURGERY         Home Medications    Prior to Admission medications   Medication Sig Start Date End Date Taking?  Authorizing Provider  abacavir-dolutegravir-lamiVUDine (TRIUMEQ) 600-50-300 MG tablet Take 1 tablet by mouth at bedtime. 10/19/16   Judyann Munson, MD  albuterol (PROVENTIL HFA;VENTOLIN HFA) 108 (90 Base) MCG/ACT inhaler Inhale 2 puffs into the lungs every 6 (six) hours as needed for wheezing or shortness of breath. 07/29/16   Jimmy Footman, MD  benztropine (COGENTIN) 1 MG tablet Take 1 tablet (1 mg total) by mouth at bedtime. 08/22/16   Oneta Rack, NP  citalopram (CELEXA) 20 MG tablet Take 1 tablet (20 mg total) by mouth daily. 08/23/16   Oneta Rack, NP  guaiFENesin-dextromethorphan (ROBITUSSIN DM) 100-10 MG/5ML syrup Take 5 mLs by mouth every 4 (four) hours as needed for cough. 09/26/16   Arthor Captain, PA-C  hydrOXYzine (ATARAX/VISTARIL) 25 MG tablet Take 1 tablet (25 mg total) by mouth every 6 (six) hours as needed for anxiety. 08/22/16   Oneta Rack, NP  risperiDONE (RISPERDAL) 1 MG tablet Take 1 tablet (1 mg total) by mouth daily. Take 3 tablet  (3mg  total) by mouth at bed time 08/23/16   Oneta Rack, NP  traZODone (DESYREL) 50 MG tablet Take 1 tablet (50 mg total) by mouth at bedtime as needed for sleep. 08/22/16   Oneta Rack, NP    Family History Family History  Problem Relation Age of Onset  . Huntington's disease Father   . Heart disease Mother   . Suicidality Maternal Uncle   . Suicidality Maternal  Grandmother     Social History Social History  Substance Use Topics  . Smoking status: Current Every Day Smoker    Packs/day: 0.50    Types: Cigarettes    Start date: 09/29/1991  . Smokeless tobacco: Never Used  . Alcohol use 1.2 oz/week    2 Standard drinks or equivalent per week     Comment: once week      Allergies   Magnesium-containing compounds; Peanut-containing drug products; Atripla [efavirenz-emtricitab-tenofovir]; Esomeprazole magnesium; Bactrim [sulfamethoxazole-trimethoprim]; and Penicillins   Review of Systems Review of Systems    Constitutional: Negative for chills and fever.  HENT: Positive for congestion, sinus pain and sinus pressure.   Eyes: Negative for visual disturbance.  Respiratory: Positive for cough.   Cardiovascular: Negative for chest pain.  Gastrointestinal: Positive for diarrhea. Negative for abdominal pain, blood in stool, nausea and vomiting.  Musculoskeletal: Negative for back pain and neck pain.  Skin: Negative for rash.  Allergic/Immunologic: Positive for immunocompromised state.  Neurological: Negative for headaches.     Physical Exam Updated Vital Signs BP 134/84   Pulse 65   Temp 98.3 F (36.8 C) (Oral)   Resp 20   Ht 5\' 7"  (1.702 m)   Wt 72.6 kg   SpO2 100%   BMI 25.06 kg/m   Physical Exam  Constitutional: Edwin Martinez is oriented to person, place, and time. Edwin Martinez appears well-developed and well-nourished. No distress.  HENT:  Head: Normocephalic and atraumatic.  OP with erythema, no exudates or tonsillar hypertrophy. + nasal congestion with mucosal edema. + Maxillary sinus tenderness.  Neck: Normal range of motion. Neck supple.  No meningeal signs.   Cardiovascular: Normal rate, regular rhythm and normal heart sounds.   Pulmonary/Chest: Effort normal. No respiratory distress. Edwin Martinez has wheezes (Expiratory wheezing bilaterally). Edwin Martinez has no rales. Edwin Martinez exhibits no tenderness.  Abdominal: Soft. Edwin Martinez exhibits no distension. There is no tenderness.  Musculoskeletal: Normal range of motion.  Neurological: Edwin Martinez is alert and oriented to person, place, and time.  Skin: Skin is warm and dry. Edwin Martinez is not diaphoretic.  Nursing note and vitals reviewed.    ED Treatments / Results  Labs (all labs ordered are listed, but only abnormal results are displayed) Labs Reviewed - No data to display  EKG  EKG Interpretation None       Radiology Dg Chest 2 View  Result Date: 10/20/2016 CLINICAL DATA:  Cough, chest congestion, fever, and fatigue for the past 3-4 weeks. Current smoker. History of asthma,  HIV, substance abuse. EXAM: CHEST  2 VIEW COMPARISON:  PA and lateral chest x-ray of September 26, 2016 FINDINGS: The lungs are well-expanded. There is no focal infiltrate. There is no pleural effusion or pneumothorax. The heart and pulmonary vascularity are normal. The mediastinum is normal in width. The trachea is midline. The bony thorax is unremarkable. IMPRESSION: Mild chronic bronchitic -asthmatic changes, stable. There is no acute cardiopulmonary abnormality. Electronically Signed   By: David  SwazilandJordan M.D.   On: 10/20/2016 09:47    Procedures Procedures (including critical care time)  Medications Ordered in ED Medications  benzonatate (TESSALON) capsule 100 mg (not administered)  doxycycline (VIBRA-TABS) tablet 100 mg (not administered)  ipratropium-albuterol (DUONEB) 0.5-2.5 (3) MG/3ML nebulizer solution 3 mL (3 mLs Nebulization Given 10/20/16 0946)     Initial Impression / Assessment and Plan / ED Course  I have reviewed the triage vital signs and the nursing notes.  Pertinent labs & imaging results that were available during my care of the Edwin Martinez were  reviewed by me and considered in my medical decision making (see chart for details).    KELAN PRITT is a 31 y.o. male who presents to ED for cough, congestion and sinus pressure x 3 weeks. Seen at [facility] ER at onset of symptoms and chart reviewed from encounter. On exam, Edwin Martinez is afebrile with faint expiratory wheezing-DuoNeb given. CXR negative for acute findings. Edwin Martinez also exhibits focal maxillary sinus tenderness bilaterally. Given Edwin Martinez's immunocompromised state, duration of symptoms and focal areas of tenderness will treat sinusitis. Edwin Martinez strongly encouraged to follow up with infectious disease. Reasons to return to ER were discussed and all questions answered.  Final Clinical Impressions(s) / ED Diagnoses   Final diagnoses:  Cough  Upper respiratory tract infection, unspecified type    New Prescriptions New  Prescriptions   No medications on file     [facility] Jennel Mara, PA-C 10/20/16 1039    Alvira Monday, MD 10/27/16 [phone]

## 2016-10-20 NOTE — Discharge Instructions (Signed)
It was my pleasure taking care of you today!  Please take all of your antibiotics until finished! Increase hydration.  Follow up with your primary care provider if symptoms do not improve in 10 days.  Return to ER for new or worsening symptoms, any additional concerns.

## 2016-10-20 NOTE — ED Notes (Signed)
Pt received approx 1L NS en route to ED.

## 2016-10-23 ENCOUNTER — Telehealth (HOSPITAL_BASED_OUTPATIENT_CLINIC_OR_DEPARTMENT_OTHER): Payer: Self-pay | Admitting: *Deleted

## 2016-10-23 MED FILL — DOXYCYCLINE HYCLATE 100 MG: 100 | 7 days supply | Qty: 14 | Fill #0

## 2016-10-23 NOTE — Telephone Encounter (Signed)
Pt called regarding visit on 10/20/16. States he was told he would be given Rx for doxycycline but only received a coupon to help offset cost. Chart reviewed by Dr. Particia NearingHaviland who gave VORB for doxycycline 100mg  BID x 7 days; dispense 14, no refills. Returned call to pt at [phone] [facility] and he had me speak with Shann MedalVictoria Hussey, Charity fundraiserN (Engineer, technical salescongregational RN for [employer]). She stated to [facility] and let them know she would pick up Rx this afternoon. Rx called in to Lupita [facility]Donna at [facility] as requested.

## 2016-11-08 ENCOUNTER — Encounter (HOSPITAL_COMMUNITY): Payer: Self-pay | Admitting: Emergency Medicine

## 2016-11-08 DIAGNOSIS — F909 Attention-deficit hyperactivity disorder, unspecified type: Secondary | ICD-10-CM | POA: Insufficient documentation

## 2016-11-08 DIAGNOSIS — Z79899 Other long term (current) drug therapy: Secondary | ICD-10-CM | POA: Insufficient documentation

## 2016-11-08 DIAGNOSIS — Z91010 Allergy to peanuts: Secondary | ICD-10-CM | POA: Insufficient documentation

## 2016-11-08 DIAGNOSIS — F329 Major depressive disorder, single episode, unspecified: Secondary | ICD-10-CM | POA: Insufficient documentation

## 2016-11-08 DIAGNOSIS — I10 Essential (primary) hypertension: Secondary | ICD-10-CM | POA: Insufficient documentation

## 2016-11-08 DIAGNOSIS — F17210 Nicotine dependence, cigarettes, uncomplicated: Secondary | ICD-10-CM | POA: Insufficient documentation

## 2016-11-08 DIAGNOSIS — J45909 Unspecified asthma, uncomplicated: Secondary | ICD-10-CM | POA: Insufficient documentation

## 2016-11-08 LAB — COMPREHENSIVE METABOLIC PANEL
ALT: 23 U/L (ref 17–63)
ANION GAP: 8 (ref 5–15)
AST: 28 U/L (ref 15–41)
Albumin: 4 g/dL (ref 3.5–5.0)
Alkaline Phosphatase: 79 U/L (ref 38–126)
BUN: 13 mg/dL (ref 6–20)
CALCIUM: 9.5 mg/dL (ref 8.9–10.3)
CHLORIDE: 109 mmol/L (ref 101–111)
CO2: 22 mmol/L (ref 22–32)
Creatinine, Ser: 1.12 mg/dL (ref 0.61–1.24)
GFR calc non Af Amer: >60 mL/min (ref >60)
Glucose, Bld: 108 mg/dL — ABNORMAL HIGH (ref 65–99)
Potassium: 3.3 mmol/L — ABNORMAL LOW (ref 3.5–5.1)
SODIUM: 139 mmol/L (ref 135–145)
Total Bilirubin: 0.6 mg/dL (ref 0.3–1.2)
Total Protein: 6.9 g/dL (ref 6.5–8.1)

## 2016-11-08 LAB — SALICYLATE LEVEL

## 2016-11-08 LAB — CBC
HCT: 40.7 % (ref 39.0–52.0)
HEMOGLOBIN: 13.5 g/dL (ref 13.0–17.0)
MCH: 28.2 pg (ref 26.0–34.0)
MCHC: 33.2 g/dL (ref 30.0–36.0)
MCV: 85 fL (ref 78.0–100.0)
Platelets: 258 10*3/uL (ref 150–400)
RBC: 4.79 MIL/uL (ref 4.22–5.81)
RDW: 13.9 % (ref 11.5–15.5)
WBC: 11.6 10*3/uL — ABNORMAL HIGH (ref 4.0–10.5)

## 2016-11-08 LAB — ACETAMINOPHEN LEVEL

## 2016-11-08 LAB — ETHANOL: Alcohol, Ethyl (B): <5 mg/dL (ref <5)

## 2016-11-08 NOTE — ED Triage Notes (Addendum)
Pt states his psych meds were [facility] weeks ago and he has felt like his "mind isn't right" since then.  C/o suicidal ideation with a plan to jump in front of a transfer truck.  Pt calm and verbalized understanding of medical clearance protocol.

## 2016-11-09 ENCOUNTER — Other Ambulatory Visit: Payer: Self-pay

## 2016-11-09 ENCOUNTER — Emergency Department (HOSPITAL_COMMUNITY)
Admission: EM | Admit: 2016-11-09 | Discharge: 2016-11-11 | Disposition: A | Discharge status: Home / Self Care | Payer: Federal, State, Local not specified - Other | Attending: Emergency Medicine | Admitting: Emergency Medicine

## 2016-11-09 DIAGNOSIS — F1410 Cocaine abuse, uncomplicated: Secondary | ICD-10-CM | POA: Diagnosis present

## 2016-11-09 DIAGNOSIS — F3131 Bipolar disorder, current episode depressed, mild: Secondary | ICD-10-CM | POA: Diagnosis present

## 2016-11-09 DIAGNOSIS — R45851 Suicidal ideations: Secondary | ICD-10-CM

## 2016-11-09 LAB — RAPID URINE DRUG SCREEN, HOSP PERFORMED
Amphetamines: NOT DETECTED
BENZODIAZEPINES: NOT DETECTED
Barbiturates: NOT DETECTED
Cocaine: POSITIVE — AB
Opiates: NOT DETECTED
Tetrahydrocannabinol: POSITIVE — AB

## 2016-11-09 MED ORDER — ABACAVIR-DOLUTEGRAVIR-LAMIVUD 600-50-300 MG PO TABS
1.0000 | ORAL_TABLET | Freq: Every day | ORAL | Status: DC
  Administered 2016-11-09 – 2016-11-10 (×2): 1 via ORAL
  Filled 2016-11-09 (×3): qty 1

## 2016-11-09 MED ORDER — BENZTROPINE MESYLATE 1 MG PO TABS
1.0000 mg | ORAL_TABLET | Freq: Every day | ORAL | Status: DC
  Administered 2016-11-09 – 2016-11-10 (×2): 1 mg via ORAL
  Filled 2016-11-09 (×2): qty 1

## 2016-11-09 MED ORDER — RISPERIDONE 3 MG PO TABS
3.0000 mg | ORAL_TABLET | Freq: Every day | ORAL | Status: DC
  Administered 2016-11-09 – 2016-11-10 (×2): 3 mg via ORAL
  Filled 2016-11-09: qty 3
  Filled 2016-11-09: qty 1

## 2016-11-09 MED ORDER — TRAZODONE HCL 50 MG PO TABS
50.0000 mg | ORAL_TABLET | Freq: Every evening | ORAL | Status: DC | PRN
  Administered 2016-11-09: 50 mg via ORAL
  Filled 2016-11-09: qty 1

## 2016-11-09 MED ORDER — POTASSIUM CHLORIDE CRYS ER 20 MEQ PO TBCR: 20.0000 meq | EXTENDED_RELEASE_TABLET | Freq: Once | ORAL | Status: DC

## 2016-11-09 MED ORDER — CITALOPRAM HYDROBROMIDE 10 MG PO TABS
20.0000 mg | ORAL_TABLET | Freq: Every day | ORAL | Status: DC
  Administered 2016-11-09 – 2016-11-11 (×3): 20 mg via ORAL
  Filled 2016-11-09 (×3): qty 2

## 2016-11-09 MED ORDER — HYDROXYZINE HCL 25 MG PO TABS: 25.0000 mg | ORAL_TABLET | Freq: Four times a day (QID) | ORAL | Status: DC | PRN

## 2016-11-09 NOTE — ED Notes (Signed)
Patient eating lunch.
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
## 2016-11-09 NOTE — ED Notes (Signed)
Dinner order placed by Angela, NS/MT. 

## 2016-11-09 NOTE — ED Provider Notes (Signed)
[facility]-EMERGENCY DEPT Provider Note   CSN: [id number] Arrival date & time: 11/08/16  2211     History   Chief Complaint Chief Complaint  Patient presents with  . Suicidal    HPI Edwin Martinez is a 31 y.o. male.  The history is provided by the patient and medical records. No language interpreter was used.   Edwin Martinez is a 31 y.o. male  with a PMH of HIV, bipolar disorder, asthma, anxiety, seizures and polysubstance abuse who presents to the Emergency Department complaining of suicidal ideations x 3 days. Endorses plan to lay down behind a car that is about to leave and let them run over him. He also thought about jumping in front of a truck. He states that he was started on celexa, vistaril and one other medication 2 weeks ago. He feels as if these medications are making his "mind not right". Denies HI and visual hallucinations. States he has had auditory hallucinations in the past, but none in the last few weeks.    Past Medical History:  Diagnosis Date  . ADHD (attention deficit hyperactivity disorder)   . ADHD (attention deficit hyperactivity disorder) 09/12/2012  . Anxiety   . Asthma   . Bipolar 1 disorder (HCC)   . Bipolar disorder (HCC)   . Epileptic seizures (HCC)   . HIV (human immunodeficiency virus infection) (HCC)   . Hypertension   . Schizophrenia (HCC)   . Seizures [facility])     Patient Active Problem List   Diagnosis Date Noted  . Cocaine use disorder, mild, abuse 07/27/2016  . Cannabis use disorder, moderate, dependence (HCC) 07/27/2016  . Tobacco use disorder 07/27/2016  . Intentional drug overdose (HCC) 07/18/2016  . Bipolar disorder, current episode depressed, mild (HCC) 05/04/2016  . Asthma 05/20/2007  . Human immunodeficiency virus (HIV) disease (HCC) 05/05/2007    Past Surgical History:  Procedure Laterality Date  . DENTAL SURGERY         Home Medications    Prior to Admission medications   Medication Sig Start Date End Date Taking?  Authorizing Provider  abacavir-dolutegravir-lamiVUDine (TRIUMEQ) 600-50-300 MG tablet Take 1 tablet by mouth at bedtime. 10/19/16  Yes Judyann Munson, MD  albuterol (PROVENTIL HFA;VENTOLIN HFA) 108 (90 Base) MCG/ACT inhaler Inhale 2 puffs into the lungs every 6 (six) hours as needed for wheezing or shortness of breath. 07/29/16  Yes Jimmy Footman, MD  benztropine (COGENTIN) 1 MG tablet Take 1 tablet (1 mg total) by mouth at bedtime. 08/22/16  Yes Oneta Rack, NP  citalopram (CELEXA) 20 MG tablet Take 1 tablet (20 mg total) by mouth daily. 08/23/16  Yes Oneta Rack, NP  hydrOXYzine (ATARAX/VISTARIL) 25 MG tablet Take 1 tablet (25 mg total) by mouth every 6 (six) hours as needed for anxiety. 08/22/16  Yes Oneta Rack, NP  risperiDONE (RISPERDAL) 1 MG tablet Take 1 tablet (1 mg total) by mouth daily. Take 3 tablet  (3mg  total) by mouth at bed time Patient taking differently: Take 3 mg by mouth at bedtime.  08/23/16  Yes Oneta Rack, NP  traZODone (DESYREL) 50 MG tablet Take 1 tablet (50 mg total) by mouth at bedtime as needed for sleep. 08/22/16  Yes Oneta Rack, NP  guaiFENesin-dextromethorphan (ROBITUSSIN DM) 100-10 MG/5ML syrup Take 5 mLs by mouth every 4 (four) hours as needed for cough. Patient not taking: Reported on 11/09/2016 09/26/16   Arthor Captain, PA-C    Family History Family History  Problem Relation Age  of Onset  . Huntington's disease Father   . Heart disease Mother   . Suicidality Maternal Uncle   . Suicidality Maternal Grandmother     Social History Social History  Substance Use Topics  . Smoking status: Current Every Day Smoker    Packs/day: 0.50    Types: Cigarettes    Start date: 09/29/1991  . Smokeless tobacco: Never Used  . Alcohol use 1.2 oz/week    2 Standard drinks or equivalent per week     Comment: once week      Allergies   Magnesium-containing compounds; Peanut-containing drug products; Atripla [efavirenz-emtricitab-tenofovir];  Esomeprazole magnesium; Bactrim [sulfamethoxazole-trimethoprim]; and Penicillins   Review of Systems Review of Systems  Constitutional: Negative for chills and fever.  HENT: Negative for congestion.   Eyes: Negative for visual disturbance.  Respiratory: Negative for cough and shortness of breath.   Cardiovascular: Negative for chest pain.  Gastrointestinal: Negative for abdominal pain, nausea and vomiting.  Musculoskeletal: Negative for back pain.  Skin: Negative for wound.  Neurological: Negative for headaches.  Psychiatric/Behavioral: Positive for suicidal ideas. Negative for self-injury.     Physical Exam Updated Vital Signs BP 125/95 (BP Location: Right Arm)   Pulse 68   Temp 98.4 F (36.9 C) (Oral)   Resp 17   SpO2 99%   Physical Exam  Constitutional: He is oriented to person, place, and time. He appears well-developed and well-nourished. No distress.  HENT:  Head: Normocephalic and atraumatic.  Cardiovascular: Normal rate, regular rhythm and normal heart sounds.   No murmur heard. Pulmonary/Chest: Effort normal and breath sounds normal. No respiratory distress.  Abdominal: Soft. He exhibits no distension. There is no tenderness.  Musculoskeletal: Normal range of motion.  Neurological: He is alert and oriented to person, place, and time.  Skin: Skin is warm and dry.  Nursing note and vitals reviewed.    ED Treatments / Results  Labs (all labs ordered are listed, but only abnormal results are displayed) Labs Reviewed  COMPREHENSIVE METABOLIC PANEL - Abnormal; Notable for the following:       Result Value   Potassium 3.3 (*)    Glucose, Bld 108 (*)    All other components within normal limits  ACETAMINOPHEN LEVEL - Abnormal; Notable for the following:    Acetaminophen (Tylenol), Serum <10 (*)    All other components within normal limits  CBC - Abnormal; Notable for the following:    WBC 11.6 (*)    All other components within normal limits  RAPID URINE DRUG  SCREEN, HOSP PERFORMED - Abnormal; Notable for the following:    Cocaine POSITIVE (*)    Tetrahydrocannabinol POSITIVE (*)    All other components within normal limits  ETHANOL  SALICYLATE LEVEL    EKG  EKG Interpretation None       Radiology No results found.  Procedures Procedures (including critical care time)  Medications Ordered in ED Medications  potassium chloride SA (K-DUR,KLOR-CON) CR tablet 20 mEq (not administered)  abacavir-dolutegravir-lamiVUDine (TRIUMEQ) 600-50-300 MG per tablet 1 tablet (not administered)  benztropine (COGENTIN) tablet 1 mg (not administered)  citalopram (CELEXA) tablet 20 mg (not administered)  hydrOXYzine (ATARAX/VISTARIL) tablet 25 mg (not administered)  risperiDONE (RISPERDAL) tablet 3 mg (not administered)  traZODone (DESYREL) tablet 50 mg (not administered)     Initial Impression / Assessment and Plan / ED Course  I have reviewed the triage vital signs and the nursing notes.  Pertinent labs & imaging results that were available during my care of  the patient were reviewed by me and considered in my medical decision making (see chart for details).    [name] A Balan is a 31 y.o. male who presents to ED for suicidal ideations with plan x 3 days. Recent psych med changes 2 weeks ago which he believes is contributory. Patient is voluntary and requesting help. I do not believe he will try to leave, but if so, likely will need IVC. Labs reviewed and reassuring. K+ of 3.3, replenished in ED. UDS + for cocaine and THC. Disposition per TTS recommendations.    Final Clinical Impressions(s) / ED Diagnoses   Final diagnoses:  None    New Prescriptions New Prescriptions   No medications on file     [facility]bourJaime Pilcher Gianna Calef, PA-C 11/09/16 [id number]    Shon Batonourtney F Horton, MD 11/09/16 [id number]

## 2016-11-09 NOTE — ED Notes (Addendum)
PT breakfast tray at bedside 

## 2016-11-09 NOTE — ED Notes (Signed)
Pt states he is suicidal and has been for a couple of days.

## 2016-11-09 NOTE — Progress Notes (Signed)
11/09/16  CSW [facility] pt. [facility]: [facility], [facility], [facility], [facility], [address], [place] [facility], [facility], [facility], [facility], [facility] for placement.  Timmothy EulerJean T. Kaylyn LimSutter, MSW, LCSWA Clinical Social Work Disposition [phone]

## 2016-11-10 DIAGNOSIS — F3131 Bipolar disorder, current episode depressed, mild: Secondary | ICD-10-CM | POA: Diagnosis not present

## 2016-11-10 DIAGNOSIS — Z818 Family history of other mental and behavioral disorders: Secondary | ICD-10-CM

## 2016-11-10 DIAGNOSIS — F1220 Cannabis dependence, uncomplicated: Secondary | ICD-10-CM

## 2016-11-10 DIAGNOSIS — Z880 Allergy status to penicillin: Secondary | ICD-10-CM | POA: Diagnosis not present

## 2016-11-10 DIAGNOSIS — Z888 Allergy status to other drugs, medicaments and biological substances status: Secondary | ICD-10-CM

## 2016-11-10 DIAGNOSIS — F17210 Nicotine dependence, cigarettes, uncomplicated: Secondary | ICD-10-CM | POA: Diagnosis not present

## 2016-11-10 DIAGNOSIS — Z79899 Other long term (current) drug therapy: Secondary | ICD-10-CM

## 2016-11-10 DIAGNOSIS — F1410 Cocaine abuse, uncomplicated: Secondary | ICD-10-CM | POA: Diagnosis not present

## 2016-11-10 MED ORDER — GABAPENTIN 300 MG PO CAPS
300.0000 mg | ORAL_CAPSULE | Freq: Three times a day (TID) | ORAL | Status: DC
  Administered 2016-11-10 – 2016-11-11 (×2): 300 mg via ORAL
  Filled 2016-11-10 (×2): qty 1
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
## 2016-11-10 NOTE — ED Provider Notes (Signed)
Discussed with Renata Capriceonrad.  Plan is to restart him on neurontin 300 mg TID and reassess in the AM   Edwin DibblesJon Arnet Hofferber, MD 11/10/16 1950

## 2016-11-10 NOTE — ED Notes (Signed)
Sitting up in bed watching tv.  

## 2016-11-10 NOTE — Progress Notes (Signed)
Pt has been evaluated via telepsych today. Per Psych NP, pt reported that he felt medications he was taking when [facility] were effective but he could not maintain them once he [facility] due to financial concerns. NP requested [facility] be contacted to investigate which medications are options for sponsorship.  Contacted [name] [phone]. Was informed that pt has yet to be seen for a medication appointment (had intake assessment in January and missed therapy appointment in February, is awaiting medication appt). [facility] states they would work with pt to find financial assistance for his medications in order to continue what was prescribed during hospital stay.   Ilean SkillMeghan Corbett Moulder, MSW, LCSW Clinical Social Work, Disposition  11/10/2016 [phone]

## 2016-11-10 NOTE — ED Notes (Signed)
Lying on bed watching tv. 

## 2016-11-10 NOTE — ED Notes (Signed)
A drink was given to patient during snack time, and a regular dinner was ordered for patient.

## 2016-11-10 NOTE — ED Notes (Signed)
[facility] x 1 labeled belongings bag moved from container to cabinet.

## 2016-11-10 NOTE — ED Notes (Signed)
Patient was given a snack and drink, and A regular diet was ordered for Lunch. 

## 2016-11-10 NOTE — ED Notes (Signed)
Pt on phone w/Conrad, NP, [facility].

## 2016-11-10 NOTE — ED Notes (Signed)
Pt lying on bed watching tv. 

## 2016-11-11 DIAGNOSIS — F17210 Nicotine dependence, cigarettes, uncomplicated: Secondary | ICD-10-CM | POA: Diagnosis not present

## 2016-11-11 DIAGNOSIS — Z880 Allergy status to penicillin: Secondary | ICD-10-CM | POA: Diagnosis not present

## 2016-11-11 DIAGNOSIS — Z91010 Allergy to peanuts: Secondary | ICD-10-CM

## 2016-11-11 DIAGNOSIS — R45851 Suicidal ideations: Secondary | ICD-10-CM

## 2016-11-11 DIAGNOSIS — Z888 Allergy status to other drugs, medicaments and biological substances status: Secondary | ICD-10-CM | POA: Diagnosis not present

## 2016-11-11 DIAGNOSIS — F3131 Bipolar disorder, current episode depressed, mild: Secondary | ICD-10-CM | POA: Diagnosis not present

## 2016-11-11 MED ORDER — GABAPENTIN 100 MG PO CAPS: 100.0000 mg | ORAL_CAPSULE | Freq: Three times a day (TID) | ORAL | 0 refills | Status: DC

## 2016-11-11 MED ORDER — BUSPIRONE HCL 5 MG PO TABS: 5.0000 mg | ORAL_TABLET | Freq: Three times a day (TID) | ORAL | 0 refills | Status: DC

## 2016-11-11 MED ORDER — BUSPIRONE HCL 10 MG PO TABS: 10.0000 mg | ORAL_TABLET | Freq: Two times a day (BID) | ORAL | 0 refills | Status: DC

## 2016-11-11 MED ORDER — GABAPENTIN 300 MG PO CAPS: 300.0000 mg | ORAL_CAPSULE | Freq: Three times a day (TID) | ORAL | 0 refills | Status: DC

## 2016-11-11 NOTE — ED Provider Notes (Signed)
He has been evaluated several times by TTS. At this point they feel that the patient should be discharged, to [facility].  At this time; the patient is requesting prescriptions for gabapentin and BuSpar. He is emphatic that he will not attempt suicide using these medications. He states that [facility] will [facility] and have them give him help with medication assessment and prescriptions.   Mancel BaleElliott Stepheni Cameron, MD 11/11/16 1515
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
## 2016-11-11 NOTE — ED Notes (Signed)
TTS done re eval; done

## 2016-11-11 NOTE — ED Notes (Signed)
Lunch tray delivered.

## 2016-11-11 NOTE — ED Notes (Signed)
Pt eating breakfast 

## 2016-11-11 NOTE — Discharge Instructions (Signed)
[facility] and treatment as soon as possible

## 2016-11-26 ENCOUNTER — Ambulatory Visit: Payer: Self-pay | Admitting: Internal Medicine

## 2016-12-15 ENCOUNTER — Encounter (HOSPITAL_COMMUNITY): Payer: Self-pay

## 2016-12-15 ENCOUNTER — Emergency Department (HOSPITAL_COMMUNITY)
Admission: EM | Admit: 2016-12-15 | Discharge: 2016-12-17 | Disposition: A | Discharge status: Home / Self Care | Payer: Federal, State, Local not specified - Other

## 2016-12-15 DIAGNOSIS — F17210 Nicotine dependence, cigarettes, uncomplicated: Secondary | ICD-10-CM | POA: Insufficient documentation

## 2016-12-15 DIAGNOSIS — I10 Essential (primary) hypertension: Secondary | ICD-10-CM | POA: Insufficient documentation

## 2016-12-15 DIAGNOSIS — Z79899 Other long term (current) drug therapy: Secondary | ICD-10-CM | POA: Insufficient documentation

## 2016-12-15 DIAGNOSIS — Z91010 Allergy to peanuts: Secondary | ICD-10-CM | POA: Insufficient documentation

## 2016-12-15 DIAGNOSIS — J45909 Unspecified asthma, uncomplicated: Secondary | ICD-10-CM | POA: Insufficient documentation

## 2016-12-15 DIAGNOSIS — R45851 Suicidal ideations: Secondary | ICD-10-CM

## 2016-12-15 DIAGNOSIS — F909 Attention-deficit hyperactivity disorder, unspecified type: Secondary | ICD-10-CM | POA: Insufficient documentation

## 2016-12-15 DIAGNOSIS — F1910 Other psychoactive substance abuse, uncomplicated: Secondary | ICD-10-CM

## 2016-12-15 LAB — SALICYLATE LEVEL

## 2016-12-15 LAB — COMPREHENSIVE METABOLIC PANEL
ALT: 25 U/L (ref 17–63)
AST: 30 U/L (ref 15–41)
Albumin: 3.6 g/dL (ref 3.5–5.0)
Alkaline Phosphatase: 72 U/L (ref 38–126)
Anion gap: 10 (ref 5–15)
BILIRUBIN TOTAL: 0.4 mg/dL (ref 0.3–1.2)
BUN: 5 mg/dL — AB (ref 6–20)
CHLORIDE: 108 mmol/L (ref 101–111)
CO2: 23 mmol/L (ref 22–32)
CREATININE: 0.9 mg/dL (ref 0.61–1.24)
Calcium: 9 mg/dL (ref 8.9–10.3)
Glucose, Bld: 124 mg/dL — ABNORMAL HIGH (ref 65–99)
Potassium: 3.3 mmol/L — ABNORMAL LOW (ref 3.5–5.1)
Sodium: 141 mmol/L (ref 135–145)
TOTAL PROTEIN: 6.5 g/dL (ref 6.5–8.1)

## 2016-12-15 LAB — RAPID URINE DRUG SCREEN, HOSP PERFORMED
AMPHETAMINES: NOT DETECTED
Barbiturates: NOT DETECTED
Benzodiazepines: NOT DETECTED
Cocaine: POSITIVE — AB
OPIATES: NOT DETECTED
Tetrahydrocannabinol: NOT DETECTED

## 2016-12-15 LAB — CBC
HCT: 44.7 % (ref 39.0–52.0)
Hemoglobin: 14.5 g/dL (ref 13.0–17.0)
MCH: 27.8 pg (ref 26.0–34.0)
MCHC: 32.4 g/dL (ref 30.0–36.0)
MCV: 85.6 fL (ref 78.0–100.0)
PLATELETS: 303 10*3/uL (ref 150–400)
RBC: 5.22 MIL/uL (ref 4.22–5.81)
RDW: 14.1 % (ref 11.5–15.5)
WBC: 7.7 10*3/uL (ref 4.0–10.5)

## 2016-12-15 LAB — ACETAMINOPHEN LEVEL: Acetaminophen (Tylenol), Serum: <10 ug/mL — ABNORMAL LOW (ref 10–30)

## 2016-12-15 LAB — ETHANOL: Alcohol, Ethyl (B): <5 mg/dL (ref <5)

## 2016-12-15 MED ORDER — GABAPENTIN 300 MG PO CAPS
300.0000 mg | ORAL_CAPSULE | Freq: Three times a day (TID) | ORAL | Status: DC
  Administered 2016-12-15 – 2016-12-17 (×5): 300 mg via ORAL
  Filled 2016-12-15 (×5): qty 1

## 2016-12-15 MED ORDER — BUSPIRONE HCL 10 MG PO TABS
10.0000 mg | ORAL_TABLET | Freq: Two times a day (BID) | ORAL | Status: DC
  Administered 2016-12-15 – 2016-12-17 (×4): 10 mg via ORAL
  Filled 2016-12-15 (×4): qty 1

## 2016-12-15 MED ORDER — HYDROXYZINE HCL 25 MG PO TABS: 25.0000 mg | ORAL_TABLET | Freq: Four times a day (QID) | ORAL | Status: DC | PRN

## 2016-12-15 MED ORDER — TRAZODONE HCL 50 MG PO TABS
50.0000 mg | ORAL_TABLET | Freq: Every evening | ORAL | Status: DC | PRN
  Administered 2016-12-16: 50 mg via ORAL
  Filled 2016-12-15: qty 1

## 2016-12-15 MED ORDER — ALBUTEROL SULFATE HFA 108 (90 BASE) MCG/ACT IN AERS
2.0000 | INHALATION_SPRAY | Freq: Four times a day (QID) | RESPIRATORY_TRACT | Status: DC | PRN
  Administered 2016-12-16 (×2): 2 via RESPIRATORY_TRACT
  Filled 2016-12-15: qty 6.7

## 2016-12-15 MED ORDER — GUAIFENESIN-DM 100-10 MG/5ML PO SYRP
5.0000 mL | ORAL_SOLUTION | ORAL | Status: DC | PRN
  Administered 2016-12-15 – 2016-12-17 (×5): 5 mL via ORAL
  Filled 2016-12-15 (×5): qty 5

## 2016-12-15 MED ORDER — ABACAVIR-DOLUTEGRAVIR-LAMIVUD 600-50-300 MG PO TABS
1.0000 | ORAL_TABLET | Freq: Every day | ORAL | Status: DC
  Administered 2016-12-15 – 2016-12-16 (×2): 1 via ORAL
  Filled 2016-12-15 (×3): qty 1

## 2016-12-15 MED ORDER — RISPERIDONE 3 MG PO TABS
3.0000 mg | ORAL_TABLET | Freq: Every day | ORAL | Status: DC
  Administered 2016-12-15 – 2016-12-16 (×2): 3 mg via ORAL
  Filled 2016-12-15 (×2): qty 1

## 2016-12-15 NOTE — ED Notes (Signed)
Pt to be brought to room once sitter arrives. 

## 2016-12-15 NOTE — ED Provider Notes (Signed)
[facility] DEPT Provider Note   CSN: [id number] Arrival date & time: 12/15/16  1422     History   Chief Complaint Chief Complaint  Patient presents with  . Suicidal    HPI Edwin Martinez is a 31 y.o. male.  This a 31 year old with long psychiatric history who was seen and evaluated on February 20.  He never followed up nor did he take his medications that were prescribed at that time.  He now presents with suicidal ideation, thinking he may drop in front of a heart or off a building or bridge.  He has not acted on this impulse but he does have a history of previous suicide attempts. He has been using crack cocaine on a regular basis for the past several weeks.  Denies alcohol use.      Past Medical History:  Diagnosis Date  . ADHD (attention deficit hyperactivity disorder)   . ADHD (attention deficit hyperactivity disorder) 09/12/2012  . Anxiety   . Asthma   . Bipolar 1 disorder (HCC)   . Bipolar disorder (HCC)   . Epileptic seizures (HCC)   . HIV (human immunodeficiency virus infection) (HCC)   . Hypertension   . Schizophrenia (HCC)   . Seizures [facility](HCC)     Patient Active Problem List   Diagnosis Date Noted  . Cocaine use disorder, mild, abuse 07/27/2016  . Cannabis use disorder, moderate, dependence (HCC) 07/27/2016  . Tobacco use disorder 07/27/2016  . Intentional drug overdose (HCC) 07/18/2016  . Bipolar disorder, current episode depressed, mild (HCC) 05/04/2016  . Asthma 05/20/2007  . Human immunodeficiency virus (HIV) disease (HCC) 05/05/2007    Past Surgical History:  Procedure Laterality Date  . DENTAL SURGERY         Home Medications    Prior to Admission medications   Medication Sig Start Date End Date Taking? Authorizing Provider  abacavir-dolutegravir-lamiVUDine (TRIUMEQ) 600-50-300 MG tablet Take 1 tablet by mouth at bedtime. 10/19/16   Judyann Munsonynthia Snider, MD  albuterol (PROVENTIL HFA;VENTOLIN HFA) 108 (90 Base) MCG/ACT inhaler Inhale 2  puffs into the lungs every 6 (six) hours as needed for wheezing or shortness of breath. 07/29/16   Jimmy FootmanAndrea Hernandez-Gonzalez, MD  benztropine (COGENTIN) 1 MG tablet Take 1 tablet (1 mg total) by mouth at bedtime. 08/22/16   Oneta Rackanika N Lewis, NP  busPIRone (BUSPAR) 10 MG tablet Take 1 tablet (10 mg total) by mouth 2 (two) times daily. 11/11/16   Mancel BaleElliott Wentz, MD  citalopram (CELEXA) 20 MG tablet Take 1 tablet (20 mg total) by mouth daily. 08/23/16   Oneta Rackanika N Lewis, NP  gabapentin (NEURONTIN) 300 MG capsule Take 1 capsule (300 mg total) by mouth 3 (three) times daily. 11/11/16   Mancel BaleElliott Wentz, MD  guaiFENesin-dextromethorphan (ROBITUSSIN DM) 100-10 MG/5ML syrup Take 5 mLs by mouth every 4 (four) hours as needed for cough. Patient not taking: Reported on 11/09/2016 09/26/16   Arthor CaptainAbigail Harris, PA-C  hydrOXYzine (ATARAX/VISTARIL) 25 MG tablet Take 1 tablet (25 mg total) by mouth every 6 (six) hours as needed for anxiety. 08/22/16   Oneta Rackanika N Lewis, NP  risperiDONE (RISPERDAL) 1 MG tablet Take 1 tablet (1 mg total) by mouth daily. Take 3 tablet  (3mg  total) by mouth at bed time Patient taking differently: Take 3 mg by mouth at bedtime.  08/23/16   Oneta Rackanika N Lewis, NP  traZODone (DESYREL) 50 MG tablet Take 1 tablet (50 mg total) by mouth at bedtime as needed for sleep. 08/22/16   Oneta Rackanika N Lewis, NP  Family History Family History  Problem Relation Age of Onset  . Huntington's disease Father   . Heart disease Mother   . Suicidality Maternal Uncle   . Suicidality Maternal Grandmother     Social History Social History  Substance Use Topics  . Smoking status: Current Every Day Smoker    Packs/day: 0.50    Types: Cigarettes    Start date: 09/29/1991  . Smokeless tobacco: Never Used  . Alcohol use 1.2 oz/week    2 Standard drinks or equivalent per week     Comment: once week      Allergies   Magnesium-containing compounds; Peanut-containing drug products; Atripla [efavirenz-emtricitab-tenofovir];  Esomeprazole magnesium; Bactrim [sulfamethoxazole-trimethoprim]; and Penicillins   Review of Systems Review of Systems  Constitutional: Negative for fever.  Respiratory: Negative for cough and shortness of breath.   Cardiovascular: Negative for chest pain.  Gastrointestinal: Negative for abdominal pain.  Neurological: Negative for headaches.  Psychiatric/Behavioral: Positive for suicidal ideas.  All other systems reviewed and are negative.    Physical Exam Updated Vital Signs BP 131/76 (BP Location: Left Arm)   Pulse 73   Temp 98.8 F (37.1 C) (Oral)   Resp 16   SpO2 98%   Physical Exam  Constitutional: He appears well-developed and well-nourished.  HENT:  Head: Normocephalic.  Eyes: Pupils are equal, round, and reactive to light.  Cardiovascular: Normal rate.   Pulmonary/Chest: Effort normal.  Musculoskeletal: Normal range of motion.  Neurological: He is alert.  Skin: Skin is warm.  Psychiatric: His behavior is normal. Cognition and memory are normal. He expresses inappropriate judgment. He expresses suicidal ideation. He expresses suicidal plans.  Nursing note and vitals reviewed.    ED Treatments / Results  Labs (all labs ordered are listed, but only abnormal results are displayed) Labs Reviewed  COMPREHENSIVE METABOLIC PANEL - Abnormal; Notable for the following:       Result Value   Potassium 3.3 (*)    Glucose, Bld 124 (*)    BUN 5 (*)    All other components within normal limits  ACETAMINOPHEN LEVEL - Abnormal; Notable for the following:    Acetaminophen (Tylenol), Serum <10 (*)    All other components within normal limits  RAPID URINE DRUG SCREEN, HOSP PERFORMED - Abnormal; Notable for the following:    Cocaine POSITIVE (*)    All other components within normal limits  ETHANOL  SALICYLATE LEVEL  CBC    EKG  EKG Interpretation None       Radiology No results found.  Procedures Procedures (including critical care time)  Medications  Ordered in ED Medications  abacavir-dolutegravir-lamiVUDine (TRIUMEQ) 600-50-300 MG per tablet 1 tablet (not administered)  albuterol (PROVENTIL HFA;VENTOLIN HFA) 108 (90 Base) MCG/ACT inhaler 2 puff (not administered)  busPIRone (BUSPAR) tablet 10 mg (not administered)  gabapentin (NEURONTIN) capsule 300 mg (not administered)  guaiFENesin-dextromethorphan (ROBITUSSIN DM) 100-10 MG/5ML syrup 5 mL (5 mLs Oral Given 12/15/16 2223)  hydrOXYzine (ATARAX/VISTARIL) tablet 25 mg (not administered)  risperiDONE (RISPERDAL) tablet 3 mg (not administered)  traZODone (DESYREL) tablet 50 mg (not administered)     Initial Impression / Assessment and Plan / ED Course  I have reviewed the triage vital signs and the nursing notes.  Pertinent labs & imaging results that were available during my care of the patient were reviewed by me and considered in my medical decision making (see chart for details).      Labs have been evaluated.  Patient is cleared for psychiatric evaluation Patient  has been assessed by TTS and does meet criteria for admission.  There is no bed available tonight at [facility] that will be reevaluated in the morning. Final Clinical Impressions(s) / ED Diagnoses   Final diagnoses:  Suicidal ideation  Suicidal thoughts  Polysubstance abuse    New Prescriptions New Prescriptions   No medications on file     Earley Favor, NP 12/15/16 2114    Earley Favor, NP 12/15/16 2115    Earley Favor, NP 12/15/16 2241    Vanetta Mulders, MD 12/18/16 [phone]
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
## 2016-12-15 NOTE — ED Triage Notes (Signed)
Patient reports that he has been off bipolar meds for 1 month and last used crack cocaine last night. States that he is homeless and feels suicidal, no plan. Appears in NAD. Denies ETOH.

## 2016-12-15 NOTE — ED Notes (Addendum)
TTS at bedside. No sitter at bedside at the moment.

## 2016-12-15 NOTE — ED Notes (Signed)
Patient called 3 times for vital recheck and did not answer.

## 2016-12-16 MED ORDER — POTASSIUM CHLORIDE CRYS ER 20 MEQ PO TBCR
40.0000 meq | EXTENDED_RELEASE_TABLET | Freq: Once | ORAL | Status: AC
  Administered 2016-12-16: 40 meq via ORAL
  Filled 2016-12-16: qty 2

## 2016-12-16 NOTE — ED Provider Notes (Signed)
Resting comfortably in bed. Denies complaint. Noted to be mildly hypokalemic. Oral potassium supplementation ordered Results for orders placed or performed during the hospital encounter of 12/15/16  Comprehensive metabolic panel  Result Value Ref Range   Sodium 141 135 - 145 mmol/L   Potassium 3.3 (L) 3.5 - 5.1 mmol/L   Chloride 108 101 - 111 mmol/L   CO2 23 22 - 32 mmol/L   Glucose, Bld 124 (H) 65 - 99 mg/dL   BUN 5 (L) 6 - 20 mg/dL   Creatinine, Ser 2.440.90 0.61 - 1.24 mg/dL   Calcium 9.0 8.9 - 01.010.3 mg/dL   Total Protein 6.5 6.5 - 8.1 g/dL   Albumin 3.6 3.5 - 5.0 g/dL   AST 30 15 - 41 U/L   ALT 25 17 - 63 U/L   Alkaline Phosphatase 72 38 - 126 U/L   Total Bilirubin 0.4 0.3 - 1.2 mg/dL   GFR calc non Af Amer >60 >60 mL/min   GFR calc Af Amer >60 >60 mL/min   Anion gap 10 5 - 15  Ethanol  Result Value Ref Range   Alcohol, Ethyl (B) <5 <5 mg/dL  Salicylate level  Result Value Ref Range   Salicylate Lvl <7.0 2.8 - 30.0 mg/dL  Acetaminophen level  Result Value Ref Range   Acetaminophen (Tylenol), Serum <10 (L) 10 - 30 ug/mL  cbc  Result Value Ref Range   WBC 7.7 4.0 - 10.5 K/uL   RBC 5.22 4.22 - 5.81 MIL/uL   Hemoglobin 14.5 13.0 - 17.0 g/dL   HCT 27.244.7 53.639.0 - 64.452.0 %   MCV 85.6 78.0 - 100.0 fL   MCH 27.8 26.0 - 34.0 pg   MCHC 32.4 30.0 - 36.0 g/dL   RDW 03.414.1 74.211.5 - 59.515.5 %   Platelets 303 150 - 400 K/uL  Rapid urine drug screen (hospital performed)  Result Value Ref Range   Opiates NONE DETECTED NONE DETECTED   Cocaine POSITIVE (A) NONE DETECTED   Benzodiazepines NONE DETECTED NONE DETECTED   Amphetamines NONE DETECTED NONE DETECTED   Tetrahydrocannabinol NONE DETECTED NONE DETECTED   Barbiturates NONE DETECTED NONE DETECTED   No results found.   Doug SouSam Joe Gee, MD 12/17/16 1249

## 2016-12-16 NOTE — ED Notes (Signed)
Pt coming to the door asking for something to drink. He is informed he will get something at snack time. Pt states the RN said he could have something not. This tech informed pt the nurse would be back in a moment.

## 2016-12-16 NOTE — ED Notes (Signed)
Pt reporting that he is coughing bad and is unable to lay flat, having discomfort. NP aware. Will give albuterol treatment and continue to monitor.

## 2016-12-16 NOTE — Progress Notes (Signed)
Reviewed pt's chart- pt was assessed by TTS 10/20 and inpatient treatment was recommended. [facility] hall male [facility]  [facility] ref[facility]- per [facility]- per [facility]- per Virgel Bouquetarlene  [facility](Joanie) advises no beds currently but check back late afternoon in case there have been unexpected d/c's.  Ilean SkillMeghan Delontae Lamm, MSW, LCSW Clinical Social Work, Disposition  12/16/2016 [phone]

## 2016-12-16 NOTE — ED Notes (Signed)
Pt requested coke, chocolate milk, short bread cookies, graham crackers and peanut butter for snack. Pt given the same.

## 2016-12-17 ENCOUNTER — Other Ambulatory Visit: Payer: Self-pay | Admitting: Internal Medicine

## 2016-12-17 ENCOUNTER — Other Ambulatory Visit: Payer: Self-pay | Admitting: *Deleted

## 2016-12-17 ENCOUNTER — Other Ambulatory Visit: Payer: Self-pay

## 2016-12-17 DIAGNOSIS — Z813 Family history of other psychoactive substance abuse and dependence: Secondary | ICD-10-CM

## 2016-12-17 DIAGNOSIS — Z79899 Other long term (current) drug therapy: Secondary | ICD-10-CM

## 2016-12-17 DIAGNOSIS — F319 Bipolar disorder, unspecified: Secondary | ICD-10-CM | POA: Diagnosis not present

## 2016-12-17 DIAGNOSIS — R45851 Suicidal ideations: Secondary | ICD-10-CM

## 2016-12-17 DIAGNOSIS — F17210 Nicotine dependence, cigarettes, uncomplicated: Secondary | ICD-10-CM

## 2016-12-17 DIAGNOSIS — Z818 Family history of other mental and behavioral disorders: Secondary | ICD-10-CM

## 2016-12-17 DIAGNOSIS — B20 Human immunodeficiency virus [HIV] disease: Secondary | ICD-10-CM

## 2016-12-17 DIAGNOSIS — Z830 Family history of human immunodeficiency virus [HIV] disease: Secondary | ICD-10-CM

## 2016-12-17 DIAGNOSIS — Z113 Encounter for screening for infections with a predominantly sexual mode of transmission: Secondary | ICD-10-CM

## 2016-12-17 LAB — COMPLETE METABOLIC PANEL WITH GFR
ALT: 17 U/L (ref 9–46)
AST: 17 U/L (ref 10–40)
Albumin: 3.7 g/dL (ref 3.6–5.1)
Alkaline Phosphatase: 85 U/L (ref 40–115)
BILIRUBIN TOTAL: 0.1 mg/dL — AB (ref 0.2–1.2)
BUN: 7 mg/dL (ref 7–25)
CHLORIDE: 110 mmol/L (ref 98–110)
CO2: 20 mmol/L (ref 20–31)
Calcium: 8.6 mg/dL (ref 8.6–10.3)
Creat: 1.05 mg/dL (ref 0.60–1.35)
GFR, Est African American: >89 mL/min (ref >=60)
Glucose, Bld: 129 mg/dL — ABNORMAL HIGH (ref 65–99)
POTASSIUM: 3.7 mmol/L (ref 3.5–5.3)
Sodium: 140 mmol/L (ref 135–146)
TOTAL PROTEIN: 6.3 g/dL (ref 6.1–8.1)

## 2016-12-17 LAB — CBC WITH DIFFERENTIAL/PLATELET
BASOS ABS: 84 {cells}/uL (ref 0–200)
Basophils Relative: 1 %
EOS ABS: 168 {cells}/uL (ref 15–500)
Eosinophils Relative: 2 %
HCT: 41.9 % (ref 38.5–50.0)
Hemoglobin: 13.5 g/dL (ref 13.2–17.1)
Lymphocytes Relative: 36 %
Lymphs Abs: 3024 cells/uL (ref 850–3900)
MCH: 27.7 pg (ref 27.0–33.0)
MCHC: 32.2 g/dL (ref 32.0–36.0)
MCV: 85.9 fL (ref 80.0–100.0)
MPV: 9.8 fL (ref 7.5–12.5)
Monocytes Absolute: 588 cells/uL (ref 200–950)
Monocytes Relative: 7 %
NEUTROS ABS: 4536 {cells}/uL (ref 1500–7800)
NEUTROS PCT: 54 %
Platelets: 337 10*3/uL (ref 140–400)
RBC: 4.88 MIL/uL (ref 4.20–5.80)
RDW: 13.8 % (ref 11.0–15.0)
WBC: 8.4 10*3/uL (ref 3.8–10.8)

## 2016-12-17 MED ORDER — ALBUTEROL SULFATE HFA 108 (90 BASE) MCG/ACT IN AERS
2.0000 | INHALATION_SPRAY | RESPIRATORY_TRACT | Status: DC | PRN
  Administered 2016-12-17: 2 via RESPIRATORY_TRACT
  Filled 2016-12-17: qty 6.7

## 2016-12-17 NOTE — ED Notes (Signed)
Regular Diet has Been Ordered for [employer].

## 2016-12-17 NOTE — ED Provider Notes (Signed)
Patient has been cleared for discharge by psychiatry. He has been given information about following up with [name].   Rolan BuccoMelanie Tashiya Souders, MD 12/17/16 [phone]

## 2016-12-17 NOTE — Telephone Encounter (Signed)
Error

## 2016-12-17 NOTE — ED Notes (Signed)
Declined W/C at D/C and was escorted to lobby by RN. 

## 2016-12-17 NOTE — ED Notes (Signed)
Patient was given a snack and drink, and A Regular Diet was taken for Lunch. 
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
## 2016-12-18 ENCOUNTER — Encounter: Payer: Self-pay | Admitting: Internal Medicine

## 2016-12-18 LAB — RPR

## 2016-12-18 LAB — T-HELPER CELL (CD4) - (RCID CLINIC ONLY)
CD4 T CELL HELPER: 37 % (ref 33–55)
CD4 T Cell Abs: 1160 /uL (ref 400–2700)

## 2016-12-19 ENCOUNTER — Emergency Department (HOSPITAL_COMMUNITY)
Admission: EM | Admit: 2016-12-19 | Discharge: 2016-12-21 | Disposition: A | Discharge status: Home / Self Care | Payer: Federal, State, Local not specified - Other | Attending: Emergency Medicine | Admitting: Emergency Medicine

## 2016-12-19 ENCOUNTER — Emergency Department (HOSPITAL_COMMUNITY): Payer: Self-pay

## 2016-12-19 ENCOUNTER — Encounter (HOSPITAL_COMMUNITY): Payer: Self-pay

## 2016-12-19 DIAGNOSIS — Y9389 Activity, other specified: Secondary | ICD-10-CM | POA: Insufficient documentation

## 2016-12-19 DIAGNOSIS — F1410 Cocaine abuse, uncomplicated: Secondary | ICD-10-CM | POA: Diagnosis present

## 2016-12-19 DIAGNOSIS — R05 Cough: Secondary | ICD-10-CM | POA: Insufficient documentation

## 2016-12-19 DIAGNOSIS — Z79899 Other long term (current) drug therapy: Secondary | ICD-10-CM | POA: Insufficient documentation

## 2016-12-19 DIAGNOSIS — Y92410 Unspecified street and highway as the place of occurrence of the external cause: Secondary | ICD-10-CM | POA: Insufficient documentation

## 2016-12-19 DIAGNOSIS — R059 Cough, unspecified: Secondary | ICD-10-CM

## 2016-12-19 DIAGNOSIS — Z91010 Allergy to peanuts: Secondary | ICD-10-CM | POA: Insufficient documentation

## 2016-12-19 DIAGNOSIS — J45909 Unspecified asthma, uncomplicated: Secondary | ICD-10-CM | POA: Insufficient documentation

## 2016-12-19 DIAGNOSIS — T1491XA Suicide attempt, initial encounter: Secondary | ICD-10-CM | POA: Insufficient documentation

## 2016-12-19 DIAGNOSIS — I10 Essential (primary) hypertension: Secondary | ICD-10-CM | POA: Insufficient documentation

## 2016-12-19 DIAGNOSIS — R45851 Suicidal ideations: Secondary | ICD-10-CM

## 2016-12-19 DIAGNOSIS — F3131 Bipolar disorder, current episode depressed, mild: Secondary | ICD-10-CM | POA: Diagnosis present

## 2016-12-19 DIAGNOSIS — F17210 Nicotine dependence, cigarettes, uncomplicated: Secondary | ICD-10-CM | POA: Insufficient documentation

## 2016-12-19 DIAGNOSIS — Y999 Unspecified external cause status: Secondary | ICD-10-CM | POA: Insufficient documentation

## 2016-12-19 DIAGNOSIS — X58XXXA Exposure to other specified factors, initial encounter: Secondary | ICD-10-CM | POA: Insufficient documentation

## 2016-12-19 LAB — CBC
HEMATOCRIT: 40.1 % (ref 39.0–52.0)
HEMOGLOBIN: 13.1 g/dL (ref 13.0–17.0)
MCH: 27.8 pg (ref 26.0–34.0)
MCHC: 32.7 g/dL (ref 30.0–36.0)
MCV: 85.1 fL (ref 78.0–100.0)
Platelets: 329 10*3/uL (ref 150–400)
RBC: 4.71 MIL/uL (ref 4.22–5.81)
RDW: 14.2 % (ref 11.5–15.5)
WBC: 9.9 10*3/uL (ref 4.0–10.5)

## 2016-12-19 LAB — COMPREHENSIVE METABOLIC PANEL
ALBUMIN: 3.7 g/dL (ref 3.5–5.0)
ALK PHOS: 94 U/L (ref 38–126)
ALT: 28 U/L (ref 17–63)
ANION GAP: 10 (ref 5–15)
AST: 47 U/L — ABNORMAL HIGH (ref 15–41)
BUN: 12 mg/dL (ref 6–20)
CALCIUM: 9.1 mg/dL (ref 8.9–10.3)
CHLORIDE: 107 mmol/L (ref 101–111)
CO2: 24 mmol/L (ref 22–32)
Creatinine, Ser: 1.03 mg/dL (ref 0.61–1.24)
GFR calc non Af Amer: >60 mL/min (ref >60)
GLUCOSE: 123 mg/dL — AB (ref 65–99)
POTASSIUM: 3.6 mmol/L (ref 3.5–5.1)
SODIUM: 141 mmol/L (ref 135–145)
Total Bilirubin: 0.4 mg/dL (ref 0.3–1.2)
Total Protein: 6.6 g/dL (ref 6.5–8.1)

## 2016-12-19 LAB — SALICYLATE LEVEL

## 2016-12-19 LAB — HIV-1 RNA,QN PCR W/REFLEX GENOTYPE
HIV-1 RNA, QN PCR: 231000 Copies/mL — ABNORMAL HIGH
HIV-1 RNA, QN PCR: 5.36 Log cps/mL — ABNORMAL HIGH

## 2016-12-19 LAB — ACETAMINOPHEN LEVEL

## 2016-12-19 LAB — ETHANOL: Alcohol, Ethyl (B): <5 mg/dL (ref <5)

## 2016-12-19 MED ORDER — ABACAVIR-DOLUTEGRAVIR-LAMIVUD 600-50-300 MG PO TABS
1.0000 | ORAL_TABLET | Freq: Every day | ORAL | Status: DC
  Administered 2016-12-20 (×2): 1 via ORAL
  Filled 2016-12-19 (×2): qty 1

## 2016-12-19 MED ORDER — GABAPENTIN 300 MG PO CAPS
300.0000 mg | ORAL_CAPSULE | Freq: Three times a day (TID) | ORAL | Status: DC
  Administered 2016-12-20 – 2016-12-21 (×5): 300 mg via ORAL
  Filled 2016-12-19 (×5): qty 1

## 2016-12-19 MED ORDER — ALBUTEROL SULFATE HFA 108 (90 BASE) MCG/ACT IN AERS
2.0000 | INHALATION_SPRAY | Freq: Four times a day (QID) | RESPIRATORY_TRACT | Status: DC | PRN
  Administered 2016-12-20 (×2): 2 via RESPIRATORY_TRACT
  Filled 2016-12-19: qty 6.7

## 2016-12-19 MED ORDER — RISPERIDONE 3 MG PO TABS
3.0000 mg | ORAL_TABLET | Freq: Every day | ORAL | Status: DC
  Administered 2016-12-20 (×2): 3 mg via ORAL
  Filled 2016-12-19 (×2): qty 1

## 2016-12-19 MED ORDER — HYDROXYZINE HCL 25 MG PO TABS
25.0000 mg | ORAL_TABLET | Freq: Four times a day (QID) | ORAL | Status: DC | PRN
Start: 2016-12-19 — End: 2016-12-21

## 2016-12-19 MED ORDER — HYDROCOD POLST-CPM POLST ER 10-8 MG/5ML PO SUER
5.0000 mL | Freq: Once | ORAL | Status: AC
  Administered 2016-12-19: 5 mL via ORAL
  Filled 2016-12-19: qty 5

## 2016-12-19 MED ORDER — TRAZODONE HCL 50 MG PO TABS: 50.0000 mg | ORAL_TABLET | Freq: Every evening | ORAL | Status: DC | PRN

## 2016-12-19 MED ORDER — BUSPIRONE HCL 10 MG PO TABS
10.0000 mg | ORAL_TABLET | Freq: Two times a day (BID) | ORAL | Status: DC
  Administered 2016-12-20 – 2016-12-21 (×4): 10 mg via ORAL
  Filled 2016-12-19 (×4): qty 1

## 2016-12-19 NOTE — ED Provider Notes (Signed)
[facility] DEPT Provider Note   CSN: [id number] Arrival date & time: 12/19/16  2150     History   Chief Complaint Chief Complaint  Patient presents with  . Suicide Attempt  . Cough    HPI Edwin Martinez is a 31 y.o. male.  HPI  Patient presents with concern of ongoing cough, as well as suicidal ideation and attempt. He acknowledges a history of multiple medical issues. He states that he has been hospitalized multiple times, has been attempting to follow up with outpatient psychiatry providers. However, he notes that he has had difficulty with obtaining all of his medication. Over the past months he has had persistent auditory hallucination, and over the past days these voices have been more dominant, instructing him to kill himself. Earlier today the patient stopped in from the car, while crossing a street, possibly in an attempt to kill himself. Patient was not struck by the vehicle.  Patient also has secondary concern of ongoing cough, chest discomfort with coughing, though no true chest pain, no fever. It is unclear if he has taken any medication for cough relief.   Past Medical History:  Diagnosis Date  . ADHD (attention deficit hyperactivity disorder)   . ADHD (attention deficit hyperactivity disorder) 09/12/2012  . Anxiety   . Asthma   . Bipolar 1 disorder (HCC)   . Bipolar disorder (HCC)   . Epileptic seizures (HCC)   . HIV (human immunodeficiency virus infection) (HCC)   . Hypertension   . Schizophrenia (HCC)   . Seizures [facility]HCC)     Patient Active Problem List   Diagnosis Date Noted  . Suicidal ideation   . Cocaine use disorder, mild, abuse 07/27/2016  . Cannabis use disorder, moderate, dependence (HCC) 07/27/2016  . Tobacco use disorder 07/27/2016  . Intentional drug overdose (HCC) 07/18/2016  . Bipolar disorder, current episode depressed, mild (HCC) 05/04/2016  . Asthma 05/20/2007  . Human immunodeficiency virus (HIV) disease (HCC) 05/05/2007     Past Surgical History:  Procedure Laterality Date  . DENTAL SURGERY         Home Medications    Prior to Admission medications   Medication Sig Start Date End Date Taking? Authorizing Provider  abacavir-dolutegravir-lamiVUDine (TRIUMEQ) 600-50-300 MG tablet Take 1 tablet by mouth at bedtime. 10/19/16   Judyann Munsonynthia Snider, MD  albuterol (PROVENTIL HFA;VENTOLIN HFA) 108 (90 Base) MCG/ACT inhaler Inhale 2 puffs into the lungs every 6 (six) hours as needed for wheezing or shortness of breath. 07/29/16   Jimmy FootmanAndrea Hernandez-Gonzalez, MD  benztropine (COGENTIN) 1 MG tablet Take 1 tablet (1 mg total) by mouth at bedtime. Patient not taking: Reported on 12/17/2016 08/22/16   Oneta Rackanika N Lewis, NP  busPIRone (BUSPAR) 10 MG tablet Take 1 tablet (10 mg total) by mouth 2 (two) times daily. 11/11/16   Mancel BaleElliott Wentz, MD  citalopram (CELEXA) 20 MG tablet Take 1 tablet (20 mg total) by mouth daily. Patient not taking: Reported on 12/17/2016 08/23/16   Oneta Rackanika N Lewis, NP  gabapentin (NEURONTIN) 300 MG capsule Take 1 capsule (300 mg total) by mouth 3 (three) times daily. 11/11/16   Mancel BaleElliott Wentz, MD  hydrOXYzine (ATARAX/VISTARIL) 25 MG tablet Take 1 tablet (25 mg total) by mouth every 6 (six) hours as needed for anxiety. 08/22/16   Oneta Rackanika N Lewis, NP  risperiDONE (RISPERDAL) 1 MG tablet Take 1 tablet (1 mg total) by mouth daily. Take 3 tablet  (3mg  total) by mouth at bed time Patient taking differently: Take 3 mg by mouth  at bedtime.  08/23/16   Oneta Rack, NP  traZODone (DESYREL) 50 MG tablet Take 1 tablet (50 mg total) by mouth at bedtime as needed for sleep. 08/22/16   Oneta Rack, NP    Family History Family History  Problem Relation Age of Onset  . Huntington's disease Father   . Heart disease Mother   . Suicidality Maternal Uncle   . Suicidality Maternal Grandmother     Social History Social History  Substance Use Topics  . Smoking status: Current Every Day Smoker    Packs/day: 0.50     Types: Cigarettes    Start date: 09/29/1991  . Smokeless tobacco: Never Used  . Alcohol use 1.2 oz/week    2 Standard drinks or equivalent per week     Comment: once week      Allergies   Magnesium-containing compounds; Peanut-containing drug products; Esomeprazole magnesium; Atripla [efavirenz-emtricitab-tenofovir]; Bactrim [sulfamethoxazole-trimethoprim]; and Penicillins   Review of Systems Review of Systems  Constitutional:       Per HPI, otherwise negative  HENT:       Per HPI, otherwise negative  Respiratory:       Per HPI, otherwise negative  Cardiovascular:       Per HPI, otherwise negative  Gastrointestinal: Negative for vomiting.  Endocrine:       Negative aside from HPI  Genitourinary:       Neg aside from HPI   Musculoskeletal:       Per HPI, otherwise negative  Skin: Negative.   Allergic/Immunologic: Positive for immunocompromised state.  Neurological: Negative for syncope.  Psychiatric/Behavioral: Positive for confusion, decreased concentration, dysphoric mood, hallucinations, self-injury, sleep disturbance and suicidal ideas. The patient is nervous/anxious.      Physical Exam Updated Vital Signs BP 127/80 (BP Location: Left Arm)   Pulse 71   Temp 98.7 F (37.1 C) (Oral)   Resp 18   SpO2 100%   Physical Exam  Constitutional: He is oriented to person, place, and time. He appears well-developed. No distress.  HENT:  Head: Normocephalic and atraumatic.  Eyes: Conjunctivae and EOM are normal.  Cardiovascular: Normal rate and regular rhythm.   Pulmonary/Chest: Effort normal. No stridor. No respiratory distress.  Cough, but no respiratory distress  Abdominal: He exhibits no distension.  Musculoskeletal: He exhibits no edema.  Neurological: He is alert and oriented to person, place, and time.  Skin: Skin is warm and dry.  Psychiatric: He has a normal mood and affect. Thought content is not delusional. Cognition and memory are not impaired. He expresses  suicidal ideation. He expresses suicidal plans.  Nursing note and vitals reviewed.    ED Treatments / Results  Labs (all labs ordered are listed, but only abnormal results are displayed) Labs Reviewed  COMPREHENSIVE METABOLIC PANEL - Abnormal; Notable for the following:       Result Value   Glucose, Bld 123 (*)    AST 47 (*)    All other components within normal limits  ACETAMINOPHEN LEVEL - Abnormal; Notable for the following:    Acetaminophen (Tylenol), Serum <10 (*)    All other components within normal limits  ETHANOL  SALICYLATE LEVEL  CBC  RAPID URINE DRUG SCREEN, HOSP PERFORMED     Radiology Dg Chest 2 View  Result Date: 12/19/2016 CLINICAL DATA:  Initial evaluation for acute cough, congestion, shortness of breath. EXAM: CHEST  2 VIEW COMPARISON:  Prior radiograph from 10/20/2016. FINDINGS: The cardiac and mediastinal silhouettes are stable in size and contour,  and remain within normal limits. The lungs are normally inflated. No airspace consolidation, pleural effusion, or pulmonary edema is identified. There is no pneumothorax. No acute osseous abnormality identified. IMPRESSION: No radiographic evidence for active cardiopulmonary disease. Electronically Signed   By: Rise Mu M.D.   On: 12/19/2016 23:02    Procedures Procedures (including critical care time)  Medications Ordered in ED Medications  chlorpheniramine-HYDROcodone (TUSSIONEX) 10-8 MG/5ML suspension 5 mL (5 mLs Oral Given 12/19/16 2307)     Initial Impression / Assessment and Plan / ED Course  I have reviewed the triage vital signs and the nursing notes.  Pertinent labs & imaging results that were available during my care of the patient were reviewed by me and considered in my medical decision making (see chart for details).  On repeat exam the patient is in similar condition. Labs, x-ray, vitals all reassuring. Though the patient has HIV, cough, no evidence for pneumonia, bacteremia,  sepsis. Patient is medically cleared for psychiatric evaluation.   Final Clinical Impressions(s) / ED Diagnoses  Suicidal ideation Cough   Gerhard Munch, MD 12/19/16 2322

## 2016-12-19 NOTE — ED Notes (Signed)
Patient transported to X-ray 

## 2016-12-19 NOTE — ED Triage Notes (Addendum)
Onset 1 week productive cough- thick, dark green sometimes brown.  Pt was seen in ED last week for SI, has been off meds x 1 month.  [facility] but has not done.  Pt reports he attempted suicide tonight, he stepped out in front of car but car stopped.  Pt endorses hearing voices.  Pt states in epigastric area he has a painful swollen area.

## 2016-12-19 NOTE — ED Notes (Signed)
Pt. returned from XR. 

## 2016-12-20 LAB — RAPID URINE DRUG SCREEN, HOSP PERFORMED
Amphetamines: NOT DETECTED
BARBITURATES: NOT DETECTED
Benzodiazepines: NOT DETECTED
Cocaine: POSITIVE — AB
Opiates: NOT DETECTED
Tetrahydrocannabinol: NOT DETECTED

## 2016-12-20 MED ORDER — GUAIFENESIN 100 MG/5ML PO SOLN
5.0000 mL | ORAL | Status: DC | PRN
  Administered 2016-12-20 – 2016-12-21 (×2): 100 mg via ORAL
  Filled 2016-12-20 (×2): qty 10

## 2016-12-20 NOTE — ED Notes (Signed)
Offered pt to shower - advised he will later when is more awake.

## 2016-12-20 NOTE — ED Notes (Signed)
Patient currently conducting TTS assessment in room.

## 2016-12-20 NOTE — ED Notes (Signed)
Ambulatory to shower w/Sitter.  

## 2016-12-20 NOTE — ED Notes (Signed)
Breakfast order placed ?
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
## 2016-12-20 NOTE — ED Notes (Signed)
Woke pt so he may eat dinner. Pt noted w/nonprod cough - Albuterol Inhaler given. Pt also given shower supplies.

## 2016-12-20 NOTE — Progress Notes (Signed)
Patient was [facility] to the following inpatient [facility] facilities: [place], [facility], [place], [facility] [facility], [facility].  At capacity: [facility], [facility], [place], [facility], [facility], [place], [address] [place], [place] [facility].  CSW in disposition will continue to follow up and seek placement for patient.  Edwin Martinez, LCSWA Disposition staff 12/20/2016 12:16 PM

## 2016-12-21 DIAGNOSIS — F1410 Cocaine abuse, uncomplicated: Secondary | ICD-10-CM | POA: Diagnosis not present

## 2016-12-21 DIAGNOSIS — F3131 Bipolar disorder, current episode depressed, mild: Secondary | ICD-10-CM | POA: Diagnosis not present

## 2016-12-21 DIAGNOSIS — F17210 Nicotine dependence, cigarettes, uncomplicated: Secondary | ICD-10-CM

## 2016-12-21 DIAGNOSIS — Z818 Family history of other mental and behavioral disorders: Secondary | ICD-10-CM | POA: Diagnosis not present

## 2016-12-21 DIAGNOSIS — Z79899 Other long term (current) drug therapy: Secondary | ICD-10-CM

## 2016-12-21 LAB — URINE CYTOLOGY ANCILLARY ONLY
CHLAMYDIA, DNA PROBE: NEGATIVE
Neisseria Gonorrhea: NEGATIVE

## 2016-12-21 NOTE — ED Notes (Signed)
TTS completed. 

## 2016-12-21 NOTE — Discharge Instructions (Signed)
We saw you in the ER for your mental health concerns and had our behavioral health team evaluate you. °The team feels comfortable sending you home, please follow the recommendations given to you by them and the follow-up and medications they have prescribed to you. °Please refrain from substance abuse. °Return to the ER if your symptoms worsen. ° ° ° °Substance Abuse Treatment Programs ° °Intensive Outpatient Programs °[facility]     °[address]      °[place], [place]                   °[phone]      ° °[facility] °[address] °[place], [place] °[phone] ° °[facility]     °(Inpatient and outpatient)     °[address]           °[phone]   ° °[facility] °[phone] (Suboxone and Methadone) ° °[address]      °[place], [name] [address]      °[phone]      ° °[address] °[place], [place] °852-3033 ° °[facility] (Outpatient/Inpatient, [facility])    °(insurance only) [phone]      °       °[facility] (Groups & Residential) °[place], [place] °[phone] ° °   °[facility]     °[address]     °[place], [place]      °[phone]      ° °[facility] (for caregivers and family) °[address] °[place], [place] °[phone] ° ° ° ° ° °Residential Treatment Programs °[facility]      °[address], [place], [place] [address]  °[phone]      ° °[facility] °[address] [place], [place] [address] °[phone] ° °[facility]        °[phone]      ° °[facility] °[phone] ° °[facility]             °[address]                                         °[place], [place]                                                °[phone] or [phone]                              ° °[facility] [place] °[address] °[place], [address] °[phone] ° °[facility]    °[address]      °[place], [place]     °[phone]      ° °[facility] °[address] °[place], [place] °[phone] ° °[facility]   °[address]     °[place], [place] [address]     °[phone]      °Admissions: 8am-3pm M-F ° °[facility] °[address] °[place], [place] °[phone] ° °[facility]: Residential Program (90 Days)   °[place], [place]      °[phone] or [phone]    ° °  [facility] [place], [place] (Walk in Hours over the weekend or by referral)  [facility] [address] [place], [place], [place] [address] [phone]  Crisis Mobile: [facility]:  [phone] (for crisis response 24 hours a day) [facility] Hotline:      [phone] Outpatient Psychiatry and Counseling  [facility]: Mobile Crisis Management 24 hours:  [phone]  [facility] sliding scale fee and walk in schedule: M-F 8am-12pm/1pm-3pm [address]  [place], [place] [address] [phone]  [facility] [address] [place], [place] [address] [phone]  [facility] ([name] new patient walk-in appointments available Monday - Friday 8am -3pm.          [address] [place], [place] [address] [phone] or crisis line- [phone]  [facility]/ Intensive Outpatient Therapy Program [address] [place], [place] [address] [phone]  [facility]                  Crisis Services      [phone]      [address]     [place], [place] [address]                 [facility]   [facility] [phone] [address] [place], [place] [address]   [facility]          [address] [place],  [place], [place] [address]       [phone]  [facility] [address] [place], [place] [address] [phone]  [facility]    [address]    [place], [place] [address]     [phone]       Andee PolesParish McKinney, MD     [address]     [place] [address]     [phone]       [facility] [address] [place] [address]  [facility]     [address]     [place], [place]      [phone]       [facility] Eulogio DitchShamsher Ahluwalia, MD [address] [place], [place] [address] [phone]  [facility]     [address]     [place], [place] [address]     [phone]       [facility] [address] [place], [place] [address] [phone] Resources for Temporary Residential Assistance/Crisis Centers  [facility]) M-F 8am-3pm   [address] [place], [place] [address]   [phone] Services include: laundry, barbering, support groups, case management, phone  & computer access, showers, AA/NA mtgs, mental health/substance abuse nurse, job skills class, disability information, VA assistance, spiritual classes, etc.   HOMELESS SHELTERS  [facility]     [facility]   [address], [place]     [phone]              [facility] (women and children)       [address] [place], [place] [address] [phone] [email or web address] for application and process Application Required  [facility]   [address]    [place] [address]     [phone]  [place], [place] [address] [phone]([email or web address] application appt.) Application Required  [facility] (women only)    [address]     [place], [place] [address]     [phone]      Intake starts 6pm daily Need valid ID, SSC, & Police  report [employer] [address] [place], [place] [phone] Application Required  [employer] (men only)     [place]      [place], [place]       [facility] (Pregnant women only) [address] [place], [place]  [facility]      [place] [address]      [place], [place] [address]     [phone]             [facility] [address] [place], [place] 90 day commitment/SA/Application process  [facility](men only)     [address]     [place], [place]       Check-in at [facility] [address] [place], [place] [address] [phone] Men/Women/Women and Children must be there by 7 pm  [place], [place]

## 2016-12-21 NOTE — ED Notes (Signed)
Pt refused shower. Sitter, EMT and RN all encouraged Pt to shower. Pt states he "will be discharged later today and will not need a shower". Pt had requested a shower earlier. Will attempt again after Pt naps.

## 2016-12-21 NOTE — ED Notes (Signed)
Patient signed for and given paperwork, belongings, and bus pass. Patient getting dressed and will be escorted out by staff.
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
## 2016-12-21 NOTE — ED Notes (Signed)
Lunch tray ordered 

## 2016-12-23 ENCOUNTER — Emergency Department (HOSPITAL_COMMUNITY)
Admission: EM | Admit: 2016-12-23 | Discharge: 2016-12-23 | Disposition: A | Discharge status: Other Acute Inpatient Hospital | Payer: Self-pay | Attending: Emergency Medicine | Admitting: Emergency Medicine

## 2016-12-23 ENCOUNTER — Inpatient Hospital Stay (HOSPITAL_COMMUNITY)
Admission: AD | Admit: 2016-12-23 | Discharge: 2016-12-28 | DRG: 885 | Disposition: A | Discharge status: Home / Self Care | Payer: No Typology Code available for payment source | Source: Intra-hospital | Attending: Psychiatry | Admitting: Psychiatry

## 2016-12-23 ENCOUNTER — Encounter (HOSPITAL_COMMUNITY): Payer: Self-pay

## 2016-12-23 ENCOUNTER — Encounter (HOSPITAL_COMMUNITY): Payer: Self-pay | Admitting: *Deleted

## 2016-12-23 DIAGNOSIS — F315 Bipolar disorder, current episode depressed, severe, with psychotic features: Secondary | ICD-10-CM | POA: Insufficient documentation

## 2016-12-23 DIAGNOSIS — F1410 Cocaine abuse, uncomplicated: Secondary | ICD-10-CM | POA: Diagnosis present

## 2016-12-23 DIAGNOSIS — G4700 Insomnia, unspecified: Secondary | ICD-10-CM | POA: Diagnosis present

## 2016-12-23 DIAGNOSIS — I10 Essential (primary) hypertension: Secondary | ICD-10-CM | POA: Diagnosis present

## 2016-12-23 DIAGNOSIS — F17210 Nicotine dependence, cigarettes, uncomplicated: Secondary | ICD-10-CM | POA: Diagnosis present

## 2016-12-23 DIAGNOSIS — Z91010 Allergy to peanuts: Secondary | ICD-10-CM | POA: Insufficient documentation

## 2016-12-23 DIAGNOSIS — Z79899 Other long term (current) drug therapy: Secondary | ICD-10-CM | POA: Diagnosis not present

## 2016-12-23 DIAGNOSIS — Z818 Family history of other mental and behavioral disorders: Secondary | ICD-10-CM

## 2016-12-23 DIAGNOSIS — J45909 Unspecified asthma, uncomplicated: Secondary | ICD-10-CM | POA: Diagnosis present

## 2016-12-23 DIAGNOSIS — F1490 Cocaine use, unspecified, uncomplicated: Secondary | ICD-10-CM | POA: Diagnosis not present

## 2016-12-23 DIAGNOSIS — Z881 Allergy status to other antibiotic agents status: Secondary | ICD-10-CM

## 2016-12-23 DIAGNOSIS — F1220 Cannabis dependence, uncomplicated: Secondary | ICD-10-CM | POA: Diagnosis present

## 2016-12-23 DIAGNOSIS — F419 Anxiety disorder, unspecified: Secondary | ICD-10-CM | POA: Diagnosis not present

## 2016-12-23 DIAGNOSIS — Z590 Homelessness: Secondary | ICD-10-CM | POA: Diagnosis not present

## 2016-12-23 DIAGNOSIS — Z21 Asymptomatic human immunodeficiency virus [HIV] infection status: Secondary | ICD-10-CM | POA: Diagnosis present

## 2016-12-23 DIAGNOSIS — F909 Attention-deficit hyperactivity disorder, unspecified type: Secondary | ICD-10-CM | POA: Insufficient documentation

## 2016-12-23 DIAGNOSIS — Z5181 Encounter for therapeutic drug level monitoring: Secondary | ICD-10-CM | POA: Insufficient documentation

## 2016-12-23 DIAGNOSIS — Z915 Personal history of self-harm: Secondary | ICD-10-CM | POA: Diagnosis not present

## 2016-12-23 DIAGNOSIS — R45851 Suicidal ideations: Secondary | ICD-10-CM | POA: Diagnosis present

## 2016-12-23 DIAGNOSIS — F259 Schizoaffective disorder, unspecified: Secondary | ICD-10-CM | POA: Diagnosis present

## 2016-12-23 DIAGNOSIS — Z880 Allergy status to penicillin: Secondary | ICD-10-CM

## 2016-12-23 DIAGNOSIS — Z888 Allergy status to other drugs, medicaments and biological substances status: Secondary | ICD-10-CM

## 2016-12-23 DIAGNOSIS — F250 Schizoaffective disorder, bipolar type: Principal | ICD-10-CM | POA: Diagnosis present

## 2016-12-23 LAB — COMPREHENSIVE METABOLIC PANEL
ALBUMIN: 4 g/dL (ref 3.5–5.0)
ALT: 26 U/L (ref 17–63)
AST: 38 U/L (ref 15–41)
Alkaline Phosphatase: 95 U/L (ref 38–126)
Anion gap: 6 (ref 5–15)
BILIRUBIN TOTAL: 0.3 mg/dL (ref 0.3–1.2)
BUN: 15 mg/dL (ref 6–20)
CO2: 27 mmol/L (ref 22–32)
Calcium: 9 mg/dL (ref 8.9–10.3)
Chloride: 105 mmol/L (ref 101–111)
Creatinine, Ser: 0.99 mg/dL (ref 0.61–1.24)
GFR calc Af Amer: >60 mL/min (ref >60)
GFR calc non Af Amer: >60 mL/min (ref >60)
Glucose, Bld: 93 mg/dL (ref 65–99)
Potassium: 3.8 mmol/L (ref 3.5–5.1)
Sodium: 138 mmol/L (ref 135–145)
TOTAL PROTEIN: 7.3 g/dL (ref 6.5–8.1)

## 2016-12-23 LAB — RAPID URINE DRUG SCREEN, HOSP PERFORMED
Amphetamines: NOT DETECTED
BENZODIAZEPINES: NOT DETECTED
Barbiturates: NOT DETECTED
COCAINE: POSITIVE — AB
Opiates: NOT DETECTED
Tetrahydrocannabinol: NOT DETECTED

## 2016-12-23 LAB — CBC
HCT: 39.8 % (ref 39.0–52.0)
Hemoglobin: 13.2 g/dL (ref 13.0–17.0)
MCH: 28 pg (ref 26.0–34.0)
MCHC: 33.2 g/dL (ref 30.0–36.0)
MCV: 84.3 fL (ref 78.0–100.0)
PLATELETS: 341 10*3/uL (ref 150–400)
RBC: 4.72 MIL/uL (ref 4.22–5.81)
RDW: 14 % (ref 11.5–15.5)
WBC: 7.9 10*3/uL (ref 4.0–10.5)

## 2016-12-23 LAB — URINALYSIS, ROUTINE W REFLEX MICROSCOPIC
BILIRUBIN URINE: NEGATIVE
Glucose, UA: NEGATIVE mg/dL
Hgb urine dipstick: NEGATIVE
Ketones, ur: NEGATIVE mg/dL
Leukocytes, UA: NEGATIVE
NITRITE: NEGATIVE
PROTEIN: NEGATIVE mg/dL
SPECIFIC GRAVITY, URINE: 1.017 (ref 1.005–1.030)
pH: 6 (ref 5.0–8.0)

## 2016-12-23 LAB — ETHANOL

## 2016-12-23 LAB — CBG MONITORING, ED: Glucose-Capillary: 81 mg/dL (ref 65–99)

## 2016-12-23 LAB — ACETAMINOPHEN LEVEL

## 2016-12-23 LAB — PROTIME-INR
INR: 1.02
Prothrombin Time: 13.4 seconds (ref 11.4–15.2)

## 2016-12-23 LAB — SALICYLATE LEVEL: Salicylate Lvl: <7 mg/dL (ref 2.8–30.0)

## 2016-12-23 MED ORDER — BENZTROPINE MESYLATE 1 MG PO TABS
1.0000 mg | ORAL_TABLET | Freq: Every day | ORAL | Status: DC
  Administered 2016-12-23: 1 mg via ORAL
  Filled 2016-12-23: qty 1

## 2016-12-23 MED ORDER — TRAZODONE HCL 100 MG PO TABS
100.0000 mg | ORAL_TABLET | Freq: Every evening | ORAL | Status: DC | PRN
  Filled 2016-12-23 (×6): qty 1

## 2016-12-23 MED ORDER — OLANZAPINE 5 MG PO TBDP
5.0000 mg | ORAL_TABLET | Freq: Two times a day (BID) | ORAL | Status: DC
  Administered 2016-12-23 (×2): 5 mg via ORAL
  Filled 2016-12-23 (×2): qty 1

## 2016-12-23 MED ORDER — CARBAMAZEPINE 200 MG PO TABS
200.0000 mg | ORAL_TABLET | Freq: Two times a day (BID) | ORAL | Status: DC
Start: 2016-12-23 — End: 2016-12-23
  Administered 2016-12-23: 200 mg via ORAL
  Filled 2016-12-23: qty 1

## 2016-12-23 MED ORDER — RISPERIDONE 2 MG PO TBDP: 2.0000 mg | ORAL_TABLET | Freq: Three times a day (TID) | ORAL | Status: DC | PRN

## 2016-12-23 MED ORDER — ZIPRASIDONE MESYLATE 20 MG IM SOLR: 20.0000 mg | INTRAMUSCULAR | Status: DC | PRN

## 2016-12-23 MED ORDER — SODIUM CHLORIDE 0.9 % IV BOLUS (SEPSIS)
1000.0000 mL | Freq: Once | INTRAVENOUS | Status: AC
  Administered 2016-12-23: 1000 mL via INTRAVENOUS

## 2016-12-23 MED ORDER — CITALOPRAM HYDROBROMIDE 10 MG PO TABS
20.0000 mg | ORAL_TABLET | Freq: Every day | ORAL | Status: DC
  Administered 2016-12-23: 20 mg via ORAL
  Filled 2016-12-23: qty 2

## 2016-12-23 MED ORDER — LORAZEPAM 1 MG PO TABS: 1.0000 mg | ORAL_TABLET | ORAL | Status: DC | PRN

## 2016-12-23 NOTE — ED Provider Notes (Signed)
[facility] DEPT Provider Note   CSN: [id number] Arrival date & time: 12/23/16  4098     History   Chief Complaint Chief Complaint  Patient presents with  . Drug Overdose    Tylenol + Excedrine    HPI Edwin Martinez is a 31 y.o. male.  Patient is a 31 year old male with past medical history of ADHD, anxiety, HIV disease, bipolar, schizophrenia. He presents for evaluation of overdose. He tells me he has been hearing voices telling him to harm himself and that he is no good. He took an unknown quantity of Tylenol and Excedrin, then called EMS. This ingestion occurred at around 3:30 this morning. He denies any chest pain, difficulty breathing, abdominal pain, or other issues. He denies alcohol consumption and denies any illicit drug use.   The history is provided by the patient.  Drug Overdose  This is a new problem. The current episode started 1 to 2 hours ago. The problem occurs constantly. The problem has not changed since onset.Pertinent negatives include no chest pain and no abdominal pain. Nothing aggravates the symptoms. Nothing relieves the symptoms. He has tried nothing for the symptoms.    Past Medical History:  Diagnosis Date  . ADHD (attention deficit hyperactivity disorder)   . ADHD (attention deficit hyperactivity disorder) 09/12/2012  . Anxiety   . Asthma   . Bipolar 1 disorder (HCC)   . Bipolar disorder (HCC)   . Epileptic seizures (HCC)   . HIV (human immunodeficiency virus infection) (HCC)   . Hypertension   . Schizophrenia (HCC)   . Seizures [facility])     Patient Active Problem List   Diagnosis Date Noted  . Suicidal ideation   . Cocaine use disorder, mild, abuse 07/27/2016  . Cannabis use disorder, moderate, dependence (HCC) 07/27/2016  . Tobacco use disorder 07/27/2016  . Intentional drug overdose (HCC) 07/18/2016  . Bipolar disorder, current episode depressed, mild (HCC) 05/04/2016  . Asthma 05/20/2007  . Human immunodeficiency virus (HIV)  disease (HCC) 05/05/2007    Past Surgical History:  Procedure Laterality Date  . DENTAL SURGERY         Home Medications    Prior to Admission medications   Medication Sig Start Date End Date Taking? Authorizing Provider  benztropine (COGENTIN) 1 MG tablet Take 1 tablet (1 mg total) by mouth at bedtime. Patient not taking: Reported on 12/17/2016 08/22/16   Oneta Rack, NP  citalopram (CELEXA) 20 MG tablet Take 1 tablet (20 mg total) by mouth daily. Patient not taking: Reported on 12/17/2016 08/23/16   Oneta Rack, NP    Family History Family History  Problem Relation Age of Onset  . Huntington's disease Father   . Heart disease Mother   . Suicidality Maternal Uncle   . Suicidality Maternal Grandmother     Social History Social History  Substance Use Topics  . Smoking status: Current Every Day Smoker    Packs/day: 0.50    Types: Cigarettes    Start date: 09/29/1991  . Smokeless tobacco: Never Used  . Alcohol use 1.2 oz/week    2 Standard drinks or equivalent per week     Comment: once week      Allergies   Magnesium-containing compounds; Peanut-containing drug products; Esomeprazole magnesium; Atripla [efavirenz-emtricitab-tenofovir]; Bactrim [sulfamethoxazole-trimethoprim]; and Penicillins   Review of Systems Review of Systems  Cardiovascular: Negative for chest pain.  Gastrointestinal: Negative for abdominal pain.  All other systems reviewed and are negative.    Physical Exam Updated Vital  Signs BP 128/77 (BP Location: Right Arm)   Pulse 72   Temp 97.3 F (36.3 C) (Oral)   Resp 16   SpO2 100%   Physical Exam  Constitutional: He is oriented to person, place, and time. He appears well-developed and well-nourished. No distress.  HENT:  Head: Normocephalic and atraumatic.  Mouth/Throat: Oropharynx is clear and moist.  Eyes: EOM are normal. Pupils are equal, round, and reactive to light.  Neck: Normal range of motion. Neck supple.  Cardiovascular:  Normal rate and regular rhythm.  Exam reveals no friction rub.   No murmur heard. Pulmonary/Chest: Effort normal and breath sounds normal. No respiratory distress. He has no wheezes. He has no rales.  Abdominal: Soft. Bowel sounds are normal. He exhibits no distension. There is no tenderness.  Musculoskeletal: Normal range of motion. He exhibits no edema.  Neurological: He is alert and oriented to person, place, and time. No cranial nerve deficit. Coordination normal.  Skin: Skin is warm and dry. He is not diaphoretic.  Nursing note and vitals reviewed.    ED Treatments / Results  Labs (all labs ordered are listed, but only abnormal results are displayed) Labs Reviewed  COMPREHENSIVE METABOLIC PANEL  ETHANOL  SALICYLATE LEVEL  ACETAMINOPHEN LEVEL  CBC  RAPID URINE DRUG SCREEN, HOSP PERFORMED  PROTIME-INR  URINALYSIS, ROUTINE W REFLEX MICROSCOPIC  CBG MONITORING, ED    EKG  EKG Interpretation  Date/Time:  Wednesday December 23 2016 04:35:48 EDT Ventricular Rate:  69 PR Interval:    QRS Duration: 91 QT Interval:  387 QTC Calculation: 415 R Axis:   74 Text Interpretation:  Sinus rhythm ST elev, probable normal early repol pattern Confirmed by Gini Caputo  MD, Velva Molinari ([id number]) on 12/23/2016 4:42:38 AM       Radiology No results found.  Procedures Procedures (including critical care time)  Medications Ordered in ED Medications  sodium chloride 0.9 % bolus 1,000 mL (not administered)     Initial Impression / Assessment and Plan / ED Course  I have reviewed the triage vital signs and the nursing notes.  Pertinent labs & imaging results that were available during my care of the patient were reviewed by me and considered in my medical decision making (see chart for details).  Patient presents here after reporting to have overdosed on Tylenol and Excedrin. Initial Tylenol level was less than 5 and repeat at 4 hours was less than 5 as well. Laboratory studies are unremarkable. The  patient reports hearing voices that are telling him to harm himself and that he is "no good".  He will undergo consultation by TTS who will determine the final disposition. Care signed out to the oncoming provider at shift change.  Final Clinical Impressions(s) / ED Diagnoses   Final diagnoses:  None    New Prescriptions New Prescriptions   No medications on file     Geoffery Lyonsouglas Denecia Brunette, MD 12/23/16 0730

## 2016-12-23 NOTE — ED Notes (Signed)
Pt made aware of need for urine sample.  

## 2016-12-23 NOTE — ED Triage Notes (Signed)
B[place] [facility] [place], pt reports he took x200 Tylenol tablets and x60 Excedrine tablets at 0330. No pill bottles found when EMS arrived. Pt has hx of attempted suicide and drug overdose. Pt expressing hopelessness and requesting psychiatric resources. Pt reports recent psych med changes. Pt A+OX4, speaking in complete sentences, denies pain.   EMS Vitals BP 124/82 P 72 RR 20  SPO2 98% on RA CBG 104

## 2016-12-23 NOTE — ED Notes (Signed)
Patient informed that a urine specimen was needed. 

## 2016-12-23 NOTE — ED Notes (Signed)
[facility].  I was asked to please hold off on sending the patient after the change of shift.  Patient is currently resting and has not been a problem.

## 2016-12-23 NOTE — ED Notes (Signed)
Patient asleep.  Sitter aware that patient needs urine sample.

## 2016-12-23 NOTE — ED Notes (Signed)
Pt denies n/v

## 2016-12-23 NOTE — ED Notes (Signed)
Bed: RESA Expected date:  Expected time:  Means of arrival:  Comments: Overdose on tylenol and excedrin

## 2016-12-23 NOTE — BH Assessment (Signed)
[facility] Assessment Progress Note  Per Mojeed Akintayo, MD, this pt requires psychiatric hospitalization at this time.  Malva LimesLinsey Strader, RN, [facility] Thedore [facility]inshas [facility] Rm [facility]; they will be ready to receive pt at 18:00.  Pt has signed Voluntary Admission and Consent for Treatment, as well as Consent to Release Information to no one, and signed [facility].  Pt's nurse, Rudean HittDawnaly, has been notified, and agrees to send original paperwork along with pt via [facility], and to call report to [phone] when the time comes.  Doylene Canninghomas Christophor Eick, MA Triage Specialist [phone]

## 2016-12-23 NOTE — ED Notes (Signed)
[facility] called for follow up. Case to be closed.

## 2016-12-23 NOTE — ED Notes (Signed)
Pt sleeping at present, no distress noted, calm & cooperative.  Monitoring for safety, Q 15 min checks in effect.  Pending report to [facility] and [facility] transport. 

## 2016-12-23 NOTE — ED Notes (Addendum)
Patient pleasant on approach.  States he has been very depressed recently and has had thoughts of self harm recently.  On admission the patient had told staff that he took 200 acetaminophen tablets and 60 Excedrin tablets.  Because of this, he was given charcoal.  Shortly after he got here he came out of the bathroom and stated he had an accident and had stool on himself.  He was given supplies to shower and change.  He has since been lying quietly in his room.
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
## 2016-12-23 NOTE — ED Provider Notes (Signed)
[facility] by dr. Elna BreslowEappen. ED course reviewed. Vital stable. Medically cleared for ongoing psychiatric treatment   Edwin Guiseana Duo Daryus Sowash, MD 12/23/16 1949

## 2016-12-24 DIAGNOSIS — F250 Schizoaffective disorder, bipolar type: Principal | ICD-10-CM

## 2016-12-24 DIAGNOSIS — Z79899 Other long term (current) drug therapy: Secondary | ICD-10-CM

## 2016-12-24 DIAGNOSIS — R45851 Suicidal ideations: Secondary | ICD-10-CM

## 2016-12-24 LAB — LIPID PANEL
CHOL/HDL RATIO: 3.7 ratio
CHOLESTEROL: 128 mg/dL (ref 0–200)
HDL: 35 mg/dL — ABNORMAL LOW (ref >40)
LDL Cholesterol: 67 mg/dL (ref 0–99)
Triglycerides: 130 mg/dL (ref <150)
VLDL: 26 mg/dL (ref 0–40)

## 2016-12-24 LAB — TSH: TSH: 0.457 u[IU]/mL (ref 0.350–4.500)

## 2016-12-24 LAB — HIV-1 GENOTYPR PLUS

## 2016-12-24 MED ORDER — GABAPENTIN 300 MG PO CAPS: 300.0000 mg | ORAL_CAPSULE | Freq: Three times a day (TID) | ORAL | Status: DC

## 2016-12-24 MED ORDER — BUSPIRONE HCL 5 MG PO TABS
5.0000 mg | ORAL_TABLET | Freq: Two times a day (BID) | ORAL | Status: DC
  Administered 2016-12-24 – 2016-12-28 (×8): 5 mg via ORAL
  Filled 2016-12-24 (×5): qty 1
  Filled 2016-12-24: qty 14
  Filled 2016-12-24 (×2): qty 1
  Filled 2016-12-24: qty 14
  Filled 2016-12-24 (×4): qty 1

## 2016-12-24 MED ORDER — FLUOXETINE HCL 10 MG PO CAPS
10.0000 mg | ORAL_CAPSULE | Freq: Every day | ORAL | Status: DC
Start: 2016-12-24 — End: 2016-12-28
  Administered 2016-12-24 – 2016-12-28 (×5): 10 mg via ORAL
  Filled 2016-12-24: qty 7
  Filled 2016-12-24 (×7): qty 1

## 2016-12-24 MED ORDER — HYDROXYZINE HCL 25 MG PO TABS: 25.0000 mg | ORAL_TABLET | Freq: Four times a day (QID) | ORAL | Status: DC | PRN

## 2016-12-24 MED ORDER — CITALOPRAM HYDROBROMIDE 10 MG PO TABS: 10.0000 mg | ORAL_TABLET | Freq: Every day | ORAL | Status: DC

## 2016-12-24 MED ORDER — BUSPIRONE HCL 10 MG PO TABS: 10.0000 mg | ORAL_TABLET | Freq: Three times a day (TID) | ORAL | Status: DC

## 2016-12-24 MED ORDER — RISPERIDONE 1 MG PO TABS
1.0000 mg | ORAL_TABLET | Freq: Two times a day (BID) | ORAL | Status: DC
  Administered 2016-12-24 – 2016-12-28 (×8): 1 mg via ORAL
  Filled 2016-12-24 (×4): qty 1
  Filled 2016-12-24: qty 14
  Filled 2016-12-24 (×3): qty 1
  Filled 2016-12-24: qty 14
  Filled 2016-12-24 (×4): qty 1

## 2016-12-24 MED ORDER — ALBUTEROL SULFATE HFA 108 (90 BASE) MCG/ACT IN AERS: 2.0000 | INHALATION_SPRAY | Freq: Four times a day (QID) | RESPIRATORY_TRACT | Status: DC | PRN

## 2016-12-24 MED ORDER — GABAPENTIN 100 MG PO CAPS
100.0000 mg | ORAL_CAPSULE | Freq: Three times a day (TID) | ORAL | Status: DC
  Administered 2016-12-24 – 2016-12-28 (×12): 100 mg via ORAL
  Filled 2016-12-24 (×6): qty 1
  Filled 2016-12-24: qty 21
  Filled 2016-12-24 (×2): qty 1
  Filled 2016-12-24: qty 21
  Filled 2016-12-24 (×6): qty 1
  Filled 2016-12-24: qty 21
  Filled 2016-12-24: qty 1

## 2016-12-24 MED ORDER — TRAZODONE HCL 50 MG PO TABS
50.0000 mg | ORAL_TABLET | Freq: Every evening | ORAL | Status: DC | PRN
  Administered 2016-12-24 – 2016-12-27 (×3): 50 mg via ORAL
  Filled 2016-12-24 (×3): qty 1
  Filled 2016-12-24: qty 7
  Filled 2016-12-24: qty 1

## 2016-12-24 NOTE — Tx Team (Signed)
Initial Treatment Plan 12/24/2016 12:34 AM Edwin Martinez RUE:[id number]    PATIENT STRESSORS: Financial difficulties Health problems Legal issue Medication change or noncompliance   PATIENT STRENGTHS: Average or above average intelligence Capable of independent living General fund of knowledge Motivation for treatment/growth   PATIENT IDENTIFIED PROBLEMS: Depression  HIV  Medication non-compliance  Risk for self harm  Crack cocaine    "Get back on my meds"  "Find a place to stay"  "Trying to get disability"     DISCHARGE CRITERIA:  Ability to meet basic life and health needs Adequate post-discharge living arrangements Improved stabilization in mood, thinking, and/or behavior Motivation to continue treatment in a less acute level of care Verbal commitment to aftercare and medication compliance  PRELIMINARY DISCHARGE PLAN: Attend aftercare/continuing care group Outpatient therapy Placement in alternative living arrangements  PATIENT/FAMILY INVOLVEMENT: This treatment plan has been presented to and reviewed with the patient, Edwin ClossEdward A Martinez, and/or family member.  The patient and family have been given the opportunity to ask questions and make suggestions.  Charlott HollerSpeagle, Vesna Kable Church, RN 12/24/2016, 12:34 AM

## 2016-12-24 NOTE — Progress Notes (Signed)
Recreation Therapy Notes  INPATIENT RECREATION THERAPY ASSESSMENT  Patient Details Name: Edwin Martinez MRN: [id number] DOB: 12-Oct-1985 Today's Date: 12/24/2016  Patient Stressors: Other (Comment) (Homelessness, being off meds)  Pt stated he was here for a suicide attempt.  Coping Skills:   Avoidance, Self-Injury, Exercise, Art/Dance, Talking, Music  Personal Challenges: Concentration, Decision-Making, Relationships, Stress Management  Leisure Interests (2+):  Music - Listen, Art - Draw, Individual - Reading, Social - Friends  Awareness of Community Resources:  Yes  Community Resources:  Other (Comment) ([facility], [name], [employer])  Current Use: Yes  Patient Strengths:  Personality; social skills  Patient Identified Areas of Improvement:  Stress; Self pitty  Current Recreation Participation:  "I try to all the time"  Patient Goal for Hospitalization:  "To get back on medication"  [place]  Current SI (including self-harm):  Yes (Rated an 8 out of 10; contract for safety)  Current HI:  No  Consent to Intern Participation: N/A   Caroll RancherMarjette Cordell Guercio, LRT/CTRS  Caroll RancherLindsay, Wen Merced A 12/24/2016, 12:41 PM

## 2016-12-24 NOTE — Progress Notes (Signed)
Pt has been up and out of his room this evening.  He attended evening karaoke group and reported that he enjoyed it.  He denies SI/HI/AVH.  He reports that he had a good day, and voices no needs or concerns at this time.  He is here to get back on his medications and wants help finding a place to stay.  He hopes to get his disability approved which will help him stay on his medications.  Pt has been pleasant and cooperative.  He makes his needs known to staff.  Support and encouragement offered.  Discharge plans are in process.  Safety maintained with q15 minute checks.

## 2016-12-24 NOTE — [facility] Suicide Risk Assessment (Addendum)
[facility]   Nursing information obtained from:  Patient, Review of record Demographic factors:  Male, Edwin PeachGay, lesbian, or bisexual orientation, Low socioeconomic status, Living alone, Unemployed Current Mental Status:  Self-harm thoughts Loss Factors:  Decline in physical health, Legal issues, Financial problems / change in socioeconomic status Historical Factors:  Prior suicide attempts, Family history of mental illness or substance abuse Risk Reduction Factors:  NA  Total Time spent with patient: 45 minutes  Principal Problem:  Bipolar Disorder , Depressed  Diagnosis:   Patient Active Problem List   Diagnosis Date Noted  . Schizoaffective disorder, bipolar type (HCC) [F25.0] 12/23/2016  . Suicidal ideation [R45.851]   . Cocaine use disorder, mild, abuse [F14.10] 07/27/2016  . Cannabis use disorder, moderate, dependence (HCC) [F12.20] 07/27/2016  . Tobacco use disorder [F17.200] 07/27/2016  . Intentional drug overdose (HCC) [T50.902A] 07/18/2016  . Bipolar disorder, current episode depressed, mild (HCC) [F31.31] 05/04/2016  . Asthma [J45.909] 05/20/2007  . Human immunodeficiency virus (HIV) disease (HCC) [B20] 05/05/2007    Continued Clinical Symptoms:  Alcohol Use Disorder Identification Test Final Score (AUDIT): 3 The "Alcohol Use Disorders Identification Test", Guidelines for Use in Primary Care, Second Edition.  World Science writerHealth Organization [facility](WHO). Score between 0-7:  no or low risk or alcohol related problems. Score between 8-15:  moderate risk of alcohol related problems. Score between 16-19:  high risk of alcohol related problems. Score 20 or above:  warrants further diagnostic evaluation for alcohol dependence and treatment.   CLINICAL FACTORS:  31 year old male , known to our unit from prior admissions , most recently 07/2016. At the time was diagnosed with Bipolar Disorder, depressed and Cocaine Abuse. At the time was stabilized and discharged on  Risperidone,Celexa, Triumeq .  Presented to ED reporting depression, suicidal ideations, with recent  thoughts of walking into traffic, and overdose on Acetaminophen and Excedrin . ( Admission acetaminophen serum level < 10) .  He attributes depression partly to severe psychosocial stressors, mainly homelessness and lack of social support system.  He has not been taking psychiatric medications or antiretroviral medications  recently . Admission UDS (+) for Cocaine   Psychiatric history - has been diagnosed with Bipolar Disorder, history of multiple suicidal attempts in the past . Of note, patient states most effective medications he has been on in the past are Neurontin, Buspar, Prozac. Does not remember having had any side effects.  Substance Abuse History- Cocaine Abuse- denies alcohol abuse .   Medical history - HIV (+) , history of seizures  Social History- homeless , unemployed, limited support system  Dx- Bipolar Disorder, Depressed. Cocaine Abuse   Plan - inpatient admission Restart Risperidone 1 mgr BID,   Change Neurontin to 100 mgrs TID   Change Buspar to 5 mgrs BID Start Prozac 10 mgrs QDAY   ( restart medications at lower dose initially, as has not been taking recently, in order to decrease side effect risk, titrate gradually as tolerated ) Patient states he has scheduled appt in 1-2 weeks with ID to restart antiretroviral medication regimen.   Musculoskeletal: Strength & Muscle Tone: within normal limits Gait & Station: gait not examined Patient leans: N/A  Psychiatric Specialty Exam: Physical Exam  ROS denies chest pain, no shortness of breath, no fever, no chills  Blood pressure 118/70, pulse 79, temperature 97.9 F (36.6 C), resp. rate 16, height 5\' 7"  (1.702 m), weight 67.6 kg (149 lb).Body mass index is 23.34 kg/m.  General Appearance: Fairly Groomed  Eye Contact:  Fair  Speech:  Slow  Volume:  Decreased  Mood:  Depressed  Affect:  constricted and slightly  irritable   Thought Process:  Linear and Descriptions of Associations: Intact  Orientation:  Other:  fully alert and attentive  Thought Content:  no delusions expressed , no current hallucinations, does not appear internally preoccupied   Suicidal Thoughts:  Yes.  without intent/plan- contracts for safety on unit, denies current suicidal plan or intention   Homicidal Thoughts:  No  Memory:  recent and remote grossly intact   Judgement:  Fair  Insight:  Fair  Psychomotor Activity:  Decreased  Concentration:  Concentration: Fair and Attention Span: Fair  Recall:  Good  Fund of Knowledge:  Good  Language:  Fair  Akathisia:  Negative  Handed:  Right  AIMS (if indicated):     Assets:  Desire for Improvement Resilience  ADL's:  Fair   Cognition:  WNL  Sleep:         COGNITIVE FEATURES THAT CONTRIBUTE TO RISK:  Closed-mindedness and Loss of executive function    SUICIDE RISK:   Moderate:  Frequent suicidal ideation with limited intensity, and duration, some specificity in terms of plans, no associated intent, good self-control, limited dysphoria/symptomatology, some risk factors present, and identifiable protective factors, including available and accessible social support.  PLAN OF CARE: Patient will be admitted to inpatient psychiatric unit for stabilization and safety. Will provide and encourage milieu participation. Provide medication management and maked adjustments as needed.  Will follow daily.    I certify that inpatient services furnished can reasonably be expected to improve the patient's condition.   Craige Cotta, MD 12/24/2016, 2:30 PM

## 2016-12-24 NOTE — Progress Notes (Signed)
DAR NOTE: Pt present with flat affect and depressed mood in the unit. Pt has been isolating himself and has been bed most of the time. Pt denies physical pain, took all his meds as scheduled. As per self inventory, pt had a good night sleep, good appetite, normal energy, and poor concentration. Pt rate depression at 9, hopeless ness at 8, and anxiety at 8. Pt's safety ensured with 15 minute and environmental checks. Pt currently denies SI/HI and A/V hallucinations. Pt verbally agrees to seek staff if SI/HI or A/VH occurs and to consult with staff before acting on these thoughts. Will continue POC.

## 2016-12-24 NOTE — Progress Notes (Signed)
Recreation Therapy Notes  Date: 12/24/16 Time: 1000 Location: [address] Hall Dayroom  Group Topic: Communication, Team Building, Problem Solving  Goal Area(s) Addresses:  Patient will effectively work with peer towards shared goal.  Patient will identify skills used to make activity successful.  Patient will identify how skills used during activity can be used to reach post d/c goals.   Intervention: STEM Activity  Activity: Landing Pad. In teams patients were given 12 plastic drinking straws and a length of masking tape. Using the materials provided patients were asked to build a landing pad to catch a golf ball dropped from approximately 6 feet in the air.   Education: Social Skills, Discharge Planning   Education Outcome: Acknowledges education/In group clarification offered/Needs additional education.   Clinical Observations/Feedback: Pt did not attend group.   Whalen Trompeter, LRT/CTRS         Dlisa Barnwell A 12/24/2016 12:33 PM 

## 2016-12-24 NOTE — Progress Notes (Signed)
Vol admit, 31 yo AA male, [facility] on [facility]ylen[facility] and Excedrin.  He spent several days in the hospital and then [facility] his continued suicidal ideation and depression.  Pt states suicidal thoughts come and go.  He also hears voices telling him to kill himself.  He contracts for safety while in the hospital.  Pt is HIV+.  He also has a hx seizures, HTN, asthma, and ADHD.  Pt states he has been off his meds for over a month.  He is homeless and has been sleeping wherever he can find a place.  Pt was pleasant and cooperative with the admission process.  All paperwork was signed and search completed.  Pt was given a meal and re-oriented to the unit [facility].  Safety checks q15 minutes were initiated.

## 2016-12-24 NOTE — H&P (Signed)
Psychiatric Admission Assessment Adult  Patient Identification: Edwin Martinez  MRN:  [id number]  Date of Evaluation:  12/24/2016  Chief Complaint: Suicidal ideations.  Principal Diagnosis: Schizoaffective disorder, Bipolar-type.  Diagnosis:   Patient Active Problem List   Diagnosis Date Noted  . Schizoaffective disorder, bipolar type (HCC) [F25.0] 12/23/2016  . Suicidal ideation [R45.851]   . Cocaine use disorder, mild, abuse [F14.10] 07/27/2016  . Cannabis use disorder, moderate, dependence (HCC) [F12.20] 07/27/2016  . Tobacco use disorder [F17.200] 07/27/2016  . Intentional drug overdose (HCC) [T50.902A] 07/18/2016  . Bipolar disorder, current episode depressed, mild (HCC) [F31.31] 05/04/2016  . Asthma [J45.909] 05/20/2007  . Human immunodeficiency virus (HIV) disease (HCC) [B20] 05/05/2007   History of Present Illness: This is one of numerous admission assessment in the [place] systems for this 31 year old African-American male with hx of mental illness. He is also HIV positive & has Hx of drug use. He is currently being admitted to the hospital for complaints of auditory hallucinations telling him to kill himself. During this assessment, Edwin Martinez reports, "The EMS took me to the ED. I called them because I have been having suicidal thoughts x 1 week. The suicidal thoughts worsened last night. I'm homeless & also trying to find the right medicine for my mental illness. I have had Bipolar disorder since 2001. I also suffer from anxiety & insomnia. I have not been on my medications in 2 months. The doctors at [facility] would not give me the medicines that work for me. They only give me what they want & feel like giving me. The medications that work for me include; Buspar, Depakote, Prozac, Gabapentin & Trazodone. I attempted to overdose on a bottle of Tylenol & Excedrin few days ago. This is one of my many attempts. I do have an appoint at the infectious clinic on 01-07-17 for my HIV  infection. I need to re-start my HIV medicines again".  Associated Signs/Symptoms:  Depression Symptoms:  depressed mood, insomnia, feelings of worthlessness/guilt, hopelessness, suicidal thoughts without plan, suicidal attempt,  (Hypo) Manic Symptoms:  Impulsivity, Labiality of Mood,  Anxiety Symptoms:  Excessive Worry,  Psychotic Symptoms:  Hallucinations: Auditory  PTSD Symptoms: Denies any PTSD symptoms or events.   Total Time spent with patient: 1 hour  Past Psychiatric History: Hx of Bipolar disorder , was admitted to [facility] in the past, hx of [facility], [facility] IP admissions, hx of over 3 suicide attempts - most recent by OD 2 days ago. Pt reports he follows up with [facility] here in [place], [place].  Is the patient at risk to self? Yes.    Has the patient been a risk to self in the past 6 months? Yes.    Has the patient been a risk to self within the distant past? Yes.    Is the patient a risk to others? No.  Has the patient been a risk to others in the past 6 months? No.  Has the patient been a risk to others within the distant past? No.   Prior Inpatient Therapy: [facility] x numerous times, [facility], [facility].  Prior Outpatient Therapy: Yes, [name].  Alcohol Screening: 1. How often do you have a drink containing alcohol?: 2 to 3 times a week 2. How many drinks containing alcohol do you have on a typical day when you are drinking?: 1 or 2 3. How often do you have six or more drinks on one occasion?: Never Preliminary Score: 0 9. Have you or someone else been  injured as a result of your drinking?: No 10. Has a relative or friend or a doctor or another health worker been concerned about your drinking or suggested you cut down?: No Alcohol Use Disorder Identification Test Final Score (AUDIT): 3 Brief Intervention: AUDIT score less than 7 or less-screening does not suggest unhealthy drinking-brief intervention not indicated  Substance Abuse History in the last 12 months:  Yes.  hx  of Cocaine, Cannabis dependence. UDS positive for Cocaine   Consequences of Substance Abuse: Medical Consequences:  Liver damage, Possible death by overdose Legal Consequences:  Arrests, jail time, Loss of driving privilege. Family Consequences:  Family discord, divorce and or separation.  Previous Psychotropic Medications: Yes, (Depakote , Trazodone, Prozac).   Psychological Evaluations: No   Past Medical History:  Past Medical History:  Diagnosis Date  . ADHD (attention deficit hyperactivity disorder)   . ADHD (attention deficit hyperactivity disorder) 09/12/2012  . Anxiety   . Asthma   . Bipolar 1 disorder (HCC)   . Bipolar disorder (HCC)   . Epileptic seizures (HCC)   . HIV (human immunodeficiency virus infection) (HCC)   . Hypertension   . Schizophrenia (HCC)   . Seizures (HCC)     Past Surgical History:  Procedure Laterality Date  . DENTAL SURGERY     Family History:  Family History  Problem Relation Age of Onset  . Huntington's disease Father   . Heart disease Mother   . Suicidality Maternal Uncle   . Suicidality Maternal Grandmother    Family Psychiatric  History: Several family members committed suicide.  Tobacco Screening: Have you used any form of tobacco in the last 30 days? (Cigarettes, Smokeless Tobacco, Cigars, and/or Pipes): Yes Tobacco use, Select all that apply: 5 or more cigarettes per day Are you interested in Tobacco Cessation Medications?: No, patient refused Counseled patient on smoking cessation including recognizing danger situations, developing coping skills and basic information about quitting provided: Refused/Declined practical counseling  Social History:  History  Alcohol Use  . 1.2 oz/week  . 2 Standard drinks or equivalent per week    Comment: once week      History  Drug Use  . Types: Cocaine    Comment: smoked crack 12-15-16    Additional Social History: Pain Medications: See home med list Prescriptions: See  home med  list Over the Counter: See home med list History of alcohol / drug use?: Yes Longest period of sobriety (when/how long): UNKNOWN Negative Consequences of Use: Legal, Personal relationships, Financial Name of Substance 1: CRACK COCAINE 1 - Age of First Use: 22 1 - Amount (size/oz): VARIES 1 - Frequency: DAILY 1 - Duration: ONGOING 1 - Last Use / Amount: 12/14/16 Name of Substance 2: ETOH 2 - Age of First Use: 17 2 - Amount (size/oz): 40 oz 2 - Frequency: per week 2 - Duration: ongoing 2 - Last Use / Amount: 12/14/16 Name of Substance 3: NICOTINE 3 - Age of First Use: TEEN 3 - Amount (size/oz): 1/2 PACK 3 - Frequency: DAILY 3 - Duration: ONGOING 3 - Last Use / Amount: 12/15/16 Name of Substance 4: CANNABIS 4 - Age of First Use: 15 4 - Amount (size/oz): VARIES 4 - Frequency: VARIES 4 - Duration: ONGOING 4 - Last Use / Amount: MONTHS AGO  Allergies:   Allergies  Allergen Reactions  . Magnesium-Containing Compounds Other (See Comments)    This medication is contraindicated with pts HIV meds.    . Peanut-Containing Drug Products Anaphylaxis  . Esomeprazole  Magnesium Cough  . Atripla [Efavirenz-Emtricitab-Tenofovir] Other (See Comments)    Reaction:  Suicidal thoughts   . Bactrim [Sulfamethoxazole-Trimethoprim] Rash  . Penicillins Rash and Other (See Comments)    Has patient had a PCN reaction causing immediate rash, facial/tongue/throat swelling, SOB or lightheadedness with hypotension: Yes Has patient had a PCN reaction causing severe rash involving mucus membranes or skin necrosis: No Has patient had a PCN reaction that required hospitalization No Has patient had a PCN reaction occurring within the last 10 years: No If all of the above answers are "NO", then may proceed with Cephalosporin use.   Lab Results:  Results for orders placed or performed during the hospital encounter of 12/23/16 (from the past 48 hour(s))  TSH     Status: None   Collection Time: 12/24/16  6:09 AM   Result Value Ref Range   TSH 0.457 0.350 - 4.500 uIU/mL    Comment: Performed by a 3rd Generation assay with a functional sensitivity of <=0.01 uIU/mL. Performed at [facility], [address] [place], [place] [address]   Lipid panel     Status: Abnormal   Collection Time: 12/24/16  6:09 AM  Result Value Ref Range   Cholesterol 128 0 - 200 mg/dL   Triglycerides 469 <629 mg/dL   HDL 35 (L) >52 mg/dL   Total CHOL/HDL Ratio 3.7 RATIO   VLDL 26 0 - 40 mg/dL   LDL Cholesterol 67 0 - 99 mg/dL    Comment:        Total Cholesterol/HDL:CHD Risk Coronary Heart Disease Risk Table                     Men   Women  1/2 Average Risk   3.4   3.3  Average Risk       5.0   4.4  2 X Average Risk   9.6   7.1  3 X Average Risk  23.4   11.0        Use the calculated Patient Ratio above and the CHD Risk Table to determine the patient's CHD Risk.        ATP III CLASSIFICATION (LDL):  <100     mg/dL   Optimal  841-324  mg/dL   Near or Above                    Optimal  130-159  mg/dL   Borderline  401-027  mg/dL   High  >253     mg/dL   Very High Performed at [facility], [address] [place], [place] [address]    Blood Alcohol level:  Lab Results  Component Value Date   [facility] <5 12/23/2016   ETH <5 12/19/2016   Metabolic Disorder Labs:  Lab Results  Component Value Date   HGBA1C 5.5 05/04/2016   MPG 111 05/04/2016   Lab Results  Component Value Date   PROLACTIN 32.3 (H) 08/19/2016   Lab Results  Component Value Date   CHOL 128 12/24/2016   TRIG 130 12/24/2016   HDL 35 (L) 12/24/2016   CHOLHDL 3.7 12/24/2016   VLDL 26 12/24/2016   LDLCALC 67 12/24/2016   LDLCALC 75 08/19/2016   Current Medications: Current Facility-Administered Medications  Medication Dose Route Frequency Provider Last Rate Last Dose  . risperiDONE (RISPERDAL M-TABS) disintegrating tablet 2 mg  2 mg Oral Q8H PRN Kerry Hough, PA-C       And  .  LORazepam (ATIVAN) tablet 1 mg   1 mg Oral PRN Kerry HoughSpencer E Simon, PA-C       And  . ziprasidone (GEODON) injection 20 mg  20 mg Intramuscular PRN Kerry HoughSpencer E Simon, PA-C      . traZODone (DESYREL) tablet 100 mg  100 mg Oral QHS,MR X 1 Spencer E Simon, PA-C       PTA Medications: Prescriptions Prior to Admission  Medication Sig Dispense Refill Last Dose  . albuterol (PROVENTIL HFA;VENTOLIN HFA) 108 (90 Base) MCG/ACT inhaler Inhale 2 puffs into the lungs every 6 (six) hours as needed for wheezing or shortness of breath.   More than a month at Unknown time  . benztropine (COGENTIN) 1 MG tablet Take 1 tablet (1 mg total) by mouth at bedtime. (Patient not taking: Reported on 12/17/2016) 30 tablet 0 More than a month at Unknown time  . busPIRone (BUSPAR) 10 MG tablet Take 10 mg by mouth 3 (three) times daily.   More than a month at Unknown time  . citalopram (CELEXA) 20 MG tablet Take 1 tablet (20 mg total) by mouth daily. (Patient not taking: Reported on 12/17/2016) 30 tablet 0 More than a month at Unknown time  . gabapentin (NEURONTIN) 300 MG capsule Take 300 mg by mouth 3 (three) times daily.   More than a month at Unknown time  . hydrOXYzine (ATARAX/VISTARIL) 25 MG tablet Take 25 mg by mouth every 6 (six) hours as needed.   More than a month at Unknown time  . risperiDONE (RISPERDAL) 1 MG tablet Take 1 mg by mouth daily.   More than a month at Unknown time  . risperiDONE (RISPERDAL) 3 MG tablet Take 3 mg by mouth at bedtime.   More than a month at Unknown time  . traZODone (DESYREL) 50 MG tablet Take 50 mg by mouth at bedtime as needed for sleep.   More than a month at Unknown time   Musculoskeletal: Strength & Muscle Tone: within normal limits Gait & Station: normal Patient leans: N/A  Psychiatric Specialty Exam: Physical Exam  Constitutional: He is oriented to person, place, and time. He appears well-developed.  HENT:  Head: Normocephalic.  Eyes: Pupils are equal, round, and reactive to light.  Neck: Normal range of motion.   Cardiovascular: Normal rate.   Respiratory: Effort normal.  GI: Soft.  Genitourinary:  Genitourinary Comments: Denies any issues in this area  Musculoskeletal: Normal range of motion.  Neurological: He is alert and oriented to person, place, and time.  Skin: Skin is warm and dry.    Review of Systems  Constitutional: Negative.   HENT: Negative.   Eyes: Negative.   Respiratory: Negative.   Cardiovascular: Negative.   Gastrointestinal: Negative.   Genitourinary: Negative.   Musculoskeletal: Negative.   Skin: Negative.   Neurological: Negative.   Endo/Heme/Allergies: Negative.   Psychiatric/Behavioral: Positive for depression, substance abuse ( UDS (+) for Cannabis) and suicidal ideas. Negative for hallucinations and memory loss. The patient is nervous/anxious and has insomnia.   All other systems reviewed and are negative.   Blood pressure 118/70, pulse 79, temperature 97.9 F (36.6 C), resp. rate 16, height 5\' 7"  (1.702 m), weight 67.6 kg (149 lb).Body mass index is 23.34 kg/m.  General Appearance: Casual and Fairly Groomed  Eye Contact:  Fair  Speech:  Clear and Coherent and Normal Rate  Volume:  Normal  Mood:  Anxious, Depressed and Hopeless  Affect:  Congruent and Depressed  Thought Process:  Goal Directed and Descriptions  of Associations: Intact  Orientation:  Full (Time, Place, and Person)  Thought Content:  Ruminations, admits auditory hallucinations  Suicidal Thoughts:  Yes.  without intent/plan, able to contracts for safety safety on the unit, verbally.  Homicidal Thoughts:  Denies  Memory:  Immediate;   Fair Recent;   Fair Remote;   Fair  Judgement:  Fair  Insight:  Fair  Psychomotor Activity:  Normal  Concentration:  Concentration: Fair and Attention Span: Fair  Recall:  [employer] of Knowledge:  Fair  Language:  Good  Akathisia:  Negative  Handed:  Right  AIMS (if indicated):     Assets:  Communication Skills Desire for Improvement  ADL's:  Intact   Cognition:  WNL  Sleep:      Treatment Plan Summary: Daily contact with patient to assess and evaluate symptoms and progress in treatment and Medication management  Patient will benefit from inpatient treatment and stabilization: See [facility]   Estimated length of stay is 5-7 days.   Reviewed past medical records,treatment plan.   For HIV: Edwin Martinez reports that he has not been on his HIV medications in 1 month. He says he has appointment at the infectious disease clinic on 01-07-17 for evaluation & re-initiation of his HIV medications.   Will continue to monitor: Vitals, medication compliance and treatment side effects while a patient in this hospital.   Will monitor: For anymedical issues as well as call consult as needed.   Reviewed current lab results-   CSW will start working on disposition.   Patient to participate in therapeutic milieu .   Observation Level/Precautions:  15 minute checks  Lab: Per ED, UDS positive for Cocaine  Psychotherapy: Group counseling sessions.  Medications: See MAR  Consultations: As needed  Discharge Concerns: Mood stability,  safety    Lenght of stay: 5-7 days  Other: Admit to the [address].   Physician Treatment Plan for Primary Diagnosis: Will initiate medication management for mood stability. Set up an outpatient psychiatric services for medication management. Will encourage medication adherence with psychiatric medications.  Long Term Goal(s): Improvement in symptoms so as ready for discharge  Short Term Goals: Ability to disclose and discuss suicidal ideas, Compliance with prescribed medications will improve and Ability to identify triggers associated with substance abuse/mental health issues will improve  Physician Treatment Plan for Secondary Diagnosis: Active Problems:   Schizoaffective disorder, bipolar type (HCC)  Long Term Goal(s): Improvement in symptoms so as ready for discharge  Short Term Goals:  Ability to disclose and discuss suicidal ideas, Compliance with prescribed medications will improve and Ability to identify triggers associated with substance abuse/mental health issues will improve  I certify that inpatient services furnished can reasonably be expected to improve the patient's condition.    Sanjuana Kava, NP, PMHNP, FNP-BC. 3/29/201811:23 AM

## 2016-12-24 NOTE — BHH Counselor (Signed)
Adult Comprehensive Assessment  Patient ID: GARION WEMPE, male   DOB: 1986/08/06, 31 y.o.   MRN: [id number]   Information Source: Information source: Patient  Current Stressors:  Employment / Job issues: Pt is unemployed and has a pending disability claim. Family Relationships: Pt states "I don't have any family and my only brother is incarceratedEngineer, petroleum / Lack of resources (include bankruptcy):No income Housing / Lack of housing: Homeless Physical health (include injuries &life threatening diseases): HIV Social relationships : "None positive" Substance abuse: Cocaine Bereavement / Loss: n/a  Living/Environment/Situation:  Living Arrangements: Alone, homeless Living conditions (as described by patient or guardian): When he left here, he went to shelter for a month or so, left to reunite with boyfriend, and when that fell through, they would not let him back in shelter, so he has been staying in abandoned houses How long has patient lived in current situation?: couple of months What is atmosphere in current home: temporary  Family History:  Marital status: Single  He and boyfriend broke up about a month ago Are you sexually active?: Yes What is your sexual orientation?: homosexual Has your sexual activity been affected by drugs, alcohol, medication, or emotional stress?: n/a Does patient have children?: No  Childhood History:  By whom was/is the patient raised?: Mother/father and step-parent Additional childhood history information: Patient reports having a good childhood Description of patient's relationship with caregiver when they were a child: Good relationship with parents as a child Patient's description of current relationship with people who raised him/her: fine How were you disciplined when you got in trouble as a child/adolescent?: n/a Does patient have siblings?: Yes Number of Siblings: 1 Description of patient's current relationship with siblings: Good;  close relationship that is supportive, brother is incarcerated Did patient suffer from severe childhood neglect?: No Has patient ever been sexually abused/assaulted/raped as an adolescent or adult?: No Was the patient ever a victim of a crime or a disaster?: No Has patient been effected by domestic violence as an adult?: No  Education:  Highest grade of school patient has completed: 8th grade Currently a student?: No Learning disability?: No  Employment/Work Situation:  Employment situation: Unemployed Patient's job has been impacted by current illness: No What is the longest time patient has a held a job?: Six months Where was the patient employed at that time?: [employer] Has patient ever been in the [employer]?: No Has patient ever served in combat?: No Did You Receive Any Psychiatric Treatment/Services While in Equities trader?: No Are There Guns or Education officer, community in Your Home?: No Are These Comptroller?: (n/a)  Financial Resources:  Surveyor, quantity resources: No income, Sales executive, Income from spouse Does patient have a Lawyer or guardian?: No  Alcohol/Substance Abuse:  What has been your use of drugs/alcohol within the last 12 months?: Cocaine "every once in awhile" If attempted suicide, did drugs/alcohol play a role in this?: No Alcohol/Substance Abuse Treatment Hx: Denies past history Has alcohol/substance abuse ever caused legal problems?: No  Social Support System: Conservation officer, nature Support System: Production assistant, radio System: Boyfriend, Financial planner, and foster mom Type of faith/religion: n/a How does patient's faith help to cope with current illness?: n/a  Leisure/Recreation:  Leisure and Hobbies: Gardening, cooking, poetry and singing  Strengths/Needs:  What things does the patient do well?: communication and positiveoutlook In what areas does patient struggle / problems for patient: anxiety and depression  Discharge  Plan:  Does patient have access to transportation?: Yes  Will  patient be returning to same living situation after discharge?: No Unsure of plan-willing to consider [place] Currently receiving community mental health services: No [facility] Does patient have financial barriers related to discharge medications?: Yes  Patient description of barriers related to discharge medications:No income, no insurance    Summary/Recommendations:   Summary and Recommendations (to be completed by the evaluator): "[name]" is a 31 YO AA male diagnosed with Schizoaffective D/O, Bipolar type, and Cocaine Use.  He presents with SI, depression and complaints of AH.  [name] states he has been staying on the streets and not following up with [facility].  He is focused on being approved for disability "because all my problems will be solved if I have income."  While here, [facility] if they have IPRS openings.  In the meantime, he can benefit from crises stabilzation, medication mangement and therapeutic milieu.  Ida Rogueodney B Skarlett Sedlacek. 12/24/2016

## 2016-12-24 NOTE — B[facility] Group Notes (Signed)
Type of Therapy:  Group Therapy   Participation Level:  Engaged  Participation Quality:  Attentive  Affect:  Appropriate   Cognitive:  Alert   Insight:  Engaged  Engagement in Therapy:  Improving   Mode s of Intervention:  Education, Exploration, Socialization   Summary of Progress/Problems: Invited, chose not to attend.   Tammi from the [employer] was here to tell her story of recovery and inform patients about [facility] and their services.     

## 2016-12-25 LAB — PROLACTIN: Prolactin: 28.8 ng/mL — ABNORMAL HIGH (ref 4.0–15.2)

## 2016-12-25 LAB — HEMOGLOBIN A1C
Hgb A1c MFr Bld: 5.6 % (ref 4.8–5.6)
MEAN PLASMA GLUCOSE: 114 mg/dL

## 2016-12-25 MED ORDER — DIVALPROEX SODIUM ER 250 MG PO TB24
250.0000 mg | ORAL_TABLET | Freq: Every day | ORAL | Status: DC
  Administered 2016-12-25 – 2016-12-28 (×4): 250 mg via ORAL
  Filled 2016-12-25: qty 7
  Filled 2016-12-25 (×6): qty 1

## 2016-12-25 NOTE — BHH Suicide Risk Assessment (Signed)
B[facility] INPATIENT:  Family/Significant Other Suicide Prevention Education  Suicide Prevention Education:  Patient Refusal for Family/Significant Other Suicide Prevention Education: The patient Edwin Martinez has refused to provide written consent for family/significant other to be provided Family/Significant Other Suicide Prevention Education during admission and/or prior to discharge.  Physician notified.  Ida Rogue 12/25/2016, 3:59 PM

## 2016-12-25 NOTE — Progress Notes (Signed)
Did not attend group 

## 2016-12-25 NOTE — Progress Notes (Signed)
While Clinical research associate was doing an admission at 0500, MHT reported that pt had a BM in the bed while he was asleep.  MHT reported the BM had a strong odor.  Pt was able to get up and clean himself and change his bed linens after the mattress was cleaned.  Pt told Clinical research associate that this has happened before when he is in a deep sleep.  He stated he was fine and did not feel as if he had diarrhea.  Pt voiced no concerns about the incident.  Pt returned to bed after cleaning up.

## 2016-12-25 NOTE — B[facility] Group Notes (Signed)
B[facility] LCSW Group Therapy  12/25/2016  1:05 PM  Type of Therapy:  Group therapy  Participation Level:  Active  Participation Quality:  Attentive  Affect:  Flat  Cognitive:  Oriented  Insight:  Limited  Engagement in Therapy:  Limited  Modes of Intervention:  Discussion, Socialization  Summary of Progress/Problems:  Chaplain was here to lead a group on themes of hope and courage. Invited.  Chose to not attend.  Daryel Gerald B 12/25/2016 1:00 PM

## 2016-12-25 NOTE — Progress Notes (Addendum)
[facility] MD Progress Note  12/25/2016 11:05 AM Edwin Martinez  MRN:  [id number] Subjective:  Patient reports he is feeling partially better today. Denies medication side effects at this time. Objective : I have discussed case with treatment team and have met with patient . Staff notes indicate that -patient has continued to present depressed, isolative. Today, however, he  presents with an improving range of affect, and smiles briefly at times during session. He is more visible on unit. At this time denies medication side effects. No disruptive or agitated behaviors on unit . At this time denies any suicidal ideations and contracts for safety on unit .  Principal Problem: <principal problem not specified> Diagnosis:   Patient Active Problem List   Diagnosis Date Noted  . Schizoaffective disorder, bipolar type ([place]) [F25.0] 12/23/2016  . Suicidal ideation [R45.851]   . Cocaine use disorder, mild, abuse [F14.10] 07/27/2016  . Cannabis use disorder, moderate, dependence ([place]) [F12.20] 07/27/2016  . Tobacco use disorder [F17.200] 07/27/2016  . Intentional drug overdose ([place]) [T50.902A] 07/18/2016  . Bipolar disorder, current episode depressed, mild ([place]) [F31.31] 05/04/2016  . Asthma [J45.909] 05/20/2007  . Human immunodeficiency virus (HIV) disease ([place]) [B20] 05/05/2007   Total Time spent with patient: 20 minutes   Past Medical History:  Past Medical History:  Diagnosis Date  . ADHD (attention deficit hyperactivity disorder)   . ADHD (attention deficit hyperactivity disorder) 09/12/2012  . Anxiety   . Asthma   . Bipolar 1 disorder ([place])   . Bipolar disorder ([place])   . Epileptic seizures ([place])   . HIV (human immunodeficiency virus infection) ([place])   . Hypertension   . Schizophrenia ([place])   . Seizures ([place])     Past Surgical History:  Procedure Laterality Date  . DENTAL SURGERY     Family History:  Family History  Problem Relation Age of Onset  . Huntington's disease Father    . Heart disease Mother   . Suicidality Maternal Uncle   . Suicidality Maternal Grandmother    Social History:  History  Alcohol Use  . 1.2 oz/week  . 2 Standard drinks or equivalent per week    Comment: once week      History  Drug Use  . Types: Cocaine    Comment: smoked crack 12-15-16    Social History   Social History  . Marital status: Single    Spouse name: N/A  . Number of children: N/A  . Years of education: N/A   Social History Main Topics  . Smoking status: Current Every Day Smoker    Packs/day: 0.50    Types: Cigarettes    Start date: 09/29/1991  . Smokeless tobacco: Never Used  . Alcohol use 1.2 oz/week    2 Standard drinks or equivalent per week     Comment: once week   . Drug use: Yes    Types: Cocaine     Comment: smoked crack 12-15-16  . Sexual activity: Yes    Birth control/ protection: Condom   Other Topics Concern  . None   Social History Narrative   ** Merged History Encounter **       Additional Social History:    Pain Medications: See home med list Prescriptions: See  home med list Over the Counter: See home med list History of alcohol / drug use?: Yes Longest period of sobriety (when/how long): UNKNOWN Negative Consequences of Use: Legal, Personal relationships, Financial Name of Substance 1: CRACK COCAINE 1 - Age of First  Use: 22 1 - Amount (size/oz): VARIES 1 - Frequency: DAILY 1 - Duration: ONGOING 1 - Last Use / Amount: 12/14/16 Name of Substance 2: ETOH 2 - Age of First Use: 17 2 - Amount (size/oz): 40 oz 2 - Frequency: per week 2 - Duration: ongoing 2 - Last Use / Amount: 12/14/16 Name of Substance 3: NICOTINE 3 - Age of First Use: TEEN 3 - Amount (size/oz): 1/2 PACK 3 - Frequency: DAILY 3 - Duration: ONGOING 3 - Last Use / Amount: 12/15/16 Name of Substance 4: CANNABIS 4 - Age of First Use: 15 4 - Amount (size/oz): VARIES 4 - Frequency: VARIES 4 - Duration: ONGOING 4 - Last Use / Amount: MONTHS AGO  Sleep:  Good  Appetite:  Fair  Current Medications: Current Facility-Administered Medications  Medication Dose Route Frequency Provider Last Rate Last Dose  . albuterol (PROVENTIL HFA;VENTOLIN HFA) 108 (90 Base) MCG/ACT inhaler 2 puff  2 puff Inhalation Q6H PRN Encarnacion Slates, NP      . busPIRone (BUSPAR) tablet 5 mg  5 mg Oral BID Jenne Campus, MD   5 mg at 12/24/16 1734  . FLUoxetine (PROZAC) capsule 10 mg  10 mg Oral Daily Jenne Campus, MD   10 mg at 12/24/16 1734  . gabapentin (NEURONTIN) capsule 100 mg  100 mg Oral TID Jenne Campus, MD   100 mg at 12/24/16 1734  . hydrOXYzine (ATARAX/VISTARIL) tablet 25 mg  25 mg Oral Q6H PRN Encarnacion Slates, NP      . risperiDONE (RISPERDAL M-TABS) disintegrating tablet 2 mg  2 mg Oral Q8H PRN Laverle Hobby, PA-C       And  . LORazepam (ATIVAN) tablet 1 mg  1 mg Oral PRN Laverle Hobby, PA-C       And  . ziprasidone (GEODON) injection 20 mg  20 mg Intramuscular PRN Laverle Hobby, PA-C      . risperiDONE (RISPERDAL) tablet 1 mg  1 mg Oral BID Jenne Campus, MD   1 mg at 12/24/16 1734  . traZODone (DESYREL) tablet 50 mg  50 mg Oral QHS PRN Encarnacion Slates, NP   50 mg at 12/24/16 2152    Lab Results:  Results for orders placed or performed during the hospital encounter of 12/23/16 (from the past 48 hour(s))  Prolactin     Status: Abnormal   Collection Time: 12/24/16  6:09 AM  Result Value Ref Range   Prolactin 28.8 (H) 4.0 - 15.2 ng/mL    Comment: (NOTE) Performed At: [facility] [place], [place] [id number] Lindon Romp MD [id number] Performed at [facility], [place] [address] [place], [place] [address]   TSH     Status: None   Collection Time: 12/24/16  6:09 AM  Result Value Ref Range   TSH 0.457 0.350 - 4.500 uIU/mL    Comment: Performed by a 3rd Generation assay with a functional sensitivity of <=0.01 uIU/mL. Performed at [facility], [place] [address]  [place], [place] [address]   Hemoglobin A1c     Status: None   Collection Time: 12/24/16  6:09 AM  Result Value Ref Range   Hgb A1c MFr Bld 5.6 4.8 - 5.6 %    Comment: (NOTE)         Pre-diabetes: 5.7 - 6.4         Diabetes: >6.4         Glycemic control for adults  with diabetes: <7.0    Mean Plasma Glucose 114 mg/dL    Comment: (NOTE) Performed At: [facility] [place], [place] [id number] Lindon Romp MD AS:[id number] Performed at [facility], [place] [address], [place], [place] [address]   Lipid panel     Status: Abnormal   Collection Time: 12/24/16  6:09 AM  Result Value Ref Range   Cholesterol 128 0 - 200 mg/dL   Triglycerides 130 <150 mg/dL   HDL 35 (L) >40 mg/dL   Total CHOL/HDL Ratio 3.7 RATIO   VLDL 26 0 - 40 mg/dL   LDL Cholesterol 67 0 - 99 mg/dL    Comment:        Total Cholesterol/HDL:CHD Risk Coronary Heart Disease Risk Table                     Men   Women  1/2 Average Risk   3.4   3.3  Average Risk       5.0   4.4  2 X Average Risk   9.6   7.1  3 X Average Risk  23.4   11.0        Use the calculated Patient Ratio above and the CHD Risk Table to determine the patient's CHD Risk.        ATP III CLASSIFICATION (LDL):  <100     mg/dL   Optimal  100-129  mg/dL   Near or Above                    Optimal  130-159  mg/dL   Borderline  160-189  mg/dL   High  >190     mg/dL   Very High Performed at [place] [address] [place], [place] [address]     Blood Alcohol level:  Lab Results  Component Value Date   [facility] <5 12/23/2016   ETH <5 40/81/4481    Metabolic Disorder Labs: Lab Results  Component Value Date   HGBA1C 5.6 12/24/2016   MPG 114 12/24/2016   MPG 111 05/04/2016   Lab Results  Component Value Date   PROLACTIN 28.8 (H) 12/24/2016   PROLACTIN 32.3 (H) 08/19/2016   Lab Results  Component Value Date   CHOL 128 12/24/2016   TRIG 130 12/24/2016   HDL 35 (L) 12/24/2016   CHOLHDL 3.7  12/24/2016   VLDL 26 12/24/2016   LDLCALC 67 12/24/2016   LDLCALC 75 08/19/2016    Physical Findings: AIMS: Facial and Oral Movements Muscles of Facial Expression: None, normal Lips and Perioral Area: None, normal Jaw: None, normal Tongue: None, normal,Extremity Movements Upper (arms, wrists, hands, fingers): None, normal Lower (legs, knees, ankles, toes): None, normal, Trunk Movements Neck, shoulders, hips: None, normal, Overall Severity Severity of abnormal movements (highest score from questions above): None, normal Incapacitation due to abnormal movements: None, normal Patient's awareness of abnormal movements (rate only patient's report): No Awareness, Dental Status Current problems with teeth and/or dentures?: No Does patient usually wear dentures?: No  CIWA:    COWS:     Musculoskeletal: Strength & Muscle Tone: within normal limits Gait & Station: normal Patient leans: N/A  Psychiatric Specialty Exam: Physical Exam  ROS no headache, no chest pain, no shortness of breath, no vomiting   Blood pressure 124/68, pulse 81, temperature 98.4 F (36.9 C), temperature source Oral, resp. rate 16, height 5' 7"  (1.702 m), weight 67.6 kg (149 lb).Body mass index is 23.34  kg/m.  General Appearance: Fairly Groomed  Eye Contact:  improved today  Speech:  Normal Rate  Volume:  Decreased  Mood:  endorses ongoing depression, but states he is feeling better today  Affect:  less constricted, smiles at times during session  Thought Process:  Linear and Descriptions of Associations: Intact  Orientation:  Other:  fully alert and attentive   Thought Content:  denies hallucinations, no delusions expressed at this time  Suicidal Thoughts:  No denies any suicidal or self injurious ideations at this time, contracts for safety on unit   Homicidal Thoughts:  No denies homicidal or violent idetions  Memory:  recent and remote grossly intact   Judgement:  Fair- improving  Insight:  Fair   Psychomotor Activity:  more visible on unit today  Concentration:  Concentration: Good and Attention Span: Good  Recall:  Good  Fund of Knowledge:  Good  Language:  Good  Akathisia:  Negative  Handed:  Right  AIMS (if indicated):     Assets:  Desire for Improvement Resilience  ADL's:  Intact  Cognition:  WNL  Sleep:  Number of Hours: 6.25   Assessment - patient reports some improvement compared to admission presentation, and is presenting with a somewhat more reactive affect. Denies any active SI at this time, and contracts for safety on unit. Tolerating medications well. Of note, patient states he has responded best to DEPAKOTE in the past, and did not have side effects. He states he felt more stable while he was on this medication. Wants to restart.  Treatment Plan Summary: Daily contact with patient to assess and evaluate symptoms and progress in treatment, Medication management, Plan inpatient treatment  and medications as below Encourage group and milieu participation to work on coping skills and symptom reduction Continue Buspar 5 mgrs BID for anxiety Continue Prozac 10 mgrs QDAY for depression  Increase Neurontin to 200 mgrs TID for anxiety, pain  Continue Risperidone 1 mgr BID for psychosis, mood  Start Depakote ER 250 mgrs QDAY initially, for mood disorder - will start medication at low dose to minimize side effect risk D/C Vistaril PRNs , states he does not tolerate well  I have discussed case with ID consultant- due to most recent CD4 count, would not recommend restarting antiretroviral medication at this time, but to reinforce importance of keeping upcoming appointment at [facility] team working on disposition Linn [place], MD 12/25/2016, 11:05 AM

## 2016-12-25 NOTE — Plan of Care (Signed)
Problem: Safety: Goal: Periods of time without injury will increase Outcome: Progressing Pt safe on the unit at this time   

## 2016-12-25 NOTE — Progress Notes (Signed)
DAR NOTE: Patient presents with anxious affect and mood.  Reports suicidal thoughts, auditory and visual hallucinations.  Patient contracts for safety.  Described energy level as normal and concentration as good. Rates depression at 8, hopelessness at 5, and anxiety at 8.  Maintained on routine safety checks.  Medications given as prescribed.  Support and encouragement offered as needed.  States goal for today is "get on meds." Patient observed socializing with peers in the dayroom.  Offered no complaint.

## 2016-12-25 NOTE — Progress Notes (Signed)
Recreation Therapy Notes  Date: 12/25/16 Time: 1000 Location: 500 Hall Dayroom  Group Topic: Leisure Education  Goal Area(s) Addresses:  Patient will identify positive leisure activities.  Patient will identify one positive benefit of participation in leisure activities.   Intervention: Leisure activities, can, dry erase marker, dry erase board, eraser  Activity: Leisure Pictionary.  One patient would come up and draw a slip from the can containing a leisure activity on it.  The patient is to then draw the activity on the board.  The remaining patients are to try and guess the picture.  The person that guesses the picture will get the next turn.  Education:  Leisure Education, Building control surveyor  Education Outcome: Acknowledges education/In group clarification offered/Needs additional education  Clinical Observations/Feedback: Pt did not attend group.   Caroll Rancher, LRT/CTRS         Caroll Rancher A 12/25/2016 12:45 PM

## 2016-12-25 NOTE — Progress Notes (Signed)
D: Pt passive SI/ AH- contracts for safety. Pt is pleasant and cooperative. Pt stayed in his room most of the evening, but stated he was doing ok .   A: Pt was offered support and encouragement. Pt was given scheduled medications. Pt was encourage to attend groups. Q 15 minute checks were done for safety.   R:Pt attends groups and interacts well with peers and staff. Pt is taking medication. Pt has no complaints at this time.Pt receptive to treatment and safety maintained on unit.

## 2016-12-25 NOTE — Tx Team (Signed)
Interdisciplinary Treatment and Diagnostic Plan Update  12/25/2016 Time of Session: 8:37 AM  Edwin Martinez MRN: [id number]  Principal Diagnosis: <principal problem not specified>  Secondary Diagnoses: Active Problems:   Schizoaffective disorder, bipolar type (HCC)   Current Medications:  Current Facility-Administered Medications  Medication Dose Route Frequency Provider Last Rate Last Dose  . albuterol (PROVENTIL HFA;VENTOLIN HFA) 108 (90 Base) MCG/ACT inhaler 2 puff  2 puff Inhalation Q6H PRN Encarnacion Slates, NP      . busPIRone (BUSPAR) tablet 5 mg  5 mg Oral BID Jenne Campus, MD   5 mg at 12/24/16 1734  . FLUoxetine (PROZAC) capsule 10 mg  10 mg Oral Daily Jenne Campus, MD   10 mg at 12/24/16 1734  . gabapentin (NEURONTIN) capsule 100 mg  100 mg Oral TID Jenne Campus, MD   100 mg at 12/24/16 1734  . hydrOXYzine (ATARAX/VISTARIL) tablet 25 mg  25 mg Oral Q6H PRN Encarnacion Slates, NP      . risperiDONE (RISPERDAL M-TABS) disintegrating tablet 2 mg  2 mg Oral Q8H PRN Laverle Hobby, PA-C       And  . LORazepam (ATIVAN) tablet 1 mg  1 mg Oral PRN Laverle Hobby, PA-C       And  . ziprasidone (GEODON) injection 20 mg  20 mg Intramuscular PRN Laverle Hobby, PA-C      . risperiDONE (RISPERDAL) tablet 1 mg  1 mg Oral BID Jenne Campus, MD   1 mg at 12/24/16 1734  . traZODone (DESYREL) tablet 50 mg  50 mg Oral QHS PRN Encarnacion Slates, NP   50 mg at 12/24/16 2152    PTA Medications: Prescriptions Prior to Admission  Medication Sig Dispense Refill Last Dose  . albuterol (PROVENTIL HFA;VENTOLIN HFA) 108 (90 Base) MCG/ACT inhaler Inhale 2 puffs into the lungs every 6 (six) hours as needed for wheezing or shortness of breath.   More than a month at Unknown time  . benztropine (COGENTIN) 1 MG tablet Take 1 tablet (1 mg total) by mouth at bedtime. (Patient not taking: Reported on 12/17/2016) 30 tablet 0 More than a month at Unknown time  . busPIRone (BUSPAR) 10 MG tablet Take 10 mg  by mouth 3 (three) times daily.   More than a month at Unknown time  . citalopram (CELEXA) 20 MG tablet Take 1 tablet (20 mg total) by mouth daily. (Patient not taking: Reported on 12/17/2016) 30 tablet 0 More than a month at Unknown time  . gabapentin (NEURONTIN) 300 MG capsule Take 300 mg by mouth 3 (three) times daily.   More than a month at Unknown time  . hydrOXYzine (ATARAX/VISTARIL) 25 MG tablet Take 25 mg by mouth every 6 (six) hours as needed.   More than a month at Unknown time  . risperiDONE (RISPERDAL) 1 MG tablet Take 1 mg by mouth daily.   More than a month at Unknown time  . risperiDONE (RISPERDAL) 3 MG tablet Take 3 mg by mouth at bedtime.   More than a month at Unknown time  . traZODone (DESYREL) 50 MG tablet Take 50 mg by mouth at bedtime as needed for sleep.   More than a month at Unknown time    Treatment Modalities: Medication Management, Group therapy, Case management,  1 to 1 session with clinician, Psychoeducation, Recreational therapy.   Physician Treatment Plan for Primary Diagnosis: <principal problem not specified> Long Term Goal(s): Improvement in symptoms so as ready  for discharge  Short Term Goals: Ability to disclose and discuss suicidal ideas Compliance with prescribed medications will improve Ability to identify triggers associated with substance abuse/mental health issues will improve Ability to disclose and discuss suicidal ideas Compliance with prescribed medications will improve Ability to identify triggers associated with substance abuse/mental health issues will improve  Medication Management: Evaluate patient's response, side effects, and tolerance of medication regimen.  Therapeutic Interventions: 1 to 1 sessions, Unit Group sessions and Medication administration.  Evaluation of Outcomes: Progressing  Physician Treatment Plan for Secondary Diagnosis: Active Problems:   Schizoaffective disorder, bipolar type ([place])   Long Term Goal(s):  Improvement in symptoms so as ready for discharge  Short Term Goals: Ability to disclose and discuss suicidal ideas Compliance with prescribed medications will improve Ability to identify triggers associated with substance abuse/mental health issues will improve Ability to disclose and discuss suicidal ideas Compliance with prescribed medications will improve Ability to identify triggers associated with substance abuse/mental health issues will improve  Medication Management: Evaluate patient's response, side effects, and tolerance of medication regimen.  Therapeutic Interventions: 1 to 1 sessions, Unit Group sessions and Medication administration.  Evaluation of Outcomes: Progressing   RN Treatment Plan for Primary Diagnosis: <principal problem not specified> Long Term Goal(s): Knowledge of disease and therapeutic regimen to maintain health will improve  Short Term Goals: Ability to disclose and discuss suicidal ideas and Ability to identify and develop effective coping behaviors will improve  Medication Management: RN will administer medications as ordered by provider, will assess and evaluate patient's response and provide education to patient for prescribed medication. RN will report any adverse and/or side effects to prescribing provider.  Therapeutic Interventions: 1 on 1 counseling sessions, Psychoeducation, Medication administration, Evaluate responses to treatment, Monitor vital signs and CBGs as ordered, Perform/monitor CIWA, COWS, AIMS and Fall Risk screenings as ordered, Perform wound care treatments as ordered.  Evaluation of Outcomes: Progressing   Recreational Therapy Treatment Plan for Primary Diagnosis: <principal problem not specified> Long Term Goal(s): Patient will participate in recreation therapy treatment in at least 2 group sessions without prompting from LRT  Short Term Goals: Patient will be able to identify at least 5 coping skills for admitting diagnosis by  conclusion of recreation therapy treatment  Treatment Modalities: Group and Pet Therapy  Therapeutic Interventions: Psychoeducation  Evaluation of Outcomes: Progressing   LCSW Treatment Plan for Primary Diagnosis: <principal problem not specified> Long Term Goal(s): Safe transition to appropriate next level of care at discharge, Engage patient in therapeutic group addressing interpersonal concerns.  Short Term Goals: Engage patient in aftercare planning with referrals and resources  Therapeutic Interventions: Assess for all discharge needs, 1 to 1 time with Social worker, Explore available resources and support systems, Assess for adequacy in community support network, Educate family and significant other(s) on suicide prevention, Complete Psychosocial Assessment, Interpersonal group therapy.  Evaluation of Outcomes: Met  Unsure of where he will go at d/c, but is unconcerned.  [facility] at [facility].  Referral to [facility]   Progress in Treatment: Attending groups: Yes Participating in groups: Yes Taking medication as prescribed: Yes Toleration medication: Yes, no side effects reported at this time Family/Significant other contact made: No Patient understands diagnosis: Yes AEB asking for help with SI, depression, psychosis Discussing patient identified problems/goals with staff: Yes Medical problems stabilized or resolved: Yes Denies suicidal/homicidal ideation: Yes Issues/concerns per patient self-inventory: None Other: N/A  New problem(s) identified: None identified at this time.   New Short Term/Long  Term Goal(s): None identified at this time.   Discharge Plan or Barriers:   Reason for Continuation of Hospitalization:  Depression Hallucinations  Medication stabilization Suicidal ideation   Estimated Length of Stay: 3-5 days  Attendees: Patient: 12/25/2016  8:37 AM  Physician: Sinclair Ship, MD 12/25/2016  8:37 AM  Nursing: Jeanie Cooks, RN 12/25/2016  8:37 AM  RN  Care Manager: Lars Pinks, RN 12/25/2016  8:37 AM  Social Worker: Ripley Fraise 12/25/2016  8:37 AM  Recreational Therapist: Laretta Bolster  12/25/2016  8:37 AM  Other: Norberto Sorenson 12/25/2016  8:37 AM  Other:  12/25/2016  8:37 AM    Scribe for Treatment Team:  Roque Lias LCSW 12/25/2016 8:37 AM

## 2016-12-26 DIAGNOSIS — F419 Anxiety disorder, unspecified: Secondary | ICD-10-CM

## 2016-12-26 DIAGNOSIS — F17210 Nicotine dependence, cigarettes, uncomplicated: Secondary | ICD-10-CM

## 2016-12-26 DIAGNOSIS — F1490 Cocaine use, unspecified, uncomplicated: Secondary | ICD-10-CM

## 2016-12-26 NOTE — Progress Notes (Signed)
Patient ID: Edwin Martinez, male   DOB: 07-26-1986, 31 y.o.   MRN: [id number]    D: Pt has been appropriate on the unit today, he has attended all groups and engaged in treatment. Pt reported that he felt better and that he was having a good day. Pt requested to move back to the 400 hall as anytime that he has been at [facility] he had been on the 400 hall. Aggie NP made aware of situation, pt was moved back to the 400 hall. Pt reported that his depression was a 5, his hopelessness was a 0, and his anxiety was a 5. Pt reported that his goal for today was to get and stay on his meds. Pt reported being negative SI/HI, no AH/VH noted. A: 15 min checks continued for patient safety. R: Pt safety maintained.

## 2016-12-26 NOTE — [facility] Group Notes (Signed)
[facility] Group Notes:  (Clinical Social Work)  12/26/2016  11:15-12:00PM  Summary of Progress/Problems:   Today's process group involved patients discussing their feelings related to being hospitalized, as well as benefits they see to being in the hospital. We also talked about activities that various patients like to engage in when spring comes around, using that as a springboard to discussing how to stay well and in the community so that they can enjoy doing all their favorite things rather than coming back to the hospital. The patient expressed a primary feeling about being hospitalized is "encouraged" because he is learning what he needs to learn, and feels understood.  He was very involved in the whole conversation, giving positive feedback to other group members.  He was called out to see a provider for part of group.  Type of Therapy:  Group Therapy - Process  Participation Level:  Active  Participation Quality:  Appropriate, Attentive, Sharing and Supportive  Affect:  Appropriate  Cognitive:  Appropriate  Insight:  Engaged  Engagement in Therapy:  Engaged  Modes of Intervention:  Exploration, Discussion  Edwin Mantle, LCSW 12/26/2016, 1:28 PM

## 2016-12-26 NOTE — Progress Notes (Signed)
Writer has observed patient lying in his bed asleep resting since shift change. Writer called his name and he answered. He reported that he was just tired. Writer offerd him snack and he requested 3 vanilla pudding and denied having pain. Writer asked if in need of any medication and he declined. Writer continued to allow him to rest. Safety maintained wit 15 min checks.

## 2016-12-26 NOTE — Progress Notes (Signed)
[facility] MD Progress Note  12/26/2016 3:25 PM Edwin Martinez  MRN:  [id number]   Subjective: Edwin Martinez reports, I'm still hearing the voices, not as strong or pushy. The voices are telling me to sleep all the time".  Objective: Edwin Martinez is seen, chart reviewed. I have discussed case with treatment team. Patient is visible on the unit. He is participating in the group milieu. Although, he reports hearing voices, he  presents with an improving range of affect, and smiles briefly at times during session. At this time denies medication side effects. No disruptive or agitated behaviors on unit .  At this time denies any suicidal ideations and contracts for safety on unit. Continue to need encouragement & support.  Principal Problem: Schizoaffective disorder, Bipolar-type.  Diagnosis:   Patient Active Problem List   Diagnosis Date Noted  . Schizoaffective disorder, bipolar type (HCC) [F25.0] 12/23/2016  . Suicidal ideation [R45.851]   . Cocaine use disorder, mild, abuse [F14.10] 07/27/2016  . Cannabis use disorder, moderate, dependence (HCC) [F12.20] 07/27/2016  . Tobacco use disorder [F17.200] 07/27/2016  . Intentional drug overdose (HCC) [T50.902A] 07/18/2016  . Bipolar disorder, current episode depressed, mild (HCC) [F31.31] 05/04/2016  . Asthma [J45.909] 05/20/2007  . Human immunodeficiency virus (HIV) disease (HCC) [B20] 05/05/2007   Total Time spent with patient: 15 minutes  Past Medical History:  Past Medical History:  Diagnosis Date  . ADHD (attention deficit hyperactivity disorder)   . ADHD (attention deficit hyperactivity disorder) 09/12/2012  . Anxiety   . Asthma   . Bipolar 1 disorder (HCC)   . Bipolar disorder (HCC)   . Epileptic seizures (HCC)   . HIV (human immunodeficiency virus infection) (HCC)   . Hypertension   . Schizophrenia (HCC)   . Seizures (HCC)     Past Surgical History:  Procedure Laterality Date  . DENTAL SURGERY     Family History:  Family History   Problem Relation Age of Onset  . Huntington's disease Father   . Heart disease Mother   . Suicidality Maternal Uncle   . Suicidality Maternal Grandmother    Social History:  History  Alcohol Use  . 1.2 oz/week  . 2 Standard drinks or equivalent per week    Comment: once week      History  Drug Use  . Types: Cocaine    Comment: smoked crack 12-15-16    Social History   Social History  . Marital status: Single    Spouse name: N/A  . Number of children: N/A  . Years of education: N/A   Social History Main Topics  . Smoking status: Current Every Day Smoker    Packs/day: 0.50    Types: Cigarettes    Start date: 09/29/1991  . Smokeless tobacco: Never Used  . Alcohol use 1.2 oz/week    2 Standard drinks or equivalent per week     Comment: once week   . Drug use: Yes    Types: Cocaine     Comment: smoked crack 12-15-16  . Sexual activity: Yes    Birth control/ protection: Condom   Other Topics Concern  . None   Social History Narrative   ** Merged History Encounter **       Additional Social History:    Pain Medications: See home med list Prescriptions: See  home med list Over the Counter: See home med list History of alcohol / drug use?: Yes Longest period of sobriety (when/how long): UNKNOWN Negative Consequences of Use: Legal, Personal relationships, Financial  Name of Substance 1: CRACK COCAINE 1 - Age of First Use: 22 1 - Amount (size/oz): VARIES 1 - Frequency: DAILY 1 - Duration: ONGOING 1 - Last Use / Amount: 12/14/16 Name of Substance 2: ETOH 2 - Age of First Use: 17 2 - Amount (size/oz): 40 oz 2 - Frequency: per week 2 - Duration: ongoing 2 - Last Use / Amount: 12/14/16 Name of Substance 3: NICOTINE 3 - Age of First Use: TEEN 3 - Amount (size/oz): 1/2 PACK 3 - Frequency: DAILY 3 - Duration: ONGOING 3 - Last Use / Amount: 12/15/16 Name of Substance 4: CANNABIS 4 - Age of First Use: 15 4 - Amount (size/oz): VARIES 4 - Frequency: VARIES 4 -  Duration: ONGOING 4 - Last Use / Amount: MONTHS AGO  Sleep: Good  Appetite:  Fair  Current Medications: Current Facility-Administered Medications  Medication Dose Route Frequency Provider Last Rate Last Dose  . albuterol (PROVENTIL HFA;VENTOLIN HFA) 108 (90 Base) MCG/ACT inhaler 2 puff  2 puff Inhalation Q6H PRN Sanjuana Kava, NP      . busPIRone (BUSPAR) tablet 5 mg  5 mg Oral BID Craige Cotta, MD   5 mg at 12/26/16 1015  . divalproex (DEPAKOTE ER) 24 hr tablet 250 mg  250 mg Oral Daily Craige Cotta, MD   250 mg at 12/26/16 1015  . FLUoxetine (PROZAC) capsule 10 mg  10 mg Oral Daily Craige Cotta, MD   10 mg at 12/26/16 1015  . gabapentin (NEURONTIN) capsule 100 mg  100 mg Oral TID Craige Cotta, MD   100 mg at 12/26/16 1205  . risperiDONE (RISPERDAL M-TABS) disintegrating tablet 2 mg  2 mg Oral Q8H PRN Kerry Hough, PA-C       And  . LORazepam (ATIVAN) tablet 1 mg  1 mg Oral PRN Kerry Hough, PA-C       And  . ziprasidone (GEODON) injection 20 mg  20 mg Intramuscular PRN Kerry Hough, PA-C      . risperiDONE (RISPERDAL) tablet 1 mg  1 mg Oral BID Craige Cotta, MD   1 mg at 12/26/16 1015  . traZODone (DESYREL) tablet 50 mg  50 mg Oral QHS PRN Sanjuana Kava, NP   50 mg at 12/25/16 2229   Lab Results:  No results found for this or any previous visit (from the past 48 hour(s)).  Blood Alcohol level:  Lab Results  Component Value Date   ETH <5 12/23/2016   ETH <5 12/19/2016   Metabolic Disorder Labs: Lab Results  Component Value Date   HGBA1C 5.6 12/24/2016   MPG 114 12/24/2016   MPG 111 05/04/2016   Lab Results  Component Value Date   PROLACTIN 28.8 (H) 12/24/2016   PROLACTIN 32.3 (H) 08/19/2016   Lab Results  Component Value Date   CHOL 128 12/24/2016   TRIG 130 12/24/2016   HDL 35 (L) 12/24/2016   CHOLHDL 3.7 12/24/2016   VLDL 26 12/24/2016   LDLCALC 67 12/24/2016   LDLCALC 75 08/19/2016   Physical Findings: AIMS: Facial and Oral  Movements Muscles of Facial Expression: None, normal Lips and Perioral Area: None, normal Jaw: None, normal Tongue: None, normal,Extremity Movements Upper (arms, wrists, hands, fingers): None, normal Lower (legs, knees, ankles, toes): None, normal, Trunk Movements Neck, shoulders, hips: None, normal, Overall Severity Severity of abnormal movements (highest score from questions above): None, normal Incapacitation due to abnormal movements: None, normal Patient's awareness of abnormal movements (  rate only patient's report): No Awareness, Dental Status Current problems with teeth and/or dentures?: No Does patient usually wear dentures?: No  CIWA:    COWS:     Musculoskeletal: Strength & Muscle Tone: within normal limits Gait & Station: normal Patient leans: N/A  Psychiatric Specialty Exam: Physical Exam  ROS no headache, no chest pain, no shortness of breath, no vomiting   Blood pressure 120/67, pulse 86, temperature 98.9 F (37.2 C), resp. rate 20, height  (1.702 m), weight 67.6 kg (149 lb).Body mass index is 23.34 kg/m.  General Appearance: Fairly Groomed  Eye Contact:  improved today  Speech:  Normal Rate  Volume:  Decreased  Mood:  "Improving"  Affect:  less constricted, smiles at times during session  Thought Process:  Linear and Descriptions of Associations: Intact  Orientation:  Other:  fully alert and attentive   Thought Content:  Denies hallucinations, no delusions expressed at this time  Suicidal Thoughts:  No denies any suicidal or self injurious ideations at this time, contracts for safety on unit   Homicidal Thoughts:  No denies homicidal or violent idetions  Memory:  recent and remote grossly intact   Judgement:  Fair- improving  Insight:  Fair  Psychomotor Activity:  more visible on unit today  Concentration:  Concentration: Good and Attention Span: Good  Recall:  Good  Fund of Knowledge:  Good  Language:  Good  Akathisia:  Negative  Handed:  Right   AIMS (if indicated):     Assets:  Desire for Improvement Resilience  ADL's:  Intact  Cognition:  WNL  Sleep:  Number of Hours: 6.5   Assessment - Patient reports some improvement compared to admission presentation, and is presenting with a somewhat more reactive affect. Denies any active SI at this time, and contracts for safety on unit. Tolerating medications well. Of note, patient states he has responded best to DEPAKOTE in the past, and did not have side effects. He states he felt more stable while he was on this medication. Wants to restart Depakote.  Treatment Plan Summary: 12-26-16, reviewed, no changes made. Daily contact with patient to assess and evaluate symptoms and progress in treatment, Medication management, Plan inpatient treatment  and medications as below  Encourage group and milieu participation to work on coping skills and symptom reduction  For anxiety: Will continue Buspar 5 mgrs BID.   For depression  Will continue Prozac 10 mgrs Q DAY.   For anxiety, pain Will continue Neurontin 200 mgrs TID.    For psychosis, mood symptoms: Will continue Risperidone 1 mgr BID.    For Mood stability: Will  Depakote ER 250 mgrs QDAY initially.  Will start medication at low dose to minimize side effect risk.  Will obtain Depakote level on 12-28-16.   I have discussed case with ID consultant- due to most recent CD4 count, would not recommend restarting antiretroviral medication at this time, but to reinforce importance of keeping [facility] at [facility] Treatment team working on disposition planning   Sanjuana Kava, NP, PMHNP, FNP-BC 12/26/2016, 3:25 PMPatient ID: Edwin Martinez, male   DOB: April 10, 1986, 31 y.o.   MRN: [id number]

## 2016-12-27 NOTE — [facility] Group Notes (Signed)
[facility] Group Notes: (Clinical Social Work)   12/27/2016      Type of Therapy:  Group Therapy   Participation Level:  Did Not Attend despite MHT prompting   Ambrose Mantle, LCSW 12/27/2016, 12:50 PM

## 2016-12-27 NOTE — Final Progress Note (Addendum)
Edwin Martinez is seen OOB UAL without diff on 400 hall. HE is bright and sweet and says coyly, " hi" to this Clinical research associate. He takes his 8a meds around 1100( he would not open his eyes when writer attempted to awaken him earleir in day   To take these meds. ).. A Pt  Has been  in the milieu..in the dayroom watching TV and interacting  With the other patients and the staff. He does complete his daily assessment this morning and on this he writes he denies SI today and he rates his depression, hopelessness and anxiety . He is ( now) engaged in his recovery . Nursing to cont to support this and  Maintain therapeutic relationship.

## 2016-12-27 NOTE — Progress Notes (Signed)
Writer has observed patient up in the dayroom watching tv. Writer spoke with patient 1:1 and he reports having had a good day. He reports that he ate well and feels better today. Writer reminded him that he slept through the previous shift on last night and he reported that he was really tired but feels much better. He is  Hoping to discharge soon and reports that his social worker is helping him with having his information sent to [employer] who are working with him to get his disability. He has been interacting and laughing with peers in the dayroom. Support offered and safety maintained on unit with 15 min checks.

## 2016-12-27 NOTE — Progress Notes (Signed)
Adult Psychoeducational Group Note  Date:  12/27/2016 Time:  9:25 PM  Group Topic/Focus:  Wrap-Up Group:   The focus of this group is to help patients review their daily goal of treatment and discuss progress on daily workbooks.  Participation Level:  Active  Participation Quality:  Appropriate  Affect:  Appropriate  Cognitive:  Appropriate  Insight: Appropriate  Engagement in Group:  Engaged  Modes of Intervention:  Discussion  Additional Comments:  Pt stated his goal was to get back on his meds. Pt stated one positive thing that happened is, he is happy.  Caswell Corwin 12/27/2016, 9:25 PM

## 2016-12-27 NOTE — Progress Notes (Signed)
[facility] MD Progress Note  12/27/2016 1:58 PM EBON KETCHUM  MRN:  [id number]   Subjective: Patient reports " I am doing okay. Who do I need to talk to about discharge?"  Objective:Kalman A Proby seen resting in bedroom.  Denies suicidal or homicidal ideation. Reports intermittent auditory hallucination, denies during this assessment. denies visual hallucination and does not appear to be responding to internal stimuli. Patient is requesting to leave, reports he's doing much better, than when he came into the hospital. Reports he is medication complaint and tolerating medications well. Support, encouragement and reassurance was provided.   Principal Problem: Schizoaffective disorder, Bipolar-type.  Diagnosis:   Patient Active Problem List   Diagnosis Date Noted  . Schizoaffective disorder, bipolar type (HCC) [F25.0] 12/23/2016  . Suicidal ideation [R45.851]   . Cocaine use disorder, mild, abuse [F14.10] 07/27/2016  . Cannabis use disorder, moderate, dependence (HCC) [F12.20] 07/27/2016  . Tobacco use disorder [F17.200] 07/27/2016  . Intentional drug overdose (HCC) [T50.902A] 07/18/2016  . Bipolar disorder, current episode depressed, mild (HCC) [F31.31] 05/04/2016  . Asthma [J45.909] 05/20/2007  . Human immunodeficiency virus (HIV) disease (HCC) [B20] 05/05/2007   Total Time spent with patient: 15 minutes  Past Medical History:  Past Medical History:  Diagnosis Date  . ADHD (attention deficit hyperactivity disorder)   . ADHD (attention deficit hyperactivity disorder) 09/12/2012  . Anxiety   . Asthma   . Bipolar 1 disorder (HCC)   . Bipolar disorder (HCC)   . Epileptic seizures (HCC)   . HIV (human immunodeficiency virus infection) (HCC)   . Hypertension   . Schizophrenia (HCC)   . Seizures (HCC)     Past Surgical History:  Procedure Laterality Date  . DENTAL SURGERY     Family History:  Family History  Problem Relation Age of Onset  . Huntington's disease Father   .  Heart disease Mother   . Suicidality Maternal Uncle   . Suicidality Maternal Grandmother    Social History:  History  Alcohol Use  . 1.2 oz/week  . 2 Standard drinks or equivalent per week    Comment: once week      History  Drug Use  . Types: Cocaine    Comment: smoked crack 12-15-16    Social History   Social History  . Marital status: Single    Spouse name: N/A  . Number of children: N/A  . Years of education: N/A   Social History Main Topics  . Smoking status: Current Every Day Smoker    Packs/day: 0.50    Types: Cigarettes    Start date: 09/29/1991  . Smokeless tobacco: Never Used  . Alcohol use 1.2 oz/week    2 Standard drinks or equivalent per week     Comment: once week   . Drug use: Yes    Types: Cocaine     Comment: smoked crack 12-15-16  . Sexual activity: Yes    Birth control/ protection: Condom   Other Topics Concern  . None   Social History Narrative   ** Merged History Encounter **       Additional Social History:    Pain Medications: See home med list Prescriptions: See  home med list Over the Counter: See home med list History of alcohol / drug use?: Yes Longest period of sobriety (when/how long): UNKNOWN Negative Consequences of Use: Legal, Personal relationships, Financial Name of Substance 1: CRACK COCAINE 1 - Age of First Use: 22 1 - Amount (size/oz): VARIES 1 - Frequency:  DAILY 1 - Duration: ONGOING 1 - Last Use / Amount: 12/14/16 Name of Substance 2: ETOH 2 - Age of First Use: 17 2 - Amount (size/oz): 40 oz 2 - Frequency: per week 2 - Duration: ongoing 2 - Last Use / Amount: 12/14/16 Name of Substance 3: NICOTINE 3 - Age of First Use: TEEN 3 - Amount (size/oz): 1/2 PACK 3 - Frequency: DAILY 3 - Duration: ONGOING 3 - Last Use / Amount: 12/15/16 Name of Substance 4: CANNABIS 4 - Age of First Use: 15 4 - Amount (size/oz): VARIES 4 - Frequency: VARIES 4 - Duration: ONGOING 4 - Last Use / Amount: MONTHS AGO  Sleep:  Good  Appetite:  Fair  Current Medications: Current Facility-Administered Medications  Medication Dose Route Frequency Provider Last Rate Last Dose  . albuterol (PROVENTIL HFA;VENTOLIN HFA) 108 (90 Base) MCG/ACT inhaler 2 puff  2 puff Inhalation Q6H PRN Sanjuana Kava, NP      . busPIRone (BUSPAR) tablet 5 mg  5 mg Oral BID Craige Cotta, MD   5 mg at 12/27/16 1122  . divalproex (DEPAKOTE ER) 24 hr tablet 250 mg  250 mg Oral Daily Craige Cotta, MD   250 mg at 12/27/16 1123  . FLUoxetine (PROZAC) capsule 10 mg  10 mg Oral Daily Craige Cotta, MD   10 mg at 12/27/16 1123  . gabapentin (NEURONTIN) capsule 100 mg  100 mg Oral TID Craige Cotta, MD   100 mg at 12/27/16 1200  . risperiDONE (RISPERDAL M-TABS) disintegrating tablet 2 mg  2 mg Oral Q8H PRN Kerry Hough, PA-C       And  . LORazepam (ATIVAN) tablet 1 mg  1 mg Oral PRN Kerry Hough, PA-C       And  . ziprasidone (GEODON) injection 20 mg  20 mg Intramuscular PRN Kerry Hough, PA-C      . risperiDONE (RISPERDAL) tablet 1 mg  1 mg Oral BID Craige Cotta, MD   1 mg at 12/27/16 1122  . traZODone (DESYREL) tablet 50 mg  50 mg Oral QHS PRN Sanjuana Kava, NP   50 mg at 12/25/16 2229   Lab Results:  No results found for this or any previous visit (from the past 48 hour(s)).  Blood Alcohol level:  Lab Results  Component Value Date   ETH <5 12/23/2016   ETH <5 12/19/2016   Metabolic Disorder Labs: Lab Results  Component Value Date   HGBA1C 5.6 12/24/2016   MPG 114 12/24/2016   MPG 111 05/04/2016   Lab Results  Component Value Date   PROLACTIN 28.8 (H) 12/24/2016   PROLACTIN 32.3 (H) 08/19/2016   Lab Results  Component Value Date   CHOL 128 12/24/2016   TRIG 130 12/24/2016   HDL 35 (L) 12/24/2016   CHOLHDL 3.7 12/24/2016   VLDL 26 12/24/2016   LDLCALC 67 12/24/2016   LDLCALC 75 08/19/2016   Physical Findings: AIMS: Facial and Oral Movements Muscles of Facial Expression: None, normal Lips and  Perioral Area: None, normal Jaw: None, normal Tongue: None, normal,Extremity Movements Upper (arms, wrists, hands, fingers): None, normal Lower (legs, knees, ankles, toes): None, normal, Trunk Movements Neck, shoulders, hips: None, normal, Overall Severity Severity of abnormal movements (highest score from questions above): None, normal Incapacitation due to abnormal movements: None, normal Patient's awareness of abnormal movements (rate only patient's report): No Awareness, Dental Status Current problems with teeth and/or dentures?: No Does patient usually wear dentures?: No  CIWA:    COWS:     Musculoskeletal: Strength & Muscle Tone: within normal limits Gait & Station: normal Patient leans: N/A  Psychiatric Specialty Exam: Physical Exam  Vitals reviewed. Constitutional: He is oriented to person, place, and time. He appears well-developed.  Neurological: He is oriented to person, place, and time.  Psychiatric: He has a normal mood and affect. His behavior is normal.    Review of Systems  Psychiatric/Behavioral: Positive for depression and hallucinations.   no headache, no chest pain, no shortness of breath, no vomiting   Blood pressure 132/74, pulse 75, temperature 97.8 F (36.6 C), resp. rate 18, height  (1.702 m), weight 67.6 kg (149 lb).Body mass index is 23.34 kg/m.  General Appearance: Disheveled  Eye Contact:  Good  Speech:  Normal Rate  Volume:  Decreased  Mood:  Anxious, Depressed and Irritable  Affect:  Congruent  Thought Process:  Coherent and Linear  Orientation:  Full (Time, Place, and Person)  Thought Content:  Rumination  Suicidal Thoughts:  No   Homicidal Thoughts:  No   Memory:  recent and remote grossly intact   Judgement:  Fair  Insight:  Fair  Psychomotor Activity:  Restlessness  Concentration:  Concentration: Good and Attention Span: Good  Recall:  Good  Fund of Knowledge:  Good  Language:  Good  Akathisia:  Negative  Handed:  Right   AIMS (if indicated):     Assets:  Desire for Improvement Resilience  ADL's:  Intact  Cognition:  WNL  Sleep:  Number of Hours: 6.5     I agree with current treatment plan on 12/27/2016, Patient seen face-to-face for psychiatric evaluation follow-up, chart reviewed. Reviewed the information documented and agree with the treatment plan.   Treatment Plan Summary: continue with treatment plan on 12/27/2016. Except where noted.   Daily contact with patient to assess and evaluate symptoms and progress in treatment, Medication management, Plan inpatient treatment  and medications as below  Encourage group and milieu participation to work on coping skills and symptom reduction  For anxiety: Will continue Buspar 5 mgrs BID.   For depression  Will continue Prozac 10 mgrs Q DAY.   For anxiety, pain Will continue Neurontin 200 mgrs TID.    For psychosis, mood symptoms: Will continue Risperidone 1 mgr BID.    For Mood stability: Will  Depakote ER 250 mgrs QDAY initially.  Will start medication at low dose to minimize side effect risk.  Will obtain Depakote level on 12-28-16.   Noted by MD on 12/25/2016: I have discussed case with ID consultant- due to most recent CD4 count, would not recommend restarting antiretroviral medication at this time, but to reinforce importance of keeping upcoming appointment at [facility] Treatment team working on disposition planning   Oneta Rack, NP 12/27/2016, 1:58 PM

## 2016-12-28 MED ORDER — TRAZODONE HCL 50 MG PO TABS
ORAL_TABLET | ORAL | Status: AC
  Filled 2016-12-28: qty 1

## 2016-12-28 MED ORDER — DIVALPROEX SODIUM ER 250 MG PO TB24: 250.0000 mg | ORAL_TABLET | Freq: Every day | ORAL | 0 refills | Status: DC

## 2016-12-28 MED ORDER — RISPERIDONE 1 MG PO TABS: 1.0000 mg | ORAL_TABLET | Freq: Two times a day (BID) | ORAL | 0 refills | Status: DC

## 2016-12-28 MED ORDER — GABAPENTIN 100 MG PO CAPS: 100.0000 mg | ORAL_CAPSULE | Freq: Three times a day (TID) | ORAL | 0 refills | Status: DC

## 2016-12-28 MED ORDER — TRAZODONE HCL 50 MG PO TABS
50.0000 mg | ORAL_TABLET | Freq: Once | ORAL | Status: AC
  Administered 2016-12-28: 50 mg via ORAL
  Filled 2016-12-28: qty 1

## 2016-12-28 MED ORDER — ALBUTEROL SULFATE HFA 108 (90 BASE) MCG/ACT IN AERS: 2.0000 | INHALATION_SPRAY | Freq: Four times a day (QID) | RESPIRATORY_TRACT | 0 refills | Status: DC | PRN

## 2016-12-28 MED ORDER — TRAZODONE HCL 50 MG PO TABS: 50.0000 mg | ORAL_TABLET | Freq: Every evening | ORAL | 0 refills | Status: DC | PRN

## 2016-12-28 MED ORDER — FLUOXETINE HCL 10 MG PO CAPS: 10.0000 mg | ORAL_CAPSULE | Freq: Every day | ORAL | 0 refills | Status: DC

## 2016-12-28 MED ORDER — BUSPIRONE HCL 5 MG PO TABS: 5.0000 mg | ORAL_TABLET | Freq: Two times a day (BID) | ORAL | 0 refills | Status: DC

## 2016-12-28 NOTE — Progress Notes (Signed)
Recreation Therapy Notes  Date: 12/28/16 Time: 0930 Location: 300 Hall Dayroom  Group Topic: Stress Management  Goal Area(s) Addresses:  Patient will verbalize importance of using healthy stress management.  Patient will identify positive emotions associated with healthy stress management.   Intervention: Stress Management  Activity :  LRT introduced the stress management technique of guided imagery.  LRT read a script to engage patients in guided imagery.  Patients were to follow along as the script was read in order to fully participate in the technique.  Education:  Stress Management, Discharge Planning.   Education Outcome: Acknowledges edcuation/In group clarification offered/Needs additional education  Clinical Observations/Feedback: Pt did not attend group.   Caroll Rancher, LRT/CTRS         Caroll Rancher A 12/28/2016 12:10 PM

## 2016-12-28 NOTE — Progress Notes (Signed)
D:  Patient's self inventory sheet, patient sleeps good, sleep medication helpful.  Good appetite, normal energy level, good concentration.  Denied depression, hopeless and anxiety.  Denied withdrawals.  Denied SI.  Denied physical problems.  Denied pain.  Goal is stay on meds and go home.  Plans to do all he can.  No discharge plans. A:  Medications administered per MD orders.  Emotional support and encouragement given patient. R:  Denied SI and HI, contracts for safety.  Denied A/V hallucinations.  Safety maintained with 15 minute checks.

## 2016-12-28 NOTE — Progress Notes (Signed)
Discharge Note:  Patient discharged with bus ticket.  Patient denied SI and HI.  Denied A/V hallucinations.  Patient stated he received all his belongings, clothing, misc items, toiletries, medications, prescriptions, etc.  Patient stated he appreciated all assistance received from [facility] staff.  Suicide prevention information given and discussed with patient who stated he understood and had no questions.  All required discharge information given to patient at discharge.

## 2016-12-28 NOTE — Discharge Summary (Signed)
Physician Discharge Summary Note  Patient:  Edwin Martinez is an 31 y.o., male MRN:  [id number] DOB:  1986-09-25 Patient phone:  There is no home phone number on file.  Patient address:   [place] [address],  Total Time spent with patient: 30 minutes  Date of Admission:  12/23/2016 Date of Discharge: 12/28/2016  Reason for Admission: Per HPI-This is one of numerous admission assessment in the [facility] systems for this 31 year old African-American male with hx of mental illness. He is also HIV positive & has Hx of drug use. He is currently being admitted to the hospital for complaints of auditory hallucinations telling him to kill himself. During this assessment, [name] reports, "The EMS took me to the ED. I called them because I have been having suicidal thoughts x 1 week. The suicidal thoughts worsened last night. I'm homeless & also trying to find the right medicine for my mental illness. I have had Bipolar disorder since 2001. I also suffer from anxiety & insomnia. I have not been on my medications in 2 months. The doctors at [facility] would not give me the medicines that work for me. They only give me what they want & feel like giving me. The medications that work for me include; Buspar, Depakote, Prozac, Gabapentin & Trazodone. I attempted to overdose on a bottle of Tylenol & Excedrin few days ago. This is one of my many attempts. I do have an appoint at the infectious clinic on 01-07-17 for my HIV infection. I need to re-start my HIV medicines again".  Principal Problem: Schizoaffective disorder, bipolar type [facility]) Discharge Diagnoses: Patient Active Problem List   Diagnosis Date Noted  . Schizoaffective disorder, bipolar type (HCC) [F25.0] 12/23/2016  . Suicidal ideation [R45.851]   . Cocaine use disorder, mild, abuse [F14.10] 07/27/2016  . Cannabis use disorder, moderate, dependence (HCC) [F12.20] 07/27/2016  . Tobacco use disorder [F17.200] 07/27/2016  . Intentional drug  overdose (HCC) [T50.902A] 07/18/2016  . Bipolar disorder, current episode depressed, mild (HCC) [F31.31] 05/04/2016  . Asthma [J45.909] 05/20/2007  . Human immunodeficiency virus (HIV) disease (HCC) [B20] 05/05/2007    Past Psychiatric History:  Past Medical History:  Past Medical History:  Diagnosis Date  . ADHD (attention deficit hyperactivity disorder)   . ADHD (attention deficit hyperactivity disorder) 09/12/2012  . Anxiety   . Asthma   . Bipolar 1 disorder (HCC)   . Bipolar disorder (HCC)   . Epileptic seizures (HCC)   . HIV (human immunodeficiency virus infection) (HCC)   . Hypertension   . Schizophrenia (HCC)   . Seizures (HCC)     Past Surgical History:  Procedure Laterality Date  . DENTAL SURGERY     Family History:  Family History  Problem Relation Age of Onset  . Huntington's disease Father   . Heart disease Mother   . Suicidality Maternal Uncle   . Suicidality Maternal Grandmother    Family Psychiatric  History:  Social History:  History  Alcohol Use  . 1.2 oz/week  . 2 Standard drinks or equivalent per week    Comment: once week      History  Drug Use  . Types: Cocaine    Comment: smoked crack 12-15-16    Social History   Social History  . Marital status: Single    Spouse name: N/A  . Number of children: N/A  . Years of education: N/A   Social History Main Topics  . Smoking status: Current Every Day Smoker  Packs/day: 0.50    Types: Cigarettes    Start date: 09/29/1991  . Smokeless tobacco: Never Used  . Alcohol use 1.2 oz/week    2 Standard drinks or equivalent per week     Comment: once week   . Drug use: Yes    Types: Cocaine     Comment: smoked crack 12-15-16  . Sexual activity: Yes    Birth control/ protection: Condom   Other Topics Concern  . None   Social History Narrative   ** Merged History Encounter **        Hospital Course: [name] was admitted for Schizoaffective disorder, bipolar type (HCC)  and crisis  management.  Pt was treated discharged with the medications listed below under Medication List.  Medical problems were identified and treated as needed.  Home medications were restarted as appropriate. Improvement was monitored by observation and [name] 's daily report of symptom reduction.  Emotional and mental status was monitored by daily self-inventory reports completed by [name] and clinical staff.         [name] was evaluated by the treatment team for stability and plans for continued recovery upon discharge. [name] 's motivation was an integral factor for scheduling further treatment. Employment, transportation, bed availability, health status, family support, and any pending legal issues were also considered during hospital stay. Pt was offered further treatment options upon discharge including but not limited to Residential, Intensive Outpatient, and Outpatient treatment.  [name] will follow up with the services as listed below under Follow Up Information.    Upon completion of this admission the patient was both mentally and medically stable for discharge denying suicidal/homicidal ideation, auditory/visual/tactile hallucinations, delusional thoughts and paranoia.    [name] responded well to treatment with Buspar 5 mg , Depakote 250 , Prozac  and Neurontin  without adverse effects. Pt demonstrated improvement without reported or observed adverse effects to the point of stability appropriate for outpatient management. Pertinent labs include: Lipid Panel,, Prolactin 28 (high)  for which outpatient follow-up is necessary for lab recheck as mentioned below. Reviewed CBC, CMP, BAL, and UDS; all unremarkable aside from noted exceptions.   Physical Findings: AIMS: Facial and Oral Movements Muscles of Facial Expression: None, normal Lips and Perioral Area: None, normal Jaw: None, normal Tongue: None, normal,Extremity Movements Upper  (arms, wrists, hands, fingers): None, normal Lower (legs, knees, ankles, toes): None, normal, Trunk Movements Neck, shoulders, hips: None, normal, Overall Severity Severity of abnormal movements (highest score from questions above): None, normal Incapacitation due to abnormal movements: None, normal Patient's awareness of abnormal movements (rate only patient's report): No Awareness, Dental Status Current problems with teeth and/or dentures?: No Does patient usually wear dentures?: No  CIWA:    COWS:     Musculoskeletal: Strength & Muscle Tone: within normal limits Gait & Station: normal Patient leans: N/A  Psychiatric Specialty Exam: See SRA by MD Physical Exam  Nursing note and vitals reviewed. Constitutional: He is oriented to person, place, and time. He appears well-developed.  Cardiovascular: Normal rate.   Neurological: He is oriented to person, place, and time.  Skin: Skin is warm and dry.  Psychiatric: He has a normal mood and affect. His behavior is normal.    Review of Systems  Psychiatric/Behavioral: Negative for depression (stable). The patient is not nervous/anxious (stable).     Blood pressure 125/69, pulse 80, temperature 98.6 F (37 C), temperature source Oral, resp. rate  18, height  (1.702 m), weight 67.6 kg (149 lb).Body mass index is 23.34 kg/m.   Have you used any form of tobacco in the last 30 days? (Cigarettes, Smokeless Tobacco, Cigars, and/or Pipes): Yes  Has this patient used any form of tobacco in the last 30 days? (Cigarettes, Smokeless Tobacco, Cigars, and/or Pipes) , Yes, A prescription for an FDA-approved tobacco cessation medication was offered at discharge and the patient refused  Blood Alcohol level:  Lab Results  Component Value Date   [facility] <5 12/23/2016   ETH <5 12/19/2016    Metabolic Disorder Labs:  Lab Results  Component Value Date   HGBA1C 5.6 12/24/2016   MPG 114 12/24/2016   MPG 111 05/04/2016   Lab Results  Component  Value Date   PROLACTIN 28.8 (H) 12/24/2016   PROLACTIN 32.3 (H) 08/19/2016   Lab Results  Component Value Date   CHOL 128 12/24/2016   TRIG 130 12/24/2016   HDL 35 (L) 12/24/2016   CHOLHDL 3.7 12/24/2016   VLDL 26 12/24/2016   LDLCALC 67 12/24/2016   LDLCALC 75 08/19/2016    See Psychiatric Specialty Exam and Suicide Risk Assessment completed by Attending Physician prior to discharge.  Discharge destination:  Home  Is patient on multiple antipsychotic therapies at discharge:  No   Has Patient had three or more failed trials of antipsychotic monotherapy by history:  No  Recommended Plan for Multiple Antipsychotic Therapies: NA  Discharge Instructions    Diet - low sodium heart healthy    Complete by:  As directed    Discharge instructions    Complete by:  As directed    Take all medications as prescribed. Keep all follow-up appointments as scheduled.  Do not consume alcohol or use illegal drugs while on prescription medications. Report any adverse effects from your medications to your primary care provider promptly.  In the event of recurrent symptoms or worsening symptoms, call 911, a crisis hotline, or go to the nearest emergency department for evaluation.   Increase activity slowly    Complete by:  As directed      Allergies as of 12/28/2016      Reactions   Magnesium-containing Compounds Other (See Comments)   This medication is contraindicated with pts HIV meds.     Peanut-containing Drug Products Anaphylaxis   Esomeprazole Magnesium Cough   Atripla [efavirenz-emtricitab-tenofovir] Other (See Comments)   Reaction:  Suicidal thoughts    Bactrim [sulfamethoxazole-trimethoprim] Rash   Penicillins Rash, Other (See Comments)   Has patient had a PCN reaction causing immediate rash, facial/tongue/throat swelling, SOB or lightheadedness with hypotension: Yes Has patient had a PCN reaction causing severe rash involving mucus membranes or skin necrosis: No Has patient had a  PCN reaction that required hospitalization No Has patient had a PCN reaction occurring within the last 10 years: No If all of the above answers are "NO", then may proceed with Cephalosporin use.      Medication List    STOP taking these medications   benztropine 1 MG tablet Commonly known as:  COGENTIN   citalopram 20 MG tablet Commonly known as:  CELEXA   hydrOXYzine 25 MG tablet Commonly known as:  ATARAX/VISTARIL     TAKE these medications     Indication  albuterol 108 (90 Base) MCG/ACT inhaler Commonly known as:  PROVENTIL HFA;VENTOLIN HFA Inhale 2 puffs into the lungs every 6 (six) hours as needed for wheezing or shortness of breath.  Indication:  Asthma   busPIRone  5 MG tablet Commonly known as:  BUSPAR Take 1 tablet (5 mg total) by mouth 2 (two) times daily. What changed:  medication strength  how much to take  when to take this  Indication:  Aggressive Behavior, Anxiety Disorder   divalproex 250 MG 24 hr tablet Commonly known as:  DEPAKOTE ER Take 1 tablet (250 mg total) by mouth daily. Start taking on:  12/29/2016  Indication:  mood stab   FLUoxetine 10 MG capsule Commonly known as:  PROZAC Take 1 capsule (10 mg total) by mouth daily. Start taking on:  12/29/2016  Indication:  Depression   gabapentin 100 MG capsule Commonly known as:  NEURONTIN Take 1 capsule (100 mg total) by mouth 3 (three) times daily. What changed:  medication strength  how much to take  Indication:  mood stablilzation   risperiDONE 1 MG tablet Commonly known as:  RISPERDAL Take 1 tablet (1 mg total) by mouth 2 (two) times daily. What changed:  when to take this  Another medication with the same name was removed. Continue taking this medication, and follow the directions you see here.  Indication:  Manic-Depression   traZODone 50 MG tablet Commonly known as:  DESYREL Take 1 tablet (50 mg total) by mouth at bedtime as needed for sleep.  Indication:  Trouble Sleeping       Follow-up [facility] Follow up.   Specialty:  Professional Counselor Why:  Go to the walk-in clinic M-F between 8:30 and 11:00 and 1:00 and 3:00 with in 5 days of d/c from the hospital for your hospital follow up appointment Contact information: [facility] [address] [place] [address] [phone]           Follow-up recommendations:  Activity:  as tolerated Diet:  heart healthy  Comments:  Take all medications as prescribed. Keep all follow-up appointments as scheduled.  Do not consume alcohol or use illegal drugs while on prescription medications. Report any adverse effects from your medications to your primary care provider promptly.  In the event of recurrent symptoms or worsening symptoms, call 911, a crisis hotline, or go to the nearest emergency department for evaluation.   Signed: Oneta Rack, NP 12/28/2016, 10:22 AM

## 2016-12-28 NOTE — Progress Notes (Signed)
  [facility] Adult Case Management Discharge Plan :  Will you be returning to the same living situation after discharge:  No, pt is connected with [facility] and [facility] and is hopeful to find housing soon. At discharge, do you have transportation home?: Yes,  Bus passes provided. Do you have the ability to pay for your medications: Yes,  prescriptions and samples provided.  Release of information consent forms completed and in the chart;  Patient's signature needed at discharge.  Patient to Follow up at: Follow-up [facility] Follow up.   Specialty:  Professional Counselor Why:  Go to the walk-in clinic M-F between 8:30 and 11:00 and 1:00 and 3:00 with in 5 days of d/c from the hospital for your hospital follow up appointment Contact information: [facility] [place] [address] [place] [address] [phone]        [facility]              Follow up on 01/07/2017.   Why:  Appointment with Judyann Munson .  P: [phone] Contact information: [address] [place] [address]          Next level of care provider has access to [facility] Link:no  Safety Planning and Suicide Prevention discussed: Yes,  with pt.  Have you used any form of tobacco in the last 30 days? (Cigarettes, Smokeless Tobacco, Cigars, and/or Pipes): Yes  Has patient been referred to the Quitline?: Patient refused referral  Patient has been referred for addiction treatment: Yes  Jonathon Jordan 12/28/2016, 10:46 AM

## 2016-12-28 NOTE — BHH Group Notes (Signed)
[facility] LCSW Aftercare Discharge Planning Group Note   12/28/2016 12:34 PM  Participation Quality:  Active   Mood/Affect:  Excited  Depression Rating:  Pt denies   Anxiety Rating:  Pt denies    Thoughts of Suicide:  No Will you contract for safety?   NA  Current AVH:  No  Plan for Discharge/Comments: pt currently denies all symptoms and states that he is feeling ready for discharge today. "I'm not hearing any voices anymore so I know my medicines are working. You all do a great job here. Every time I come here I get great care and I you all are great at what you do."  Transportation Means: Public transportation  Supports: Community supports   Jonathon Jordan, MSW, [employer]

## 2016-12-28 NOTE — BHH Suicide Risk Assessment (Signed)
[facility] Discharge Suicide Risk Assessment   Principal Problem: Depression , suicidal ideations Discharge Diagnoses:  Patient Active Problem List   Diagnosis Date Noted  . Schizoaffective disorder, bipolar type (HCC) [F25.0] 12/23/2016  . Suicidal ideation [R45.851]   . Cocaine use disorder, mild, abuse [F14.10] 07/27/2016  . Cannabis use disorder, moderate, dependence (HCC) [F12.20] 07/27/2016  . Tobacco use disorder [F17.200] 07/27/2016  . Intentional drug overdose (HCC) [T50.902A] 07/18/2016  . Bipolar disorder, current episode depressed, mild (HCC) [F31.31] 05/04/2016  . Asthma [J45.909] 05/20/2007  . Human immunodeficiency virus (HIV) disease (HCC) [B20] 05/05/2007    Total Time spent with patient: 30 minutes  Musculoskeletal: Strength & Muscle Tone: within normal limits Gait & Station: normal Patient leans: N/A  Psychiatric Specialty Exam: ROS denies headache, no chest pain, no shortness of breath, no vomiting, no fever   Blood pressure 125/69, pulse 80, temperature 98.6 F (37 C), temperature source Oral, resp. rate 18, height  (1.702 m), weight 67.6 kg (149 lb).Body mass index is 23.34 kg/m.  General Appearance: improved grooming   Eye Contact::  Good  Speech:  Normal Rate409  Volume:  Normal  Mood:  reports feeling better and presents euthymic at this time  Affect:  Appropriate and Full Range  Thought Process:  Linear and Descriptions of Associations: Intact  Orientation:  Full (Time, Place, and Person)  Thought Content:  no hallucinations, no delusions, not internally preoccupied   Suicidal Thoughts:  No denies any suicidal or self injurious ideations, denies any homicidal or violent ideations   Homicidal Thoughts:  No  Memory:  recent and remote grossly intact   Judgement:  Other:  improving   Insight:  improving   Psychomotor Activity:  Normal  Concentration:  Good  Recall:  Good  Fund of Knowledge:Good  Language: Good  Akathisia:  Negative  Handed:   Right  AIMS (if indicated):     Assets:  Communication Skills Desire for Improvement Resilience  Sleep:  Number of Hours: 5.75  Cognition: WNL  ADL's:  Intact   Mental Status Per Nursing Assessment::   On Admission:  Self-harm thoughts  Demographic Factors:  31 year old male   Loss Factors: Homelessness, disability, chronic illness   Historical Factors: Has been diagnosed with Bipolar Disorder and with Schizoaffective Disorder in the past, history of prior psychiatric admissions, history of prior suicide attempts   Risk Reduction Factors:   Positive coping skills or problem solving skills  Continued Clinical Symptoms:  At this time patient reports he is feeling much better than on admission - states he is doing well , and feels " better than I have in a long time", presents calm, pleasant, mood presents euthymic, and he denies feeling depressed, affect is appropriate, reactive, no thought disorder, no suicidal or self injurious disorder, no homicidal or violent ideations. Denies any medication side effects, tolerating medications well . We have reviewed medication side effects- regarding Depakote , patient has stated that this medication has historically been helpful and well tolerated. He is aware that current dose is low, and will likely require outpatient  further titration as tolerated and periodic serum level monitoring   Behavior on unit in good control.   Cognitive Features That Contribute To Risk:  No gross cognitive deficits noted upon discharge. Is alert , attentive, and oriented x 3   Suicide Risk:  Mild:  Suicidal ideation of limited frequency, intensity, duration, and specificity.  There are no identifiable plans, no associated intent, mild dysphoria and  related symptoms, good self-control (both objective and subjective assessment), few other risk factors, and identifiable protective factors, including available and accessible social support.  Follow-up [facility] Follow up.   Specialty:  Professional Counselor Why:  Go to the walk-in clinic M-F between 8:30 and 11:00 and 1:00 and 3:00 with in 5 days of d/c from the hospital for your hospital follow up appointment Contact information: [facility] [place] [address] [place] [address] [phone]           Plan Of Care/Follow-up recommendations:  Activity:  as tolerated Diet:  regular Tests:  NA Other:  See below  Patient is requesting discharge and there are no current grounds for involuntary commitment Plans to go to shelter after discharge Plans to follow up as above  On April 12 at 4,00 PM he has appointment at [facility] for ongoing management and treatment of HIV .   Craige Cotta, MD 12/28/2016, 10:12 AM

## 2016-12-28 NOTE — Tx Team (Signed)
Interdisciplinary Treatment and Diagnostic Plan Update 12/28/2016 Time of Session: 9:30am  Edwin Martinez  MRN: [id number]  Principal Diagnosis: Schizoaffective disorder, bipolar type [facility])  Secondary Diagnoses: Principal Problem:   Schizoaffective disorder, bipolar type (HCC)   Current Medications:  Current Facility-Administered Medications  Medication Dose Route Frequency Provider Last Rate Last Dose  . albuterol (PROVENTIL HFA;VENTOLIN HFA) 108 (90 Base) MCG/ACT inhaler 2 puff  2 puff Inhalation Q6H PRN Sanjuana Kava, NP      . busPIRone (BUSPAR) tablet 5 mg  5 mg Oral BID Craige Cotta, MD   5 mg at 12/28/16 0745  . divalproex (DEPAKOTE ER) 24 hr tablet 250 mg  250 mg Oral Daily Craige Cotta, MD   250 mg at 12/28/16 0745  . FLUoxetine (PROZAC) capsule 10 mg  10 mg Oral Daily Craige Cotta, MD   10 mg at 12/28/16 0745  . gabapentin (NEURONTIN) capsule 100 mg  100 mg Oral TID Craige Cotta, MD   100 mg at 12/28/16 0745  . risperiDONE (RISPERDAL M-TABS) disintegrating tablet 2 mg  2 mg Oral Q8H PRN Kerry Hough, PA-C       And  . LORazepam (ATIVAN) tablet 1 mg  1 mg Oral PRN Kerry Hough, PA-C       And  . ziprasidone (GEODON) injection 20 mg  20 mg Intramuscular PRN Kerry Hough, PA-C      . risperiDONE (RISPERDAL) tablet 1 mg  1 mg Oral BID Craige Cotta, MD   1 mg at 12/28/16 0745  . traZODone (DESYREL) 50 MG tablet           . traZODone (DESYREL) tablet 50 mg  50 mg Oral QHS PRN Sanjuana Kava, NP   50 mg at 12/27/16 2117    PTA Medications: Prescriptions Prior to Admission  Medication Sig Dispense Refill Last Dose  . albuterol (PROVENTIL HFA;VENTOLIN HFA) 108 (90 Base) MCG/ACT inhaler Inhale 2 puffs into the lungs every 6 (six) hours as needed for wheezing or shortness of breath.   More than a month at Unknown time  . benztropine (COGENTIN) 1 MG tablet Take 1 tablet (1 mg total) by mouth at bedtime. (Patient not taking: Reported on 12/17/2016) 30 tablet  0 More than a month at Unknown time  . busPIRone (BUSPAR) 10 MG tablet Take 10 mg by mouth 3 (three) times daily.   More than a month at Unknown time  . citalopram (CELEXA) 20 MG tablet Take 1 tablet (20 mg total) by mouth daily. (Patient not taking: Reported on 12/17/2016) 30 tablet 0 More than a month at Unknown time  . gabapentin (NEURONTIN) 300 MG capsule Take 300 mg by mouth 3 (three) times daily.   More than a month at Unknown time  . hydrOXYzine (ATARAX/VISTARIL) 25 MG tablet Take 25 mg by mouth every 6 (six) hours as needed.   More than a month at Unknown time  . risperiDONE (RISPERDAL) 1 MG tablet Take 1 mg by mouth daily.   More than a month at Unknown time  . risperiDONE (RISPERDAL) 3 MG tablet Take 3 mg by mouth at bedtime.   More than a month at Unknown time  . traZODone (DESYREL) 50 MG tablet Take 50 mg by mouth at bedtime as needed for sleep.   More than a month at Unknown time    Treatment Modalities: Medication Management, Group therapy, Case management,  1 to 1 session with clinician, Psychoeducation, Recreational therapy.  Patient Stressors: Financial difficulties Health problems Legal issue Medication change or noncompliance Patient Strengths: Average or above average intelligence Capable of independent living General fund of knowledge Motivation for treatment/growth  Physician Treatment Plan for Primary Diagnosis: Schizoaffective disorder, bipolar type (HCC) Long Term Goal(s): Improvement in symptoms so as ready for discharge Short Term Goals: Ability to disclose and discuss suicidal ideas Compliance with prescribed medications will improve Ability to identify triggers associated with substance abuse/mental health issues will improve Ability to disclose and discuss suicidal ideas Compliance with prescribed medications will improve Ability to identify triggers associated with substance abuse/mental health issues will improve  Medication Management: Evaluate patient's  response, side effects, and tolerance of medication regimen.  Therapeutic Interventions: 1 to 1 sessions, Unit Group sessions and Medication administration.  Evaluation of Outcomes: Adequate for Discharge  Physician Treatment Plan for Secondary Diagnosis: Principal Problem:   Schizoaffective disorder, bipolar type (HCC)  Long Term Goal(s): Improvement in symptoms so as ready for discharge  Short Term Goals: Ability to disclose and discuss suicidal ideas Compliance with prescribed medications will improve Ability to identify triggers associated with substance abuse/mental health issues will improve Ability to disclose and discuss suicidal ideas Compliance with prescribed medications will improve Ability to identify triggers associated with substance abuse/mental health issues will improve  Medication Management: Evaluate patient's response, side effects, and tolerance of medication regimen.  Therapeutic Interventions: 1 to 1 sessions, Unit Group sessions and Medication administration.  Evaluation of Outcomes: Adequate for Discharge  RN Treatment Plan for Primary Diagnosis: Schizoaffective disorder, bipolar type (HCC) Long Term Goal(s): Knowledge of disease and therapeutic regimen to maintain health will improve  Short Term Goals: Ability to participate in decision making will improve and Compliance with prescribed medications will improve  Medication Management: RN will administer medications as ordered by provider, will assess and evaluate patient's response and provide education to patient for prescribed medication. RN will report any adverse and/or side effects to prescribing provider.  Therapeutic Interventions: 1 on 1 counseling sessions, Psychoeducation, Medication administration, Evaluate responses to treatment, Monitor vital signs and CBGs as ordered, Perform/monitor CIWA, COWS, AIMS and Fall Risk screenings as ordered, Perform wound care treatments as ordered.  Evaluation of  Outcomes: Adequate for Discharge  LCSW Treatment Plan for Primary Diagnosis: Schizoaffective disorder, bipolar type (HCC) Long Term Goal(s): Safe transition to appropriate next level of care at discharge, Engage patient in therapeutic group addressing interpersonal concerns. Short Term Goals: Engage patient in aftercare planning with referrals and resources, Increase ability to appropriately verbalize feelings, Increase emotional regulation and Increase skills for wellness and recovery  Therapeutic Interventions: Assess for all discharge needs, 1 to 1 time with Social worker, Explore available resources and support systems, Assess for adequacy in community support network, Educate family and significant other(s) on suicide prevention, Complete Psychosocial Assessment, Interpersonal group therapy.  Evaluation of Outcomes: Adequate for Discharge  Progress in Treatment: Attending groups: Yes Participating in groups: Yes Taking medication as prescribed: Yes, MD continues to assess for medication changes as needed Toleration medication: Yes, no side effects reported at this time Family/Significant other contact made: No, pt declined contact. Patient understands diagnosis: Yes, AEB pt's willingness to participate in treatment. Discussing patient identified problems/goals with staff: Yes Medical problems stabilized or resolved: Yes Denies suicidal/homicidal ideation: Yes Issues/concerns per patient self-inventory: None Other: N/A  New problem(s) identified: None identified at this time.   New Short Term/Long Term Goal(s): None identified at this time.   Discharge Plan or Barriers: Pt will [facility]  Reason for Continuation of Hospitalization:  None identified at this time.  Estimated Length of Stay: 0 days  Attendees: Patient: 12/28/2016 10:41 AM  Physician: Dr. Jama Flavors 12/28/2016 10:41 AM  Nursing: Foy Guadalajara RN;  Rayfield Citizen, RN 12/28/2016 10:41 AM  RN Care Manager: Onnie Boer, RN 12/28/2016 10:41 AM  Social Worker: Donnelly Stager, LCSWA 12/28/2016 10:41 AM  Recreational Therapist:  12/28/2016 10:41 AM  Other: Armandina Stammer, NP; Gray Bernhardt, NP 12/28/2016 10:41 AM  Other:  12/28/2016 10:41 AM  Other: 12/28/2016 10:41 AM   Scribe for Treatment Team: Jonathon Jordan, MSW,LCSWA 12/28/2016 10:41 AM

## 2017-01-07 ENCOUNTER — Ambulatory Visit: Payer: Self-pay | Admitting: Internal Medicine

## 2017-03-04 ENCOUNTER — Encounter (HOSPITAL_COMMUNITY): Payer: Self-pay | Admitting: Emergency Medicine

## 2017-03-04 ENCOUNTER — Telehealth: Payer: Self-pay | Admitting: Internal Medicine

## 2017-03-04 ENCOUNTER — Emergency Department (HOSPITAL_COMMUNITY)
Admission: EM | Admit: 2017-03-04 | Discharge: 2017-03-04 | Disposition: A | Discharge status: Home / Self Care | Payer: Self-pay | Attending: Emergency Medicine | Admitting: Emergency Medicine

## 2017-03-04 DIAGNOSIS — Z79899 Other long term (current) drug therapy: Secondary | ICD-10-CM | POA: Insufficient documentation

## 2017-03-04 DIAGNOSIS — J45909 Unspecified asthma, uncomplicated: Secondary | ICD-10-CM | POA: Insufficient documentation

## 2017-03-04 DIAGNOSIS — R590 Localized enlarged lymph nodes: Secondary | ICD-10-CM | POA: Insufficient documentation

## 2017-03-04 DIAGNOSIS — Z91010 Allergy to peanuts: Secondary | ICD-10-CM | POA: Insufficient documentation

## 2017-03-04 DIAGNOSIS — L243 Irritant contact dermatitis due to cosmetics: Secondary | ICD-10-CM | POA: Insufficient documentation

## 2017-03-04 DIAGNOSIS — I10 Essential (primary) hypertension: Secondary | ICD-10-CM | POA: Insufficient documentation

## 2017-03-04 DIAGNOSIS — F17210 Nicotine dependence, cigarettes, uncomplicated: Secondary | ICD-10-CM | POA: Insufficient documentation

## 2017-03-04 NOTE — ED Provider Notes (Addendum)
[facility]-EMERGENCY DEPT Provider Note   CSN: [id number] Arrival date & time: 03/04/17  4098     History   Chief Complaint Chief Complaint  Patient presents with  . Allergic Reaction    HPI Edwin Martinez is a 31 y.o. male.Patient developed a rash about his neck and shoulders over the past 3 days since applying cologne to the area. Skin is described as burning. She also complains of a "knot" in his left posterior neck. She's been there for approximately 3 days. He denies fever denies other complaint. No shortness of breath. No other associated symptoms  HPI  Past Medical History:  Diagnosis Date  . ADHD (attention deficit hyperactivity disorder)   . ADHD (attention deficit hyperactivity disorder) 09/12/2012  . Anxiety   . Asthma   . Bipolar 1 disorder (HCC)   . Bipolar disorder (HCC)   . Epileptic seizures (HCC)   . HIV (human immunodeficiency virus infection) (HCC)   . Hypertension   . Schizophrenia (HCC)   . Seizures [facility])     Patient Active Problem List   Diagnosis Date Noted  . Schizoaffective disorder, bipolar type (HCC) 12/23/2016  . Suicidal ideation   . Cocaine use disorder, mild, abuse 07/27/2016  . Cannabis use disorder, moderate, dependence (HCC) 07/27/2016  . Tobacco use disorder 07/27/2016  . Intentional drug overdose (HCC) 07/18/2016  . Bipolar disorder, current episode depressed, mild (HCC) 05/04/2016  . Asthma 05/20/2007  . Human immunodeficiency virus (HIV) disease (HCC) 05/05/2007    Past Surgical History:  Procedure Laterality Date  . DENTAL SURGERY         Home Medications    Prior to Admission medications   Medication Sig Start Date End Date Taking? Authorizing Provider  albuterol (PROVENTIL HFA;VENTOLIN HFA) 108 (90 Base) MCG/ACT inhaler Inhale 2 puffs into the lungs every 6 (six) hours as needed for wheezing or shortness of breath. 12/28/16   Oneta Rack, NP  busPIRone (BUSPAR) 5 MG tablet Take 1 tablet (5 mg total) by mouth 2 (two)  times daily. 12/28/16   Oneta Rack, NP  divalproex (DEPAKOTE ER) 250 MG 24 hr tablet Take 1 tablet (250 mg total) by mouth daily. 12/29/16   Oneta Rack, NP  FLUoxetine (PROZAC) 10 MG capsule Take 1 capsule (10 mg total) by mouth daily. 12/29/16   Oneta Rack, NP  gabapentin (NEURONTIN) 100 MG capsule Take 1 capsule (100 mg total) by mouth 3 (three) times daily. 12/28/16   Oneta Rack, NP  risperiDONE (RISPERDAL) 1 MG tablet Take 1 tablet (1 mg total) by mouth 2 (two) times daily. 12/28/16   Oneta Rack, NP  traZODone (DESYREL) 50 MG tablet Take 1 tablet (50 mg total) by mouth at bedtime as needed for sleep. 12/28/16   Oneta Rack, NP    Family History Family History  Problem Relation Age of Onset  . Huntington's disease Father   . Heart disease Mother   . Suicidality Maternal Uncle   . Suicidality Maternal Grandmother     Social History Social History  Substance Use Topics  . Smoking status: Current Every Day Smoker    Packs/day: 0.50    Types: Cigarettes    Start date: 09/29/1991  . Smokeless tobacco: Never Used  . Alcohol use 1.2 oz/week    2 Standard drinks or equivalent per week     Comment: once week      Allergies   Magnesium-containing compounds; Peanut-containing drug products; Esomeprazole magnesium; Atripla [efavirenz-emtricitab-tenofovir];  Bactrim [sulfamethoxazole-trimethoprim]; and Penicillins   Review of Systems Review of Systems  Constitutional: Negative.   HENT: Negative.   Respiratory: Negative.   Cardiovascular: Positive for chest pain.       Syncope  Gastrointestinal: Negative.   Musculoskeletal: Negative.   Skin: Positive for rash.  Allergic/Immunologic: Negative.   Neurological: Negative.   Psychiatric/Behavioral: Negative.   All other systems reviewed and are negative.    Physical Exam Updated Vital Signs BP 135/76 (BP Location: Right Arm)   Pulse 92   Temp 98.2 F (36.8 C) (Oral)   Resp 16   Ht 5\' 7"  (1.702 m)   Wt 72.6 kg  (160 lb)   SpO2 100%   BMI 25.06 kg/m   Physical Exam  Constitutional: He appears well-developed and well-nourished.  HENT:  Head: Normocephalic and atraumatic.  Eyes: Conjunctivae are normal. Pupils are equal, round, and reactive to light.  Neck: Neck supple. No tracheal deviation present. No thyromegaly present.  Single left-sided posterior cervical node, nontender  Cardiovascular: Normal rate and regular rhythm.   No murmur heard. Pulmonary/Chest: Effort normal and breath sounds normal.  Abdominal: Soft. Bowel sounds are normal. He exhibits no distension. There is no tenderness.  Musculoskeletal: Normal range of motion. He exhibits no edema or tenderness.  Lymphadenopathy:    He has cervical adenopathy.  Neurological: He is alert. Coordination normal.  Skin: Skin is warm and dry. Rash noted.  Pinkish heaped up rash overlying both shoulders and anterior neck  Psychiatric: He has a normal mood and affect.  Nursing note and vitals reviewed.    ED Treatments / Results  Labs (all labs ordered are listed, but only abnormal results are displayed) Labs Reviewed - No data to display  EKG  EKG Interpretation None       Radiology No results found.  Procedures Procedures (including critical care time)  Medications Ordered in ED Medications - No data to display   Initial Impression / Assessment and Plan / ED Course  I have reviewed the triage vital signs and the nursing notes.  Pertinent labs & imaging results that were available during my care of the patient were reviewed by me and considered in my medical decision making (see chart for details).     Patient reports that he has been on Triumeq, however he is uncertain whether he is supposed to be on or not. I spoke with Dr. Orvan Falconerampbell from infectious disease.. Patient is well-known to infectious disease clinic and has been noncompliant. Dr. Orvan Falconerampbell suggests that I supply patient with infectious disease coordinator [name]  McGee's contact information so that he can call to arrange to get back in to the infectious disease clinic. Will treat patient with hydrocortisone cream for his rash. He is advised to avoid cologne and wash skin thoroughly with soap and water Final Clinical Impressions(s) / ED Diagnoses  Diagnosis #1 contact dermatitis #2 cervical adenopathy Final diagnoses:  None    New Prescriptions New Prescriptions   No medications on file     Doug SouJacubowitz, Ronnie Doo, MD 03/04/17 1137    Doug SouJacubowitz, Riad Wagley, MD 03/04/17 1141

## 2017-03-04 NOTE — Discharge Instructions (Signed)
Avoid all colognes and wash your skin thoroughly with soap and water Use hydrocortisone cream 1% 3 times daily as directed. Please call [name] at [phone] in order to get back into the infectious disease clinic and get treatment for HIV

## 2017-03-04 NOTE — Telephone Encounter (Signed)
I received a phone call today from Dr. Remi HaggardSam Jacobowitz. [name] with a case of apparent contact dermatitis from some new cologne he used. [name] has been out of HIV care here in our clinic since December 2016. I spoke with our Bridge counselor, Dwain SarnaMitch McGee, who knows [name] well. I asked Dr. Rennis ChrisJacobowitz to give [name] [name]'s cell phone number and have him contact [name] to arrange follow-up.

## 2017-03-04 NOTE — ED Triage Notes (Signed)
Patient here via EMS with complaints of allergic reaction from "cheap cologne from the dollar store". Reaction area only to area where he's been spraying. Rash to upper chest and shoulders. Pain described as uncomfortable.

## 2017-03-04 NOTE — ED Notes (Signed)
Patient given sandwich, cheese, and water. 

## 2017-03-04 NOTE — ED Notes (Signed)
ED Provider at bedside. 

## 2017-03-06 ENCOUNTER — Encounter (HOSPITAL_COMMUNITY): Payer: Self-pay

## 2017-03-06 ENCOUNTER — Inpatient Hospital Stay (HOSPITAL_COMMUNITY)
Admission: EM | Admit: 2017-03-06 | Discharge: 2017-03-12 | DRG: 596 | Disposition: A | Discharge status: Home / Self Care | Payer: Self-pay | Attending: Family Medicine | Admitting: Family Medicine

## 2017-03-06 ENCOUNTER — Emergency Department (HOSPITAL_COMMUNITY): Payer: Self-pay

## 2017-03-06 DIAGNOSIS — B028 Zoster with other complications: Secondary | ICD-10-CM

## 2017-03-06 DIAGNOSIS — Z5900 Homelessness unspecified: Secondary | ICD-10-CM

## 2017-03-06 DIAGNOSIS — B20 Human immunodeficiency virus [HIV] disease: Secondary | ICD-10-CM | POA: Diagnosis present

## 2017-03-06 DIAGNOSIS — F1910 Other psychoactive substance abuse, uncomplicated: Secondary | ICD-10-CM | POA: Diagnosis present

## 2017-03-06 DIAGNOSIS — F17210 Nicotine dependence, cigarettes, uncomplicated: Secondary | ICD-10-CM | POA: Diagnosis present

## 2017-03-06 DIAGNOSIS — Z79899 Other long term (current) drug therapy: Secondary | ICD-10-CM

## 2017-03-06 DIAGNOSIS — J45909 Unspecified asthma, uncomplicated: Secondary | ICD-10-CM | POA: Diagnosis present

## 2017-03-06 DIAGNOSIS — Z882 Allergy status to sulfonamides status: Secondary | ICD-10-CM

## 2017-03-06 DIAGNOSIS — B029 Zoster without complications: Principal | ICD-10-CM | POA: Diagnosis present

## 2017-03-06 DIAGNOSIS — F259 Schizoaffective disorder, unspecified: Secondary | ICD-10-CM | POA: Diagnosis present

## 2017-03-06 DIAGNOSIS — Z21 Asymptomatic human immunodeficiency virus [HIV] infection status: Secondary | ICD-10-CM | POA: Diagnosis present

## 2017-03-06 DIAGNOSIS — I10 Essential (primary) hypertension: Secondary | ICD-10-CM | POA: Diagnosis present

## 2017-03-06 DIAGNOSIS — Z9114 Patient's other noncompliance with medication regimen: Secondary | ICD-10-CM

## 2017-03-06 DIAGNOSIS — R45851 Suicidal ideations: Secondary | ICD-10-CM

## 2017-03-06 DIAGNOSIS — F250 Schizoaffective disorder, bipolar type: Secondary | ICD-10-CM | POA: Diagnosis present

## 2017-03-06 DIAGNOSIS — F909 Attention-deficit hyperactivity disorder, unspecified type: Secondary | ICD-10-CM | POA: Diagnosis present

## 2017-03-06 DIAGNOSIS — Z91010 Allergy to peanuts: Secondary | ICD-10-CM

## 2017-03-06 DIAGNOSIS — R509 Fever, unspecified: Secondary | ICD-10-CM | POA: Diagnosis present

## 2017-03-06 DIAGNOSIS — F17200 Nicotine dependence, unspecified, uncomplicated: Secondary | ICD-10-CM

## 2017-03-06 DIAGNOSIS — Z880 Allergy status to penicillin: Secondary | ICD-10-CM

## 2017-03-06 DIAGNOSIS — Z888 Allergy status to other drugs, medicaments and biological substances status: Secondary | ICD-10-CM

## 2017-03-06 DIAGNOSIS — Z720 Tobacco use: Secondary | ICD-10-CM | POA: Diagnosis present

## 2017-03-06 DIAGNOSIS — F319 Bipolar disorder, unspecified: Secondary | ICD-10-CM | POA: Diagnosis present

## 2017-03-06 LAB — RAPID URINE DRUG SCREEN, HOSP PERFORMED
Amphetamines: POSITIVE — AB
BARBITURATES: NOT DETECTED
Benzodiazepines: NOT DETECTED
Cocaine: POSITIVE — AB
Opiates: NOT DETECTED
TETRAHYDROCANNABINOL: POSITIVE — AB

## 2017-03-06 LAB — COMPREHENSIVE METABOLIC PANEL
ALBUMIN: 4.1 g/dL (ref 3.5–5.0)
ALK PHOS: 61 U/L (ref 38–126)
ALT: 20 U/L (ref 17–63)
ANION GAP: 10 (ref 5–15)
AST: 26 U/L (ref 15–41)
BUN: 11 mg/dL (ref 6–20)
CALCIUM: 8.9 mg/dL (ref 8.9–10.3)
CO2: 26 mmol/L (ref 22–32)
Chloride: 99 mmol/L — ABNORMAL LOW (ref 101–111)
Creatinine, Ser: 0.99 mg/dL (ref 0.61–1.24)
GFR calc Af Amer: >60 mL/min (ref >60)
GFR calc non Af Amer: >60 mL/min (ref >60)
GLUCOSE: 87 mg/dL (ref 65–99)
Potassium: 3.7 mmol/L (ref 3.5–5.1)
SODIUM: 135 mmol/L (ref 135–145)
Total Bilirubin: 0.5 mg/dL (ref 0.3–1.2)
Total Protein: 7.9 g/dL (ref 6.5–8.1)

## 2017-03-06 LAB — URINALYSIS, ROUTINE W REFLEX MICROSCOPIC
BILIRUBIN URINE: NEGATIVE
GLUCOSE, UA: 50 mg/dL — AB
HGB URINE DIPSTICK: NEGATIVE
Ketones, ur: NEGATIVE mg/dL
Leukocytes, UA: NEGATIVE
Nitrite: NEGATIVE
PH: 5 (ref 5.0–8.0)
Protein, ur: NEGATIVE mg/dL
SPECIFIC GRAVITY, URINE: 1.021 (ref 1.005–1.030)

## 2017-03-06 LAB — CBC WITH DIFFERENTIAL/PLATELET
BASOS PCT: 2 %
Basophils Absolute: 0.1 10*3/uL (ref 0.0–0.1)
EOS ABS: 0.1 10*3/uL (ref 0.0–0.7)
Eosinophils Relative: 1 %
HEMATOCRIT: 41.3 % (ref 39.0–52.0)
HEMOGLOBIN: 13.9 g/dL (ref 13.0–17.0)
LYMPHS PCT: 35 %
Lymphs Abs: 2.3 10*3/uL (ref 0.7–4.0)
MCH: 27.8 pg (ref 26.0–34.0)
MCHC: 33.7 g/dL (ref 30.0–36.0)
MCV: 82.6 fL (ref 78.0–100.0)
Monocytes Absolute: 0.8 10*3/uL (ref 0.1–1.0)
Monocytes Relative: 13 %
NEUTROS ABS: 3.2 10*3/uL (ref 1.7–7.7)
Neutrophils Relative %: 49 %
Platelets: 205 10*3/uL (ref 150–400)
RBC: 5 MIL/uL (ref 4.22–5.81)
RDW: 15 % (ref 11.5–15.5)
WBC: 6.5 10*3/uL (ref 4.0–10.5)

## 2017-03-06 LAB — ETHANOL

## 2017-03-06 MED ORDER — DOCUSATE SODIUM 100 MG PO CAPS
100.0000 mg | ORAL_CAPSULE | Freq: Two times a day (BID) | ORAL | Status: DC
  Administered 2017-03-06 – 2017-03-11 (×9): 100 mg via ORAL
  Filled 2017-03-06 (×12): qty 1

## 2017-03-06 MED ORDER — TRAZODONE HCL 50 MG PO TABS
50.0000 mg | ORAL_TABLET | Freq: Every evening | ORAL | Status: DC | PRN
  Administered 2017-03-06 – 2017-03-12 (×5): 50 mg via ORAL
  Filled 2017-03-06 (×6): qty 1

## 2017-03-06 MED ORDER — ACETAMINOPHEN 650 MG RE SUPP: 650.0000 mg | Freq: Four times a day (QID) | RECTAL | Status: DC | PRN

## 2017-03-06 MED ORDER — IBUPROFEN 200 MG PO TABS
600.0000 mg | ORAL_TABLET | Freq: Once | ORAL | Status: AC
  Administered 2017-03-06: 600 mg via ORAL
  Filled 2017-03-06: qty 3

## 2017-03-06 MED ORDER — GABAPENTIN 100 MG PO CAPS
100.0000 mg | ORAL_CAPSULE | Freq: Three times a day (TID) | ORAL | Status: DC
Start: 2017-03-06 — End: 2017-03-12
  Administered 2017-03-06 – 2017-03-12 (×18): 100 mg via ORAL
  Filled 2017-03-06 (×18): qty 1

## 2017-03-06 MED ORDER — DEXTROSE 5 % IV SOLN
10.0000 mg/kg | Freq: Once | INTRAVENOUS | Status: AC
  Administered 2017-03-06: 725 mg via INTRAVENOUS
  Filled 2017-03-06: qty 14.5

## 2017-03-06 MED ORDER — ONDANSETRON HCL 4 MG PO TABS: 4.0000 mg | ORAL_TABLET | Freq: Four times a day (QID) | ORAL | Status: DC | PRN

## 2017-03-06 MED ORDER — KETOROLAC TROMETHAMINE 30 MG/ML IJ SOLN: 30.0000 mg | Freq: Four times a day (QID) | INTRAMUSCULAR | Status: AC | PRN

## 2017-03-06 MED ORDER — ONDANSETRON HCL 4 MG/2ML IJ SOLN: 4.0000 mg | Freq: Four times a day (QID) | INTRAMUSCULAR | Status: DC | PRN

## 2017-03-06 MED ORDER — ACETAMINOPHEN 325 MG PO TABS
650.0000 mg | ORAL_TABLET | Freq: Four times a day (QID) | ORAL | Status: DC | PRN
  Administered 2017-03-07: 650 mg via ORAL
  Filled 2017-03-06 (×2): qty 2

## 2017-03-06 MED ORDER — FAMOTIDINE 20 MG PO TABS
20.0000 mg | ORAL_TABLET | Freq: Every day | ORAL | Status: DC
Start: 2017-03-06 — End: 2017-03-12
  Administered 2017-03-06 – 2017-03-12 (×7): 20 mg via ORAL
  Filled 2017-03-06 (×6): qty 1

## 2017-03-06 MED ORDER — NICOTINE 7 MG/24HR TD PT24
7.0000 mg | MEDICATED_PATCH | Freq: Every day | TRANSDERMAL | Status: DC
  Administered 2017-03-07 – 2017-03-12 (×6): 7 mg via TRANSDERMAL
  Filled 2017-03-06 (×7): qty 1

## 2017-03-06 MED ORDER — DEXTROSE 5 % IV SOLN
10.0000 mg/kg | Freq: Three times a day (TID) | INTRAVENOUS | Status: DC
  Administered 2017-03-07 – 2017-03-08 (×5): 725 mg via INTRAVENOUS
  Filled 2017-03-06 (×5): qty 14.5

## 2017-03-06 MED ORDER — TRAMADOL HCL 50 MG PO TABS
50.0000 mg | ORAL_TABLET | Freq: Four times a day (QID) | ORAL | Status: DC | PRN
  Administered 2017-03-06 – 2017-03-12 (×7): 50 mg via ORAL
  Filled 2017-03-06 (×8): qty 1

## 2017-03-06 MED ORDER — SODIUM CHLORIDE 0.9 % IV BOLUS (SEPSIS)
500.0000 mL | Freq: Once | INTRAVENOUS | Status: AC
  Administered 2017-03-06: 500 mL via INTRAVENOUS

## 2017-03-06 NOTE — ED Notes (Signed)
Hospitalist at bedside 

## 2017-03-06 NOTE — H&P (Signed)
History and Physical    Edwin Martinez MWU:[id number] DOB: June 25, 1986 DOA: 03/06/2017  PCP: Judyann Munson, MD Consultants:  None Patient coming from: homeless; NOK: no one.  His only reported living relatives are his brother (incarcerated for 2 more years) and a nephew.  Chief Complaint: rash  HPI: Edwin Martinez is a 31 y.o. male with medical history significant of untreated HIV infection; polysubstance abuse; and bipolar with schizoaffective d/o presenting with "shingles and suicidal thoughts".  Today is the third day with rash - came in on 6/7 and was diagnosed with allergic reaction.  No itch, just pain. R upper chest and back, neck, shoulder.  No h/o shingles.  He has not been taking HIV medications for the last 2-3 months due to homelessness and transportation issues; he has an appointment in the ID clinic later this month.    He hears voices that tell him to jump in front of vehicles, to jump off buildings, and that people are out to get him.  Always scared, depressed.  +h/o multiple suicidal attempts - his last was an OD about 3-4 months ago, was hospitalized at [facility] about 5-6 days.  Usually after these hospitalizations it helps him, but not so this time due to previously mentioned homelessness/transportation issues.   ED Course:  Zoster - acyclovir 725 mg IV; 500 mL NS bolus; discussion with Dr. Ninetta Lights; consult to TTS - recommended disposition is for inpatient treatment program once medically cleared  Review of Systems: As per HPI; otherwise review of systems reviewed and negative.   Ambulatory Status:  Ambulates without assistance  Past Medical History:  Diagnosis Date  . ADHD (attention deficit hyperactivity disorder) 09/12/2012  . Anxiety   . Asthma   . Bipolar 1 disorder (HCC)   . HIV (human immunodeficiency virus infection) (HCC)   . Hypertension   . Schizophrenia (HCC)   . Seizures (HCC)     Past Surgical History:  Procedure Laterality Date  .  DENTAL SURGERY      Social History   Social History  . Marital status: Single    Spouse name: N/A  . Number of children: N/A  . Years of education: N/A   Occupational History  . disability pending    Social History Main Topics  . Smoking status: Current Every Day Smoker    Packs/day: 0.50    Types: Cigarettes    Start date: 09/29/1991  . Smokeless tobacco: Never Used  . Alcohol use 1.2 oz/week    2 Standard drinks or equivalent per week     Comment: little recently  . Drug use: Yes    Types: Cocaine, Marijuana     Comment: smoked marijuana 1-2 days ago  . Sexual activity: Yes    Birth control/ protection: Condom   Other Topics Concern  . Not on file   Social History Narrative   ** Merged History Encounter **        Allergies  Allergen Reactions  . Magnesium-Containing Compounds Other (See Comments)    This medication is contraindicated with pts HIV meds.    . Peanut-Containing Drug Products Anaphylaxis  . Esomeprazole Magnesium Cough  . Atripla [Efavirenz-Emtricitab-Tenofovir] Other (See Comments)    Reaction:  Suicidal thoughts   . Bactrim [Sulfamethoxazole-Trimethoprim] Rash  . Penicillins Rash and Other (See Comments)    Has patient had a PCN reaction causing immediate rash, facial/tongue/throat swelling, SOB or lightheadedness with hypotension: Yes Has patient had a PCN reaction causing severe rash involving mucus  membranes or skin necrosis: No Has patient had a PCN reaction that required hospitalization No Has patient had a PCN reaction occurring within the last 10 years: No If all of the above answers are "NO", then may proceed with Cephalosporin use.    Family History  Problem Relation Age of Onset  . Huntington's disease Father   . Heart disease Mother   . Suicidality Maternal Uncle   . Suicidality Maternal Grandmother     Prior to Admission medications   Medication Sig Start Date End Date Taking? Authorizing Provider  albuterol (PROVENTIL  HFA;VENTOLIN HFA) 108 (90 Base) MCG/ACT inhaler Inhale 2 puffs into the lungs every 6 (six) hours as needed for wheezing or shortness of breath. Patient not taking: Reported on 03/04/2017 12/28/16   Oneta Rack, NP  busPIRone (BUSPAR) 5 MG tablet Take 1 tablet (5 mg total) by mouth 2 (two) times daily. Patient not taking: Reported on 03/04/2017 12/28/16   Oneta Rack, NP  divalproex (DEPAKOTE ER) 250 MG 24 hr tablet Take 1 tablet (250 mg total) by mouth daily. Patient not taking: Reported on 03/04/2017 12/29/16   Oneta Rack, NP  FLUoxetine (PROZAC) 10 MG capsule Take 1 capsule (10 mg total) by mouth daily. Patient not taking: Reported on 03/04/2017 12/29/16   Oneta Rack, NP  gabapentin (NEURONTIN) 100 MG capsule Take 1 capsule (100 mg total) by mouth 3 (three) times daily. Patient not taking: Reported on 03/04/2017 12/28/16   Oneta Rack, NP  risperiDONE (RISPERDAL) 1 MG tablet Take 1 tablet (1 mg total) by mouth 2 (two) times daily. Patient not taking: Reported on 03/04/2017 12/28/16   Oneta Rack, NP  traZODone (DESYREL) 50 MG tablet Take 1 tablet (50 mg total) by mouth at bedtime as needed for sleep. Patient not taking: Reported on 03/04/2017 12/28/16   Oneta Rack, NP    Physical Exam: Vitals:   03/06/17 1915 03/06/17 1945 03/06/17 2000 03/06/17 2059  BP: 123/79 121/79 123/69 135/74  Pulse: 93 86 87 92  Resp:    16  Temp:    (!) 101 F (38.3 C)  TempSrc:    Oral  SpO2: 100% 100% 99% 100%  Weight:    70.6 kg (155 lb 10.3 oz)  Height:    5\' 7"  (1.702 m)     General:  Appears calm and comfortable and is NAD Eyes:  PERRL, EOMI, normal lids, iris ENT:  grossly normal hearing, lips & tongue, mmm Neck:  no LAD, masses or thyromegaly Cardiovascular:  RRR, no m/r/g. No LE edema.  Respiratory:  CTA bilaterally, no w/r/r. Normal respiratory effort. Abdomen:  soft, ntnd, NABS Skin:  Vesicular eruption that appears to be clearly dermatomal, R posterior neck and posterior upper shoulder  and lateral neck and lateral anterior arm to mid-bicep region and across right upper chest around the clavicular area; there is surrounding erythema.  Further skin exam reveals no additional lesions. Musculoskeletal:  grossly normal tone BUE/BLE, good ROM, no bony abnormality Psychiatric:  depressed mood and flat affect, speech fluent and appropriate, AOx3, not obviously responding to internal stimuli Neurologic:  CN 2-12 grossly intact, moves all extremities in coordinated fashion, sensation intact  Labs on Admission: I have personally reviewed following labs and imaging studies  CBC:  Recent Labs Lab 03/06/17 1707  WBC 6.5  NEUTROABS 3.2  HGB 13.9  HCT 41.3  MCV 82.6  PLT 205   Basic Metabolic Panel:  Recent Labs Lab 03/06/17 1707  NA 135  K 3.7  CL 99*  CO2 26  GLUCOSE 87  BUN 11  CREATININE 0.99  CALCIUM 8.9   GFR: Estimated Creatinine Clearance: 101.1 mL/min (by C-G formula based on SCr of 0.99 mg/dL). Liver Function Tests:  Recent Labs Lab 03/06/17 1707  AST 26  ALT 20  ALKPHOS 61  BILITOT 0.5  PROT 7.9  ALBUMIN 4.1   No results for input(s): LIPASE, AMYLASE in the last 168 hours. No results for input(s): AMMONIA in the last 168 hours. Coagulation Profile: No results for input(s): INR, PROTIME in the last 168 hours. Cardiac Enzymes: No results for input(s): CKTOTAL, CKMB, CKMBINDEX, TROPONINI in the last 168 hours. BNP (last 3 results) No results for input(s): PROBNP in the last 8760 hours. HbA1C: No results for input(s): HGBA1C in the last 72 hours. CBG: No results for input(s): GLUCAP in the last 168 hours. Lipid Profile: No results for input(s): CHOL, HDL, LDLCALC, TRIG, CHOLHDL, LDLDIRECT in the last 72 hours. Thyroid Function Tests: No results for input(s): TSH, T4TOTAL, FREET4, T3FREE, THYROIDAB in the last 72 hours. Anemia Panel: No results for input(s): VITAMINB12, FOLATE, FERRITIN, TIBC, IRON, RETICCTPCT in the last 72 hours. Urine  analysis:    Component Value Date/Time   COLORURINE YELLOW 03/06/2017 1707   APPEARANCEUR CLEAR 03/06/2017 1707   LABSPEC 1.021 03/06/2017 1707   PHURINE 5.0 03/06/2017 1707   GLUCOSEU 50 (A) 03/06/2017 1707   GLUCOSEU NEG mg/dL 16/10/960408/03/2007 54092041   HGBUR NEGATIVE 03/06/2017 1707   BILIRUBINUR NEGATIVE 03/06/2017 1707   KETONESUR NEGATIVE 03/06/2017 1707   PROTEINUR NEGATIVE 03/06/2017 1707   UROBILINOGEN 1 09/06/2012 1106   NITRITE NEGATIVE 03/06/2017 1707   LEUKOCYTESUR NEGATIVE 03/06/2017 1707    Creatinine Clearance: Estimated Creatinine Clearance: 101.1 mL/min (by C-G formula based on SCr of 0.99 mg/dL).  Sepsis Labs: @LABRCNTIP (procalcitonin:4,lacticidven:4) )No results found for this or any previous visit (from the past 240 hour(s)).   Radiological Exams on Admission: Dg Chest 2 View  Result Date: 03/06/2017 CLINICAL DATA:  31 year old male with history of suicidal ideation. Widespread vesicular rash. Possible shingles. EXAM: CHEST  2 VIEW COMPARISON:  No priors. FINDINGS: Lung volumes are normal. No consolidative airspace disease. No pleural effusions. No pneumothorax. No pulmonary nodule or mass noted. Pulmonary vasculature and the cardiomediastinal silhouette are within normal limits. IMPRESSION: No radiographic evidence of acute cardiopulmonary disease. Electronically Signed   By: Trudie Reedaniel  Entrikin M.D.   On: 03/06/2017 17:30    EKG: not done  Assessment/Plan Principal Problem:   Herpes zoster Active Problems:   Human immunodeficiency virus (HIV) disease (HCC)   Tobacco use disorder   Suicidal ideation   Polysubstance abuse   Homelessness   Herpes zoster -Classic, distinctive rash that is vesicular and dermatomal -Based on his immunocompromised state, observation is reasonable for IV antivirals and close monitoring. -pain control -Airborne and contact precautions for now  Untreated HIV infection -Last seen in HIV Clinic in Dec 2016 -He does have an appointment  for recheck later this month -Viral load 231,000 on 12/17/16 -Will check CD4 count and viral load now so that this information will be available at his ID follow-up appointment  Tobacco dependence -Tobacco Dependence: encourage cessation.  This was discussed with the patient and should be reviewed on an ongoing basis.   -Patch ordered.  Suicidal ideation -24 hour sitter for now -Needs inpatient treatment program once medically cleared -Will not resume prior medications at this time since those medications may be changed during his  impending behavioral health hospitalization  Polysubstance abuse -UDS positive for amphetamines, cocaine, and THC -Needs ongoing counseling regarding the importance of cessation, particularly in light of his other medical issues  Homelessness -SW consult -Needs assistance and encouragement regarding medication compliance, physician follow-up  DVT prophylaxis: Early ambulation Code Status: Full  Family Communication: None present  Disposition Plan:   To inpatient psych once clinically improved Consults called: Psychiatry and ID by ER Admission status: It is my clinical opinion that referral for OBSERVATION is reasonable and necessary in this patient based on the above information provided. The aforementioned taken together are felt to place the patient at high risk for further clinical deterioration. However it is anticipated that the patient may be medically stable for discharge from the hospital within 24 to 48 hours.    Jonah Blue MD [facility]  If 7PM-7AM, please contact night-coverage [email or web address] Password [email or web address]  03/06/2017, 9:45 PM

## 2017-03-06 NOTE — ED Triage Notes (Signed)
He c/o "having suicidal thoughts today" also c/o widespread vesicular rash ?rhus?

## 2017-03-06 NOTE — Progress Notes (Signed)
Pharmacy Antibiotic Note  Edwin Martinez is a 31 y.o. male admitted on 03/06/2017 with vesicular rash.  Patient presents with having suicidal thoughts and c/o vesicular rash.  Seen in ED on 6/7 for rash.  PMH signficant for HIV.  Pharmacy has been consulted for Acyclovir dosing.  Plan: Acyclovir 10mg /kg IV q8h Follow renal function Follow clinical course   Temp (24hrs), Avg:99.2 F (37.3 C), Min:99.2 F (37.3 C), Max:99.2 F (37.3 C)   Recent Labs Lab 03/06/17 1707  WBC 6.5  CREATININE 0.99    Estimated Creatinine Clearance: 101.1 mL/min (by C-G formula based on SCr of 0.99 mg/dL).    Allergies  Allergen Reactions  . Magnesium-Containing Compounds Other (See Comments)    This medication is contraindicated with pts HIV meds.    . Peanut-Containing Drug Products Anaphylaxis  . Esomeprazole Magnesium Cough  . Atripla [Efavirenz-Emtricitab-Tenofovir] Other (See Comments)    Reaction:  Suicidal thoughts   . Bactrim [Sulfamethoxazole-Trimethoprim] Rash  . Penicillins Rash and Other (See Comments)    Has patient had a PCN reaction causing immediate rash, facial/tongue/throat swelling, SOB or lightheadedness with hypotension: Yes Has patient had a PCN reaction causing severe rash involving mucus membranes or skin necrosis: No Has patient had a PCN reaction that required hospitalization No Has patient had a PCN reaction occurring within the last 10 years: No If all of the above answers are "NO", then may proceed with Cephalosporin use.    Antimicrobials this admission: 6/9 acyclovir >>    Thank you for allowing pharmacy to be a part of this patient's care.  Maryellen PilePoindexter, Ladarrious Kirksey Trefz, PharmD 03/06/2017 8:39 PM

## 2017-03-06 NOTE — BH Assessment (Addendum)
Tele Assessment Note   Edwin Martinez is an 31 y.o. male who presents to the ED voluntarily. Pt reports he has been increasingly depressed due to being homeless and he has a plan to jump in front of a car. Pt reports he has been homeless for the past year and a half. Pt reports he also experiences AH that tell him he is worthless and he also reports feeling "afraid" due to the voices and feeling that people are out to get him. Pt reports he has been experiencing AH for years, however he used to take medication to assist with the voices but he has no current psychiatrist that prescribes the medication. Pt reports he has not been sleeping well and reports to sleeping for about 3 or 4 hours each night. Pt has a hx of inpt hospitalizations due to SI and schizoaffective d/o. Pt reports he has attempted suicide 5x in the past.    Inpt treatment per Hillery Jacksanika Lewis, NP. Per Isaias CowmanAllan, RN pt is going to the medical floor for treatment of shingles.    Diagnosis: Schizophrenia; Cocaine Use D/O; Cannabis Use D/O  Past Medical History:  Past Medical History:  Diagnosis Date  . ADHD (attention deficit hyperactivity disorder) 09/12/2012  . Anxiety   . Asthma   . Bipolar 1 disorder (HCC)   . HIV (human immunodeficiency virus infection) (HCC)   . Hypertension   . Schizophrenia (HCC)   . Seizures (HCC)     Past Surgical History:  Procedure Laterality Date  . DENTAL SURGERY      Family History:  Family History  Problem Relation Age of Onset  . Huntington's disease Father   . Heart disease Mother   . Suicidality Maternal Uncle   . Suicidality Maternal Grandmother     Social History:  reports that he has been smoking Cigarettes.  He started smoking about 25 years ago. He has been smoking about 0.50 packs per day. He has never used smokeless tobacco. He reports that he drinks about 1.2 oz of alcohol per week . He reports that he uses drugs, including Cocaine and Marijuana.  Additional Social History:   Alcohol / Drug Use Pain Medications: See MAR Prescriptions: See MAR Over the Counter: See MAR History of alcohol / drug use?: Yes Longest period of sobriety (when/how long): UNKNOWN Substance #1 Name of Substance 1: Marijuana  1 - Age of First Use: unknown, pt denies use, Labs are positive on arrival to ED 1 - Amount (size/oz): unknown, pt denies use, Labs are positive on arrival to ED 1 - Frequency: unknown, pt denies use, Labs are positive on arrival to ED 1 - Duration: unknown, pt denies use, Labs are positive on arrival to ED 1 - Last Use / Amount: unknown, pt denies use, Labs are positive on arrival to ED Substance #2 Name of Substance 2: Cocaine 2 - Age of First Use: unknown, pt denies use, Labs are positive on arrival to ED 2 - Amount (size/oz): unknown, pt denies use, Labs are positive on arrival to ED 2 - Frequency: unknown, pt denies use, Labs are positive on arrival to ED 2 - Duration: unknown, pt denies use, Labs are positive on arrival to ED 2 - Last Use / Amount: unknown, pt denies use, Labs are positive on arrival to ED Substance #3 Name of Substance 3: Amphetamines 3 - Age of First Use: unknown, pt denies use, Labs are positive on arrival to ED 3 - Amount (size/oz): unknown, pt denies use,  Labs are positive on arrival to ED 3 - Frequency: unknown, pt denies use, Labs are positive on arrival to ED 3 - Duration: unknown, pt denies use, Labs are positive on arrival to ED 3 - Last Use / Amount: unknown, pt denies use, Labs are positive on arrival to ED  CIWA: CIWA-Ar BP: 123/69 Pulse Rate: 87 COWS:    PATIENT STRENGTHS: (choose at least two) Capable of independent living Communication skills Motivation for treatment/growth  Allergies:  Allergies  Allergen Reactions  . Magnesium-Containing Compounds Other (See Comments)    This medication is contraindicated with pts HIV meds.    . Peanut-Containing Drug Products Anaphylaxis  . Esomeprazole Magnesium Cough  .  Atripla [Efavirenz-Emtricitab-Tenofovir] Other (See Comments)    Reaction:  Suicidal thoughts   . Bactrim [Sulfamethoxazole-Trimethoprim] Rash  . Penicillins Rash and Other (See Comments)    Has patient had a PCN reaction causing immediate rash, facial/tongue/throat swelling, SOB or lightheadedness with hypotension: Yes Has patient had a PCN reaction causing severe rash involving mucus membranes or skin necrosis: No Has patient had a PCN reaction that required hospitalization No Has patient had a PCN reaction occurring within the last 10 years: No If all of the above answers are "NO", then may proceed with Cephalosporin use.    Home Medications:  (Not in a hospital admission)  OB/GYN Status:  No LMP for male patient.  General Assessment Data Location of Assessment: [facility] ED TTS Assessment: In system Is this a Tele or Face-to-Face Assessment?: Face-to-Face Is this an Initial Assessment or a Re-assessment for this encounter?: Initial Assessment Marital status: Single Is patient pregnant?: No Pregnancy Status: No Living Arrangements: Other (Comment) (Homeless) Can pt return to current living arrangement?: Yes Admission Status: Voluntary Is patient capable of signing voluntary admission?: Yes Referral Source: Self/Family/Friend Insurance type: none     Crisis Care Plan Living Arrangements: Other (Comment) (Homeless) Name of Psychiatrist: none Name of Therapist: none  Education Status Is patient currently in school?: No Highest grade of school patient has completed: 8th  Risk to self with the past 6 months Suicidal Ideation: Yes-Currently Present Has patient been a risk to self within the past 6 months prior to admission? : Yes Suicidal Intent: Yes-Currently Present Has patient had any suicidal intent within the past 6 months prior to admission? : Yes Is patient at risk for suicide?: Yes Suicidal Plan?: Yes-Currently Present Has patient had any suicidal plan within the past 6  months prior to admission? : Yes Specify Current Suicidal Plan: pt reports a plan to jump in front of a car Access to Means: Yes Specify Access to Suicidal Means: pt has access to traffic  What has been your use of drugs/alcohol within the last 12 months?: denies use, labs show positive for cocaine, cannabis, and Amphetamines on arrival to ED  Previous Attempts/Gestures: Yes How many times?: 5 Triggers for Past Attempts: Unpredictable, Hallucinations Intentional Self Injurious Behavior: None Family Suicide History: Yes (mother attempted, grandmother and uncle committed suicide ) Recent stressful life event(s): Financial Problems, Recent negative physical changes Persecutory voices/beliefs?: No Depression: Yes Depression Symptoms: Despondent, Insomnia, Isolating, Fatigue, Guilt, Loss of interest in usual pleasures, Feeling worthless/self pity, Feeling angry/irritable Substance abuse history and/or treatment for substance abuse?: Yes Suicide prevention information given to non-admitted patients: Not applicable  Risk to Others within the past 6 months Homicidal Ideation: No Does patient have any lifetime risk of violence toward others beyond the six months prior to admission? : No Thoughts of Harm  to Others: No Current Homicidal Intent: No Current Homicidal Plan: No Access to Homicidal Means: No History of harm to others?: No Assessment of Violence: None Noted Does patient have access to weapons?: No Criminal Charges Pending?: No Does patient have a court date: No Is patient on probation?: No  Psychosis Hallucinations: Auditory Delusions: None noted  Mental Status Report Appearance/Hygiene: In scrubs Eye Contact: Good Motor Activity: Freedom of movement Speech: Logical/coherent Level of Consciousness: Alert Mood: Depressed, Anxious, Despair, Helpless Affect: Depressed, Sad Anxiety Level: Minimal Thought Processes: Coherent, Relevant Judgement: Partial Orientation: Person,  Place, Time, Situation, Appropriate for developmental age Obsessive Compulsive Thoughts/Behaviors: None  Cognitive Functioning Concentration: Normal Memory: Remote Intact, Recent Intact IQ: Average Insight: Fair Impulse Control: Fair Appetite: Poor Sleep: Decreased Total Hours of Sleep: 3 Vegetative Symptoms: None  ADLScreening [facility] Assessment Services) Patient's cognitive ability adequate to safely complete daily activities?: Yes Patient able to express need for assistance with ADLs?: Yes Independently performs ADLs?: Yes (appropriate for developmental age)  Prior Inpatient Therapy Prior Inpatient Therapy: Yes Prior Therapy Dates: 2018, 2017 Prior Therapy Facilty/Provider(s): [facility], [facility] Reason for Treatment: SCHIZOAFFECTIVE   Prior Outpatient Therapy Prior Outpatient Therapy: No Does patient have an ACCT team?: No Does patient have Intensive In-House Services?  : No Does patient have [name] services? : No Does patient have P4CC services?: No  ADL Screening (condition at time of admission) Patient's cognitive ability adequate to safely complete daily activities?: Yes Is the patient deaf or have difficulty hearing?: No Does the patient have difficulty seeing, even when wearing glasses/contacts?: No Does the patient have difficulty concentrating, remembering, or making decisions?: No Patient able to express need for assistance with ADLs?: Yes Does the patient have difficulty dressing or bathing?: No Independently performs ADLs?: Yes (appropriate for developmental age) Does the patient have difficulty walking or climbing stairs?: No Weakness of Legs: None Weakness of Arms/Hands: None  Home Assistive Devices/Equipment Home Assistive Devices/Equipment: None    Abuse/Neglect Assessment (Assessment to be complete while patient is alone) Physical Abuse: Denies Verbal Abuse: Denies Sexual Abuse: Denies Exploitation of patient/patient's resources: Denies Self-Neglect:  Denies     Merchant [employer] officer (For Healthcare) Does Patient Have a Medical Advance Directive?: No Would patient like information on creating a medical advance directive?: No - Patient declined    Additional Information 1:1 In Past 12 Months?: No CIRT Risk: No Elopement Risk: No Does patient have medical clearance?: No     Disposition:  Disposition Initial Assessment Completed for this Encounter: Yes Disposition of Patient: Inpatient treatment program Type of inpatient treatment program: Adult (PER Hillery Jacks, NP WHEN MEDICALLY CLEARED)  Karolee Ohs 03/06/2017 8:21 PM

## 2017-03-06 NOTE — ED Provider Notes (Signed)
[facility]-EMERGENCY DEPT Provider Note   CSN: [id number] Arrival date & time: 03/06/17  1552     History   Chief Complaint Chief Complaint  Patient presents with  . Suicidal  . Allergic Reaction    HPI Edwin Martinez is a 31 y.o. male.  The history is provided by the patient. No language interpreter was used.  Allergic Reaction    Edwin Martinez is a 31 y.o. male who presents to the Emergency Department complaining of rash and SI.  He reports rash to the right side of his back, shoulder and chest that's been present for the last 2 days. It is slightly burning in sensation in the little bit itchy. He also endorses associated mild nonproductive cough. He denies any fevers, vomiting, nausea, vomiting. He also endorses suicidal thoughts for the last 2 days. He has been off of all of his medications for the last several weeks. He endorses auditory hallucinations telling him to harm himself. Symptoms are severe, constant, worsening.  Past Medical History:  Diagnosis Date  . ADHD (attention deficit hyperactivity disorder) 09/12/2012  . Anxiety   . Asthma   . Bipolar 1 disorder (HCC)   . HIV (human immunodeficiency virus infection) (HCC)   . Hypertension   . Schizophrenia (HCC)   . Seizures [facility](HCC)     Patient Active Problem List   Diagnosis Date Noted  . Herpes zoster 03/06/2017  . Polysubstance abuse 03/06/2017  . Homelessness 03/06/2017  . Schizoaffective disorder, bipolar type (HCC) 12/23/2016  . Suicidal ideation   . Cocaine use disorder, mild, abuse 07/27/2016  . Cannabis use disorder, moderate, dependence (HCC) 07/27/2016  . Tobacco use disorder 07/27/2016  . Intentional drug overdose (HCC) 07/18/2016  . Bipolar disorder, current episode depressed, mild (HCC) 05/04/2016  . Asthma 05/20/2007  . Human immunodeficiency virus (HIV) disease (HCC) 05/05/2007    Past Surgical History:  Procedure Laterality Date  . DENTAL SURGERY         Home Medications    Prior to  Admission medications   Medication Sig Start Date End Date Taking? Authorizing Provider  albuterol (PROVENTIL HFA;VENTOLIN HFA) 108 (90 Base) MCG/ACT inhaler Inhale 2 puffs into the lungs every 6 (six) hours as needed for wheezing or shortness of breath. Patient not taking: Reported on 03/04/2017 12/28/16   Oneta RackLewis, Tanika N, NP  busPIRone (BUSPAR) 5 MG tablet Take 1 tablet (5 mg total) by mouth 2 (two) times daily. Patient not taking: Reported on 03/04/2017 12/28/16   Oneta RackLewis, Tanika N, NP  divalproex (DEPAKOTE ER) 250 MG 24 hr tablet Take 1 tablet (250 mg total) by mouth daily. Patient not taking: Reported on 03/04/2017 12/29/16   Oneta RackLewis, Tanika N, NP  FLUoxetine (PROZAC) 10 MG capsule Take 1 capsule (10 mg total) by mouth daily. Patient not taking: Reported on 03/04/2017 12/29/16   Oneta RackLewis, Tanika N, NP  gabapentin (NEURONTIN) 100 MG capsule Take 1 capsule (100 mg total) by mouth 3 (three) times daily. Patient not taking: Reported on 03/04/2017 12/28/16   Oneta RackLewis, Tanika N, NP  risperiDONE (RISPERDAL) 1 MG tablet Take 1 tablet (1 mg total) by mouth 2 (two) times daily. Patient not taking: Reported on 03/04/2017 12/28/16   Oneta RackLewis, Tanika N, NP  traZODone (DESYREL) 50 MG tablet Take 1 tablet (50 mg total) by mouth at bedtime as needed for sleep. Patient not taking: Reported on 03/04/2017 12/28/16   Oneta RackLewis, Tanika N, NP    Family History Family History  Problem Relation Age of Onset  .  Huntington's disease Father   . Heart disease Mother   . Suicidality Maternal Uncle   . Suicidality Maternal Grandmother     Social History Social History  Substance Use Topics  . Smoking status: Current Every Day Smoker    Packs/day: 0.50    Types: Cigarettes    Start date: 09/29/1991  . Smokeless tobacco: Never Used  . Alcohol use 1.2 oz/week    2 Standard drinks or equivalent per week     Comment: little recently     Allergies   Magnesium-containing compounds; Peanut-containing drug products; Esomeprazole magnesium; Atripla  [efavirenz-emtricitab-tenofovir]; Bactrim [sulfamethoxazole-trimethoprim]; and Penicillins   Review of Systems Review of Systems  All other systems reviewed and are negative.    Physical Exam Updated Vital Signs BP 135/74 (BP Location: Left Arm)   Pulse 92   Temp (!) 100.6 F (38.1 C) (Oral)   Resp 16   Ht 5\' 7"  (1.702 m)   Wt 70.6 kg (155 lb 10.3 oz)   SpO2 100%   BMI 24.38 kg/m   Physical Exam  Constitutional: He is oriented to person, place, and time. He appears well-developed and well-nourished.  HENT:  Head: Normocephalic and atraumatic.  Neck: Neck supple.  Significant anterior and posterior cervical lymphadenopathy  Cardiovascular: Normal rate and regular rhythm.   No murmur heard. Pulmonary/Chest: Effort normal and breath sounds normal. No respiratory distress.  Abdominal: Soft. There is no tenderness. There is no rebound and no guarding.  Musculoskeletal: He exhibits no edema or tenderness.  Neurological: He is alert and oriented to person, place, and time.  Skin: Skin is warm and dry.  Vesicular and coalescent rash to the right posterior neck, lateral neck, right posterior shoulder, lateral shoulder and right anterior chest. There are hemorrhagic components to this rash.  Psychiatric: He has a normal mood and affect. His behavior is normal.  Nursing note and vitals reviewed.        ED Treatments / Results  Labs (all labs ordered are listed, but only abnormal results are displayed) Labs Reviewed  COMPREHENSIVE METABOLIC PANEL - Abnormal; Notable for the following:       Result Value   Chloride 99 (*)    All other components within normal limits  URINALYSIS, ROUTINE W REFLEX MICROSCOPIC - Abnormal; Notable for the following:    Glucose, UA 50 (*)    All other components within normal limits  RAPID URINE DRUG SCREEN, HOSP PERFORMED - Abnormal; Notable for the following:    Cocaine POSITIVE (*)    Amphetamines POSITIVE (*)    Tetrahydrocannabinol  POSITIVE (*)    All other components within normal limits  CBC WITH DIFFERENTIAL/PLATELET  ETHANOL  BASIC METABOLIC PANEL  CBC    EKG  EKG Interpretation None       Radiology Dg Chest 2 View  Result Date: 03/06/2017 CLINICAL DATA:  31 year old male with history of suicidal ideation. Widespread vesicular rash. Possible shingles. EXAM: CHEST  2 VIEW COMPARISON:  No priors. FINDINGS: Lung volumes are normal. No consolidative airspace disease. No pleural effusions. No pneumothorax. No pulmonary nodule or mass noted. Pulmonary vasculature and the cardiomediastinal silhouette are within normal limits. IMPRESSION: No radiographic evidence of acute cardiopulmonary disease. Electronically Signed   By: Trudie Reed M.D.   On: 03/06/2017 17:30    Procedures Procedures (including critical care time)  Medications Ordered in ED Medications  gabapentin (NEURONTIN) capsule 100 mg (100 mg Oral Given 03/06/17 2102)  traZODone (DESYREL) tablet 50 mg (50 mg Oral  Given 03/06/17 2103)  acetaminophen (TYLENOL) tablet 650 mg (not administered)    Or  acetaminophen (TYLENOL) suppository 650 mg (not administered)  docusate sodium (COLACE) capsule 100 mg (100 mg Oral Given 03/06/17 2103)  ondansetron (ZOFRAN) tablet 4 mg (not administered)    Or  ondansetron (ZOFRAN) injection 4 mg (not administered)  acyclovir (ZOVIRAX) 725 mg in dextrose 5 % 150 mL IVPB (not administered)  ketorolac (TORADOL) 30 MG/ML injection 30 mg (not administered)  traMADol (ULTRAM) tablet 50 mg (50 mg Oral Given 03/06/17 2103)  nicotine (NICODERM CQ - dosed in mg/24 hr) patch 7 mg (7 mg Transdermal Not Given 03/06/17 2237)  famotidine (PEPCID) tablet 20 mg (20 mg Oral Given 03/06/17 2237)  acyclovir (ZOVIRAX) 725 mg in dextrose 5 % 150 mL IVPB (0 mg/kg  72.6 kg Intravenous Stopped 03/06/17 2007)  sodium chloride 0.9 % bolus 500 mL (500 mLs Intravenous New Bag/Given 03/06/17 1805)  ibuprofen (ADVIL,MOTRIN) tablet 600 mg (600 mg Oral Given  03/06/17 2237)     Initial Impression / Assessment and Plan / ED Course  I have reviewed the triage vital signs and the nursing notes.  Pertinent labs & imaging results that were available during my care of the patient were reviewed by me and considered in my medical decision making (see chart for details).     Patient with history of HIV and medical noncompliance here for rash to chest and back. Examination is concerning for zoster. Given his immunosuppression and severity of sxs plan to treat with IV acyclovir. Hospitalist consulted for admission for further treatment.  Final Clinical Impressions(s) / ED Diagnoses   Final diagnoses:  None    New Prescriptions Current Discharge Medication List       Tilden Fossa, MD 03/06/17 2355

## 2017-03-07 DIAGNOSIS — F1290 Cannabis use, unspecified, uncomplicated: Secondary | ICD-10-CM

## 2017-03-07 DIAGNOSIS — Z21 Asymptomatic human immunodeficiency virus [HIV] infection status: Secondary | ICD-10-CM

## 2017-03-07 DIAGNOSIS — F17210 Nicotine dependence, cigarettes, uncomplicated: Secondary | ICD-10-CM

## 2017-03-07 DIAGNOSIS — Z818 Family history of other mental and behavioral disorders: Secondary | ICD-10-CM

## 2017-03-07 DIAGNOSIS — B027 Disseminated zoster: Secondary | ICD-10-CM

## 2017-03-07 DIAGNOSIS — F1210 Cannabis abuse, uncomplicated: Secondary | ICD-10-CM

## 2017-03-07 DIAGNOSIS — Z881 Allergy status to other antibiotic agents status: Secondary | ICD-10-CM

## 2017-03-07 DIAGNOSIS — Z820 Family history of epilepsy and other diseases of the nervous system: Secondary | ICD-10-CM

## 2017-03-07 DIAGNOSIS — F1490 Cocaine use, unspecified, uncomplicated: Secondary | ICD-10-CM | POA: Diagnosis not present

## 2017-03-07 DIAGNOSIS — G4700 Insomnia, unspecified: Secondary | ICD-10-CM

## 2017-03-07 DIAGNOSIS — F1910 Other psychoactive substance abuse, uncomplicated: Secondary | ICD-10-CM | POA: Diagnosis not present

## 2017-03-07 DIAGNOSIS — Z888 Allergy status to other drugs, medicaments and biological substances status: Secondary | ICD-10-CM

## 2017-03-07 DIAGNOSIS — B029 Zoster without complications: Secondary | ICD-10-CM | POA: Diagnosis not present

## 2017-03-07 DIAGNOSIS — F1410 Cocaine abuse, uncomplicated: Secondary | ICD-10-CM

## 2017-03-07 DIAGNOSIS — Z91010 Allergy to peanuts: Secondary | ICD-10-CM

## 2017-03-07 DIAGNOSIS — R45851 Suicidal ideations: Secondary | ICD-10-CM | POA: Diagnosis not present

## 2017-03-07 DIAGNOSIS — F319 Bipolar disorder, unspecified: Secondary | ICD-10-CM

## 2017-03-07 DIAGNOSIS — Z8249 Family history of ischemic heart disease and other diseases of the circulatory system: Secondary | ICD-10-CM

## 2017-03-07 DIAGNOSIS — Z880 Allergy status to penicillin: Secondary | ICD-10-CM

## 2017-03-07 DIAGNOSIS — B20 Human immunodeficiency virus [HIV] disease: Secondary | ICD-10-CM

## 2017-03-07 DIAGNOSIS — Z590 Homelessness: Secondary | ICD-10-CM | POA: Diagnosis not present

## 2017-03-07 LAB — CBC
HEMATOCRIT: 41.5 % (ref 39.0–52.0)
Hemoglobin: 13.8 g/dL (ref 13.0–17.0)
MCH: 27.5 pg (ref 26.0–34.0)
MCHC: 33.3 g/dL (ref 30.0–36.0)
MCV: 82.7 fL (ref 78.0–100.0)
PLATELETS: 191 10*3/uL (ref 150–400)
RBC: 5.02 MIL/uL (ref 4.22–5.81)
RDW: 14.9 % (ref 11.5–15.5)
WBC: 5.8 10*3/uL (ref 4.0–10.5)

## 2017-03-07 LAB — BASIC METABOLIC PANEL
Anion gap: 6 (ref 5–15)
BUN: 11 mg/dL (ref 6–20)
CALCIUM: 8.6 mg/dL — AB (ref 8.9–10.3)
CO2: 27 mmol/L (ref 22–32)
CREATININE: 0.9 mg/dL (ref 0.61–1.24)
Chloride: 104 mmol/L (ref 101–111)
GFR calc Af Amer: >60 mL/min (ref >60)
GLUCOSE: 94 mg/dL (ref 65–99)
Potassium: 3.7 mmol/L (ref 3.5–5.1)
SODIUM: 137 mmol/L (ref 135–145)

## 2017-03-07 MED ORDER — LORAZEPAM 2 MG/ML IJ SOLN: 1.0000 mg | Freq: Four times a day (QID) | INTRAMUSCULAR | Status: AC | PRN

## 2017-03-07 MED ORDER — DIPHENHYDRAMINE HCL 25 MG PO CAPS
25.0000 mg | ORAL_CAPSULE | Freq: Four times a day (QID) | ORAL | Status: DC | PRN
  Administered 2017-03-07: 25 mg via ORAL
  Filled 2017-03-07: qty 1

## 2017-03-07 MED ORDER — LORAZEPAM 1 MG PO TABS: 1.0000 mg | ORAL_TABLET | Freq: Four times a day (QID) | ORAL | Status: AC | PRN

## 2017-03-07 MED ORDER — THIAMINE HCL 100 MG/ML IJ SOLN: 100.0000 mg | Freq: Every day | INTRAMUSCULAR | Status: DC

## 2017-03-07 MED ORDER — HYDROXYZINE HCL 25 MG PO TABS
25.0000 mg | ORAL_TABLET | ORAL | Status: DC | PRN
  Administered 2017-03-07 – 2017-03-08 (×3): 50 mg via ORAL
  Filled 2017-03-07 (×3): qty 2

## 2017-03-07 MED ORDER — VITAMIN B-1 100 MG PO TABS
100.0000 mg | ORAL_TABLET | Freq: Every day | ORAL | Status: DC
  Administered 2017-03-07 – 2017-03-08 (×2): 100 mg via ORAL
  Filled 2017-03-07 (×2): qty 1

## 2017-03-07 MED ORDER — ENOXAPARIN SODIUM 40 MG/0.4ML ~~LOC~~ SOLN
40.0000 mg | SUBCUTANEOUS | Status: DC
  Administered 2017-03-07 – 2017-03-12 (×4): 40 mg via SUBCUTANEOUS
  Filled 2017-03-07 (×5): qty 0.4

## 2017-03-07 MED ORDER — FOLIC ACID 1 MG PO TABS
1.0000 mg | ORAL_TABLET | Freq: Every day | ORAL | Status: DC
  Administered 2017-03-07 – 2017-03-12 (×6): 1 mg via ORAL
  Filled 2017-03-07 (×6): qty 1

## 2017-03-07 NOTE — Progress Notes (Signed)
    03/07/17 1600  Clinical Encounter Type  Visited With Patient  Visit Type Initial;Psychological support;Spiritual support;Social support  Referral From Nurse  Consult/Referral To Chaplain  Spiritual Encounters  Spiritual Needs Prayer;Emotional  Stress Factors  Patient Stress Factors Exhausted;Financial concerns;Health changes;Loss of control;Major life changes   Chaplain visited to provide emotional and spiritual support. Sitter was at bedside. Pt opened to the visited   Chaplain celebrated with Pt about his faith. Pt says he is a person of faith and that "God is his Biomedical engineerkeeper". Pt's faith is very important to him (and is one of the things "holding him together" during this time).  Outside of Pt's faith, the Pt lacks social support. There's also history of Pt befriending others with addictions. Pt is currently homeless and is in great need to social resources.   Pastoral Interventions: Prayer for stability  Explored hopes and fears  Explored control and trust issues  Explored faith relating to Pt's Health  Pt told stories and history of his medical and social life/ experiences   Pt was appreciative for visit and asked for a follow up.

## 2017-03-07 NOTE — Consult Note (Signed)
[facility] Consult   Reason for Consult:  SI, substance abuse Referring Physician:  Dr. Tyrell Antonio Patient Identification: Edwin Martinez MRN:  [id number] Principal Diagnosis: Herpes zoster Diagnosis:   Patient Active Problem List   Diagnosis Date Noted  . Herpes zoster [B02.9] 03/06/2017  . Polysubstance abuse [F19.10] 03/06/2017  . Homelessness [Z59.0] 03/06/2017  . Schizoaffective disorder, bipolar type ([place]) [F25.0] 12/23/2016  . Suicidal ideation [R45.851]   . Cocaine use disorder, mild, abuse [F14.10] 07/27/2016  . Cannabis use disorder, moderate, dependence ([place]) [F12.20] 07/27/2016  . Tobacco use disorder [F17.200] 07/27/2016  . Intentional drug overdose ([place]) [T50.902A] 07/18/2016  . Bipolar disorder, current episode depressed, mild ([place]) [F31.31] 05/04/2016  . Asthma [J45.909] 05/20/2007  . Human immunodeficiency virus (HIV) disease ([place]) [B20] 05/05/2007    Total Time spent with patient: 30 minutes  Subjective:   Edwin Martinez is a 31 y.o. male patient admitted to the medical floor with shingles.  Psychiatry consulted for SI with a plan to jump in front of a car.  He has a history of schizophrenia, bipolar, and polysubstance use.  HPI:  Edwin Martinez was pleasant and cooperative. Reports that he is significant pain from shingles. He was able to share that his homelessness and lack of social support and have continued to be a tremendous exacerbating factor for depression.  He is generally adherent to medications.  Takes Celexa, buspar, trazodone for mood.  Denies any significant drug or alcohol use the past few weeks.  (This is in contradiction to his UDS positive with Cocaine, amphetamines and THC).  He denies any daily alcohol use and denies any current withdrawal symptoms.  He continues to report depressed mood and feels that he may hurt himself. He reports that being in the hospital helps him to be safe with himself.  He agrees to remain on a voluntary  status at this time and is agreeable to psychiatric help.   Past Psychiatric History: multiple psychiatric hospitalizations, periodic outpatient treatment  Risk to Self: Suicidal Ideation: Yes-Currently Present Suicidal Intent: Yes-Currently Present Is patient at risk for suicide?: Yes Suicidal Plan?: Yes-Currently Present Specify Current Suicidal Plan: pt reports a plan to jump in front of a car Access to Means: Yes Specify Access to Suicidal Means: pt has access to traffic  What has been your use of drugs/alcohol within the last 12 months?: denies use, labs show positive for cocaine, cannabis, and Amphetamines on arrival to ED  How many times?: 5 Triggers for Past Attempts: Unpredictable, Hallucinations Intentional Self Injurious Behavior: None Risk to Others: Homicidal Ideation: No Thoughts of Harm to Others: No Current Homicidal Intent: No Current Homicidal Plan: No Access to Homicidal Means: No History of harm to others?: No Assessment of Violence: None Noted Does patient have access to weapons?: No Criminal Charges Pending?: No Does patient have a court date: No Prior Inpatient Therapy: Prior Inpatient Therapy: Yes Prior Therapy Dates: 2018, 2017 Prior Therapy Facilty/Provider(s): [facility], [place] Reason for Treatment: SCHIZOAFFECTIVE  Prior Outpatient Therapy: Prior Outpatient Therapy: No Does patient have an ACCT team?: No Does patient have Intensive In-House Services?  : No Does patient have [name] services? : No Does patient have P4CC services?: No  Past Medical History:  Past Medical History:  Diagnosis Date  . ADHD (attention deficit hyperactivity disorder) 09/12/2012  . Anxiety   . Asthma   . Bipolar 1 disorder ([place])   . HIV (human immunodeficiency virus infection) ([place])   . Hypertension   . Schizophrenia (  [place])   . Seizures ([place])     Past Surgical History:  Procedure Laterality Date  . DENTAL SURGERY     Family History:  Family History  Problem Relation Age  of Onset  . Huntington's disease Father   . Heart disease Mother   . Suicidality Maternal Uncle   . Suicidality Maternal Grandmother    Family Psychiatric  History: denies  Social History:  History  Alcohol Use  . 1.2 oz/week  . 2 Standard drinks or equivalent per week    Comment: little recently     History  Drug Use  . Types: Cocaine, Marijuana    Comment: smoked marijuana 1-2 days ago    Social History   Social History  . Marital status: Single    Spouse name: N/A  . Number of children: N/A  . Years of education: N/A   Occupational History  . disability pending    Social History Main Topics  . Smoking status: Current Every Day Smoker    Packs/day: 0.50    Types: Cigarettes    Start date: 09/29/1991  . Smokeless tobacco: Never Used  . Alcohol use 1.2 oz/week    2 Standard drinks or equivalent per week     Comment: little recently  . Drug use: Yes    Types: Cocaine, Marijuana     Comment: smoked marijuana 1-2 days ago  . Sexual activity: Yes    Birth control/ protection: Condom   Other Topics Concern  . None   Social History Narrative   ** Merged History Encounter **       Additional Social History:    Allergies:   Allergies  Allergen Reactions  . Magnesium-Containing Compounds Other (See Comments)    This medication is contraindicated with pts HIV meds.    . Peanut-Containing Drug Products Anaphylaxis  . Esomeprazole Magnesium Cough  . Atripla [Efavirenz-Emtricitab-Tenofovir] Other (See Comments)    Reaction:  Suicidal thoughts   . Bactrim [Sulfamethoxazole-Trimethoprim] Rash  . Penicillins Rash and Other (See Comments)    Has patient had a PCN reaction causing immediate rash, facial/tongue/throat swelling, SOB or lightheadedness with hypotension: Yes Has patient had a PCN reaction causing severe rash involving mucus membranes or skin necrosis: No Has patient had a PCN reaction that required hospitalization No Has patient had a PCN reaction  occurring within the last 10 years: No If all of the above answers are "NO", then may proceed with Cephalosporin use.    Labs:  Results for orders placed or performed during the hospital encounter of 03/06/17 (from the past 48 hour(s))  Comprehensive metabolic panel     Status: Abnormal   Collection Time: 03/06/17  5:07 PM  Result Value Ref Range   Sodium 135 135 - 145 mmol/L   Potassium 3.7 3.5 - 5.1 mmol/L   Chloride 99 (L) 101 - 111 mmol/L   CO2 26 22 - 32 mmol/L   Glucose, Bld 87 65 - 99 mg/dL   BUN 11 6 - 20 mg/dL   Creatinine, Ser 0.99 0.61 - 1.24 mg/dL   Calcium 8.9 8.9 - 10.3 mg/dL   Total Protein 7.9 6.5 - 8.1 g/dL   Albumin 4.1 3.5 - 5.0 g/dL   AST 26 15 - 41 U/L   ALT 20 17 - 63 U/L   Alkaline Phosphatase 61 38 - 126 U/L   Total Bilirubin 0.5 0.3 - 1.2 mg/dL   GFR calc non Af Amer >60 >60 mL/min   GFR calc Af  Amer >60 >60 mL/min    Comment: (NOTE) The eGFR has been calculated using the CKD EPI equation. This calculation has not been validated in all clinical situations. eGFR's persistently <60 mL/min signify possible Chronic Kidney Disease.    Anion gap 10 5 - 15  CBC with Differential     Status: None   Collection Time: 03/06/17  5:07 PM  Result Value Ref Range   WBC 6.5 4.0 - 10.5 K/uL   RBC 5.00 4.22 - 5.81 MIL/uL   Hemoglobin 13.9 13.0 - 17.0 g/dL   HCT 41.3 39.0 - 52.0 %   MCV 82.6 78.0 - 100.0 fL   MCH 27.8 26.0 - 34.0 pg   MCHC 33.7 30.0 - 36.0 g/dL   RDW 15.0 11.5 - 15.5 %   Platelets 205 150 - 400 K/uL   Neutrophils Relative % 49 %   Lymphocytes Relative 35 %   Monocytes Relative 13 %   Eosinophils Relative 1 %   Basophils Relative 2 %   Neutro Abs 3.2 1.7 - 7.7 K/uL   Lymphs Abs 2.3 0.7 - 4.0 K/uL   Monocytes Absolute 0.8 0.1 - 1.0 K/uL   Eosinophils Absolute 0.1 0.0 - 0.7 K/uL   Basophils Absolute 0.1 0.0 - 0.1 K/uL   WBC Morphology ATYPICAL LYMPHOCYTES   Urinalysis, Routine w reflex microscopic     Status: Abnormal   Collection Time:  03/06/17  5:07 PM  Result Value Ref Range   Color, Urine YELLOW YELLOW   APPearance CLEAR CLEAR   Specific Gravity, Urine 1.021 1.005 - 1.030   pH 5.0 5.0 - 8.0   Glucose, UA 50 (A) NEGATIVE mg/dL   Hgb urine dipstick NEGATIVE NEGATIVE   Bilirubin Urine NEGATIVE NEGATIVE   Ketones, ur NEGATIVE NEGATIVE mg/dL   Protein, ur NEGATIVE NEGATIVE mg/dL   Nitrite NEGATIVE NEGATIVE   Leukocytes, UA NEGATIVE NEGATIVE  Urine rapid drug screen (hosp performed)     Status: Abnormal   Collection Time: 03/06/17  5:07 PM  Result Value Ref Range   Opiates NONE DETECTED NONE DETECTED   Cocaine POSITIVE (A) NONE DETECTED   Benzodiazepines NONE DETECTED NONE DETECTED   Amphetamines POSITIVE (A) NONE DETECTED   Tetrahydrocannabinol POSITIVE (A) NONE DETECTED   Barbiturates NONE DETECTED NONE DETECTED    Comment:        DRUG SCREEN FOR MEDICAL PURPOSES ONLY.  IF CONFIRMATION IS NEEDED FOR ANY PURPOSE, NOTIFY LAB WITHIN 5 DAYS.        LOWEST DETECTABLE LIMITS FOR URINE DRUG SCREEN Drug Class       Cutoff (ng/mL) Amphetamine      1000 Barbiturate      200 Benzodiazepine   235 Tricyclics       361 Opiates          300 Cocaine          300 THC              50   Ethanol     Status: None   Collection Time: 03/06/17  5:07 PM  Result Value Ref Range   Alcohol, Ethyl (B) <5 <5 mg/dL    Comment:        LOWEST DETECTABLE LIMIT FOR SERUM ALCOHOL IS 5 mg/dL FOR MEDICAL PURPOSES ONLY   Basic metabolic panel     Status: Abnormal   Collection Time: 03/07/17  4:20 AM  Result Value Ref Range   Sodium 137 135 - 145 mmol/L   Potassium 3.7 3.5 -  5.1 mmol/L   Chloride 104 101 - 111 mmol/L   CO2 27 22 - 32 mmol/L   Glucose, Bld 94 65 - 99 mg/dL   BUN 11 6 - 20 mg/dL   Creatinine, Ser 0.90 0.61 - 1.24 mg/dL   Calcium 8.6 (L) 8.9 - 10.3 mg/dL   GFR calc non Af Amer >60 >60 mL/min   GFR calc Af Amer >60 >60 mL/min    Comment: (NOTE) The eGFR has been calculated using the CKD EPI equation. This  calculation has not been validated in all clinical situations. eGFR's persistently <60 mL/min signify possible Chronic Kidney Disease.    Anion gap 6 5 - 15  CBC     Status: None   Collection Time: 03/07/17  4:20 AM  Result Value Ref Range   WBC 5.8 4.0 - 10.5 K/uL   RBC 5.02 4.22 - 5.81 MIL/uL   Hemoglobin 13.8 13.0 - 17.0 g/dL   HCT 41.5 39.0 - 52.0 %   MCV 82.7 78.0 - 100.0 fL   MCH 27.5 26.0 - 34.0 pg   MCHC 33.3 30.0 - 36.0 g/dL   RDW 14.9 11.5 - 15.5 %   Platelets 191 150 - 400 K/uL    Current Facility-Administered Medications  Medication Dose Route Frequency Provider Last Rate Last Dose  . acetaminophen (TYLENOL) tablet 650 mg  650 mg Oral Q6H PRN Karmen Bongo, MD       Or  . acetaminophen (TYLENOL) suppository 650 mg  650 mg Rectal Q6H PRN Karmen Bongo, MD      . acyclovir (ZOVIRAX) 725 mg in dextrose 5 % 150 mL IVPB  10 mg/kg Intravenous Q8H Poindexter, Leann T, RPH 164.5 mL/hr at 03/07/17 0946 725 mg at 03/07/17 0946  . docusate sodium (COLACE) capsule 100 mg  100 mg Oral BID Karmen Bongo, MD   100 mg at 03/07/17 0945  . famotidine (PEPCID) tablet 20 mg  20 mg Oral Daily Opyd, Ilene Qua, MD   20 mg at 03/07/17 0945  . gabapentin (NEURONTIN) capsule 100 mg  100 mg Oral TID Karmen Bongo, MD   100 mg at 03/07/17 0946  . ketorolac (TORADOL) 30 MG/ML injection 30 mg  30 mg Intravenous Q6H PRN Opyd, Ilene Qua, MD      . nicotine (NICODERM CQ - dosed in mg/24 hr) patch 7 mg  7 mg Transdermal Daily Karmen Bongo, MD   7 mg at 03/07/17 0945  . ondansetron (ZOFRAN) tablet 4 mg  4 mg Oral Q6H PRN Karmen Bongo, MD       Or  . ondansetron [facility]) injection 4 mg  4 mg Intravenous Q6H PRN Karmen Bongo, MD      . traMADol Veatrice Bourbon) tablet 50 mg  50 mg Oral Q6H PRN Opyd, Ilene Qua, MD   50 mg at 03/06/17 2103  . traZODone (DESYREL) tablet 50 mg  50 mg Oral QHS PRN Karmen Bongo, MD   50 mg at 03/06/17 2103    Musculoskeletal: Strength & Muscle Tone: within normal  limits Gait & Station: normal Patient leans: N/A  Psychiatric Specialty Exam: Physical Exam  Review of Systems  HENT: Positive for congestion.   Respiratory: Negative.   Cardiovascular: Negative.   Musculoskeletal: Negative.   Skin:       shingles  Neurological:       Pain/shingles  Psychiatric/Behavioral: Positive for depression, substance abuse and suicidal ideas. The patient is nervous/anxious and has insomnia.     Blood pressure (!) 102/59, pulse Marland Kitchen)  54, temperature 97.8 F (36.6 C), temperature source Oral, resp. rate 16, height _0  (1.702 m), weight 70.6 kg (155 lb 10.3 oz), SpO2 99 %.Body mass index is 24.38 kg/m.  General Appearance: Casual and Fairly Groomed  Eye Contact:  Fair  Speech:  Clear and Coherent  Volume:  strained, in pain  Mood:  Depressed and Dysphoric  Affect:  Congruent  Thought Process:  Goal Directed  Orientation:  Full (Time, Place, and Person)  Thought Content:  Logical  Suicidal Thoughts:  Yes.  without intent/plan  Homicidal Thoughts:  No  Memory:  Immediate;   Fair  Judgement:  Fair  Insight:  Shallow  Psychomotor Activity:  Normal  Concentration:  Concentration: Fair  Recall:  NA  Fund of Knowledge:  Fair  Language:  Good  Akathisia:  Negative  Handed:  Right  AIMS (if indicated):     Assets:  Communication Skills Desire for Improvement  ADL's:  Intact  Cognition:  WNL  Sleep:       Treatment Plan Summary: Edwin Martinez is a 31 year old male with polysubstance use and multiple psychiatric diagnoses including depression, bipolar, thought disorder. Presently admitted with herpes zoster, on contact and airborne isolation.  Psych consulted for depression and SI.  He continues to report SI with plan, but reports that he is able to stay safe in hospital.  He is not upfront about his substance use and denies any recent drug or alcohol use.  I am suspicious of stimulant withdrawal contributing to his depressed state.  I would suggest  vigilance and monitoring for alcohol withdrawal as well.  I'd suggest to restart his home psychiatric medications and we can reassess the need for psychiatric hospitalization as his medical status improves and as he is further out from stimulant (cocaine, amphetamine) withdrawal.   - please maintain sitter at bedside - monitor for alcohol/gaba withdrawal - continue trazodone 50 mg qhs - restart Celexa 20 mg daily and Buspar 5 mg BID for anxiety and mood symptoms - Consider psychiatric dispo as his medical status improves and he is further out from stimulant withdrawal - Please consult SW to assist patient in considering substance treatment, specifically residential options  Disposition: Reassess the need for psychiatric admission as he is further away from stimulant withdrawal and medical status has improved  Aundra Dubin, MD 03/07/2017 11:08 AM

## 2017-03-07 NOTE — Progress Notes (Signed)
PROGRESS NOTE    TRISTAIN DAILY  ZOX:[id number] DOB: 30-Sep-1985 DOA: 03/06/2017 PCP: Judyann Munson, MD    Brief Narrative: Edwin Martinez is a 31 y.o. male with medical history significant of untreated HIV infection; polysubstance abuse; and bipolar with schizoaffective d/o presenting with "shingles and suicidal thoughts".  Today is the third day with rash - came in on 6/7 and was diagnosed with allergic reaction.  No itch, just pain. R upper chest and back, neck, shoulder.  No h/o shingles.  He has not been taking HIV medications for the last 2-3 months due to homelessness and transportation issues; he has an appointment in the ID clinic later this month.    He hears voices that tell him to jump in front of vehicles, to jump off buildings, and that people are out to get him.  Always scared, depressed.  +h/o multiple suicidal attempts - his last was an OD about 3-4 months ago, was hospitalized at [facility] about 5-6 days.  Usually after these hospitalizations it helps him, but not so this time due to previously mentioned homelessness/transportation issues.   Assessment & Plan:   Principal Problem:   Herpes zoster Active Problems:   Human immunodeficiency virus (HIV) disease ([facility])   Tobacco use disorder   Suicidal ideation   Polysubstance abuse   Homelessness   Herpes Zoster;  Airborne and contact precautions for now. Continue with Acyclovir Q 8 hours.  Gabapentin   Suicidal ideation;  Psych consulted.   HIV;  Viral load 231,000 on 12/17/16. CD4; pending.  Will consult ID.   Polysubstance abuse;  UDS; positive for cocaine, amphetamine, TCH.   Tobacco dependence  Homelessness -SW consult -Needs assistance and encouragement regarding medication compliance, physician follow-up    DVT prophylaxis: SCD, lovenox Code Status: full code.  Family Communication: patient  Disposition Plan: remain inpatient.   Consultants:   Psych    Procedures:    none   Antimicrobials:  Acyclovir.    Subjective: He is complaining of pain right shoulder , rash site   Objective: Vitals:   03/06/17 2205 03/06/17 2323 03/07/17 0206 03/07/17 0632  BP:    (!) 102/59  Pulse:    (!) 54  Resp:    16  Temp: (!) 102.2 F (39 C) (!) 100.6 F (38.1 C) 97.1 F (36.2 C) 97.8 F (36.6 C)  TempSrc: Oral Oral Oral Oral  SpO2:    99%  Weight:      Height:        Intake/Output Summary (Last 24 hours) at 03/07/17 0809 Last data filed at 03/06/17 2007  Gross per 24 hour  Intake              150 ml  Output                0 ml  Net              150 ml   Filed Weights   03/06/17 2059  Weight: 70.6 kg (155 lb 10.3 oz)    Examination:  General exam: Appears calm and comfortable  Respiratory system: Clear to auscultation. Respiratory effort normal. Cardiovascular system: S1 & S2 heard, RRR. No JVD, murmurs, rubs, gallops or clicks. No pedal edema. Gastrointestinal system: Abdomen is nondistended, soft and nontender. No organomegaly or masses felt. Normal bowel sounds heard. Central nervous system: Alert and oriented. No focal neurological deficits. Extremities: Symmetric 5 x 5 power. Skin: multiples blister, whitish, brown shoulder area, dermatomal distribution.  Psychiatry: Judgement and insight appear normal. Mood & affect appropriate.     Data Reviewed: I have personally reviewed following labs and imaging studies  CBC:  Recent Labs Lab 03/06/17 1707 03/07/17 0420  WBC 6.5 5.8  NEUTROABS 3.2  --   HGB 13.9 13.8  HCT 41.3 41.5  MCV 82.6 82.7  PLT 205 191   Basic Metabolic Panel:  Recent Labs Lab 03/06/17 1707 03/07/17 0420  NA 135 137  K 3.7 3.7  CL 99* 104  CO2 26 27  GLUCOSE 87 94  BUN 11 11  CREATININE 0.99 0.90  CALCIUM 8.9 8.6*   GFR: Estimated Creatinine Clearance: 111.2 mL/min (by C-G formula based on SCr of 0.9 mg/dL). Liver Function Tests:  Recent Labs Lab 03/06/17 1707  AST 26  ALT 20  ALKPHOS 61   BILITOT 0.5  PROT 7.9  ALBUMIN 4.1   No results for input(s): LIPASE, AMYLASE in the last 168 hours. No results for input(s): AMMONIA in the last 168 hours. Coagulation Profile: No results for input(s): INR, PROTIME in the last 168 hours. Cardiac Enzymes: No results for input(s): CKTOTAL, CKMB, CKMBINDEX, TROPONINI in the last 168 hours. BNP (last 3 results) No results for input(s): PROBNP in the last 8760 hours. HbA1C: No results for input(s): HGBA1C in the last 72 hours. CBG: No results for input(s): GLUCAP in the last 168 hours. Lipid Profile: No results for input(s): CHOL, HDL, LDLCALC, TRIG, CHOLHDL, LDLDIRECT in the last 72 hours. Thyroid Function Tests: No results for input(s): TSH, T4TOTAL, FREET4, T3FREE, THYROIDAB in the last 72 hours. Anemia Panel: No results for input(s): VITAMINB12, FOLATE, FERRITIN, TIBC, IRON, RETICCTPCT in the last 72 hours. Sepsis Labs: No results for input(s): PROCALCITON, LATICACIDVEN in the last 168 hours.  No results found for this or any previous visit (from the past 240 hour(s)).       Radiology Studies: Dg Chest 2 View  Result Date: 03/06/2017 CLINICAL DATA:  31 year old male with history of suicidal ideation. Widespread vesicular rash. Possible shingles. EXAM: CHEST  2 VIEW COMPARISON:  No priors. FINDINGS: Lung volumes are normal. No consolidative airspace disease. No pleural effusions. No pneumothorax. No pulmonary nodule or mass noted. Pulmonary vasculature and the cardiomediastinal silhouette are within normal limits. IMPRESSION: No radiographic evidence of acute cardiopulmonary disease. Electronically Signed   By: Trudie Reedaniel  Entrikin M.D.   On: 03/06/2017 17:30        Scheduled Meds: . docusate sodium  100 mg Oral BID  . famotidine  20 mg Oral Daily  . gabapentin  100 mg Oral TID  . nicotine  7 mg Transdermal Daily   Continuous Infusions: . acyclovir Stopped (03/07/17 0346)     LOS: 0 days    Time spent:35 minutes.      Alba Coryegalado, Belkys A, MD [facility] Pager [phone]  If 7PM-7AM, please contact night-coverage [email or web address] Password [email or web address] 03/07/2017, 8:09 AM

## 2017-03-07 NOTE — Consult Note (Signed)
[name] for Infectious Disease  Date of Admission:  03/06/2017  Date of Consult:  03/07/2017  Reason for Consult: Shingles, HIV+ Referring Physician: Ralene Bathe   Impression/Recommendation Shingles Continue isolation til crusted Watch for superinfection Continue acyclovir Watch kidney function  HIV+ Has f/u with [facility] and Dr Baxter Flattery Will let clinic know he will miss appt tomorrow HIV 1 RNA Quant (copies/mL)  Date Value  07/18/2016 100  09/11/2015 <20  02/05/2015 <20   CD4 T Cell Abs (/uL)  Date Value  12/17/2016 1,160  07/18/2016 2,020  09/11/2015 1,250  His Cd4 count was quite good, no need for OI rx at thtis point Will recheck his CD4, STI labs.  Genotype in March is Naive  Polysubs absue suicidual thoughts His UDS+ for cocaine, THC Psych eval ongoing.   Thank you so much for this interesting consult,   Edwin Martinez (pager) [phone] [email or web address]  Edwin Martinez is an 31 y.o. male.  HPI: 31 yo M with bipolar, SI, and HIV+ since 2008 who has been off ART. States he is scheduled to restart on 6-19.  He comes to ED on 6-8 with a rash that was suspected to be from Axe body spray.  He returned on 6-9 with pain and blistering on R neck and "suiclidal thoughts".  Not clear if he had fever at home or in hospital.    Past Medical History:  Diagnosis Date  . ADHD (attention deficit hyperactivity disorder) 09/12/2012  . Anxiety   . Asthma   . Bipolar 1 disorder ([place])   . HIV (human immunodeficiency virus infection) ([place])   . Hypertension   . Schizophrenia ([place])   . Seizures ([place])     Past Surgical History:  Procedure Laterality Date  . DENTAL SURGERY       Allergies  Allergen Reactions  . Magnesium-Containing Compounds Other (See Comments)    This medication is contraindicated with pts HIV meds.    . Peanut-Containing Drug Products Anaphylaxis  . Esomeprazole Magnesium Cough  . Atripla [Efavirenz-Emtricitab-Tenofovir]  Other (See Comments)    Reaction:  Suicidal thoughts   . Bactrim [Sulfamethoxazole-Trimethoprim] Rash  . Penicillins Rash and Other (See Comments)    Has patient had a PCN reaction causing immediate rash, facial/tongue/throat swelling, SOB or lightheadedness with hypotension: Yes Has patient had a PCN reaction causing severe rash involving mucus membranes or skin necrosis: No Has patient had a PCN reaction that required hospitalization No Has patient had a PCN reaction occurring within the last 10 years: No If all of the above answers are "NO", then may proceed with Cephalosporin use.    Medications:  Scheduled: . docusate sodium  100 mg Oral BID  . famotidine  20 mg Oral Daily  . gabapentin  100 mg Oral TID  . nicotine  7 mg Transdermal Daily    Abtx:  Anti-infectives    Start     Dose/Rate Route Frequency Ordered Stop   03/07/17 0200  acyclovir (ZOVIRAX) 725 mg in dextrose 5 % 150 mL IVPB     10 mg/kg  72.6 kg 164.5 mL/hr over 60 Minutes Intravenous Every 8 hours 03/06/17 2039     03/06/17 1730  acyclovir (ZOVIRAX) 725 mg in dextrose 5 % 150 mL IVPB     10 mg/kg  72.6 kg 164.5 mL/hr over 60 Minutes Intravenous  Once 03/06/17 1712 03/06/17 2007      Total days of antibiotics: 1 Acyclovir  Social History:  reports that he has been smoking Cigarettes.  He started smoking about 25 years ago. He has been smoking about 0.50 packs per day. He has never used smokeless tobacco. He reports that he drinks about 1.2 oz of alcohol per week . He reports that he uses drugs, including Cocaine and Marijuana.  Family History  Problem Relation Age of Onset  . Huntington's disease Father   . Heart disease Mother   . Suicidality Maternal Uncle   . Suicidality Maternal Grandmother     General ROS: no vision change, no dysphagia, no oral ulcers or thrush, no sob or cough, normal bm, normal urination. +fever. see HPI.   Blood pressure (!) 102/59, pulse (!) 54, temperature 97.8 F  (36.6 C), temperature source Oral, resp. rate 16, height _0  (1.702 m), weight 70.6 kg (155 lb 10.3 oz), SpO2 99 %. General appearance: alert, cooperative and no distress Eyes: negative findings: conjunctivae and sclerae normal and pupils equal, round, reactive to light and accomodation Throat: lips, mucosa, and tongue normal; teeth and gums normal Neck: no adenopathy and supple, symmetrical, trachea midline Lungs: clear to auscultation bilaterally Heart: regular rate and rhythm Abdomen: normal findings: bowel sounds normal and soft, non-tender Skin: vesicular rash on R neck, upper back and chest. no pustulence. no evidence of superinfection.    Results for orders placed or performed during the hospital encounter of 03/06/17 (from the past 48 hour(s))  Comprehensive metabolic panel     Status: Abnormal   Collection Time: 03/06/17  5:07 PM  Result Value Ref Range   Sodium 135 135 - 145 mmol/L   Potassium 3.7 3.5 - 5.1 mmol/L   Chloride 99 (L) 101 - 111 mmol/L   CO2 26 22 - 32 mmol/L   Glucose, Bld 87 65 - 99 mg/dL   BUN 11 6 - 20 mg/dL   Creatinine, Ser 0.99 0.61 - 1.24 mg/dL   Calcium 8.9 8.9 - 10.3 mg/dL   Total Protein 7.9 6.5 - 8.1 g/dL   Albumin 4.1 3.5 - 5.0 g/dL   AST 26 15 - 41 U/L   ALT 20 17 - 63 U/L   Alkaline Phosphatase 61 38 - 126 U/L   Total Bilirubin 0.5 0.3 - 1.2 mg/dL   GFR calc non Af Amer >60 >60 mL/min   GFR calc Af Amer >60 >60 mL/min    Comment: (NOTE) The eGFR has been calculated using the CKD EPI equation. This calculation has not been validated in all clinical situations. eGFR's persistently <60 mL/min signify possible Chronic Kidney Disease.    Anion gap 10 5 - 15  CBC with Differential     Status: None   Collection Time: 03/06/17  5:07 PM  Result Value Ref Range   WBC 6.5 4.0 - 10.5 K/uL   RBC 5.00 4.22 - 5.81 MIL/uL   Hemoglobin 13.9 13.0 - 17.0 g/dL   HCT 41.3 39.0 - 52.0 %   MCV 82.6 78.0 - 100.0 fL   MCH 27.8 26.0 - 34.0 pg   MCHC 33.7  30.0 - 36.0 g/dL   RDW 15.0 11.5 - 15.5 %   Platelets 205 150 - 400 K/uL   Neutrophils Relative % 49 %   Lymphocytes Relative 35 %   Monocytes Relative 13 %   Eosinophils Relative 1 %   Basophils Relative 2 %   Neutro Abs 3.2 1.7 - 7.7 K/uL   Lymphs Abs 2.3 0.7 - 4.0 K/uL   Monocytes Absolute 0.8  0.1 - 1.0 K/uL   Eosinophils Absolute 0.1 0.0 - 0.7 K/uL   Basophils Absolute 0.1 0.0 - 0.1 K/uL   WBC Morphology ATYPICAL LYMPHOCYTES   Urinalysis, Routine w reflex microscopic     Status: Abnormal   Collection Time: 03/06/17  5:07 PM  Result Value Ref Range   Color, Urine YELLOW YELLOW   APPearance CLEAR CLEAR   Specific Gravity, Urine 1.021 1.005 - 1.030   pH 5.0 5.0 - 8.0   Glucose, UA 50 (A) NEGATIVE mg/dL   Hgb urine dipstick NEGATIVE NEGATIVE   Bilirubin Urine NEGATIVE NEGATIVE   Ketones, ur NEGATIVE NEGATIVE mg/dL   Protein, ur NEGATIVE NEGATIVE mg/dL   Nitrite NEGATIVE NEGATIVE   Leukocytes, UA NEGATIVE NEGATIVE  Urine rapid drug screen (hosp performed)     Status: Abnormal   Collection Time: 03/06/17  5:07 PM  Result Value Ref Range   Opiates NONE DETECTED NONE DETECTED   Cocaine POSITIVE (A) NONE DETECTED   Benzodiazepines NONE DETECTED NONE DETECTED   Amphetamines POSITIVE (A) NONE DETECTED   Tetrahydrocannabinol POSITIVE (A) NONE DETECTED   Barbiturates NONE DETECTED NONE DETECTED    Comment:        DRUG SCREEN FOR MEDICAL PURPOSES ONLY.  IF CONFIRMATION IS NEEDED FOR ANY PURPOSE, NOTIFY LAB WITHIN 5 DAYS.        LOWEST DETECTABLE LIMITS FOR URINE DRUG SCREEN Drug Class       Cutoff (ng/mL) Amphetamine      1000 Barbiturate      200 Benzodiazepine   741 Tricyclics       287 Opiates          300 Cocaine          300 THC              50   Ethanol     Status: None   Collection Time: 03/06/17  5:07 PM  Result Value Ref Range   Alcohol, Ethyl (B) <5 <5 mg/dL    Comment:        LOWEST DETECTABLE LIMIT FOR SERUM ALCOHOL IS 5 mg/dL FOR MEDICAL PURPOSES ONLY    Basic metabolic panel     Status: Abnormal   Collection Time: 03/07/17  4:20 AM  Result Value Ref Range   Sodium 137 135 - 145 mmol/L   Potassium 3.7 3.5 - 5.1 mmol/L   Chloride 104 101 - 111 mmol/L   CO2 27 22 - 32 mmol/L   Glucose, Bld 94 65 - 99 mg/dL   BUN 11 6 - 20 mg/dL   Creatinine, Ser 0.90 0.61 - 1.24 mg/dL   Calcium 8.6 (L) 8.9 - 10.3 mg/dL   GFR calc non Af Amer >60 >60 mL/min   GFR calc Af Amer >60 >60 mL/min    Comment: (NOTE) The eGFR has been calculated using the CKD EPI equation. This calculation has not been validated in all clinical situations. eGFR's persistently <60 mL/min signify possible Chronic Kidney Disease.    Anion gap 6 5 - 15  CBC     Status: None   Collection Time: 03/07/17  4:20 AM  Result Value Ref Range   WBC 5.8 4.0 - 10.5 K/uL   RBC 5.02 4.22 - 5.81 MIL/uL   Hemoglobin 13.8 13.0 - 17.0 g/dL   HCT 41.5 39.0 - 52.0 %   MCV 82.7 78.0 - 100.0 fL   MCH 27.5 26.0 - 34.0 pg   MCHC 33.3 30.0 - 36.0 g/dL   RDW  14.9 11.5 - 15.5 %   Platelets 191 150 - 400 K/uL   No results found for: SDES, SPECREQUEST, CULT, REPTSTATUS Dg Chest 2 View  Result Date: 03/06/2017 CLINICAL DATA:  31 year old male with history of suicidal ideation. Widespread vesicular rash. Possible shingles. EXAM: CHEST  2 VIEW COMPARISON:  No priors. FINDINGS: Lung volumes are normal. No consolidative airspace disease. No pleural effusions. No pneumothorax. No pulmonary nodule or mass noted. Pulmonary vasculature and the cardiomediastinal silhouette are within normal limits. IMPRESSION: No radiographic evidence of acute cardiopulmonary disease. Electronically Signed   By: Vinnie Langton M.D.   On: 03/06/2017 17:30   No results found for this or any previous visit (from the past 240 hour(s)).    03/07/2017, 10:55 AM     LOS: 0 days    Records and images were personally reviewed where available.

## 2017-03-08 ENCOUNTER — Ambulatory Visit: Payer: Self-pay | Admitting: Internal Medicine

## 2017-03-08 DIAGNOSIS — R509 Fever, unspecified: Secondary | ICD-10-CM | POA: Diagnosis present

## 2017-03-08 DIAGNOSIS — Z882 Allergy status to sulfonamides status: Secondary | ICD-10-CM

## 2017-03-08 DIAGNOSIS — R45851 Suicidal ideations: Secondary | ICD-10-CM | POA: Insufficient documentation

## 2017-03-08 LAB — BASIC METABOLIC PANEL
ANION GAP: 6 (ref 5–15)
BUN: 10 mg/dL (ref 6–20)
CHLORIDE: 103 mmol/L (ref 101–111)
CO2: 29 mmol/L (ref 22–32)
CREATININE: 0.92 mg/dL (ref 0.61–1.24)
Calcium: 8.9 mg/dL (ref 8.9–10.3)
GFR calc non Af Amer: >60 mL/min (ref >60)
Glucose, Bld: 81 mg/dL (ref 65–99)
Potassium: 3.7 mmol/L (ref 3.5–5.1)
Sodium: 138 mmol/L (ref 135–145)

## 2017-03-08 LAB — RPR: RPR: NONREACTIVE

## 2017-03-08 LAB — GC/CHLAMYDIA PROBE AMP (~~LOC~~) NOT AT ARMC
Chlamydia: NEGATIVE
NEISSERIA GONORRHEA: NEGATIVE

## 2017-03-08 LAB — T-HELPER CELLS (CD4) COUNT (NOT AT ARMC)
CD4 T CELL ABS: 650 /uL (ref 400–2700)
CD4 T CELL HELPER: 30 % — AB (ref 33–55)

## 2017-03-08 MED ORDER — VALACYCLOVIR HCL 500 MG PO TABS
1000.0000 mg | ORAL_TABLET | Freq: Three times a day (TID) | ORAL | Status: DC
  Administered 2017-03-08 – 2017-03-12 (×14): 1000 mg via ORAL
  Filled 2017-03-08 (×16): qty 2

## 2017-03-08 MED ORDER — VITAMIN B-1 100 MG PO TABS
100.0000 mg | ORAL_TABLET | Freq: Every day | ORAL | Status: DC
  Administered 2017-03-09 – 2017-03-12 (×4): 100 mg via ORAL
  Filled 2017-03-08 (×4): qty 1

## 2017-03-08 MED ORDER — BUSPIRONE HCL 5 MG PO TABS
5.0000 mg | ORAL_TABLET | Freq: Two times a day (BID) | ORAL | Status: DC
Start: 2017-03-08 — End: 2017-03-12
  Administered 2017-03-08 – 2017-03-12 (×9): 5 mg via ORAL
  Filled 2017-03-08 (×9): qty 1

## 2017-03-08 MED ORDER — SODIUM CHLORIDE 0.9 % IV SOLN
INTRAVENOUS | Status: DC
  Administered 2017-03-09 (×2): via INTRAVENOUS
  Administered 2017-03-10 (×2): 1000 mL via INTRAVENOUS

## 2017-03-08 MED ORDER — CITALOPRAM HYDROBROMIDE 20 MG PO TABS
20.0000 mg | ORAL_TABLET | Freq: Every day | ORAL | Status: DC
  Administered 2017-03-08 – 2017-03-12 (×5): 20 mg via ORAL
  Filled 2017-03-08 (×5): qty 1

## 2017-03-08 MED ORDER — CLINDAMYCIN HCL 300 MG PO CAPS
300.0000 mg | ORAL_CAPSULE | Freq: Three times a day (TID) | ORAL | Status: DC
  Administered 2017-03-08 – 2017-03-12 (×13): 300 mg via ORAL
  Filled 2017-03-08 (×15): qty 1

## 2017-03-08 NOTE — Progress Notes (Addendum)
Patient ID: Edwin Martinez, male   DOB: April 25, 1986, 31 y.o.   MRN: [id number]          [facility] for Infectious Disease  Date of Admission:  03/06/2017           Day 3 acyclovir  Principal Problem:   Herpes zoster Active Problems:   Fever   Human immunodeficiency virus (HIV) disease (HCC)   Suicidal ideation   Schizoaffective disorder, bipolar type (HCC)   Tobacco use disorder   Polysubstance abuse   Homelessness   . busPIRone  5 mg Oral BID  . citalopram  20 mg Oral Daily  . docusate sodium  100 mg Oral BID  . enoxaparin (LOVENOX) injection  40 mg Subcutaneous Q24H  . famotidine  20 mg Oral Daily  . folic acid  1 mg Oral Daily  . gabapentin  100 mg Oral TID  . nicotine  7 mg Transdermal Daily  . [START ON 03/09/2017] thiamine  100 mg Oral Daily    SUBJECTIVE: He is still having moderate pain from his right-sided shingles but otherwise feels like he is doing a little better. He has been homeless recently and sleeping [place] [address] [place]. He has been out of his Triumeq for the past month. He was getting it with the assistance of ADAP but he has not been able to get back to clinic recently and recertify his ADAP. He did contact our Paramedic, Dwain Sarna, last week and has an appointment to see my partner, Dr. [name] on 03/16/2017. He is eager to get started back on Triumeq.  Review of Systems: Review of Systems  Constitutional: Positive for chills and fever. Negative for diaphoresis, malaise/fatigue and weight loss.  HENT: Negative for sore throat.   Respiratory: Positive for cough. Negative for sputum production and shortness of breath.        Recent, mild dry cough.  Cardiovascular: Negative for chest pain.  Gastrointestinal: Negative for abdominal pain, diarrhea, heartburn, nausea and vomiting.  Genitourinary: Negative for dysuria and frequency.  Musculoskeletal: Negative for joint pain and myalgias.  Skin: Positive for rash.  Neurological: Negative for  dizziness and headaches.  Psychiatric/Behavioral: Positive for depression, hallucinations, substance abuse and suicidal ideas. The patient is not nervous/anxious.     Past Medical History:  Diagnosis Date  . ADHD (attention deficit hyperactivity disorder) 09/12/2012  . Anxiety   . Asthma   . Bipolar 1 disorder (HCC)   . HIV (human immunodeficiency virus infection) (HCC)   . Hypertension   . Schizophrenia (HCC)   . Seizures (H[facility])     Social History  Substance Use Topics  . Smoking status: Current Every Day Smoker    Packs/day: 0.50    Types: Cigarettes    Start date: 09/29/1991  . Smokeless tobacco: Never Used  . Alcohol use 1.2 oz/week    2 Standard drinks or equivalent per week     Comment: little recently    Family History  Problem Relation Age of Onset  . Huntington's disease Father   . Heart disease Mother   . Suicidality Maternal Uncle   . Suicidality Maternal Grandmother    Allergies  Allergen Reactions  . Magnesium-Containing Compounds Other (See Comments)    This medication is contraindicated with pts HIV meds.    . Peanut-Containing Drug Products Anaphylaxis  . Esomeprazole Magnesium Cough  . Atripla [Efavirenz-Emtricitab-Tenofovir] Other (See Comments)    Reaction:  Suicidal thoughts   . Bactrim [Sulfamethoxazole-Trimethoprim] Rash  . Penicillins Rash  and Other (See Comments)    Has patient had a PCN reaction causing immediate rash, facial/tongue/throat swelling, SOB or lightheadedness with hypotension: Yes Has patient had a PCN reaction causing severe rash involving mucus membranes or skin necrosis: No Has patient had a PCN reaction that required hospitalization No Has patient had a PCN reaction occurring within the last 10 years: No If all of the above answers are "NO", then may proceed with Cephalosporin use.    OBJECTIVE: Vitals:   03/07/17 1947 03/07/17 2200 03/07/17 2309 03/08/17 0618  BP: 130/71   113/63  Pulse: 89   (!) 56  Resp: 18   16    Temp: (!) 103.2 F (39.6 C) 100 F (37.8 C) 99.9 F (37.7 C) 99.7 F (37.6 C)  TempSrc: Oral  Oral Oral  SpO2: 98%   100%  Weight:      Height:       Body mass index is 24.38 kg/m.  Physical Exam  Constitutional: He is oriented to person, place, and time.  He is resting quietly in bed.  HENT:  Mouth/Throat: No oropharyngeal exudate.  Eyes: Conjunctivae are normal.  Neck: Neck supple.  Cardiovascular: Normal rate and regular rhythm.   No murmur heard. Pulmonary/Chest: Effort normal and breath sounds normal.  Abdominal: Soft. He exhibits no mass. There is no tenderness.  Musculoskeletal: Normal range of motion. He exhibits no edema or tenderness.  Neurological: He is alert and oriented to person, place, and time.  Skin: Rash noted.  He has numerous vesicles and pustules on his right upper trunk and shoulder.  Psychiatric: Mood and affect normal.    Lab Results Lab Results  Component Value Date   WBC 5.8 03/07/2017   HGB 13.8 03/07/2017   HCT 41.5 03/07/2017   MCV 82.7 03/07/2017   PLT 191 03/07/2017    Lab Results  Component Value Date   CREATININE 0.90 03/07/2017   BUN 11 03/07/2017   NA 137 03/07/2017   K 3.7 03/07/2017   CL 104 03/07/2017   CO2 27 03/07/2017    Lab Results  Component Value Date   ALT 20 03/06/2017   AST 26 03/06/2017   ALKPHOS 61 03/06/2017   BILITOT 0.5 03/06/2017    HIV 1 RNA Quant (copies/mL)  Date Value  07/18/2016 100  09/11/2015 <20  02/05/2015 <20   CD4 T Cell Abs (/uL)  Date Value  03/07/2017 650  12/17/2016 1,160  07/18/2016 2,020    Microbiology: No results found for this or any previous visit (from the past 240 hour(s)).   ASSESSMENT: He may have secondary bacterial infection of his shingles given the numerous pustules. I will start oral Clindamycin (he has a history of penicillin allergy and no previous known cephalosporin tolerance). I will change his IV acyclovir to oral valacyclovir. With a CD4 count of 650  he is not at risk for HIV related opportunistic infections.  PLAN: 1. Change acyclovir to valacyclovir 2. Start cephalexin  Cliffton AstersJohn Jes Costales, MD [facility] [phone] pager   [phone] cell 03/08/2017, 11:34 AM

## 2017-03-08 NOTE — Progress Notes (Addendum)
PROGRESS NOTE    Edwin Clossdward A Dauzat  ZOX:[id number] DOB: 1986/07/14 DOA: 03/06/2017 PCP: Judyann MunsonSnider, Cynthia, MD    Brief Narrative: Edwin Martinez is a 31 y.o. male with medical history significant of untreated HIV infection; polysubstance abuse; and bipolar with schizoaffective d/o presenting with "shingles and suicidal thoughts".  Today is the third day with rash - came in on 6/7 and was diagnosed with allergic reaction.  No itch, just pain. R upper chest and back, neck, shoulder.  No h/o shingles.  He has not been taking HIV medications for the last 2-3 months due to homelessness and transportation issues; he has an appointment in the ID clinic later this month.    He hears voices that tell him to jump in front of vehicles, to jump off buildings, and that people are out to get him.  Always scared, depressed.  +h/o multiple suicidal attempts - his last was an OD about 3-4 months ago, [facility]  Usually after these hospitalizations it helps him, but not so this time due to previously mentioned homelessness/transportation issues.   Assessment & Plan:   Principal Problem:   Herpes zoster Active Problems:   Human immunodeficiency virus (HIV) disease (HCC)   Tobacco use disorder   Suicidal ideation   Polysubstance abuse   Homelessness   Suicidal ideations   Herpes Zoster;  Airborne and contact precautions until blister crusted.  Continue with Acyclovir Q 8 hours.  Gabapentin TID.  Check renal function.   Suicidal ideation;  Psych consulted.  Started Buspar, celexa as recommended by psych [facility]  Continue with sitter.   HIV;  Viral load 231,000 on 12/17/16. CD4; 650 Appreciate Dr Ninetta LightsHatcher help.  ID following.   Polysubstance abuse;  UDS; positive for cocaine, amphetamine, TCH.   Tobacco dependence  Homelessness -SW consult -Needs assistance and encouragement regarding medication compliance,  physician follow-up    DVT prophylaxis: SCD, lovenox Code Status: full code.  Family Communication: patient  Disposition Plan: remain inpatient.   Consultants:   Psych    Procedures:   none   Antimicrobials:  Acyclovir.    Subjective: He is eating breakfast. Pain is controlled. No new complaints.   Objective: Vitals:   03/07/17 1947 03/07/17 2200 03/07/17 2309 03/08/17 0618  BP: 130/71   113/63  Pulse: 89   (!) 56  Resp: 18   16  Temp: (!) 103.2 F (39.6 C) 100 F (37.8 C) 99.9 F (37.7 C) 99.7 F (37.6 C)  TempSrc: Oral  Oral Oral  SpO2: 98%   100%  Weight:      Height:        Intake/Output Summary (Last 24 hours) at 03/08/17 1037 Last data filed at 03/08/17 0955  Gross per 24 hour  Intake              830 ml  Output              200 ml  Net              630 ml   Filed Weights   03/06/17 2059  Weight: 70.6 kg (155 lb 10.3 oz)    Examination:  General exam: NAD Respiratory system:N ormal respiratory effort, CTA Cardiovascular system: S 1, S 2 RRR Gastrointestinal system: Bs present, soft, nt Central nervous system: non focal.  Extremities: Symmetric 5 x 5 power. Skin: multiples blister, whitish, brown shoulder area, dermatomal distribution.  Data Reviewed: I have personally reviewed following labs and imaging studies  CBC:  Recent Labs Lab 03/06/17 1707 03/07/17 0420  WBC 6.5 5.8  NEUTROABS 3.2  --   HGB 13.9 13.8  HCT 41.3 41.5  MCV 82.6 82.7  PLT 205 191   Basic Metabolic Panel:  Recent Labs Lab 03/06/17 1707 03/07/17 0420  NA 135 137  K 3.7 3.7  CL 99* 104  CO2 26 27  GLUCOSE 87 94  BUN 11 11  CREATININE 0.99 0.90  CALCIUM 8.9 8.6*   GFR: Estimated Creatinine Clearance: 111.2 mL/min (by C-G formula based on SCr of 0.9 mg/dL). Liver Function Tests:  Recent Labs Lab 03/06/17 1707  AST 26  ALT 20  ALKPHOS 61  BILITOT 0.5  PROT 7.9  ALBUMIN 4.1   No results for input(s): LIPASE, AMYLASE in the last 168  hours. No results for input(s): AMMONIA in the last 168 hours. Coagulation Profile: No results for input(s): INR, PROTIME in the last 168 hours. Cardiac Enzymes: No results for input(s): CKTOTAL, CKMB, CKMBINDEX, TROPONINI in the last 168 hours. BNP (last 3 results) No results for input(s): PROBNP in the last 8760 hours. HbA1C: No results for input(s): HGBA1C in the last 72 hours. CBG: No results for input(s): GLUCAP in the last 168 hours. Lipid Profile: No results for input(s): CHOL, HDL, LDLCALC, TRIG, CHOLHDL, LDLDIRECT in the last 72 hours. Thyroid Function Tests: No results for input(s): TSH, T4TOTAL, FREET4, T3FREE, THYROIDAB in the last 72 hours. Anemia Panel: No results for input(s): VITAMINB12, FOLATE, FERRITIN, TIBC, IRON, RETICCTPCT in the last 72 hours. Sepsis Labs: No results for input(s): PROCALCITON, LATICACIDVEN in the last 168 hours.  No results found for this or any previous visit (from the past 240 hour(s)).       Radiology Studies: Dg Chest 2 View  Result Date: 03/06/2017 CLINICAL DATA:  31 year old male with history of suicidal ideation. Widespread vesicular rash. Possible shingles. EXAM: CHEST  2 VIEW COMPARISON:  No priors. FINDINGS: Lung volumes are normal. No consolidative airspace disease. No pleural effusions. No pneumothorax. No pulmonary nodule or mass noted. Pulmonary vasculature and the cardiomediastinal silhouette are within normal limits. IMPRESSION: No radiographic evidence of acute cardiopulmonary disease. Electronically Signed   By: Trudie Reed M.D.   On: 03/06/2017 17:30        Scheduled Meds: . busPIRone  5 mg Oral BID  . citalopram  20 mg Oral Daily  . docusate sodium  100 mg Oral BID  . enoxaparin (LOVENOX) injection  40 mg Subcutaneous Q24H  . famotidine  20 mg Oral Daily  . folic acid  1 mg Oral Daily  . gabapentin  100 mg Oral TID  . nicotine  7 mg Transdermal Daily  . thiamine  100 mg Oral Daily   Or  . thiamine  100 mg  Intravenous Daily   Continuous Infusions: . acyclovir 725 mg (03/08/17 1006)     LOS: 0 days    Time spent:35 minutes.     Alba Cory, MD [facility] Pager [phone]  If 7PM-7AM, please contact night-coverage [email or web address] Password [facility] 03/08/2017, 10:37 AM

## 2017-03-09 DIAGNOSIS — B028 Zoster with other complications: Secondary | ICD-10-CM

## 2017-03-09 MED ORDER — ABACAVIR-DOLUTEGRAVIR-LAMIVUD 600-50-300 MG PO TABS
1.0000 | ORAL_TABLET | Freq: Every day | ORAL | Status: DC
  Administered 2017-03-09 – 2017-03-12 (×4): 1 via ORAL
  Filled 2017-03-09 (×4): qty 1

## 2017-03-09 NOTE — Progress Notes (Signed)
Patient verbalized that he hears voices saying "killing herself" and better off dead". Emotional support given by this nurse. Calm at this time,cooperative.

## 2017-03-09 NOTE — Progress Notes (Signed)
Patient ID: Edwin Martinez, male   DOB: 08/06/1986, 31 y.o.   MRN: [id number]          [facility]  Date of Admission:  03/06/2017           Day 4 shingles therapy        Day 2 clindamycin  Principal Problem:   Herpes zoster Active Problems:   Fever   Human immunodeficiency virus (HIV) disease (HCC)   Suicidal ideation   Schizoaffective disorder, bipolar type (HCC)   Tobacco use disorder   Polysubstance abuse   Homelessness   . busPIRone  5 mg Oral BID  . citalopram  20 mg Oral Daily  . clindamycin  300 mg Oral Q8H  . docusate sodium  100 mg Oral BID  . enoxaparin (LOVENOX) injection  40 mg Subcutaneous Q24H  . famotidine  20 mg Oral Daily  . folic acid  1 mg Oral Daily  . gabapentin  100 mg Oral TID  . nicotine  7 mg Transdermal Daily  . thiamine  100 mg Oral Daily  . valACYclovir  1,000 mg Oral TID    SUBJECTIVE: Edwin Martinez is still having moderate pain related to Edwin Martinez right upper trunk shingles.  Review of Systems: Review of Systems  Constitutional: Negative for chills, diaphoresis, fever, malaise/fatigue and weight loss.  HENT: Negative for sore throat.   Respiratory: Negative for cough, sputum production and shortness of breath.        Recent, mild dry cough.  Cardiovascular: Negative for chest pain.  Gastrointestinal: Negative for abdominal pain, diarrhea, heartburn, nausea and vomiting.  Genitourinary: Negative for dysuria and frequency.  Musculoskeletal: Negative for joint pain and myalgias.  Skin: Positive for rash.  Neurological: Negative for dizziness and headaches.  Psychiatric/Behavioral: Positive for depression, hallucinations, substance abuse and suicidal ideas. The patient is not nervous/anxious.     Past Medical History:  Diagnosis Date  . ADHD (attention deficit hyperactivity disorder) 09/12/2012  . Anxiety   . Asthma   . Bipolar 1 disorder (HCC)   . HIV (human immunodeficiency virus infection) (HCC)   . Hypertension     . Schizophrenia (HCC)   . Seizures (HCC)     Social History  Substance Use Topics  . Smoking status: Current Every Day Smoker    Packs/day: 0.50    Types: Cigarettes    Start date: 09/29/1991  . Smokeless tobacco: Never Used  . Alcohol use 1.2 oz/week    2 Standard drinks or equivalent per week     Comment: little recently    Family History  Problem Relation Age of Onset  . Huntington's disease Father   . Heart disease Mother   . Suicidality Maternal Uncle   . Suicidality Maternal Grandmother    Allergies  Allergen Reactions  . Magnesium-Containing Compounds Other (See Comments)    This medication is contraindicated with pts HIV meds.    . Peanut-Containing Drug Products Anaphylaxis  . Esomeprazole Magnesium Cough  . Atripla [Efavirenz-Emtricitab-Tenofovir] Other (See Comments)    Reaction:  Suicidal thoughts   . Bactrim [Sulfamethoxazole-Trimethoprim] Rash  . Penicillins Rash and Other (See Comments)    Has patient had a PCN reaction causing immediate rash, facial/tongue/throat swelling, SOB or lightheadedness with hypotension: Yes Has patient had a PCN reaction causing severe rash involving mucus membranes or skin necrosis: No Has patient had a PCN reaction that required hospitalization No Has patient had a PCN reaction occurring within the last 10 years: No If  all of the above answers are "NO", then may proceed with Cephalosporin use.    OBJECTIVE: Vitals:   03/08/17 1915 03/08/17 2103 03/09/17 0602 03/09/17 1400  BP:  125/63 121/64 (!) 118/48  Pulse:  65 (!) 59 (!) 59  Resp:  16 16 16   Temp: 98.8 F (37.1 C) 98.6 F (37 C) 98.7 F (37.1 C) 97.8 F (36.6 C)  TempSrc: Oral Oral Oral Oral  SpO2:  97% 99% 99%  Weight:      Height:       Body mass index is 24.38 kg/m.  Physical Exam  Constitutional: Edwin Martinez is oriented to person, place, and time.  Edwin Martinez is resting quietly in bed.  HENT:  Mouth/Throat: No oropharyngeal exudate.  Eyes: Conjunctivae are normal.   Neck: Neck supple.  Cardiovascular: Normal rate and regular rhythm.   No murmur heard. Pulmonary/Chest: Effort normal and breath sounds normal.  Abdominal: Soft. There is no tenderness.  Musculoskeletal: Normal range of motion. Edwin Martinez exhibits no edema or tenderness.  Neurological: Edwin Martinez is alert and oriented to person, place, and time.  Skin: Rash noted.  Edwin Martinez vesicular and pustular rash is beginning to improve. There are no new lesions and some of the old bleed and are starting to crust over.  Psychiatric: Mood and affect normal.    Lab Results Lab Results  Component Value Date   WBC 5.8 03/07/2017   HGB 13.8 03/07/2017   HCT 41.5 03/07/2017   MCV 82.7 03/07/2017   PLT 191 03/07/2017    Lab Results  Component Value Date   CREATININE 0.92 03/08/2017   BUN 10 03/08/2017   NA 138 03/08/2017   K 3.7 03/08/2017   CL 103 03/08/2017   CO2 29 03/08/2017    Lab Results  Component Value Date   ALT 20 03/06/2017   AST 26 03/06/2017   ALKPHOS 61 03/06/2017   BILITOT 0.5 03/06/2017    HIV 1 RNA Quant (copies/mL)  Date Value  07/18/2016 100  09/11/2015 <20  02/05/2015 <20   CD4 T Cell Abs (/uL)  Date Value  03/07/2017 650  12/17/2016 1,160  07/18/2016 2,020    Microbiology: Recent Results (from the past 240 hour(s))  Culture, blood (routine x 2)     Status: None (Preliminary result)   Collection Time: 03/07/17  4:20 AM  Result Value Ref Range Status   Specimen Description BLOOD LEFT ARM  Final   Special Requests BOTTLES DRAWN AEROBIC AND ANAEROBIC BCAV  Final   Culture   Final    NO GROWTH 2 DAYS Performed at [facility], [address] [place], [place] [address]    Report Status PENDING  Incomplete  Culture, blood (routine x 2)     Status: None (Preliminary result)   Collection Time: 03/07/17  4:20 AM  Result Value Ref Range Status   Specimen Description BLOOD LEFT ARM  Final   Special Requests BOTTLES DRAWN AEROBIC AND ANAEROBIC BCAV  Final   Culture   Final     NO GROWTH 2 DAYS Performed at [facility], [address] [place], [place] [address]    Report Status PENDING  Incomplete     ASSESSMENT: Edwin Martinez is improving on therapy for shingles and probable secondary bacterial infection.  PLAN: 1. Continue valacyclovir and clindamycin 2. Restart Triumeq  Cliffton AstersJohn Chrisanne Loose, MD [facility] [phone] pager   [phone] cell 03/09/2017, 3:16 PM

## 2017-03-09 NOTE — Progress Notes (Signed)
PROGRESS NOTE    Edwin Clossdward A Woolf  ZOX:[id number] DOB: 23-Aug-1986 DOA: 03/06/2017 PCP: Judyann MunsonSnider, Cynthia, MD    Brief Narrative: Edwin Martinez is a 31 y.o. male with medical history significant of untreated HIV infection; polysubstance abuse; and bipolar with schizoaffective d/o presenting with "shingles and suicidal thoughts".  Today is the third day with rash - came in on 6/7 and was diagnosed with allergic reaction.  No itch, just pain. R upper chest and back, neck, shoulder.  No h/o shingles.  He has not been taking HIV medications for the last 2-3 months due to homelessness and transportation issues; he has an appointment in the ID clinic later this month.    He hears voices that tell him to jump in front of vehicles, to jump off buildings, and that people are out to get him.  Always scared, depressed.  +h/o multiple suicidal attempts - his last was an OD about 3-4 months ago, was [facility] days.  Usually after these hospitalizations it helps him, but not so this time due to previously mentioned homelessness/transportation issues.   Assessment & Plan:   Principal Problem:   Herpes zoster Active Problems:   Human immunodeficiency virus (HIV) disease (HCC)   Tobacco use disorder   Suicidal ideation   Schizoaffective disorder, bipolar type (HCC)   Polysubstance abuse   Homelessness   Fever   Herpes Zoster; and superimpose Bacterial infection.  Airborne and contact precautions until blister crusted.  Treated initially with Acyclovir Q 8 hours, change to oral Valtrex 6-11. Gabapentin TID.  Renal function stable. Now off acyclovir.  started on clindamycin to cover for superimpose infection.   Suicidal ideation;  Psych consulted.  Started Buspar, celexa as recommended by psych [facility].  Continue with sitter.  Still suicidal.   HIV;  Viral load 231,000 on 12/17/16. CD4; 650 Appreciate Dr Orvan Falconerampbell help.    ID following.   Polysubstance abuse;  UDS; positive for cocaine, amphetamine, TCH.   Tobacco dependence  Homelessness -SW consult -Needs assistance and encouragement regarding medication compliance, physician follow-up    DVT prophylaxis: SCD, lovenox Code Status: full code.  Family Communication: patient  Disposition Plan: remain inpatient.   Consultants:   Psych    Procedures:   none   Antimicrobials:  Acyclovir.    Subjective: Report shoulder, neck pain. Tramadol helps with pain.  Still with suicidal thought   Objective: Vitals:   03/08/17 1435 03/08/17 1915 03/08/17 2103 03/09/17 0602  BP: 122/81  125/63 121/64  Pulse: 73  65 (!) 59  Resp: 17  16 16   Temp: 97.7 F (36.5 C) 98.8 F (37.1 C) 98.6 F (37 C) 98.7 F (37.1 C)  TempSrc: Oral Oral Oral Oral  SpO2: 100%  97% 99%  Weight:      Height:        Intake/Output Summary (Last 24 hours) at 03/09/17 1325 Last data filed at 03/09/17 0700  Gross per 24 hour  Intake          2506.25 ml  Output                0 ml  Net          2506.25 ml   Filed Weights   03/06/17 2059  Weight: 70.6 kg (155 lb 10.3 oz)    Examination:  General exam: sitting in bed, no acute distress.  Respiratory system: normal respiratory effort, no ronchus or wheezing  Cardiovascular system: S, 1 , S 2 RRR Gastrointestinal system: BS present, soft, nt Central nervous system: non focal.  Extremities: Symmetric 5 x 5 power. Skin: Multiples blister, pustule.  some crusting area in neck, shoulder.      Data Reviewed: I have personally reviewed following labs and imaging studies  CBC:  Recent Labs Lab 03/06/17 1707 03/07/17 0420  WBC 6.5 5.8  NEUTROABS 3.2  --   HGB 13.9 13.8  HCT 41.3 41.5  MCV 82.6 82.7  PLT 205 191   Basic Metabolic Panel:  Recent Labs Lab 03/06/17 1707 03/07/17 0420 03/08/17 1117  NA 135 137 138  K 3.7 3.7 3.7  CL 99* 104 103  CO2 26 27 29   GLUCOSE 87 94 81  BUN 11 11 10    CREATININE 0.99 0.90 0.92  CALCIUM 8.9 8.6* 8.9   GFR: Estimated Creatinine Clearance: 108.8 mL/min (by C-G formula based on SCr of 0.92 mg/dL). Liver Function Tests:  Recent Labs Lab 03/06/17 1707  AST 26  ALT 20  ALKPHOS 61  BILITOT 0.5  PROT 7.9  ALBUMIN 4.1   No results for input(s): LIPASE, AMYLASE in the last 168 hours. No results for input(s): AMMONIA in the last 168 hours. Coagulation Profile: No results for input(s): INR, PROTIME in the last 168 hours. Cardiac Enzymes: No results for input(s): CKTOTAL, CKMB, CKMBINDEX, TROPONINI in the last 168 hours. BNP (last 3 results) No results for input(s): PROBNP in the last 8760 hours. HbA1C: No results for input(s): HGBA1C in the last 72 hours. CBG: No results for input(s): GLUCAP in the last 168 hours. Lipid Profile: No results for input(s): CHOL, HDL, LDLCALC, TRIG, CHOLHDL, LDLDIRECT in the last 72 hours. Thyroid Function Tests: No results for input(s): TSH, T4TOTAL, FREET4, T3FREE, THYROIDAB in the last 72 hours. Anemia Panel: No results for input(s): VITAMINB12, FOLATE, FERRITIN, TIBC, IRON, RETICCTPCT in the last 72 hours. Sepsis Labs: No results for input(s): PROCALCITON, LATICACIDVEN in the last 168 hours.  Recent Results (from the past 240 hour(s))  Culture, blood (routine x 2)     Status: None (Preliminary result)   Collection Time: 03/07/17  4:20 AM  Result Value Ref Range Status   Specimen Description BLOOD LEFT ARM  Final   Special Requests BOTTLES DRAWN AEROBIC AND ANAEROBIC BCAV  Final   Culture   Final    NO GROWTH 1 DAY Performed at [facility], [address], [place], [place] [address]    Report Status PENDING  Incomplete  Culture, blood (routine x 2)     Status: None (Preliminary result)   Collection Time: 03/07/17  4:20 AM  Result Value Ref Range Status   Specimen Description BLOOD LEFT ARM  Final   Special Requests BOTTLES DRAWN AEROBIC AND ANAEROBIC BCAV  Final   Culture   Final     NO GROWTH 1 DAY Performed at [facility], [address], [place], [place] [address]    Report Status PENDING  Incomplete         Radiology Studies: No results found.      Scheduled Meds: . busPIRone  5 mg Oral BID  . citalopram  20 mg Oral Daily  . clindamycin  300 mg Oral Q8H  . docusate sodium  100 mg Oral BID  . enoxaparin (LOVENOX) injection  40 mg Subcutaneous Q24H  . famotidine  20 mg Oral Daily  . folic acid  1 mg Oral Daily  . gabapentin  100 mg  Oral TID  . nicotine  7 mg Transdermal Daily  . thiamine  100 mg Oral Daily  . valACYclovir  1,000 mg Oral TID   Continuous Infusions: . sodium chloride 75 mL/hr at 03/09/17 0720     LOS: 1 day    Time spent:35 minutes.     Alba Cory, MD [facility] Pager [phone]  If 7PM-7AM, please contact night-coverage [email or web address] Password [email or web address] 03/09/2017, 1:25 PM

## 2017-03-09 NOTE — Clinical Social Work Note (Signed)
Clinical Social Work Assessment  Patient Details  Name: Edwin Martinez MRN: [id number] Date of Birth: October 12, 1985  Date of referral:  03/09/17               Reason for consult:  Mental Health Concerns, Substance Use/ETOH Abuse, Housing Concerns/Homelessness                Permission sought to share information with:    Permission granted to share information::     Name::        Agency::     Relationship::     Contact Information:     Housing/Transportation Living arrangements for the past 2 months:  Homeless Source of Information:    Patient Interpreter Needed:  None Criminal Activity/Legal Involvement Pertinent to Current Situation/Hospitalization:  No - Comment as needed Significant Relationships:  Friend Lives with:  Self Do you feel safe going back to the place where you live?  Yes Need for family participation in patient care:  No (Coment)  Care giving concerns:  No caregiving concerns reported. Pt presented to ED reporting SI and AH, admitted to hospital for treatment of shingles   Social Worker assessment / plan:  CSW consulted to address MH/SA issues and reports of homelessness.  Met with pt and explained role. Pt receptive to discussing above. Pt was hospitalized at [facility] approximately 2.5 months ago due to [facility], SA, and SI. Reports he did not follow up with outpatient care due to transportation issues thus did not have any medications prior to this hospital admission. Reports he has not used cocaine since last hospitalization, reports regular THC use "self medicating," CSW noted pt's UDS + for cocaine, THC, and amphetamines.  Pt reports he is homeless and stays in shelters when there is a bed and on park benches when shelters full. Has applied for disability and Medicaid. Reports no family in area, has meaningful relationships with some friends, whom he notes are also homeless. Pt denies current SI and does not exhibit symptoms of psychosis. Reports "I had those thoughts  when I came in the ED and was hearing voices then, but I feel better since I got back on medication." Upon attending MD entering room pt then endorsed current SI.  Affect was pleasant and appropriate, did not appear to be responding to internal stimuli. Oriented x4.  Pt reports as far as OP follow up he would prefer [facility] as he could walk to appointments. Hopes medicaid will be approved and he can utilize [facility] for appointments. Pt reports he understands psychiatry has recommended when pt medically stable he be re-evaluated for psychiatric disposition recommendation. He states he is willing to comply with any recommendations made.  Plan: CSW will follow for psychiatric recommendations. Addresses homelessness and will plan for MH/SA follow up pending recommendations.    Employment status:  Unemployed Forensic scientist:  Self Pay (Medicaid Pending) PT Recommendations:  Not assessed at this time Information / Referral to community resources:     Patient/Family's Response to care:  Pt appreciative of care received, notes that [facility] is "the most welcoming and nonjudgmental"  Patient/Family's Understanding of and Emotional Response to Diagnosis, Current Treatment, and Prognosis:  Pt demonstrates adequate understanding of plan and is well-informed in community resources. Seems hopeful and positive regarding his prognosis and notes that current medication regimen "has made him feel really good"  Emotional Assessment Appearance:  Appears stated age Attitude/Demeanor/Rapport:   (cooperative, engaged, incongruent with report of depressed  mood) Affect (typically observed):  Pleasant, Hopeful Orientation:  Oriented to Self, Oriented to Place, Oriented to  Time, Oriented to Situation Alcohol / Substance use:    Psych involvement (Current and /or in the community):  Yes (Comment)  Discharge Needs  Concerns to be addressed:  Mental Health Concerns, Substance Abuse  Concerns, Homelessness Readmission within the last 30 days:  Yes Current discharge risk:   (Still assessing) Barriers to Discharge:  Continued Medical Work up   [employer], LCSW 03/09/2017, 11:48 AM  [phone]

## 2017-03-10 NOTE — Progress Notes (Signed)
PROGRESS NOTE  Edwin Martinez UEA:[id number] DOB: 09/26/1986 DOA: 03/06/2017 PCP: Judyann Munson, MD  HPI/Recap of past 109 hours:  31 year old male past mental history for HIV untreated was polysubstance abuse and bipolar disorder with schizo affective component presented on 6/9 with shingles to the right upper chest, shoulder and back plus suicidal ideations and hallucinations. Patient admitted to the hospitalist service. Infectious disease and psychiatry consulted.  At this time, patient reports feeling still depressed, but better. Still some pain at area of shingles infection, but says that pain medicine is helping control, denies any trouble breathing.  Assessment/Plan: Principal Problem:   Herpes zoster with superimposed bacterial infection. Seem to ID. Started on Neurontin 3 times a day plus acyclovir every 8 hours-now changed to Valtrex. Also in mycin for superimposed infection. Active Problems:   Human immunodeficiency virus (HIV) disease (HCC): Uncontrolled. Seen by infectious disease.  CD4 count of 650 with viral load recently around 200,000. On antiretroviral   Tobacco use disorder: Nicotine patch    Schizoaffective disorder, bipolar type (HCC) with suicidal ideation: Seen by psychiatry. On BuSpar and Celexa. Continue with sitter patient still voices feeling very depressed and suicidal possible transfer to inpatient psych facility once she was stabilized   Polysubstance abuse irrigation positive for marijuana, cocaine and amphetamine. Counseled.   Homelessness: Social work consult   Fever Family history of Huntington's disease: Patient at some point in the future should undergo genetic testing, but at this time other issues at the forefront    Code Status: Full code   Family Communication: no family present   Disposition Plan: DC to psychiatric facility once bed available & shingles resolved   Consultants:  ID  Psyche   Procedures:  None   Antimicrobials:  IV  acyclovir  6/9-6/12  Valtrex 6/12-present  IV clindamycin 6/11-present  DVT prophylaxis: Lovenox   Objective: Vitals:   03/09/17 1400 03/09/17 2218 03/10/17 0500 03/10/17 1408  BP: (!) 118/48 109/64 117/71 (!) 98/48  Pulse: (!) 59 (!) 50 (!) 47 (!) 54  Resp: 16  16 14   Temp: 97.8 F (36.6 C) 97.8 F (36.6 C) 97.4 F (36.3 C) 97.6 F (36.4 C)  TempSrc: Oral Oral Oral Oral  SpO2: 99% 99% 99% 99%  Weight:      Height:        Intake/Output Summary (Last 24 hours) at 03/10/17 1454 Last data filed at 03/10/17 1409  Gross per 24 hour  Intake             1490 ml  Output                0 ml  Net             1490 ml   Filed Weights   03/06/17 2059  Weight: 70.6 kg (155 lb 10.3 oz)    Exam:   General:  Alert and oriented 3, no acute distress. Interactive   HEENT: Normocephalic, atraumatic, mucous membranes are slightly dry  Cardiovascular: regular rate and rhythm, S1-S2   Respiratory: clear auscultation bilaterally, no labored breathing   Abdomen: soft, nontender, nondistended, positive bowel sounds   Musculoskeletal:  No clubbing or cyanosis or edema  Skin: no skin breaks or tears. Patient has signs consistent with dermatomal distribution of shingles over anterior aspect of right side of chest, right shoulder, back and right side of neck.  Psychiatry: Patient is appropriate, no evidence of apparent hallucinations   Data Reviewed: CBC:  Recent Labs Lab 03/06/17 1707  03/07/17 0420  WBC 6.5 5.8  NEUTROABS 3.2  --   HGB 13.9 13.8  HCT 41.3 41.5  MCV 82.6 82.7  PLT 205 191   Basic Metabolic Panel:  Recent Labs Lab 03/06/17 1707 03/07/17 0420 03/08/17 1117  NA 135 137 138  K 3.7 3.7 3.7  CL 99* 104 103  CO2 26 27 29   GLUCOSE 87 94 81  BUN 11 11 10   CREATININE 0.99 0.90 0.92  CALCIUM 8.9 8.6* 8.9   GFR: Estimated Creatinine Clearance: 108.8 mL/min (by C-G formula based on SCr of 0.92 mg/dL). Liver Function Tests:  Recent Labs Lab  03/06/17 1707  AST 26  ALT 20  ALKPHOS 61  BILITOT 0.5  PROT 7.9  ALBUMIN 4.1   No results for input(s): LIPASE, AMYLASE in the last 168 hours. No results for input(s): AMMONIA in the last 168 hours. Coagulation Profile: No results for input(s): INR, PROTIME in the last 168 hours. Cardiac Enzymes: No results for input(s): CKTOTAL, CKMB, CKMBINDEX, TROPONINI in the last 168 hours. BNP (last 3 results) No results for input(s): PROBNP in the last 8760 hours. HbA1C: No results for input(s): HGBA1C in the last 72 hours. CBG: No results for input(s): GLUCAP in the last 168 hours. Lipid Profile: No results for input(s): CHOL, HDL, LDLCALC, TRIG, CHOLHDL, LDLDIRECT in the last 72 hours. Thyroid Function Tests: No results for input(s): TSH, T4TOTAL, FREET4, T3FREE, THYROIDAB in the last 72 hours. Anemia Panel: No results for input(s): VITAMINB12, FOLATE, FERRITIN, TIBC, IRON, RETICCTPCT in the last 72 hours. Urine analysis:    Component Value Date/Time   COLORURINE YELLOW 03/06/2017 1707   APPEARANCEUR CLEAR 03/06/2017 1707   LABSPEC 1.021 03/06/2017 1707   PHURINE 5.0 03/06/2017 1707   GLUCOSEU 50 (A) 03/06/2017 1707   GLUCOSEU NEG mg/dL 13/24/4010 2725   HGBUR NEGATIVE 03/06/2017 1707   BILIRUBINUR NEGATIVE 03/06/2017 1707   KETONESUR NEGATIVE 03/06/2017 1707   PROTEINUR NEGATIVE 03/06/2017 1707   UROBILINOGEN 1 09/06/2012 1106   NITRITE NEGATIVE 03/06/2017 1707   LEUKOCYTESUR NEGATIVE 03/06/2017 1707   Sepsis Labs: @LABRCNTIP (procalcitonin:4,lacticidven:4)  ) Recent Results (from the past 240 hour(s))  Culture, blood (routine x 2)     Status: None (Preliminary result)   Collection Time: 03/07/17  4:20 AM  Result Value Ref Range Status   Specimen Description BLOOD LEFT ARM  Final   Special Requests BOTTLES DRAWN AEROBIC AND ANAEROBIC BCAV  Final   Culture   Final    NO GROWTH 3 DAYS Performed at [facility], [address] [place], [place] [address]     Report Status PENDING  Incomplete  Culture, blood (routine x 2)     Status: None (Preliminary result)   Collection Time: 03/07/17  4:20 AM  Result Value Ref Range Status   Specimen Description BLOOD LEFT ARM  Final   Special Requests BOTTLES DRAWN AEROBIC AND ANAEROBIC BCAV  Final   Culture   Final    NO GROWTH 3 DAYS Performed at [facility], [address] [place], [place] [address]    Report Status PENDING  Incomplete      Studies: No results found.  Scheduled Meds: . abacavir-dolutegravir-lamiVUDine  1 tablet Oral Daily  . busPIRone  5 mg Oral BID  . citalopram  20 mg Oral Daily  . clindamycin  300 mg Oral Q8H  . docusate sodium  100 mg Oral BID  . enoxaparin (LOVENOX) injection  40 mg Subcutaneous Q24H  . famotidine  20 mg Oral Daily  . folic acid  1 mg Oral Daily  . gabapentin  100 mg Oral TID  . nicotine  7 mg Transdermal Daily  . thiamine  100 mg Oral Daily  . valACYclovir  1,000 mg Oral TID    Continuous Infusions: . sodium chloride 1,000 mL (03/10/17 1008)     LOS: 2 days     Hollice EspyKRISHNAN,Emelin Dascenzo K, MD [facility] Pager [phone]  If 7PM-7AM, please contact night-coverage [email or web address] Password [email or web address] 03/10/2017, 2:54 PM

## 2017-03-10 NOTE — Progress Notes (Signed)
Patient ID: Edwin Martinez, male   DOB: April 29, 1986, 31 y.o.   MRN: [id number]          [facility]  Date of Admission:  03/06/2017           Day 5 shingles therapy        Day 3 clindamycin  Principal Problem:   Herpes zoster Active Problems:   Fever   Human immunodeficiency virus (HIV) disease (HCC)   Suicidal ideation   Schizoaffective disorder, bipolar type (HCC)   Tobacco use disorder   Polysubstance abuse   Homelessness   . abacavir-dolutegravir-lamiVUDine  1 tablet Oral Daily  . busPIRone  5 mg Oral BID  . citalopram  20 mg Oral Daily  . clindamycin  300 mg Oral Q8H  . docusate sodium  100 mg Oral BID  . enoxaparin (LOVENOX) injection  40 mg Subcutaneous Q24H  . famotidine  20 mg Oral Daily  . folic acid  1 mg Oral Daily  . gabapentin  100 mg Oral TID  . nicotine  7 mg Transdermal Daily  . thiamine  100 mg Oral Daily  . valACYclovir  1,000 mg Oral TID    SUBJECTIVE: He is still having pain related to his right upper trunk shingles.  Review of Systems: Review of Systems  Constitutional: Negative for chills, diaphoresis, fever, malaise/fatigue and weight loss.  HENT: Negative for sore throat.   Respiratory: Negative for cough, sputum production and shortness of breath.   Cardiovascular: Negative for chest pain.  Gastrointestinal: Negative for abdominal pain, diarrhea, heartburn, nausea and vomiting.  Genitourinary: Negative for dysuria and frequency.  Musculoskeletal: Negative for joint pain and myalgias.  Skin: Positive for rash.  Neurological: Negative for dizziness and headaches.  Psychiatric/Behavioral: Positive for depression, hallucinations, substance abuse and suicidal ideas. The patient is not nervous/anxious.     Past Medical History:  Diagnosis Date  . ADHD (attention deficit hyperactivity disorder) 09/12/2012  . Anxiety   . Asthma   . Bipolar 1 disorder (HCC)   . HIV (human immunodeficiency virus infection) (HCC)     . Hypertension   . Schizophrenia (HCC)   . Seizures (HCC)     Social History  Substance Use Topics  . Smoking status: Current Every Day Smoker    Packs/day: 0.50    Types: Cigarettes    Start date: 09/29/1991  . Smokeless tobacco: Never Used  . Alcohol use 1.2 oz/week    2 Standard drinks or equivalent per week     Comment: little recently    Family History  Problem Relation Age of Onset  . Huntington's disease Father   . Heart disease Mother   . Suicidality Maternal Uncle   . Suicidality Maternal Grandmother    Allergies  Allergen Reactions  . Magnesium-Containing Compounds Other (See Comments)    This medication is contraindicated with pts HIV meds.    . Peanut-Containing Drug Products Anaphylaxis  . Esomeprazole Magnesium Cough  . Atripla [Efavirenz-Emtricitab-Tenofovir] Other (See Comments)    Reaction:  Suicidal thoughts   . Bactrim [Sulfamethoxazole-Trimethoprim] Rash  . Penicillins Rash and Other (See Comments)    Has patient had a PCN reaction causing immediate rash, facial/tongue/throat swelling, SOB or lightheadedness with hypotension: Yes Has patient had a PCN reaction causing severe rash involving mucus membranes or skin necrosis: No Has patient had a PCN reaction that required hospitalization No Has patient had a PCN reaction occurring within the last 10 years: No If all of the  above answers are "NO", then may proceed with Cephalosporin use.    OBJECTIVE: Vitals:   03/09/17 0602 03/09/17 1400 03/09/17 2218 03/10/17 0500  BP: 121/64 (!) 118/48 109/64 117/71  Pulse: (!) 59 (!) 59 (!) 50 (!) 47  Resp: 16 16  16   Temp: 98.7 F (37.1 C) 97.8 F (36.6 C) 97.8 F (36.6 C) 97.4 F (36.3 C)  TempSrc: Oral Oral Oral Oral  SpO2: 99% 99% 99% 99%  Weight:      Height:       Body mass index is 24.38 kg/m.  Physical Exam  Constitutional: He is oriented to person, place, and time.  He is sitting up in bed eating breakfast. He appears comfortable and in no  distress.  HENT:  Mouth/Throat: No oropharyngeal exudate.  Eyes: Conjunctivae are normal.  Neck: Neck supple.  Cardiovascular: Normal rate and regular rhythm.   No murmur heard. Pulmonary/Chest: Effort normal and breath sounds normal.  Abdominal: Soft. There is no tenderness.  Musculoskeletal: Normal range of motion. He exhibits no edema or tenderness.  Neurological: He is alert and oriented to person, place, and time.  Skin: Rash noted.  His vesicular and pustular rash continues to improve.   Psychiatric: Mood and affect normal.    Lab Results Lab Results  Component Value Date   WBC 5.8 03/07/2017   HGB 13.8 03/07/2017   HCT 41.5 03/07/2017   MCV 82.7 03/07/2017   PLT 191 03/07/2017    Lab Results  Component Value Date   CREATININE 0.92 03/08/2017   BUN 10 03/08/2017   NA 138 03/08/2017   K 3.7 03/08/2017   CL 103 03/08/2017   CO2 29 03/08/2017    Lab Results  Component Value Date   ALT 20 03/06/2017   AST 26 03/06/2017   ALKPHOS 61 03/06/2017   BILITOT 0.5 03/06/2017    HIV 1 RNA Quant (copies/mL)  Date Value  07/18/2016 100  09/11/2015 <20  02/05/2015 <20   CD4 T Cell Abs (/uL)  Date Value  03/07/2017 650  12/17/2016 1,160  07/18/2016 2,020    Microbiology: Recent Results (from the past 240 hour(s))  Culture, blood (routine x 2)     Status: None (Preliminary result)   Collection Time: 03/07/17  4:20 AM  Result Value Ref Range Status   Specimen Description BLOOD LEFT ARM  Final   Special Requests BOTTLES DRAWN AEROBIC AND ANAEROBIC BCAV  Final   Culture   Final    NO GROWTH 2 DAYS Performed at [facility], [address], [place], [place] [address]    Report Status PENDING  Incomplete  Culture, blood (routine x 2)     Status: None (Preliminary result)   Collection Time: 03/07/17  4:20 AM  Result Value Ref Range Status   Specimen Description BLOOD LEFT ARM  Final   Special Requests BOTTLES DRAWN AEROBIC AND ANAEROBIC BCAV  Final   Culture    Final    NO GROWTH 2 DAYS Performed at [facility], [address], [place], [place] [address]    Report Status PENDING  Incomplete     ASSESSMENT: He is improving on therapy for shingles and probable secondary bacterial infection. I recommend continuing value acyclovir and clindamycin 2 more days.  PLAN: 1. Continue valacyclovir and clindamycin 2. Continue Triumeq 3. He has a visit with Dr. [name] schedule at [facility] on 04/05/2017  Cliffton Asters, MD [facility] [phone]  pager   [phone] cell 03/10/2017, 10:12 AM

## 2017-03-11 DIAGNOSIS — F250 Schizoaffective disorder, bipolar type: Secondary | ICD-10-CM

## 2017-03-11 NOTE — Progress Notes (Signed)
Patient complained of iv causing discomfort and irritation. IV fluid discontinued per MD order

## 2017-03-11 NOTE — Progress Notes (Signed)
[facility]  PROGRESS NOTE  Edwin Martinez DOB: 1985-12-23 DOA: 03/06/2017 PCP: Judyann Munson, MD   Brief HPI:    31 year old male past mental history for HIV untreated was polysubstance abuse and bipolar disorder with schizo affective component presented on 6/9 with shingles to the right upper chest, shoulder and back plus suicidal ideations and hallucinations. Patient admitted to the hospitalist service. Infectious disease and psychiatry consulted.    Subjective   This morning patient denies pain. Denies shortness of breath. He still complains of hearing voices. Patient still having suicidal ideation.   Assessment/Plan:     1. Herpes zoster- with superimposed bacterial infection, started on Neurontin 3 times a day, Valtrex, clindamycin. ID recommends continuing Valtrex and clindamycin 2 more days. Stop after 03/12/2017. 2. HIV- CD4 count 650, viral load around 200,000. Started on antiretroviral therapy per ID. Patient has an appointment to see Dr. Ilsa Iha as outpatient. 3. Schizoaffective disorder- vision hearing voices, currently on BuSpar and Celexa. He still having suicidal thoughts and hearing voices about hurting himself. I have again called psychiatry consultation for further recommendations. Patient possibly need to transfer to inpatient psych facility. 4. Polysubstance abuse- social work consulted 5. Homelessness-social worker consulted 6. Family history of Huntington's disease- will need outpatient workup once above issues are stabilized.    DVT prophylaxis: Lovenox  Code Status: Full code  Family Communication:  No family at bedside Disposition Plan:  Inpatient psych facility   Consultants:   Infectious disease  Psychiatry  Procedures:   None   Antibiotics:   Anti-infectives    Start     Dose/Rate Route Frequency Ordered Stop   03/09/17 1700  abacavir-dolutegravir-lamiVUDine (TRIUMEQ) 600-50-300 MG per tablet 1 tablet     1  tablet Oral Daily 03/09/17 1520     03/08/17 1400  clindamycin (CLEOCIN) capsule 300 mg     300 mg Oral Every 8 hours 03/08/17 1153     03/08/17 1200  valACYclovir (VALTREX) tablet 1,000 mg     1,000 mg Oral 3 times daily 03/08/17 1153     03/07/17 0200  acyclovir (ZOVIRAX) 725 mg in dextrose 5 % 150 mL IVPB  Status:  Discontinued     10 mg/kg  72.6 kg 164.5 mL/hr over 60 Minutes Intravenous Every 8 hours 03/06/17 2039 03/08/17 1153   03/06/17 1730  acyclovir (ZOVIRAX) 725 mg in dextrose 5 % 150 mL IVPB     10 mg/kg  72.6 kg 164.5 mL/hr over 60 Minutes Intravenous  Once 03/06/17 1712 03/06/17 2007       Objective   Vitals:   03/10/17 1408 03/10/17 2145 03/11/17 0438 03/11/17 1334  BP: (!) 98/48 (!) 105/58 128/80 (!) 109/49  Pulse: (!) 54 64 (!) 50 60  Resp: 14 16 18 18   Temp: 97.6 F (36.4 C) 98.7 F (37.1 C) 98 F (36.7 C) 97.1 F (36.2 C)  TempSrc: Oral Oral Oral Oral  SpO2: 99% 98% 100% 99%  Weight:      Height:        Intake/Output Summary (Last 24 hours) at 03/11/17 1430 Last data filed at 03/11/17 1225  Gross per 24 hour  Intake           3067.5 ml  Output                0 ml  Net           3067.5 ml   Filed Weights   03/06/17 2059  Weight: 70.6 kg (  155 lb 10.3 oz)     Physical Examination:   Physical Exam: Eyes: No icterus, extraocular muscles intact  Mouth: Oral mucosa is moist, no lesions on palate,  Neck: Supple, no deformities, masses, or tenderness Lungs: Normal respiratory effort, bilateral clear to auscultation, no crackles or wheezes.  Heart: Regular rate and rhythm, S1 and S2 normal, no murmurs, rubs auscultated Abdomen: BS normoactive,soft,nondistended,non-tender to palpation,no organomegaly Extremities: No pretibial edema, no erythema, no cyanosis, no clubbing Neuro : Alert and oriented to time, place and person, No focal deficits  Skin: Multiple papules, dried crusted lesion noted over the right anterior chest shoulder and upper back.  Areas of skin denudation noted in upper right back.     Data Reviewed: I have personally reviewed following labs and imaging studies  CBG: No results for input(s): GLUCAP in the last 168 hours.  CBC:  Recent Labs Lab 03/06/17 1707 03/07/17 0420  WBC 6.5 5.8  NEUTROABS 3.2  --   HGB 13.9 13.8  HCT 41.3 41.5  MCV 82.6 82.7  PLT 205 191    Basic Metabolic Panel:  Recent Labs Lab 03/06/17 1707 03/07/17 0420 03/08/17 1117  NA 135 137 138  K 3.7 3.7 3.7  CL 99* 104 103  CO2 26 27 29   GLUCOSE 87 94 81  BUN 11 11 10   CREATININE 0.99 0.90 0.92  CALCIUM 8.9 8.6* 8.9    Recent Results (from the past 240 hour(s))  Culture, blood (routine x 2)     Status: None (Preliminary result)   Collection Time: 03/07/17  4:20 AM  Result Value Ref Range Status   Specimen Description BLOOD LEFT ARM  Final   Special Requests BOTTLES DRAWN AEROBIC AND ANAEROBIC BCAV  Final   Culture   Final    NO GROWTH 3 DAYS Performed at [facility], [address] [place], [place] [address]    Report Status PENDING  Incomplete  Culture, blood (routine x 2)     Status: None (Preliminary result)   Collection Time: 03/07/17  4:20 AM  Result Value Ref Range Status   Specimen Description BLOOD LEFT ARM  Final   Special Requests BOTTLES DRAWN AEROBIC AND ANAEROBIC BCAV  Final   Culture   Final    NO GROWTH 3 DAYS Performed at [facility], [address] [place], [place] [address]    Report Status PENDING  Incomplete     Liver Function Tests:  Recent Labs Lab 03/06/17 1707  AST 26  ALT 20  ALKPHOS 61  BILITOT 0.5  PROT 7.9  ALBUMIN 4.1   No results for input(s): LIPASE, AMYLASE in the last 168 hours. No results for input(s): AMMONIA in the last 168 hours.  Cardiac Enzymes: No results for input(s): CKTOTAL, CKMB, CKMBINDEX, TROPONINI in the last 168 hours. BNP (last 3 results) No results for input(s): BNP in the last 8760 hours.  ProBNP (last 3 results) No results  for input(s): PROBNP in the last 8760 hours.    Studies: No results found.  Scheduled Meds: . abacavir-dolutegravir-lamiVUDine  1 tablet Oral Daily  . busPIRone  5 mg Oral BID  . citalopram  20 mg Oral Daily  . clindamycin  300 mg Oral Q8H  . docusate sodium  100 mg Oral BID  . enoxaparin (LOVENOX) injection  40 mg Subcutaneous Q24H  . famotidine  20 mg Oral Daily  . folic acid  1 mg Oral Daily  . gabapentin  100 mg Oral TID  .  nicotine  7 mg Transdermal Daily  . thiamine  100 mg Oral Daily  . valACYclovir  1,000 mg Oral TID      Time spent: 25 min  [facility]   [facility] Pager [phone]. If 7PM-7AM, please contact night-coverage at [email or web address], Office  [phone]  password [email or web address] 03/11/2017, 2:30 PM  LOS: 3 days

## 2017-03-11 NOTE — Progress Notes (Addendum)
Patient ID: Edwin Martinez, male   DOB: 11-Oct-1985, 31 y.o.   MRN: [id number]          [facility] for Infectious Disease  Date of Admission:  03/06/2017           Day 6 shingles therapy        Day 4 clindamycin  Principal Problem:   Herpes zoster Active Problems:   Fever   Human immunodeficiency virus (HIV) disease (HCC)   Suicidal ideation   Schizoaffective disorder, bipolar type (HCC)   Tobacco use disorder   Polysubstance abuse   Homelessness   . abacavir-dolutegravir-lamiVUDine  1 tablet Oral Daily  . busPIRone  5 mg Oral BID  . citalopram  20 mg Oral Daily  . clindamycin  300 mg Oral Q8H  . docusate sodium  100 mg Oral BID  . enoxaparin (LOVENOX) injection  40 mg Subcutaneous Q24H  . famotidine  20 mg Oral Daily  . folic acid  1 mg Oral Daily  . gabapentin  100 mg Oral TID  . nicotine  7 mg Transdermal Daily  . thiamine  100 mg Oral Daily  . valACYclovir  1,000 mg Oral TID    SUBJECTIVE: He is feeling much better today. He is having less pain related to his shingles. He tells me that he is still feeling mentally stable and hopes he can get a bed at [facility] soon.  Review of Systems: Review of Systems  Constitutional: Negative for chills, diaphoresis, fever, malaise/fatigue and weight loss.  HENT: Negative for sore throat.   Respiratory: Negative for cough, sputum production and shortness of breath.   Cardiovascular: Negative for chest pain.  Gastrointestinal: Negative for abdominal pain, diarrhea, heartburn, nausea and vomiting.  Genitourinary: Negative for dysuria and frequency.  Musculoskeletal: Negative for joint pain and myalgias.  Skin: Positive for rash.  Neurological: Negative for dizziness and headaches.  Psychiatric/Behavioral: Positive for depression, hallucinations, substance abuse and suicidal ideas. The patient is not nervous/anxious.     Past Medical History:  Diagnosis Date  . ADHD (attention deficit hyperactivity  disorder) 09/12/2012  . Anxiety   . Asthma   . Bipolar 1 disorder (HCC)   . HIV (human immunodeficiency virus infection) (HCC)   . Hypertension   . Schizophrenia (HCC)   . Seizures (HCC)     Social History  Substance Use Topics  . Smoking status: Current Every Day Smoker    Packs/day: 0.50    Types: Cigarettes    Start date: 09/29/1991  . Smokeless tobacco: Never Used  . Alcohol use 1.2 oz/week    2 Standard drinks or equivalent per week     Comment: little recently    Family History  Problem Relation Age of Onset  . Huntington's disease Father   . Heart disease Mother   . Suicidality Maternal Uncle   . Suicidality Maternal Grandmother    Allergies  Allergen Reactions  . Magnesium-Containing Compounds Other (See Comments)    This medication is contraindicated with pts HIV meds.    . Peanut-Containing Drug Products Anaphylaxis  . Esomeprazole Magnesium Cough  . Atripla [Efavirenz-Emtricitab-Tenofovir] Other (See Comments)    Reaction:  Suicidal thoughts   . Bactrim [Sulfamethoxazole-Trimethoprim] Rash  . Penicillins Rash and Other (See Comments)    Has patient had a PCN reaction causing immediate rash, facial/tongue/throat swelling, SOB or lightheadedness with hypotension: Yes Has patient had a PCN reaction causing severe rash involving mucus membranes or skin necrosis: No Has patient had  a PCN reaction that required hospitalization No Has patient had a PCN reaction occurring within the last 10 years: No If all of the above answers are "NO", then may proceed with Cephalosporin use.    OBJECTIVE: Vitals:   03/10/17 1408 03/10/17 2145 03/11/17 0438 03/11/17 1334  BP: (!) 98/48 (!) 105/58 128/80 (!) 109/49  Pulse: (!) 54 64 (!) 50 60  Resp: 14 16 18 18   Temp: 97.6 F (36.4 C) 98.7 F (37.1 C) 98 F (36.7 C) 97.1 F (36.2 C)  TempSrc: Oral Oral Oral Oral  SpO2: 99% 98% 100% 99%  Weight:      Height:       Body mass index is 24.38 kg/m.  Physical Exam    Constitutional: He is oriented to person, place, and time.  He is much more talkative and in good spirits today. He is sitting up on the side of his bed.  HENT:  Mouth/Throat: No oropharyngeal exudate.  Eyes: Conjunctivae are normal.  Neck: Neck supple.  Cardiovascular: Normal rate and regular rhythm.   No murmur heard. Pulmonary/Chest: Effort normal and breath sounds normal.  Abdominal: Soft. There is no tenderness.  Musculoskeletal: Normal range of motion. He exhibits no edema or tenderness.  Neurological: He is alert and oriented to person, place, and time.  Skin: Rash noted.  His vesicular and pustular rash is looking much better over the past 24 hours.All lesions are drying and crusting. The pustules are resolving.   Psychiatric: Mood and affect normal.    Lab Results Lab Results  Component Value Date   WBC 5.8 03/07/2017   HGB 13.8 03/07/2017   HCT 41.5 03/07/2017   MCV 82.7 03/07/2017   PLT 191 03/07/2017    Lab Results  Component Value Date   CREATININE 0.92 03/08/2017   BUN 10 03/08/2017   NA 138 03/08/2017   K 3.7 03/08/2017   CL 103 03/08/2017   CO2 29 03/08/2017    Lab Results  Component Value Date   ALT 20 03/06/2017   AST 26 03/06/2017   ALKPHOS 61 03/06/2017   BILITOT 0.5 03/06/2017    HIV 1 RNA Quant (copies/mL)  Date Value  07/18/2016 100  09/11/2015 <20  02/05/2015 <20   CD4 T Cell Abs (/uL)  Date Value  03/07/2017 650  12/17/2016 1,160  07/18/2016 2,020    Microbiology: Recent Results (from the past 240 hour(s))  Culture, blood (routine x 2)     Status: None (Preliminary result)   Collection Time: 03/07/17  4:20 AM  Result Value Ref Range Status   Specimen Description BLOOD LEFT ARM  Final   Special Requests BOTTLES DRAWN AEROBIC AND ANAEROBIC BCAV  Final   Culture   Final    NO GROWTH 4 DAYS Performed at [facility], [address] [place], [place] [address]    Report Status PENDING  Incomplete  Culture, blood (routine x  2)     Status: None (Preliminary result)   Collection Time: 03/07/17  4:20 AM  Result Value Ref Range Status   Specimen Description BLOOD LEFT ARM  Final   Special Requests BOTTLES DRAWN AEROBIC AND ANAEROBIC BCAV  Final   Culture   Final    NO GROWTH 4 DAYS Performed at [facility], [address] [place], [place] [address]    Report Status PENDING  Incomplete     ASSESSMENT: He is improving on therapy for shingles and probable secondary bacterial infection. I recommend continuing  value acyclovir and clindamycin 1 more day.  PLAN: 1. Continue valacyclovir and clindamycin 1 more day 2. Continue Triumeq 3. He has a visit with Dr. [name] schedule at [facility] on 04/05/2017 4. I will sign off now  Cliffton Asters, MD [facility] for Infectious Disease [facility] [phone] pager   [phone] cell 03/11/2017, 4:15 PM

## 2017-03-12 DIAGNOSIS — F509 Eating disorder, unspecified: Secondary | ICD-10-CM | POA: Diagnosis not present

## 2017-03-12 DIAGNOSIS — G4700 Insomnia, unspecified: Secondary | ICD-10-CM | POA: Diagnosis not present

## 2017-03-12 DIAGNOSIS — F1290 Cannabis use, unspecified, uncomplicated: Secondary | ICD-10-CM | POA: Diagnosis not present

## 2017-03-12 DIAGNOSIS — F17210 Nicotine dependence, cigarettes, uncomplicated: Secondary | ICD-10-CM | POA: Diagnosis not present

## 2017-03-12 DIAGNOSIS — F250 Schizoaffective disorder, bipolar type: Secondary | ICD-10-CM | POA: Diagnosis not present

## 2017-03-12 DIAGNOSIS — F1910 Other psychoactive substance abuse, uncomplicated: Secondary | ICD-10-CM | POA: Diagnosis not present

## 2017-03-12 DIAGNOSIS — F1490 Cocaine use, unspecified, uncomplicated: Secondary | ICD-10-CM | POA: Diagnosis not present

## 2017-03-12 DIAGNOSIS — Z590 Homelessness: Secondary | ICD-10-CM | POA: Diagnosis not present

## 2017-03-12 DIAGNOSIS — B029 Zoster without complications: Secondary | ICD-10-CM | POA: Diagnosis not present

## 2017-03-12 LAB — CULTURE, BLOOD (ROUTINE X 2)
Culture: NO GROWTH
Culture: NO GROWTH

## 2017-03-12 MED ORDER — GABAPENTIN 100 MG PO CAPS: 100.0000 mg | ORAL_CAPSULE | Freq: Three times a day (TID) | ORAL | 0 refills | Status: DC

## 2017-03-12 MED ORDER — CITALOPRAM HYDROBROMIDE 20 MG PO TABS: 20.0000 mg | ORAL_TABLET | Freq: Every day | ORAL | 0 refills | Status: DC

## 2017-03-12 MED ORDER — ABACAVIR-DOLUTEGRAVIR-LAMIVUD 600-50-300 MG PO TABS: 1.0000 | ORAL_TABLET | Freq: Every day | ORAL | Status: DC

## 2017-03-12 MED ORDER — TRAZODONE HCL 50 MG PO TABS: 50.0000 mg | ORAL_TABLET | Freq: Every evening | ORAL | 0 refills | Status: DC | PRN

## 2017-03-12 MED ORDER — BUSPIRONE HCL 5 MG PO TABS: 5.0000 mg | ORAL_TABLET | Freq: Two times a day (BID) | ORAL | 0 refills | Status: DC

## 2017-03-12 MED ORDER — RISPERIDONE 1 MG PO TABS: 1.0000 mg | ORAL_TABLET | Freq: Two times a day (BID) | ORAL | 0 refills | Status: DC

## 2017-03-12 MED ORDER — RISPERIDONE 1 MG PO TABS: 1.0000 mg | ORAL_TABLET | Freq: Two times a day (BID) | ORAL | Status: DC

## 2017-03-12 NOTE — Progress Notes (Signed)
Nursing Discharge Summary  Patient ID: Edwin Martinez MRN: [id number] DOB/AGE: [birth year] 30 y.o.  Admit date: 03/06/2017 Discharge date: 03/12/2017  Discharged Condition: good  Disposition: 01-Home or Self Care  Follow-up Information    Judyann MunsonSnider, Cynthia, MD. Schedule an appointment as soon as possible for a visit in 1 week(s).   Specialty:  Infectious Diseases Contact information: [address] [place] [address] [phone]        [name] Follow up in 1 week(s).   Specialty:  [facility] Contact information: [address] [place] [address] [phone]           Prescriptions Given: Multiple prescriptions given.  Patient follow up appointments and medications discussed.  Patient verbalized understanding without further questions.    Means of Discharge: Patient wished to ambulate downstairs to take public transportation home via bus pass given by social work.    Signed: Gloriajean DellBaldwin, Alyvia Derk Danielle 03/12/2017, 6:47 PM

## 2017-03-12 NOTE — Progress Notes (Signed)
CSW met with pt to offer resources for substance abuse but pt refused stating, "I don't know why they sent you with that drugs is not my problem, mental health stuff is". CSW oiffered and pt accepted inpatient/outpatient SA Tx resources, however.  CSW provided education to the pt as to the efficacy of these programs for community support for those needing alcohol abd/or substance abuse treatment.  CSW offered and pt accepted outpatient resources for mental health treatment in [place], as well.  Pt appreciated CSW's efforts and thanked the CSW.  Alphonse Guild. Gurman [place], Latanya Presser, LCAS Clinical Social Worker Ph: [phone]

## 2017-03-12 NOTE — Progress Notes (Signed)
[facility]  PROGRESS NOTE  Edwin Martinez ZOX:[id number] DOB: 1986-02-15 DOA: 03/06/2017 PCP: Judyann MunsonSnider, Cynthia, MD   Brief HPI:    31 year old male past mental history for HIV untreated was polysubstance abuse and bipolar disorder with schizo affective component presented on 6/9 with shingles to the right upper chest, shoulder and back plus suicidal ideations and hallucinations. Patient admitted to the hospitalist service. Infectious disease and psychiatry consulted.    Subjective   Patient still complains of pain. Currently on tramadol when necessary. Wants to go home. Awaiting psych recommendations   Assessment/Plan:     1. Herpes zoster- with superimposed bacterial infection, started on Neurontin 3 times a day, Valtrex, clindamycin. ID recommends continuing Valtrex and clindamycin 2 more days. We'll stop after today's dose. 2. HIV- CD4 count 650, viral load around 200,000. Started on antiretroviral therapy per ID. Patient has an appointment to see Dr. Ilsa IhaSnyder as outpatient. 3. Schizoaffective disorder- vision hearing voices, currently on BuSpar and Celexa. He still having suicidal thoughts and hearing voices about hurting himself. Psychiatry was consulted yesterday. Awaiting psych recommendations. 4. Polysubstance abuse- social work consulted 5. Homelessness-social worker consulted 6. Family history of Huntington's disease- will need outpatient workup once above issues are stabilized.    DVT prophylaxis: Lovenox  Code Status: Full code  Family Communication:  No family at bedside Disposition Plan:  Inpatient psych facility   Consultants:   Infectious disease  Psychiatry  Procedures:   None   Antibiotics:   Anti-infectives    Start     Dose/Rate Route Frequency Ordered Stop   03/09/17 1700  abacavir-dolutegravir-lamiVUDine (TRIUMEQ) 600-50-300 MG per tablet 1 tablet     1 tablet Oral Daily 03/09/17 1520     03/08/17 1400  clindamycin (CLEOCIN) capsule  300 mg     300 mg Oral Every 8 hours 03/08/17 1153     03/08/17 1200  valACYclovir (VALTREX) tablet 1,000 mg     1,000 mg Oral 3 times daily 03/08/17 1153     03/07/17 0200  acyclovir (ZOVIRAX) 725 mg in dextrose 5 % 150 mL IVPB  Status:  Discontinued     10 mg/kg  72.6 kg 164.5 mL/hr over 60 Minutes Intravenous Every 8 hours 03/06/17 2039 03/08/17 1153   03/06/17 1730  acyclovir (ZOVIRAX) 725 mg in dextrose 5 % 150 mL IVPB     10 mg/kg  72.6 kg 164.5 mL/hr over 60 Minutes Intravenous  Once 03/06/17 1712 03/06/17 2007       Objective   Vitals:   03/11/17 0438 03/11/17 1334 03/12/17 0018 03/12/17 0601  BP: 128/80 (!) 109/49 123/80 104/60  Pulse: (!) 50 60 (!) 51 (!) 56  Resp: 18 18 18 18   Temp: 98 F (36.7 C) 97.1 F (36.2 C) 98.9 F (37.2 C) 97.4 F (36.3 C)  TempSrc: Oral Oral Oral Oral  SpO2: 100% 99% 99% 99%  Weight:      Height:        Intake/Output Summary (Last 24 hours) at 03/12/17 1244 Last data filed at 03/11/17 2200  Gross per 24 hour  Intake              720 ml  Output                0 ml  Net              720 ml   Filed Weights   03/06/17 2059  Weight: 70.6 kg (155 lb 10.3 oz)  Physical Examination:   Physical Exam: Eyes: No icterus, extraocular muscles intact  Mouth: Oral mucosa is moist, no lesions on palate,  Neck: Supple, no deformities, masses, or tenderness Lungs: Normal respiratory effort, bilateral clear to auscultation, no crackles or wheezes.  Heart: Regular rate and rhythm, S1 and S2 normal, no murmurs, rubs auscultated Abdomen: BS normoactive,soft,nondistended,non-tender to palpation,no organomegaly Extremities: No pretibial edema, no erythema, no cyanosis, no clubbing Neuro : Alert and oriented to time, place and person, No focal deficits  Skin: Skin dilatation noted in right upper back and neck area. Multiple crusted lesions noted in right shoulder and anterior chest wall     Data Reviewed: I have personally reviewed  following labs and imaging studies  CBG: No results for input(s): GLUCAP in the last 168 hours.  CBC:  Recent Labs Lab 03/06/17 1707 03/07/17 0420  WBC 6.5 5.8  NEUTROABS 3.2  --   HGB 13.9 13.8  HCT 41.3 41.5  MCV 82.6 82.7  PLT 205 191    Basic Metabolic Panel:  Recent Labs Lab 03/06/17 1707 03/07/17 0420 03/08/17 1117  NA 135 137 138  K 3.7 3.7 3.7  CL 99* 104 103  CO2 26 27 29   GLUCOSE 87 94 81  BUN 11 11 10   CREATININE 0.99 0.90 0.92  CALCIUM 8.9 8.6* 8.9    Recent Results (from the past 240 hour(s))  Culture, blood (routine x 2)     Status: None   Collection Time: 03/07/17  4:20 AM  Result Value Ref Range Status   Specimen Description BLOOD LEFT ARM  Final   Special Requests BOTTLES DRAWN AEROBIC AND ANAEROBIC BCAV  Final   Culture   Final    NO GROWTH 5 DAYS Performed at [facility], [address] [place], [place] [address]    Report Status 03/12/2017 FINAL  Final  Culture, blood (routine x 2)     Status: None   Collection Time: 03/07/17  4:20 AM  Result Value Ref Range Status   Specimen Description BLOOD LEFT ARM  Final   Special Requests BOTTLES DRAWN AEROBIC AND ANAEROBIC BCAV  Final   Culture   Final    NO GROWTH 5 DAYS Performed at [facility], [address], [place], [place] [address]    Report Status 03/12/2017 FINAL  Final     Liver Function Tests:  Recent Labs Lab 03/06/17 1707  AST 26  ALT 20  ALKPHOS 61  BILITOT 0.5  PROT 7.9  ALBUMIN 4.1   No results for input(s): LIPASE, AMYLASE in the last 168 hours. No results for input(s): AMMONIA in the last 168 hours.  Cardiac Enzymes: No results for input(s): CKTOTAL, CKMB, CKMBINDEX, TROPONINI in the last 168 hours. BNP (last 3 results) No results for input(s): BNP in the last 8760 hours.  ProBNP (last 3 results) No results for input(s): PROBNP in the last 8760 hours.    Studies: No results found.  Scheduled Meds: . abacavir-dolutegravir-lamiVUDine  1  tablet Oral Daily  . busPIRone  5 mg Oral BID  . citalopram  20 mg Oral Daily  . clindamycin  300 mg Oral Q8H  . docusate sodium  100 mg Oral BID  . enoxaparin (LOVENOX) injection  40 mg Subcutaneous Q24H  . famotidine  20 mg Oral Daily  . folic acid  1 mg Oral Daily  . gabapentin  100 mg Oral TID  . nicotine  7 mg Transdermal Daily  . thiamine  100  mg Oral Daily  . valACYclovir  1,000 mg Oral TID      Time spent: 25 min  [facility]   [facility] Pager [phone]. If 7PM-7AM, please contact night-coverage at [email or web address], Office  [phone]  password [email or web address] 03/12/2017, 12:44 PM  LOS: 4 days

## 2017-03-12 NOTE — Discharge Summary (Signed)
Physician Discharge Summary  Edwin Martinez ZOX:[id number] DOB: 12-09-1985 DOA: 03/06/2017  PCP: Judyann Munson, MD  Admit date: 03/06/2017 Discharge date: 03/12/2017  Time spent: 35* minutes  Recommendations for Outpatient Follow-up:  1. Follow up ID clinic next week 2. Follow up outpatient psych in 4 days   Discharge Diagnoses:  Principal Problem:   Herpes zoster Active Problems:   Human immunodeficiency virus (HIV) disease (HCC)   Tobacco use disorder   Suicidal ideation   Schizoaffective disorder, bipolar type (HCC)   Polysubstance abuse   Homelessness   Fever   Discharge Condition: Stable  Diet recommendation: Regular diet  Filed Weights   03/06/17 2059  Weight: 70.6 kg (155 lb 10.3 oz)    History of present illness:   31 year old male past mental history for HIV untreated was polysubstance abuse and bipolar disorder with schizo affective component presented on 6/9 with shingles to the right upper chest, shoulder and back plus suicidal ideations and hallucinations. Patient admitted to the hospitalist service. Infectious disease and psychiatry consulted.   Hospital Course:  1. Herpes zoster- with superimposed bacterial infection, started on Neurontin 3 times a day, Valtrex, clindamycin. ID recommends continuing Valtrex and clindamycin 2 more days. We'll stop after today's dose. 2. HIV- CD4 count 650, viral load around 200,000. Started on antiretroviral therapy per ID. Patient has an appointment to see Dr. Ilsa Iha as outpatient. 3. Schizoaffective disorder- vision hearing voices, currently on BuSpar and Celexa. He still having suicidal thoughts and hearing voices about hurting himself. Psychiatry was consulted yesterday, and they have cleared for discharge home. No Inpatient Psych recommended.Follow up for substance abuse rehabilitation as outpatient. 4. Polysubstance abuse- social work consulted 5. Homelessness-social worker consulted 6. Family history of  Huntington's disease- will need outpatient workup once above issues are stabilized.   Procedures:  None   Consultations:  Psychiatry  ID  Discharge Exam: Vitals:   03/12/17 0018 03/12/17 0601  BP: 123/80 104/60  Pulse: (!) 51 (!) 56  Resp: 18 18  Temp: 98.9 F (37.2 C) 97.4 F (36.3 C)     Discharge Instructions   Discharge Instructions    Diet - low sodium heart healthy    Complete by:  As directed    Increase activity slowly    Complete by:  As directed      Current Discharge Medication List    START taking these medications   Details  abacavir-dolutegravir-lamiVUDine (TRIUMEQ) 600-50-300 MG tablet Take 1 tablet by mouth daily. Qty: 30 tablet    citalopram (CELEXA) 20 MG tablet Take 1 tablet (20 mg total) by mouth daily. Qty: 4 tablet, Refills: 0      CONTINUE these medications which have CHANGED   Details  busPIRone (BUSPAR) 5 MG tablet Take 1 tablet (5 mg total) by mouth 2 (two) times daily. Qty: 8 tablet, Refills: 0    gabapentin (NEURONTIN) 100 MG capsule Take 1 capsule (100 mg total) by mouth 3 (three) times daily. Qty: 24 capsule, Refills: 0    risperiDONE (RISPERDAL) 1 MG tablet Take 1 tablet (1 mg total) by mouth 2 (two) times daily. Qty: 8 tablet, Refills: 0    traZODone (DESYREL) 50 MG tablet Take 1 tablet (50 mg total) by mouth at bedtime as needed for sleep. Qty: 4 tablet, Refills: 0      CONTINUE these medications which have NOT CHANGED   Details  albuterol (PROVENTIL HFA;VENTOLIN HFA) 108 (90 Base) MCG/ACT inhaler Inhale 2 puffs into the lungs every 6 (six) hours  as needed for wheezing or shortness of breath. Qty: 1 Inhaler, Refills: 0      STOP taking these medications     divalproex (DEPAKOTE ER) 250 MG 24 hr tablet      FLUoxetine (PROZAC) 10 MG capsule        Allergies  Allergen Reactions  . Magnesium-Containing Compounds Other (See Comments)    This medication is contraindicated with pts HIV meds.    .  Peanut-Containing Drug Products Anaphylaxis  . Esomeprazole Magnesium Cough  . Atripla [Efavirenz-Emtricitab-Tenofovir] Other (See Comments)    Reaction:  Suicidal thoughts   . Bactrim [Sulfamethoxazole-Trimethoprim] Rash  . Penicillins Rash and Other (See Comments)    Has patient had a PCN reaction causing immediate rash, facial/tongue/throat swelling, SOB or lightheadedness with hypotension: Yes Has patient had a PCN reaction causing severe rash involving mucus membranes or skin necrosis: No Has patient had a PCN reaction that required hospitalization No Has patient had a PCN reaction occurring within the last 10 years: No If all of the above answers are "NO", then may proceed with Cephalosporin use.      The results of significant diagnostics from this hospitalization (including imaging, microbiology, ancillary and laboratory) are listed below for reference.    Significant Diagnostic Studies: Dg Chest 2 View  Result Date: 03/06/2017 CLINICAL DATA:  31 year old male with history of suicidal ideation. Widespread vesicular rash. Possible shingles. EXAM: CHEST  2 VIEW COMPARISON:  No priors. FINDINGS: Lung volumes are normal. No consolidative airspace disease. No pleural effusions. No pneumothorax. No pulmonary nodule or mass noted. Pulmonary vasculature and the cardiomediastinal silhouette are within normal limits. IMPRESSION: No radiographic evidence of acute cardiopulmonary disease. Electronically Signed   By: Trudie Reed M.D.   On: 03/06/2017 17:30    Microbiology: Recent Results (from the past 240 hour(s))  Culture, blood (routine x 2)     Status: None   Collection Time: 03/07/17  4:20 AM  Result Value Ref Range Status   Specimen Description BLOOD LEFT ARM  Final   Special Requests BOTTLES DRAWN AEROBIC AND ANAEROBIC BCAV  Final   Culture   Final    NO GROWTH 5 DAYS Performed at [facility], [address] [place], [place] [address]    Report Status 03/12/2017 FINAL   Final  Culture, blood (routine x 2)     Status: None   Collection Time: 03/07/17  4:20 AM  Result Value Ref Range Status   Specimen Description BLOOD LEFT ARM  Final   Special Requests BOTTLES DRAWN AEROBIC AND ANAEROBIC BCAV  Final   Culture   Final    NO GROWTH 5 DAYS Performed at [facility], [address] [place], [place] [address]    Report Status 03/12/2017 FINAL  Final     Labs: Basic Metabolic Panel:  Recent Labs Lab 03/06/17 1707 03/07/17 0420 03/08/17 1117  NA 135 137 138  K 3.7 3.7 3.7  CL 99* 104 103  CO2 26 27 29   GLUCOSE 87 94 81  BUN 11 11 10   CREATININE 0.99 0.90 0.92  CALCIUM 8.9 8.6* 8.9   Liver Function Tests:  Recent Labs Lab 03/06/17 1707  AST 26  ALT 20  ALKPHOS 61  BILITOT 0.5  PROT 7.9  ALBUMIN 4.1   No results for input(s): LIPASE, AMYLASE in the last 168 hours. No results for input(s): AMMONIA in the last 168 hours. CBC:  Recent Labs Lab 03/06/17 1707 03/07/17 0420  WBC 6.5  5.8  NEUTROABS 3.2  --   HGB 13.9 13.8  HCT 41.3 41.5  MCV 82.6 82.7  PLT 205 191       Signed:  LAMA,GAGAN S MD.  [facility] 03/12/2017, 4:22 PM

## 2017-03-12 NOTE — Consult Note (Addendum)
[facility] Face-to-Face Psychiatry Consult   Reason for Consult:  Schizoaffective disorder, Suicide ideation and substance abuse vs intoxication Referring Physician:  Dr. Sharl Ma Patient Identification: Edwin Martinez MRN:  [id number] Principal Diagnosis: Herpes zoster Diagnosis:   Patient Active Problem List   Diagnosis Date Noted  . Fever [R50.9] 03/08/2017  . Herpes zoster [B02.9] 03/06/2017  . Polysubstance abuse [F19.10] 03/06/2017  . Homelessness [Z59.0] 03/06/2017  . Schizoaffective disorder, bipolar type (HCC) [F25.0] 12/23/2016  . Suicidal ideation [R45.851]   . Cocaine use disorder, mild, abuse [F14.10] 07/27/2016  . Cannabis use disorder, moderate, dependence (HCC) [F12.20] 07/27/2016  . Tobacco use disorder [F17.200] 07/27/2016  . Intentional drug overdose (HCC) [T50.902A] 07/18/2016  . Asthma [J45.909] 05/20/2007  . Human immunodeficiency virus (HIV) disease (HCC) [B20] 05/05/2007    Total Time spent with patient: 1 hour  Subjective:   Edwin Martinez is a 31 y.o. male patient admitted with depression with suicide ideation.  HPI:  [name] is an 31 y.o. male, seen, chart reviewed for this face-to-face psychiatric consultation and evaluation of schizoaffective disorder, depression, suicidal ideation and substance abuse with intoxication. Patient reported he has been homeless, living in shelters, here and there and has been noncompliant with medication management secondary to not able to keep up his appointments with the family service of [place] admitted to the hospital with depression and suicidal ideation. Patient also suffering with herpes zoster. Patient was admitted to the [facility] and started his psychotropic medication. Patient reported since he started taking his medication started feeling much better without depression, hallucinations, delusions, paranoia and denies current suicidal and homicidal ideation. Patient is willing to follow up with  outpatient psychiatrist at [facility] and also requesting 1 or 02 weeks of supply of the medication at the time of discharge. UDS is positive for cocaine, marijuana and amphetamines.  Past Psychiatric History: Schizoaffective disorder, HIV, polysubstance abuse and homeless and has multiple acute psychiatric hospitalization the past.  Risk to Self: Suicidal Ideation: Denied Suicidal Intent: NO Is patient at risk for suicide?: No Suicidal Plan?: Yes-Currently Present Specify Current Suicidal Plan: pt reports a plan to jump in front of a car Access to Means: Yes Specify Access to Suicidal Means: pt has access to traffic  What has been your use of drugs/alcohol within the last 12 months?: denies use, labs show positive for cocaine, cannabis, and Amphetamines on arrival to ED  How many times?: 5 Triggers for Past Attempts: Unpredictable, Hallucinations Intentional Self Injurious Behavior: None Risk to Others: Homicidal Ideation: No Thoughts of Harm to Others: No Current Homicidal Intent: No Current Homicidal Plan: No Access to Homicidal Means: No History of harm to others?: No Assessment of Violence: None Noted Does patient have access to weapons?: No Criminal Charges Pending?: No Does patient have a court date: No Prior Inpatient Therapy: Prior Inpatient Therapy: Yes Prior Therapy Dates: 2018, 2017 Prior Therapy Facilty/Provider(s): [facility], [facility] Reason for Treatment: SCHIZOAFFECTIVE  Prior Outpatient Therapy: Prior Outpatient Therapy: No Does patient have an ACCT team?: No Does patient have Intensive In-House Services?  : No Does patient have [name] services? : No Does patient have P4CC services?: No  Past Medical History:  Past Medical History:  Diagnosis Date  . ADHD (attention deficit hyperactivity disorder) 09/12/2012  . Anxiety   . Asthma   . Bipolar 1 disorder (HCC)   . HIV (human immunodeficiency virus infection) (HCC)   . Hypertension   . Schizophrenia  (HCC)   .  Seizures (HCC)     Past Surgical History:  Procedure Laterality Date  . DENTAL SURGERY     Family History:  Family History  Problem Relation Age of Onset  . Huntington's disease Father   . Heart disease Mother   . Suicidality Maternal Uncle   . Suicidality Maternal Grandmother    Family Psychiatric  History: Unknown  Social History:  History  Alcohol Use  . 1.2 oz/week  . 2 Standard drinks or equivalent per week    Comment: little recently     History  Drug Use  . Types: Cocaine, Marijuana    Comment: smoked marijuana 1-2 days ago    Social History   Social History  . Marital status: Single    Spouse name: N/A  . Number of children: N/A  . Years of education: N/A   Occupational History  . disability pending    Social History Main Topics  . Smoking status: Current Every Day Smoker    Packs/day: 0.50    Types: Cigarettes    Start date: 09/29/1991  . Smokeless tobacco: Never Used  . Alcohol use 1.2 oz/week    2 Standard drinks or equivalent per week     Comment: little recently  . Drug use: Yes    Types: Cocaine, Marijuana     Comment: smoked marijuana 1-2 days ago  . Sexual activity: Yes    Birth control/ protection: Condom   Other Topics Concern  . None   Social History Narrative   ** Merged History Encounter **       Additional Social History:    Allergies:   Allergies  Allergen Reactions  . Magnesium-Containing Compounds Other (See Comments)    This medication is contraindicated with pts HIV meds.    . Peanut-Containing Drug Products Anaphylaxis  . Esomeprazole Magnesium Cough  . Atripla [Efavirenz-Emtricitab-Tenofovir] Other (See Comments)    Reaction:  Suicidal thoughts   . Bactrim [Sulfamethoxazole-Trimethoprim] Rash  . Penicillins Rash and Other (See Comments)    Has patient had a PCN reaction causing immediate rash, facial/tongue/throat swelling, SOB or lightheadedness with hypotension: Yes Has patient had a PCN reaction  causing severe rash involving mucus membranes or skin necrosis: No Has patient had a PCN reaction that required hospitalization No Has patient had a PCN reaction occurring within the last 10 years: No If all of the above answers are "NO", then may proceed with Cephalosporin use.    Labs: No results found for this or any previous visit (from the past 48 hour(s)).  Current Facility-Administered Medications  Medication Dose Route Frequency Provider Last Rate Last Dose  . abacavir-dolutegravir-lamiVUDine (TRIUMEQ) 600-50-300 MG per tablet 1 tablet  1 tablet Oral Daily Cliffton Asters, MD   1 tablet at 03/12/17 1036  . acetaminophen (TYLENOL) tablet 650 mg  650 mg Oral Q6H PRN Jonah Blue, MD   650 mg at 03/07/17 2037   Or  . acetaminophen (TYLENOL) suppository 650 mg  650 mg Rectal Q6H PRN Jonah Blue, MD      . busPIRone (BUSPAR) tablet 5 mg  5 mg Oral BID Regalado, Belkys A, MD   5 mg at 03/12/17 1035  . citalopram (CELEXA) tablet 20 mg  20 mg Oral Daily Regalado, Belkys A, MD   20 mg at 03/12/17 1035  . clindamycin (CLEOCIN) capsule 300 mg  300 mg Oral Q8H Cliffton Asters, MD   300 mg at 03/12/17 1050  . docusate sodium (COLACE) capsule 100 mg  100 mg Oral  BID Jonah BlueYates, Jennifer, MD   100 mg at 03/11/17 1053  . enoxaparin (LOVENOX) injection 40 mg  40 mg Subcutaneous Q24H Regalado, Belkys A, MD   40 mg at 03/12/17 0012  . famotidine (PEPCID) tablet 20 mg  20 mg Oral Daily Opyd, Lavone Neriimothy S, MD   20 mg at 03/12/17 1036  . folic acid (FOLVITE) tablet 1 mg  1 mg Oral Daily Regalado, Belkys A, MD   1 mg at 03/12/17 1036  . gabapentin (NEURONTIN) capsule 100 mg  100 mg Oral TID Jonah BlueYates, Jennifer, MD   100 mg at 03/12/17 1035  . hydrOXYzine (ATARAX/VISTARIL) tablet 25-50 mg  25-50 mg Oral Q4H PRN Opyd, Lavone Neriimothy S, MD   50 mg at 03/08/17 1724  . nicotine (NICODERM CQ - dosed in mg/24 hr) patch 7 mg  7 mg Transdermal Daily Jonah BlueYates, Jennifer, MD   7 mg at 03/12/17 1037  . ondansetron (ZOFRAN) tablet 4 mg   4 mg Oral Q6H PRN Jonah BlueYates, Jennifer, MD       Or  . ondansetron [facility](ZOFRAN) injection 4 mg  4 mg Intravenous Q6H PRN Jonah BlueYates, Jennifer, MD      . thiamine (VITAMIN B-1) tablet 100 mg  100 mg Oral Daily Regalado, Belkys A, MD   100 mg at 03/12/17 1035  . traMADol (ULTRAM) tablet 50 mg  50 mg Oral Q6H PRN Opyd, Lavone Neriimothy S, MD   50 mg at 03/12/17 1050  . traZODone (DESYREL) tablet 50 mg  50 mg Oral QHS PRN Jonah BlueYates, Jennifer, MD   50 mg at 03/12/17 0013  . valACYclovir (VALTREX) tablet 1,000 mg  1,000 mg Oral TID Cliffton Astersampbell, John, MD   1,000 mg at 03/12/17 1036    Musculoskeletal: Strength & Muscle Tone: within normal limits Gait & Station: normal Patient leans: N/A  Psychiatric Specialty Exam: Physical Exam as per history and physical   ROS pain at affected area for herpes zoster and denied nausea, vomiting, abdomen pain, shortness of breath and chest pain No Fever-chills, No Headache, No changes with Vision or hearing, reports vertigo No problems swallowing food or Liquids, No Chest pain, Cough or Shortness of Breath, No Abdominal pain, No Nausea or Vommitting, Bowel movements are regular, No Blood in stool or Urine, No dysuria, No new skin rashes or bruises, No new joints pains-aches,  No new weakness, tingling, numbness in any extremity, No recent weight gain or loss, No polyuria, polydypsia or polyphagia,  A full 10 point Review of Systems was done, except as stated above, all other Review of Systems were negative.  Blood pressure 104/60, pulse (!) 56, temperature 97.4 F (36.3 C), temperature source Oral, resp. rate 18, height 5\' 7"  (1.702 m), weight 70.6 kg (155 lb 10.3 oz), SpO2 99 %.Body mass index is 24.38 kg/m.  General Appearance: Casual  Eye Contact:  Good  Speech:  Clear and Coherent  Volume:  Normal  Mood:  Euthymic  Affect:  Appropriate and Congruent  Thought Process:  Coherent and Goal Directed  Orientation:  Full (Time, Place, and Person)  Thought Content:  Logical   Suicidal Thoughts:  No  Homicidal Thoughts:  No  Memory:  Immediate;   Good Recent;   Good Remote;   Good  Judgement:  Intact  Insight:  Fair  Psychomotor Activity:  Normal  Concentration:  Concentration: Good and Attention Span: Good  Recall:  Good  Fund of Knowledge:  Good  Language:  Good  Akathisia:  Negative  Handed:  Right  AIMS (if  indicated):     Assets:  Communication Skills Desire for Improvement Financial Resources/Insurance Leisure Time Resilience Social Support Transportation  ADL's:  Intact  Cognition:  WNL  Sleep:        Treatment Plan Summary:  Daily contact with patient to assess and evaluate symptoms and progress in treatment and Medication management   Patient has no safety concerns, contract for safety during this evaluation Network engineer  Medication recommendation: Continue Celexa 20 mg daily and BuSpar 5 mg twice daily for anxiety Continue gabapentin 100 mg 3 times daily for neurotic pain Continue trazodone 50 mg bedtime for insomnia Restart home medication risperidone 1 mg twice daily for psychosis Appreciate psychiatric consultation and we sign off as of today Please contact [phone] or [phone] if needs further assistance    Disposition: Patient will be referred to the substance abuse rehabilitation treatment and medically stable. No evidence of imminent risk to self or others at present.   Patient does not meet criteria for psychiatric inpatient admission. Supportive therapy provided about ongoing stressors.  Leata Mouse, MD 03/12/2017 11:53 AM

## 2017-03-15 ENCOUNTER — Telehealth: Payer: Self-pay

## 2017-03-15 ENCOUNTER — Ambulatory Visit (INDEPENDENT_AMBULATORY_CARE_PROVIDER_SITE_OTHER): Payer: Self-pay | Admitting: Infectious Diseases

## 2017-03-15 ENCOUNTER — Encounter: Payer: Self-pay | Admitting: Infectious Diseases

## 2017-03-15 VITALS — BP 135/74 | HR 60 | Temp 98.9°F

## 2017-03-15 DIAGNOSIS — B20 Human immunodeficiency virus [HIV] disease: Secondary | ICD-10-CM

## 2017-03-15 DIAGNOSIS — F250 Schizoaffective disorder, bipolar type: Secondary | ICD-10-CM

## 2017-03-15 DIAGNOSIS — B029 Zoster without complications: Secondary | ICD-10-CM

## 2017-03-15 NOTE — Assessment & Plan Note (Signed)
All lesions appear to be crusted and dried upon inspection and palpation. No evidence of secondary bacterial infection. No need to extend valtrex or clindamycin at this time. I have counseled him to not pick/itch or pull at scabs on his neck as he is at a high risk of secondary infection (homeless, limited access to address hygiene, uncontrolled HIV infection). Provided with Allevyn foam/silicone dressings to prevent irritation from shirt collar and deter picking. Advised to obtain thick lotion to apply to crusted areas to help with itching.

## 2017-03-15 NOTE — Assessment & Plan Note (Signed)
No changes to regimen. Continue Triumeq and maintain follow up appt with [name] in July. Marthann SchillerMitch is aware of his appointment as well.

## 2017-03-15 NOTE — Assessment & Plan Note (Signed)
No changes at this time. Based on discussion with Marthann SchillerMitch he seems to be willing to work with him today.

## 2017-03-15 NOTE — Patient Instructions (Signed)
Keep the scabs on your neck/shoulder dry and covered during the day with the pink dressings we gave you.   Get some Eucerin cream (thick lotion) and apply it to these sites to help with the itching.

## 2017-03-15 NOTE — Telephone Encounter (Signed)
Patient walked into clinic with Edwin SarnaMitch Martinez to f/u with Case Management .  Follow up recommended by ED for shingles.   I was able to observe patients R shoulder/ upper chest  and neck  which still has open vesicles.  Patient has complaint of sharp shooting pains.  Valtrex was discontinued at discharge.  I will add her to schedule for today with Grant FontanaStepanie Dixon, NP.   Tomasita Morrowammy  King, RN

## 2017-03-15 NOTE — Progress Notes (Signed)
Patient Active Problem List   Diagnosis Date Noted  . Fever 03/08/2017  . Herpes zoster 03/06/2017  . Polysubstance abuse 03/06/2017  . Homelessness 03/06/2017  . Schizoaffective disorder, bipolar type (HCC) 12/23/2016  . Suicidal ideation   . Cocaine use disorder, mild, abuse 07/27/2016  . Cannabis use disorder, moderate, dependence (HCC) 07/27/2016  . Tobacco use disorder 07/27/2016  . Intentional drug overdose (HCC) 07/18/2016  . Asthma 05/20/2007  . Human immunodeficiency virus (HIV) disease (HCC) 05/05/2007    Patient's Medications  New Prescriptions   No medications on file  Previous Medications   ABACAVIR-DOLUTEGRAVIR-LAMIVUDINE (TRIUMEQ) 600-50-300 MG TABLET    Take 1 tablet by mouth daily.   ALBUTEROL (PROVENTIL HFA;VENTOLIN HFA) 108 (90 BASE) MCG/ACT INHALER    Inhale 2 puffs into the lungs every 6 (six) hours as needed for wheezing or shortness of breath.   BUSPIRONE (BUSPAR) 5 MG TABLET    Take 1 tablet (5 mg total) by mouth 2 (two) times daily.   CITALOPRAM (CELEXA) 20 MG TABLET    Take 1 tablet (20 mg total) by mouth daily.   GABAPENTIN (NEURONTIN) 100 MG CAPSULE    Take 1 capsule (100 mg total) by mouth 3 (three) times daily.   RISPERIDONE (RISPERDAL) 1 MG TABLET    Take 1 tablet (1 mg total) by mouth 2 (two) times daily.   TRAZODONE (DESYREL) 50 MG TABLET    Take 1 tablet (50 mg total) by mouth at bedtime as needed for sleep.  Modified Medications   No medications on file  Discontinued Medications   No medications on file    Subjective: Edwin Martinez presents to clinic today for hospital follow up.   Shingles = Hospitalization 6/9 - 6/15 for shingles with secondary bacterial infection. This is a new problem for him. He was initially seen in the ED 6/7 for a new rash that was originally thought to be allergic reaction from a new cologne. Went back later for second opinion and was found to have shingles over the right side of his neck and anterior  shoulder. He is uncertain as to when the rash started exactly - "maybe a few days prior to hospitalization". During his hospitalization he received Valtrex x 7d and clindamycin x 7d and was discharged with Neurontin. Today he describes only associated symptom to be itching and tingling where the rash is located. He has not picked up his Neurontin yet. He describes the sites to be very itchy and he has been picking off scabs on the back of his neck. Denies any drainage from wounds, fevers or chills.   HIV = 12/17/16 --> VL 231,000 and CD4 650. He is prescribed Triumeq. No associated symptoms of his condition aside from current outbreak of shingles.   Schizoaffective disorder = Reports to clinic accompanied by [facility] today. Reports he has been taking his psychiatric medications lately (recently hospitalized in March). Currently denies any suicidal thoughts or depressed mood.   Review of Systems: Review of Systems  Constitutional: Negative for chills and fever.  Eyes: Negative for blurred vision and pain.  Respiratory: Negative for cough.   Cardiovascular: Negative for chest pain and palpitations.  Gastrointestinal: Negative for nausea and vomiting.  Musculoskeletal: Negative for neck pain.  Skin: Positive for itching and rash.  Neurological: Positive for tingling. Negative for dizziness and headaches.  Psychiatric/Behavioral: Negative for hallucinations and suicidal ideas. The patient is not nervous/anxious.     Past Medical History:  Diagnosis  Date  . ADHD (attention deficit hyperactivity disorder) 09/12/2012  . Anxiety   . Asthma   . Bipolar 1 disorder (HCC)   . HIV (human immunodeficiency virus infection) (HCC)   . Hypertension   . Schizophrenia (HCC)   . Seizures (HCC)     Social History  Substance Use Topics  . Smoking status: Current Every Day Smoker    Packs/day: 0.50    Types: Cigarettes    Start date: 09/29/1991  . Smokeless tobacco: Never Used  . Alcohol use 1.2 oz/week     2 Standard drinks or equivalent per week     Comment: little recently    Family History  Problem Relation Age of Onset  . Huntington's disease Father   . Heart disease Mother   . Suicidality Maternal Uncle   . Suicidality Maternal Grandmother     Allergies  Allergen Reactions  . Magnesium-Containing Compounds Other (See Comments)    This medication is contraindicated with pts HIV meds.    . Peanut-Containing Drug Products Anaphylaxis  . Esomeprazole Magnesium Cough  . Atripla [Efavirenz-Emtricitab-Tenofovir] Other (See Comments)    Reaction:  Suicidal thoughts   . Bactrim [Sulfamethoxazole-Trimethoprim] Rash  . Penicillins Rash and Other (See Comments)    Has patient had a PCN reaction causing immediate rash, facial/tongue/throat swelling, SOB or lightheadedness with hypotension: Yes Has patient had a PCN reaction causing severe rash involving mucus membranes or skin necrosis: No Has patient had a PCN reaction that required hospitalization No Has patient had a PCN reaction occurring within the last 10 years: No If all of the above answers are "NO", then may proceed with Cephalosporin use.    Objective:  Vitals:   03/15/17 1434  BP: 135/74  Pulse: 60  Temp: 98.9 F (37.2 C)  TempSrc: Oral   There is no height or weight on file to calculate BMI.  Physical Exam  Constitutional: He is oriented to person, place, and time and well-developed, well-nourished, and in no distress.  Eyes: Right eye exhibits no discharge. Left eye exhibits no discharge. No scleral icterus.  Neck: Normal range of motion.  Cardiovascular: Normal rate, regular rhythm and normal heart sounds.   Pulmonary/Chest: Effort normal and breath sounds normal. No respiratory distress.  Lymphadenopathy:    He has no cervical adenopathy.  Neurological: He is alert and oriented to person, place, and time.  Skin: Skin is warm and dry. Rash noted.  Previously treated lesions are crusted and no vesicles observed.  He does have many scattered areas of ulcerations to posterior aspect of neck with yellow/red wound bed. No drainage.  Psychiatric: Mood and affect normal.   Lab Results Lab Results  Component Value Date   WBC 5.8 03/07/2017   HGB 13.8 03/07/2017   HCT 41.5 03/07/2017   MCV 82.7 03/07/2017   PLT 191 03/07/2017    Lab Results  Component Value Date   CREATININE 0.92 03/08/2017   BUN 10 03/08/2017   NA 138 03/08/2017   K 3.7 03/08/2017   CL 103 03/08/2017   CO2 29 03/08/2017    Lab Results  Component Value Date   ALT 20 03/06/2017   AST 26 03/06/2017   ALKPHOS 61 03/06/2017   BILITOT 0.5 03/06/2017    Lab Results  Component Value Date   CHOL 128 12/24/2016   HDL 35 (L) 12/24/2016   LDLCALC 67 12/24/2016   TRIG 130 12/24/2016   CHOLHDL 3.7 12/24/2016   HIV 1 RNA Quant (copies/mL)  Date Value  07/18/2016 100  09/11/2015 <20  02/05/2015 <20   CD4 T Cell Abs (/uL)  Date Value  03/07/2017 650  12/17/2016 1,160  07/18/2016 2,020   Lab Results  Component Value Date   HAV NEG 09/06/2012   Lab Results  Component Value Date   HEPBSAG NEGATIVE 02/21/2014   HEPBSAB REACTIVE (A) 09/06/2012   Lab Results  Component Value Date   HCVAB NEGATIVE 02/21/2014   Lab Results  Component Value Date   CHLAMYDIAWP Negative 03/07/2017   N Negative 03/07/2017     Problem List Items Addressed This Visit      Other   Schizoaffective disorder, bipolar type (HCC)    No changes at this time. Based on discussion with [name] he seems to be willing to work with him today.       Human immunodeficiency virus (HIV) disease (HCC)    No changes to regimen. Continue Triumeq and maintain follow up appt with Dr. [name] in July. [name] is aware of his appointment as well.       Herpes zoster - Primary    All lesions appear to be crusted and dried upon inspection and palpation. No evidence of secondary bacterial infection. No need to extend valtrex or clindamycin at this time. I have  counseled him to not pick/itch or pull at scabs on his neck as he is at a high risk of secondary infection (homeless, limited access to address hygiene, uncontrolled HIV infection). Provided with Allevyn foam/silicone dressings to prevent irritation from shirt collar and deter picking. Advised to obtain thick lotion to apply to crusted areas to help with itching.          Rexene Alberts, MSN, NP-C [facility] Cell: [phone] Pager: [phone]  03/15/17 3:57 PM

## 2017-03-21 LAB — HIV-1 RNA ULTRAQUANT REFLEX TO GENTYP+
HIV-1 RNA BY PCR: 5820 {copies}/mL
HIV-1 RNA QUANT, LOG: 3.765 {Log_copies}/mL

## 2017-03-30 ENCOUNTER — Encounter (HOSPITAL_COMMUNITY): Payer: Self-pay | Admitting: *Deleted

## 2017-03-30 ENCOUNTER — Emergency Department (HOSPITAL_COMMUNITY)
Admission: EM | Admit: 2017-03-30 | Discharge: 2017-03-31 | Disposition: A | Discharge status: Home / Self Care | Payer: Self-pay | Attending: Emergency Medicine | Admitting: Emergency Medicine

## 2017-03-30 DIAGNOSIS — F1414 Cocaine abuse with cocaine-induced mood disorder: Secondary | ICD-10-CM | POA: Diagnosis present

## 2017-03-30 DIAGNOSIS — R45851 Suicidal ideations: Secondary | ICD-10-CM | POA: Insufficient documentation

## 2017-03-30 DIAGNOSIS — I10 Essential (primary) hypertension: Secondary | ICD-10-CM | POA: Insufficient documentation

## 2017-03-30 DIAGNOSIS — F17210 Nicotine dependence, cigarettes, uncomplicated: Secondary | ICD-10-CM | POA: Insufficient documentation

## 2017-03-30 DIAGNOSIS — J45909 Unspecified asthma, uncomplicated: Secondary | ICD-10-CM | POA: Insufficient documentation

## 2017-03-30 LAB — ACETAMINOPHEN LEVEL: Acetaminophen (Tylenol), Serum: <10 ug/mL — ABNORMAL LOW (ref 10–30)

## 2017-03-30 LAB — COMPREHENSIVE METABOLIC PANEL
ALK PHOS: 72 U/L (ref 38–126)
ALT: 14 U/L — AB (ref 17–63)
AST: 19 U/L (ref 15–41)
Albumin: 3.6 g/dL (ref 3.5–5.0)
Anion gap: 9 (ref 5–15)
BUN: 7 mg/dL (ref 6–20)
CALCIUM: 8.8 mg/dL — AB (ref 8.9–10.3)
CO2: 22 mmol/L (ref 22–32)
CREATININE: 0.75 mg/dL (ref 0.61–1.24)
Chloride: 112 mmol/L — ABNORMAL HIGH (ref 101–111)
Glucose, Bld: 98 mg/dL (ref 65–99)
Potassium: 3.1 mmol/L — ABNORMAL LOW (ref 3.5–5.1)
Sodium: 143 mmol/L (ref 135–145)
TOTAL PROTEIN: 7.2 g/dL (ref 6.5–8.1)

## 2017-03-30 LAB — ETHANOL: ALCOHOL ETHYL (B): 7 mg/dL — AB (ref <5)

## 2017-03-30 LAB — CBC
HCT: 36 % — ABNORMAL LOW (ref 39.0–52.0)
HEMOGLOBIN: 12.1 g/dL — AB (ref 13.0–17.0)
MCH: 27.8 pg (ref 26.0–34.0)
MCHC: 33.6 g/dL (ref 30.0–36.0)
MCV: 82.6 fL (ref 78.0–100.0)
Platelets: 233 10*3/uL (ref 150–400)
RBC: 4.36 MIL/uL (ref 4.22–5.81)
RDW: 16.4 % — AB (ref 11.5–15.5)
WBC: 9.6 10*3/uL (ref 4.0–10.5)

## 2017-03-30 LAB — RAPID URINE DRUG SCREEN, HOSP PERFORMED
Amphetamines: NOT DETECTED
Barbiturates: NOT DETECTED
Benzodiazepines: NOT DETECTED
Cocaine: POSITIVE — AB
OPIATES: NOT DETECTED
TETRAHYDROCANNABINOL: POSITIVE — AB

## 2017-03-30 LAB — SALICYLATE LEVEL

## 2017-03-30 MED ORDER — CITALOPRAM HYDROBROMIDE 10 MG PO TABS
20.0000 mg | ORAL_TABLET | Freq: Every day | ORAL | Status: DC
Start: 2017-03-30 — End: 2017-03-31
  Administered 2017-03-30 – 2017-03-31 (×2): 20 mg via ORAL
  Filled 2017-03-30 (×2): qty 2

## 2017-03-30 MED ORDER — POTASSIUM CHLORIDE CRYS ER 20 MEQ PO TBCR
40.0000 meq | EXTENDED_RELEASE_TABLET | Freq: Once | ORAL | Status: AC
  Administered 2017-03-30: 40 meq via ORAL
  Filled 2017-03-30: qty 2

## 2017-03-30 MED ORDER — ABACAVIR-DOLUTEGRAVIR-LAMIVUD 600-50-300 MG PO TABS
1.0000 | ORAL_TABLET | Freq: Every day | ORAL | Status: DC
  Administered 2017-03-30 – 2017-03-31 (×2): 1 via ORAL
  Filled 2017-03-30 (×2): qty 1

## 2017-03-30 MED ORDER — ALBUTEROL SULFATE HFA 108 (90 BASE) MCG/ACT IN AERS: 2.0000 | INHALATION_SPRAY | Freq: Four times a day (QID) | RESPIRATORY_TRACT | Status: DC | PRN

## 2017-03-30 MED ORDER — RISPERIDONE 1 MG PO TABS
1.0000 mg | ORAL_TABLET | Freq: Two times a day (BID) | ORAL | Status: DC
  Administered 2017-03-30 – 2017-03-31 (×3): 1 mg via ORAL
  Filled 2017-03-30 (×3): qty 1

## 2017-03-30 MED ORDER — BUSPIRONE HCL 10 MG PO TABS
5.0000 mg | ORAL_TABLET | Freq: Two times a day (BID) | ORAL | Status: DC
  Administered 2017-03-30 – 2017-03-31 (×3): 5 mg via ORAL
  Filled 2017-03-30 (×3): qty 1

## 2017-03-30 MED ORDER — TRAZODONE HCL 50 MG PO TABS
50.0000 mg | ORAL_TABLET | Freq: Every evening | ORAL | Status: DC | PRN
Start: 2017-03-30 — End: 2017-03-31
  Administered 2017-03-30: 50 mg via ORAL
  Filled 2017-03-30: qty 1

## 2017-03-30 MED ORDER — GABAPENTIN 100 MG PO CAPS
100.0000 mg | ORAL_CAPSULE | Freq: Three times a day (TID) | ORAL | Status: DC
  Administered 2017-03-30 – 2017-03-31 (×4): 100 mg via ORAL
  Filled 2017-03-30 (×4): qty 1

## 2017-03-30 NOTE — BH Assessment (Signed)
TTS ordered for this patient. Writer made an attempt to see patient. However, patient was in the process of being taken to xray. Will see patient once she returns from xray.

## 2017-03-30 NOTE — ED Notes (Signed)
Bed: [id number] Expected date:  Expected time:  Means of arrival:  Comments: Hold triage 4

## 2017-03-30 NOTE — Progress Notes (Signed)
03/30/17 1339:  LRT introduced self to pt and offered activities, pt was sleep.  Caroll RancherMarjette Berdell Hostetler, LRT/CTRS

## 2017-03-30 NOTE — ED Notes (Signed)
Pt refused to allow staff to obtain vital signs. RN notified.

## 2017-03-30 NOTE — ED Notes (Signed)
Bed: [id number] Expected date:  Expected time:  Means of arrival:  Comments: 

## 2017-03-30 NOTE — ED Notes (Signed)
Unable to collect urine at this time.

## 2017-03-30 NOTE — ED Triage Notes (Addendum)
Pt reports suicidal ideations x 2 days, sts he is taking hei psych meds as prescribed, however he is hearing voices again telling him he is worthless, not deserving to live and that it will be best if he jump in front of the traffic pr jump off of the building. Pt sts he knows he needs inpatient treatment and he [facility], but is willing to go anywhere else. Pt also sts he needs help with crack cocaine addiction

## 2017-03-30 NOTE — BH Assessment (Addendum)
Assessment Note  Edwin Martinez is an 31 y.o. male with history of Schizophrenia, Bipolar I Disorder, Anxiety, aand ADHD. He presents to [facility] with suicidal thoughts. He has a plan to jump off a bridge [place]. He is unable to contract for safety. Patient has access to means which is accessibility to bridges. He has attempted suicide on several occasions in the past. He has tried to hang himself from a ceiling fan, overdosed on pills leading a hospital admissions, and jump in front of a car. He does not know what triggered his previous suicide attempts. He does not report any current stressors today. He does admit that he is increasingly depressed with symptoms of irritability, fatigue, loss of interest in usual pleasures, crying spells, etc. He denies a family history of mental health illness. No HI. He is currently facing criminal charges for larceny. He has a court date April 27, 2017. No AVH's reported. He does not appear to be responding to internal stimuli. Patient denies alcohol use. He does admit to daily cocaine use. He last used yesterday. He also regularly smokes THC. He denies alcohol use. Patient seeks outpatient treatment at [facility]. Sts that he is medication compliant but doesn't feel that his medications are working. He reports several prior INPT admissions at [facility], [facility], and [facility]. Patient is dressed in scrubs. He is malodorous. Speech is normal. Eye contact is minimal. He is oriented to time, person, place, and situation.    Diagnosis: Schizophrenia, Bipolar I Disorder, Anxiety, ADHD  Past Medical History:  Past Medical History:  Diagnosis Date  . ADHD (attention deficit hyperactivity disorder) 09/12/2012  . Anxiety   . Asthma   . Bipolar 1 disorder (HCC)   . HIV (human immunodeficiency virus infection) (HCC)   . Hypertension   . Schizophrenia (HCC)   . Seizures (HCC)     Past Surgical History:  Procedure Laterality Date  . DENTAL SURGERY       Family History:  Family History  Problem Relation Age of Onset  . Huntington's disease Father   . Heart disease Mother   . Suicidality Maternal Uncle   . Suicidality Maternal Grandmother     Social History:  reports that he has been smoking Cigarettes.  He started smoking about 25 years ago. He has been smoking about 0.50 packs per day. He has never used smokeless tobacco. He reports that he drinks about 1.2 oz of alcohol per week . He reports that he uses drugs, including Cocaine and Marijuana.  Social History:  reports that he has been smoking Cigarettes.  He started smoking about 25 years ago. He has been smoking about 0.50 packs per day. He has never used smokeless tobacco. He reports that he drinks about 1.2 oz of alcohol per week . He reports that he uses drugs, including Cocaine and Marijuana.  Additional Social History:  Alcohol / Drug Use Pain Medications: See MAR Prescriptions: See MAR Over the Counter: See MAR History of alcohol / drug use?: Yes Longest period of sobriety (when/how long): UNKNOWN Negative Consequences of Use: Legal, Personal relationships, Financial Substance #1 Name of Substance 1: Cocaine  1 - Age of First Use: 31 yrs old 1 - Amount (size/oz): "It's no way to measure use....just smoke it till you get high" 1 - Frequency: daily  1 - Duration: "several months" 1 - Last Use / Amount: 03/29/2017 Substance #2 Name of Substance 2: cannabis 2 - Age of First Use: 31 yrs old  2 - Amount (size/oz): varies 2 - Frequency: varies 2 - Duration: ongoing  2 - Last Use / Amount: 03/29/2017  CIWA: CIWA-Ar BP: 124/76 Pulse Rate: 72 COWS:    Allergies:  Allergies  Allergen Reactions  . Magnesium-Containing Compounds Other (See Comments)    This medication is contraindicated with pts HIV meds.    . Peanut-Containing Drug Products Anaphylaxis  . Esomeprazole Magnesium Cough  . Atripla [Efavirenz-Emtricitab-Tenofovir] Other (See Comments)    Reaction:   Suicidal thoughts   . Bactrim [Sulfamethoxazole-Trimethoprim] Rash  . Penicillins Rash and Other (See Comments)    Has patient had a PCN reaction causing immediate rash, facial/tongue/throat swelling, SOB or lightheadedness with hypotension: Yes Has patient had a PCN reaction causing severe rash involving mucus membranes or skin necrosis: No Has patient had a PCN reaction that required hospitalization No Has patient had a PCN reaction occurring within the last 10 years: No If all of the above answers are "NO", then may proceed with Cephalosporin use.    Home Medications:  (Not in a hospital admission)  OB/GYN Status:  No LMP for male patient.  General Assessment Data Location of Assessment: [facility] TTS Assessment: In system Is this a Tele or Face-to-Face Assessment?: Face-to-Face Is this an Initial Assessment or a Re-assessment for this encounter?: Initial Assessment Marital status: Single Maiden name:  (n/a) Is patient pregnant?: No Pregnancy Status: No Living Arrangements: Other (Comment) (homeless ) Can pt return to current living arrangement?: No Admission Status: Voluntary Is patient capable of signing voluntary admission?: Yes Referral Source: Self/Family/Friend Insurance type:  (Med Pay )     Crisis Care Plan Living Arrangements: Other (Comment) (homeless ) Legal Guardian: Other: (no legal guardian ) Name of Psychiatrist:  Museum/gallery curator([name] ) Name of Therapist:  Museum/gallery curator([name] )  Education Status Is patient currently in school?: No Current Grade:  (n/a) Highest grade of school patient has completed:  (8th grade ) Name of school:  (n/a) Contact person:  (n/a)  Risk to self with the past 6 months Suicidal Ideation: Yes-Currently Present Has patient been a risk to self within the past 6 months prior to admission? : Yes Suicidal Intent: Yes-Currently Present Has patient had any suicidal intent within the past 6 months prior to admission? : Yes Is patient at risk for suicide?:  Yes Suicidal Plan?: Yes-Currently Present Has patient had any suicidal plan within the past 6 months prior to admission? : Yes Specify Current Suicidal Plan:  ("Jump [place] bridge [place] Access to [employer]: Yes Specify Access to Suicidal Means:  (n/a) What has been your use of drugs/alcohol within the last 12 months?:  (cocaine ) Previous Attempts/Gestures: Yes How many times?:  (multiple; jump off a ) Other Self Harm Risks:  (no self harm ) Triggers for Past Attempts: Other (Comment) ("I don't know") Intentional Self Injurious Behavior: None Family Suicide History: No Recent stressful life event(s): Other (Comment) ("I don't know") Persecutory voices/beliefs?: No Depression: Yes Depression Symptoms: Feeling angry/irritable, Feeling worthless/self pity, Loss of interest in usual pleasures, Fatigue, Isolating Substance abuse history and/or treatment for substance abuse?: No Suicide prevention information given to non-admitted patients: Not applicable  Risk to Others within the past 6 months Homicidal Ideation: No Does patient have any lifetime risk of violence toward others beyond the six months prior to admission? : No Thoughts of Harm to Others: No Current Homicidal Intent: No Current Homicidal Plan: No Access to Homicidal Means: No Identified Victim:  (n/a) History of harm to others?: No  Assessment of Violence: None Noted Violent Behavior Description:  (patient is calm and cooperative ) Does patient have access to weapons?: No Criminal Charges Pending?: Yes Describe Pending Criminal Charges:  (larcerny) Does patient have a court date: Yes Court Date:  (July 13) Is patient on probation?: No  Psychosis Hallucinations: None noted Delusions: None noted  Mental Status Report Appearance/Hygiene: Disheveled Eye Contact: Good Motor Activity: Freedom of movement Speech: Logical/coherent Level of Consciousness: Alert Mood: Depressed Affect: Appropriate to  circumstance Anxiety Level: Minimal Thought Processes: Coherent, Relevant Judgement: Impaired Orientation: Person, Place, Time, Situation Obsessive Compulsive Thoughts/Behaviors: None  Cognitive Functioning Concentration: Decreased Memory: Recent Intact, Remote Intact IQ: Average Insight: Fair Impulse Control: Fair Appetite: Fair Weight Loss:  (none reported ) Weight Gain:  (none reported) Sleep: Decreased Total Hours of Sleep:  (varies) Vegetative Symptoms: None  ADLScreening [facility] Assessment Services) Patient's cognitive ability adequate to safely complete daily activities?: Yes Patient able to express need for assistance with ADLs?: Yes Independently performs ADLs?: Yes (appropriate for developmental age)  Prior Inpatient Therapy Prior Inpatient Therapy: Yes Prior Therapy Dates:  (multiple hospital admissions) Prior Therapy Facilty/Provider(s): [facility], [facility], and [facility]  Reason for Treatment:  (suicide attempts, depression, med managment )  Prior Outpatient Therapy Prior Outpatient Therapy: Yes Prior Therapy Dates:  (current ) Prior Therapy Facilty/Provider(s):  Museum/gallery curator ) Reason for Treatment:  (medication managment ) Does patient have an ACCT team?: No Does patient have Intensive In-House Services?  : No Does patient have [name] services? : No Does patient have P4[facility] services?: No  ADL Screening (condition at time of admission) Patient's cognitive ability adequate to safely complete daily activities?: Yes Is the patient deaf or have difficulty hearing?: No Does the patient have difficulty seeing, even when wearing glasses/contacts?: No Does the patient have difficulty concentrating, remembering, or making decisions?: No Patient able to express need for assistance with ADLs?: Yes Does the patient have difficulty dressing or bathing?: No Independently performs ADLs?: Yes (appropriate for developmental age) Does the patient have difficulty walking or  climbing stairs?: No Weakness of Legs: None Weakness of Arms/Hands: None  Home Assistive Devices/Equipment Home Assistive Devices/Equipment: None    Abuse/Neglect Assessment (Assessment to be complete while patient is alone) Physical Abuse: Denies Verbal Abuse: Denies Sexual Abuse: Denies Exploitation of patient/patient's resources: Denies Self-Neglect: Denies Values / Beliefs Cultural Requests During Hospitalization: None Spiritual Requests During Hospitalization: None   Advance Directives (For Healthcare) Does Patient Have a Medical Advance Directive?: No Would patient like information on creating a medical advance directive?: No - Patient declined Nutrition Screen- [facility] Adult/[facility] Patient's home diet: Regular  Additional Information 1:1 In Past 12 Months?: No CIRT Risk: No Elopement Risk: No Does patient have medical clearance?: No     Disposition: Per Dr. Jannifer Franklin and Nanine Means, DNP, patient meets criteria for overnight observation. Pending am psychiatric evaluation.  Disposition Initial Assessment Completed for this Encounter: Yes Disposition of Patient: Other dispositions (Per Dr. Cline Cools & Nanine Means, DNP, overnight observation) Other disposition(s): Other (Comment) (am psychiatric evaluation)  On Site Evaluation by:   Reviewed with Physician:       Melynda Ripple 03/30/2017 8:59 AM

## 2017-03-30 NOTE — ED Notes (Signed)
Pt admitted to room #39. Pt behavior cooperative, pt endorsing SI, verbally contracts for safety. Pt denies HI. Endorsing AH. Pt expressing hopelessness. Reports he is currently homeless. Denies hx of violence. Encouragement and support provided. Special checks q 15 mins in place for safety, Video monitoring in place. Will continue to monitor.

## 2017-03-30 NOTE — ED Provider Notes (Signed)
Emergency Department Provider Note   I have reviewed the triage vital signs and the nursing notes.   HISTORY  Chief Complaint Suicidal   HPI Edwin Martinez is a 31 y.o. male with PMH of schizophrenia, HIV, HTN, and asthma presents to the emergency room for evaluation of suicidal thinking for the past 2 days. Patient states he's had increasing auditory hallucinations which are telling him that he is worthless and trying to convince him to jump off a bridge. He denies any access to firearms. He uses crack cocaine occasionally with last use 48 hours ago. He is an occasional drinker with no history of alcohol withdrawal. He does use tobacco. Denies using other drugs. States he's been compliant with his psychiatry medications as well as his HIV medications. He has no medical complaints at this time   Past Medical History:  Diagnosis Date  . ADHD (attention deficit hyperactivity disorder) 09/12/2012  . Anxiety   . Asthma   . Bipolar 1 disorder (HCC)   . HIV (human immunodeficiency virus infection) (HCC)   . Hypertension   . Schizophrenia (HCC)   . Seizures [facility])     Patient Active Problem List   Diagnosis Date Noted  . Cocaine abuse with cocaine-induced mood disorder (HCC) 03/30/2017  . Herpes zoster 03/06/2017  . Polysubstance abuse 03/06/2017  . Homelessness 03/06/2017  . Schizoaffective disorder, bipolar type (HCC) 12/23/2016  . Suicidal ideation   . Cannabis use disorder, moderate, dependence (HCC) 07/27/2016  . Tobacco use disorder 07/27/2016  . Intentional drug overdose (HCC) 07/18/2016  . Asthma 05/20/2007  . Human immunodeficiency virus (HIV) disease (HCC) 05/05/2007    Past Surgical History:  Procedure Laterality Date  . DENTAL SURGERY      Current Outpatient Rx  . Order #: [id number] Class: No Print  . Order #: [id number] Class: Historical Med  . Order #: [id number] Class: Print  . Order #: [id number] Class: Print  . Order #: [id number] Class: Historical Med  .  Order #: [id number] Class: Print  . Order #: [id number] Class: Print  . Order #: [id number] Class: Historical Med  . Order #: [id number] Class: Historical Med  . Order #: [id number] Class: Print  . Order #: [id number] Class: Print    Allergies Magnesium-containing compounds; Peanut-containing drug products; Esomeprazole magnesium; Atripla [efavirenz-emtricitab-tenofovir]; Bactrim [sulfamethoxazole-trimethoprim]; and Penicillins  Family History  Problem Relation Age of Onset  . Huntington's disease Father   . Heart disease Mother   . Suicidality Maternal Uncle   . Suicidality Maternal Grandmother     Social History Social History  Substance Use Topics  . Smoking status: Current Every Day Smoker    Packs/day: 0.50    Types: Cigarettes    Start date: 09/29/1991  . Smokeless tobacco: Never Used  . Alcohol use 1.2 oz/week    2 Standard drinks or equivalent per week     Comment: little recently    Review of Systems  Constitutional: No fever/chills Eyes: No visual changes. ENT: No sore throat. Cardiovascular: Denies chest pain. Respiratory: Denies shortness of breath. Gastrointestinal: No abdominal pain.  No nausea, no vomiting.  No diarrhea.  No constipation. Genitourinary: Negative for dysuria. Musculoskeletal: Negative for back pain. Skin: Negative for rash. Neurological: Negative for headaches, focal weakness or numbness. Psychiatric:Positive suicidal thinking and auditory hallucinations.   10-point ROS otherwise negative.  ____________________________________________   PHYSICAL EXAM:  VITAL SIGNS: ED Triage Vitals [03/30/17 0607]  Enc Vitals Group     BP 128/73     Pulse Rate 76     Resp 18  Temp 98.5 F (36.9 C)     Temp Source Oral     SpO2 96 %  Constitutional: Alert and oriented. Well appearing and in no acute distress. Eyes: Conjunctivae are normal. Head: Atraumatic. Nose: No congestion/rhinnorhea. Mouth/Throat: Mucous membranes are moist.  Oropharynx  non-erythematous. Neck: No stridor.  Cardiovascular: Normal rate, regular rhythm. Good peripheral circulation. Grossly normal heart sounds.   Respiratory: Normal respiratory effort.  No retractions. Lungs CTAB. Gastrointestinal: Soft and nontender. No distention.  Musculoskeletal: No lower extremity tenderness nor edema. No gross deformities of extremities. Neurologic:  Normal speech and language. No gross focal neurologic deficits are appreciated.  Skin:  Skin is warm, dry and intact. No rash noted. Psychiatric: Mood and affect are normal. Speech and behavior are normal. Not responding to internal stimuli.   ____________________________________________   LABS (all labs ordered are listed, but only abnormal results are displayed)  Labs Reviewed  COMPREHENSIVE METABOLIC PANEL - Abnormal; Notable for the following:       Result Value   Potassium 3.1 (*)    Chloride 112 (*)    Calcium 8.8 (*)    ALT 14 (*)    Total Bilirubin <0.1 (*)    All other components within normal limits  ETHANOL - Abnormal; Notable for the following:    Alcohol, Ethyl (B) 7 (*)    All other components within normal limits  ACETAMINOPHEN LEVEL - Abnormal; Notable for the following:    Acetaminophen (Tylenol), Serum <10 (*)    All other components within normal limits  CBC - Abnormal; Notable for the following:    Hemoglobin 12.1 (*)    HCT 36.0 (*)    RDW 16.4 (*)    All other components within normal limits  RAPID URINE DRUG SCREEN, HOSP PERFORMED - Abnormal; Notable for the following:    Cocaine POSITIVE (*)    Tetrahydrocannabinol POSITIVE (*)    All other components within normal limits  SALICYLATE LEVEL   ____________________________________________   PROCEDURES  Procedure(s) performed:   Procedures  None ____________________________________________   INITIAL IMPRESSION / ASSESSMENT AND PLAN / ED COURSE  Pertinent labs & imaging results that were available during my care of the patient  were reviewed by me and considered in my medical decision making (see chart for details).  Patient presents to the emergency department for evaluation of worsening suicidal ideation setting of auditory hallucinations. Reports being compliant with his home medications. He does use crack cocaine and drinks alcohol occasionally. No history of complicated withdrawal. He has history of prior suicide attempts. No access to firearms. Patient is cooperative and polite during the interview and exam. I have reviewed his lab work drawn in triage. He is awaiting his urine tox but is medically cleared for psychiatry evaluation at this time. I've placed when necessary orders and restart his home medications including HIV medication. Placed TTS consultation.   Patient is medically clear for evaluation.   [facility] recommends AM Psych eval.  ____________________________________________  FINAL CLINICAL IMPRESSION(S) / ED DIAGNOSES  Final diagnoses:  Suicidal ideation     MEDICATIONS GIVEN DURING THIS VISIT:  Medications  abacavir-dolutegravir-lamiVUDine (TRIUMEQ) 600-50-300 MG per tablet 1 tablet (1 tablet Oral Given 03/30/17 0942)  albuterol (PROVENTIL HFA;VENTOLIN HFA) 108 (90 Base) MCG/ACT inhaler 2 puff (not administered)  busPIRone (BUSPAR) tablet 5 mg (5 mg Oral Given 03/30/17 0942)  citalopram (CELEXA) tablet 20 mg (20 mg Oral Given 03/30/17 0942)  gabapentin (NEURONTIN) capsule 100 mg (100 mg Oral Given 03/30/17 0942)  risperiDONE (RISPERDAL) tablet 1 mg (1 mg Oral Given 03/30/17 0942)  traZODone (DESYREL) tablet 50 mg (not administered)  potassium chloride SA (K-DUR,KLOR-CON) CR tablet 40 mEq (40 mEq Oral Given 03/30/17 0754)     NEW OUTPATIENT MEDICATIONS STARTED DURING THIS VISIT:  None  Note:  This document was prepared using Dragon voice recognition software and may include unintentional dictation errors.  Alona Bene, MD Emergency Medicine    Yenty Bloch, Arlyss Repress, MD 03/30/17 [phone]

## 2017-03-30 NOTE — ED Notes (Signed)
Patient resting in his bed with no signs or symptoms of distress at this time. Patient pleasant and cooperative with assessment. Pt denies suicidal ideations at this time. No needs or concerns expressed.

## 2017-03-30 NOTE — ED Notes (Signed)
Patient wanded by security. 

## 2017-03-31 DIAGNOSIS — F39 Unspecified mood [affective] disorder: Secondary | ICD-10-CM

## 2017-03-31 DIAGNOSIS — F17210 Nicotine dependence, cigarettes, uncomplicated: Secondary | ICD-10-CM

## 2017-03-31 DIAGNOSIS — F419 Anxiety disorder, unspecified: Secondary | ICD-10-CM

## 2017-03-31 DIAGNOSIS — F1290 Cannabis use, unspecified, uncomplicated: Secondary | ICD-10-CM

## 2017-03-31 DIAGNOSIS — F1910 Other psychoactive substance abuse, uncomplicated: Secondary | ICD-10-CM

## 2017-03-31 DIAGNOSIS — F1414 Cocaine abuse with cocaine-induced mood disorder: Secondary | ICD-10-CM

## 2017-03-31 MED ORDER — BUSPIRONE HCL 5 MG PO TABS: 10.0000 mg | ORAL_TABLET | Freq: Two times a day (BID) | ORAL | 0 refills | Status: DC

## 2017-03-31 MED ORDER — GABAPENTIN 100 MG PO CAPS: 100.0000 mg | ORAL_CAPSULE | Freq: Three times a day (TID) | ORAL | 0 refills | Status: DC

## 2017-03-31 MED ORDER — TRAZODONE HCL 50 MG PO TABS
50.0000 mg | ORAL_TABLET | Freq: Every evening | ORAL | 0 refills | Status: DC | PRN
Start: 2017-03-31 — End: 2017-08-12

## 2017-03-31 MED ORDER — CITALOPRAM HYDROBROMIDE 20 MG PO TABS: 20.0000 mg | ORAL_TABLET | Freq: Every day | ORAL | 0 refills | Status: DC

## 2017-03-31 MED ORDER — RISPERIDONE 1 MG PO TABS: 1.0000 mg | ORAL_TABLET | Freq: Two times a day (BID) | ORAL | 0 refills | Status: DC

## 2017-03-31 NOTE — [facility] Suicide Risk Assessment (Signed)
Suicide Risk Assessment  Discharge Assessment   [facility]   Principal Problem: Cocaine abuse with cocaine-induced mood disorder [facility](HCC) Discharge Diagnoses:  Patient Active Problem List   Diagnosis Date Noted  . Cocaine abuse with cocaine-induced mood disorder (HCC) [F14.14] 03/30/2017    Priority: High  . Herpes zoster [B02.9] 03/06/2017  . Polysubstance abuse [F19.10] 03/06/2017  . Homelessness [Z59.0] 03/06/2017  . Schizoaffective disorder, bipolar type (HCC) [F25.0] 12/23/2016  . Suicidal ideation [R45.851]   . Cannabis use disorder, moderate, dependence (HCC) [F12.20] 07/27/2016  . Tobacco use disorder [F17.200] 07/27/2016  . Intentional drug overdose (HCC) [T50.902A] 07/18/2016  . Asthma [J45.909] 05/20/2007  . Human immunodeficiency virus (HIV) disease (HCC) [B20] 05/05/2007    Total Time spent with patient: 45 minutes  Musculoskeletal: Strength & Muscle Tone: within normal limits Gait & Station: normal Patient leans: N/A  Psychiatric Specialty Exam: Physical Exam  Constitutional: He is oriented to person, place, and time. He appears well-developed and well-nourished.  HENT:  Head: Normocephalic.  Neck: Normal range of motion.  Respiratory: Effort normal.  Musculoskeletal: Normal range of motion.  Neurological: He is alert and oriented to person, place, and time.  Psychiatric: He has a normal mood and affect. His speech is normal and behavior is normal. Judgment and thought content normal. Cognition and memory are normal.    Review of Systems  Psychiatric/Behavioral: Positive for substance abuse.  All other systems reviewed and are negative.   Blood pressure 109/73, pulse 63, temperature 98.1 F (36.7 C), temperature source Oral, resp. rate 16, SpO2 99 %.There is no height or weight on file to calculate BMI.  General Appearance: Casual  Eye Contact:  Good  Speech:  Normal Rate  Volume:  Normal  Mood:  Euthymic  Affect:  Congruent   Thought Process:  Coherent and Descriptions of Associations: Intact  Orientation:  Full (Time, Place, and Person)  Thought Content:  WDL and Logical  Suicidal Thoughts:  No  Homicidal Thoughts:  No  Memory:  Immediate;   Good Recent;   Good Remote;   Good  Judgement:  Fair  Insight:  Fair  Psychomotor Activity:  Normal  Concentration:  Concentration: Good and Attention Span: Good  Recall:  Good  Fund of Knowledge:  Fair  Language:  Good  Akathisia:  No  Handed:  Right  AIMS (if indicated):     Assets:  Leisure Time Physical Health Resilience  ADL's:  Intact  Cognition:  WNL  Sleep:       Mental Status Per Nursing Assessment::   On Admission:  Cocaine abuse with suicidal ideations  Demographic Factors:  Male and Adolescent or young adult  Loss Factors: NA  Historical Factors: NA  Risk Reduction Factors:   Sense of responsibility to family, Positive social support and Positive therapeutic relationship  Continued Clinical Symptoms:  Anxiety, mild  Cognitive Features That Contribute To Risk:  None    Suicide Risk:  Minimal: No identifiable suicidal ideation.  Patients presenting with no risk factors but with morbid ruminations; may be classified as minimal risk based on the severity of the depressive symptoms    Plan Of Care/Follow-up recommendations:  Activity:  as tolerated Diet:  heart healthy diet  LORD, JAMISON, NP 03/31/2017, 10:36 AM

## 2017-03-31 NOTE — BH Assessment (Addendum)
[facility] Assessment Progress Note   Per Mojeed Akintayo, MD, this pt does not require psychiatric hospitalization at this time.  Pt is to be [facility] from [facility] with recommendation to follow up with [name].  This has been included in pt's discharge instructions.  Pt's nurse, Ashley, has been notified.  Yeray Tomas, MA Triage Specialist [phone]      

## 2017-03-31 NOTE — Discharge Instructions (Signed)
For your ongoing mental health needs, you are advised to follow up with [name].  New and returning patients are seen at their walk-in clinic.  Walk-in hours are Monday - Friday from 8:00 am - 3:00 pm.  Walk-in patients are seen on a first come, first served basis.  Try to arrive as early as possible for he best chance of being seen the same day: ° °     [name] °     [address] °     [place], [place] [address] °     [phone] °

## 2017-03-31 NOTE — Consult Note (Signed)
[facility] Consult   Reason for Consult:  Cocaine abuse with suicidal ideations Referring Physician:  EDP Patient Identification: Edwin Martinez MRN:  [id number] Principal Diagnosis: Cocaine abuse with cocaine-induced mood disorder [facility]) Diagnosis:   Patient Active Problem List   Diagnosis Date Noted  . Cocaine abuse with cocaine-induced mood disorder ([place]) [F14.14] 03/30/2017    Priority: High  . Herpes zoster [B02.9] 03/06/2017  . Polysubstance abuse [F19.10] 03/06/2017  . Homelessness [Z59.0] 03/06/2017  . Schizoaffective disorder, bipolar type ([place]) [F25.0] 12/23/2016  . Suicidal ideation [R45.851]   . Cannabis use disorder, moderate, dependence ([place]) [F12.20] 07/27/2016  . Tobacco use disorder [F17.200] 07/27/2016  . Intentional drug overdose ([place]) [T50.902A] 07/18/2016  . Asthma [J45.909] 05/20/2007  . Human immunodeficiency virus (HIV) disease ([place]) [B20] 05/05/2007    Total Time spent with patient: 45 minutes  Subjective:   Edwin Martinez is a 31 y.o. male patient does not warrant admission.  HPI:  31 yo male who was using cocaine and became suicidal.  He came to the ED and slept.  Today, he is sitting and eating breakfast.  Voices no suicidal/homicidal ideations, hallucinations, or withdrawal symptoms.  Goes to [facility] for his medications and care.  [place]ach[place] recently.  Stable for discharge.  Past Psychiatric History: depression, schizoaffective disorder, substance abuse  Risk to Self: Suicidal Ideation: Not Currently Suicidal Intent: Not Currently  Is patient at risk for suicide?: denies Suicidal Plan?: Not Currently  Specify Current Suicidal Plan:denies Access to Means: denies Specify Access to Suicidal Means:  (n/a) What has been your use of drugs/alcohol within the last 12 months?:  (cocaine ) How many times?:   Other Self Harm Risks:  (no self harm ) Triggers for Past Attempts: Other (Comment) ("I don't know") Intentional Self Injurious  Behavior: None Risk to Others: Homicidal Ideation: No Thoughts of Harm to Others: No Current Homicidal Intent: No Current Homicidal Plan: No Access to Homicidal Means: No Identified Victim:  (n/a) History of harm to others?: No Assessment of Violence: None Noted Violent Behavior Description:  (patient is calm and cooperative ) Does patient have access to weapons?: No Criminal Charges Pending?: Yes Describe Pending Criminal Charges:  (larcerny) Does patient have a court date: Yes Court Date:  (July 13) Prior Inpatient Therapy: Prior Inpatient Therapy: Yes Prior Therapy Dates:  (multiple hospital admissions) Prior Therapy Facilty/Provider(s): [facility], [facility], and [facility]  Reason for Treatment:  (suicide attempts, depression, med managment ) Prior Outpatient Therapy: Prior Outpatient Therapy: Yes Prior Therapy Dates:  (current ) Prior Therapy Facilty/Provider(s):  [facility]ulting civil [facility] ) Reason for Treatment:  (medication managment ) Does patient have an ACCT team?: No Does patient have Intensive In-House Services?  : No Does patient have [name] services? : No Does patient have P4CC services?: No  Past Medical History:  Past Medical History:  Diagnosis Date  . ADHD (attention deficit hyperactivity disorder) 09/12/2012  . Anxiety   . Asthma   . Bipolar 1 disorder ([place])   . HIV (human immunodeficiency virus infection) ([place])   . Hypertension   . Schizophrenia ([place])   . Seizures ([place])     Past Surgical History:  Procedure Laterality Date  . DENTAL SURGERY     Family History:  Family History  Problem Relation Age of Onset  . Huntington's disease Father   . Heart disease Mother   . Suicidality Maternal Uncle   . Suicidality Maternal Grandmother    Family Psychiatric  History: unknown Social History:  History  Alcohol Use  . 1.2 oz/week  . 2 Standard drinks or equivalent per week    Comment: little recently     History  Drug Use  . Types: Cocaine, Marijuana     Comment: smoked marijuana 1-2 days ago    Social History   Social History  . Marital status: Single    Spouse name: N/A  . Number of children: N/A  . Years of education: N/A   Occupational History  . disability pending    Social History Main Topics  . Smoking status: Current Every Day Smoker    Packs/day: 0.50    Types: Cigarettes    Start date: 09/29/1991  . Smokeless tobacco: Never Used  . Alcohol use 1.2 oz/week    2 Standard drinks or equivalent per week     Comment: little recently  . Drug use: Yes    Types: Cocaine, Marijuana     Comment: smoked marijuana 1-2 days ago  . Sexual activity: Yes    Birth control/ protection: Condom   Other Topics Concern  . None   Social History Narrative   ** Merged History Encounter **       Additional Social History:    Allergies:   Allergies  Allergen Reactions  . Magnesium-Containing Compounds Other (See Comments)    This medication is contraindicated with pts HIV meds.    . Peanut-Containing Drug Products Anaphylaxis  . Esomeprazole Magnesium Cough  . Atripla [Efavirenz-Emtricitab-Tenofovir] Other (See Comments)    Reaction:  Suicidal thoughts   . Bactrim [Sulfamethoxazole-Trimethoprim] Rash  . Penicillins Rash and Other (See Comments)    Has patient had a PCN reaction causing immediate rash, facial/tongue/throat swelling, SOB or lightheadedness with hypotension: Yes Has patient had a PCN reaction causing severe rash involving mucus membranes or skin necrosis: No Has patient had a PCN reaction that required hospitalization No Has patient had a PCN reaction occurring within the last 10 years: No If all of the above answers are "NO", then may proceed with Cephalosporin use.    Labs:  Results for orders placed or performed during the hospital encounter of 03/30/17 (from the past 48 hour(s))  Rapid urine drug screen (hospital performed)     Status: Abnormal   Collection Time: 03/30/17  6:26 AM  Result Value Ref Range    Opiates NONE DETECTED NONE DETECTED   Cocaine POSITIVE (A) NONE DETECTED   Benzodiazepines NONE DETECTED NONE DETECTED   Amphetamines NONE DETECTED NONE DETECTED   Tetrahydrocannabinol POSITIVE (A) NONE DETECTED   Barbiturates NONE DETECTED NONE DETECTED    Comment:        DRUG SCREEN FOR MEDICAL PURPOSES ONLY.  IF CONFIRMATION IS NEEDED FOR ANY PURPOSE, NOTIFY LAB WITHIN 5 DAYS.        LOWEST DETECTABLE LIMITS FOR URINE DRUG SCREEN Drug Class       Cutoff (ng/mL) Amphetamine      1000 Barbiturate      200 Benzodiazepine   349 Tricyclics       179 Opiates          300 Cocaine          300 THC              50   Comprehensive metabolic panel     Status: Abnormal   Collection Time: 03/30/17  6:36 AM  Result Value Ref Range   Sodium 143 135 - 145 mmol/L   Potassium 3.1 (L) 3.5 - 5.1 mmol/L   Chloride  112 (H) 101 - 111 mmol/L   CO2 22 22 - 32 mmol/L   Glucose, Bld 98 65 - 99 mg/dL   BUN 7 6 - 20 mg/dL   Creatinine, Ser 0.75 0.61 - 1.24 mg/dL   Calcium 8.8 (L) 8.9 - 10.3 mg/dL   Total Protein 7.2 6.5 - 8.1 g/dL   Albumin 3.6 3.5 - 5.0 g/dL   AST 19 15 - 41 U/L   ALT 14 (L) 17 - 63 U/L   Alkaline Phosphatase 72 38 - 126 U/L   Total Bilirubin <0.1 (L) 0.3 - 1.2 mg/dL    Comment: REPEATED TO VERIFY   GFR calc non Af Amer >60 >60 mL/min   GFR calc Af Amer >60 >60 mL/min    Comment: (NOTE) The eGFR has been calculated using the CKD EPI equation. This calculation has not been validated in all clinical situations. eGFR's persistently <60 mL/min signify possible Chronic Kidney Disease.    Anion gap 9 5 - 15  Ethanol     Status: Abnormal   Collection Time: 03/30/17  6:36 AM  Result Value Ref Range   Alcohol, Ethyl (B) 7 (H) <5 mg/dL    Comment:        LOWEST DETECTABLE LIMIT FOR SERUM ALCOHOL IS 5 mg/dL FOR MEDICAL PURPOSES ONLY   Salicylate level     Status: None   Collection Time: 03/30/17  6:36 AM  Result Value Ref Range   Salicylate Lvl <0.7 2.8 - 30.0 mg/dL   Acetaminophen level     Status: Abnormal   Collection Time: 03/30/17  6:36 AM  Result Value Ref Range   Acetaminophen (Tylenol), Serum <10 (L) 10 - 30 ug/mL    Comment:        THERAPEUTIC CONCENTRATIONS VARY SIGNIFICANTLY. A RANGE OF 10-30 ug/mL MAY BE AN EFFECTIVE CONCENTRATION FOR MANY PATIENTS. HOWEVER, SOME ARE BEST TREATED AT CONCENTRATIONS OUTSIDE THIS RANGE. ACETAMINOPHEN CONCENTRATIONS >150 ug/mL AT 4 HOURS AFTER INGESTION AND >50 ug/mL AT 12 HOURS AFTER INGESTION ARE OFTEN ASSOCIATED WITH TOXIC REACTIONS.   cbc     Status: Abnormal   Collection Time: 03/30/17  6:36 AM  Result Value Ref Range   WBC 9.6 4.0 - 10.5 K/uL   RBC 4.36 4.22 - 5.81 MIL/uL   Hemoglobin 12.1 (L) 13.0 - 17.0 g/dL   HCT 36.0 (L) 39.0 - 52.0 %   MCV 82.6 78.0 - 100.0 fL   MCH 27.8 26.0 - 34.0 pg   MCHC 33.6 30.0 - 36.0 g/dL   RDW 16.4 (H) 11.5 - 15.5 %   Platelets 233 150 - 400 K/uL    Current Facility-Administered Medications  Medication Dose Route Frequency Provider Last Rate Last Dose  . abacavir-dolutegravir-lamiVUDine (TRIUMEQ) [phone] MG per tablet 1 tablet  1 tablet Oral Daily Long, Wonda Olds, MD   1 tablet at 03/31/17 0911  . albuterol (PROVENTIL HFA;VENTOLIN HFA) 108 (90 Base) MCG/ACT inhaler 2 puff  2 puff Inhalation Q6H PRN Long, Wonda Olds, MD      . busPIRone (BUSPAR) tablet 5 mg  5 mg Oral BID Long, Wonda Olds, MD   5 mg at 03/31/17 0912  . citalopram (CELEXA) tablet 20 mg  20 mg Oral Daily Long, Wonda Olds, MD   20 mg at 03/31/17 0912  . gabapentin (NEURONTIN) capsule 100 mg  100 mg Oral TID Margette Fast, MD   100 mg at 03/31/17 0912  . risperiDONE (RISPERDAL) tablet 1 mg  1 mg Oral BID Long,  Wonda Olds, MD   1 mg at 03/31/17 0911  . traZODone (DESYREL) tablet 50 mg  50 mg Oral QHS PRN Long, Wonda Olds, MD   50 mg at 03/30/17 2148   Current Outpatient Prescriptions  Medication Sig Dispense Refill  . abacavir-dolutegravir-lamiVUDine (TRIUMEQ) 600-50-300 MG tablet Take 1 tablet by  mouth daily. 30 tablet   . acetaminophen (TYLENOL) 650 MG CR tablet Take 650 mg by mouth every 8 (eight) hours as needed for pain.    Marland Kitchen albuterol (PROVENTIL HFA;VENTOLIN HFA) 108 (90 Base) MCG/ACT inhaler Inhale 2 puffs into the lungs every 6 (six) hours as needed for wheezing or shortness of breath. 1 Inhaler 0  . busPIRone (BUSPAR) 5 MG tablet Take 1 tablet (5 mg total) by mouth 2 (two) times daily. 8 tablet 0  . cetirizine (ZYRTEC) 10 MG tablet Take 10 mg by mouth daily as needed for allergies.    . citalopram (CELEXA) 20 MG tablet Take 1 tablet (20 mg total) by mouth daily. 4 tablet 0  . gabapentin (NEURONTIN) 100 MG capsule Take 1 capsule (100 mg total) by mouth 3 (three) times daily. 24 capsule 0  . hydrocortisone cream 1 % Apply 1 application topically 2 (two) times daily as needed for itching.    Marland Kitchen ibuprofen (ADVIL,MOTRIN) 200 MG tablet Take 400 mg by mouth every 6 (six) hours as needed for fever, headache, mild pain or moderate pain.    Marland Kitchen risperiDONE (RISPERDAL) 1 MG tablet Take 1 tablet (1 mg total) by mouth 2 (two) times daily. 8 tablet 0  . traZODone (DESYREL) 50 MG tablet Take 1 tablet (50 mg total) by mouth at bedtime as needed for sleep. 4 tablet 0    Musculoskeletal: Strength & Muscle Tone: within normal limits Gait & Station: normal Patient leans: N/A  Psychiatric Specialty Exam: Physical Exam  Constitutional: He is oriented to person, place, and time. He appears well-developed and well-nourished.  HENT:  Head: Normocephalic.  Neck: Normal range of motion.  Respiratory: Effort normal.  Musculoskeletal: Normal range of motion.  Neurological: He is alert and oriented to person, place, and time.  Psychiatric: He has a normal mood and affect. His speech is normal and behavior is normal. Judgment and thought content normal. Cognition and memory are normal.    Review of Systems  Psychiatric/Behavioral: Positive for substance abuse.  All other systems reviewed and are  negative.   Blood pressure 109/73, pulse 63, temperature 98.1 F (36.7 C), temperature source Oral, resp. rate 16, SpO2 99 %.There is no height or weight on file to calculate BMI.  General Appearance: Casual  Eye Contact:  Good  Speech:  Normal Rate  Volume:  Normal  Mood:  Euthymic  Affect:  Congruent  Thought Process:  Coherent and Descriptions of Associations: Intact  Orientation:  Full (Time, Place, and Person)  Thought Content:  WDL and Logical  Suicidal Thoughts:  No  Homicidal Thoughts:  No  Memory:  Immediate;   Good Recent;   Good Remote;   Good  Judgement:  Fair  Insight:  Fair  Psychomotor Activity:  Normal  Concentration:  Concentration: Good and Attention Span: Good  Recall:  Good  Fund of Knowledge:  Fair  Language:  Good  Akathisia:  No  Handed:  Right  AIMS (if indicated):     Assets:  Leisure Time Physical Health Resilience  ADL's:  Intact  Cognition:  WNL  Sleep:        Treatment Plan Summary: Daily contact  with patient to assess and evaluate symptoms and progress in treatment, Medication management and Plan cocaine abuse with cocaine induced mood disorder:  -Crisis stabilization -Medication management:  Started Risperdal 1 mg BID for mood stabilization, Gabapentin 100 mg TID for mood and cocaine abuse, Buspar 5 mg increased to 10 mg BID for anxiety, Trazodone 50 mg at bedtime PRN sleep, and Celexa 20 mg daily for depression along with medical medications -Individual and substance abuse counseling  Disposition: No evidence of imminent risk to self or others at present.    Waylan Boga, NP 03/31/2017 10:26 AM

## 2017-03-31 NOTE — ED Notes (Addendum)
Pt d/c home per MD order. Discharge summary reviewed with pt. Pt verbalizes understanding. RX provided. Pt denies SI/HI/AVH. Pt signed for personal property and property returned. Bus pass provided per pt request. Pt signed e-signature. Ambulatory off unit with MHT.

## 2017-04-05 ENCOUNTER — Ambulatory Visit: Payer: Self-pay | Admitting: Internal Medicine

## 2017-04-06 ENCOUNTER — Other Ambulatory Visit: Payer: Self-pay

## 2017-04-06 MED ORDER — ABACAVIR-DOLUTEGRAVIR-LAMIVUD 600-50-300 MG PO TABS: 1.0000 | ORAL_TABLET | Freq: Every day | ORAL | 3 refills | Status: DC

## 2017-04-08 ENCOUNTER — Other Ambulatory Visit: Payer: Self-pay | Admitting: *Deleted

## 2017-04-08 DIAGNOSIS — B20 Human immunodeficiency virus [HIV] disease: Secondary | ICD-10-CM

## 2017-04-08 MED ORDER — ABACAVIR-DOLUTEGRAVIR-LAMIVUD 600-50-300 MG PO TABS: 1.0000 | ORAL_TABLET | Freq: Every day | ORAL | 3 refills | Status: DC

## 2017-05-11 ENCOUNTER — Ambulatory Visit: Payer: Self-pay | Admitting: Internal Medicine

## 2017-07-13 LAB — REFLEX TO GENOSURE(R) MG

## 2017-07-20 ENCOUNTER — Ambulatory Visit (INDEPENDENT_AMBULATORY_CARE_PROVIDER_SITE_OTHER): Payer: Self-pay | Admitting: Pharmacist Clinician (PhC)/ Clinical Pharmacy Specialist

## 2017-07-20 DIAGNOSIS — Z23 Encounter for immunization: Secondary | ICD-10-CM

## 2017-07-20 DIAGNOSIS — B20 Human immunodeficiency virus [HIV] disease: Secondary | ICD-10-CM

## 2017-07-20 LAB — GLUCOSE, POCT (MANUAL RESULT ENTRY): POC Glucose: 91 mg/dl (ref 70–99)

## 2017-07-20 MED ORDER — BICTEGRAVIR-EMTRICITAB-TENOFOV 50-200-25 MG PO TABS: 1.0000 | ORAL_TABLET | Freq: Every day | ORAL | 2 refills | Status: DC
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
## 2017-08-04 ENCOUNTER — Other Ambulatory Visit: Payer: Self-pay

## 2017-08-04 ENCOUNTER — Emergency Department (HOSPITAL_COMMUNITY)
Admission: EM | Admit: 2017-08-04 | Discharge: 2017-08-04 | Disposition: A | Discharge status: Other Acute Inpatient Hospital | Payer: Self-pay | Attending: Emergency Medicine | Admitting: Emergency Medicine

## 2017-08-04 ENCOUNTER — Inpatient Hospital Stay
Admission: AD | Admit: 2017-08-04 | Discharge: 2017-08-12 | DRG: 885 | Disposition: A | Discharge status: Home / Self Care | Payer: No Typology Code available for payment source | Source: Intra-hospital | Attending: Psychiatry | Admitting: Psychiatry

## 2017-08-04 ENCOUNTER — Encounter (HOSPITAL_COMMUNITY): Payer: Self-pay | Admitting: Emergency Medicine

## 2017-08-04 DIAGNOSIS — I10 Essential (primary) hypertension: Secondary | ICD-10-CM | POA: Diagnosis present

## 2017-08-04 DIAGNOSIS — F1910 Other psychoactive substance abuse, uncomplicated: Secondary | ICD-10-CM

## 2017-08-04 DIAGNOSIS — F909 Attention-deficit hyperactivity disorder, unspecified type: Secondary | ICD-10-CM | POA: Diagnosis present

## 2017-08-04 DIAGNOSIS — F419 Anxiety disorder, unspecified: Secondary | ICD-10-CM | POA: Diagnosis present

## 2017-08-04 DIAGNOSIS — F17210 Nicotine dependence, cigarettes, uncomplicated: Secondary | ICD-10-CM | POA: Insufficient documentation

## 2017-08-04 DIAGNOSIS — Z5900 Homelessness unspecified: Secondary | ICD-10-CM

## 2017-08-04 DIAGNOSIS — F1414 Cocaine abuse with cocaine-induced mood disorder: Secondary | ICD-10-CM | POA: Diagnosis present

## 2017-08-04 DIAGNOSIS — G8929 Other chronic pain: Secondary | ICD-10-CM | POA: Diagnosis present

## 2017-08-04 DIAGNOSIS — Z9119 Patient's noncompliance with other medical treatment and regimen: Secondary | ICD-10-CM

## 2017-08-04 DIAGNOSIS — F1220 Cannabis dependence, uncomplicated: Secondary | ICD-10-CM | POA: Diagnosis not present

## 2017-08-04 DIAGNOSIS — R45851 Suicidal ideations: Secondary | ICD-10-CM | POA: Diagnosis present

## 2017-08-04 DIAGNOSIS — B20 Human immunodeficiency virus [HIV] disease: Secondary | ICD-10-CM | POA: Diagnosis present

## 2017-08-04 DIAGNOSIS — Z818 Family history of other mental and behavioral disorders: Secondary | ICD-10-CM | POA: Diagnosis not present

## 2017-08-04 DIAGNOSIS — G4700 Insomnia, unspecified: Secondary | ICD-10-CM | POA: Diagnosis present

## 2017-08-04 DIAGNOSIS — Z915 Personal history of self-harm: Secondary | ICD-10-CM

## 2017-08-04 DIAGNOSIS — F259 Schizoaffective disorder, unspecified: Secondary | ICD-10-CM | POA: Diagnosis present

## 2017-08-04 DIAGNOSIS — R945 Abnormal results of liver function studies: Secondary | ICD-10-CM

## 2017-08-04 DIAGNOSIS — F1010 Alcohol abuse, uncomplicated: Secondary | ICD-10-CM | POA: Diagnosis present

## 2017-08-04 DIAGNOSIS — Z79899 Other long term (current) drug therapy: Secondary | ICD-10-CM | POA: Diagnosis not present

## 2017-08-04 DIAGNOSIS — Z720 Tobacco use: Secondary | ICD-10-CM | POA: Diagnosis present

## 2017-08-04 DIAGNOSIS — R7989 Other specified abnormal findings of blood chemistry: Secondary | ICD-10-CM | POA: Diagnosis present

## 2017-08-04 DIAGNOSIS — J45909 Unspecified asthma, uncomplicated: Secondary | ICD-10-CM | POA: Insufficient documentation

## 2017-08-04 DIAGNOSIS — F319 Bipolar disorder, unspecified: Secondary | ICD-10-CM | POA: Insufficient documentation

## 2017-08-04 DIAGNOSIS — F333 Major depressive disorder, recurrent, severe with psychotic symptoms: Secondary | ICD-10-CM | POA: Insufficient documentation

## 2017-08-04 DIAGNOSIS — F250 Schizoaffective disorder, bipolar type: Principal | ICD-10-CM | POA: Diagnosis present

## 2017-08-04 DIAGNOSIS — Z21 Asymptomatic human immunodeficiency virus [HIV] infection status: Secondary | ICD-10-CM

## 2017-08-04 DIAGNOSIS — Z91010 Allergy to peanuts: Secondary | ICD-10-CM | POA: Insufficient documentation

## 2017-08-04 LAB — COMPREHENSIVE METABOLIC PANEL
ALT: 185 U/L — AB (ref 17–63)
AST: 102 U/L — AB (ref 15–41)
Albumin: 3.9 g/dL (ref 3.5–5.0)
Alkaline Phosphatase: 84 U/L (ref 38–126)
Anion gap: 8 (ref 5–15)
BUN: 5 mg/dL — AB (ref 6–20)
CO2: 23 mmol/L (ref 22–32)
CREATININE: 0.83 mg/dL (ref 0.61–1.24)
Calcium: 9.2 mg/dL (ref 8.9–10.3)
Chloride: 107 mmol/L (ref 101–111)
GFR calc Af Amer: >60 mL/min (ref >60)
GFR calc non Af Amer: >60 mL/min (ref >60)
Glucose, Bld: 100 mg/dL — ABNORMAL HIGH (ref 65–99)
Potassium: 3.7 mmol/L (ref 3.5–5.1)
SODIUM: 138 mmol/L (ref 135–145)
Total Bilirubin: 0.6 mg/dL (ref 0.3–1.2)
Total Protein: 7.2 g/dL (ref 6.5–8.1)

## 2017-08-04 LAB — RAPID URINE DRUG SCREEN, HOSP PERFORMED
AMPHETAMINES: POSITIVE — AB
Barbiturates: NOT DETECTED
Benzodiazepines: NOT DETECTED
Cocaine: POSITIVE — AB
OPIATES: NOT DETECTED
TETRAHYDROCANNABINOL: POSITIVE — AB

## 2017-08-04 LAB — CBC
HCT: 43.4 % (ref 39.0–52.0)
HEMOGLOBIN: 14.3 g/dL (ref 13.0–17.0)
MCH: 27.4 pg (ref 26.0–34.0)
MCHC: 32.9 g/dL (ref 30.0–36.0)
MCV: 83.1 fL (ref 78.0–100.0)
Platelets: 270 10*3/uL (ref 150–400)
RBC: 5.22 MIL/uL (ref 4.22–5.81)
RDW: 14.5 % (ref 11.5–15.5)
WBC: 8.7 10*3/uL (ref 4.0–10.5)

## 2017-08-04 LAB — ACETAMINOPHEN LEVEL: Acetaminophen (Tylenol), Serum: <10 ug/mL — ABNORMAL LOW (ref 10–30)

## 2017-08-04 LAB — SALICYLATE LEVEL: Salicylate Lvl: <7 mg/dL (ref 2.8–30.0)

## 2017-08-04 LAB — ETHANOL: Alcohol, Ethyl (B): <10 mg/dL (ref <10)

## 2017-08-04 MED ORDER — ACETAMINOPHEN 325 MG PO TABS: 650.0000 mg | ORAL_TABLET | Freq: Four times a day (QID) | ORAL | Status: DC | PRN

## 2017-08-04 MED ORDER — IPRATROPIUM BROMIDE HFA 17 MCG/ACT IN AERS
2.0000 | INHALATION_SPRAY | Freq: Once | RESPIRATORY_TRACT | Status: AC
  Administered 2017-08-04: 2 via RESPIRATORY_TRACT
  Filled 2017-08-04: qty 12.9

## 2017-08-04 MED ORDER — RISPERIDONE 1 MG PO TABS
2.0000 mg | ORAL_TABLET | Freq: Two times a day (BID) | ORAL | Status: DC
  Administered 2017-08-04 – 2017-08-05 (×2): 2 mg via ORAL
  Filled 2017-08-04 (×2): qty 2

## 2017-08-04 MED ORDER — DIVALPROEX SODIUM 500 MG PO DR TAB
500.0000 mg | DELAYED_RELEASE_TABLET | Freq: Three times a day (TID) | ORAL | Status: DC
  Administered 2017-08-04 – 2017-08-07 (×8): 500 mg via ORAL
  Filled 2017-08-04 (×8): qty 1

## 2017-08-04 MED ORDER — ALBUTEROL SULFATE HFA 108 (90 BASE) MCG/ACT IN AERS
2.0000 | INHALATION_SPRAY | Freq: Four times a day (QID) | RESPIRATORY_TRACT | Status: DC | PRN
  Administered 2017-08-04: 2 via RESPIRATORY_TRACT

## 2017-08-04 MED ORDER — HYDROXYZINE HCL 50 MG PO TABS
50.0000 mg | ORAL_TABLET | Freq: Three times a day (TID) | ORAL | Status: DC | PRN
  Administered 2017-08-09 – 2017-08-10 (×2): 50 mg via ORAL
  Filled 2017-08-04 (×2): qty 1

## 2017-08-04 MED ORDER — BICTEGRAVIR-EMTRICITAB-TENOFOV 50-200-25 MG PO TABS
1.0000 | ORAL_TABLET | Freq: Every day | ORAL | Status: DC
  Administered 2017-08-04: 1 via ORAL
  Filled 2017-08-04: qty 1

## 2017-08-04 MED ORDER — TRAZODONE HCL 100 MG PO TABS
100.0000 mg | ORAL_TABLET | Freq: Every day | ORAL | Status: DC
Start: 2017-08-04 — End: 2017-08-12
  Administered 2017-08-05 – 2017-08-11 (×5): 100 mg via ORAL
  Filled 2017-08-04 (×5): qty 1

## 2017-08-04 MED ORDER — NICOTINE 21 MG/24HR TD PT24
21.0000 mg | MEDICATED_PATCH | Freq: Every day | TRANSDERMAL | Status: DC
  Filled 2017-08-04 (×2): qty 1

## 2017-08-04 NOTE — BH Assessment (Signed)
[facility] Assessment Progress Note  Case discussed with Donell SievertSpencer Simon, PA who recommends inpt treatment. [facility] TTS advised the pt's nurse Tori, RN and EDP Horton, Mayer Maskerourtney F, MD of the recommendation.   Princess BruinsAquicha Henreitta Spittler, MSW, LCSW Therapeutic Triage Specialist  [phone]

## 2017-08-04 NOTE — Tx Team (Signed)
Initial Treatment Plan 08/04/2017 4:23 PM [name] A Sur [id number]    PATIENT STRESSORS: Financial difficulties Medication change or noncompliance Substance abuse   PATIENT STRENGTHS: Ability for insight Active sense of humor Average or above average intelligence Capable of independent living Communication skills   PATIENT IDENTIFIED PROBLEMS: "I want to get back on my meds".  Financial difficulties.  Homelessness.  Establish support system.               DISCHARGE CRITERIA:  Improved stabilization in mood, thinking, and/or behavior Verbal commitment to aftercare and medication compliance  PRELIMINARY DISCHARGE PLAN: Outpatient therapy  PATIENT/FAMILY INVOLVEMENT: This treatment plan has been presented to and reviewed with the patient, [name] A Cottam, and/or family member.  The patient and family have been given the opportunity to ask questions and make suggestions.  Elige RadonCobb, Lailoni Baquera B, RN 08/04/2017, 4:23 PM

## 2017-08-04 NOTE — Progress Notes (Signed)
[facility]  Accepting and attending physician is Dr. Jennet MaduroPucilowska.  Patient assigned to room 322. Call report to [phone] Voluntary Consent to be faxed to [phone]  Griffith CitronStacy Woody, RN notified.   Baldo DaubJolan Kenton Fortin MSW, LCSWA CSW Disposition [phone]

## 2017-08-04 NOTE — ED Notes (Signed)
Per sitter, pt is requesting cough syrup.

## 2017-08-04 NOTE — ED Provider Notes (Signed)
[facility] Provider Note   CSN: [id number] Arrival date & time: 08/04/17  0434     History   Chief Complaint Chief Complaint  Patient presents with  . Suicidal    HPI Edwin Martinez is a 31 y.o. male.  HPI  A 31 year old male with a history of schizophrenia, bipolar disorder, HIV who presents with suicidal ideation.  Patient reports that he has recently been in jail.  He is off of his medication and has been for 1 month.  He reports suicidal ideation.  He states that he tried to jump in front of a car this evening.  He previously states that he has tried to overdose.  He does report hearing voices.  Denies delusions.  He reports both alcohol and drug use earlier today.  He denies any physical complaints with the exception of a cough.  No recent fevers, chest pain, shortness of breath, nausea, vomiting.  Past Medical History:  Diagnosis Date  . ADHD (attention deficit hyperactivity disorder) 09/12/2012  . Anxiety   . Asthma   . Bipolar 1 disorder (HCC)   . HIV (human immunodeficiency virus infection) (HCC)   . Hypertension   . Schizophrenia (HCC)   . Seizures [facility](HCC)     Patient Active Problem List   Diagnosis Date Noted  . Cocaine abuse with cocaine-induced mood disorder (HCC) 03/30/2017  . Herpes zoster 03/06/2017  . Polysubstance abuse (HCC) 03/06/2017  . Homelessness 03/06/2017  . Schizoaffective disorder, bipolar type (HCC) 12/23/2016  . Suicidal ideation   . Cannabis use disorder, moderate, dependence (HCC) 07/27/2016  . Tobacco use disorder 07/27/2016  . Intentional drug overdose (HCC) 07/18/2016  . Asthma 05/20/2007  . Human immunodeficiency virus (HIV) disease (HCC) 05/05/2007    Past Surgical History:  Procedure Laterality Date  . DENTAL SURGERY         Home Medications    Prior to Admission medications   Medication Sig Start Date End Date Taking? Authorizing Provider  albuterol (PROVENTIL HFA;VENTOLIN HFA)  108 (90 Base) MCG/ACT inhaler Inhale 2 puffs into the lungs every 6 (six) hours as needed for wheezing or shortness of breath. Patient not taking: Reported on 07/20/2017 12/28/16   Oneta RackLewis, Tanika N, NP  bictegravir-emtricitabine-tenofovir AF (BIKTARVY) 50-200-25 MG TABS tablet Take 1 tablet by mouth daily. 07/20/17   Judyann MunsonSnider, Cynthia, MD  busPIRone (BUSPAR) 5 MG tablet Take 1 tablet (5 mg total) by mouth 2 (two) times daily. Patient not taking: Reported on 07/20/2017 03/12/17   Meredeth IdeLama, Gagan S, MD  busPIRone (BUSPAR) 5 MG tablet Take 2 tablets (10 mg total) by mouth 2 (two) times daily. Patient not taking: Reported on 07/20/2017 03/31/17   Charm RingsLord, Jamison Y, NP  citalopram (CELEXA) 20 MG tablet Take 1 tablet (20 mg total) by mouth daily. Patient not taking: Reported on 07/20/2017 03/31/17   Charm RingsLord, Jamison Y, NP  gabapentin (NEURONTIN) 100 MG capsule Take 1 capsule (100 mg total) by mouth 3 (three) times daily. Patient not taking: Reported on 07/20/2017 03/31/17   Charm RingsLord, Jamison Y, NP  risperiDONE (RISPERDAL) 1 MG tablet Take 1 tablet (1 mg total) by mouth 2 (two) times daily. Patient not taking: Reported on 07/20/2017 03/31/17   Charm RingsLord, Jamison Y, NP  traZODone (DESYREL) 50 MG tablet Take 1 tablet (50 mg total) by mouth at bedtime as needed for sleep. Patient not taking: Reported on 07/20/2017 03/31/17   Charm RingsLord, Jamison Y, NP    Family History Family History  Problem Relation  Age of Onset  . Huntington's disease Father   . Heart disease Mother   . Suicidality Maternal Uncle   . Suicidality Maternal Grandmother     Social History Social History   Tobacco Use  . Smoking status: Current Every Day Smoker    Packs/day: 0.50    Types: Cigarettes    Start date: 09/29/1991  . Smokeless tobacco: Never Used  Substance Use Topics  . Alcohol use: Yes    Alcohol/week: 1.2 oz    Types: 2 Standard drinks or equivalent per week    Comment: little recently  . Drug use: Yes    Types: Cocaine, Marijuana    Comment:  smoked marijuana 1-2 days ago     Allergies   Magnesium-containing compounds; Peanut-containing drug products; Esomeprazole magnesium; Atripla [efavirenz-emtricitab-tenofovir]; Bactrim [sulfamethoxazole-trimethoprim]; and Penicillins   Review of Systems Review of Systems  Constitutional: Negative for fever.  Respiratory: Positive for cough. Negative for shortness of breath.   Cardiovascular: Negative for chest pain.  Gastrointestinal: Negative for abdominal pain, nausea and vomiting.  Psychiatric/Behavioral: Positive for sleep disturbance and suicidal ideas. Negative for self-injury.  All other systems reviewed and are negative.    Physical Exam Updated Vital Signs BP (!) 122/58   Pulse 62   Temp 98.3 F (36.8 C)   Resp 16   Ht 5\' 7"  (1.702 m)   Wt 70.8 kg (156 lb)   SpO2 99%   BMI 24.43 kg/m   Physical Exam  Constitutional: He is oriented to person, place, and time. He appears well-developed and well-nourished.  Generally disheveled appearing  HENT:  Head: Normocephalic and atraumatic.  Cardiovascular: Normal rate, regular rhythm and normal heart sounds.  No murmur heard. Pulmonary/Chest: Effort normal and breath sounds normal. No respiratory distress. He has no wheezes.  Abdominal: Soft. There is no tenderness.  Neurological: He is alert and oriented to person, place, and time.  Skin: Skin is warm and dry.  Psychiatric: He has a normal mood and affect.  Nursing note and vitals reviewed.    ED Treatments / Results  Labs (all labs ordered are listed, but only abnormal results are displayed) Labs Reviewed  COMPREHENSIVE METABOLIC PANEL - Abnormal; Notable for the following components:      Result Value   Glucose, Bld 100 (*)    BUN 5 (*)    AST 102 (*)    ALT 185 (*)    All other components within normal limits  ACETAMINOPHEN LEVEL - Abnormal; Notable for the following components:   Acetaminophen (Tylenol), Serum <10 (*)    All other components within  normal limits  ETHANOL  SALICYLATE LEVEL  CBC  RAPID URINE DRUG SCREEN, HOSP PERFORMED    EKG  EKG Interpretation None       Radiology No results found.  Procedures Procedures (including critical care time)  Medications Ordered in ED Medications  ipratropium (ATROVENT HFA) inhaler 2 puff (not administered)     Initial Impression / Assessment and Plan / ED Course  I have reviewed the triage vital signs and the nursing notes.  Pertinent labs & imaging results that were available during my care of the patient were reviewed by me and considered in my medical decision making (see chart for details).     She presents with suicidal otherwise nontoxic on exam.  Vital signs are reassuring.  He is voluntary.  Lab work obtained.  Notable for mild elevation in his LFTs.  He does report recent changes in HIV medications but  has not been taking any medications recently.  This is of unclear clinical significance.  Otherwise his lab work is reassuring.  Patient is medically clear for TTS evaluation.  Final Clinical Impressions(s) / ED Diagnoses   Final diagnoses:  Suicidal ideation  Polysubstance abuse [facility])    ED Discharge Orders    None       Wilkie Aye, Mayer Masker, MD 08/04/17 [phone]

## 2017-08-04 NOTE — Progress Notes (Signed)
TTS spoke with GrenadaBrittany, RN who requested "10 minutes" to allow time for the TTS cart to be set up for the pt in order to complete the assessment.  Princess BruinsAquicha Zacharee Gaddie, MSW, LCSW Therapeutic Triage Specialist  [phone]

## 2017-08-04 NOTE — BH Assessment (Addendum)
Tele Assessment Note   Patient Name: Edwin Martinez MRN: [id number] Referring Physician: Shon Baton, MD Location of Patient: [facility] Location of Provider: Behavioral Health Edwin Martinez Department  Edwin Martinez is an 31 y.o. male who presents to the ED voluntarily. Pt reports he intentionally attempted to walk in front of a car today in a suicide attempt, but the individual driving the car stopped and brought him to the hospital for help. Pt states he has attempted suicide multiple times in the past and has received inpt hospitalizations multiple times in the past c/o similar concerns. Pt identifies his stressors as being homeless, having no supports, losing his family, and the upcoming holidays without his family. Pt states he fears that he will get Huntington's Disease because many of his family members had it, therefore he would rather end his life now on his own.  Pt reports he also experiences AH with command to "just hang myself" and feelings of worthlessness. Pt does not have a current provider and states he has not been on his psych medication for months. Pt reports he was recently released from jail in August or September and while he was in jail, his "mental illness was not taken care of." Pt states his grandmother and 2 of his uncles committed suicide and both of his parents were suicidal.  Pt endorses frequent crack cocaine use along with marijuana and "some" alcohol several times a week. Pt states he feels he is alone and does not have anything to live for. Pt continues to endorse SI with intent and plan.   Case discussed with Edwin Sievert, PA who recommends inpt treatment. [facility] per [facility]. Edwin Martinez advised the pt's nurse Tori, RN and EDP Edwin Martinez, Edwin Masker, MD of the recommendation.   Diagnosis: Major Depressive Disorder, recurrent, w/ psychosis; Cocaine Use Disorder; Cannabis Use Disorder; Hx of Schizophrenia   Past Medical History:  Past Medical History:  Diagnosis Date   . ADHD (attention deficit hyperactivity disorder) 09/12/2012  . Anxiety   . Asthma   . Bipolar 1 disorder (HCC)   . HIV (human immunodeficiency virus infection) (HCC)   . Hypertension   . Schizophrenia (HCC)   . Seizures (HCC)     Past Surgical History:  Procedure Laterality Date  . DENTAL SURGERY      Family History:  Family History  Problem Relation Age of Onset  . Huntington's disease Father   . Heart disease Mother   . Suicidality Maternal Uncle   . Suicidality Maternal Grandmother     Social History:  reports that he has been smoking cigarettes.  He started smoking about 25 years ago. He has been smoking about 0.50 packs per day. he has never used smokeless tobacco. He reports that he drinks about 1.2 oz of alcohol per week. He reports that he uses drugs. Drugs: Cocaine and Marijuana.  Additional Social History:  Alcohol / Drug Use Pain Medications: See MAR Prescriptions: See MAR Over the Counter: See MAR History of alcohol / drug use?: Yes Substance #1 Name of Substance 1: Crack Cocaine 1 - Age of First Use: 17 1 - Amount (size/oz): $20 worth 1 - Frequency: 3x/week 1 - Duration: ongoing 1 - Last Use / Amount: 08/03/17 Substance #2 Name of Substance 2: Marijuana  2 - Age of First Use: 17 2 - Amount (size/oz): 1 blunt 2 - Frequency: 1x/week 2 - Duration: ongoing 2 - Last Use / Amount: 07/31/17 Substance #3 Name of Substance 3: Alcohol 3 -  Age of First Use: 15 3 - Amount (size/oz): 40 oz 3 - Frequency: 1x/week 3 - Duration: ongoing 3 - Last Use / Amount: 08/03/17  CIWA: CIWA-Ar BP: (!) 122/58 Pulse Rate: 62 Nausea and Vomiting: no nausea and no vomiting Tactile Disturbances: none Tremor: no tremor Auditory Disturbances: not present Paroxysmal Sweats: no sweat visible Visual Disturbances: not present Anxiety: no anxiety, at ease Headache, Fullness in Head: none present Agitation: somewhat more than normal activity Orientation and Clouding of  Sensorium: oriented and can do serial additions CIWA-Ar Total: 1 COWS: Clinical Opiate Withdrawal Scale (COWS) Resting Pulse Rate: Pulse Rate 80 or below Sweating: No report of chills or flushing Restlessness: Frequent shifting or extraneous movements of legs/arms Pupil Size: Pupils pinned or normal size for room light Bone or Joint Aches: Not present Runny Nose or Tearing: Not present GI Upset: No GI symptoms Tremor: No tremor Yawning: No yawning Anxiety or Irritability: None Gooseflesh Skin: Skin is smooth COWS Total Score: 3  PATIENT STRENGTHS: (choose at least two) Capable of independent living Communication skills General fund of knowledge Motivation for treatment/growth  Allergies:  Allergies  Allergen Reactions  . Magnesium-Containing Compounds Other (See Comments)    This medication is contraindicated with pts HIV meds.    . Peanut-Containing Drug Products Anaphylaxis  . Esomeprazole Magnesium Cough  . Atripla [Efavirenz-Emtricitab-Tenofovir] Other (See Comments)    Reaction:  Suicidal thoughts   . Bactrim [Sulfamethoxazole-Trimethoprim] Rash  . Penicillins Rash and Other (See Comments)    Has patient had a PCN reaction causing immediate rash, facial/tongue/throat swelling, SOB or lightheadedness with hypotension: Yes Has patient had a PCN reaction causing severe rash involving mucus membranes or skin necrosis: No Has patient had a PCN reaction that required hospitalization No Has patient had a PCN reaction occurring within the last 10 years: No If all of the above answers are "NO", then may proceed with Cephalosporin use.    Home Medications:  (Not in a hospital admission)  OB/GYN Status:  No LMP for male patient.  General Assessment Data Assessment unable to be completed: Yes Reason for not completing assessment: Edwin Martinez cart not set up when Edwin Martinez called to do the assessment. Nurse Edwin FosterBrittany, RN said to call back later [facility] Assessment:  In system Is this a Tele or Face-to-Face Assessment?: Tele Assessment Is this an Initial Assessment or a Re-assessment for this encounter?: Initial Assessment Marital status: Single Is patient pregnant?: No Pregnancy Status: No Living Arrangements: Other (Comment)(homeless) Can pt return to current living arrangement?: Yes Admission Status: Voluntary Is patient capable of signing voluntary admission?: Yes Referral Source: Self/Family/Friend Insurance type: none     Crisis Care Plan Living Arrangements: Other (Comment)(homeless) Name of Psychiatrist: none Name of Therapist: none  Education Status Is patient currently in school?: No Highest grade of school patient has completed: 8th Contact person: self  Risk to self with the past 6 months Suicidal Ideation: Yes-Currently Present Has patient been a risk to self within the past 6 months prior to admission? : Yes Suicidal Intent: Yes-Currently Present Has patient had any suicidal intent within the past 6 months prior to admission? : Yes Is patient at risk for suicide?: Yes Suicidal Plan?: Yes-Currently Present Has patient had any suicidal plan within the past 6 months prior to admission? : Yes Specify Current Suicidal Plan: pt states he attempted to jump in front of a car PTA to ED Access to Means: Yes Specify Access to Suicidal Means: pt has access  to traffic  What has been your use of drugs/alcohol within the last 12 months?: reports to weekly marijuana, crack cocaine, and alcohol use  Previous Attempts/Gestures: Yes How many times?: 3 Triggers for Past Attempts: Unpredictable Intentional Self Injurious Behavior: None Family Suicide History: Yes(grandmother, 2 uncles ) Recent stressful life event(s): Loss (Comment), Financial Problems(parents passed away) Persecutory voices/beliefs?: Yes Depression: Yes Depression Symptoms: Despondent, Insomnia, Tearfulness, Isolating, Fatigue, Loss of interest in usual pleasures, Feeling  angry/irritable, Feeling worthless/self pity, Guilt Substance abuse history and/or treatment for substance abuse?: Yes Suicide prevention information given to non-admitted patients: Not applicable  Risk to Others within the past 6 months Homicidal Ideation: No Does patient have any lifetime risk of violence toward others beyond the six months prior to admission? : No Thoughts of Harm to Others: No Current Homicidal Intent: No Current Homicidal Plan: No Access to Homicidal Means: No History of harm to others?: No Assessment of Violence: None Noted Does patient have access to weapons?: No Criminal Charges Pending?: No Does patient have a court date: No Is patient on probation?: No  Psychosis Hallucinations: Auditory, With command Delusions: None noted  Mental Status Report Appearance/Hygiene: Unremarkable, In scrubs Eye Contact: Good Motor Activity: Restlessness Speech: Logical/coherent Level of Consciousness: Alert Mood: Depressed, Helpless, Sad, Despair Affect: Depressed, Sad Anxiety Level: None Thought Processes: Coherent, Relevant Judgement: Impaired Orientation: Person, Time, Place, Situation, Appropriate for developmental age Obsessive Compulsive Thoughts/Behaviors: None  Cognitive Functioning Concentration: Normal Memory: Remote Intact, Recent Intact IQ: Average Insight: Poor Impulse Control: Poor Appetite: Good Sleep: Decreased Total Hours of Sleep: 4 Vegetative Symptoms: None  ADLScreening [facility] Assessment Services) Patient's cognitive ability adequate to safely complete daily activities?: Yes Patient able to express need for assistance with ADLs?: Yes Independently performs ADLs?: Yes (appropriate for developmental age)  Prior Inpatient Therapy Prior Inpatient Therapy: Yes Prior Therapy Dates: 2004, 2008, 2015, 2016, 2017, 2018 Prior Therapy Facilty/Provider(s): [facility], [facility], and other facilities Reason for Treatment: Schizoaffective D/O, Bipolar  D/O  Prior Outpatient Therapy Prior Outpatient Therapy: Yes Prior Therapy Dates: 2017 Prior Therapy Facilty/Provider(s): [name] Reason for Treatment: med management  Does patient have an ACCT team?: No Does patient have Intensive In-House Services?  : No Does patient have [name] services? : No(in the past) Does patient have P4CC services?: No  ADL Screening (condition at time of admission) Patient's cognitive ability adequate to safely complete daily activities?: Yes Is the patient deaf or have difficulty hearing?: No Does the patient have difficulty seeing, even when wearing glasses/contacts?: No Does the patient have difficulty concentrating, remembering, or making decisions?: No Patient able to express need for assistance with ADLs?: Yes Does the patient have difficulty dressing or bathing?: No Independently performs ADLs?: Yes (appropriate for developmental age) Does the patient have difficulty walking or climbing stairs?: No Weakness of Legs: None Weakness of Arms/Hands: None  Home Assistive Devices/Equipment Home Assistive Devices/Equipment: None    Abuse/Neglect Assessment (Assessment to be complete while patient is alone) Abuse/Neglect Assessment Can Be Completed: Yes Physical Abuse: Denies Verbal Abuse: Denies Sexual Abuse: Denies Exploitation of patient/patient's resources: Denies Self-Neglect: Denies     Merchant navy officer (For Healthcare) Does Patient Have a Medical Advance Directive?: No Would patient like information on creating a medical advance directive?: No - Patient declined    Additional Information 1:1 In Past 12 Months?: No CIRT Risk: No Elopement Risk: No Does patient have medical clearance?: Yes     Disposition:  Disposition Initial Assessment Completed for this Encounter: Yes Disposition of Patient:  Inpatient treatment program Type of inpatient treatment program: Adult(per Edwin SievertSpencer Simon, PA)  This service was provided via telemedicine  using a 2-way, interactive audio and video technology.  Names of all persons participating in this telemedicine service and their role in this encounter. Name: Edwin Martinez Role: Patient  Name: Princess BruinsAquicha Yuki Martinez Role: Edwin Martinez Counselor   Karolee Ohsquicha R Abdoul Encinas 08/04/2017 6:36 AM

## 2017-08-04 NOTE — ED Notes (Signed)
Voluntary admission and consent for treatment document has been signed and fax[facility] to [facility].

## 2017-08-04 NOTE — BH Assessment (Signed)
Pt currently under review by Dr.Pucilowska for possible BMU admission.

## 2017-08-04 NOTE — BH Assessment (Signed)
Patient has been accepted to [facility] BM[facility].  Patient assigned to room 322. Accepting and attending physician is Dr. Jennet MaduroPucilowska.  Call report to [phone].  Support papers requested to be faxed to [phone] prior to pt transport. Jolan, LCSWA informed of bed assignment. Pt access [facility](Josh) informed of pt acceptance for pre-admission.

## 2017-08-04 NOTE — Progress Notes (Signed)
Edwin Martinez is a 31 year old [facility] after attempting to jump in front of a moving vehicle. Individual driving the vehicle stopped the car and gave patient a ride to the emergency room. Patient admits that this was a suicide attempt and endorses feelings of hopelessness, helplessness, and depression. Reports history of bipolar disorder, asthma, schizophrenia and HIV infection. Denies previous suicide attempts but expresses to this writer that he has been feeling down and feeling more lonely, depressed, and isolated than usual. Patient attributes the upcoming holiday season to these increasing feelings. Patient endorses auditory hallucinations telling him that he is "worthless, better off dead, nobody cares". States that last time hearing voices were on the [facility]. Patient is able to contract for safety at this time. Reports previous inpatient admissions to Behavioral Medicine and other facilities. Patient is unable to identify any support systems, stating that his whole family is dead with the exception of a brother who is incarcerated until 2020. Goals identified by this patient include getting back on his medications (which reports he has been off of for one month) and obtaining stable housing as he is currently experiencing homelessness. Expresses worry about possibly having Huntington's disease due to family history of the disease. Reports recent THC and cocaine use. Reports drinking 1 40 oz. Beer once every 1 or 2 weeks. Reports being released from incarceration in September for reasons not disclosed to this Clinical research associatewriter. Patient searched and no contraband found Patient is oriented to the unit and room, skin assessed by this Clinical research associatewriter and Jamesetta SoPhyllis, Charity fundraiserN. Patient is cooperative and pleasant, has no complaints or concerns at this time.

## 2017-08-04 NOTE — BH Assessment (Signed)
[facility] and is pending bed assignment. Jolan, Social Work, informed.

## 2017-08-04 NOTE — ED Notes (Signed)
Requested urine sample from pt ? ?

## 2017-08-04 NOTE — Progress Notes (Signed)
Recreation Therapy Notes  Date: 11.07.18  Time: 3:00pm  Location: Outside  Behavioral response: Appropriate  Group Type: Leisure  Participation level: Active  Communication: Patient was social with peers and staff.  Comments: N/A  Alamin Mccuiston LRT/CTRS        Tauri Ethington 08/04/2017 4:22 PM

## 2017-08-04 NOTE — ED Notes (Signed)
Lunch tray ordered 

## 2017-08-04 NOTE — ED Notes (Signed)
All belongings inventoried and placed in [facility] (3 bags total) No valuables with security, no meds with pharmacy Pt has signed "what to expect" sheet and copy remains at bedside with pt

## 2017-08-04 NOTE — Progress Notes (Signed)
TTS attempted to complete assessment. Cart was called but no answer. TTS contacted [facility] and spoke with Verlon AuLeslie who states the nurse is unable to turn on the cart and requested an additional 15 minutes to locate the second cart.  Princess BruinsAquicha Jahon Bart, MSW, LCSW Therapeutic Triage Specialist  [phone]

## 2017-08-04 NOTE — Progress Notes (Signed)
[facility]  Accepting/ attending is Dr. Jennet MaduroPucilowska.  Pending bed assignment.     Edwin DaubJolan Ani Deoliveira MSW, LCSWA CSW Disposition [phone]

## 2017-08-04 NOTE — ED Triage Notes (Signed)
Pt states SI for several days with intent to run in front of a car. States he tried to run in front of a car but they stopped and brought him to the ER instead. Pt also states cocaine use, marijuana use, and "light alcohol" use earlier today. Denies any pain anywhere, anything else he needs to be seen for.

## 2017-08-05 DIAGNOSIS — F250 Schizoaffective disorder, bipolar type: Principal | ICD-10-CM
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
## 2017-08-05 MED ORDER — CITALOPRAM HYDROBROMIDE 20 MG PO TABS
20.0000 mg | ORAL_TABLET | Freq: Every day | ORAL | Status: DC
  Administered 2017-08-05 – 2017-08-12 (×8): 20 mg via ORAL
  Filled 2017-08-05 (×8): qty 1

## 2017-08-05 MED ORDER — BUSPIRONE HCL 10 MG PO TABS
10.0000 mg | ORAL_TABLET | Freq: Two times a day (BID) | ORAL | Status: DC
  Administered 2017-08-05 – 2017-08-12 (×14): 10 mg via ORAL
  Filled 2017-08-05 (×15): qty 1

## 2017-08-05 MED ORDER — GABAPENTIN 300 MG PO CAPS
300.0000 mg | ORAL_CAPSULE | Freq: Three times a day (TID) | ORAL | Status: DC
  Administered 2017-08-05 – 2017-08-12 (×19): 300 mg via ORAL
  Filled 2017-08-05 (×21): qty 1

## 2017-08-05 MED ORDER — BICTEGRAVIR-EMTRICITAB-TENOFOV 50-200-25 MG PO TABS
1.0000 | ORAL_TABLET | Freq: Every day | ORAL | Status: DC
  Administered 2017-08-05 – 2017-08-12 (×8): 1 via ORAL
  Filled 2017-08-05 (×8): qty 1

## 2017-08-05 MED ORDER — RISPERIDONE 1 MG PO TABS
3.0000 mg | ORAL_TABLET | Freq: Two times a day (BID) | ORAL | Status: DC
  Administered 2017-08-05 – 2017-08-11 (×12): 3 mg via ORAL
  Filled 2017-08-05 (×12): qty 3

## 2017-08-05 MED ORDER — PNEUMOCOCCAL VAC POLYVALENT 25 MCG/0.5ML IJ INJ: 0.5000 mL | INJECTION | INTRAMUSCULAR | Status: DC

## 2017-08-05 NOTE — [facility] Group Notes (Signed)
[facility] LCSW Group Therapy Note  Date/Time: 08/05/17, 0930  Type of Therapy/Topic:  Group Therapy:  Balance in Life  Participation Level:  Did not attend  Description of Group:    This group will address the concept of balance and how it feels and looks when one is unbalanced. Patients will be encouraged to process areas in their lives that are out of balance, and identify reasons for remaining unbalanced. Facilitators will guide patients utilizing problem- solving interventions to address and correct the stressor making their life unbalanced. Understanding and applying boundaries will be explored and addressed for obtaining  and maintaining a balanced life. Patients will be encouraged to explore ways to assertively make their unbalanced needs known to significant others in their lives, using other group members and facilitator for support and feedback.  Therapeutic Goals: 1. Patient will identify two or more emotions or situations they have that consume much of in their lives. 2. Patient will identify signs/triggers that life has become out of balance:  3. Patient will identify two ways to set boundaries in order to achieve balance in their lives:  4. Patient will demonstrate ability to communicate their needs through discussion and/or role plays  Summary of Patient Progress:          Therapeutic Modalities:   Cognitive Behavioral Therapy Solution-Focused Therapy Assertiveness Training  Greg Cristela Stalder, LCSW 

## 2017-08-05 NOTE — Progress Notes (Signed)
Recreation Therapy Notes  Date: 11.08.18  Time: 1:00pm   Location: Craft Room  Behavioral response: N/A  Intervention Topic: Life Planning  Discussion/Intervention: Patient did not attend group. Clinical Observations/Feedback:  Patient did not attend group. Adalynd Donahoe LRT/CTRS        Khrystyne Arpin 08/05/2017 2:12 PM 
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
## 2017-08-05 NOTE — Progress Notes (Signed)
This patient received PPSV23 on 05/05/2007, 09/06/2012, and 10/28/2014. He is not eligible for an additional dose at this time. The order for PPSV23 has been discontinued.  Quin Mathenia A. White Oak[place]son, VermontPharm.D., BCPS Clinical Pharmacist 08/05/2017 09:15

## 2017-08-05 NOTE — [facility] Suicide Risk Assessment (Signed)
[facility] INPATIENT:  Family/Significant Other Suicide Prevention Education  Suicide Prevention Education:  Contact Attempts: Edwin Martinez, [address], [phone], (name of family member/significant other) has been identified by the patient as the family member/significant other with whom the patient will be residing, and identified as the person(s) who will aid the patient in the event of a mental health crisis.  With written consent from the patient, two attempts were made to provide suicide prevention education, prior to and/or following the patient's discharge.  We were unsuccessful in providing suicide prevention education.  A suicide education pamphlet was given to the patient to share with family/significant other.  Date and time of second attempt: 08/05/17 at 5:30 PM, unable to leave VM as VM was not personally identified  Edwin Martinez 08/05/2017, 5:27 PM

## 2017-08-05 NOTE — Plan of Care (Signed)
D: Patient reports that he slept well last night. Patient states that his depression is at a "8/10" and his anxiety is at "9/10" today. Patient verbalizes some understanding of his medication regimen. Patient's goal for today is "meds", which he will accomplish this goal by "take my meds". Patient endorses thoughts of passive SI without a plan, but does contract for safety at this time.

## 2017-08-05 NOTE — Progress Notes (Signed)
D- Patient alert and oriented. Patient presents in a drowsy mood but states that he slept well last night. Patient reports that his depression level is at "8/10" and his anxiety level is at "9/10". Patient reports on his self-inventory that he is having problems with a cough. Denies SI, HI, AVH, and pain. Patient states that his goal for today is "meds" and he will accomplish this goal by "take meds".  A- Scheduled medications administered to patient, per MD orders. Support and encouragement provided.  Routine safety checks conducted every 15 minutes.  Patient informed to notify staff with problems or concerns.  R- No adverse drug reactions noted. Patient contracts for safety at this time. Patient compliant with medications and treatment plan. Patient receptive, calm, and cooperative. Patient interacts well with others on the unit.  Patient remains safe at this time.

## 2017-08-05 NOTE — Progress Notes (Signed)
D: Pt denies SI/HI/AV. Pt is pleasant and cooperative. Pt stayed in room duration of the evening.    A: Pt was offered support and encouragement. Pt was given scheduled medications. Pt was encourage to attend groups. Q 15 minute checks were done for safety.  R: safety maintained on unit.

## 2017-08-05 NOTE — [facility] Group Notes (Signed)
[facility] Group Notes:  (Nursing/MHT/Case Management/Adjunct)  Date:  08/05/2017  Time:  9:53 PM  Type of Therapy:  Evening Wrap-up Group  Participation Level:  Did Not Attend  Participation Quality:  N/A  Affect:  N/A  Cognitive:  N/A  Insight:  None  Engagement in Group:  Did Not Attend  Modes of Intervention:  Discussion  Summary of Progress/Problems:  Tomasita MorrowChelsea Nanta Cendy Oconnor 08/05/2017, 9:53 PM

## 2017-08-05 NOTE — Progress Notes (Signed)
Recreation Therapy Notes  Date: 11.08.18  Time: 3:00pm  Location: Craft room  Behavioral response: N/A  Group Type: Craft  Participation level: N/A  Communication: Patient did not attend group.  Comments: N/A  [name] LRT/CTRS         Edwin Martinez 08/05/2017 4:09 PM
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
## 2017-08-05 NOTE — [facility] Suicide Risk Assessment (Addendum)
[facility]   Nursing information obtained from:    Demographic factors:    Current Mental Status:    Loss Factors:    Historical Factors:    Risk Reduction Factors:     Total Time spent with patient: 1 hour Principal Problem: Schizoaffective disorder, bipolar type ([facility]) Diagnosis:   Patient Active Problem List   Diagnosis Date Noted  . Major depressive disorder, recurrent, severe with psychotic features ([facility]) [F33.3] 08/04/2017  . 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 Herpes zoster [B02.9] 03/06/2017  . Polysubstance abuse ([facility]) [F19.10] 03/06/2017  . Homelessness [Z59.0] 03/06/2017  . Schizoaffective disorder, bipolar type ([facility]) [F25.0] 12/23/2016  . Suicidal ideation [R45.851]   . Cannabis use disorder, moderate, dependence ([facility]) [F12.20] 07/27/2016  . Tobacco use disorder [F17.200] 07/27/2016  . Intentional drug overdose ([facility]) [T50.902A] 07/18/2016  . Asthma [J45.909] 05/20/2007  . HIV disease ([facility]) [B20] 05/05/2007   Subjective Data: suicide attempt.  Continued Clinical Symptoms:  Alcohol Use Disorder Identification Test Final Score (AUDIT): 2 The "Alcohol Use Disorders Identification Test", Guidelines for Use in Primary Care, Second Edition.  World Science writerHealth Organization [facility](WHO). Score between 0-7:  no or low risk or alcohol related problems. Score between 8-15:  moderate risk of alcohol related problems. Score between 16-19:  high risk of alcohol related problems. Score 20 or above:  warrants further diagnostic evaluation for alcohol dependence and treatment.   CLINICAL FACTORS:   Bipolar Disorder:   Depressive phase Depression:   Comorbid alcohol abuse/dependence Impulsivity Alcohol/Substance Abuse/Dependencies   Musculoskeletal: Strength & Muscle Tone: within normal limits Gait & Station: normal Patient leans: N/A  Psychiatric Specialty Exam: Physical Exam  Nursing note and vitals  reviewed. Psychiatric: His speech is normal. He is actively hallucinating. Cognition and memory are normal. He expresses impulsivity. He exhibits a depressed mood. He expresses suicidal ideation. He expresses suicidal plans.    Review of Systems  Neurological: Negative.   Psychiatric/Behavioral: Positive for depression, hallucinations, substance abuse and suicidal ideas.  All other systems reviewed and are negative.   Blood pressure 119/79, pulse 83, temperature 98.2 F (36.8 C), temperature source Oral, resp. rate 16, height 5\' 7"  (1.702 m), weight 70.8 kg (156 lb), SpO2 98 %.Body mass index is 24.43 kg/m.  General Appearance: Fairly Groomed  Eye Contact:  Good  Speech:  Clear and Coherent  Volume:  Normal  Mood:  Depressed  Affect:  Flat  Thought Process:  Goal Directed and Descriptions of Associations: Intact  Orientation:  Full (Time, Place, and Person)  Thought Content:  Hallucinations: Auditory  Suicidal Thoughts:  Yes.  with intent/plan  Homicidal Thoughts:  No  Memory:  Immediate;   Fair Recent;   Fair Remote;   Fair  Judgement:  Poor  Insight:  Lacking  Psychomotor Activity:  Decreased  Concentration:  Concentration: Fair and Attention Span: Fair  Recall:  FiservFair  Fund of Knowledge:  Fair  Language:  Fair  Akathisia:  No  Handed:  Right  AIMS (if indicated):     Assets:  Communication Skills Desire for Improvement Resilience  ADL's:  Intact  Cognition:  WNL  Sleep:  Number of Hours: 9      COGNITIVE FEATURES THAT CONTRIBUTE TO RISK:  None    SUICIDE RISK:   Moderate:  Frequent suicidal ideation with limited intensity, and duration, some specificity in terms of plans, no associated intent, good self-control, limited dysphoria/symptomatology, some risk factors present, and identifiable protective factors,  including available and accessible social support.  PLAN OF CARE: hospital admission, medication management, substance abuse counseling, discharge  planning.  Edwin Martinez is a 31 year old male with a history of bipolar illness and substance abuse admitted after suicide attempt by stepping in front of a car in the context of treatment noncompliance, substance abuse and severe social stressors.   #Suicidal ideation -patient able to contract for safety in the hospital  #Mood and psychosis -Restart Risperdal 3 mg BID -Restart Celexa 20 mg daily -Restart BuSpar 10 mg BID -Restart Depakote 500 mg TID  #Insomnia -Trazodone 100 mg nightly  #Chronic pain -Neurontin 300 mg TID  #HIV -Continue Biktarvy  #Substance abuse -patient minimizes his problems and declines treatment -no symptoms of withdrawal -vital signs stable  #Metabolic syndrome monitoring -lipid panel, TSH and HgbA1C done on 12/23/2016 -EKG pending  #Disposition [place] -[facility] -Follow up with ID Clinic [place]  I certify that inpatient services furnished can reasonably be expected to improve the patient's condition.   Kristine LineaJolanta Traves Majchrzak, MD 08/05/2017, 1:20 PM

## 2017-08-05 NOTE — [facility] Suicide Risk Assessment (Signed)
[facility] INPATIENT:  Family/Significant Other Suicide Prevention Education  Suicide Prevention Education:  Contact Attempts: Edwin Martinez, [address], [phone], (name of family member/significant other) has been identified by the patient as the family member/significant other with whom the patient will be residing, and identified as the person(s) who will aid the patient in the event of a mental health crisis.  With written consent from the patient, two attempts were made to provide suicide prevention education, prior to and/or following the patient's discharge.  We were unsuccessful in providing suicide prevention education.  A suicide education pamphlet was given to the patient to share with family/significant other.  Date and time of first attempt: 11/8 at 12:45 PM; did not leave VM as VM was not personally identified by name  Edwin Martinez 08/05/2017, 12:53 PM

## 2017-08-05 NOTE — Progress Notes (Signed)
D: Patient is alert and oriented x 4, denies SI/HI/AVH, affect is flat and sad, mood is labile, he does not want to participate in treatment plan and he was noted lying in bed with eyes closed most of the shift. No distress noted.  A: Pt was offered support and encouragement, encouraged to attend groups and 15 minute round checks were maintained for safety.  R:Pt did not attend evening group, safety maintained on unit.

## 2017-08-05 NOTE — Plan of Care (Signed)
Pt safe on the unit at this time 

## 2017-08-06 MED ORDER — ENSURE ENLIVE PO LIQD
237.0000 mL | Freq: Three times a day (TID) | ORAL | Status: DC
  Administered 2017-08-06 – 2017-08-11 (×15): 237 mL via ORAL

## 2017-08-06 NOTE — Tx Team (Addendum)
Interdisciplinary Treatment and Diagnostic Plan Update  08/06/2017 Time of Session: 1045 Edwin Martinez MRN: [id number]  Principal Diagnosis: Schizoaffective disorder, bipolar type [facility])  Secondary Diagnoses: Principal Problem:   Schizoaffective disorder, bipolar type (HCC) Active Problems:   HIV disease (HCC)   Cannabis use disorder, moderate, dependence (HCC)   Tobacco use disorder   Homelessness   Cocaine abuse with cocaine-induced mood disorder (HCC)   Current Medications:  Current Facility-Administered Medications  Medication Dose Route Frequency Provider Last Rate Last Dose  . acetaminophen (TYLENOL) tablet 650 mg  650 mg Oral Q6H PRN Pucilowska, Jolanta B, MD      . bictegravir-emtricitabine-tenofovir AF (BIKTARVY) 50-200-25 MG per tablet 1 tablet  1 tablet Oral Daily Pucilowska, Jolanta B, MD   1 tablet at 08/06/17 0912  . busPIRone (BUSPAR) tablet 10 mg  10 mg Oral BID Pucilowska, Jolanta B, MD   10 mg at 08/06/17 0913  . citalopram (CELEXA) tablet 20 mg  20 mg Oral Daily Pucilowska, Jolanta B, MD   20 mg at 08/06/17 0912  . divalproex (DEPAKOTE) DR tablet 500 mg  500 mg Oral Q8H Pucilowska, Jolanta B, MD   500 mg at 08/06/17 0644  . gabapentin (NEURONTIN) capsule 300 mg  300 mg Oral TID Pucilowska, Jolanta B, MD   300 mg at 08/06/17 1229  . hydrOXYzine (ATARAX/VISTARIL) tablet 50 mg  50 mg Oral TID PRN Pucilowska, Jolanta B, MD      . nicotine (NICODERM CQ - dosed in mg/24 hours) patch 21 mg  21 mg Transdermal Q0600 Pucilowska, Jolanta B, MD      . risperiDONE (RISPERDAL) tablet 3 mg  3 mg Oral BID Pucilowska, Jolanta B, MD   3 mg at 08/06/17 0912  . traZODone (DESYREL) tablet 100 mg  100 mg Oral QHS Pucilowska, Jolanta B, MD   100 mg at 08/05/17 2147   PTA Medications: Medications Prior to Admission  Medication Sig Dispense Refill Last Dose  . albuterol (PROVENTIL HFA;VENTOLIN HFA) 108 (90 Base) MCG/ACT inhaler Inhale 2 puffs into the lungs every 6 (six) hours as needed  for wheezing or shortness of breath. (Patient not taking: Reported on 07/20/2017) 1 Inhaler 0 Not Taking at Unknown time  . bictegravir-emtricitabine-tenofovir AF (BIKTARVY) 50-200-25 MG TABS tablet Take 1 tablet by mouth daily. 30 tablet 2   . busPIRone (BUSPAR) 5 MG tablet Take 2 tablets (10 mg total) by mouth 2 (two) times daily. (Patient not taking: Reported on 07/20/2017) 60 tablet 0 Not Taking at Unknown time  . citalopram (CELEXA) 20 MG tablet Take 1 tablet (20 mg total) by mouth daily. (Patient not taking: Reported on 07/20/2017) 30 tablet 0 Not Taking at Unknown time  . gabapentin (NEURONTIN) 100 MG capsule Take 1 capsule (100 mg total) by mouth 3 (three) times daily. (Patient not taking: Reported on 07/20/2017) 90 capsule 0 Not Taking at Unknown time  . risperiDONE (RISPERDAL) 1 MG tablet Take 1 tablet (1 mg total) by mouth 2 (two) times daily. (Patient not taking: Reported on 07/20/2017) 60 tablet 0 Not Taking at Unknown time  . traZODone (DESYREL) 50 MG tablet Take 1 tablet (50 mg total) by mouth at bedtime as needed for sleep. (Patient not taking: Reported on 07/20/2017) 30 tablet 0 Not Taking at Unknown time    Patient Stressors: Financial difficulties Medication change or noncompliance Substance abuse  Patient Strengths: Ability for insight Active sense of humor Average or above average intelligence Capable of independent living Communication skills  Treatment Modalities:  Medication Management, Group therapy, Case management,  1 to 1 session with clinician, Psychoeducation, Recreational therapy.   Physician Treatment Plan for Primary Diagnosis: Schizoaffective disorder, bipolar type (HCC) Long Term Goal(s): Improvement in symptoms so as ready for discharge Improvement in symptoms so as ready for discharge   Short Term Goals: Ability to identify changes in lifestyle to reduce recurrence of condition will improve Ability to verbalize feelings will improve Ability to disclose  and discuss suicidal ideas Ability to demonstrate self-control will improve Ability to identify and develop effective coping behaviors will improve Ability to maintain clinical measurements within normal limits will improve Compliance with prescribed medications will improve Ability to identify triggers associated with substance abuse/mental health issues will improve Ability to identify changes in lifestyle to reduce recurrence of condition will improve Ability to demonstrate self-control will improve Ability to identify triggers associated with substance abuse/mental health issues will improve  Medication Management: Evaluate patient's response, side effects, and tolerance of medication regimen.  Therapeutic Interventions: 1 to 1 sessions, Unit Group sessions and Medication administration.  Evaluation of Outcomes: Progressing  Physician Treatment Plan for Secondary Diagnosis: Principal Problem:   Schizoaffective disorder, bipolar type (HCC) Active Problems:   HIV disease (HCC)   Cannabis use disorder, moderate, dependence (HCC)   Tobacco use disorder   Homelessness   Cocaine abuse with cocaine-induced mood disorder (HCC)  Long Term Goal(s): Improvement in symptoms so as ready for discharge Improvement in symptoms so as ready for discharge   Short Term Goals: Ability to identify changes in lifestyle to reduce recurrence of condition will improve Ability to verbalize feelings will improve Ability to disclose and discuss suicidal ideas Ability to demonstrate self-control will improve Ability to identify and develop effective coping behaviors will improve Ability to maintain clinical measurements within normal limits will improve Compliance with prescribed medications will improve Ability to identify triggers associated with substance abuse/mental health issues will improve Ability to identify changes in lifestyle to reduce recurrence of condition will improve Ability to  demonstrate self-control will improve Ability to identify triggers associated with substance abuse/mental health issues will improve     Medication Management: Evaluate patient's response, side effects, and tolerance of medication regimen.  Therapeutic Interventions: 1 to 1 sessions, Unit Group sessions and Medication administration.  Evaluation of Outcomes: Progressing   RN Treatment Plan for Primary Diagnosis: Schizoaffective disorder, bipolar type (HCC) Long Term Goal(s): Knowledge of disease and therapeutic regimen to maintain health will improve  Short Term Goals: Ability to identify and develop effective coping behaviors will improve and Compliance with prescribed medications will improve  Medication Management: RN will administer medications as ordered by provider, will assess and evaluate patient's response and provide education to patient for prescribed medication. RN will report any adverse and/or side effects to prescribing provider.  Therapeutic Interventions: 1 on 1 counseling sessions, Psychoeducation, Medication administration, Evaluate responses to treatment, Monitor vital signs and CBGs as ordered, Perform/monitor CIWA, COWS, AIMS and Fall Risk screenings as ordered, Perform wound care treatments as ordered.  Evaluation of Outcomes: Progressing   LCSW Treatment Plan for Primary Diagnosis: Schizoaffective disorder, bipolar type (HCC) Long Term Goal(s): Safe transition to appropriate next level of care at discharge, Engage patient in therapeutic group addressing interpersonal concerns.  Short Term Goals: Engage patient in aftercare planning with referrals and resources, Increase social support and Increase skills for wellness and recovery  Therapeutic Interventions: Assess for all discharge needs, 1 to 1 time with Social worker, Explore available resources and support systems,  Assess for adequacy in community support network, Educate family and significant other(s) on  suicide prevention, Complete Psychosocial Assessment, Interpersonal group therapy.  Evaluation of Outcomes: Progressing   Progress in Treatment: Attending groups: No. Participating in groups: No. Taking medication as prescribed: Yes. Toleration medication: Yes. Family/Significant other contact made: No, will contact:  godfather Patient understands diagnosis: Yes. Discussing patient identified problems/goals with staff: Yes. Medical problems stabilized or resolved: Yes. Denies suicidal/homicidal ideation: Yes. Issues/concerns per patient self-inventory: No. Other: none  New problem(s) identified: No, Describe:  none  New Short Term/Long Term Goal(s):Pt goal: "Stay on medication and listen to my MD."  Discharge Plan or Barriers: CSW assessing for appropriate plan.  Reason for Continuation of Hospitalization: Depression Medication stabilization  Estimated Length of Stay: 2-4 days.  Recreational Therapy: Patient Stressors: N/A Patient Goal: The Patient will attend and participate in Recreation Therapy Group Sessions x5 days.  Attendees: Patient:Edwin Martinez 08/06/2017   Physician: Dr. Jennet MaduroPucilowska, MD 08/06/2017   Nursing:  08/06/2017   RN Care Manager: 08/06/2017   Social Worker: Daleen SquibbGreg Wierda, LCSW 08/06/2017   Recreational Therapist: Garret ReddishShay Courtenay Creger, LRT/CTRS 08/06/2017   Other:  08/06/2017   Other:  08/06/2017   Other: 08/06/2017        Scribe for Treatment Team: Lorri FrederickWierda, Gregory Jon, LCSW 08/06/2017 12:40 PM

## 2017-08-06 NOTE — Progress Notes (Signed)
Signed           [] Hide copied text  [] Hover for details   Patient alert and oriented to self. States, "No I don't the voices right now. " When asked what does the voices say to him, pt responded, "They tell me that I am no good." Patient in bed with eyes closed with covers thrown over his head for most of the shift. Compliant with meals and meds.  Will continue to monitor and notify  MD of acute changes.

## 2017-08-06 NOTE — Progress Notes (Signed)
Recreation Therapy Notes  INPATIENT RECREATION THERAPY ASSESSMENT  Patient Details Name: [name] A Paredez MRN: [id number] DOB: Jun 20, 1986 Today's Date: 08/06/2017 Patient refused assessment stated he was to tired.  Patient Stressors:    Coping Skills:      Engineer, building servicesersonal Challenges:    Leisure Interests (2+):     Awareness of Community Resources:     [facility] Resources:     Current Use:    If no, Barriers?:    Patient Strengths:     Patient Identified Areas of Improvement:     Current Recreation Participation:     Patient Goal for Hospitalization:     [place]      Current SI (including self-harm):     Current HI:     Consent to Intern Participation:     Makar Slatter 08/06/2017, 2:34 PM

## 2017-08-06 NOTE — Plan of Care (Signed)
Patient alert and oriented to self. States, "No I don't the voices right now. " When asked what does the voices say to him, pt responded, "They tell me that I am no good." Patient in bed with eyes closed with covers thrown over his head for most of the shift. Compliant with meals and meds.

## 2017-08-06 NOTE — Progress Notes (Signed)
[facility] MD Progress Note  08/06/2017 12:42 PM SAMEUL TAGLE  MRN:  [id number]  Subjective:   Mr. Jost met with treatment team today. He is still depressed and has passing thought of suicide. Feel slightly sedated from restarted medications. Reports no side effects.  Treatment plan. We restarted all his medications.  Social/disposition. He will be discharged to the homeless shelter. He is not interested in substance abuse treatment. He follows up with ID clinic.  Principal Problem: Schizoaffective disorder, bipolar type ([place]) Diagnosis:   Patient Active Problem List   Diagnosis Date Noted  . Major depressive disorder, recurrent, severe with psychotic features ([place]) [F33.3] 08/04/2017  . Cocaine abuse with cocaine-induced mood disorder ([place]) [F14.14] 03/30/2017  . Herpes zoster [B02.9] 03/06/2017  . Polysubstance abuse ([place]) [F19.10] 03/06/2017  . Homelessness [Z59.0] 03/06/2017  . Schizoaffective disorder, bipolar type ([place]) [F25.0] 12/23/2016  . Suicidal ideation [R45.851]   . Cannabis use disorder, moderate, dependence ([place]) [F12.20] 07/27/2016  . Tobacco use disorder [F17.200] 07/27/2016  . Intentional drug overdose ([place]) [T50.902A] 07/18/2016  . Asthma [J45.909] 05/20/2007  . HIV disease ([place]) [B20] 05/05/2007   Total Time spent with patient: 20 minutes  Past Psychiatric History: mood instability, substance abuse.  Past Medical History:  Past Medical History:  Diagnosis Date  . ADHD (attention deficit hyperactivity disorder) 09/12/2012  . Anxiety   . Asthma   . Bipolar 1 disorder ([place])   . HIV (human immunodeficiency virus infection) ([place])   . Hypertension   . Schizophrenia ([place])   . Seizures ([place])     Past Surgical History:  Procedure Laterality Date  . DENTAL SURGERY     Family History:  Family History  Problem Relation Age of Onset  . Huntington's disease Father   . Heart disease Mother   . Suicidality Maternal Uncle   . Suicidality Maternal Grandmother     Family Psychiatric  History: Mother with bipolar, three completed suicides. Social History:  Social History   Substance and Sexual Activity  Alcohol Use Yes  . Alcohol/week: 1.2 oz  . Types: 2 Standard drinks or equivalent per week   Comment: little recently     Social History   Substance and Sexual Activity  Drug Use Yes  . Types: Cocaine, Marijuana   Comment: smoked marijuana 1-2 days ago    Social History   Socioeconomic History  . Marital status: Single    Spouse name: None  . Number of children: None  . Years of education: None  . Highest education level: None  Social Needs  . Financial resource strain: None  . Food insecurity - worry: None  . Food insecurity - inability: None  . Transportation needs - medical: None  . Transportation needs - non-medical: None  Occupational History  . Occupation: disability pending  Tobacco Use  . Smoking status: Current Every Day Smoker    Packs/day: 0.50    Types: Cigarettes    Start date: 09/29/1991  . Smokeless tobacco: Never Used  Substance and Sexual Activity  . Alcohol use: Yes    Alcohol/week: 1.2 oz    Types: 2 Standard drinks or equivalent per week    Comment: little recently  . Drug use: Yes    Types: Cocaine, Marijuana    Comment: smoked marijuana 1-2 days ago  . Sexual activity: Yes    Birth control/protection: Condom  Other Topics Concern  . None  Social History Narrative   ** Merged History Encounter **  Additional Social History:                         Sleep: Fair  Appetite:  Fair  Current Medications: Current Facility-Administered Medications  Medication Dose Route Frequency Provider Last Rate Last Dose  . acetaminophen (TYLENOL) tablet 650 mg  650 mg Oral Q6H PRN Jazmine Heckman B, MD      . bictegravir-emtricitabine-tenofovir AF (BIKTARVY) 50-200-25 MG per tablet 1 tablet  1 tablet Oral Daily Neyah Ellerman B, MD   1 tablet at 08/06/17 0912  . busPIRone (BUSPAR)  tablet 10 mg  10 mg Oral BID Keymoni Mccaster B, MD   10 mg at 08/06/17 0913  . citalopram (CELEXA) tablet 20 mg  20 mg Oral Daily Jaydeen Darley B, MD   20 mg at 08/06/17 0912  . divalproex (DEPAKOTE) DR tablet 500 mg  500 mg Oral Q8H Zeniya Lapidus B, MD   500 mg at 08/06/17 0644  . gabapentin (NEURONTIN) capsule 300 mg  300 mg Oral TID Hawkins Seaman B, MD   300 mg at 08/06/17 1229  . hydrOXYzine (ATARAX/VISTARIL) tablet 50 mg  50 mg Oral TID PRN Artez Regis B, MD      . nicotine (NICODERM CQ - dosed in mg/24 hours) patch 21 mg  21 mg Transdermal Q0600 Margaret Staggs B, MD      . risperiDONE (RISPERDAL) tablet 3 mg  3 mg Oral BID Jatziry Wechter B, MD   3 mg at 08/06/17 0912  . traZODone (DESYREL) tablet 100 mg  100 mg Oral QHS Vergia Chea B, MD   100 mg at 08/05/17 2147    Lab Results: No results found for this or any previous visit (from the past 48 hour(s)).  Blood Alcohol level:  Lab Results  Component Value Date   ETH <10 08/04/2017   ETH 7 (H) 31/54/0086    Metabolic Disorder Labs: Lab Results  Component Value Date   HGBA1C 5.6 12/24/2016   MPG 114 12/24/2016   MPG 111 05/04/2016   Lab Results  Component Value Date   PROLACTIN 28.8 (H) 12/24/2016   PROLACTIN 32.3 (H) 08/19/2016   Lab Results  Component Value Date   CHOL 128 12/24/2016   TRIG 130 12/24/2016   HDL 35 (L) 12/24/2016   CHOLHDL 3.7 12/24/2016   VLDL 26 12/24/2016   LDLCALC 67 12/24/2016   LDLCALC 75 08/19/2016    Physical Findings: AIMS: Facial and Oral Movements Muscles of Facial Expression: None, normal Lips and Perioral Area: None, normal Jaw: None, normal Tongue: None, normal,Extremity Movements Upper (arms, wrists, hands, fingers): None, normal Lower (legs, knees, ankles, toes): None, normal, Trunk Movements Neck, shoulders, hips: None, normal, Overall Severity Severity of abnormal movements (highest score from questions above): None,  normal Incapacitation due to abnormal movements: None, normal Patient's awareness of abnormal movements (rate only patient's report): No Awareness, Dental Status Current problems with teeth and/or dentures?: No(Per pt statement) Does patient usually wear dentures?: No  CIWA:  CIWA-Ar Total: 0 COWS:  COWS Total Score: 0  Musculoskeletal: Strength & Muscle Tone: within normal limits Gait & Station: normal Patient leans: N/A  Psychiatric Specialty Exam: Physical Exam  Nursing note and vitals reviewed. Psychiatric: His speech is normal. His affect is blunt. He is slowed and withdrawn. Cognition and memory are normal. He expresses impulsivity. He exhibits a depressed mood. He expresses suicidal ideation. He expresses suicidal plans.    Review of Systems  Neurological: Negative.  Psychiatric/Behavioral: Positive for depression, substance abuse and suicidal ideas.  All other systems reviewed and are negative.   Blood pressure 124/66, pulse 77, temperature 98.2 F (36.8 C), temperature source Oral, resp. rate 18, height 5' 7"  (1.702 m), weight 70.8 kg (156 lb), SpO2 98 %.Body mass index is 24.43 kg/m.  General Appearance: Fairly Groomed  Eye Contact:  Good  Speech:  Clear and Coherent  Volume:  Normal  Mood:  Depressed  Affect:  Flat  Thought Process:  Goal Directed and Descriptions of Associations: Intact  Orientation:  Full (Time, Place, and Person)  Thought Content:  WDL  Suicidal Thoughts:  Yes.  with intent/plan  Homicidal Thoughts:  No  Memory:  Immediate;   Fair Recent;   Fair Remote;   Fair  Judgement:  Poor  Insight:  Lacking  Psychomotor Activity:  Psychomotor Retardation  Concentration:  Concentration: Fair and Attention Span: Fair  Recall:  [employer] of Knowledge:  Fair  Language:  Fair  Akathisia:  No  Handed:  Right  AIMS (if indicated):     Assets:  Communication Skills Desire for Improvement Resilience  ADL's:  Intact  Cognition:  WNL  Sleep:  Number  of Hours: 8.15     Treatment Plan Summary: Daily contact with patient to assess and evaluate symptoms and progress in treatment and Medication management   Mr. Merlos is a 31 year old male with a history of bipolar illness and substance abuse admitted after suicide attempt by stepping in front of a car in the context of treatment noncompliance, substance abuse and severe social stressors.   #Suicidal ideation -patient able to contract for safety in the hospital  #Mood and psychosis -Restart Risperdal 3 mg BID -Restart Celexa 20 mg daily -Restart BuSpar 10 mg BID -Restart Depakote 500 mg TID, level on Monday  #Insomnia -Trazodone 100 mg nightly  #Chronic pain -Neurontin 300 mg TID  #HIV -Continue Biktarvy  #Substance abuse -patient minimizes his problems and declines treatment -no symptoms of withdrawal -vital signs stable  #Metabolic syndrome monitoring -lipid panel, TSH and HgbA1C done on 12/23/2016 -EKG QTc 392  #Weight loss -will offer Ensure  #Disposition -discharge to the homeless shelter in [place] -Follow up at [facility] -Follow up with ID Clinic in [facility], MD 08/06/2017, 12:42 PM

## 2017-08-06 NOTE — Progress Notes (Signed)
Recreation Therapy Notes   Date: 11.09.18  Time: 9:30 am  Location: Craft Room  Behavioral response: N/A  Intervention Topic: Communication  Discussion/Intervention: Patient did not attend group.   Clinical Observations/Feedback:  Patient did not attend group.  Edwin Martinez LRT/CTRS         Edwin Martinez 08/06/2017 2:31 PM 

## 2017-08-07 DIAGNOSIS — F1220 Cannabis dependence, uncomplicated: Secondary | ICD-10-CM

## 2017-08-07 DIAGNOSIS — F1414 Cocaine abuse with cocaine-induced mood disorder: Secondary | ICD-10-CM

## 2017-08-07 LAB — BASIC METABOLIC PANEL
ANION GAP: 6 (ref 5–15)
BUN: 10 mg/dL (ref 6–20)
CALCIUM: 9.1 mg/dL (ref 8.9–10.3)
CO2: 24 mmol/L (ref 22–32)
Chloride: 107 mmol/L (ref 101–111)
Creatinine, Ser: 0.97 mg/dL (ref 0.61–1.24)
GLUCOSE: 96 mg/dL (ref 65–99)
POTASSIUM: 4.1 mmol/L (ref 3.5–5.1)
Sodium: 137 mmol/L (ref 135–145)

## 2017-08-07 NOTE — Progress Notes (Signed)
D: Pt spent majority of the evening in the bed A: Pt was offered support and encouragement. Pt was given scheduled medications. Pt was encourage to attend groups. Q 15 minute checks were done for safety.  R: safety maintained on unit.

## 2017-08-07 NOTE — Plan of Care (Signed)
Pt passive SI-contracts for safety

## 2017-08-07 NOTE — Progress Notes (Signed)
Patient is alert and oriented to self and place. Patient endorses passive SI and is able to verbally contract for safety. Patient noted to be up in the day room interacting selectively with his peers. Concentration is good, appetite good and reports sleeping good last night. Denies pain and AH at this time, states, "They come and they go." Patient is compliant with his medications and states that one of his goals today is to listen to the doctor and do what is told of him. Milieu remains safe with q 15 minute safety checks.

## 2017-08-07 NOTE — Plan of Care (Signed)
Patient is alert and oriented to self and place. Patient endorses passive SI and is able to verbally contract for safety. Patient noted to be up in the day room interacting selectively with his peers. Concentration is good, appetite good and reports sleeping good last night. Denies pain and AH at this time, states, "They come and they go." Patient is compliant with his medications and states that one of his goals today is to listen to the doctor and do what is told of him. Milieu remains safe with q 15 minute safety checks.   

## 2017-08-07 NOTE — Progress Notes (Signed)
[facility] MD Progress Note  08/07/2017 4:23 PM Edwin Martinez  MRN:  [id number]  Subjective:     The patient reports that he is still having some auditory hallucinations telling him that he is worthless. He says the voices "come and go" and he does have also intermittent passive suicidal thoughts. He says he had a plan to jump from the car prior to admission and that would be his plan if discharged. The patient denies any current active suicidal thoughts and is able to contract for safety on the unit. He says he only feels about 5-10% better overall. He minimizes substance use and is not interested in any substance abuse treatment. He has been fairly isolative to his room this morning remains hopeless about his living situation after discharge. He did sleep over 7 hours last night. Vital signs are stable. He denies any somatic complaints. Times spent reviewing blood work including elevated liver enzymes with the patient.   Past psychiatric history. Diagnosed with bipolar in 2001. Multiple admissions for substance use and suicidal ideation. One suicide attempt. Long history of substance use.  Family psychiatric history. Mother with bipolar, grandmothet and two uncles committed suicide.  Social history. He is homeless and uninsured. Goes to ID clinic for HIV treatment inconsistently. Reports good CD4 level and no virus. Has no support in the community.   Principal Problem: Schizoaffective disorder, bipolar type ([place]) Diagnosis:   Patient Active Problem List   Diagnosis Date Noted  . Schizoaffective disorder, bipolar type ([place]) [F25.0] 12/23/2016    Priority: High  . Cocaine abuse with cocaine-induced mood disorder ([place]) [F14.14] 03/30/2017  . Herpes zoster [B02.9] 03/06/2017  . Polysubstance abuse ([place]) [F19.10] 03/06/2017  . Homelessness [Z59.0] 03/06/2017  . Suicidal ideation [R45.851]   . Cannabis use disorder, moderate, dependence ([place]) [F12.20] 07/27/2016  . Tobacco use disorder  [F17.200] 07/27/2016  . Intentional drug overdose ([place]) [T50.902A] 07/18/2016  . Asthma [J45.909] 05/20/2007  . HIV disease ([place]) [B20] 05/05/2007   Total Time spent with patient: 20 minutes  Past Psychiatric History: mood instability, substance abuse.  Past Medical History:  Past Medical History:  Diagnosis Date  . ADHD (attention deficit hyperactivity disorder) 09/12/2012  . Anxiety   . Asthma   . Bipolar 1 disorder ([place])   . HIV (human immunodeficiency virus infection) ([place])   . Hypertension   . Schizophrenia ([place])   . Seizures ([place])     Past Surgical History:  Procedure Laterality Date  . DENTAL SURGERY     Family History:  Family History  Problem Relation Age of Onset  . Huntington's disease Father   . Heart disease Mother   . Suicidality Maternal Uncle   . Suicidality Maternal Grandmother    Family Psychiatric  History: Mother with bipolar, three completed suicides. Social History:  Social History   Substance and Sexual Activity  Alcohol Use Yes  . Alcohol/week: 1.2 oz  . Types: 2 Standard drinks or equivalent per week   Comment: little recently     Social History   Substance and Sexual Activity  Drug Use Yes  . Types: Cocaine, Marijuana   Comment: smoked marijuana 1-2 days ago    Social History   Socioeconomic History  . Marital status: Single    Spouse name: None  . Number of children: None  . Years of education: None  . Highest education level: None  Social Needs  . Financial resource strain: None  . Food insecurity - worry: None  . Food insecurity -  inability: None  . Transportation needs - medical: None  . Transportation needs - non-medical: None  Occupational History  . Occupation: disability pending  Tobacco Use  . Smoking status: Current Every Day Smoker    Packs/day: 0.50    Types: Cigarettes    Start date: 09/29/1991  . Smokeless tobacco: Never Used  Substance and Sexual Activity  . Alcohol use: Yes    Alcohol/week: 1.2 oz     Types: 2 Standard drinks or equivalent per week    Comment: little recently  . Drug use: Yes    Types: Cocaine, Marijuana    Comment: smoked marijuana 1-2 days ago  . Sexual activity: Yes    Birth control/protection: Condom  Other Topics Concern  . None  Social History Narrative   ** Merged History Encounter **         Sleep: Fair  Appetite:  Fair  Current Medications: Current Facility-Administered Medications  Medication Dose Route Frequency Provider Last Rate Last Dose  . acetaminophen (TYLENOL) tablet 650 mg  650 mg Oral Q6H PRN Pucilowska, Jolanta B, MD      . bictegravir-emtricitabine-tenofovir AF (BIKTARVY) 50-200-25 MG per tablet 1 tablet  1 tablet Oral Daily Pucilowska, Jolanta B, MD   1 tablet at 08/07/17 0811  . busPIRone (BUSPAR) tablet 10 mg  10 mg Oral BID Pucilowska, Jolanta B, MD   10 mg at 08/07/17 1619  . citalopram (CELEXA) tablet 20 mg  20 mg Oral Daily Pucilowska, Jolanta B, MD   20 mg at 08/07/17 0812  . feeding supplement (ENSURE ENLIVE) (ENSURE ENLIVE) liquid 237 mL  237 mL Oral TID BM Pucilowska, Jolanta B, MD   237 mL at 08/07/17 1352  . gabapentin (NEURONTIN) capsule 300 mg  300 mg Oral TID Pucilowska, Jolanta B, MD   300 mg at 08/07/17 1619  . hydrOXYzine (ATARAX/VISTARIL) tablet 50 mg  50 mg Oral TID PRN Pucilowska, Jolanta B, MD      . nicotine (NICODERM CQ - dosed in mg/24 hours) patch 21 mg  21 mg Transdermal Q0600 Pucilowska, Jolanta B, MD      . risperiDONE (RISPERDAL) tablet 3 mg  3 mg Oral BID Pucilowska, Jolanta B, MD   3 mg at 08/07/17 1619  . traZODone (DESYREL) tablet 100 mg  100 mg Oral QHS Pucilowska, Jolanta B, MD   100 mg at 08/06/17 2206    Lab Results:  Results for orders placed or performed during the hospital encounter of 08/04/17 (from the past 48 hour(s))  Basic metabolic panel     Status: None   Collection Time: 08/07/17  9:41 AM  Result Value Ref Range   Sodium 137 135 - 145 mmol/L   Potassium 4.1 3.5 - 5.1 mmol/L   Chloride  107 101 - 111 mmol/L   CO2 24 22 - 32 mmol/L   Glucose, Bld 96 65 - 99 mg/dL   BUN 10 6 - 20 mg/dL   Creatinine, Ser 0.97 0.61 - 1.24 mg/dL   Calcium 9.1 8.9 - 10.3 mg/dL   GFR calc non Af Amer >60 >60 mL/min   GFR calc Af Amer >60 >60 mL/min    Comment: (NOTE) The eGFR has been calculated using the CKD EPI equation. This calculation has not been validated in all clinical situations. eGFR's persistently <60 mL/min signify possible Chronic Kidney Disease.    Anion gap 6 5 - 15    Blood Alcohol level:  Lab Results  Component Value Date   ETH <10  08/04/2017   ETH 7 (H) 75/17/0017    Metabolic Disorder Labs: Lab Results  Component Value Date   HGBA1C 5.6 12/24/2016   MPG 114 12/24/2016   MPG 111 05/04/2016   Lab Results  Component Value Date   PROLACTIN 28.8 (H) 12/24/2016   PROLACTIN 32.3 (H) 08/19/2016   Lab Results  Component Value Date   CHOL 128 12/24/2016   TRIG 130 12/24/2016   HDL 35 (L) 12/24/2016   CHOLHDL 3.7 12/24/2016   VLDL 26 12/24/2016   LDLCALC 67 12/24/2016   LDLCALC 75 08/19/2016    Physical Findings: AIMS: Facial and Oral Movements Muscles of Facial Expression: None, normal Lips and Perioral Area: None, normal Jaw: None, normal Tongue: None, normal,Extremity Movements Upper (arms, wrists, hands, fingers): None, normal Lower (legs, knees, ankles, toes): None, normal, Trunk Movements Neck, shoulders, hips: None, normal, Overall Severity Severity of abnormal movements (highest score from questions above): None, normal Incapacitation due to abnormal movements: None, normal Patient's awareness of abnormal movements (rate only patient's report): No Awareness, Dental Status Current problems with teeth and/or dentures?: No(Per pt statement) Does patient usually wear dentures?: No  CIWA:  CIWA-Ar Total: 0 COWS:  COWS Total Score: 0  Musculoskeletal: Strength & Muscle Tone: within normal limits Gait & Station: normal Patient leans:  N/A  Psychiatric Specialty Exam: Physical Exam  Nursing note and vitals reviewed. Psychiatric: His speech is normal. His affect is blunt. He is slowed and withdrawn. Cognition and memory are normal. He expresses impulsivity. He exhibits a depressed mood. He expresses suicidal ideation. He expresses suicidal plans.    Review of Systems  Constitutional: Negative.   HENT: Negative.   Eyes: Negative.   Respiratory: Negative.   Cardiovascular: Negative.   Gastrointestinal: Negative.   Genitourinary: Negative.   Skin: Negative.   Neurological: Negative.   Endo/Heme/Allergies: Negative.   Psychiatric/Behavioral: Positive for depression, substance abuse and suicidal ideas.    Blood pressure 114/67, pulse 81, temperature 98.1 F (36.7 C), temperature source Oral, resp. rate 18, height 5' 7"  (1.702 m), weight 70.8 kg (156 lb), SpO2 98 %.Body mass index is 24.43 kg/m.  General Appearance: Fairly Groomed  Eye Contact:  Good  Speech:  Clear and Coherent  Volume:  Normal  Mood:  Depressed  Affect:  Flat  Thought Process:  Goal Directed and Descriptions of Associations: Intact  Orientation:  Full (Time, Place, and Person)  Thought Content:  WDL  Suicidal Thoughts:  Yes.  with intent/plan  Homicidal Thoughts:  No  Memory:  Immediate;   Fair Recent;   Fair Remote;   Fair  Judgement:  Poor  Insight:  Lacking  Psychomotor Activity:  Psychomotor Retardation  Concentration:  Concentration: Fair and Attention Span: Fair  Recall:  [employer] of Knowledge:  Fair  Language:  Fair  Akathisia:  No  Handed:  Right  AIMS (if indicated):     Assets:  Communication Skills Desire for Improvement Resilience  ADL's:  Intact  Cognition:  WNL  Sleep:  Number of Hours: 7.75     Treatment Plan Summary: Daily contact with patient to assess and evaluate symptoms and progress in treatment and Medication management   Mr. Quale is a 31 year old male with a history of bipolar illness and substance  abuse admitted after suicide attempt by stepping in front of a car in the context of treatment noncompliance, substance abuse and severe social stressors.   #Suicidal ideation -patient able to contract for safety in the hospital  #Mood  and psychosis -Restarted Risperdal 3 mg BID -Restarted Celexa 20 mg daily -Restarted BuSpar 10 mg BID -Depakote was restarted the patient has elevated liver enzymes. Will discontinue Depakote for now. Will check hepatitis panel.  #Insomnia -Trazodone 100 mg nightly  #Chronic pain -Neurontin 300 mg TID  #HIV -Continue Biktarvy. The patient is followed by ID  #Substance abuse -patient minimizes his problems and declines treatment -no symptoms of withdrawal -vital signs stable  #Metabolic syndrome monitoring -lipid panel, TSH and HgbA1C done on 12/23/2016 -EKG QTc 392  #Weight loss -will offer Ensure  #Disposition -discharge to the homeless shelter in [place] -Follow up at [facility] -Follow up with ID Clinic in [facility], MD 08/07/2017, 4:23 PM

## 2017-08-07 NOTE — [facility] Group Notes (Signed)
[facility] Group Notes:  (Nursing/MHT/Case Management/Adjunct)  Date:  08/07/2017  Time:  9:36 PM  Type of Therapy:  Group Therapy  Participation Level:  Did Not Attend  Participation QualitSummary of Progress/Problems:  Edwin NeerJackie L [name] 08/07/2017, 9:36 PM

## 2017-08-07 NOTE — [facility] Group Notes (Signed)
[facility] Group Notes:  (Nursing/MHT/Case Management/Adjunct)  Date:  08/07/2017  Time:  6:06 AM  Type of Therapy:  Psychoeducational Skills  Participation Level:  Did Not Attend  Summary of Progress/Problems:  Edwin MilroyLaquanda Y Sorin Frimpong 08/07/2017, 6:06 AM

## 2017-08-07 NOTE — [facility] Group Notes (Signed)
08/07/2017 1:15pm  Type of Therapy/Topic:  Group Therapy:  Balance in Life  Participation Level:  Patient was invited to group discussion, did not attend.    [name]  CUEBAS-COLON, LCSW-A 08/07/2017 12:58 PM  

## 2017-08-07 NOTE — Progress Notes (Signed)
Edwin Martinez spent the entire shift resting in bed quietly. He did not get up for snacks and medications, and he had no interaction with others. Writer had to take medication to patient after several attempts to encourage patient to get up and come to the medication room. No issues to report on shift at this time. He appears to be resting in bed quietly at this time.

## 2017-08-08 NOTE — Plan of Care (Signed)
Pt safe on the unit at this time 

## 2017-08-08 NOTE — [facility] Group Notes (Signed)
08/08/2017 1:15am  Type of Therapy and Topic:  Group Therapy:  Cognitive Distortions  Participation Level:  Did Not Attend    Johnnye SimaNNIA  CUEBAS-COLON, LCSWA 08/08/2017 2:38 PM

## 2017-08-08 NOTE — Progress Notes (Signed)
[facility] MD Progress Note  08/08/2017 4:46 PM [place]  MRN:  [id number]  Subjective:     The patient reports that the auditory hallucinations are still telling him that he is "worthless" and that nobody cares about him. He says the voices have decreased since yesterday but are still present. He denies any significant changes in terms of his mood and still feels depressed and has high anxiety triggered by homelessness. The patient says he has a father who lives in [place] that he cannot live with them. He worries about where he will go after discharge. He is also depressed about the fact that he will have to spend holidays without his family as most of his family is now deceased. He denies any active or passive suicidal thoughts. No visual hallucinations. He tends to minimize substance use and is not interested in any substance abuse treatment after discharge. He has been isolative to his room other than meals and does not attend groups. He denies any somatic complaints.  Nursing he slept 7 hours last night. Vital signs are stable.   Past psychiatric history. Diagnosed with bipolar in 2001. Multiple admissions for substance use and suicidal ideation. One suicide attempt. Long history of substance use.  Family psychiatric history. Mother with bipolar, grandmothet and two uncles committed suicide.  Social history. He is homeless and uninsured. Goes to ID clinic for HIV treatment inconsistently. Reports good CD4 level and no virus. Has no support in the community.   Principal Problem: Schizoaffective disorder, bipolar type ([place]) Diagnosis:   Patient Active Problem List   Diagnosis Date Noted  . Schizoaffective disorder, bipolar type ([place]) [F25.0] 12/23/2016    Priority: High  . Cocaine abuse with cocaine-induced mood disorder ([place]) [F14.14] 03/30/2017  . Herpes zoster [B02.9] 03/06/2017  . Polysubstance abuse ([place]) [F19.10] 03/06/2017  . Homelessness [Z59.0] 03/06/2017  .  Suicidal ideation [R45.851]   . Cannabis use disorder, moderate, dependence ([place]) [F12.20] 07/27/2016  . Tobacco use disorder [F17.200] 07/27/2016  . Intentional drug overdose ([place]) [T50.902A] 07/18/2016  . Asthma [J45.909] 05/20/2007  . HIV disease ([place]) [B20] 05/05/2007   Total Time spent with patient: 20 minutes  Past Psychiatric History: mood instability, substance abuse.  Past Medical History:  Past Medical History:  Diagnosis Date  . ADHD (attention deficit hyperactivity disorder) 09/12/2012  . Anxiety   . Asthma   . Bipolar 1 disorder ([place])   . HIV (human immunodeficiency virus infection) ([place])   . Hypertension   . Schizophrenia ([place])   . Seizures ([place])     Past Surgical History:  Procedure Laterality Date  . DENTAL SURGERY     Family History:  Family History  Problem Relation Age of Onset  . Huntington's disease Father   . Heart disease Mother   . Suicidality Maternal Uncle   . Suicidality Maternal Grandmother    Family Psychiatric  History: Mother with bipolar, three completed suicides. Social History:  Social History   Substance and Sexual Activity  Alcohol Use Yes  . Alcohol/week: 1.2 oz  . Types: 2 Standard drinks or equivalent per week   Comment: little recently     Social History   Substance and Sexual Activity  Drug Use Yes  . Types: Cocaine, Marijuana   Comment: smoked marijuana 1-2 days ago    Social History   Socioeconomic History  . Marital status: Single    Spouse name: None  . Number of children: None  . Years of education: None  . Highest  education level: None  Social Needs  . Financial resource strain: None  . Food insecurity - worry: None  . Food insecurity - inability: None  . Transportation needs - medical: None  . Transportation needs - non-medical: None  Occupational History  . Occupation: disability pending  Tobacco Use  . Smoking status: Current Every Day Smoker    Packs/day: 0.50    Types: Cigarettes    Start date:  09/29/1991  . Smokeless tobacco: Never Used  Substance and Sexual Activity  . Alcohol use: Yes    Alcohol/week: 1.2 oz    Types: 2 Standard drinks or equivalent per week    Comment: little recently  . Drug use: Yes    Types: Cocaine, Marijuana    Comment: smoked marijuana 1-2 days ago  . Sexual activity: Yes    Birth control/protection: Condom  Other Topics Concern  . None  Social History Narrative   ** Merged History Encounter **         Sleep: Fair  Appetite:  Fair  Current Medications: Current Facility-Administered Medications  Medication Dose Route Frequency Provider Last Rate Last Dose  . acetaminophen (TYLENOL) tablet 650 mg  650 mg Oral Q6H PRN Pucilowska, Jolanta B, MD      . bictegravir-emtricitabine-tenofovir AF (BIKTARVY) 50-200-25 MG per tablet 1 tablet  1 tablet Oral Daily Pucilowska, Jolanta B, MD   1 tablet at 08/08/17 0809  . busPIRone (BUSPAR) tablet 10 mg  10 mg Oral BID Pucilowska, Jolanta B, MD   10 mg at 08/08/17 0809  . citalopram (CELEXA) tablet 20 mg  20 mg Oral Daily Pucilowska, Jolanta B, MD   20 mg at 08/08/17 0808  . feeding supplement (ENSURE ENLIVE) (ENSURE ENLIVE) liquid 237 mL  237 mL Oral TID BM Pucilowska, Jolanta B, MD   237 mL at 08/07/17 1352  . gabapentin (NEURONTIN) capsule 300 mg  300 mg Oral TID Pucilowska, Jolanta B, MD   300 mg at 08/08/17 0809  . hydrOXYzine (ATARAX/VISTARIL) tablet 50 mg  50 mg Oral TID PRN Pucilowska, Jolanta B, MD      . risperiDONE (RISPERDAL) tablet 3 mg  3 mg Oral BID Pucilowska, Jolanta B, MD   3 mg at 08/08/17 0808  . traZODone (DESYREL) tablet 100 mg  100 mg Oral QHS Pucilowska, Jolanta B, MD   100 mg at 08/06/17 2206    Lab Results:  Results for orders placed or performed during the hospital encounter of 08/04/17 (from the past 48 hour(s))  Basic metabolic panel     Status: None   Collection Time: 08/07/17  9:41 AM  Result Value Ref Range   Sodium 137 135 - 145 mmol/L   Potassium 4.1 3.5 - 5.1 mmol/L    Chloride 107 101 - 111 mmol/L   CO2 24 22 - 32 mmol/L   Glucose, Bld 96 65 - 99 mg/dL   BUN 10 6 - 20 mg/dL   Creatinine, Ser 0.97 0.61 - 1.24 mg/dL   Calcium 9.1 8.9 - 10.3 mg/dL   GFR calc non Af Amer >60 >60 mL/min   GFR calc Af Amer >60 >60 mL/min    Comment: (NOTE) The eGFR has been calculated using the CKD EPI equation. This calculation has not been validated in all clinical situations. eGFR's persistently <60 mL/min signify possible Chronic Kidney Disease.    Anion gap 6 5 - 15    Blood Alcohol level:  Lab Results  Component Value Date   ETH <10 08/04/2017  ETH 7 (H) 47/05/6282    Metabolic Disorder Labs: Lab Results  Component Value Date   HGBA1C 5.6 12/24/2016   MPG 114 12/24/2016   MPG 111 05/04/2016   Lab Results  Component Value Date   PROLACTIN 28.8 (H) 12/24/2016   PROLACTIN 32.3 (H) 08/19/2016   Lab Results  Component Value Date   CHOL 128 12/24/2016   TRIG 130 12/24/2016   HDL 35 (L) 12/24/2016   CHOLHDL 3.7 12/24/2016   VLDL 26 12/24/2016   LDLCALC 67 12/24/2016   LDLCALC 75 08/19/2016    Physical Findings: AIMS: Facial and Oral Movements Muscles of Facial Expression: None, normal Lips and Perioral Area: None, normal Jaw: None, normal Tongue: None, normal,Extremity Movements Upper (arms, wrists, hands, fingers): None, normal Lower (legs, knees, ankles, toes): None, normal, Trunk Movements Neck, shoulders, hips: None, normal, Overall Severity Severity of abnormal movements (highest score from questions above): None, normal Incapacitation due to abnormal movements: None, normal Patient's awareness of abnormal movements (rate only patient's report): No Awareness, Dental Status Current problems with teeth and/or dentures?: No(Per pt statement) Does patient usually wear dentures?: No  CIWA:  CIWA-Ar Total: 0 COWS:  COWS Total Score: 0  Musculoskeletal: Strength & Muscle Tone: within normal limits Gait & Station: normal Patient leans:  N/A  Psychiatric Specialty Exam: Physical Exam  Nursing note and vitals reviewed. Psychiatric: His speech is normal. His affect is blunt. He is slowed and withdrawn. Cognition and memory are normal. He expresses impulsivity. He exhibits a depressed mood. He expresses suicidal ideation. He expresses suicidal plans.    Review of Systems  Constitutional: Negative.   HENT: Negative.   Eyes: Negative.   Respiratory: Negative.   Cardiovascular: Negative.   Gastrointestinal: Negative.   Genitourinary: Negative.   Skin: Negative.   Neurological: Negative.   Endo/Heme/Allergies: Negative.   Psychiatric/Behavioral: Positive for depression, substance abuse and suicidal ideas.    Blood pressure 100/76, pulse 88, temperature 98.3 F (36.8 C), resp. rate 18, height 5' 7"  (1.702 m), weight 70.8 kg (156 lb), SpO2 98 %.Body mass index is 24.43 kg/m.  General Appearance: Fairly Groomed  Eye Contact:  Good  Speech:  Clear and Coherent and Normal Rate  Volume:  Normal  Mood:  Depressed  Affect:  Restricted  Thought Process:  Goal Directed and Descriptions of Associations: Intact  Orientation:  Full (Time, Place, and Person)  Thought Content:  WDL  Suicidal Thoughts:  Yes.  with intent/plan  Homicidal Thoughts:  No  Memory:  Immediate;   Fair Recent;   Fair Remote;   Fair  Judgement:  Poor  Insight:  Lacking  Psychomotor Activity:  Normal  Concentration:  Concentration: Fair and Attention Span: Fair  Recall:  [employer] of Knowledge:  Fair  Language:  Fair  Akathisia:  No  Handed:  Right  AIMS (if indicated):     Assets:  Communication Skills Desire for Improvement Resilience  ADL's:  Intact  Cognition:  WNL  Sleep:  Number of Hours: 7     Treatment Plan Summary: Daily contact with patient to assess and evaluate symptoms and progress in treatment and Medication management   Mr. Westmoreland is a 31 year old male with a history of bipolar illness and substance abuse admitted after  suicide attempt by stepping in front of a car in the context of treatment noncompliance, substance abuse and severe social stressors.   #Suicidal ideation -patient able to contract for safety in the hospital -No current suicidal thoughts and auditory  hallucinations have decreased.  #Mood and psychosis -Auditory hallucinations have decreased since admission. -Restarted Risperdal 3 mg BID -Restarted Celexa 20 mg daily -Restarted BuSpar 10 mg BID -Depakote was restarted the patient has elevated liver enzymes. Will discontinue Depakote for now. Will recheck LFTs tomorrow to make sure they are trending down  #Insomnia -Will continue Trazodone 100 mg nightly  #Chronic pain -We'll continue Neurontin 300 mg TID  #HIV -Continue Biktarvy. The patient is followed by ID  #Substance abuse --Patient minimizes substance use and is not he says these treatment -He was advised to abstain from alcohol and all illicit drugs as they may worsen mood symptoms. -Vital signs are stable and he denies any signs of withdrawal.   #Metabolic syndrome monitoring -lipid panel, TSH and HgbA1C done on 12/23/2016 -Hemoglobin A1c was 5.6 and total cholesterol was less than 200 -EKGshowed a QTc interval of 392.  #Weight loss -will offer Ensure  #Disposition -discharge to the homeless shelter in [place] -Follow up at [facility] -Follow up with [facility] in [facility], MD 08/08/2017, 4:46 PM

## 2017-08-08 NOTE — Progress Notes (Signed)
Received [name] this am after breakfast,he was compliant with his medications. He endorsed hearing voices at intervals, passive SI at intervals without a plan and rated his depression and anxiety 5/10. He slept the entire day except for his meals. After dinner he remained in the milieu socializing with select peers.

## 2017-08-09 LAB — COMPREHENSIVE METABOLIC PANEL
ALK PHOS: 91 U/L (ref 38–126)
ALT: 196 U/L — AB (ref 17–63)
AST: 108 U/L — AB (ref 15–41)
Albumin: 4 g/dL (ref 3.5–5.0)
Anion gap: 9 (ref 5–15)
BUN: 16 mg/dL (ref 6–20)
CALCIUM: 9.1 mg/dL (ref 8.9–10.3)
CHLORIDE: 105 mmol/L (ref 101–111)
CO2: 23 mmol/L (ref 22–32)
CREATININE: 0.79 mg/dL (ref 0.61–1.24)
GFR calc non Af Amer: >60 mL/min (ref >60)
Glucose, Bld: 148 mg/dL — ABNORMAL HIGH (ref 65–99)
Potassium: 3.9 mmol/L (ref 3.5–5.1)
SODIUM: 137 mmol/L (ref 135–145)
Total Bilirubin: 0.6 mg/dL (ref 0.3–1.2)
Total Protein: 7.6 g/dL (ref 6.5–8.1)

## 2017-08-09 LAB — HEPATITIS PANEL, ACUTE
HCV Ab: >11 s/co ratio — ABNORMAL HIGH (ref 0.0–0.9)
HEP B C IGM: NEGATIVE
HEP B S AG: NEGATIVE
Hep A IgM: NEGATIVE

## 2017-08-09 NOTE — Progress Notes (Signed)
Patient denies SI, HI and AVH. Patient did attend two groups today, and interacted more in the dayroom with his peers during meal times. Patient is appropriate to staff and peers on the unit. Patient became very anxious this evening as evidenced by body movements, pacing and scratching, very fidgety therefore  vistaril was administered.

## 2017-08-09 NOTE — Plan of Care (Signed)
Patient attended groups today, and is compliant with medications. Patient mood today is anxious and irritable. Patient denies HI, SI and hallucinations.

## 2017-08-09 NOTE — Progress Notes (Signed)
Recreation Therapy Notes  Date: 11.12.18  Time: 3:00pm  Location: Craft room  Behavioral response: N/A  Group Type: Craft  Participation level: N/A  Communication: Patient did not attend group.  Comments: Patient did not attend group.  Siris Hoos LRT/CTRS        Franciscojavier Wronski 08/09/2017 4:04 PM 

## 2017-08-09 NOTE — [facility] Group Notes (Signed)
LCSW Group Therapy Note   08/09/2017 9:30am   Type of Therapy and Topic:  Group Therapy:  Overcoming Obstacles   Participation Level:  Active   Description of Group:    In this group patients will be encouraged to explore what they see as obstacles to their own wellness and recovery. They will be guided to discuss their thoughts, feelings, and behaviors related to these obstacles. The group will process together ways to cope with barriers, with attention given to specific choices patients can make. Each patient will be challenged to identify changes they are motivated to make in order to overcome their obstacles. This group will be process-oriented, with patients participating in exploration of their own experiences as well as giving and receiving support and challenge from other group members.   Therapeutic Goals: 1. Patient will identify personal and current obstacles as they relate to admission. 2. Patient will identify barriers that currently interfere with their wellness or overcoming obstacles.  3. Patient will identify feelings, thought process and behaviors related to these barriers. 4. Patient will identify two changes they are willing to make to overcome these obstacles:      Summary of Patient Progress   Pt able to meet therapeutic goals listed above and shares that he is tiring of the lifestyle that his drug use has led him to. He shares guilt and shame over some of the choices he has made to support his habit.  Group was supportive and CSW talked about nature of disease process and importance of addressing guilt and shame in order to move on from relapse and be in recovery.   Therapeutic Modalities:   Cognitive Behavioral Therapy Solution Focused Therapy Motivational Interviewing Relapse Prevention Therapy  Glennon MacSara P Tazaria Dlugosz, LCSW 08/09/2017 2:19 PM

## 2017-08-09 NOTE — Progress Notes (Signed)
Recreation Therapy Notes  Date: 11.12.18  Time: 1:00pm  Location: Craft Room  Behavioral response: N/A  Intervention Topic: Creative Expressions  Discussion/Intervention: Patient did not attend group.  Clinical Observations/Feedback:  Patient did not attend group.  Enma Maeda LRT/CTRS           Kenya Shiraishi 08/09/2017 2:01 PM

## 2017-08-09 NOTE — Progress Notes (Addendum)
[facility] MD Progress Note  08/09/2017 12:39 PM [place]  MRN:  [id number]  Subjective:   Edwin Martinez reports no suicidal thoughts since yesterday but feels rather hopeless. No side effects from medications, no somatic complaints.  Treatment plan.  1. Schizoaffective disorder. We will continue Risperdal 3 mg BID, BuSpar 10 mg BID, Celexa  20 mg daily and Neurontin 300 mg TID.    2. HIV. Continue Biktarvy    3. Patient declines residential substance abuse treatment.  Social/disposition. The patient is homeless. He will follow up with [facility] in [place] and [facility] there.  Principal Problem: Schizoaffective disorder, bipolar type ([place]) Diagnosis:   Patient Active Problem List   Diagnosis Date Noted  . Cocaine abuse with cocaine-induced mood disorder ([place]) [F14.14] 03/30/2017  . Herpes zoster [B02.9] 03/06/2017  . Polysubstance abuse ([place]) [F19.10] 03/06/2017  . Homelessness [Z59.0] 03/06/2017  . Schizoaffective disorder, bipolar type ([place]) [F25.0] 12/23/2016  . Suicidal ideation [R45.851]   . Cannabis use disorder, moderate, dependence ([place]) [F12.20] 07/27/2016  . Tobacco use disorder [F17.200] 07/27/2016  . Intentional drug overdose ([place]) [T50.902A] 07/18/2016  . Asthma [J45.909] 05/20/2007  . HIV disease ([place]) [B20] 05/05/2007   Total Time spent with patient: 20 minutes  Past Psychiatric History: bipolar duisorder, substance abuse.  Past Medical History:  Past Medical History:  Diagnosis Date  . ADHD (attention deficit hyperactivity disorder) 09/12/2012  . Anxiety   . Asthma   . Bipolar 1 disorder ([place])   . HIV (human immunodeficiency virus infection) ([place])   . Hypertension   . Schizophrenia ([place])   . Seizures ([place])     Past Surgical History:  Procedure Laterality Date  . DENTAL SURGERY     Family History:  Family History  Problem Relation Age of Onset  . Huntington's disease Father   . Heart disease Mother   . Suicidality Maternal Uncle   .  Suicidality Maternal Grandmother    Family Psychiatric  History: mother with bipolar, multiple completed suicides in the family  Social History:  Social History   Substance and Sexual Activity  Alcohol Use Yes  . Alcohol/week: 1.2 oz  . Types: 2 Standard drinks or equivalent per week   Comment: little recently     Social History   Substance and Sexual Activity  Drug Use Yes  . Types: Cocaine, Marijuana   Comment: smoked marijuana 1-2 days ago    Social History   Socioeconomic History  . Marital status: Single    Spouse name: None  . Number of children: None  . Years of education: None  . Highest education level: None  Social Needs  . Financial resource strain: None  . Food insecurity - worry: None  . Food insecurity - inability: None  . Transportation needs - medical: None  . Transportation needs - non-medical: None  Occupational History  . Occupation: disability pending  Tobacco Use  . Smoking status: Current Every Day Smoker    Packs/day: 0.50    Types: Cigarettes    Start date: 09/29/1991  . Smokeless tobacco: Never Used  Substance and Sexual Activity  . Alcohol use: Yes    Alcohol/week: 1.2 oz    Types: 2 Standard drinks or equivalent per week    Comment: little recently  . Drug use: Yes    Types: Cocaine, Marijuana    Comment: smoked marijuana 1-2 days ago  . Sexual activity: Yes    Birth control/protection: Condom  Other Topics Concern  . None  Social  History Narrative   ** Merged History Encounter **       Additional Social History:                         Sleep: Fair  Appetite:  Poor  Current Medications: Current Facility-Administered Medications  Medication Dose Route Frequency Provider Last Rate Last Dose  . acetaminophen (TYLENOL) tablet 650 mg  650 mg Oral Q6H PRN Jenness Stemler B, MD      . bictegravir-emtricitabine-tenofovir AF (BIKTARVY) 50-200-25 MG per tablet 1 tablet  1 tablet Oral Daily Lionardo Haze B, MD   1  tablet at 08/09/17 0858  . busPIRone (BUSPAR) tablet 10 mg  10 mg Oral BID Ladell Lea B, MD   10 mg at 08/09/17 0858  . citalopram (CELEXA) tablet 20 mg  20 mg Oral Daily Antha Niday B, MD   20 mg at 08/09/17 0900  . feeding supplement (ENSURE ENLIVE) (ENSURE ENLIVE) liquid 237 mL  237 mL Oral TID BM Shameika Speelman B, MD   237 mL at 08/09/17 1037  . gabapentin (NEURONTIN) capsule 300 mg  300 mg Oral TID Seibert Keeter B, MD   300 mg at 08/09/17 0858  . hydrOXYzine (ATARAX/VISTARIL) tablet 50 mg  50 mg Oral TID PRN Geni Skorupski B, MD      . risperiDONE (RISPERDAL) tablet 3 mg  3 mg Oral BID Reathel Turi B, MD   3 mg at 08/09/17 0858  . traZODone (DESYREL) tablet 100 mg  100 mg Oral QHS Thelmer Legler B, MD   100 mg at 08/06/17 2206    Lab Results:  Results for orders placed or performed during the hospital encounter of 08/04/17 (from the past 48 hour(s))  Comprehensive metabolic panel     Status: Abnormal   Collection Time: 08/09/17  6:54 AM  Result Value Ref Range   Sodium 137 135 - 145 mmol/L   Potassium 3.9 3.5 - 5.1 mmol/L    Comment: HEMOLYSIS AT THIS LEVEL MAY AFFECT RESULT   Chloride 105 101 - 111 mmol/L   CO2 23 22 - 32 mmol/L   Glucose, Bld 148 (H) 65 - 99 mg/dL   BUN 16 6 - 20 mg/dL   Creatinine, Ser 0.79 0.61 - 1.24 mg/dL   Calcium 9.1 8.9 - 10.3 mg/dL   Total Protein 7.6 6.5 - 8.1 g/dL   Albumin 4.0 3.5 - 5.0 g/dL   AST 108 (H) 15 - 41 U/L   ALT 196 (H) 17 - 63 U/L   Alkaline Phosphatase 91 38 - 126 U/L   Total Bilirubin 0.6 0.3 - 1.2 mg/dL   GFR calc non Af Amer >60 >60 mL/min   GFR calc Af Amer >60 >60 mL/min    Comment: (NOTE) The eGFR has been calculated using the CKD EPI equation. This calculation has not been validated in all clinical situations. eGFR's persistently <60 mL/min signify possible Chronic Kidney Disease.    Anion gap 9 5 - 15    Blood Alcohol level:  Lab Results  Component Value Date   ETH <10  08/04/2017   ETH 7 (H) 16/06/9603    Metabolic Disorder Labs: Lab Results  Component Value Date   HGBA1C 5.6 12/24/2016   MPG 114 12/24/2016   MPG 111 05/04/2016   Lab Results  Component Value Date   PROLACTIN 28.8 (H) 12/24/2016   PROLACTIN 32.3 (H) 08/19/2016   Lab Results  Component Value Date   CHOL 128  12/24/2016   TRIG 130 12/24/2016   HDL 35 (L) 12/24/2016   CHOLHDL 3.7 12/24/2016   VLDL 26 12/24/2016   LDLCALC 67 12/24/2016   LDLCALC 75 08/19/2016    Physical Findings: AIMS: Facial and Oral Movements Muscles of Facial Expression: None, normal Lips and Perioral Area: None, normal Jaw: None, normal Tongue: None, normal,Extremity Movements Upper (arms, wrists, hands, fingers): None, normal Lower (legs, knees, ankles, toes): None, normal, Trunk Movements Neck, shoulders, hips: None, normal, Overall Severity Severity of abnormal movements (highest score from questions above): None, normal Incapacitation due to abnormal movements: None, normal Patient's awareness of abnormal movements (rate only patient's report): No Awareness, Dental Status Current problems with teeth and/or dentures?: No(Per pt statement) Does patient usually wear dentures?: No  CIWA:  CIWA-Ar Total: 0 COWS:  COWS Total Score: 0  Musculoskeletal: Strength & Muscle Tone: within normal limits Gait & Station: normal Patient leans: N/A  Psychiatric Specialty Exam: Physical Exam  Nursing note and vitals reviewed. Psychiatric: His speech is normal. His affect is blunt. He is slowed and withdrawn. Cognition and memory are normal. He expresses impulsivity. He exhibits a depressed mood. He expresses suicidal ideation.    Review of Systems  Neurological: Negative.   Psychiatric/Behavioral: Positive for depression, substance abuse and suicidal ideas.  All other systems reviewed and are negative.   Blood pressure 122/75, pulse 68, temperature 98.2 F (36.8 C), resp. rate 18, height '5\' 7"'$  (1.702 m),  weight 70.8 kg (156 lb), SpO2 98 %.Body mass index is 24.43 kg/m.  General Appearance: Casual  Eye Contact:  Fair  Speech:  Clear and Coherent  Volume:  Decreased  Mood:  Depressed  Affect:  Flat  Thought Process:  Goal Directed and Descriptions of Associations: Intact  Orientation:  Full (Time, Place, and Person)  Thought Content:  Hallucinations: Auditory  Suicidal Thoughts:  No  Homicidal Thoughts:  No  Memory:  Immediate;   Fair Recent;   Fair Remote;   Fair  Judgement:  Poor  Insight:  Lacking  Psychomotor Activity:  Psychomotor Retardation  Concentration:  Concentration: Fair and Attention Span: Fair  Recall:  [employer] of Knowledge:  Fair  Language:  Fair  Akathisia:  No  Handed:  Right  AIMS (if indicated):     Assets:  Communication Skills Desire for Improvement Resilience  ADL's:  Intact  Cognition:  WNL  Sleep:  Number of Hours: 8.15     Treatment Plan Summary: Daily contact with patient to assess and evaluate symptoms and progress in treatment and Medication management   Edwin Martinez is a 31 year old male with a history of bipolar illness and substance abuse admitted after suicide attempt by stepping in front of a car in the context of treatment noncompliance, substance abuse and severe social stressors.   #Suicidal ideation, resolved -patient able to contract for safety in the hospital  #Mood and psychosis, improved -Continue Risperdal 3 mg BID -Continue Celexa 20 mg daily -Continue BuSpar 10 mg BID -Depakote was discontinue due to still elevated LFTs   #Insomnia -Continue Trazodone 100 mg nightly  #Chronic pain -Continue Neurontin 300 mg TID  #HIV -Continue Biktarvy, patient followed by ID -Hep C negative  #Substance abuse --Patient minimizes substance use and declines substance abuse treatment -He was advised to abstain from alcohol and all illicit drugs as they may worsen mood symptoms -Vital signs are stable and he denies any signs  of withdrawal -patient not interested in pharmacotherapy for addiction  #Metabolic syndrome monitoring -  lipid panel, TSH and HgbA1C done on 12/23/2016 -Hemoglobin A1c was 5.6 and total cholesterol was less than 200 -EKG, QTc 392  #Weight loss -continue Ensure  #Disposition -discharge to the homeless shelter in [place] to contact his case worker -Follow up at [facility] -Follow up with [facility] in [facility], MD 08/09/2017, 12:39 PM

## 2017-08-10 MED ORDER — PALIPERIDONE PALMITATE 234 MG/1.5ML IM SUSP
234.0000 mg | INTRAMUSCULAR | Status: DC
  Administered 2017-08-11: 234 mg via INTRAMUSCULAR
  Filled 2017-08-10: qty 1.5

## 2017-08-10 MED ORDER — OXCARBAZEPINE 300 MG PO TABS
300.0000 mg | ORAL_TABLET | Freq: Two times a day (BID) | ORAL | Status: DC
  Administered 2017-08-10 – 2017-08-12 (×4): 300 mg via ORAL
  Filled 2017-08-10 (×4): qty 1

## 2017-08-10 NOTE — [facility] Group Notes (Signed)
LCSW Group Therapy Note 08/10/2017 1:15pm  Type of Therapy and Topic:  Group Therapy:  Setting Goals  Participation Level:  Did Not Attend  Description of Group: In this process group, patients discussed using strengths to work toward goals and address challenges.  Patients identified two positive things about themselves and one goal they were working on.  Patients were given the opportunity to share openly and support each other's plan for self-empowerment.  The group discussed the value of gratitude and were encouraged to have a daily reflection of positive characteristics or circumstances.  Patients were encouraged to identify a plan to utilize their strengths to work on current challenges and goals.  Therapeutic Goals 1. Patient will verbalize personal strengths/positive qualities and relate how these can assist with achieving desired personal goals 2. Patients will verbalize affirmation of peers plans for personal change and goal setting 3. Patients will explore the value of gratitude and positive focus as related to successful achievement of goals 4. Patients will verbalize a plan for regular reinforcement of personal positive qualities and circumstances.  Summary of Patient Progress:       Therapeutic Modalities Cognitive Behavioral Therapy Motivational Interviewing    Glennon MacSara P Olyver Hawes, LCSW 08/10/2017 11:18 AM

## 2017-08-10 NOTE — Plan of Care (Signed)
Patient denies SI, HI, and AVH. Patient interacts with his peers in the dayroom during meals. Patient is pleasant to staff and remains medication compliant. Patient stated the trazodone helped him rest last night really well. Patient able to vocalize feelings to staff. Patient complains of anxiety and will verbalize to staff if vistaril is needed.

## 2017-08-10 NOTE — [facility] Group Notes (Signed)
[facility] Group Notes:  (Nursing/MHT/Case Management/Adjunct)  Date:  08/10/2017  Time:  2:59 PM  Type of Therapy:  Psychoeducational Skills  Participation Level:  Did Not Attend     Edwin Martinez 08/10/2017, 2:59 PM

## 2017-08-10 NOTE — Progress Notes (Signed)
Recreation Therapy Notes  Date: 11.13.18  Time: 1:00pm  Location: Craft Room  Behavioral response: N/A  Intervention Topic: Stress  Discussion/Intervention: Patient did not attend group. Clinical Observations/Feedback:  Patient did not attend group.  Seaton Hofmann LRT/CTRS         Yorley Buch 08/10/2017 4:23 PM 

## 2017-08-10 NOTE — Progress Notes (Signed)
[facility] MD Progress Note  08/10/2017 2:46 PM [place]  MRN:  [id number]  Subjective:   Edwin Martinez feels better but reports hallucinations. Mood is improving, affect is brighter. He tolerates medications well. We had to discontinue Depakote due to elevated LFTs. Denies heavy drinking, Hep C negative.  Treatment plan. We continue Risperdal 3 mg BID, BuSpar 10 mg BID, Celexa 20 mg daily and Neurontin 300 mg TID. We will start Trileptal for mood stabilization. He gets Airline [employer] for HIV.  Social/disposition. He is homeless, he minimizes substance abuse problems and declines residential treatment.  Principal Problem: Schizoaffective disorder, bipolar type ([place]) Diagnosis:   Patient Active Problem List   Diagnosis Date Noted  . Cocaine abuse with cocaine-induced mood disorder ([place]) [F14.14] 03/30/2017  . Herpes zoster [B02.9] 03/06/2017  . Polysubstance abuse ([place]) [F19.10] 03/06/2017  . Homelessness [Z59.0] 03/06/2017  . Schizoaffective disorder, bipolar type ([place]) [F25.0] 12/23/2016  . Suicidal ideation [R45.851]   . Cannabis use disorder, moderate, dependence ([place]) [F12.20] 07/27/2016  . Tobacco use disorder [F17.200] 07/27/2016  . Intentional drug overdose ([place]) [T50.902A] 07/18/2016  . Asthma [J45.909] 05/20/2007  . HIV disease ([place]) [B20] 05/05/2007   Total Time spent with patient: 20 minutes  Past Psychiatric History: schizoaffective disorder.  Past Medical History:  Past Medical History:  Diagnosis Date  . ADHD (attention deficit hyperactivity disorder) 09/12/2012  . Anxiety   . Asthma   . Bipolar 1 disorder ([place])   . HIV (human immunodeficiency virus infection) ([place])   . Hypertension   . Schizophrenia ([place])   . Seizures ([place])     Past Surgical History:  Procedure Laterality Date  . DENTAL SURGERY     Family History:  Family History  Problem Relation Age of Onset  . Huntington's disease Father   . Heart disease Mother   . Suicidality Maternal Uncle   .  Suicidality Maternal Grandmother    Family Psychiatric  History: mother with bipolar, several completed suicides in the family. Social History:  Social History   Substance and Sexual Activity  Alcohol Use Yes  . Alcohol/week: 1.2 oz  . Types: 2 Standard drinks or equivalent per week   Comment: little recently     Social History   Substance and Sexual Activity  Drug Use Yes  . Types: Cocaine, Marijuana   Comment: smoked marijuana 1-2 days ago    Social History   Socioeconomic History  . Marital status: Single    Spouse name: None  . Number of children: None  . Years of education: None  . Highest education level: None  Social Needs  . Financial resource strain: None  . Food insecurity - worry: None  . Food insecurity - inability: None  . Transportation needs - medical: None  . Transportation needs - non-medical: None  Occupational History  . Occupation: disability pending  Tobacco Use  . Smoking status: Current Every Day Smoker    Packs/day: 0.50    Types: Cigarettes    Start date: 09/29/1991  . Smokeless tobacco: Never Used  Substance and Sexual Activity  . Alcohol use: Yes    Alcohol/week: 1.2 oz    Types: 2 Standard drinks or equivalent per week    Comment: little recently  . Drug use: Yes    Types: Cocaine, Marijuana    Comment: smoked marijuana 1-2 days ago  . Sexual activity: Yes    Birth control/protection: Condom  Other Topics Concern  . None  Social History Narrative   ** Merged  History Encounter **       Additional Social History:                         Sleep: Fair  Appetite:  Fair  Current Medications: Current Facility-Administered Medications  Medication Dose Route Frequency Provider Last Rate Last Dose  . acetaminophen (TYLENOL) tablet 650 mg  650 mg Oral Q6H PRN Edwin Crotteau B, MD      . bictegravir-emtricitabine-tenofovir AF (BIKTARVY) 50-200-25 MG per tablet 1 tablet  1 tablet Oral Daily Edwin Carver B, MD   1  tablet at 08/10/17 0811  . busPIRone (BUSPAR) tablet 10 mg  10 mg Oral BID Edwin Biss B, MD   10 mg at 08/10/17 0811  . citalopram (CELEXA) tablet 20 mg  20 mg Oral Daily Edwin Royse B, MD   20 mg at 08/10/17 0811  . feeding supplement (ENSURE ENLIVE) (ENSURE ENLIVE) liquid 237 mL  237 mL Oral TID BM Edwin Lingenfelter B, MD   237 mL at 08/10/17 1428  . gabapentin (NEURONTIN) capsule 300 mg  300 mg Oral TID Edwin Mckeithan B, MD   300 mg at 08/10/17 0811  . hydrOXYzine (ATARAX/VISTARIL) tablet 50 mg  50 mg Oral TID PRN Edwin Ricciuti B, MD   50 mg at 08/10/17 0812  . Oxcarbazepine (TRILEPTAL) tablet 300 mg  300 mg Oral BID Edwin Pindell B, MD      . risperiDONE (RISPERDAL) tablet 3 mg  3 mg Oral BID Edwin Garriga B, MD   3 mg at 08/10/17 0811  . traZODone (DESYREL) tablet 100 mg  100 mg Oral QHS Edwin Coldren B, MD   100 mg at 08/09/17 2150    Lab Results:  Results for orders placed or performed during the hospital encounter of 08/04/17 (from the past 48 hour(s))  Comprehensive metabolic panel     Status: Abnormal   Collection Time: 08/09/17  6:54 AM  Result Value Ref Range   Sodium 137 135 - 145 mmol/L   Potassium 3.9 3.5 - 5.1 mmol/L    Comment: HEMOLYSIS AT THIS LEVEL MAY AFFECT RESULT   Chloride 105 101 - 111 mmol/L   CO2 23 22 - 32 mmol/L   Glucose, Bld 148 (H) 65 - 99 mg/dL   BUN 16 6 - 20 mg/dL   Creatinine, Ser 0.79 0.61 - 1.24 mg/dL   Calcium 9.1 8.9 - 10.3 mg/dL   Total Protein 7.6 6.5 - 8.1 g/dL   Albumin 4.0 3.5 - 5.0 g/dL   AST 108 (H) 15 - 41 U/L   ALT 196 (H) 17 - 63 U/L   Alkaline Phosphatase 91 38 - 126 U/L   Total Bilirubin 0.6 0.3 - 1.2 mg/dL   GFR calc non Af Amer >60 >60 mL/min   GFR calc Af Amer >60 >60 mL/min    Comment: (NOTE) The eGFR has been calculated using the CKD EPI equation. This calculation has not been validated in all clinical situations. eGFR's persistently <60 mL/min signify possible Chronic  Kidney Disease.    Anion gap 9 5 - 15    Blood Alcohol level:  Lab Results  Component Value Date   ETH <10 08/04/2017   ETH 7 (H) 47/42/5956    Metabolic Disorder Labs: Lab Results  Component Value Date   HGBA1C 5.6 12/24/2016   MPG 114 12/24/2016   MPG 111 05/04/2016   Lab Results  Component Value Date   PROLACTIN 28.8 (H) 12/24/2016  PROLACTIN 32.3 (H) 08/19/2016   Lab Results  Component Value Date   CHOL 128 12/24/2016   TRIG 130 12/24/2016   HDL 35 (L) 12/24/2016   CHOLHDL 3.7 12/24/2016   VLDL 26 12/24/2016   LDLCALC 67 12/24/2016   LDLCALC 75 08/19/2016    Physical Findings: AIMS: Facial and Oral Movements Muscles of Facial Expression: None, normal Lips and Perioral Area: None, normal Jaw: None, normal Tongue: None, normal,Extremity Movements Upper (arms, wrists, hands, fingers): None, normal Lower (legs, knees, ankles, toes): None, normal, Trunk Movements Neck, shoulders, hips: None, normal, Overall Severity Severity of abnormal movements (highest score from questions above): None, normal Incapacitation due to abnormal movements: None, normal Patient's awareness of abnormal movements (rate only patient's report): No Awareness, Dental Status Current problems with teeth and/or dentures?: No(Per pt statement) Does patient usually wear dentures?: No  CIWA:  CIWA-Ar Total: 0 COWS:  COWS Total Score: 0  Musculoskeletal: Strength & Muscle Tone: within normal limits Gait & Station: normal Patient leans: N/A  Psychiatric Specialty Exam: Physical Exam  Nursing note and vitals reviewed. Psychiatric: He has a normal mood and affect. His speech is normal. Judgment and thought content normal. He is actively hallucinating. Cognition and memory are normal.    Review of Systems  Neurological: Negative.   Psychiatric/Behavioral: Positive for hallucinations.  All other systems reviewed and are negative.   Blood pressure 134/77, pulse 71, temperature 98 F  (36.7 C), resp. rate 18, height _0  (1.702 m), weight 70.8 kg (156 lb), SpO2 98 %.Body mass index is 24.43 kg/m.  General Appearance: Casual  Eye Contact:  Good  Speech:  Clear and Coherent  Volume:  Normal  Mood:  Anxious  Affect:  Appropriate  Thought Process:  Goal Directed and Descriptions of Associations: Intact  Orientation:  Full (Time, Place, and Person)  Thought Content:  Hallucinations: Auditory  Suicidal Thoughts:  No  Homicidal Thoughts:  No  Memory:  Immediate;   Fair Recent;   Fair Remote;   Fair  Judgement:  Poor  Insight:  Lacking  Psychomotor Activity:  Normal  Concentration:  Concentration: Fair and Attention Span: Fair  Recall:  [employer] of Knowledge:  Fair  Language:  Fair  Akathisia:  No  Handed:  Right  AIMS (if indicated):     Assets:  Communication Skills Desire for Improvement Resilience  ADL's:  Intact  Cognition:  WNL  Sleep:  Number of Hours: 8.15     Treatment Plan Summary: Daily contact with patient to assess and evaluate symptoms and progress in treatment and Medication management   Edwin Martinez is a 31 year old male with a history of bipolar illness and substance abuse admitted after suicide attempt by stepping in front of a car in the context of treatment noncompliance, substance abuse and severe social stressors.   #Suicidal ideation, resolved -patient able to contract for safety in the hospital  #Mood and psychosis, improved -Continue Risperdal 3 mg BID -Continue Celexa 20 mg daily -Continue BuSpar 10 mg BID -Depakote was discontinue due to still elevated LFTs -start Trileptal 300 mg BID  #Insomnia -ContinueTrazodone 100 mg nightly  #Chronic pain -ContinueNeurontin 300 mg TID  #HIV -Continue Biktarvy, patient followed by ID -Hep C negative  #Substance abuse --Patient minimizes substance use and declines substance abuse treatment -He was advised to abstain from alcohol and all illicit drugs as they may worsen  mood symptoms -Vital signs are stable and he denies any signs of withdrawal -patient not interested in  pharmacotherapy for addiction  #Metabolic syndrome monitoring -lipid panel, TSH and HgbA1C done on 12/23/2016 -Hemoglobin A1c was 5.6 and total cholesterol was less than 200 -EKG, QTc 392  #Weight loss -continue Ensure  #Disposition -discharge to the homeless shelter in [place] to contact his case worker -Follow up at [facility] -Follow up with [facility] in [facility], MD 08/10/2017, 2:46 PM

## 2017-08-10 NOTE — Progress Notes (Signed)
Patient is denying any thoughts of harm to self and others, in bed with eyes closed resp. Regular during checks through out the night

## 2017-08-10 NOTE — [facility] Group Notes (Signed)
[facility] Group Notes:  (Nursing/MHT/Case Management/Adjunct)  Date:  08/10/2017  Time:  11:02 PM  Type of Therapy:  Evening Wrap-up Group  Participation Level:  Did Not Attend  Participation Quality:  N/A  Affect:  N/A  Cognitive:  N/A  Insight:  None  Engagement in Group:  Did Not Attend  Modes of Intervention:  Activity and Discussion  Summary of Progress/Problems:  Tomasita MorrowChelsea Nanta Brandye Inthavong 08/10/2017, 11:02 PM

## 2017-08-10 NOTE — [facility] Group Notes (Signed)
LCSW Group Therapy Note  08/10/2017 1:15pm  Type of Therapy/Topic:  Group Therapy:  Feelings about Diagnosis  Participation Level:  Did Not Attend   Description of Group:   This group will allow patients to explore their thoughts and feelings about diagnoses they have received. Patients will be guided to explore their level of understanding and acceptance of these diagnoses. Facilitator will encourage patients to process their thoughts and feelings about the reactions of others to their diagnosis and will guide patients in identifying ways to discuss their diagnosis with significant others in their lives. This group will be process-oriented, with patients participating in exploration of their own experiences, giving and receiving support, and processing challenge from other group members.   Therapeutic Goals: 1. Patient will demonstrate understanding of diagnosis as evidenced by identifying two or more symptoms of the disorder 2. Patient will be able to express two feelings regarding the diagnosis 3. Patient will demonstrate their ability to communicate their needs through discussion and/or role play  Summary of Patient Progress:       Therapeutic Modalities:   Cognitive Behavioral Therapy Brief Therapy Feelings Identification    Edwin FrameSonya Martinez Edwin Castagna, LCSW 08/10/2017 11:23 AM

## 2017-08-10 NOTE — Progress Notes (Signed)
Recreation Therapy Notes  Date: 11.13.18  Time: 3:00pm  Location: Craft room  Behavioral response: N/A  Group Type: Craft  Participation level: N/A  Communication: Patient did not attend group.  Comments: Patient did not attend group.  Jerolene Kupfer LRT/CTRS        Kline Bulthuis 08/10/2017 4:27 PM

## 2017-08-11 MED ORDER — TRAZODONE HCL 100 MG PO TABS: 100.0000 mg | ORAL_TABLET | Freq: Every day | ORAL | 1 refills | Status: DC

## 2017-08-11 MED ORDER — BUSPIRONE HCL 5 MG PO TABS: 10.0000 mg | ORAL_TABLET | Freq: Two times a day (BID) | ORAL | 1 refills | Status: DC

## 2017-08-11 MED ORDER — HYDROXYZINE HCL 50 MG PO TABS: 50.0000 mg | ORAL_TABLET | Freq: Three times a day (TID) | ORAL | 1 refills | Status: DC | PRN

## 2017-08-11 MED ORDER — PALIPERIDONE PALMITATE 234 MG/1.5ML IM SUSP: 234.0000 mg | INTRAMUSCULAR | 1 refills | Status: DC

## 2017-08-11 MED ORDER — PALIPERIDONE PALMITATE 156 MG/ML IM SUSP: 156.0000 mg | Freq: Once | INTRAMUSCULAR | Status: DC

## 2017-08-11 MED ORDER — CITALOPRAM HYDROBROMIDE 20 MG PO TABS: 20.0000 mg | ORAL_TABLET | Freq: Every day | ORAL | 1 refills | Status: DC

## 2017-08-11 MED ORDER — GABAPENTIN 300 MG PO CAPS: 300.0000 mg | ORAL_CAPSULE | Freq: Three times a day (TID) | ORAL | 1 refills | Status: DC

## 2017-08-11 MED ORDER — OXCARBAZEPINE 300 MG PO TABS: 300.0000 mg | ORAL_TABLET | Freq: Two times a day (BID) | ORAL | 1 refills | Status: DC

## 2017-08-11 MED ORDER — PALIPERIDONE PALMITATE 156 MG/ML IM SUSP: 156.0000 mg | Freq: Once | INTRAMUSCULAR | 0 refills | Status: DC

## 2017-08-11 NOTE — Tx Team (Signed)
Interdisciplinary Treatment and Diagnostic Plan Update  08/11/2017 Time of Session: [address] Edwin Martinez MRN: [id number]  Principal Diagnosis: Schizoaffective disorder, bipolar type (HCC)  Secondary Diagnoses: Principal Problem:   Schizoaffective disorder, bipolar type (HCC) Active Problems:   HIV disease (HCC)   Cannabis use disorder, moderate, dependence (HCC)   Tobacco use disorder   Homelessness   Cocaine abuse with cocaine-induced mood disorder (HCC)   Current Medications:  Current Facility-Administered Medications  Medication Dose Route Frequency Provider Last Rate Last Dose  . acetaminophen (TYLENOL) tablet 650 mg  650 mg Oral Q6H PRN Pucilowska, Jolanta B, MD      . bictegravir-emtricitabine-tenofovir AF (BIKTARVY) 50-200-25 MG per tablet 1 tablet  1 tablet Oral Daily Pucilowska, Jolanta B, MD   1 tablet at 08/11/17 0844  . busPIRone (BUSPAR) tablet 10 mg  10 mg Oral BID Pucilowska, Jolanta B, MD   10 mg at 08/11/17 0844  . citalopram (CELEXA) tablet 20 mg  20 mg Oral Daily Pucilowska, Jolanta B, MD   20 mg at 08/11/17 0844  . feeding supplement (ENSURE ENLIVE) (ENSURE ENLIVE) liquid 237 mL  237 mL Oral TID BM Pucilowska, Jolanta B, MD   237 mL at 08/11/17 0956  . gabapentin (NEURONTIN) capsule 300 mg  300 mg Oral TID Pucilowska, Jolanta B, MD   300 mg at 08/11/17 1219  . hydrOXYzine (ATARAX/VISTARIL) tablet 50 mg  50 mg Oral TID PRN Pucilowska, Jolanta B, MD   50 mg at 08/10/17 0812  . Oxcarbazepine (TRILEPTAL) tablet 300 mg  300 mg Oral BID Pucilowska, Jolanta B, MD   300 mg at 08/11/17 0844  . [START ON 08/16/2017] paliperidone (INVEGA SUSTENNA) injection 156 mg  156 mg Intramuscular Once Pucilowska, Jolanta B, MD      . paliperidone (INVEGA SUSTENNA) injection 234 mg  234 mg Intramuscular Q28 days Pucilowska, Jolanta B, MD   234 mg at 08/11/17 1222  . traZODone (DESYREL) tablet 100 mg  100 mg Oral QHS Pucilowska, Jolanta B, MD   100 mg at 08/10/17 2149   PTA  Medications: Medications Prior to Admission  Medication Sig Dispense Refill Last Dose  . albuterol (PROVENTIL HFA;VENTOLIN HFA) 108 (90 Base) MCG/ACT inhaler Inhale 2 puffs into the lungs every 6 (six) hours as needed for wheezing or shortness of breath. (Patient not taking: Reported on 07/20/2017) 1 Inhaler 0 Not Taking at Unknown time  . bictegravir-emtricitabine-tenofovir AF (BIKTARVY) 50-200-25 MG TABS tablet Take 1 tablet by mouth daily. 30 tablet 2   . gabapentin (NEURONTIN) 100 MG capsule Take 1 capsule (100 mg total) by mouth 3 (three) times daily. (Patient not taking: Reported on 07/20/2017) 90 capsule 0 Not Taking at Unknown time  . risperiDONE (RISPERDAL) 1 MG tablet Take 1 tablet (1 mg total) by mouth 2 (two) times daily. (Patient not taking: Reported on 07/20/2017) 60 tablet 0 Not Taking at Unknown time  . traZODone (DESYREL) 50 MG tablet Take 1 tablet (50 mg total) by mouth at bedtime as needed for sleep. (Patient not taking: Reported on 07/20/2017) 30 tablet 0 Not Taking at Unknown time    Patient Stressors: Financial difficulties Medication change or noncompliance Substance abuse  Patient Strengths: Ability for insight Active sense of humor Average or above average intelligence Capable of independent living Communication skills  Treatment Modalities: Medication Management, Group therapy, Case management,  1 to 1 session with clinician, Psychoeducation, Recreational therapy.   Physician Treatment Plan for Primary Diagnosis: Schizoaffective disorder, bipolar type (HCC) Long Term Goal(s): Improvement  in symptoms so as ready for discharge Improvement in symptoms so as ready for discharge   Short Term Goals: Ability to identify changes in lifestyle to reduce recurrence of condition will improve Ability to verbalize feelings will improve Ability to disclose and discuss suicidal ideas Ability to demonstrate self-control will improve Ability to identify and develop effective  coping behaviors will improve Ability to maintain clinical measurements within normal limits will improve Compliance with prescribed medications will improve Ability to identify triggers associated with substance abuse/mental health issues will improve Ability to identify changes in lifestyle to reduce recurrence of condition will improve Ability to demonstrate self-control will improve Ability to identify triggers associated with substance abuse/mental health issues will improve  Medication Management: Evaluate patient's response, side effects, and tolerance of medication regimen.  Therapeutic Interventions: 1 to 1 sessions, Unit Group sessions and Medication administration.  Evaluation of Outcomes: Progressing  Physician Treatment Plan for Secondary Diagnosis: Principal Problem:   Schizoaffective disorder, bipolar type (HCC) Active Problems:   HIV disease (HCC)   Cannabis use disorder, moderate, dependence (HCC)   Tobacco use disorder   Homelessness   Cocaine abuse with cocaine-induced mood disorder (HCC)  Long Term Goal(s): Improvement in symptoms so as ready for discharge Improvement in symptoms so as ready for discharge   Short Term Goals: Ability to identify changes in lifestyle to reduce recurrence of condition will improve Ability to verbalize feelings will improve Ability to disclose and discuss suicidal ideas Ability to demonstrate self-control will improve Ability to identify and develop effective coping behaviors will improve Ability to maintain clinical measurements within normal limits will improve Compliance with prescribed medications will improve Ability to identify triggers associated with substance abuse/mental health issues will improve Ability to identify changes in lifestyle to reduce recurrence of condition will improve Ability to demonstrate self-control will improve Ability to identify triggers associated with substance abuse/mental health issues will  improve     Medication Management: Evaluate patient's response, side effects, and tolerance of medication regimen.  Therapeutic Interventions: 1 to 1 sessions, Unit Group sessions and Medication administration.  Evaluation of Outcomes: Progressing   RN Treatment Plan for Primary Diagnosis: Schizoaffective disorder, bipolar type (HCC) Long Term Goal(s): Knowledge of disease and therapeutic regimen to maintain health will improve  Short Term Goals: Ability to identify and develop effective coping behaviors will improve and Compliance with prescribed medications will improve  Medication Management: RN will administer medications as ordered by provider, will assess and evaluate patient's response and provide education to patient for prescribed medication. RN will report any adverse and/or side effects to prescribing provider.  Therapeutic Interventions: 1 on 1 counseling sessions, Psychoeducation, Medication administration, Evaluate responses to treatment, Monitor vital signs and CBGs as ordered, Perform/monitor CIWA, COWS, AIMS and Fall Risk screenings as ordered, Perform wound care treatments as ordered.  Evaluation of Outcomes: Progressing   LCSW Treatment Plan for Primary Diagnosis: Schizoaffective disorder, bipolar type (HCC) Long Term Goal(s): Safe transition to appropriate next level of care at discharge, Engage patient in therapeutic group addressing interpersonal concerns.  Short Term Goals: Engage patient in aftercare planning with referrals and resources, Increase social support and Increase skills for wellness and recovery  Therapeutic Interventions: Assess for all discharge needs, 1 to 1 time with Social worker, Explore available resources and support systems, Assess for adequacy in community support network, Educate family and significant other(s) on suicide prevention, Complete Psychosocial Assessment, Interpersonal group therapy.  Evaluation of Outcomes:  Progressing   Progress in Treatment: Attending  groups: No. Participating in groups: No. Taking medication as prescribed: Yes. Toleration medication: Yes. Family/Significant other contact made: No, will contact:  godfather Patient understands diagnosis: Yes. Discussing patient identified problems/goals with staff: Yes. Medical problems stabilized or resolved: Yes. Denies suicidal/homicidal ideation: Yes. Issues/concerns per patient self-inventory: No. Other: none  New problem(s) identified: No, Describe:  none  New Short Term/Long Term Goal(s):Pt goal: "Stay on medication and listen to my MD."  Discharge Plan or Barriers: [facility]  Reason for Continuation of Hospitalization: Depression Medication stabilization  Estimated Length of Stay: 1-2 days.  Recreational Therapy: Patient Stressors: N/A Patient Goal: The Patient will attend and participate in Recreation Therapy Group Sessions x5 days.  Attendees: Patient: 08/11/2017   Physician: Dr. Jennet MaduroPucilowska, MD 08/11/2017   Nursing: Hulan AmatoGwen Farrish, RN 08/11/2017   RN Care Manager: 08/11/2017   Social Worker: Daleen SquibbGreg Skarleth Delmonico, LCSW 08/11/2017   Recreational Therapist:  08/11/2017   Other:  08/11/2017   Other:  08/11/2017   Other: 08/11/2017           Scribe for Treatment Team: Lorri FrederickWierda, Preslie Depasquale Jon, LCSW 08/11/2017 3:19 PM

## 2017-08-11 NOTE — Progress Notes (Signed)
D- Patient alert and oriented. Patient presents in a pleasant mood stating "I don't know why I'm still up, I'm usually in the bed". Patient states that he prefers to go by [employer]". Patient denies SI/HI/AVH at this time. Patient denies any physical pain at this time. Patient states to this writer that "I'm going back to sleep now".   A- Scheduled medications administered to patient, per MD orders. Support and encouragement provided.  Routine safety checks conducted every 15 minutes.  Patient informed to notify staff with problems or concerns.  R- No adverse drug reactions noted. Patient contracts for safety at this time. Patient compliant with medications and treatment plan. Patient receptive, calm, and cooperative. Patient interacts well with others on the unit.  Patient remains safe at this time.

## 2017-08-11 NOTE — Plan of Care (Signed)
Patient presents in a pleasant mood this morning. Patient has been interacting well with others on the unit and engaging in unit activities. Patient in compliance with therapeutic regimen. Patient displays ability to make decisions and reports that he will notify staff if suicidal ideations arise. Patient's emotional status is improving. Patient denies anxiety at this time and remains safe on the unit.

## 2017-08-11 NOTE — Progress Notes (Signed)
Patient slept through the night without any interruption, no complain and denies SI/HI and AV/H no distress noted

## 2017-08-11 NOTE — [facility] Group Notes (Signed)
  [facility] LCSW Group Therapy Note  Date/Time: 08/11/17, 0930  Type of Therapy/Topic:  Group Therapy:  Emotion Regulation  Participation Level:  Did Not Attend   Mood:  Description of Group:    The purpose of this group is to assist patients in learning to regulate negative emotions and experience positive emotions. Patients will be guided to discuss ways in which they have been vulnerable to their negative emotions. These vulnerabilities will be juxtaposed with experiences of positive emotions or situations, and patients challenged to use positive emotions to combat negative ones. Special emphasis will be placed on coping with negative emotions in conflict situations, and patients will process healthy conflict resolution skills.  Therapeutic Goals: 1. Patient will identify two positive emotions or experiences to reflect on in order to balance out negative emotions:  2. Patient will label two or more emotions that they find the most difficult to experience:  3. Patient will be able to demonstrate positive conflict resolution skills through discussion or role plays:   Summary of Patient Progress:       Therapeutic Modalities:   Cognitive Behavioral Therapy Feelings Identification Dialectical Behavioral Therapy  Greg Rajean Desantiago, LCSW 
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
## 2017-08-11 NOTE — [facility] Group Notes (Signed)
[facility] Group Notes:  (Nursing/MHT/Case Management/Adjunct)  Date:  08/11/2017  Time:  9:27 PM  Type of Therapy:  Group Therapy  Participation Level:  Did Not Attend Summary of Progress/Problems:  Edwin Martinez 08/11/2017, 9:27 PM

## 2017-08-12 ENCOUNTER — Ambulatory Visit: Payer: Self-pay | Admitting: Internal Medicine

## 2017-08-12 DIAGNOSIS — R7989 Other specified abnormal findings of blood chemistry: Secondary | ICD-10-CM | POA: Diagnosis present

## 2017-08-12 DIAGNOSIS — R945 Abnormal results of liver function studies: Secondary | ICD-10-CM

## 2017-08-12 LAB — COMPREHENSIVE METABOLIC PANEL
ALBUMIN: 4.3 g/dL (ref 3.5–5.0)
ALT: 217 U/L — ABNORMAL HIGH (ref 17–63)
ANION GAP: 11 (ref 5–15)
AST: 116 U/L — AB (ref 15–41)
Alkaline Phosphatase: 100 U/L (ref 38–126)
BUN: 18 mg/dL (ref 6–20)
CHLORIDE: 102 mmol/L (ref 101–111)
CO2: 22 mmol/L (ref 22–32)
Calcium: 9.5 mg/dL (ref 8.9–10.3)
Creatinine, Ser: 0.97 mg/dL (ref 0.61–1.24)
GFR calc Af Amer: >60 mL/min (ref >60)
Glucose, Bld: 158 mg/dL — ABNORMAL HIGH (ref 65–99)
POTASSIUM: 4.1 mmol/L (ref 3.5–5.1)
Sodium: 135 mmol/L (ref 135–145)
Total Bilirubin: 0.6 mg/dL (ref 0.3–1.2)
Total Protein: 7.9 g/dL (ref 6.5–8.1)
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
## 2017-08-12 NOTE — [facility] Suicide Risk Assessment (Signed)
[facility]   Principal Problem: Schizoaffective disorder, bipolar type [facility](HCC) Discharge Diagnoses:  Patient Active Problem List   Diagnosis Date Noted  . 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 Herpes zoster [B02.9] 03/06/2017  . Polysubstance abuse (HCC) [F19.10] 03/06/2017  . Homelessness [Z59.0] 03/06/2017  . Schizoaffective disorder, bipolar type (HCC) [F25.0] 12/23/2016  . Suicidal ideation [R45.851]   . Cannabis use disorder, moderate, dependence (HCC) [F12.20] 07/27/2016  . Tobacco use disorder [F17.200] 07/27/2016  . Intentional drug overdose (HCC) [T50.902A] 07/18/2016  . Asthma [J45.909] 05/20/2007  . HIV disease (HCC) [B20] 05/05/2007    Total Time spent with patient: 30 minutes  Musculoskeletal: Strength & Muscle Tone: within normal limits Gait & Station: normal Patient leans: N/A  Psychiatric Specialty Exam: Review of Systems  Neurological: Negative.   Psychiatric/Behavioral: Negative.   All other systems reviewed and are negative.   Blood pressure 122/66, pulse 86, temperature 98.4 F (36.9 C), temperature source Oral, resp. rate 18, height 5\' 7"  (1.702 m), weight 70.8 kg (156 lb), SpO2 99 %.Body mass index is 24.43 kg/m.  General Appearance: Casual  Eye Contact::  Good  Speech:  Clear and Coherent409  Volume:  Normal  Mood:  Euthymic  Affect:  Appropriate  Thought Process:  Goal Directed and Descriptions of Associations: Intact  Orientation:  Full (Time, Place, and Person)  Thought Content:  WDL  Suicidal Thoughts:  No  Homicidal Thoughts:  No  Memory:  Immediate;   Fair Recent;   Fair Remote;   Fair  Judgement:  Poor  Insight:  Shallow  Psychomotor Activity:  Normal  Concentration:  Fair  Recall:  [employer]  Fund of Knowledge:Fair  Language: Fair  Akathisia:  No  Handed:  Right  AIMS (if indicated):     Assets:  Communication Skills Desire for Improvement Resilience  Sleep:   Number of Hours: 6  Cognition: WNL  ADL's:  Intact   Mental Status Per Nursing Assessment::   On Admission:     Demographic Factors:  Male, Gay, lesbian, or bisexual orientation and Unemployed  Loss Factors: Decline in physical health and Financial problems/change in socioeconomic status  Historical Factors: Prior suicide attempts, Family history of suicide, Family history of mental illness or substance abuse and Impulsivity  Risk Reduction Factors:   Sense of responsibility to family and Positive therapeutic relationship  Continued Clinical Symptoms:  Bipolar Disorder:   Depressive phase Alcohol/Substance Abuse/Dependencies  Cognitive Features That Contribute To Risk:  None    Suicide Risk:  Minimal: No identifiable suicidal ideation.  Patients presenting with no risk factors but with morbid ruminations; may be classified as minimal risk based on the severity of the depressive symptoms  Follow-up Information    [name]. Go on 08/16/2017.   Why:  Please attend your follow up appointment on Monday, 08/16/17, at 11:00am.  Please bring a copy of your hospital dishcarge paperwork. Contact information: [address] [place] [address] [phone]           Plan Of Care/Follow-up recommendations:  Activity:  as tolerated Diet:  low sodium heart healthy Other:  keep follow up appointments  Kristine LineaJolanta Tavarious Freel, MD 08/12/2017, 8:04 AM

## 2017-08-12 NOTE — Plan of Care (Signed)
  Coping: Ability to cope will improve 08/12/2017 0509 - Progressing by Trula OreAjetunmobi, Glori Machnik Abisola, RN

## 2017-08-12 NOTE — Progress Notes (Signed)
D: Patient is alert and oriented x 4, he denies SI/HI/AVH., affect is bright on approach., mood is pleasant and cooperative with treatment plan. Pt appears less anxious and he is interacting with peers and staff appropriately. Patient's thoughts are organized , speech is non pressured and non tangential. A: Pt was offered support and encouragement,  given scheduled medications and  was encouraged to attend groups. 15 minute checks were done for safety.  R:Pt did not attend evening wrap up group. Safety maintained on unit, will continue to closely monitor. .Marland Kitchen

## 2017-08-12 NOTE — Progress Notes (Signed)
  [facility] :  Will you be returning to the same living situation after discharge:  Yes,  pt is homeless, but connected to IRC/shelter system At discharge, do you have transportation home?: No. Bus pass provided. Do you have the ability to pay for your medications: No. [facility].  Release of information consent forms completed and in the chart;  Patient's signature needed at discharge.  Patient to Follow up at: Follow-up Information    [name]. Go on 08/16/2017.   Why:  Please attend your follow up appointment on Monday, 08/16/17, at 11:00am.  Please bring a copy of your hospital dishcarge paperwork. Contact information: [address] [place] [address] [phone]           Next level of care provider [facility] Link:no  Safety Planning and Suicide Prevention discussed: No. Attempts made. SPE completed with patient.  Have you used any form of tobacco in the last 30 days? (Cigarettes, Smokeless Tobacco, Cigars, and/or Pipes): No  Has patient been referred to the Quitline?: N/A patient is not a smoker  Patient has been referred for addiction treatment: Yes  Lorri FrederickWierda, Joakim Huesman Jon, LCSW 08/12/2017, 10:25 AM

## 2017-08-15 ENCOUNTER — Emergency Department (HOSPITAL_COMMUNITY): Payer: Self-pay

## 2017-08-15 ENCOUNTER — Inpatient Hospital Stay (HOSPITAL_COMMUNITY)
Admission: EM | Admit: 2017-08-15 | Discharge: 2017-08-17 | DRG: 202 | Disposition: A | Discharge status: Home / Self Care | Payer: No Typology Code available for payment source | Attending: Family Medicine | Admitting: Family Medicine

## 2017-08-15 ENCOUNTER — Encounter (HOSPITAL_COMMUNITY): Payer: Self-pay | Admitting: Pharmacy Technician

## 2017-08-15 DIAGNOSIS — Z818 Family history of other mental and behavioral disorders: Secondary | ICD-10-CM

## 2017-08-15 DIAGNOSIS — Z880 Allergy status to penicillin: Secondary | ICD-10-CM

## 2017-08-15 DIAGNOSIS — R112 Nausea with vomiting, unspecified: Secondary | ICD-10-CM | POA: Diagnosis not present

## 2017-08-15 DIAGNOSIS — R197 Diarrhea, unspecified: Secondary | ICD-10-CM

## 2017-08-15 DIAGNOSIS — Z91010 Allergy to peanuts: Secondary | ICD-10-CM

## 2017-08-15 DIAGNOSIS — B182 Chronic viral hepatitis C: Secondary | ICD-10-CM | POA: Diagnosis present

## 2017-08-15 DIAGNOSIS — Z720 Tobacco use: Secondary | ICD-10-CM

## 2017-08-15 DIAGNOSIS — Z21 Asymptomatic human immunodeficiency virus [HIV] infection status: Secondary | ICD-10-CM

## 2017-08-15 DIAGNOSIS — R740 Nonspecific elevation of levels of transaminase and lactic acid dehydrogenase [LDH]: Secondary | ICD-10-CM | POA: Diagnosis present

## 2017-08-15 DIAGNOSIS — F1410 Cocaine abuse, uncomplicated: Secondary | ICD-10-CM | POA: Diagnosis present

## 2017-08-15 DIAGNOSIS — R1110 Vomiting, unspecified: Secondary | ICD-10-CM

## 2017-08-15 DIAGNOSIS — F1210 Cannabis abuse, uncomplicated: Secondary | ICD-10-CM | POA: Diagnosis present

## 2017-08-15 DIAGNOSIS — R0789 Other chest pain: Secondary | ICD-10-CM

## 2017-08-15 DIAGNOSIS — R0602 Shortness of breath: Secondary | ICD-10-CM | POA: Diagnosis not present

## 2017-08-15 DIAGNOSIS — B20 Human immunodeficiency virus [HIV] disease: Secondary | ICD-10-CM | POA: Diagnosis not present

## 2017-08-15 DIAGNOSIS — B0229 Other postherpetic nervous system involvement: Secondary | ICD-10-CM | POA: Diagnosis present

## 2017-08-15 DIAGNOSIS — R05 Cough: Secondary | ICD-10-CM | POA: Diagnosis not present

## 2017-08-15 DIAGNOSIS — F259 Schizoaffective disorder, unspecified: Secondary | ICD-10-CM | POA: Diagnosis present

## 2017-08-15 DIAGNOSIS — Z8619 Personal history of other infectious and parasitic diseases: Secondary | ICD-10-CM

## 2017-08-15 DIAGNOSIS — Z79899 Other long term (current) drug therapy: Secondary | ICD-10-CM

## 2017-08-15 DIAGNOSIS — R7989 Other specified abnormal findings of blood chemistry: Secondary | ICD-10-CM | POA: Diagnosis present

## 2017-08-15 DIAGNOSIS — J4521 Mild intermittent asthma with (acute) exacerbation: Secondary | ICD-10-CM | POA: Diagnosis not present

## 2017-08-15 DIAGNOSIS — D72829 Elevated white blood cell count, unspecified: Secondary | ICD-10-CM | POA: Diagnosis not present

## 2017-08-15 DIAGNOSIS — Z8249 Family history of ischemic heart disease and other diseases of the circulatory system: Secondary | ICD-10-CM

## 2017-08-15 DIAGNOSIS — Z888 Allergy status to other drugs, medicaments and biological substances status: Secondary | ICD-10-CM

## 2017-08-15 DIAGNOSIS — F909 Attention-deficit hyperactivity disorder, unspecified type: Secondary | ICD-10-CM | POA: Diagnosis present

## 2017-08-15 DIAGNOSIS — Z820 Family history of epilepsy and other diseases of the nervous system: Secondary | ICD-10-CM

## 2017-08-15 DIAGNOSIS — R062 Wheezing: Secondary | ICD-10-CM

## 2017-08-15 DIAGNOSIS — R000 Tachycardia, unspecified: Secondary | ICD-10-CM | POA: Diagnosis present

## 2017-08-15 DIAGNOSIS — Z9114 Patient's other noncompliance with medication regimen: Secondary | ICD-10-CM

## 2017-08-15 DIAGNOSIS — Z590 Homelessness: Secondary | ICD-10-CM

## 2017-08-15 DIAGNOSIS — R059 Cough, unspecified: Secondary | ICD-10-CM

## 2017-08-15 DIAGNOSIS — J45901 Unspecified asthma with (acute) exacerbation: Secondary | ICD-10-CM | POA: Diagnosis not present

## 2017-08-15 DIAGNOSIS — F250 Schizoaffective disorder, bipolar type: Secondary | ICD-10-CM | POA: Diagnosis present

## 2017-08-15 DIAGNOSIS — R7401 Elevation of levels of liver transaminase levels: Secondary | ICD-10-CM

## 2017-08-15 DIAGNOSIS — I10 Essential (primary) hypertension: Secondary | ICD-10-CM | POA: Diagnosis present

## 2017-08-15 DIAGNOSIS — Z881 Allergy status to other antibiotic agents status: Secondary | ICD-10-CM

## 2017-08-15 DIAGNOSIS — F17210 Nicotine dependence, cigarettes, uncomplicated: Secondary | ICD-10-CM | POA: Diagnosis present

## 2017-08-15 DIAGNOSIS — F419 Anxiety disorder, unspecified: Secondary | ICD-10-CM | POA: Diagnosis present

## 2017-08-15 HISTORY — DX: Depression, unspecified: F32.A

## 2017-08-15 HISTORY — DX: Major depressive disorder, single episode, unspecified: F32.9

## 2017-08-15 LAB — COMPREHENSIVE METABOLIC PANEL
ALBUMIN: 4 g/dL (ref 3.5–5.0)
ALK PHOS: 98 U/L (ref 38–126)
ALT: 249 U/L — AB (ref 17–63)
AST: 124 U/L — AB (ref 15–41)
Anion gap: 9 (ref 5–15)
BUN: 12 mg/dL (ref 6–20)
CALCIUM: 9.1 mg/dL (ref 8.9–10.3)
CHLORIDE: 102 mmol/L (ref 101–111)
CO2: 25 mmol/L (ref 22–32)
CREATININE: 0.95 mg/dL (ref 0.61–1.24)
GFR calc non Af Amer: >60 mL/min (ref >60)
GLUCOSE: 88 mg/dL (ref 65–99)
Potassium: 3.7 mmol/L (ref 3.5–5.1)
SODIUM: 136 mmol/L (ref 135–145)
Total Bilirubin: 1.1 mg/dL (ref 0.3–1.2)
Total Protein: 7.5 g/dL (ref 6.5–8.1)

## 2017-08-15 LAB — I-STAT TROPONIN, ED: Troponin i, poc: 0 ng/mL (ref 0.00–0.08)

## 2017-08-15 LAB — CBC WITH DIFFERENTIAL/PLATELET
BASOS ABS: 0 10*3/uL (ref 0.0–0.1)
BASOS PCT: 0 %
Eosinophils Absolute: 0.6 10*3/uL (ref 0.0–0.7)
Eosinophils Relative: 5 %
HEMATOCRIT: 45.5 % (ref 39.0–52.0)
HEMOGLOBIN: 15.3 g/dL (ref 13.0–17.0)
LYMPHS PCT: 27 %
Lymphs Abs: 3.3 10*3/uL (ref 0.7–4.0)
MCH: 28.2 pg (ref 26.0–34.0)
MCHC: 33.6 g/dL (ref 30.0–36.0)
MCV: 83.8 fL (ref 78.0–100.0)
MONO ABS: 1.5 10*3/uL — AB (ref 0.1–1.0)
Monocytes Relative: 12 %
NEUTROS ABS: 6.9 10*3/uL (ref 1.7–7.7)
Neutrophils Relative %: 56 %
Platelets: 219 10*3/uL (ref 150–400)
RBC: 5.43 MIL/uL (ref 4.22–5.81)
RDW: 15 % (ref 11.5–15.5)
WBC: 12.3 10*3/uL — ABNORMAL HIGH (ref 4.0–10.5)

## 2017-08-15 MED ORDER — PALIPERIDONE PALMITATE 234 MG/1.5ML IM SUSP
234.0000 mg | INTRAMUSCULAR | Status: DC
  Filled 2017-08-15: qty 1.5

## 2017-08-15 MED ORDER — BICTEGRAVIR-EMTRICITAB-TENOFOV 50-200-25 MG PO TABS
1.0000 | ORAL_TABLET | Freq: Every day | ORAL | Status: DC
  Administered 2017-08-16 – 2017-08-17 (×2): 1 via ORAL
  Filled 2017-08-15 (×3): qty 1

## 2017-08-15 MED ORDER — IPRATROPIUM-ALBUTEROL 0.5-2.5 (3) MG/3ML IN SOLN
3.0000 mL | Freq: Four times a day (QID) | RESPIRATORY_TRACT | Status: DC
  Administered 2017-08-16 – 2017-08-17 (×6): 3 mL via RESPIRATORY_TRACT
  Filled 2017-08-15 (×9): qty 3

## 2017-08-15 MED ORDER — GABAPENTIN 300 MG PO CAPS
300.0000 mg | ORAL_CAPSULE | Freq: Three times a day (TID) | ORAL | Status: DC
  Administered 2017-08-15 – 2017-08-17 (×6): 300 mg via ORAL
  Filled 2017-08-15 (×6): qty 1

## 2017-08-15 MED ORDER — GUAIFENESIN 100 MG/5ML PO SOLN
5.0000 mL | ORAL | Status: DC | PRN
Start: 2017-08-15 — End: 2017-08-17
  Administered 2017-08-16 (×3): 100 mg via ORAL
  Filled 2017-08-15 (×3): qty 5

## 2017-08-15 MED ORDER — BUSPIRONE HCL 10 MG PO TABS
10.0000 mg | ORAL_TABLET | Freq: Two times a day (BID) | ORAL | Status: DC
  Administered 2017-08-15 – 2017-08-17 (×4): 10 mg via ORAL
  Filled 2017-08-15 (×5): qty 1

## 2017-08-15 MED ORDER — TRAZODONE HCL 100 MG PO TABS
100.0000 mg | ORAL_TABLET | Freq: Every day | ORAL | Status: DC
  Administered 2017-08-15 – 2017-08-16 (×2): 100 mg via ORAL
  Filled 2017-08-15: qty 2
  Filled 2017-08-15: qty 1

## 2017-08-15 MED ORDER — PREDNISONE 50 MG PO TABS
50.0000 mg | ORAL_TABLET | Freq: Every day | ORAL | Status: DC
  Administered 2017-08-16 – 2017-08-17 (×2): 50 mg via ORAL
  Filled 2017-08-15: qty 1
  Filled 2017-08-15: qty 2

## 2017-08-15 MED ORDER — ALBUTEROL SULFATE (2.5 MG/3ML) 0.083% IN NEBU
5.0000 mg | INHALATION_SOLUTION | Freq: Once | RESPIRATORY_TRACT | Status: AC
  Administered 2017-08-15: 5 mg via RESPIRATORY_TRACT
  Filled 2017-08-15: qty 6

## 2017-08-15 MED ORDER — IPRATROPIUM BROMIDE 0.02 % IN SOLN
0.5000 mg | Freq: Once | RESPIRATORY_TRACT | Status: AC
  Administered 2017-08-15: 0.5 mg via RESPIRATORY_TRACT
  Filled 2017-08-15: qty 2.5

## 2017-08-15 MED ORDER — METHYLPREDNISOLONE SODIUM SUCC 125 MG IJ SOLR
125.0000 mg | Freq: Once | INTRAMUSCULAR | Status: AC
  Administered 2017-08-15: 125 mg via INTRAVENOUS
  Filled 2017-08-15: qty 2

## 2017-08-15 MED ORDER — ALBUTEROL (5 MG/ML) CONTINUOUS INHALATION SOLN
10.0000 mg/h | INHALATION_SOLUTION | RESPIRATORY_TRACT | Status: DC
  Administered 2017-08-15: 10 mg/h via RESPIRATORY_TRACT
  Filled 2017-08-15: qty 2
  Filled 2017-08-15: qty 0.5

## 2017-08-15 MED ORDER — IBUPROFEN 600 MG PO TABS: 600.0000 mg | ORAL_TABLET | Freq: Four times a day (QID) | ORAL | Status: DC | PRN

## 2017-08-15 MED ORDER — ENOXAPARIN SODIUM 40 MG/0.4ML ~~LOC~~ SOLN
40.0000 mg | SUBCUTANEOUS | Status: DC
  Administered 2017-08-16 – 2017-08-17 (×2): 40 mg via SUBCUTANEOUS
  Filled 2017-08-15 (×2): qty 0.4

## 2017-08-15 MED ORDER — HYDROXYZINE HCL 25 MG PO TABS: 50.0000 mg | ORAL_TABLET | Freq: Three times a day (TID) | ORAL | Status: DC | PRN

## 2017-08-15 MED ORDER — SODIUM CHLORIDE 0.9 % IV BOLUS (SEPSIS)
1000.0000 mL | Freq: Once | INTRAVENOUS | Status: AC
  Administered 2017-08-15: 1000 mL via INTRAVENOUS

## 2017-08-15 MED ORDER — ALBUTEROL SULFATE HFA 108 (90 BASE) MCG/ACT IN AERS: 2.0000 | INHALATION_SPRAY | RESPIRATORY_TRACT | Status: DC | PRN

## 2017-08-15 MED ORDER — CITALOPRAM HYDROBROMIDE 20 MG PO TABS
20.0000 mg | ORAL_TABLET | Freq: Every day | ORAL | Status: DC
  Administered 2017-08-16 – 2017-08-17 (×2): 20 mg via ORAL
  Filled 2017-08-15 (×2): qty 1

## 2017-08-15 MED ORDER — ONDANSETRON HCL 4 MG/2ML IJ SOLN
4.0000 mg | Freq: Once | INTRAMUSCULAR | Status: AC
  Administered 2017-08-15: 4 mg via INTRAVENOUS
  Filled 2017-08-15: qty 2

## 2017-08-15 NOTE — H&P (Signed)
[facility] Admission History and Physical Service Pager: [phone]  Patient name: Edwin Horsemandward Allen Mersch Medical record number: [id number] Date of birth: Mar 07, 1986 Age: 31 y.o. Gender: male  Primary Care Provider: Judyann MunsonSnider, Cynthia, MD Consultants: None Code Status: Full  Chief Complaint: Shortness of breath  Assessment and Plan: Edwin Martinez is a 31 y.o. male presenting with an asthma exacerbation. PMH is significant for HIV, schizoaffective disorder, polysubstance abuse.  Asthma exacerbation: Mild intermittent asthma. Likely due to URI and also being out of Albuterol inhaler for the last year. CXR without signs of pneumonia.  - Admit to telemetry under observation status, attending Dr. Jennette KettleNeal - s/p Albuterol neb x 1, Atrovent neb x 1, CAT x 1 hour, Solumedrol 125mg  IV x 1 in the ED - Scheduled duonebs q6hrs and albuterol q2hrs prn - Start Prednisone 50mg  starting tomorrow - Robitussin for cough - Pulse ox with vitals - Needs Albuterol on discharge   HIV: Follows with [name]. Last CD4 count 650 on 02/2017. Hasn't taken his HIV meds in the last 2 weeks because he lost his prescription.  - Continue home Bictegravir-Emtricitabine-Tenofovir 50-200-25mg  - Check CD4 count - Needs follow-up with ID on discharge  Transaminitis: AST 124, ALT 249. Initially thought to be due to Depakote, which was stopped at [facility] hospitalization. 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  - Will check Hep C RNA quant - Check PT/INR - If positive, will need to touch base with patient's ID doctor.  Schizoaffective Disorder: Follows at [facility]. Recent hospitalization from 11/7-11/15 for SI. Denies SI/HI currently. - Continue home Buspar 10mg  bid, Celexa 20mg  daily, Hydroxyzine 50mg  tid prn, Trazodone 100mg  qhs - Receives Invega injection every 28 days. Supposed to receive [facility] injection [facility]. Will order this for patient since  he will be here.  Polysubstance Abuse: Uses crack, marijuana, and tobacco. - UDS pending - COWS and CIWA - Thiamine, folate, MVI - Declined nicotine patch  Homelessness: - CSW consult  Recent Hx of Shingles: Occurred on right shoulder in 02/2017. - Continue home Gabapentin 300mg  tid for post-herpetic neuralgia  FEN/GI: Regular diet Prophylaxis: Lovenox  Disposition: Anticipate discharge home 11/19 pending improvement in respiratory status.  History of Present Illness:  Edwin Martinez is a 31 y.o. male presenting with shortness of breath and cough. Two days, he starting have sneezing, coughing, and runny nose. He then started feeling short of breath and this progressively got worse. His cough is worse with laying down. He was coughing up a small amount of green/yellow phlegm, but he has had a dry cough recently. No fevers. He ran out of his Albuterol inhaler for at least a year. His last asthma exacerbation was 2-3 years ago. He has not had any asthma exacerbations in the last year. He has never been intubated. No lower extremity edema.   On the way to the ED, he received Albuterol neb x 1 and Atrovent neb x 1. In the ED, he was tachypneic with RR 20-24. He was also mildly tachycardic. He was given Albuterol neb x 1, Atrovent neb x 1, CAT x 1 hour, Solumedrol 125mg  IV x 1. Labs were significant for WBC 12.3, AST 124, ALT 249. CXR was negative. EKG with sinus tachycardia. I-stat troponin was 0.00. He was admitted for overnight observation.  Review Of Systems: Per HPI with the following additions: see below  Review of Systems  Constitutional: Negative for chills and fever.  HENT:  Negative for hearing loss.   Eyes: Negative for blurred vision and double vision.  Respiratory: Positive for cough. Negative for hemoptysis.   Cardiovascular: Negative for chest pain and palpitations.  Gastrointestinal: Negative for blood in stool, nausea and vomiting.  Genitourinary: Negative for dysuria  and frequency.  Musculoskeletal: Positive for back pain. Negative for myalgias.  Neurological: Positive for headaches. Negative for dizziness.  Psychiatric/Behavioral: Negative for depression and suicidal ideas. The patient is not nervous/anxious.     Patient Active Problem List   Diagnosis Date Noted  . Elevated LFTs 08/12/2017  . Cocaine abuse with cocaine-induced mood disorder (HCC) 03/30/2017  . Herpes zoster 03/06/2017  . Polysubstance abuse (HCC) 03/06/2017  . Homelessness 03/06/2017  . Schizoaffective disorder, bipolar type (HCC) 12/23/2016  . Suicidal ideation   . Cannabis use disorder, moderate, dependence (HCC) 07/27/2016  . Tobacco use disorder 07/27/2016  . Intentional drug overdose (HCC) 07/18/2016  . Asthma 05/20/2007  . HIV disease (HCC) 05/05/2007    Past Medical History: Past Medical History:  Diagnosis Date  . ADHD (attention deficit hyperactivity disorder) 09/12/2012  . Anxiety   . Asthma   . Bipolar 1 disorder (HCC)   . HIV (human immunodeficiency virus infection) (HCC)   . Hypertension   . Schizophrenia (HCC)   . Seizures (HCC)     Past Surgical History: Past Surgical History:  Procedure Laterality Date  . DENTAL SURGERY      Social History: Social History   Tobacco Use  . Smoking status: Current Every Day Smoker    Packs/day: 0.50    Types: Cigarettes    Start date: 09/29/1991  . Smokeless tobacco: Never Used  Substance Use Topics  . Alcohol use: Yes    Alcohol/week: 1.2 oz    Types: 2 Standard drinks or equivalent per week    Comment: little recently  . Drug use: Yes    Types: Cocaine, Marijuana    Comment: smoked marijuana 1-2 days ago   Additional social history: currently homeless and staying in an abandoned house. Smoking occasionally, whenever he can get a cigarette, sometimes up to 1/2 pack per day. Drinks a 40oz beer once a week. Uses crack and marijuana but hasn't used either of these in 2 weeks.   Please also refer to relevant  sections of EMR.  Family History: Family History  Problem Relation Age of Onset  . Huntington's disease Father   . Heart disease Mother   . Suicidality Maternal Uncle   . Suicidality Maternal Grandmother     Allergies and Medications: Allergies  Allergen Reactions  . Magnesium-Containing Compounds Other (See Comments)    This medication is contraindicated with pts HIV meds.    . Peanut-Containing Drug Products Anaphylaxis  . Esomeprazole Magnesium Cough  . Atripla [Efavirenz-Emtricitab-Tenofovir] Other (See Comments)    Reaction:  Suicidal thoughts   . Bactrim [Sulfamethoxazole-Trimethoprim] Rash  . Penicillins Rash and Other (See Comments)    Has patient had a PCN reaction causing immediate rash, facial/tongue/throat swelling, SOB or lightheadedness with hypotension: Yes Has patient had a PCN reaction causing severe rash involving mucus membranes or skin necrosis: No Has patient had a PCN reaction that required hospitalization No Has patient had a PCN reaction occurring within the last 10 years: No If all of the above answers are "NO", then may proceed with Cephalosporin use.   No current facility-administered medications on file prior to encounter.    Current Outpatient Medications on File Prior to Encounter  Medication Sig Dispense Refill  .  busPIRone (BUSPAR) 5 MG tablet Take 2 tablets (10 mg total) 2 (two) times daily by mouth. 60 tablet 1  . citalopram (CELEXA) 20 MG tablet Take 1 tablet (20 mg total) daily by mouth. 30 tablet 1  . gabapentin (NEURONTIN) 300 MG capsule Take 1 capsule (300 mg total) 3 (three) times daily by mouth. 90 capsule 1  . hydrOXYzine (ATARAX/VISTARIL) 50 MG tablet Take 1 tablet (50 mg total) 3 (three) times daily as needed by mouth for anxiety. 90 tablet 1  . [START ON 09/07/2017] paliperidone (INVEGA SUSTENNA) 234 MG/1.5ML SUSP injection Inject 234 mg every 28 (twenty-eight) days into the muscle. 0.9 mL 1  . traZODone (DESYREL) 100 MG tablet Take 1  tablet (100 mg total) at bedtime by mouth. 30 tablet 1  . bictegravir-emtricitabine-tenofovir AF (BIKTARVY) 50-200-25 MG TABS tablet Take 1 tablet by mouth daily. (Patient not taking: Reported on 08/15/2017) 30 tablet 2  . [START ON 08/16/2017] paliperidone (INVEGA SUSTENNA) 156 MG/ML SUSP injection Inject 1 mL (156 mg total) once for 1 dose into the muscle. (Patient not taking: Reported on 08/15/2017) 1 mL 0    Objective: BP 106/70   Pulse (!) 113   Temp 98.3 F (36.8 C) (Oral)   Resp 18   SpO2 97%  Exam: General: Sitting up in bed, sweaty, in NAD Eyes: PERRLA, EOMI, no scleral icterus ENTM: MMM, oropharynx clear Neck: Supple, full ROM, no cervical lymphadenopathy Cardiovascular: Tachycardic, regular rhythm, no murmurs Respiratory: Aeromask in place, able to speak in full sentences when he removes the mask, diffuse expiratory and inspiratory wheezing throughout all lung fields, poor air movement throughout Gastrointestinal: +BS, soft, non-tender, non-distended MSK: Lower extremities warm and well-perfused, no edema Derm: No track marks, multiple hypopigmented patches present on the right shoulder Neuro: Awake, alert, oriented, no focal deficits Psych: Mildly increased rate of speech at times, no SI/HI  Labs and Imaging: CBC BMET  Recent Labs  Lab 08/15/17 1730  WBC 12.3*  HGB 15.3  HCT 45.5  PLT 219   Recent Labs  Lab 08/15/17 1730  NA 136  K 3.7  CL 102  CO2 25  BUN 12  CREATININE 0.95  GLUCOSE 88  CALCIUM 9.1     CXR: No active cardiopulmonary disease EKG: sinus tachycardia  Mayo, Allyn KennerKaty Dodd, MD 08/15/2017, 7:34 PM PGY-3, [facility] FPTS Intern pager: [phone], text pages welcome

## 2017-08-15 NOTE — ED Provider Notes (Signed)
[facility] EMERGENCY DEPARTMENT Provider Note   CSN: [id number] Arrival date & time: 08/15/17  1638     History   Chief Complaint Chief Complaint  Patient presents with  . Shortness of Breath    HPI Edwin Martinez is a 31 y.o. male with a PMHx of asthma, HIV (last CD4 650 on 03/07/17, follows with Dr. [name], recently switched to [facility] on 07/20/17, hasn't taken it in 2 wks), bipolar disorder, HTN, schizophrenia, seizures, ADHD, anxiety, homelessness, and other conditions listed below, who presents to the ED with complaints of cough with green yellow sputum production, gradually worsening shortness of breath, wheezing, chest tightness, sinus congestion with clear rhinorrhea, and chills x 2 days.  Patient states that this feels like prior asthma exacerbations.  He has not tried anything for his symptoms because he doesn't have anything at home to use; en route he was given 10 mg albuterol and 0.5 mg Atrovent which he is currently receiving.  He states that his symptoms worsen when he attempts to sleep.  He also had 3 episodes of posttussive nonbloody emesis earlier today with associated nausea, as well as 2 episodes of nonbloody watery diarrhea earlier.  He states he did have some mild body aches at the beginning of his illness, however those resolved. He admits to being a cigarette smoker.  He also admits that he has been out of his HIV medication for the last 2 weeks (switched from Triumeq to [place] on 07/20/17, 30 day supply prescribed); he is not on any prophylactic abx at this time.  No known sick contacts. He received his flu shot on 07/20/17.   He denies sore throat, ear pain/drainage, hemoptysis, fevers, LE swelling, recent travel/surgery/immobilization, personal/family hx of DVT/PE, abd pain, constipation, melena, hematochezia, hematemesis, hematuria, dysuria, myalgias, arthralgias, numbness, tingling, focal weakness, or any other complaints at this time.     The history is provided by the patient and medical records. No language interpreter was used.  Shortness of Breath  This is a new problem. The average episode lasts 2 days. The problem occurs continuously.The current episode started 2 days ago. The problem has been gradually worsening. Associated symptoms include rhinorrhea, cough, wheezing, chest pain (tightness) and vomiting. Pertinent negatives include no fever, no sore throat, no ear pain, no hemoptysis, no abdominal pain and no leg swelling. It is unknown what precipitated the problem. Risk factors include smoking. He has tried nothing for the symptoms. The treatment provided no relief. Associated medical issues include asthma. Associated medical issues do not include PE, DVT or recent surgery.    Past Medical History:  Diagnosis Date  . ADHD (attention deficit hyperactivity disorder) 09/12/2012  . Anxiety   . Asthma   . Bipolar 1 disorder (HCC)   . HIV (human immunodeficiency virus infection) (HCC)   . Hypertension   . Schizophrenia (HCC)   . Seizures [facility])     Patient Active Problem List   Diagnosis Date Noted  . Elevated LFTs 08/12/2017  . Cocaine abuse with cocaine-induced mood disorder (HCC) 03/30/2017  . Herpes zoster 03/06/2017  . Polysubstance abuse (HCC) 03/06/2017  . Homelessness 03/06/2017  . Schizoaffective disorder, bipolar type (HCC) 12/23/2016  . Suicidal ideation   . Cannabis use disorder, moderate, dependence (HCC) 07/27/2016  . Tobacco use disorder 07/27/2016  . Intentional drug overdose (HCC) 07/18/2016  . Asthma 05/20/2007  . HIV disease (HCC) 05/05/2007    Past Surgical History:  Procedure Laterality Date  . DENTAL SURGERY  Home Medications    Prior to Admission medications   Medication Sig Start Date End Date Taking? Authorizing Provider  bictegravir-emtricitabine-tenofovir AF (BIKTARVY) 50-200-25 MG TABS tablet Take 1 tablet by mouth daily. 07/20/17   Judyann MunsonSnider, Cynthia, MD   busPIRone (BUSPAR) 5 MG tablet Take 2 tablets (10 mg total) 2 (two) times daily by mouth. 08/11/17   Pucilowska, Jolanta B, MD  citalopram (CELEXA) 20 MG tablet Take 1 tablet (20 mg total) daily by mouth. 08/11/17   Pucilowska, Jolanta B, MD  gabapentin (NEURONTIN) 300 MG capsule Take 1 capsule (300 mg total) 3 (three) times daily by mouth. 08/11/17   Pucilowska, Jolanta B, MD  hydrOXYzine (ATARAX/VISTARIL) 50 MG tablet Take 1 tablet (50 mg total) 3 (three) times daily as needed by mouth for anxiety. 08/11/17   Pucilowska, Braulio ConteJolanta B, MD  paliperidone (INVEGA SUSTENNA) 156 MG/ML SUSP injection Inject 1 mL (156 mg total) once for 1 dose into the muscle. 08/16/17 08/16/17  Pucilowska, Ellin GoodieJolanta B, MD  paliperidone (INVEGA SUSTENNA) 234 MG/1.5ML SUSP injection Inject 234 mg every 28 (twenty-eight) days into the muscle. 09/07/17   Pucilowska, Ellin GoodieJolanta B, MD  traZODone (DESYREL) 100 MG tablet Take 1 tablet (100 mg total) at bedtime by mouth. 08/11/17   Pucilowska, Ellin GoodieJolanta B, MD    Family History Family History  Problem Relation Age of Onset  . Huntington's disease Father   . Heart disease Mother   . Suicidality Maternal Uncle   . Suicidality Maternal Grandmother     Social History Social History   Tobacco Use  . Smoking status: Current Every Day Smoker    Packs/day: 0.50    Types: Cigarettes    Start date: 09/29/1991  . Smokeless tobacco: Never Used  Substance Use Topics  . Alcohol use: Yes    Alcohol/week: 1.2 oz    Types: 2 Standard drinks or equivalent per week    Comment: little recently  . Drug use: Yes    Types: Cocaine, Marijuana    Comment: smoked marijuana 1-2 days ago     Allergies   Magnesium-containing compounds; Peanut-containing drug products; Esomeprazole magnesium; Atripla [efavirenz-emtricitab-tenofovir]; Bactrim [sulfamethoxazole-trimethoprim]; and Penicillins   Review of Systems Review of Systems  Constitutional: Positive for chills. Negative for fever.  HENT:  Positive for congestion and rhinorrhea. Negative for ear discharge, ear pain and sore throat.   Respiratory: Positive for cough, chest tightness, shortness of breath and wheezing. Negative for hemoptysis.   Cardiovascular: Positive for chest pain (tightness). Negative for leg swelling.  Gastrointestinal: Positive for diarrhea, nausea and vomiting. Negative for abdominal pain, blood in stool and constipation.  Genitourinary: Negative for dysuria and hematuria.  Musculoskeletal: Negative for arthralgias and myalgias.  Skin: Negative for color change.  Allergic/Immunologic: Positive for immunocompromised state (HIV+).  Neurological: Negative for weakness and numbness.  Psychiatric/Behavioral: Negative for confusion.   All other systems reviewed and are negative for acute change except as noted in the HPI.    Physical Exam Updated Vital Signs BP 128/79 (BP Location: Right Arm)   Pulse 100   Temp 98.3 F (36.8 C) (Oral)   Resp 20   SpO2 100%   Physical Exam  Constitutional: He is oriented to person, place, and time. Vital signs are normal. He appears well-developed and well-nourished.  Non-toxic appearance. No distress.  Afebrile, nontoxic, receiving nebulizer treatment during evaluation, slightly increased WOB but in NAD  HENT:  Head: Normocephalic and atraumatic.  Mouth/Throat: Oropharynx is clear and moist and mucous membranes are normal.  Eyes:  Conjunctivae and EOM are normal. Right eye exhibits no discharge. Left eye exhibits no discharge.  Neck: Normal range of motion. Neck supple.  Cardiovascular: Regular rhythm, normal heart sounds and intact distal pulses. Tachycardia present. Exam reveals no gallop and no friction rub.  No murmur heard. Slightly tachycardic in the low 100s, receiving nebulizer treatment, slightly jittery appearing. Reg rhythm, nl s1/s2, no m/r/g, distal pulses intact, no pedal edema   Pulmonary/Chest: Accessory muscle usage present. No respiratory distress. He  has no decreased breath sounds. He has wheezes. He has rhonchi. He has no rales. He exhibits tenderness. He exhibits no crepitus, no deformity and no retraction.  Slight increased WOB with slight accessory muscle usage, receiving neb tx during evaluation. Diffuse expiratory wheezing in all lung fields with scattered rhonchi throughout. No rales appreciated. Speaking in slightly fragmented sentences, SpO2 100% on 8L via nebulizer mask (did not remove during evaluation to check SpO2) Chest wall with mild anterior/central TTP without crepitus, deformities, or retractions   Abdominal: Soft. Normal appearance and bowel sounds are normal. He exhibits no distension. There is no tenderness. There is no rigidity, no rebound, no guarding, no CVA tenderness, no tenderness at McBurney's point and negative Murphy's sign.  Soft, NTND, +BS throughout, no r/g/r, neg murphy's, neg mcburney's, no CVA TTP   Musculoskeletal: Normal range of motion.  MAE x4 Strength and sensation grossly intact in all extremities Distal pulses intact No pedal edema, neg homan's bilaterally   Neurological: He is alert and oriented to person, place, and time. He has normal strength. No sensory deficit.  Skin: Skin is warm, dry and intact. No rash noted.  Psychiatric: He has a normal mood and affect.  Nursing note and vitals reviewed.    ED Treatments / Results  Labs (all labs ordered are listed, but only abnormal results are displayed) Labs Reviewed  CBC WITH DIFFERENTIAL/PLATELET - Abnormal; Notable for the following components:      Result Value   WBC 12.3 (*)    Monocytes Absolute 1.5 (*)    All other components within normal limits  COMPREHENSIVE METABOLIC PANEL - Abnormal; Notable for the following components:   AST 124 (*)    ALT 249 (*)    All other components within normal limits  I-STAT TROPONIN, ED    Ref. Range 08/07/2017 09:41  Hep A Ab, IgM Latest Ref Range: Negative  Negative  Hepatitis B Surface Ag Latest  Ref Range: Negative  Negative  Hep B Core Ab, IgM Latest Ref Range: Negative  Negative  HCV Ab Latest Ref Range: 0.0 - 0.9 s/co ratio >11.0 (H)    EKG  EKG Interpretation  Date/Time:  Sunday August 15 2017 16:54:03 EST Ventricular Rate:  103 PR Interval:    QRS Duration: 79 QT Interval:  321 QTC Calculation: 421 R Axis:   102 Text Interpretation:  Sinus tachycardia Right axis deviation Baseline wander in lead(s) V3 Sinus tachycardia Confirmed by Bary CastillaMackuen, Courteney ([id number]) on 08/15/2017 5:28:15 PM       Radiology Dg Chest 2 View  Result Date: 08/15/2017 CLINICAL DATA:  Cough and vomiting. EXAM: CHEST  2 VIEW COMPARISON:  March 06, 2017 FINDINGS: The heart size and mediastinal contours are within normal limits. Both lungs are clear. The visualized skeletal structures are unremarkable. IMPRESSION: No active cardiopulmonary disease. Electronically Signed   By: Gerome Samavid  Williams III M.D   On: 08/15/2017 17:50    Procedures Procedures (including critical care time)  CRITICAL CARE- multiple nebulizer treatments Performed by:  Rachelann Enloe   Total critical care time: 35 minutes  Critical care time was exclusive of separately billable procedures and treating other patients.  Critical care was necessary to treat or prevent imminent or life-threatening deterioration.  Critical care was time spent personally by me on the following activities: development of treatment plan with patient and/or surrogate as well as nursing, discussions with consultants, evaluation of patient's response to treatment, examination of patient, obtaining history from patient or surrogate, ordering and performing treatments and interventions, ordering and review of laboratory studies, ordering and review of radiographic studies, pulse oximetry and re-evaluation of patient's condition.   Medications Ordered in ED Medications  albuterol (PROVENTIL,VENTOLIN) solution continuous neb (not administered)   methylPREDNISolone sodium succinate (SOLU-MEDROL) 125 mg/2 mL injection 125 mg (125 mg Intravenous Given 08/15/17 1829)  albuterol (PROVENTIL) (2.5 MG/3ML) 0.083% nebulizer solution 5 mg (5 mg Nebulization Given 08/15/17 1827)  ipratropium (ATROVENT) nebulizer solution 0.5 mg (0.5 mg Nebulization Given 08/15/17 1827)  ondansetron (ZOFRAN) injection 4 mg (4 mg Intravenous Given 08/15/17 1830)  sodium chloride 0.9 % bolus 1,000 mL (1,000 mLs Intravenous New Bag/Given 08/15/17 1833)     Initial Impression / Assessment and Plan / ED Course  I have reviewed the triage vital signs and the nursing notes.  Pertinent labs & imaging results that were available during my care of the patient were reviewed by me and considered in my medical decision making (see chart for details).     31 y.o. male here with cough/SOB/wheezing/chest tightness x2 days, some chills, had post tussive emesis and some mild diarrhea earlier as well. Admits he hasn't been on his Biktarvy antiretroviral in 2wks, and admits to being a tobacco smoker, smoking cessation strongly encouraged and discussed importance of compliance with his medications.  On exam, no abdominal tenderness, diffuse expiratory wheezing and scattered rhonchi throughout, mildly tachycardic but currently getting nebulizer treatment, afebrile, no pedal edema, slight increased WOB. Likely asthma exacerbation, however due to HIV status, PCP PNA is a concern. Has received 10mg  albuterol and 0.5mg  atrovent, will give another duoneb and solumedrol, if unimproved will need continuous albuterol neb; unfortunately pt can't receive magnesium due to allergy/contraindication. EKG with sinus tachycardia but otherwise nonischemic. Will get labs and CXR, and give fluids/zofran/nebs/solumedrol, reassess shortly. May need admission if we are unable to improve his respiratory status. Discussed case with my attending Dr. Corlis Leak who agrees with plan.   7:19 PM CBC w/diff with mild  leukocytosis 12.3 with monocytic predominance. CMP with stable transaminitis (during last psych stay on 08/07/17 Hepatitis panel done, HepA/B negative but HCV Ab >11.0 but doesn't look like HCV Nucleic Acid Amplification  Test was done). Trop neg. CXR negative for acute findings, no PNA. Pt feeling slightly better, very jittery from the albuterol nebs, however lung sounds not tremendously improved with this second duoneb, still with diffuse wheezing throughout (now has received total of 15mg  albuterol and 1mg  atrovent between nebs here and given by EMS); will start CAT but doubt this will significantly improve him enough to go home; will proceed with admission for asthma exacerbation.  7:32 PM Dr. Nancy Marus of [facility] residency returning page and will admit. Holding orders to be placed by admitting team. Please see their notes for further documentation of care. I appreciate their help with this pleasant pt's care. Pt stable at time of admission.    Final Clinical Impressions(s) / ED Diagnoses   Final diagnoses:  Exacerbation of asthma, unspecified asthma severity, unspecified whether persistent  SOB (shortness  of breath)  Cough  Wheezing  Nausea vomiting and diarrhea  Chest tightness  Post-tussive emesis  Tobacco user  Transaminitis  Leukocytosis, unspecified type    ED Discharge Orders    [address] [place], [place] 08/15/17 1932    Abelino Derrick, MD 08/15/17 2237    Abelino Derrick, MD 08/15/17 2239

## 2017-08-15 NOTE — ED Triage Notes (Signed)
Pt presents to the ed via [facility] with reports of Sob X2 days, expiratory wheezing in all lung fields, speaking in full sentences. VSS with ems. Given 10mg  albuterol 0.5mg  atrovent en route. Pt does not have a rescue inhaler.

## 2017-08-15 NOTE — ED Notes (Signed)
Pt given turkey sandwich and sprite.  

## 2017-08-15 NOTE — ED Notes (Signed)
RT aware of continuous neb tx order

## 2017-08-16 ENCOUNTER — Other Ambulatory Visit: Payer: Self-pay

## 2017-08-16 ENCOUNTER — Encounter (HOSPITAL_COMMUNITY): Payer: Self-pay | Admitting: General Practice

## 2017-08-16 DIAGNOSIS — B20 Human immunodeficiency virus [HIV] disease: Secondary | ICD-10-CM | POA: Diagnosis not present

## 2017-08-16 DIAGNOSIS — R740 Nonspecific elevation of levels of transaminase and lactic acid dehydrogenase [LDH]: Secondary | ICD-10-CM | POA: Diagnosis not present

## 2017-08-16 DIAGNOSIS — D72829 Elevated white blood cell count, unspecified: Secondary | ICD-10-CM | POA: Insufficient documentation

## 2017-08-16 DIAGNOSIS — J4521 Mild intermittent asthma with (acute) exacerbation: Secondary | ICD-10-CM | POA: Diagnosis not present

## 2017-08-16 DIAGNOSIS — R768 Other specified abnormal immunological findings in serum: Secondary | ICD-10-CM

## 2017-08-16 DIAGNOSIS — R059 Cough, unspecified: Secondary | ICD-10-CM | POA: Insufficient documentation

## 2017-08-16 DIAGNOSIS — R0789 Other chest pain: Secondary | ICD-10-CM | POA: Insufficient documentation

## 2017-08-16 DIAGNOSIS — R05 Cough: Secondary | ICD-10-CM | POA: Insufficient documentation

## 2017-08-16 DIAGNOSIS — R0602 Shortness of breath: Secondary | ICD-10-CM | POA: Insufficient documentation

## 2017-08-16 DIAGNOSIS — R197 Diarrhea, unspecified: Secondary | ICD-10-CM

## 2017-08-16 DIAGNOSIS — R112 Nausea with vomiting, unspecified: Secondary | ICD-10-CM

## 2017-08-16 LAB — COMPREHENSIVE METABOLIC PANEL
ALT: 222 U/L — ABNORMAL HIGH (ref 17–63)
ANION GAP: 8 (ref 5–15)
AST: 95 U/L — ABNORMAL HIGH (ref 15–41)
Albumin: 3.7 g/dL (ref 3.5–5.0)
Alkaline Phosphatase: 88 U/L (ref 38–126)
BUN: 13 mg/dL (ref 6–20)
CALCIUM: 9.1 mg/dL (ref 8.9–10.3)
CHLORIDE: 105 mmol/L (ref 101–111)
CO2: 24 mmol/L (ref 22–32)
Creatinine, Ser: 0.98 mg/dL (ref 0.61–1.24)
GFR calc non Af Amer: >60 mL/min (ref >60)
Glucose, Bld: 155 mg/dL — ABNORMAL HIGH (ref 65–99)
Potassium: 4.7 mmol/L (ref 3.5–5.1)
SODIUM: 137 mmol/L (ref 135–145)
Total Bilirubin: 0.7 mg/dL (ref 0.3–1.2)
Total Protein: 7.3 g/dL (ref 6.5–8.1)

## 2017-08-16 LAB — CBC
HCT: 41.9 % (ref 39.0–52.0)
HCT: 42.8 % (ref 39.0–52.0)
Hemoglobin: 13.9 g/dL (ref 13.0–17.0)
Hemoglobin: 14.4 g/dL (ref 13.0–17.0)
MCH: 27.5 pg (ref 26.0–34.0)
MCH: 28.3 pg (ref 26.0–34.0)
MCHC: 33.2 g/dL (ref 30.0–36.0)
MCHC: 33.6 g/dL (ref 30.0–36.0)
MCV: 82.8 fL (ref 78.0–100.0)
MCV: 84.1 fL (ref 78.0–100.0)
PLATELETS: 228 10*3/uL (ref 150–400)
Platelets: 218 10*3/uL (ref 150–400)
RBC: 5.06 MIL/uL (ref 4.22–5.81)
RBC: 5.09 MIL/uL (ref 4.22–5.81)
RDW: 14.7 % (ref 11.5–15.5)
RDW: 14.9 % (ref 11.5–15.5)
WBC: 10.5 10*3/uL (ref 4.0–10.5)
WBC: 12.1 10*3/uL — ABNORMAL HIGH (ref 4.0–10.5)

## 2017-08-16 LAB — APTT: APTT: 31 s (ref 24–36)

## 2017-08-16 LAB — PROTIME-INR
INR: 1.06
Prothrombin Time: 13.7 seconds (ref 11.4–15.2)

## 2017-08-16 LAB — RAPID URINE DRUG SCREEN, HOSP PERFORMED
Amphetamines: NOT DETECTED
Barbiturates: NOT DETECTED
Benzodiazepines: NOT DETECTED
COCAINE: POSITIVE — AB
OPIATES: POSITIVE — AB
TETRAHYDROCANNABINOL: NOT DETECTED

## 2017-08-16 LAB — CREATININE, SERUM
CREATININE: 1.27 mg/dL — AB (ref 0.61–1.24)
GFR calc Af Amer: >60 mL/min (ref >60)

## 2017-08-16 MED ORDER — BUDESONIDE 0.25 MG/2ML IN SUSP
0.2500 mg | Freq: Two times a day (BID) | RESPIRATORY_TRACT | Status: DC
  Administered 2017-08-16 – 2017-08-17 (×3): 0.25 mg via RESPIRATORY_TRACT
  Filled 2017-08-16 (×5): qty 2

## 2017-08-16 MED ORDER — FOLIC ACID 1 MG PO TABS
1.0000 mg | ORAL_TABLET | Freq: Every day | ORAL | Status: DC
  Administered 2017-08-16 – 2017-08-17 (×2): 1 mg via ORAL
  Filled 2017-08-16 (×2): qty 1

## 2017-08-16 MED ORDER — ALBUTEROL SULFATE (2.5 MG/3ML) 0.083% IN NEBU: 3.0000 mL | INHALATION_SOLUTION | RESPIRATORY_TRACT | Status: DC | PRN

## 2017-08-16 MED ORDER — WHITE PETROLATUM EX OINT
TOPICAL_OINTMENT | CUTANEOUS | Status: AC
  Filled 2017-08-16: qty 28.35

## 2017-08-16 MED ORDER — FLUTICASONE PROPIONATE HFA 44 MCG/ACT IN AERO: 2.0000 | INHALATION_SPRAY | Freq: Two times a day (BID) | RESPIRATORY_TRACT | Status: DC

## 2017-08-16 MED ORDER — PALIPERIDONE PALMITATE 156 MG/ML IM SUSP
156.0000 mg | Freq: Once | INTRAMUSCULAR | Status: AC
  Administered 2017-08-17: 156 mg via INTRAMUSCULAR
  Filled 2017-08-16 (×2): qty 1

## 2017-08-16 MED ORDER — VITAMIN B-1 100 MG PO TABS
50.0000 mg | ORAL_TABLET | Freq: Every day | ORAL | Status: DC
  Administered 2017-08-16 – 2017-08-17 (×2): 50 mg via ORAL
  Filled 2017-08-16 (×2): qty 1

## 2017-08-16 MED ORDER — PROSIGHT PO TABS
1.0000 | ORAL_TABLET | Freq: Every day | ORAL | Status: DC
  Administered 2017-08-16 – 2017-08-17 (×2): 1 via ORAL
  Filled 2017-08-16 (×2): qty 1

## 2017-08-16 NOTE — ED Notes (Signed)
Admitting MD at the bedside.  
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
## 2017-08-16 NOTE — ED Notes (Signed)
Regular Diet was ordered for Lunch. 

## 2017-08-16 NOTE — ED Notes (Signed)
MD at the bedside  

## 2017-08-16 NOTE — ED Notes (Signed)
Attempted Report x1.   

## 2017-08-16 NOTE — Progress Notes (Signed)
1406 Received patient from ED. Patient alert and oriented. Patient denies pain at this time. Resting comfortably in bed.

## 2017-08-16 NOTE — ED Notes (Signed)
Pt placed on 2lpm O2 by N/C
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
## 2017-08-17 DIAGNOSIS — B20 Human immunodeficiency virus [HIV] disease: Secondary | ICD-10-CM | POA: Diagnosis not present

## 2017-08-17 DIAGNOSIS — F17210 Nicotine dependence, cigarettes, uncomplicated: Secondary | ICD-10-CM | POA: Diagnosis not present

## 2017-08-17 DIAGNOSIS — F250 Schizoaffective disorder, bipolar type: Secondary | ICD-10-CM

## 2017-08-17 DIAGNOSIS — Z590 Homelessness: Secondary | ICD-10-CM | POA: Diagnosis not present

## 2017-08-17 DIAGNOSIS — F209 Schizophrenia, unspecified: Secondary | ICD-10-CM | POA: Diagnosis not present

## 2017-08-17 DIAGNOSIS — J45901 Unspecified asthma with (acute) exacerbation: Secondary | ICD-10-CM | POA: Diagnosis not present

## 2017-08-17 DIAGNOSIS — F1210 Cannabis abuse, uncomplicated: Secondary | ICD-10-CM

## 2017-08-17 DIAGNOSIS — F1910 Other psychoactive substance abuse, uncomplicated: Secondary | ICD-10-CM | POA: Diagnosis not present

## 2017-08-17 DIAGNOSIS — F1410 Cocaine abuse, uncomplicated: Secondary | ICD-10-CM | POA: Diagnosis not present

## 2017-08-17 LAB — HCV RNA QUANT
HCV QUANT: 425000 [IU]/mL (ref >50)
HCV Quantitative Log: 5.628 log10 IU/mL (ref >1.70)

## 2017-08-17 LAB — T-HELPER CELLS (CD4) COUNT (NOT AT ARMC)
CD4 % Helper T Cell: 28 % — ABNORMAL LOW (ref 33–55)
CD4 T Cell Abs: 340 /uL — ABNORMAL LOW (ref 400–2700)

## 2017-08-17 MED ORDER — ALBUTEROL SULFATE HFA 108 (90 BASE) MCG/ACT IN AERS: 2.0000 | INHALATION_SPRAY | Freq: Four times a day (QID) | RESPIRATORY_TRACT | 2 refills | Status: DC | PRN

## 2017-08-17 MED ORDER — CITALOPRAM HYDROBROMIDE 20 MG PO TABS: 20.0000 mg | ORAL_TABLET | Freq: Every day | ORAL | 1 refills | Status: DC

## 2017-08-17 MED ORDER — HYDROXYZINE HCL 50 MG PO TABS: 50.0000 mg | ORAL_TABLET | Freq: Three times a day (TID) | ORAL | 0 refills | Status: DC | PRN

## 2017-08-17 MED ORDER — PREDNISONE 50 MG PO TABS: ORAL_TABLET | ORAL | 0 refills | Status: DC

## 2017-08-17 MED ORDER — TRAZODONE HCL 100 MG PO TABS: 100.0000 mg | ORAL_TABLET | Freq: Every day | ORAL | 1 refills | Status: DC

## 2017-08-17 MED ORDER — BUSPIRONE HCL 5 MG PO TABS: 10.0000 mg | ORAL_TABLET | Freq: Two times a day (BID) | ORAL | 1 refills | Status: DC

## 2017-08-17 MED ORDER — BICTEGRAVIR-EMTRICITAB-TENOFOV 50-200-25 MG PO TABS: 1.0000 | ORAL_TABLET | Freq: Every day | ORAL | 2 refills | Status: DC

## 2017-08-17 MED ORDER — GABAPENTIN 300 MG PO CAPS: 300.0000 mg | ORAL_CAPSULE | Freq: Three times a day (TID) | ORAL | 0 refills | Status: DC

## 2017-08-17 MED ORDER — FLUTICASONE PROPIONATE HFA 44 MCG/ACT IN AERO: 2.0000 | INHALATION_SPRAY | Freq: Two times a day (BID) | RESPIRATORY_TRACT | 2 refills | Status: DC

## 2017-08-17 MED ORDER — BICTEGRAVIR-EMTRICITAB-TENOFOV 50-200-25 MG PO TABS: 1.0000 | ORAL_TABLET | Freq: Every day | ORAL | 0 refills | Status: DC

## 2017-08-17 NOTE — Social Work (Signed)
CSW provided transportation assistance to RN for patient. RN will advise if there are any other needs.  Keene BreathPatricia Kaydi Kley, LCSW Clinical Social Worker [phone]
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
## 2017-08-17 NOTE — Progress Notes (Signed)
Patient was given discharge instructions, prescriptions, and transportation assistance. Patient verbalized understanding of instructions. Patient left unit in stable condition.

## 2017-08-17 NOTE — Progress Notes (Signed)
Tech offered Pt a bath. Pt stated that he will take a shower tonight. Tech suggest to Pt that it is still early in the day and there is plenty of time to get a bath during the day. Pt continues to states he will do bath tonight.

## 2017-08-17 NOTE — Progress Notes (Signed)
[facility]    Date of Admission:  08/15/2017      ID: Edwin Martinez is a 31 y.o. male with HIV disease, poorly controlled admitted for asthma exacerbation Active Problems:   HIV (human immunodeficiency virus infection) (HCC)   Tobacco user   Asthma exacerbation   Nausea vomiting and diarrhea    Subjective: Feeling still wheezy, mildly improved. Getting albuterol nebs q 4-6hr.  Medications:  . bictegravir-emtricitabine-tenofovir AF  1 tablet Oral Daily  . budesonide (PULMICORT) nebulizer solution  0.25 mg Nebulization BID  . busPIRone  10 mg Oral BID  . citalopram  20 mg Oral Daily  . enoxaparin (LOVENOX) injection  40 mg Subcutaneous Q24H  . folic acid  1 mg Oral Daily  . gabapentin  300 mg Oral TID  . ipratropium-albuterol  3 mL Nebulization Q6H  . multivitamin  1 tablet Oral Daily  . [START ON 09/09/2017] paliperidone  234 mg Intramuscular Q28 days  . predniSONE  50 mg Oral Q breakfast  . thiamine  50 mg Oral Daily  . traZODone  100 mg Oral QHS    Objective: Vital signs in last 24 hours: Temp:  [97.6 F (36.4 C)-98.4 F (36.9 C)] 97.6 F (36.4 C) (11/20 0634) Pulse Rate:  [74-88] 81 (11/20 0634) Resp:  [17-24] 17 (11/20 0634) BP: (111-130)/(56-78) 111/56 (11/20 0634) SpO2:  [93 %-100 %] 93 % (11/20 0634) Weight:  [158 lb 8.2 oz (71.9 kg)] 158 lb 8.2 oz (71.9 kg) (11/20 1011) Physical Exam  Constitutional: He is oriented to person, place, and time. He appears well-developed and well-nourished. No distress.  HENT:  Mouth/Throat: Oropharynx is clear and moist. No oropharyngeal exudate.  Cardiovascular: Normal rate, regular rhythm and normal heart sounds. Exam reveals no gallop and no friction rub.  No murmur heard.  Pulmonary/Chest: Effort normal and breath sounds normal. Long course exp wheeze heard throughout Abdominal: Soft. Bowel sounds are normal. He exhibits no distension. There is no tenderness.  Lymphadenopathy:  He  has no cervical adenopathy.  Neurological: He is alert and oriented to person, place, and time.  Skin: Skin is warm and dry. No rash noted. No erythema.  Psychiatric: He has a normal mood and affect. His behavior is normal.     Lab Results Recent Labs    08/15/17 1730 08/15/17 2340 08/16/17 0553  WBC 12.3* 12.1* 10.5  HGB 15.3 14.4 13.9  HCT 45.5 42.8 41.9  NA 136  --  137  K 3.7  --  4.7  CL 102  --  105  CO2 25  --  24  BUN 12  --  13  CREATININE 0.95 1.27* 0.98   Liver Panel Recent Labs    08/15/17 1730 08/16/17 0553  PROT 7.5 7.3  ALBUMIN 4.0 3.7  AST 124* 95*  ALT 249* 222*  ALKPHOS 98 88  BILITOT 1.1 0.7    Studies/Results: Dg Chest 2 View  Result Date: 08/15/2017 CLINICAL DATA:  Cough and vomiting. EXAM: CHEST  2 VIEW COMPARISON:  March 06, 2017 FINDINGS: The heart size and mediastinal contours are within normal limits. Both lungs are clear. The visualized skeletal structures are unremarkable. IMPRESSION: No active cardiopulmonary disease. Electronically Signed   By: Gerome Samavid  Williams III M.D   On: 08/15/2017 17:50     Assessment/Plan: Asthma exacerbation = still continue on albuterol nebs plus oral prednisonre  hiv disease = continue with daily biktarvy. Will get bus passes or have someone pick up  meds for him. Currently at [facility] has a month supply for him  Schizophrenia = getting depot today  Homelessness =arranging to getting a pair of shoes, wool socks +/- if can get sleeping bag for him  Drue SecondSNIDER, [facility] Cell: [phone] Pager: [phone]  08/17/2017, 1:09 PM
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
## 2017-08-17 NOTE — Care Management Note (Signed)
Case Management Note  Patient Details  Name: [name] MRN: [id number] Date of Birth: Dec 07, 1985  Subjective/Objective:                    Action/Plan:  See Dr Feliz BeamSnider's note regarding 042 meds.   [facility]. Patient voiced understanding. Provided [facility] information.   Have asked social work Elease Hashimotoatricia to see for shelter list and bus passes.  Expected Discharge Date:                  Expected Discharge Plan:  Homeless Shelter  In-House Referral:  Clinical Social Work, Museum/gallery exhibitions officerinancial Counselor  Discharge planning Services  CM Consult, Indigent Health Clinic, MATCH Program, Medication Assistance  Post Acute Care Choice:    Choice offered to:  Patient  DME Arranged:    DME Agency:     HH Arranged:    HH Agency:     Status of Service:  Completed, signed off  If discussed at [employer] Meetings, dates discussed:    Additional Comments:  Kingsley PlanWile, Euline Kimbler Marie, RN 08/17/2017, 2:40 PM

## 2017-08-17 NOTE — Progress Notes (Signed)
Family Medicine Teaching Service Daily Progress Note Intern Pager: [phone]  Patient name: Edwin Martinez Medical record number: [id number] Date of birth: [birth year] Age: 31 y.o. Gender: male  Primary Care Provider: Judyann MunsonSnider, Cynthia, MD Consultants: None Code Status: Full  Pt Overview and Major Events to Date:  CXR and EKG show no active cardiopulmonary disease, consolidation, edema, or pleural effusion.  Assessment and Plan: Edwin Martinez is a 31 y.o. male presenting with an asthma exacerbation. PMH is significant for HIV, schizoaffective disorder, polysubstance abuse.  Asthma exacerbation: Mild intermittent asthma. Likely due to URI and also being out of Albuterol inhaler for the last year. CXR without signs of pneumonia. - Continue scheduled duonebs q6hrs and albuterol q2hrs prn - Continue Prednisone 50mg , day 2 of 5 - On Budesonide nebs BID while in hospital, switch to Flovent on d/c - Robitussin for cough - Continuous pulse ox with vitals - Needs Albuterol on discharge  HIV: Follows with [name]. Last CD4 count 650 on 02/2017. Hasn't taken his HIV meds in the last 2 weeks because he lost his prescription.  - Continue home Bictegravir-Emtricitabine-Tenofovir 50-200-25mg  - CD4 count pending - ID recs made and are appreciated by primary team  Transaminitis: AST 124, ALT 249. Initially thought to be due to Depakote, which [facility] 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  - Awaiting results of Hep C RNA quant - ID also recommended checking for Hep A, which was negative on 11/10  [facility]ive [facility]: [facility] Recent hospitalization from 11/7-11/15 for SI. Denies SI/HI currently. - Continue home Buspar 10mg  bid, Celexa 20mg  daily, Hydroxyzine 50mg  tid prn, Trazodone 100mg  qhs - Received Invega injection loading dose of 156mg  on 11/19 and will receive another injection every 28  days.  Polysubstance Abuse: Uses crack, marijuana, and tobacco. - UDS positive for opiate use and cocaine - Continue COWS and CIWA - Thiamine, folate, MVI - Declined nicotine patch  Homelessness: Patient has history of medication non-compliance  - CSW consult placed  Recent Hx of Shingles: Occurred on right shoulder in 02/2017. - Continue home Gabapentin 300mg  tid for post-herpetic neuralgia  FEN/GI: Regular diet PPx: Lovenox  Disposition: Possible discharge today/tomorrow given improvement in respiratory status  Subjective:  Patient states today he feels good. He is still complaining of cough and is coughing frequently that is non-productive. He has no other complaints and is doing well.  Objective: Temp:  [97.6 F (36.4 C)-98.4 F (36.9 C)] 97.6 F (36.4 C) (11/20 0634) Pulse Rate:  [71-98] 81 (11/20 0634) Resp:  [15-25] 17 (11/20 0634) BP: (111-130)/(52-78) 111/56 (11/20 0634) SpO2:  [93 %-100 %] 93 % (11/20 [id number]) Physical Exam: Gen: Alert and Oriented x 3, NAD CV: RRR, no murmurs, normal S1, S2 split, +2 pulses dorsalis pedis bilaterally Resp: Coarse lung sound in all lung fields with mild end-expiratory wheezes in all lung fields. Abd: non-distended, mildly diffuse tenderness in all quadrants, soft, +bs in all four quadrants, no hepatosplenomegaly Ext: no clubbing, cyanosis, or edema Skin: warm, dry, intact, no rashes  Laboratory: Recent Labs  Lab 08/15/17 1730 08/15/17 2340 08/16/17 0553  WBC 12.3* 12.1* 10.5  HGB 15.3 14.4 13.9  HCT 45.5 42.8 41.9  PLT 219 218 228   Recent Labs  Lab 08/12/17 0819 08/15/17 1730 08/15/17 2340 08/16/17 0553  NA 135 136  --  137  K 4.1 3.7  --  4.7  CL 102 102  --  105  CO2 22 25  --  24  BUN 18 12  --  13  CREATININE 0.97 0.95 1.27* 0.98  CALCIUM 9.5 9.1  --  9.1  PROT 7.9 7.5  --  7.3  BILITOT 0.6 1.1  --  0.7  ALKPHOS 100 98  --  88  ALT 217* 249*  --  222*  AST 116* 124*  --  95*  GLUCOSE 158* 88  --  155*    11/18 - Troponin I: 0.00 11/18 - HCV Ab: >11.0 11/19 - CD4 count: pending 11/19 - PT/INR: 13.7/1.06 11/19 - HCV RNA quant: pending  Imaging/Diagnostic Tests:  11/18 - CXR: No active cardiopulmonary disease 11/18 - EKG: sinus tachycardia  Arlyce HarmanLockamy, Foy Mungia, DO 08/17/2017, 7:07 AM PGY-1, [place] [facility] FPTS Intern pager: [phone], text pages welcome

## 2017-08-24 ENCOUNTER — Encounter (HOSPITAL_COMMUNITY): Payer: Self-pay | Admitting: Emergency Medicine

## 2017-08-24 ENCOUNTER — Other Ambulatory Visit: Payer: Self-pay

## 2017-08-24 ENCOUNTER — Emergency Department (HOSPITAL_COMMUNITY): Payer: Self-pay

## 2017-08-24 ENCOUNTER — Emergency Department (HOSPITAL_COMMUNITY)
Admission: EM | Admit: 2017-08-24 | Discharge: 2017-08-24 | Disposition: A | Discharge status: Home / Self Care | Payer: Self-pay | Attending: Emergency Medicine | Admitting: Emergency Medicine

## 2017-08-24 DIAGNOSIS — I10 Essential (primary) hypertension: Secondary | ICD-10-CM | POA: Insufficient documentation

## 2017-08-24 DIAGNOSIS — F1414 Cocaine abuse with cocaine-induced mood disorder: Secondary | ICD-10-CM | POA: Diagnosis present

## 2017-08-24 DIAGNOSIS — F1910 Other psychoactive substance abuse, uncomplicated: Secondary | ICD-10-CM

## 2017-08-24 DIAGNOSIS — Z79899 Other long term (current) drug therapy: Secondary | ICD-10-CM | POA: Insufficient documentation

## 2017-08-24 DIAGNOSIS — F17210 Nicotine dependence, cigarettes, uncomplicated: Secondary | ICD-10-CM | POA: Insufficient documentation

## 2017-08-24 DIAGNOSIS — F10929 Alcohol use, unspecified with intoxication, unspecified: Secondary | ICD-10-CM | POA: Insufficient documentation

## 2017-08-24 DIAGNOSIS — J4521 Mild intermittent asthma with (acute) exacerbation: Secondary | ICD-10-CM | POA: Insufficient documentation

## 2017-08-24 DIAGNOSIS — F10920 Alcohol use, unspecified with intoxication, uncomplicated: Secondary | ICD-10-CM

## 2017-08-24 DIAGNOSIS — F909 Attention-deficit hyperactivity disorder, unspecified type: Secondary | ICD-10-CM | POA: Insufficient documentation

## 2017-08-24 LAB — COMPREHENSIVE METABOLIC PANEL
ALBUMIN: 4.1 g/dL (ref 3.5–5.0)
ALT: 110 U/L — ABNORMAL HIGH (ref 17–63)
ANION GAP: 10 (ref 5–15)
AST: 72 U/L — ABNORMAL HIGH (ref 15–41)
Alkaline Phosphatase: 97 U/L (ref 38–126)
BUN: 12 mg/dL (ref 6–20)
CO2: 22 mmol/L (ref 22–32)
Calcium: 9 mg/dL (ref 8.9–10.3)
Chloride: 112 mmol/L — ABNORMAL HIGH (ref 101–111)
Creatinine, Ser: 0.95 mg/dL (ref 0.61–1.24)
GFR calc Af Amer: >60 mL/min (ref >60)
GFR calc non Af Amer: >60 mL/min (ref >60)
GLUCOSE: 156 mg/dL — AB (ref 65–99)
POTASSIUM: 4.3 mmol/L (ref 3.5–5.1)
SODIUM: 144 mmol/L (ref 135–145)
Total Bilirubin: 0.5 mg/dL (ref 0.3–1.2)
Total Protein: 7.6 g/dL (ref 6.5–8.1)

## 2017-08-24 LAB — CBC
HEMATOCRIT: 43.2 % (ref 39.0–52.0)
HEMOGLOBIN: 14.8 g/dL (ref 13.0–17.0)
MCH: 28.4 pg (ref 26.0–34.0)
MCHC: 34.3 g/dL (ref 30.0–36.0)
MCV: 82.8 fL (ref 78.0–100.0)
Platelets: 305 10*3/uL (ref 150–400)
RBC: 5.22 MIL/uL (ref 4.22–5.81)
RDW: 15 % (ref 11.5–15.5)
WBC: 9.2 10*3/uL (ref 4.0–10.5)

## 2017-08-24 LAB — ACETAMINOPHEN LEVEL: Acetaminophen (Tylenol), Serum: <10 ug/mL — ABNORMAL LOW (ref 10–30)

## 2017-08-24 LAB — RAPID URINE DRUG SCREEN, HOSP PERFORMED
Amphetamines: NOT DETECTED
BARBITURATES: NOT DETECTED
BENZODIAZEPINES: NOT DETECTED
COCAINE: POSITIVE — AB
OPIATES: NOT DETECTED
TETRAHYDROCANNABINOL: POSITIVE — AB

## 2017-08-24 LAB — ETHANOL: Alcohol, Ethyl (B): 109 mg/dL — ABNORMAL HIGH (ref <10)

## 2017-08-24 LAB — SALICYLATE LEVEL

## 2017-08-24 MED ORDER — GABAPENTIN 300 MG PO CAPS
300.0000 mg | ORAL_CAPSULE | Freq: Two times a day (BID) | ORAL | Status: DC
Start: 2017-08-24 — End: 2017-08-24

## 2017-08-24 MED ORDER — LORAZEPAM 2 MG/ML IJ SOLN: 0.0000 mg | Freq: Two times a day (BID) | INTRAMUSCULAR | Status: DC

## 2017-08-24 MED ORDER — ALBUTEROL SULFATE (2.5 MG/3ML) 0.083% IN NEBU
5.0000 mg | INHALATION_SOLUTION | Freq: Once | RESPIRATORY_TRACT | Status: AC
  Administered 2017-08-24: 5 mg via RESPIRATORY_TRACT
  Filled 2017-08-24: qty 6

## 2017-08-24 MED ORDER — PREDNISONE 20 MG PO TABS
40.0000 mg | ORAL_TABLET | Freq: Every day | ORAL | Status: DC
  Administered 2017-08-24: 40 mg via ORAL
  Filled 2017-08-24: qty 2

## 2017-08-24 MED ORDER — HYDROXYZINE HCL 25 MG PO TABS: 50.0000 mg | ORAL_TABLET | Freq: Three times a day (TID) | ORAL | Status: DC | PRN

## 2017-08-24 MED ORDER — AEROCHAMBER PLUS FLO-VU MEDIUM MISC: 1.0000 | Freq: Once | Status: DC

## 2017-08-24 MED ORDER — LORAZEPAM 1 MG PO TABS: 0.0000 mg | ORAL_TABLET | Freq: Two times a day (BID) | ORAL | Status: DC

## 2017-08-24 MED ORDER — IPRATROPIUM BROMIDE 0.02 % IN SOLN
0.5000 mg | Freq: Once | RESPIRATORY_TRACT | Status: AC
  Administered 2017-08-24: 0.5 mg via RESPIRATORY_TRACT
  Filled 2017-08-24: qty 2.5

## 2017-08-24 MED ORDER — BICTEGRAVIR-EMTRICITAB-TENOFOV 50-200-25 MG PO TABS
1.0000 | ORAL_TABLET | Freq: Every day | ORAL | Status: DC
  Administered 2017-08-24: 1 via ORAL
  Filled 2017-08-24: qty 1

## 2017-08-24 MED ORDER — FLUTICASONE PROPIONATE HFA 44 MCG/ACT IN AERO
2.0000 | INHALATION_SPRAY | Freq: Two times a day (BID) | RESPIRATORY_TRACT | Status: DC
  Administered 2017-08-24: 2 via RESPIRATORY_TRACT
  Filled 2017-08-24: qty 10.6

## 2017-08-24 MED ORDER — ALBUTEROL SULFATE HFA 108 (90 BASE) MCG/ACT IN AERS: 1.0000 | INHALATION_SPRAY | RESPIRATORY_TRACT | Status: DC | PRN

## 2017-08-24 MED ORDER — VITAMIN B-1 100 MG PO TABS
100.0000 mg | ORAL_TABLET | Freq: Every day | ORAL | Status: DC
  Administered 2017-08-24: 100 mg via ORAL
  Filled 2017-08-24: qty 1

## 2017-08-24 MED ORDER — LORAZEPAM 1 MG PO TABS
0.0000 mg | ORAL_TABLET | Freq: Four times a day (QID) | ORAL | Status: DC
Start: 2017-08-24 — End: 2017-08-24

## 2017-08-24 MED ORDER — LORAZEPAM 2 MG/ML IJ SOLN: 0.0000 mg | Freq: Four times a day (QID) | INTRAMUSCULAR | Status: DC

## 2017-08-24 MED ORDER — THIAMINE HCL 100 MG/ML IJ SOLN: 100.0000 mg | Freq: Every day | INTRAMUSCULAR | Status: DC

## 2017-08-24 NOTE — ED Triage Notes (Signed)
Pt presented by [facility] for SI with plan of jumping in front of care. [employer] endorsed that pt is currently homeless.
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
## 2017-08-24 NOTE — Patient Outreach (Signed)
[facility] provided contact information to the patient and informed the patient to call/text us regarding any further help regarding substance use recovery resources in the future. Patient is being discharged and [facility] for substance use recovery treatment/transitional housing program.

## 2017-08-24 NOTE — BH Assessment (Signed)
[facility] Assessment Progress Note  Per Thedore MinsMojeed Akintayo, MD, this pt does not require psychiatric hospitalization at this time.  [facility].  This has been included in pt's discharge instructions.  Pt has also been provided with printed directions for taking the [employer] bus to the facility.  Pt's nurse, Aram BeechamCynthia, has been notified.  Doylene Canninghomas Florentine Diekman, MA Triage Specialist [phone]

## 2017-08-24 NOTE — ED Provider Notes (Signed)
Assumed care of patient at shift change, briefly patient presents by [employer] for concern of SI with a plan of jumping in front of a vehicle.  He is intoxicated and homeless. Please see note by Audry Piliyler Mohr PA-C for full details.  Plan: f/u on CXR- if pneumonia treat, if not will need prednisone for asthma exacerbation  Re-eval when sober for SI, if denies then d/c, if has then TTS consult.   Patient re-evaluated at 0700 and is clinically sober.  He is wanting to sleep and be left alone.  I attempted to get him to wake up and eat , he stated "how is breakfast going to make me not want to jump infront of a car."  He also reports that yesterday he cut him self as an attempt to hurt him self.  Lung sounds CTAB.  No respiratory distress.  Will order 5 days of prednisone for asthma exacerbation and place in psych hold.  Patient medically clear.  PRN albuterol ordered.  Home meds ordered. Tdap is UTD.    Cristina GongHammond, Elizabeth W, PA-C 08/24/17 [id number]    Azalia Bilisampos, Kevin, MD 08/24/17 [phone]
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
## 2017-08-24 NOTE — ED Notes (Signed)
Patient reports he was not having any withdrawal symptoms and wanted to go back to sleep.  CIWA to be done later when patient wakes up.

## 2017-08-24 NOTE — ED Provider Notes (Signed)
[facility]-EMERGENCY DEPT Provider Note   CSN: [id number] Arrival date & time: 08/24/17  0122     History   Chief Complaint Chief Complaint  Patient presents with  . Suicidal    HPI Edwin Martinez is a 31 y.o. male.  HPI  32 y.o. male with a hx of Asthma, Bipolar Disorder, HIV, HTN, presents to the Emergency Department today from [employer] due to SI. Notes plan to jump in front of moving vehicle. No HI. Endorses ETOH intake. States he drank "a lot." Also endorses crack usage. Denies pain currently. Notes URI symptoms x 2-3 weeks. Cough with productive sputum. No N/V. No diarrhea.   Level V Caveat; Intoxication  Past Medical History:  Diagnosis Date  . ADHD (attention deficit hyperactivity disorder) 09/12/2012  . Anxiety   . Asthma   . Bipolar 1 disorder (HCC)   . Depression   . HIV (human immunodeficiency virus infection) (HCC) dx'd 2008  . Hypertension   . Schizophrenia (HCC)    "borderline"  . Seizures (HCC)    "used to have little black-out szs where I'd drop out for 2-3 min then come back; nothing in the last 2-3 years" (08/16/2017)    Patient Active Problem List   Diagnosis Date Noted  . Chest tightness   . Cough   . Leukocytosis   . SOB (shortness of breath)   . Nausea vomiting and diarrhea   . Asthma exacerbation 08/15/2017  . Elevated LFTs 08/12/2017  . Cocaine abuse with cocaine-induced mood disorder (HCC) 03/30/2017  . Herpes zoster 03/06/2017  . Polysubstance abuse (HCC) 03/06/2017  . Homelessness 03/06/2017  . Schizoaffective disorder, bipolar type (HCC) 12/23/2016  . Suicidal ideation   . Cannabis use disorder, moderate, dependence (HCC) 07/27/2016  . Tobacco user 07/27/2016  . Intentional drug overdose (HCC) 07/18/2016  . Asthma 05/20/2007  . HIV (human immunodeficiency virus infection) (HCC) 05/05/2007    Past Surgical History:  Procedure Laterality Date  . DENTAL SURGERY     "had my eye teeth pulled down"        Home Medications    Prior to Admission medications   Medication Sig Start Date End Date Taking? Authorizing Provider  albuterol (PROVENTIL HFA;VENTOLIN HFA) 108 (90 Base) MCG/ACT inhaler Inhale 2 puffs into the lungs every 6 (six) hours as needed for wheezing or shortness of breath. 08/17/17  Yes McDiarmid, Leighton Roach, MD  bictegravir-emtricitabine-tenofovir AF (BIKTARVY) 50-200-25 MG TABS tablet Take 1 tablet by mouth daily. 08/18/17  Yes McDiarmid, Leighton Roach, MD  busPIRone (BUSPAR) 5 MG tablet Take 2 tablets (10 mg total) by mouth 2 (two) times daily. 08/17/17  Yes McDiarmid, Leighton Roach, MD  citalopram (CELEXA) 20 MG tablet Take 1 tablet (20 mg total) by mouth daily. 08/17/17  Yes McDiarmid, Leighton Roach, MD  fluticasone (FLOVENT HFA) 44 MCG/ACT inhaler Inhale 2 puffs into the lungs 2 (two) times daily. 08/17/17 08/17/18 Yes McDiarmid, Leighton Roach, MD  gabapentin (NEURONTIN) 300 MG capsule Take 1 capsule (300 mg total) by mouth 3 (three) times daily. 08/17/17  Yes McDiarmid, Leighton Roach, MD  hydrOXYzine (ATARAX/VISTARIL) 50 MG tablet Take 1 tablet (50 mg total) by mouth 3 (three) times daily as needed for anxiety. 08/17/17  Yes McDiarmid, Leighton Roach, MD  paliperidone (INVEGA SUSTENNA) 234 MG/1.5ML SUSP injection Inject 234 mg every 28 (twenty-eight) days into the muscle. 09/07/17  Yes Pucilowska, Jolanta B, MD  traZODone (DESYREL) 100 MG tablet Take 1 tablet (100 mg total) by mouth at bedtime.  08/17/17  Yes McDiarmid, Leighton Roachodd D, MD  predniSONE (DELTASONE) 50 MG tablet Take 1 tablet with breakfast for next 3 days. 08/18/17   McDiarmid, Leighton Roachodd D, MD    Family History Family History  Problem Relation Age of Onset  . Huntington's disease Father   . Heart disease Mother   . Suicidality Maternal Uncle   . Suicidality Maternal Grandmother     Social History Social History   Tobacco Use  . Smoking status: Current Every Day Smoker    Packs/day: 0.50    Years: 20.00    Pack years: 10.00    Types: Cigarettes     Start date: 09/29/1991  . Smokeless tobacco: Never Used  Substance Use Topics  . Alcohol use: Yes    Alcohol/week: 1.8 oz    Types: 3 Cans of beer per week  . Drug use: Yes    Types: Cocaine, Marijuana    Comment: 08/16/2017 "no cocaine in the last couple weeks; use marijuana 1-2 times/year"     Allergies   Magnesium-containing compounds; Peanut-containing drug products; Esomeprazole magnesium; Atripla [efavirenz-emtricitab-tenofovir]; Bactrim [sulfamethoxazole-trimethoprim]; and Penicillins   Review of Systems Review of Systems  Unable to perform ROS: Acuity of condition  Intoxication  Physical Exam Updated Vital Signs BP 109/60 (BP Location: Left Arm)   Pulse 88   Temp 98.6 F (37 C) (Oral)   Resp 18   Ht 5\' 7"  (1.702 m)   Wt 71.7 kg (158 lb)   SpO2 94%   BMI 24.75 kg/m   Physical Exam  Constitutional: He is oriented to person, place, and time. He appears well-developed and well-nourished. No distress.  HENT:  Head: Normocephalic and atraumatic.  Right Ear: Tympanic membrane, external ear and ear canal normal.  Left Ear: Tympanic membrane, external ear and ear canal normal.  Nose: Nose normal.  Mouth/Throat: Uvula is midline, oropharynx is clear and moist and mucous membranes are normal. No trismus in the jaw. No oropharyngeal exudate, posterior oropharyngeal erythema or tonsillar abscesses.  Eyes: EOM are normal. Pupils are equal, round, and reactive to light.  Neck: Normal range of motion. Neck supple. No tracheal deviation present.  Cardiovascular: Normal rate, regular rhythm, S1 normal, S2 normal, normal heart sounds, intact distal pulses and normal pulses.  Pulmonary/Chest: Effort normal. No respiratory distress. He has no decreased breath sounds. He has wheezes in the right upper field, the right lower field, the left upper field and the left lower field. He has rhonchi in the right lower field and the left lower field. He has no rales.  Abdominal: Normal  appearance and bowel sounds are normal. There is no tenderness.  Musculoskeletal: Normal range of motion.  Neurological: He is alert and oriented to person, place, and time.  Skin: Skin is warm and dry.  Psychiatric: He has a normal mood and affect. His speech is normal and behavior is normal. Thought content normal.  Nursing note and vitals reviewed.  ED Treatments / Results  Labs (all labs ordered are listed, but only abnormal results are displayed) Labs Reviewed  COMPREHENSIVE METABOLIC PANEL - Abnormal; Notable for the following components:      Result Value   Chloride 112 (*)    Glucose, Bld 156 (*)    AST 72 (*)    ALT 110 (*)    All other components within normal limits  ETHANOL - Abnormal; Notable for the following components:   Alcohol, Ethyl (B) 109 (*)    All other components within normal limits  ACETAMINOPHEN LEVEL - Abnormal; Notable for the following components:   Acetaminophen (Tylenol), Serum <10 (*)    All other components within normal limits  SALICYLATE LEVEL  CBC  RAPID URINE DRUG SCREEN, HOSP PERFORMED    EKG  EKG Interpretation None       Radiology No results found.  Procedures Procedures (including critical care time)  Medications Ordered in ED Medications - No data to display   Initial Impression / Assessment and Plan / ED Course  I have reviewed the triage vital signs and the nursing notes.  Pertinent labs & imaging results that were available during my care of the patient were reviewed by me and considered in my medical decision making (see chart for details).  Final Clinical Impressions(s) / ED Diagnoses  {I have reviewed and evaluated the relevant laboratory values. {I have reviewed and evaluated the relevant imaging studies.  {I have reviewed the relevant previous healthcare records.  {I obtained HPI from historian.   ED Course:  Assessment: Pt is a 31 y.o. male with a hx of Asthma, Bipolar Disorder, HIV, HTN, presents to the  Emergency Department today from [employer] due to SI. Notes plan to jump in front of moving vehicle. No HI. Endorses ETOH intake. States he drank "a lot." Also endorses crack usage. Denies pain currently. Notes URI symptoms x 2-3 weeks. Cough with productive sputum. No N/V. No diarrhea. On exam, pt in NAD. Nontoxic/nonseptic appearing. VSS. Afebrile. Lungs bilateral wheeze. Heart RRR. Abdomen nontender soft. CXR pending. Given neb treatment as well as prednisone in ED. Plan is to Reassess once clinically sober.    Disposition/Plan:  Reassess once sober for SI 6:00 AM Sign out to Lyndel SafeElizabeth Hammond, PA-C   Supervising Physician Azalia Bilisampos, Kevin, MD  Final diagnoses:  Mild intermittent asthma with exacerbation  Alcoholic intoxication without complication [facility](HCC)    ED Discharge Orders    None       Audry PiliMohr, Trameka Dorough, PA-C 08/24/17 0600    Azalia Bilisampos, Kevin, MD 08/24/17 [phone]

## 2017-08-24 NOTE — BHH Suicide Risk Assessment (Signed)
Suicide Risk Assessment  Discharge Assessment   [facility]   Principal Problem: Cocaine abuse with cocaine-induced mood disorder [facility](HCC) Discharge Diagnoses:  Patient Active Problem List   Diagnosis Date Noted  . Suicidal ideation [R45.851]     Priority: High  . Chest tightness [R07.89]   . Cough [R05]   . Leukocytosis [D72.829]   . SOB (shortness of breath) [R06.02]   . Nausea vomiting and diarrhea [R11.2, R19.7]   . Asthma exacerbation [J45.901] 08/15/2017  . Elevated LFTs [R94.5] 08/12/2017  . 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 Herpes zoster [B02.9] 03/06/2017  . Polysubstance abuse (HCC) [F19.10] 03/06/2017  . Homelessness [Z59.0] 03/06/2017  . Schizoaffective disorder, bipolar type (HCC) [F25.0] 12/23/2016  . Cannabis use disorder, moderate, dependence (HCC) [F12.20] 07/27/2016  . Tobacco user [Z72.0] 07/27/2016  . Intentional drug overdose (HCC) [T50.902A] 07/18/2016  . Asthma [J45.909] 05/20/2007  . HIV (human immunodeficiency virus infection) (HCC) [B20] 05/05/2007    Total Time spent with patient: 45 minutes  Musculoskeletal: Strength & Muscle Tone: within normal limits Gait & Station: normal Patient leans: N/A  Psychiatric Specialty Exam:   Blood pressure 121/63, pulse 84, temperature 99.1 F (37.3 C), temperature source Oral, resp. rate 20, height 5\' 7"  (1.702 m), weight 71.7 kg (158 lb), SpO2 92 %.Body mass index is 24.75 kg/m.  General Appearance: Casual  Eye Contact::  Good  Speech:  Clear and Coherent409  Volume:  Normal  Mood:  Anxious, Depressed and Hopeless  Affect:  Congruent and Depressed  Thought Process:  Coherent, Goal Directed and Linear  Orientation:  Full (Time, Place, and Person)  Thought Content:  Logical  Suicidal Thoughts:  No  Homicidal Thoughts:  No  Memory:  Immediate;   Good Recent;   Good Remote;   Fair  Judgement:  Poor  Insight:  Fair  Psychomotor  Activity:  Normal  Concentration:  Good  Recall:  Good  Fund of Knowledge:Good  Language: Good  Akathisia:  No  Handed:  Right  AIMS (if indicated):     Assets:  Communication Skills Desire for Improvement Physical Health Social Support  Sleep:     Cognition: WNL  ADL's:  Intact   Mental Status Per Nursing Assessment::   On Admission:     Demographic Factors:  Low socioeconomic status and Unemployed  Loss Factors: Financial problems/change in socioeconomic status  Historical Factors: Impulsivity  Risk Reduction Factors:   Sense of responsibility to family  Continued Clinical Symptoms:  Alcohol/Substance Abuse/Dependencies  Cognitive Features That Contribute To Risk:  Closed-mindedness    Suicide Risk:  Mild:  Suicidal ideation of limited frequency, intensity, duration, and specificity.  There are no identifiable plans, no associated intent, mild dysphoria and related symptoms, good self-control (both objective and subjective assessment), few other risk factors, and identifiable protective factors, including available and accessible social support.    Plan Of Care/Follow-up recommendations:  Activity:  as tolerated Diet:  Heart Healthy  Laveda AbbeLaurie Britton Bertha Lokken, NP 08/24/2017, 11:42 AM

## 2017-08-24 NOTE — Discharge Instructions (Addendum)
To help you maintain a sober lifestyle, a substance abuse treatment program may be beneficial to you.  Contact [facility] today to ask about enrolling in their program. [facility]:       [facility] [address] [address]      [address]      [place], [place] [address]      [phone]

## 2017-08-24 NOTE — ED Notes (Signed)
Bed: WLPT3 Expected date:  Expected time:  Means of arrival:  Comments: 

## 2017-08-24 NOTE — ED Notes (Signed)
Patient transported to X-ray 

## 2017-08-24 NOTE — Progress Notes (Addendum)
TTS [facility].  [phone].  She has openings and agreed to do phone interview with pt.  Pt completed phone interview and is interested in the program.  Pt is not allowed to take medication at this program, but pt was willing to try this as well. Pt indicates that he can contract for safety if he is off the streets and has a place to be.  TTS discussed this with Dr Mervyn SkeetersA and Elta GuadeloupeLaurie Parks, NP and plan will be to discharge pt to this program. TTS confirmed with Harlow [facility]ton that pt will be arriving by bus later today and they will be expecting him. Garner NashGregory Bettylou Frew, MSW, LCSW Clinical Social Worker 08/24/2017 11:38 AM

## 2017-09-23 ENCOUNTER — Telehealth (HOSPITAL_COMMUNITY): Payer: Self-pay

## 2017-09-30 ENCOUNTER — Ambulatory Visit: Payer: Self-pay

## 2017-10-14 ENCOUNTER — Ambulatory Visit: Payer: Self-pay | Admitting: Internal Medicine

## 2017-11-08 ENCOUNTER — Ambulatory Visit (INDEPENDENT_AMBULATORY_CARE_PROVIDER_SITE_OTHER): Payer: Self-pay | Admitting: Pharmacist Clinician (PhC)/ Clinical Pharmacy Specialist

## 2017-11-08 ENCOUNTER — Encounter: Payer: Self-pay | Admitting: Internal Medicine

## 2017-11-08 ENCOUNTER — Encounter (HOSPITAL_COMMUNITY): Payer: Self-pay | Admitting: Emergency Medicine

## 2017-11-08 ENCOUNTER — Emergency Department (HOSPITAL_COMMUNITY)
Admission: EM | Admit: 2017-11-08 | Discharge: 2017-11-08 | Disposition: A | Discharge status: Home / Self Care | Payer: Self-pay | Attending: Emergency Medicine | Admitting: Emergency Medicine

## 2017-11-08 DIAGNOSIS — Z79899 Other long term (current) drug therapy: Secondary | ICD-10-CM | POA: Insufficient documentation

## 2017-11-08 DIAGNOSIS — F333 Major depressive disorder, recurrent, severe with psychotic symptoms: Secondary | ICD-10-CM | POA: Insufficient documentation

## 2017-11-08 DIAGNOSIS — J45909 Unspecified asthma, uncomplicated: Secondary | ICD-10-CM | POA: Insufficient documentation

## 2017-11-08 DIAGNOSIS — I10 Essential (primary) hypertension: Secondary | ICD-10-CM | POA: Insufficient documentation

## 2017-11-08 DIAGNOSIS — F329 Major depressive disorder, single episode, unspecified: Secondary | ICD-10-CM

## 2017-11-08 DIAGNOSIS — B20 Human immunodeficiency virus [HIV] disease: Secondary | ICD-10-CM

## 2017-11-08 DIAGNOSIS — F1420 Cocaine dependence, uncomplicated: Secondary | ICD-10-CM | POA: Insufficient documentation

## 2017-11-08 DIAGNOSIS — R45851 Suicidal ideations: Secondary | ICD-10-CM | POA: Insufficient documentation

## 2017-11-08 DIAGNOSIS — F32A Depression, unspecified: Secondary | ICD-10-CM

## 2017-11-08 DIAGNOSIS — B182 Chronic viral hepatitis C: Secondary | ICD-10-CM

## 2017-11-08 DIAGNOSIS — F17210 Nicotine dependence, cigarettes, uncomplicated: Secondary | ICD-10-CM

## 2017-11-08 DIAGNOSIS — Z23 Encounter for immunization: Secondary | ICD-10-CM

## 2017-11-08 DIAGNOSIS — R440 Auditory hallucinations: Secondary | ICD-10-CM | POA: Insufficient documentation

## 2017-11-08 DIAGNOSIS — F1210 Cannabis abuse, uncomplicated: Secondary | ICD-10-CM | POA: Insufficient documentation

## 2017-11-08 DIAGNOSIS — Z046 Encounter for general psychiatric examination, requested by authority: Secondary | ICD-10-CM | POA: Insufficient documentation

## 2017-11-08 LAB — COMPREHENSIVE METABOLIC PANEL
ALBUMIN: 4.3 g/dL (ref 3.5–5.0)
ALK PHOS: 76 U/L (ref 38–126)
ALT: 102 U/L — ABNORMAL HIGH (ref 17–63)
ANION GAP: 12 (ref 5–15)
AST: 63 U/L — ABNORMAL HIGH (ref 15–41)
BILIRUBIN TOTAL: 0.7 mg/dL (ref 0.3–1.2)
BUN: 8 mg/dL (ref 6–20)
CALCIUM: 9.3 mg/dL (ref 8.9–10.3)
CO2: 22 mmol/L (ref 22–32)
Chloride: 105 mmol/L (ref 101–111)
Creatinine, Ser: 0.88 mg/dL (ref 0.61–1.24)
GFR calc Af Amer: >60 mL/min (ref >60)
GFR calc non Af Amer: >60 mL/min (ref >60)
GLUCOSE: 93 mg/dL (ref 65–99)
Potassium: 3.8 mmol/L (ref 3.5–5.1)
Sodium: 139 mmol/L (ref 135–145)
TOTAL PROTEIN: 8.1 g/dL (ref 6.5–8.1)

## 2017-11-08 LAB — CBC
HEMATOCRIT: 47 % (ref 39.0–52.0)
Hemoglobin: 16 g/dL (ref 13.0–17.0)
MCH: 29 pg (ref 26.0–34.0)
MCHC: 34 g/dL (ref 30.0–36.0)
MCV: 85.1 fL (ref 78.0–100.0)
PLATELETS: 256 10*3/uL (ref 150–400)
RBC: 5.52 MIL/uL (ref 4.22–5.81)
RDW: 13.8 % (ref 11.5–15.5)
WBC: 13 10*3/uL — ABNORMAL HIGH (ref 4.0–10.5)

## 2017-11-08 LAB — RAPID URINE DRUG SCREEN, HOSP PERFORMED
AMPHETAMINES: NOT DETECTED
BARBITURATES: NOT DETECTED
Benzodiazepines: NOT DETECTED
Cocaine: POSITIVE — AB
Opiates: NOT DETECTED
Tetrahydrocannabinol: POSITIVE — AB

## 2017-11-08 LAB — ACETAMINOPHEN LEVEL: Acetaminophen (Tylenol), Serum: <10 ug/mL — ABNORMAL LOW (ref 10–30)

## 2017-11-08 LAB — ETHANOL: Alcohol, Ethyl (B): <10 mg/dL (ref <10)

## 2017-11-08 LAB — SALICYLATE LEVEL: Salicylate Lvl: <7 mg/dL (ref 2.8–30.0)

## 2017-11-08 MED ORDER — BICTEGRAVIR-EMTRICITAB-TENOFOV 50-200-25 MG PO TABS: 1.0000 | ORAL_TABLET | Freq: Every day | ORAL | 2 refills | Status: DC

## 2017-11-08 NOTE — ED Provider Notes (Signed)
[facility] EMERGENCY DEPARTMENT Provider Note   CSN: [id number] Arrival date & time: 11/08/17  1324     History   Chief Complaint Chief Complaint  Patient presents with  . Suicidal    HPI Edwin Martinez is a 32 y.o. male.  The history is provided by the patient and medical records.    32 year old male with history of ADHD, anxiety, asthma, bipolar disorder, depression, hypertension, schizophrenia, HIV, presenting to the ED with suicidal thoughts with plan to overdose.  Patient states his aunt who he is close with died a few days ago after a massive heart attack in her sleep.  States this was very devastating to him as this was unexpected.  States he has been able to talk with his family but only a little bit as they currently live in [place] and parts of [place] and he is rather far away and has no means to get to them.  States 2 days ago he took an entire bottle of Excedrin with intention of killing himself.  States it made him throw up multiple times and made him feel awful.  He has not taken any other medications.  He denies any homicidal ideation.  States he has been hearing some voices, but knows this is because he has been off of his medication for about 2 months.  He denies any visual hallucinations.  Denies any excessive alcohol or illicit drug use.  Past Medical History:  Diagnosis Date  . ADHD (attention deficit hyperactivity disorder) 09/12/2012  . Anxiety   . Asthma   . Bipolar 1 disorder (HCC)   . Depression   . HIV (human immunodeficiency virus infection) (HCC) dx'd 2008  . Hypertension   . Schizophrenia (HCC)    "borderline"  . Seizures (HCC)    "used to have little black-out szs where I'd drop out for 2-3 min then come back; nothing in the last 2-3 years" (08/16/2017)    Patient Active Problem List   Diagnosis Date Noted  . Chest tightness   . Cough   . Leukocytosis   . SOB (shortness of breath)   . Nausea vomiting and diarrhea    . Asthma exacerbation 08/15/2017  . Elevated LFTs 08/12/2017  . Cocaine abuse with cocaine-induced mood disorder (HCC) 03/30/2017  . Herpes zoster 03/06/2017  . Polysubstance abuse (HCC) 03/06/2017  . Homelessness 03/06/2017  . Schizoaffective disorder, bipolar type (HCC) 12/23/2016  . Suicidal ideation   . Cannabis use disorder, moderate, dependence (HCC) 07/27/2016  . Tobacco user 07/27/2016  . Intentional drug overdose (HCC) 07/18/2016  . Asthma 05/20/2007  . HIV (human immunodeficiency virus infection) (HCC) 05/05/2007    Past Surgical History:  Procedure Laterality Date  . DENTAL SURGERY     "had my eye teeth pulled down"       Home Medications    Prior to Admission medications   Medication Sig Start Date End Date Taking? Authorizing Provider  albuterol (PROVENTIL HFA;VENTOLIN HFA) 108 (90 Base) MCG/ACT inhaler Inhale 2 puffs into the lungs every 6 (six) hours as needed for wheezing or shortness of breath. 08/17/17   McDiarmid, Leighton Roach, MD  bictegravir-emtricitabine-tenofovir AF (BIKTARVY) 50-200-25 MG TABS tablet Take 1 tablet by mouth daily. 08/18/17   McDiarmid, Leighton Roach, MD  busPIRone (BUSPAR) 5 MG tablet Take 2 tablets (10 mg total) by mouth 2 (two) times daily. 08/17/17   McDiarmid, Leighton Roach, MD  citalopram (CELEXA) 20 MG tablet Take 1 tablet (20 mg total) by mouth  daily. 08/17/17   McDiarmid, Leighton Roach, MD  fluticasone (FLOVENT HFA) 44 MCG/ACT inhaler Inhale 2 puffs into the lungs 2 (two) times daily. 08/17/17 08/17/18  McDiarmid, Leighton Roach, MD  gabapentin (NEURONTIN) 300 MG capsule Take 1 capsule (300 mg total) by mouth 3 (three) times daily. 08/17/17   McDiarmid, Leighton Roach, MD  hydrOXYzine (ATARAX/VISTARIL) 50 MG tablet Take 1 tablet (50 mg total) by mouth 3 (three) times daily as needed for anxiety. 08/17/17   McDiarmid, Leighton Roach, MD  paliperidone (INVEGA SUSTENNA) 234 MG/1.5ML SUSP injection Inject 234 mg every 28 (twenty-eight) days into the muscle. 09/07/17   Pucilowska,  Braulio Conte B, MD  predniSONE (DELTASONE) 50 MG tablet Take 1 tablet with breakfast for next 3 days. 08/18/17   McDiarmid, Leighton Roach, MD  traZODone (DESYREL) 100 MG tablet Take 1 tablet (100 mg total) by mouth at bedtime. 08/17/17   McDiarmid, Leighton Roach, MD    Family History Family History  Problem Relation Age of Onset  . Huntington's disease Father   . Heart disease Mother   . Suicidality Maternal Uncle   . Suicidality Maternal Grandmother     Social History Social History   Tobacco Use  . Smoking status: Current Every Day Smoker    Packs/day: 0.50    Years: 20.00    Pack years: 10.00    Types: Cigarettes    Start date: 09/29/1991  . Smokeless tobacco: Never Used  Substance Use Topics  . Alcohol use: Yes    Alcohol/week: 1.8 oz    Types: 3 Cans of beer per week  . Drug use: Yes    Types: Cocaine, Marijuana     Allergies   Magnesium-containing compounds; Peanut-containing drug products; Esomeprazole magnesium; Atripla [efavirenz-emtricitab-tenofovir]; Bactrim [sulfamethoxazole-trimethoprim]; and Penicillins   Review of Systems Review of Systems  Psychiatric/Behavioral: Positive for suicidal ideas.  All other systems reviewed and are negative.    Physical Exam Updated Vital Signs BP 135/77 (BP Location: Right Arm)   Pulse 80   Temp 97.6 F (36.4 C) (Oral)   Resp 17   Ht 5\' 7"  (1.702 m)   Wt 72.6 kg (160 lb)   SpO2 99%   BMI 25.06 kg/m   Physical Exam  Constitutional: He is oriented to person, place, and time. He appears well-developed and well-nourished.  HENT:  Head: Normocephalic and atraumatic.  Mouth/Throat: Oropharynx is clear and moist.  Eyes: Conjunctivae and EOM are normal. Pupils are equal, round, and reactive to light.  Neck: Normal range of motion.  Cardiovascular: Normal rate, regular rhythm and normal heart sounds.  Pulmonary/Chest: Effort normal and breath sounds normal. No stridor. No respiratory distress.  Abdominal: Soft. Bowel sounds are  normal.  Musculoskeletal: Normal range of motion.  Neurological: He is alert and oriented to person, place, and time.  Skin: Skin is warm and dry.  Psychiatric: He has a normal mood and affect. He is actively hallucinating. He expresses suicidal ideation. He expresses no homicidal ideation. He expresses suicidal plans. He expresses no homicidal plans.  Nursing note and vitals reviewed.    ED Treatments / Results  Labs (all labs ordered are listed, but only abnormal results are displayed) Labs Reviewed  COMPREHENSIVE METABOLIC PANEL - Abnormal; Notable for the following components:      Result Value   AST 63 (*)    ALT 102 (*)    All other components within normal limits  ACETAMINOPHEN LEVEL - Abnormal; Notable for the following components:   Acetaminophen (Tylenol), Serum <10 (*)  All other components within normal limits  CBC - Abnormal; Notable for the following components:   WBC 13.0 (*)    All other components within normal limits  RAPID URINE DRUG SCREEN, HOSP PERFORMED - Abnormal; Notable for the following components:   Cocaine POSITIVE (*)    Tetrahydrocannabinol POSITIVE (*)    All other components within normal limits  ETHANOL  SALICYLATE LEVEL    EKG  EKG Interpretation None       Radiology No results found.  Procedures Procedures (including critical care time)  Medications Ordered in ED Medications - No data to display   Initial Impression / Assessment and Plan / ED Course  I have reviewed the triage vital signs and the nursing notes.  Pertinent labs & imaging results that were available during my care of the patient were reviewed by me and considered in my medical decision making (see chart for details).  32 y.o. M here with SI, plan to OD.  Reports he took bottle of excedrin 2 days ago with intentions of killing himself.  Reported some vomiting then, none in the past 24 hours.  Reports stressors over his aunt passing away.  Continues to report SI  here with auditory hallucinations which he has had before.  No HI or visual hallucinations.  Has been off meds for about 2 months.  Screening labs overall reassuring-- elevation of LFT's, similar to prior values.  Medically cleared.  Awaiting TTS evaluation.  6:01 AM TTS has evaluated-- recommends observation and reassess by AM psych team.  Patient remains stable at this time.  Final Clinical Impressions(s) / ED Diagnoses   Final diagnoses:  Suicidal ideation    ED Discharge Orders    None       Garlon HatchetSanders, Alann Avey M, PA-C 11/08/17 0615    Palumbo, April, MD 11/08/17 [phone]

## 2017-11-08 NOTE — ED Notes (Signed)
Belongings removed, pt placed in paper scrubs. Wanded by security. Staffing called to request sitter

## 2017-11-08 NOTE — BH Assessment (Addendum)
Tele Assessment Note   Patient Name: Edwin Martinez MRN: [id number] Referring Physician: Misty Stanley, [place].  Location of Patient: [facility] Location of Provider: Behavioral Health TTS Department  Edwin Martinez is an 32 y.o. male, who presents voluntary and unaccompanied to [facility]. Clinician asked the pt, "what brought you to the hospital?" Pt reported, he is suicidal, "just had death in family, my aunt died two days ago." Pt reported, he was at [employer], checked his Facebook, his cousin messaged him that his aunt died. Pt reported, the day his aunt died he took entire bottle of Excedrin. Pt reported, he threw up, went to [facility], medical center and learned he had a fever of 102. Pt reported, he was allow to rest in their lobby. Pt reported, today he passed a bridge and thought about jumping. Pt reported, "I been out of my mind, out of place,  forgetting appointments, walking the streets for the past 2-3 months." Pt reported, the following stressors: "no family/friends, no peer supports, alone." Pt reported, hearing voices for years, telling him "you're no good, no one wants to me around me." Pt denies, HI, self-injurious behaviors, access to weapons.   Pt denies abuse. Pt reported, smoking a half a pack of cigarettes, daily. Pt reported, smoking a "dime," of crack, tonight. Pt denies current use of marijuana. Pt's UDS is positive for cocaine, and marijuana. Pt reported not taking medications for mental health or HIV in 2-3 months. Pt reported, previous inpatient admissions at [facility].   Pt presents alert in scrubs with logical/coherent speech. Pt's eye contact was good. Pt's mood was depressed. Pt's affect was appropriate to circumstance. Pt's thought process was coherent/relevant. Pt's judgement was parital. Pt was oriented x4. Pt's concentration was normal. Pt's insight  Was fair., Pt's impulse control was poor. Pt reported, if discharged [facility] he could not contract  for safety. Pt reported, if inpatient treatment was recommended he would sign-in voluntarily.   Diagnosis: F33.3 Major Depressive Disorder, recurrent episode, severe with psychotic features.                       F14.20 Cocaine use Disorder, moderate.                         Past Medical History:  Past Medical History:  Diagnosis Date  . ADHD (attention deficit hyperactivity disorder) 09/12/2012  . Anxiety   . Asthma   . Bipolar 1 disorder (HCC)   . Depression   . HIV (human immunodeficiency virus infection) (HCC) dx'd 2008  . Hypertension   . Schizophrenia (HCC)    "borderline"  . Seizures (HCC)    "used to have little black-out szs where I'd drop out for 2-3 min then come back; nothing in the last 2-3 years" (08/16/2017)    Past Surgical History:  Procedure Laterality Date  . DENTAL SURGERY     "had my eye teeth pulled down"    Family History:  Family History  Problem Relation Age of Onset  . Huntington's disease Father   . Heart disease Mother   . Suicidality Maternal Uncle   . Suicidality Maternal Grandmother     Social History:  reports that he has been smoking cigarettes.  He started smoking about 26 years ago. He has a 10.00 pack-year smoking history. he has never used smokeless tobacco. He reports that he drinks about 1.8 oz of alcohol per week. He  reports that he uses drugs. Drugs: Cocaine and Marijuana.  Additional Social History:  Alcohol / Drug Use Pain Medications: See MAR Prescriptions: See MAR Over the Counter: See MAR History of alcohol / drug use?: Yes Substance #1 Name of Substance 1: Cigarettes. 1 - Age of First Use: UTA 1 - Amount (size/oz): Pt reported, smoking a half a pack pf cigarettes, daily.  1 - Frequency: Daily.  1 - Duration: Ongoing.  1 - Last Use / Amount: Pt reported, daily.  Substance #2 Name of Substance 2: Crack 2 - Age of First Use: UTA 2 - Amount (size/oz): Pt reported, smoking a "dime" of crack, tonight."  2 - Frequency:  UTA 2 - Duration: UTA 2 - Last Use / Amount: Pt reported, tonight.  Substance #3 Name of Substance 3: Marijuana. 3 - Age of First Use: UTA 3 - Amount (size/oz): Pt reported, "its been a while," since he smoked marijuana. Pt's UDS is positive for marijuana.  3 - Frequency: UTA 3 - Duration: UTA 3 - Last Use / Amount: UTA  CIWA: CIWA-Ar BP: 128/72 Pulse Rate: 81 COWS:    Allergies:  Allergies  Allergen Reactions  . Magnesium-Containing Compounds Other (See Comments)    This medication is contraindicated with pts HIV meds.    . Peanut-Containing Drug Products Anaphylaxis  . Esomeprazole Magnesium Cough  . Atripla [Efavirenz-Emtricitab-Tenofovir] Other (See Comments)    Reaction:  Suicidal thoughts   . Bactrim [Sulfamethoxazole-Trimethoprim] Rash  . Penicillins Rash and Other (See Comments)    Has patient had a PCN reaction causing immediate rash, facial/tongue/throat swelling, SOB or lightheadedness with hypotension: Yes Has patient had a PCN reaction causing severe rash involving mucus membranes or skin necrosis: No Has patient had a PCN reaction that required hospitalization No Has patient had a PCN reaction occurring within the last 10 years: No If all of the above answers are "NO", then may proceed with Cephalosporin use.    Home Medications:  (Not in a hospital admission)  OB/GYN Status:  No LMP for male patient.  General Assessment Data Location of Assessment: [facility] ED TTS Assessment: In system Is this a Tele or Face-to-Face Assessment?: Tele Assessment Is this an Initial Assessment or a Re-assessment for this encounter?: Initial Assessment Marital status: Single Living Arrangements: Other (Comment)(Homeless) Can pt return to current living arrangement?: Yes Admission Status: Voluntary Is patient capable of signing voluntary admission?: Yes Referral Source: Self/Family/Friend Insurance type: Self-pay.      Crisis Care Plan Living Arrangements: Other  (Comment)(Homeless) Legal Guardian: Other:(Self. ) Name of Psychiatrist: NA Name of Therapist: NA  Education Status Is patient currently in school?: No Current Grade: NA Highest grade of school patient has completed: 8th grade.  Name of school: NA Contact person: NA  Risk to self with the past 6 months Suicidal Ideation: Yes-Currently Present Has patient been a risk to self within the past 6 months prior to admission? : Yes Suicidal Intent: Yes-Currently Present Has patient had any suicidal intent within the past 6 months prior to admission? : Yes Is patient at risk for suicide?: Yes Suicidal Plan?: Yes-Currently Present Has patient had any suicidal plan within the past 6 months prior to admission? : Yes Specify Current Suicidal Plan: Pt walked passed a brigde and thought about jumping. Pt overdosed on Excedrin. Access to Means: Yes Specify Access to Suicidal Means: Pt has access to bridges and Excedrin.  What has been your use of drugs/alcohol within the last 12 months?: Cigarettes, Crack, and Marijuana.  Previous Attempts/Gestures: Yes How many times?: (Pt reported, 4-5 times.) Other Self Harm Risks: Pt denies.  Triggers for Past Attempts: Unknown Intentional Self Injurious Behavior: None(Pt denies. ) Family Suicide History: Unknown Recent stressful life event(s): Loss (Comment)(Homeless, death of aunt, no natural supports, lonely. ) Persecutory voices/beliefs?: Yes Depression: Yes Depression Symptoms: Feeling angry/irritable, Feeling worthless/self pity, Loss of interest in usual pleasures, Guilt, Fatigue, Isolating, Tearfulness, Insomnia Substance abuse history and/or treatment for substance abuse?: Yes Suicide prevention information given to non-admitted patients: Not applicable  Risk to Others within the past 6 months Homicidal Ideation: No(Pt denies. ) Does patient have any lifetime risk of violence toward others beyond the six months prior to admission? : No(Pt denies.  ) Thoughts of Harm to Others: No Current Homicidal Intent: No Current Homicidal Plan: No Access to Homicidal Means: No Identified Victim: NA History of harm to others?: No Assessment of Violence: None Noted Violent Behavior Description: NA Does patient have access to weapons?: No(Pt denies. ) Criminal Charges Pending?: No Does patient have a court date: No Is patient on probation?: No  Psychosis Hallucinations: Auditory Delusions: None noted  Mental Status Report Appearance/Hygiene: In scrubs Eye Contact: Good Motor Activity: Unremarkable, Freedom of movement Speech: Logical/coherent Level of Consciousness: Alert Mood: Depressed Affect: Appropriate to circumstance Anxiety Level: None Thought Processes: Coherent, Relevant Judgement: Partial Orientation: Person, Place, Time, Situation Obsessive Compulsive Thoughts/Behaviors: None  Cognitive Functioning Concentration: Normal Memory: Recent Intact IQ: Average Insight: Fair Impulse Control: Fair Appetite: Poor Sleep: Decreased Total Hours of Sleep: 3 Vegetative Symptoms: None  ADLScreening [facility] Assessment Services) Patient's cognitive ability adequate to safely complete daily activities?: Yes Patient able to express need for assistance with ADLs?: Yes Independently performs ADLs?: Yes (appropriate for developmental age)  Prior Inpatient Therapy Prior Inpatient Therapy: Yes Prior Therapy Dates: Pt reported, a couple months ago.  Prior Therapy Facilty/Provider(s): [facility], [place], [place].  Reason for Treatment: Depression, SI.   Prior Outpatient Therapy Prior Outpatient Therapy: Yes Prior Therapy Dates: UTA Prior Therapy Facilty/Provider(s): [name]. Reason for Treatment: Medication.  Does patient have an ACCT team?: No Does patient have Intensive In-House Services?  : No Does patient have [name] services? : No(Pt has not followed up in two months. ) Does patient have P4CC services?: No  ADL Screening  (condition at time of admission) Patient's cognitive ability adequate to safely complete daily activities?: Yes Is the patient deaf or have difficulty hearing?: No Does the patient have difficulty seeing, even when wearing glasses/contacts?: No Does the patient have difficulty concentrating, remembering, or making decisions?: Yes Patient able to express need for assistance with ADLs?: Yes Does the patient have difficulty dressing or bathing?: No Independently performs ADLs?: Yes (appropriate for developmental age) Does the patient have difficulty walking or climbing stairs?: No Weakness of Legs: None Weakness of Arms/Hands: None  Home Assistive Devices/Equipment Home Assistive Devices/Equipment: None    Abuse/Neglect Assessment (Assessment to be complete while patient is alone) Abuse/Neglect Assessment Can Be Completed: Yes Physical Abuse: Denies(Pt denies. ) Verbal Abuse: Denies(Pt denies. ) Sexual Abuse: Denies(Pt denies. ) Exploitation of patient/patient's resources: Denies(Pt denies. ) Self-Neglect: Denies(Pt denies. )     Advance Directives (For Healthcare) Does Patient Have a Medical Advance Directive?: (UTA)    Additional Information 1:1 In Past 12 Months?: No CIRT Risk: No Elopement Risk: No Does patient have medical clearance?: Yes     Disposition: Nira Conn, NP recommends overnight observation for safety and stabilization. Disposition discussed with Misty Stanley, PA and Reita Cliche, Charity fundraiser.  Disposition Initial Assessment Completed for this Encounter: Yes Disposition of Patient: Re-evaluation by Psychiatry recommended  This service was provided via telemedicine using a 2-way, interactive audio and video technology.  Names of all persons participating in this telemedicine service and their role in this encounter.               Redmond Pullingreylese D Taryll Reichenberger 11/08/2017 6:23 AM   Redmond Pullingreylese D Darienne Belleau, MS, [facility], CRC Triage Specialist [phone]

## 2017-11-08 NOTE — ED Notes (Signed)
TTS interview completed .  

## 2017-11-08 NOTE — Consult Note (Signed)
Telepsych Consultation   Reason for Consult:   Suicidal ideations Referring Physician:  EPD Location of Patient: F10 Location of Provider: [facility]  Patient Identification: Edwin Martinez MRN:  [id number] Principal Diagnosis: Depression Diagnosis:   Patient Active Problem List   Diagnosis Date Noted  . Depression [F32.9] 11/08/2017  . Chest tightness [R07.89]   . Cough [R05]   . Leukocytosis [D72.829]   . SOB (shortness of breath) [R06.02]   . Nausea vomiting and diarrhea [R11.2, R19.7]   . Asthma exacerbation [J45.901] 08/15/2017  . Elevated LFTs [R94.5] 08/12/2017  . Cocaine abuse with cocaine-induced mood disorder ([place]) [F14.14] 03/30/2017  . Herpes zoster [B02.9] 03/06/2017  . Polysubstance abuse ([place]) [F19.10] 03/06/2017  . Homelessness [Z59.0] 03/06/2017  . Schizoaffective disorder, bipolar type ([place]) [F25.0] 12/23/2016  . Suicidal ideation [R45.851]   . Cannabis use disorder, moderate, dependence ([place]) [F12.20] 07/27/2016  . Tobacco user [Z72.0] 07/27/2016  . Intentional drug overdose ([place]) [T50.902A] 07/18/2016  . Asthma [J45.909] 05/20/2007  . HIV (human immunodeficiency virus infection) ([place]) [B20] 05/05/2007    Total Time spent with patient: 20 minutes  Subjective:   Edwin Martinez is a 32 y.o. male seen via tele assessment. Patient reports he is extremely depressed. States his depression was has been getting worse and the passing of is aunt made him stressed. Reports he feels as if his in a cycle. Reports he hasn't [facility] with [facility] for medications as he was on a monthly injection. Patient is requesting 2 bus passes too follow-up with [facility] today.  Chart reviewed and Case discussed with treatment team. Support, Encouragement and Reassurances was provided.   HPI: Per assessment note-  Edwin Martinez is an 32 y.o. male, who presents voluntary and unaccompanied to [facility]. Clinician asked the pt, "what brought you to the  hospital?" Pt reported, he is suicidal, "just had death in family, my aunt died two days ago." Pt reported, he was at [employer], checked his Facebook, his cousin messaged him that his aunt died. Pt reported, the day his aunt died he took entire bottle of Excedrin. Pt reported, he threw up, went to [facility] medical center and learned he had a fever of 102. Pt reported, he was allow to rest in their lobby. Pt reported, today he passed a bridge and thought about jumping. Pt reported, "I been out of my mind, out of place,  forgetting appointments, walking the streets for the past 2-3 months." Pt reported, the following stressors: "no family/friends, no peer supports, alone." Pt reported, hearing voices for years, telling him "you're no good, no one wants to me around me." Pt denies, HI, self-injurious behaviors, access to weapons.   Pt denies abuse. Pt reported, smoking a half a pack of cigarettes, daily. Pt reported, smoking a "dime," of crack, tonight. Pt denies current use of marijuana. Pt's UDS is positive for cocaine, and marijuana. Pt reported not taking medications for mental health or HIV in 2-3 months. Pt reported, previous inpatient admissions at [facility].   Pt presents alert in scrubs with logical/coherent speech. Pt's eye contact was good. Pt's mood was depressed. Pt's affect was appropriate to circumstance. Pt's thought process was coherent/relevant. Pt's judgement was parital. Pt was oriented x4. Pt's concentration was normal. Pt's insight  Was fair., Pt's impulse control was poor. Pt reported, if discharged from [facility] he could not contract for safety. Pt reported, if inpatient treatment was recommended he would sign-in voluntarily.  Past Psychiatric History:  Risk to Self: Suicidal Ideation: Yes-Currently Present Suicidal Intent: Yes-Currently Present Is patient at risk for suicide?: Yes Suicidal Plan?: Yes-Currently Present Specify Current Suicidal  Plan: Pt walked passed a brigde and thought about jumping. Pt overdosed on Excedrin. Access to Means: Yes Specify Access to Suicidal Means: Pt has access to bridges and Excedrin.  What has been your use of drugs/alcohol within the last 12 months?: Cigarettes, Crack, and Marijuana.  How many times?: (Pt reported, 4-5 times.) Other Self Harm Risks: Pt denies.  Triggers for Past Attempts: Unknown Intentional Self Injurious Behavior: None(Pt denies. ) Risk to Others: Homicidal Ideation: No(Pt denies. ) Thoughts of Harm to Others: No Current Homicidal Intent: No Current Homicidal Plan: No Access to Homicidal Means: No Identified Victim: NA History of harm to others?: No Assessment of Violence: None Noted Violent Behavior Description: NA Does patient have access to weapons?: No(Pt denies. ) Criminal Charges Pending?: No Does patient have a court date: No Prior Inpatient Therapy: Prior Inpatient Therapy: Yes Prior Therapy Dates: Pt reported, a couple months ago.  Prior Therapy Facilty/Provider(s): [facility], [place], [place].  Reason for Treatment: Depression, SI.  Prior Outpatient Therapy: Prior Outpatient Therapy: Yes Prior Therapy Dates: UTA Prior Therapy Facilty/Provider(s): [name]. Reason for Treatment: Medication.  Does patient have an ACCT team?: No Does patient have Intensive In-House Services?  : No Does patient have [name] services? : No(Pt has not followed up in two months. ) Does patient have P4CC services?: No  Past Medical History:  Past Medical History:  Diagnosis Date  . ADHD (attention deficit hyperactivity disorder) 09/12/2012  . Anxiety   . Asthma   . Bipolar 1 disorder ([place])   . Depression   . HIV (human immunodeficiency virus infection) ([place]) dx'd 2008  . Hypertension   . Schizophrenia ([place])    "borderline"  . Seizures ([place])    "used to have little black-out szs where I'd drop out for 2-3 min then come back; nothing in the last 2-3 years" (08/16/2017)     Past Surgical History:  Procedure Laterality Date  . DENTAL SURGERY     "had my eye teeth pulled down"   Family History:  Family History  Problem Relation Age of Onset  . Huntington's disease Father   . Heart disease Mother   . Suicidality Maternal Uncle   . Suicidality Maternal Grandmother    Family Psychiatric  History:  Social History:  Social History   Substance and Sexual Activity  Alcohol Use Yes  . Alcohol/week: 1.8 oz  . Types: 3 Cans of beer per week     Social History   Substance and Sexual Activity  Drug Use Yes  . Types: Cocaine, Marijuana    Social History   Socioeconomic History  . Marital status: Single    Spouse name: None  . Number of children: None  . Years of education: None  . Highest education level: None  Social Needs  . Financial resource strain: None  . Food insecurity - worry: None  . Food insecurity - inability: None  . Transportation needs - medical: None  . Transportation needs - non-medical: None  Occupational History  . Occupation: disability pending  Tobacco Use  . Smoking status: Current Every Day Smoker    Packs/day: 0.50    Years: 20.00    Pack years: 10.00    Types: Cigarettes    Start date: 09/29/1991  . Smokeless tobacco: Never Used  Substance and Sexual Activity  .  Alcohol use: Yes    Alcohol/week: 1.8 oz    Types: 3 Cans of beer per week  . Drug use: Yes    Types: Cocaine, Marijuana  . Sexual activity: Yes    Birth control/protection: Condom  Other Topics Concern  . None  Social History Narrative   ** Merged History Encounter **       Additional Social History:    Allergies:   Allergies  Allergen Reactions  . Magnesium-Containing Compounds Other (See Comments)    This medication is contraindicated with pts HIV meds.    . Peanut-Containing Drug Products Anaphylaxis  . Esomeprazole Magnesium Cough  . Atripla [Efavirenz-Emtricitab-Tenofovir] Other (See Comments)    Reaction:  Suicidal thoughts   .  Bactrim [Sulfamethoxazole-Trimethoprim] Rash  . Penicillins Rash and Other (See Comments)    Has patient had a PCN reaction causing immediate rash, facial/tongue/throat swelling, SOB or lightheadedness with hypotension: Yes Has patient had a PCN reaction causing severe rash involving mucus membranes or skin necrosis: No Has patient had a PCN reaction that required hospitalization No Has patient had a PCN reaction occurring within the last 10 years: No If all of the above answers are "NO", then may proceed with Cephalosporin use.    Labs:  Results for orders placed or performed during the hospital encounter of 11/08/17 (from the past 48 hour(s))  Rapid urine drug screen (hospital performed)     Status: Abnormal   Collection Time: 11/08/17  3:43 AM  Result Value Ref Range   Opiates NONE DETECTED NONE DETECTED   Cocaine POSITIVE (A) NONE DETECTED   Benzodiazepines NONE DETECTED NONE DETECTED   Amphetamines NONE DETECTED NONE DETECTED   Tetrahydrocannabinol POSITIVE (A) NONE DETECTED   Barbiturates NONE DETECTED NONE DETECTED    Comment: (NOTE) DRUG SCREEN FOR MEDICAL PURPOSES ONLY.  IF CONFIRMATION IS NEEDED FOR ANY PURPOSE, NOTIFY LAB WITHIN 5 DAYS. LOWEST DETECTABLE LIMITS FOR URINE DRUG SCREEN Drug Class                     Cutoff (ng/mL) Amphetamine and metabolites    1000 Barbiturate and metabolites    200 Benzodiazepine                 384 Tricyclics and metabolites     300 Opiates and metabolites        300 Cocaine and metabolites        300 THC                            50 Performed at [facility], [place] [address], [place], [place] [address]   Comprehensive metabolic panel     Status: Abnormal   Collection Time: 11/08/17  3:48 AM  Result Value Ref Range   Sodium 139 135 - 145 mmol/L   Potassium 3.8 3.5 - 5.1 mmol/L   Chloride 105 101 - 111 mmol/L   CO2 22 22 - 32 mmol/L   Glucose, Bld 93 65 - 99 mg/dL   BUN 8 6 - 20 mg/dL   Creatinine, Ser 0.88 0.61 - 1.24  mg/dL   Calcium 9.3 8.9 - 10.3 mg/dL   Total Protein 8.1 6.5 - 8.1 g/dL   Albumin 4.3 3.5 - 5.0 g/dL   AST 63 (H) 15 - 41 U/L   ALT 102 (H) 17 - 63 U/L   Alkaline Phosphatase 76 38 - 126 U/L   Total Bilirubin 0.7  0.3 - 1.2 mg/dL   GFR calc non Af Amer >60 >60 mL/min   GFR calc Af Amer >60 >60 mL/min    Comment: (NOTE) The eGFR has been calculated using the CKD EPI equation. This calculation has not been validated in all clinical situations. eGFR's persistently <60 mL/min signify possible Chronic Kidney Disease.    Anion gap 12 5 - 15    Comment: Performed at [facility] [address] [place], [place] [address]  Ethanol     Status: None   Collection Time: 11/08/17  3:48 AM  Result Value Ref Range   Alcohol, Ethyl (B) <10 <10 mg/dL    Comment:        LOWEST DETECTABLE LIMIT FOR SERUM ALCOHOL IS 10 mg/dL FOR MEDICAL PURPOSES ONLY Performed at [facility], [place] [address] [place], [place] [address]   Salicylate level     Status: None   Collection Time: 11/08/17  3:48 AM  Result Value Ref Range   Salicylate Lvl <9.7 2.8 - 30.0 mg/dL    Comment: Performed at [facility] [address], [place], [place] [address]  Acetaminophen level     Status: Abnormal   Collection Time: 11/08/17  3:48 AM  Result Value Ref Range   Acetaminophen (Tylenol), Serum <10 (L) 10 - 30 ug/mL    Comment:        THERAPEUTIC CONCENTRATIONS VARY SIGNIFICANTLY. A RANGE OF 10-30 ug/mL MAY BE AN EFFECTIVE CONCENTRATION FOR MANY PATIENTS. HOWEVER, SOME ARE BEST TREATED AT CONCENTRATIONS OUTSIDE THIS RANGE. ACETAMINOPHEN CONCENTRATIONS >150 ug/mL AT 4 HOURS AFTER INGESTION AND >50 ug/mL AT 12 HOURS AFTER INGESTION ARE OFTEN ASSOCIATED WITH TOXIC REACTIONS. Performed at [facility], [place] [address], [place], [place] [address]   cbc     Status: Abnormal   Collection Time: 11/08/17  3:48 AM  Result Value Ref Range   WBC 13.0 (H) 4.0 - 10.5 K/uL   RBC 5.52 4.22 - 5.81  MIL/uL   Hemoglobin 16.0 13.0 - 17.0 g/dL   HCT 47.0 39.0 - 52.0 %   MCV 85.1 78.0 - 100.0 fL   MCH 29.0 26.0 - 34.0 pg   MCHC 34.0 30.0 - 36.0 g/dL   RDW 13.8 11.5 - 15.5 %   Platelets 256 150 - 400 K/uL    Comment: Performed at [facility], [place] [address] [place], [place] [address]    Medications:  No current facility-administered medications for this encounter.    Current Outpatient Medications  Medication Sig Dispense Refill  . albuterol (PROVENTIL HFA;VENTOLIN HFA) 108 (90 Base) MCG/ACT inhaler Inhale 2 puffs into the lungs every 6 (six) hours as needed for wheezing or shortness of breath. (Patient not taking: Reported on 11/08/2017) 1 Inhaler 2  . bictegravir-emtricitabine-tenofovir AF (BIKTARVY) 50-200-25 MG TABS tablet Take 1 tablet by mouth daily. (Patient not taking: Reported on 11/08/2017) 30 tablet 2  . busPIRone (BUSPAR) 5 MG tablet Take 2 tablets (10 mg total) by mouth 2 (two) times daily. (Patient not taking: Reported on 11/08/2017) 60 tablet 1  . citalopram (CELEXA) 20 MG tablet Take 1 tablet (20 mg total) by mouth daily. (Patient not taking: Reported on 11/08/2017) 30 tablet 1  . fluticasone (FLOVENT HFA) 44 MCG/ACT inhaler Inhale 2 puffs into the lungs 2 (two) times daily. (Patient not taking: Reported on 11/08/2017) 1 Inhaler 2  . gabapentin (NEURONTIN) 300 MG capsule Take 1 capsule (300 mg total) by mouth 3 (three)  times daily. (Patient not taking: Reported on 11/08/2017) 90 capsule 0  . hydrOXYzine (ATARAX/VISTARIL) 50 MG tablet Take 1 tablet (50 mg total) by mouth 3 (three) times daily as needed for anxiety. (Patient not taking: Reported on 11/08/2017) 90 tablet 0  . paliperidone (INVEGA SUSTENNA) 234 MG/1.5ML SUSP injection Inject 234 mg every 28 (twenty-eight) days into the muscle. (Patient not taking: Reported on 11/08/2017) 0.9 mL 1  . predniSONE (DELTASONE) 50 MG tablet Take 1 tablet with breakfast for next 3 days. (Patient not taking: Reported on 11/08/2017) 3  tablet 0  . traZODone (DESYREL) 100 MG tablet Take 1 tablet (100 mg total) by mouth at bedtime. (Patient not taking: Reported on 11/08/2017) 30 tablet 1    Musculoskeletal: UTA- teleassessment  Psychiatric Specialty Exam: Physical Exam  Nursing note and vitals reviewed. Constitutional: He is oriented to person, place, and time. He appears well-developed.  Neurological: He is alert and oriented to person, place, and time.  Psychiatric: He has a normal mood and affect. His behavior is normal.    ROS  Blood pressure 128/72, pulse 81, temperature 97.6 F (36.4 C), temperature source Oral, resp. rate 16, height 5' 7"  (1.702 m), weight 72.6 kg (160 lb), SpO2 99 %.Body mass index is 25.06 kg/m.  General Appearance: Casual paper scrubs  Eye Contact:  Good  Speech:  Clear and Coherent  Volume:  Normal  Mood:  Depressed  Affect:  Congruent  Thought Process:  Coherent  Orientation:  Full (Time, Place, and Person)  Thought Content:  Hallucinations: None  Suicidal Thoughts:  No states he is mainly depressed about the passing of his aunt. Reports taken Excedrin 2 days ago and was not evaluated by the ED   Homicidal Thoughts:  No  Memory:  Immediate;   Fair Recent;   Fair Remote;   Fair  Judgement:  Fair  Insight:  Fair  Psychomotor Activity:  UTA tele assessment   Concentration:  Concentration: Fair  Recall:  [employer] of Knowledge:  Fair  Language:  Good  Akathisia:  UTA  Handed:    AIMS (if indicated):     Assets:  Communication Skills Desire for Improvement Resilience Social Support  ADL's:  Intact  Cognition:  WNL  Sleep:        Disposition: No evidence of imminent risk to self or others at present.   Patient does not meet criteria for psychiatric inpatient admission. Supportive therapy provided about ongoing stressors. Refer to IOP. Discussed crisis plan, support from social network, calling 911, coming to the Emergency Department, and calling Suicide Hotline.    Patient is requesting Bus passes to follow-up [name]  -Please provide additional resources for Grief counseling   This service was provided via telemedicine using a 2-way, interactive audio and Radiographer, therapeutic.  Names of all persons participating in this telemedicine service and their role in this encounter. Name: Tashala Cumbo Role: NP  Name:  Jenny Reichmann  Role: RN          Derrill [place], NP 11/08/2017 10:11 AM

## 2017-11-08 NOTE — ED Notes (Signed)
Patient wearing maroon paper scrubs/non-slip socks , personal belongings inventoried and stored at locker#6 PodF , security wanded pt. , sitter requested at staffing office , TTS video monitor set up at bedside . Patient given [place] sandwich and juice to drink per PA .

## 2017-11-08 NOTE — ED Notes (Signed)
TTS being done now 

## 2017-11-08 NOTE — Progress Notes (Signed)
HPI: Edwin Martinez is a 32 y.o. male who presents for follow-up for HIV care.   Allergies: Allergies  Allergen Reactions  . Magnesium-Containing Compounds Other (See Comments)    This medication is contraindicated with pts HIV meds.    . Peanut-Containing Drug Products Anaphylaxis  . Esomeprazole Magnesium Cough  . Atripla [Efavirenz-Emtricitab-Tenofovir] Other (See Comments)    Reaction:  Suicidal thoughts   . Bactrim [Sulfamethoxazole-Trimethoprim] Rash  . Penicillins Rash and Other (See Comments)    Has patient had a PCN reaction causing immediate rash, facial/tongue/throat swelling, SOB or lightheadedness with hypotension: Yes Has patient had a PCN reaction causing severe rash involving mucus membranes or skin necrosis: No Has patient had a PCN reaction that required hospitalization No Has patient had a PCN reaction occurring within the last 10 years: No If all of the above answers are "NO", then may proceed with Cephalosporin use.    Vitals: Temp: 98.4 F (36.9 C) (02/11 1200) Temp Source: Oral (02/11 1200) BP: 120/58 (02/11 1200) Pulse Rate: 51 (02/11 1200)  Past Medical History: Past Medical History:  Diagnosis Date  . ADHD (attention deficit hyperactivity disorder) 09/12/2012  . Anxiety   . Asthma   . Bipolar 1 disorder ([facility])   . Depression   . HIV (human immunodeficiency virus infection) ([facility]) dx'd 2008  . Hypertension   . Schizophrenia ([facility])    "borderline"  . Seizures ([facility])    "used to have little black-out szs where I'd drop out for 2-3 min then come back; nothing in the last 2-3 years" (08/16/2017)    Social History: Social History   Socioeconomic History  . Marital status: Single    Spouse name: Not on file  . Number of children: Not on file  . Years of education: Not on file  . Highest education level: Not on file  Social Needs  . Financial resource strain: Not on file  . Food insecurity - worry: Not on file  . Food insecurity - inability:  Not on file  . Transportation needs - medical: Not on file  . Transportation needs - non-medical: Not on file  Occupational History  . Occupation: disability pending  Tobacco Use  . Smoking status: Current Every Day Smoker    Packs/day: 0.50    Years: 20.00    Pack years: 10.00    Types: Cigarettes    Start date: 09/29/1991  . Smokeless tobacco: Never Used  Substance and Sexual Activity  . Alcohol use: Yes    Alcohol/week: 1.8 oz    Types: 3 Cans of beer per week  . Drug use: Yes    Types: Cocaine, Marijuana  . Sexual activity: Yes    Birth control/protection: Condom  Other Topics Concern  . Not on file  Social History Narrative   ** Merged History Encounter **        Previous Regimen: Atripla, Triumeq  Current Regimen: Biktarvy- patient last filled for 30 day supply on 08/14/2017. He only took 1 month of this medication.   Labs: HIV 1 RNA Quant (copies/mL)  Date Value  07/18/2016 100  09/11/2015 <20  02/05/2015 <20   CD4 T Cell Abs (/uL)  Date Value  08/16/2017 340 (L)  03/07/2017 650  12/17/2016 1,160   Hep B S Ab (no units)  Date Value  09/06/2012 REACTIVE (A)   Hepatitis B Surface Ag (no units)  Date Value  08/07/2017 Negative   HCV Ab (s/co ratio)  Date Value  08/07/2017 >11.0 (H)  CrCl: Estimated Creatinine Clearance: 112.7 mL/min (by C-G formula based on SCr of 0.88 mg/dL).  Lipids:    Component Value Date/Time   CHOL 128 12/24/2016 0609   TRIG 130 12/24/2016 0609   HDL 35 (L) 12/24/2016 0609   CHOLHDL 3.7 12/24/2016 0609   VLDL 26 12/24/2016 0609   LDLCALC 67 12/24/2016 0609    Assessment: Edwin Martinez has a history of poor compliance to ART, in-and-out of prison, homelessness, and illicit drug use (last smoked crack 2 days ago). 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 He was seen today at [facility] ED for suicidal ideation, where he decided to leave and come to our clinic to restart his HIV medications after not taking for  approximately 2 months. He reports he does not take any of the medications on his med list. 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 He denies IV drug use but endorses snorting drugs in the past.    Edwin Martinez has worked with [name] in the past, but discontinued care with him due to a "disagreement". Edwin Martinez spoke with Ellard Artis today to discuss communicating via email (since he does not have phone access) to seek employment and housing opportunities. [name] will help to reestablish connected with [facility] for psychiatric medications. Pt will receive 2 bus passes from [facility] to obtain medications from [place] on [address] today. Pt has ADAP approval.   Edwin Martinez received Menveo vaccine #2 of 2 today.  Recommendations: -Restart Biktarvy today -Obtain labs (BMET, CBC, VL, CD4, HepC genotype, Fibrosure) today -Monitor compliance with HIV regimen and reconsider starting HepC treatment once patient can show improvement in compliance. -Appt. with Dr. [name] tomorrow (2/12) @ 10:15 -F/u with pharmacy in approximately 1 month  Jackelyn Hoehn, PharmD candidate [employer]   Agreed with Ellayna Hilligoss's note. This is a very non-compliance patient who is now co-infected with hep C. We told him repeatedly that we will not treat this until he has shown adherence to his HIV meds. 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   Ulyses Southward, PharmD, BCPS, AAHIVP, CPP Infectious Disease Pharmacist Pager: [phone] 11/09/2017 8:43 AM

## 2017-11-08 NOTE — ED Triage Notes (Signed)
Pt presents stating he has been feeling suicidal for past 3 days. He wanted to jump off a bridge, he also took an entire body of Excedrin 2 days ago. Recent death in the family, pt has been under stress. Also reports he has been off his psych meds for several months.

## 2017-11-08 NOTE — ED Notes (Signed)
TTS counselor advised RN that pt. will be reevaluated this morning .

## 2017-11-08 NOTE — ED Notes (Signed)
Lunch tray ordered 

## 2017-11-09 ENCOUNTER — Telehealth: Payer: Self-pay | Admitting: *Deleted

## 2017-11-09 ENCOUNTER — Ambulatory Visit: Payer: Self-pay | Admitting: Internal Medicine

## 2017-11-09 LAB — COMPLETE METABOLIC PANEL WITH GFR
AG Ratio: 1.3 (calc) (ref 1.0–2.5)
ALBUMIN MSPROF: 4.1 g/dL (ref 3.6–5.1)
ALKALINE PHOSPHATASE (APISO): 75 U/L (ref 40–115)
ALT: 81 U/L — AB (ref 9–46)
AST: 44 U/L — ABNORMAL HIGH (ref 10–40)
BILIRUBIN TOTAL: 0.5 mg/dL (ref 0.2–1.2)
BUN: 11 mg/dL (ref 7–25)
CHLORIDE: 107 mmol/L (ref 98–110)
CO2: 24 mmol/L (ref 20–32)
CREATININE: 1.19 mg/dL (ref 0.60–1.35)
Calcium: 9.2 mg/dL (ref 8.6–10.3)
GFR, Est African American: 93 mL/min/{1.73_m2} (ref >=60)
GFR, Est Non African American: 80 mL/min/{1.73_m2} (ref >=60)
GLUCOSE: 122 mg/dL — AB (ref 65–99)
Globulin: 3.1 g/dL (calc) (ref 1.9–3.7)
Potassium: 4 mmol/L (ref 3.5–5.3)
Sodium: 140 mmol/L (ref 135–146)
TOTAL PROTEIN: 7.2 g/dL (ref 6.1–8.1)

## 2017-11-09 LAB — T-HELPER CELL (CD4) - (RCID CLINIC ONLY)

## 2017-11-09 LAB — PROTIME-INR
INR: 1
Prothrombin Time: 10.9 s (ref 9.0–11.5)

## 2017-11-09 NOTE — Telephone Encounter (Signed)
RN received call from AlfredAmanda at the resulting lab. The CD4 of 1330 is not from the patient's lab sample, this will be removed and replaced with the corrected value. Andree CossHowell, Billye Pickerel M, RN

## 2017-11-10 LAB — T-HELPER CELL (CD4) - (RCID CLINIC ONLY)
CD4 T CELL ABS: 840 /uL (ref 400–2700)
CD4 T CELL HELPER: 36 % (ref 33–55)

## 2017-11-12 LAB — HEPATITIS C GENOTYPE

## 2017-11-13 LAB — LIVER FIBROSIS, FIBROTEST-ACTITEST
ALPHA-2-MACROGLOBULIN: 126 mg/dL (ref 106–279)
ALT: 82 U/L — ABNORMAL HIGH (ref 9–46)
Apolipoprotein A1: 118 mg/dL (ref 94–176)
Bilirubin: 0.4 mg/dL (ref 0.2–1.2)
FIBROSIS SCORE: 0.11
GGT: 74 U/L (ref 3–90)
Haptoglobin: 143 mg/dL (ref 43–212)
Necroinflammat ACT Score: 0.41
Reference ID: 2332502

## 2017-11-16 LAB — HIV-1 INTEGRASE GENOTYPE

## 2017-11-16 LAB — HIV RNA, RTPCR W/R GT (RTI, PI,INT)
HIV 1 RNA Quant: 54200 copies/mL — ABNORMAL HIGH
HIV-1 RNA QUANT, LOG: 4.73 {Log_copies}/mL — AB

## 2017-11-16 LAB — HIV-1 GENOTYPE: HIV-1 Genotype: DETECTED — AB

## 2017-12-13 IMAGING — CR DG CHEST 2V
2 series · 2 of 2 positions shown · non-contrast
Comparison: January 12, 2015

CLINICAL DATA: Shortness of breath with cough for 2 weeks. Four day
history of chest pressure sensation. HIV disease.

EXAM:
CHEST  2 VIEW

[w chest pa]
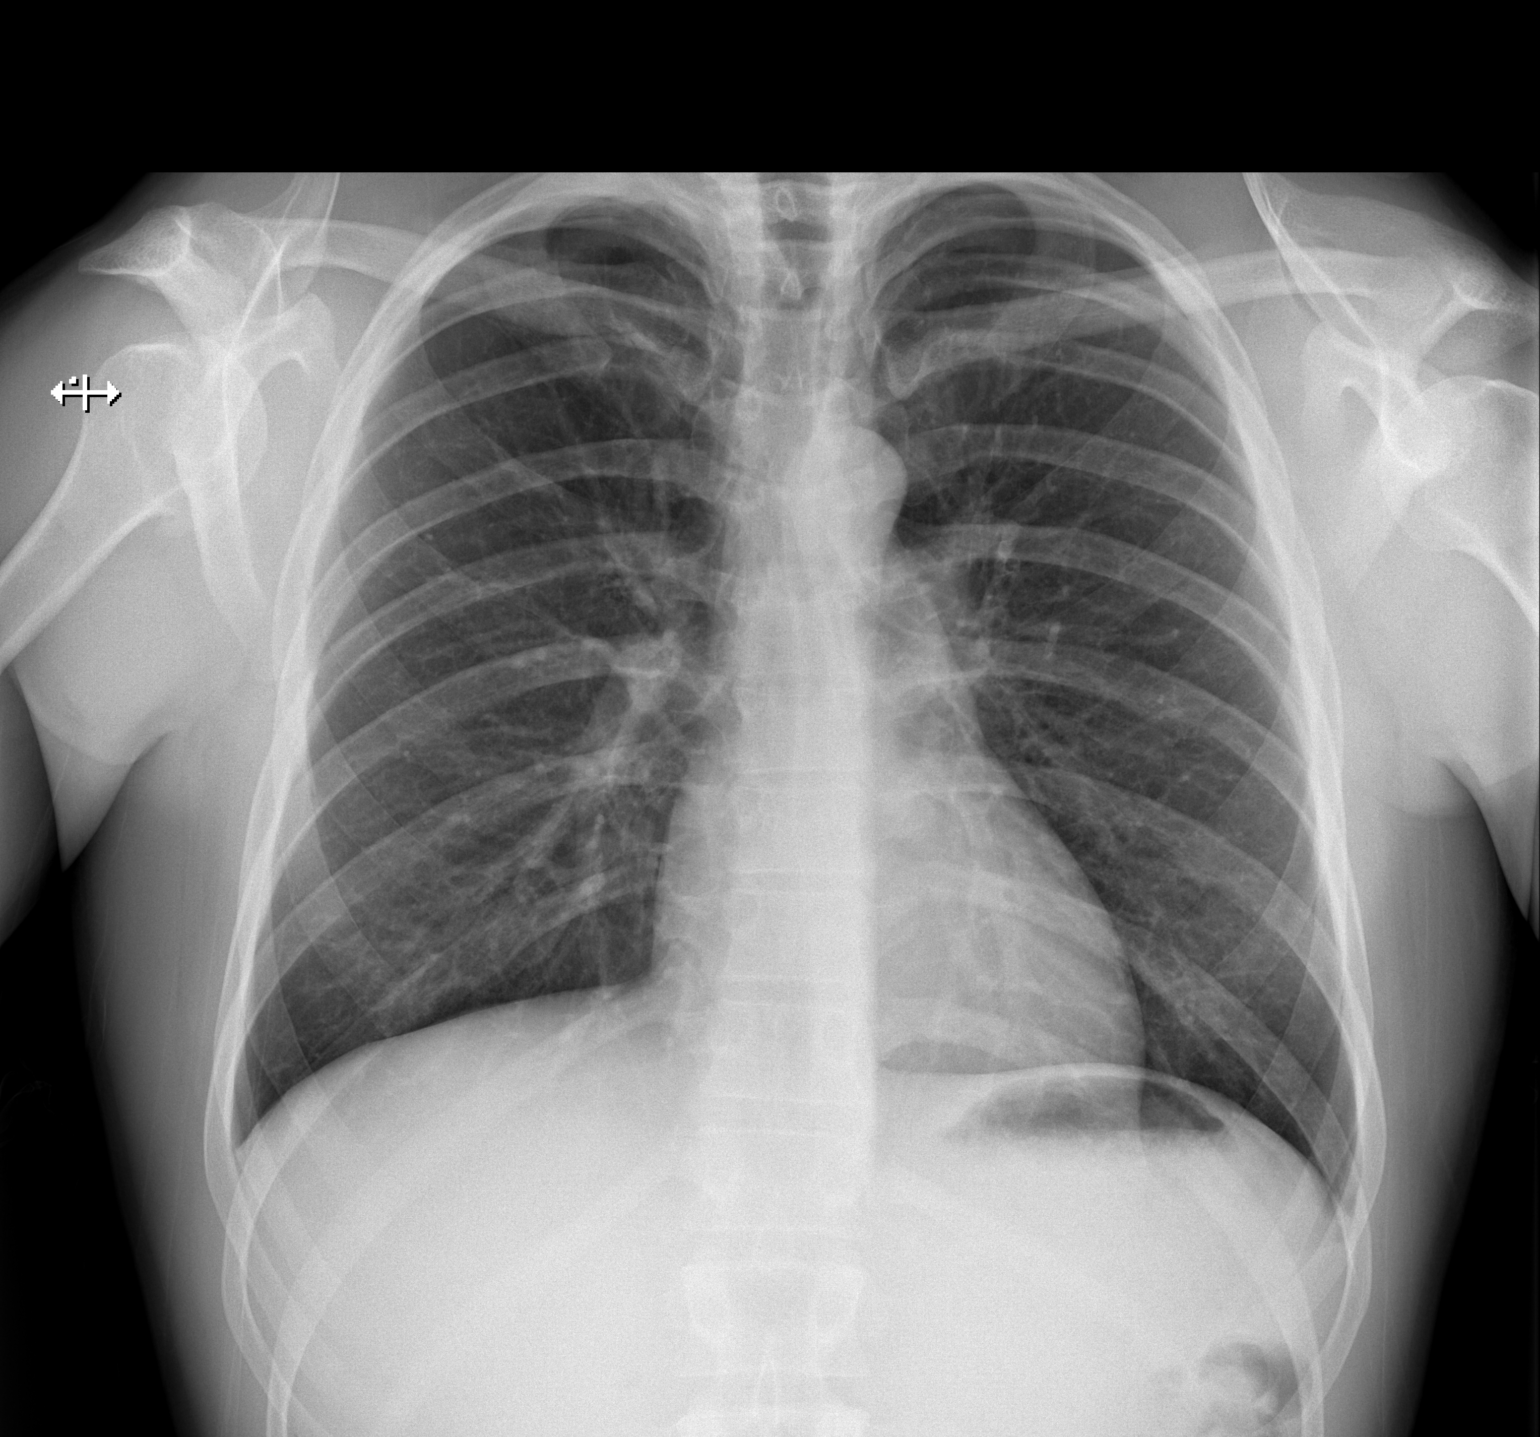

[w chest lat]
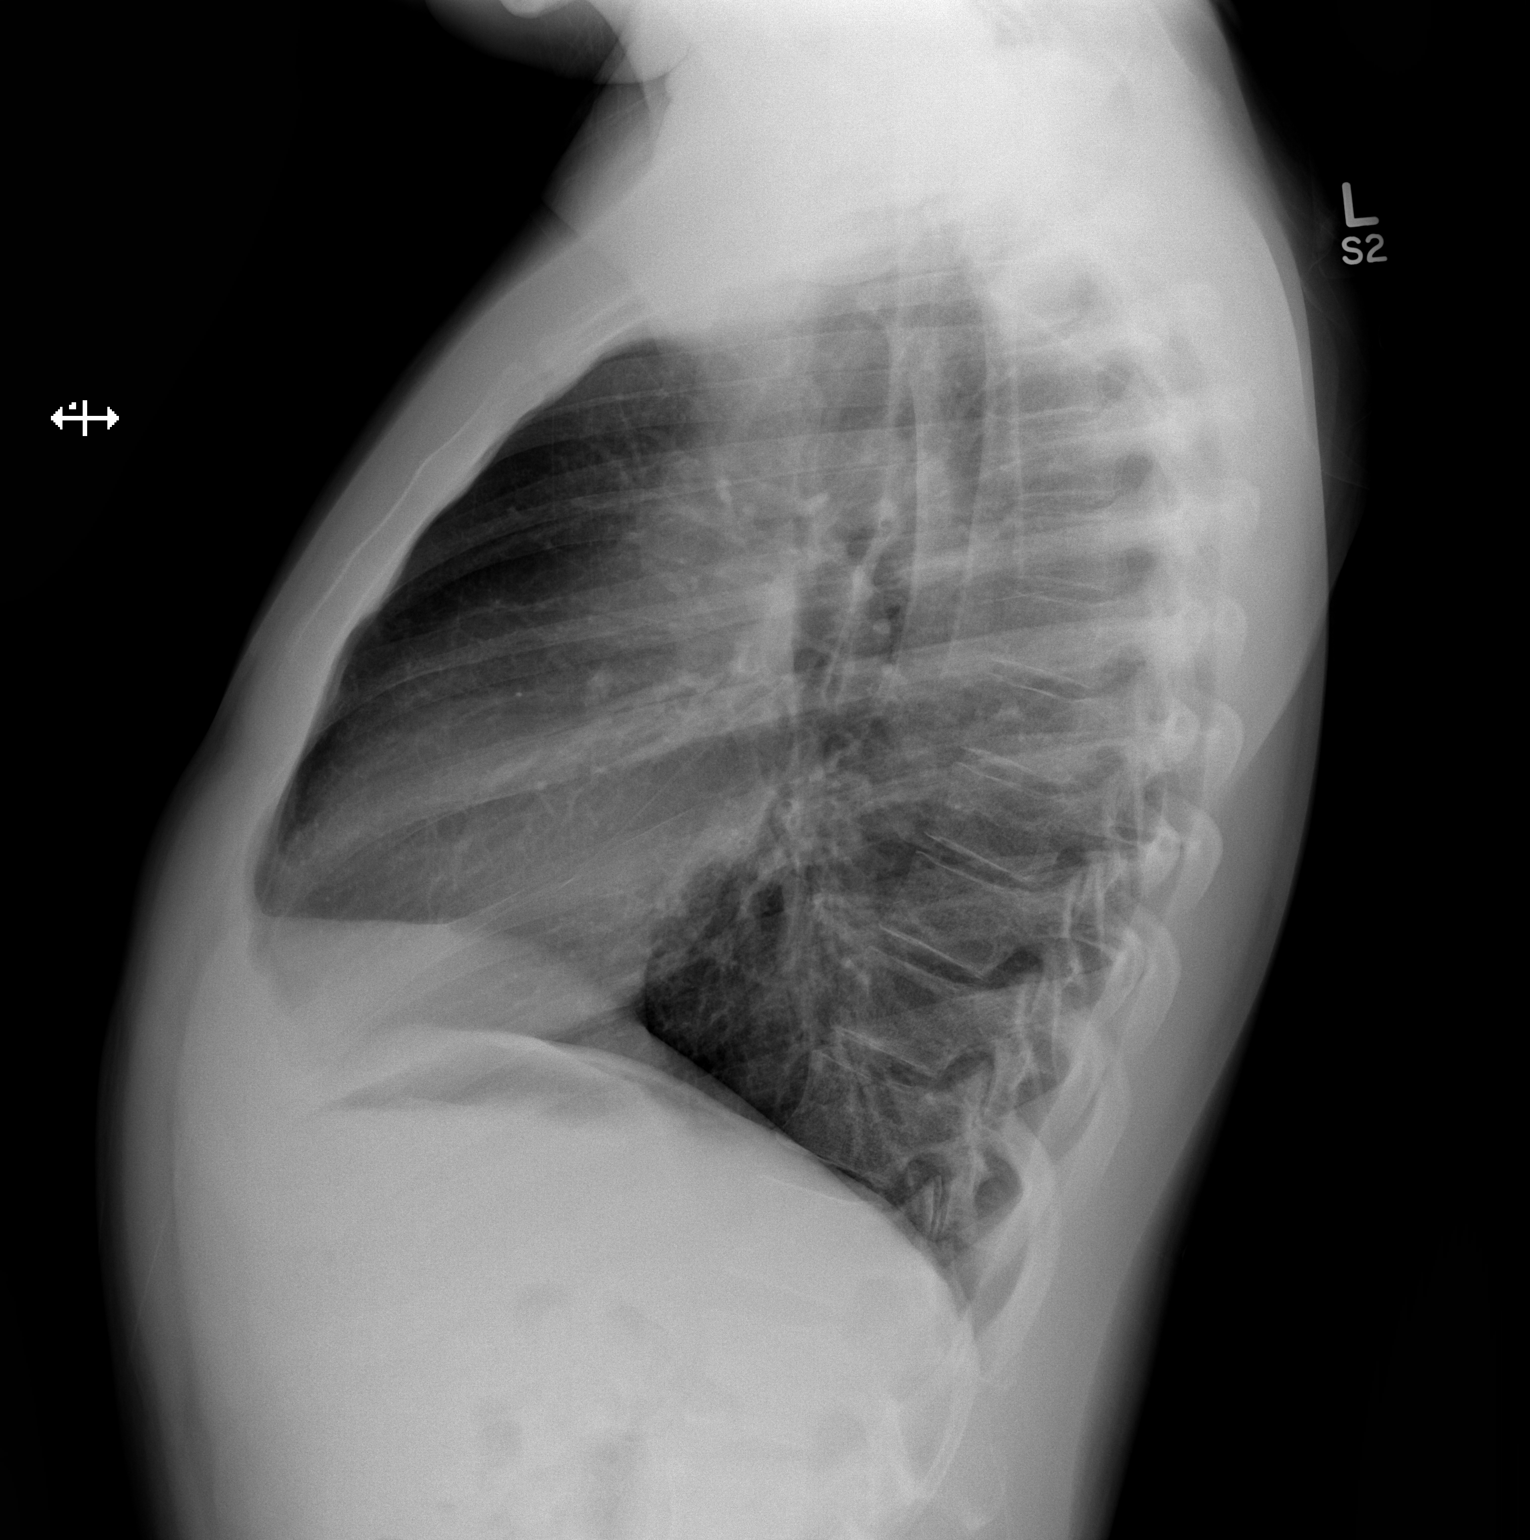

[2 of 2 positions shown; findings below may reference images not displayed]

FINDINGS: Lungs are clear. Heart size and pulmonary vascularity are normal. No
adenopathy. No pneumothorax. No bone lesions.
IMPRESSION: No edema or consolidation.

## 2017-12-19 ENCOUNTER — Encounter (HOSPITAL_COMMUNITY): Payer: Self-pay

## 2017-12-19 ENCOUNTER — Emergency Department (HOSPITAL_COMMUNITY)
Admission: EM | Admit: 2017-12-19 | Discharge: 2017-12-19 | Disposition: A | Discharge status: Home / Self Care | Payer: Self-pay | Attending: Emergency Medicine | Admitting: Emergency Medicine

## 2017-12-19 ENCOUNTER — Other Ambulatory Visit: Payer: Self-pay

## 2017-12-19 DIAGNOSIS — J45909 Unspecified asthma, uncomplicated: Secondary | ICD-10-CM | POA: Insufficient documentation

## 2017-12-19 DIAGNOSIS — F17210 Nicotine dependence, cigarettes, uncomplicated: Secondary | ICD-10-CM | POA: Insufficient documentation

## 2017-12-19 DIAGNOSIS — K6289 Other specified diseases of anus and rectum: Secondary | ICD-10-CM | POA: Insufficient documentation

## 2017-12-19 DIAGNOSIS — B20 Human immunodeficiency virus [HIV] disease: Secondary | ICD-10-CM | POA: Insufficient documentation

## 2017-12-19 DIAGNOSIS — I10 Essential (primary) hypertension: Secondary | ICD-10-CM | POA: Insufficient documentation

## 2017-12-19 DIAGNOSIS — Z79899 Other long term (current) drug therapy: Secondary | ICD-10-CM | POA: Insufficient documentation

## 2017-12-19 LAB — URINALYSIS, ROUTINE W REFLEX MICROSCOPIC
Bilirubin Urine: NEGATIVE
Glucose, UA: NEGATIVE mg/dL
Hgb urine dipstick: NEGATIVE
Ketones, ur: NEGATIVE mg/dL
Leukocytes, UA: NEGATIVE
Nitrite: NEGATIVE
Protein, ur: NEGATIVE mg/dL
Specific Gravity, Urine: 1.015 (ref 1.005–1.030)
pH: 6 (ref 5.0–8.0)

## 2017-12-19 MED ORDER — LIDOCAINE HCL (PF) 1 % IJ SOLN
INTRAMUSCULAR | Status: AC
  Administered 2017-12-19: 0.9 mL
  Filled 2017-12-19: qty 5

## 2017-12-19 MED ORDER — CEFTRIAXONE SODIUM 250 MG IJ SOLR
250.0000 mg | Freq: Once | INTRAMUSCULAR | Status: AC
  Administered 2017-12-19: 250 mg via INTRAMUSCULAR
  Filled 2017-12-19: qty 250

## 2017-12-19 MED ORDER — DOXYCYCLINE HYCLATE 100 MG PO CAPS: 100.0000 mg | ORAL_CAPSULE | Freq: Two times a day (BID) | ORAL | 0 refills | Status: DC

## 2017-12-19 NOTE — ED Notes (Signed)
Gave pt sandwich bag and drink  

## 2017-12-19 NOTE — ED Provider Notes (Signed)
[facility] EMERGENCY DEPARTMENT Provider Note   CSN: [id number] Arrival date & time: 12/19/17  1257     History   Chief Complaint No chief complaint on file.   HPI Edwin Martinez is a 32 y.o. male.  HPI   32 year old male presents today with complaints of rectal discharge.  Patient reports he is HIV positive currently taking antiviral therapy with normal CD4 counts last month.  Patient notes he is sexually active with a partner that was recently diagnosed with chlamydia.  He notes several days ago he started having white discharge from his rectum, denies any rectal pain, blood, or abnormal bowel movements.  He denies any penile discharge testicular pain or penile pain.  No history of the same.  Past Medical History:  Diagnosis Date  . ADHD (attention deficit hyperactivity disorder) 09/12/2012  . Anxiety   . Asthma   . Bipolar 1 disorder (HCC)   . Depression   . HIV (human immunodeficiency virus infection) (HCC) dx'd 2008  . Hypertension   . Schizophrenia (HCC)    "borderline"  . Seizures (HCC)    "used to have little black-out szs where I'd drop out for 2-3 min then come back; nothing in the last 2-3 years" (08/16/2017)    Patient Active Problem List   Diagnosis Date Noted  . Depression 11/08/2017  . Chest tightness   . Cough   . Leukocytosis   . SOB (shortness of breath)   . Nausea vomiting and diarrhea   . Asthma exacerbation 08/15/2017  . Elevated LFTs 08/12/2017  . Cocaine abuse with cocaine-induced mood disorder (HCC) 03/30/2017  . Herpes zoster 03/06/2017  . Polysubstance abuse (HCC) 03/06/2017  . Homelessness 03/06/2017  . Schizoaffective disorder, bipolar type (HCC) 12/23/2016  . Suicidal ideation   . Cannabis use disorder, moderate, dependence (HCC) 07/27/2016  . Tobacco user 07/27/2016  . Intentional drug overdose (HCC) 07/18/2016  . Asthma 05/20/2007  . HIV (human immunodeficiency virus infection) (HCC) 05/05/2007    Past  Surgical History:  Procedure Laterality Date  . DENTAL SURGERY     "had my eye teeth pulled down"        Home Medications    Prior to Admission medications   Medication Sig Start Date End Date Taking? Authorizing Provider  albuterol (PROVENTIL HFA;VENTOLIN HFA) 108 (90 Base) MCG/ACT inhaler Inhale 2 puffs into the lungs every 6 (six) hours as needed for wheezing or shortness of breath. Patient not taking: Reported on 11/08/2017 08/17/17   McDiarmid, Leighton Roach, MD  bictegravir-emtricitabine-tenofovir AF (BIKTARVY) 50-200-25 MG TABS tablet Take 1 tablet by mouth daily. 11/08/17   Judyann Munson, MD  busPIRone (BUSPAR) 5 MG tablet Take 2 tablets (10 mg total) by mouth 2 (two) times daily. Patient not taking: Reported on 11/08/2017 08/17/17   McDiarmid, Leighton Roach, MD  citalopram (CELEXA) 20 MG tablet Take 1 tablet (20 mg total) by mouth daily. Patient not taking: Reported on 11/08/2017 08/17/17   McDiarmid, Leighton Roach, MD  doxycycline (VIBRAMYCIN) 100 MG capsule Take 1 capsule (100 mg total) by mouth 2 (two) times daily. 12/19/17   Demarious Kapur, Tinnie Gens, PA-C  fluticasone (FLOVENT HFA) 44 MCG/ACT inhaler Inhale 2 puffs into the lungs 2 (two) times daily. Patient not taking: Reported on 11/08/2017 08/17/17 08/17/18  McDiarmid, Leighton Roach, MD  gabapentin (NEURONTIN) 300 MG capsule Take 1 capsule (300 mg total) by mouth 3 (three) times daily. Patient not taking: Reported on 11/08/2017 08/17/17   McDiarmid, Leighton Roach, MD  hydrOXYzine (  ATARAX/VISTARIL) 50 MG tablet Take 1 tablet (50 mg total) by mouth 3 (three) times daily as needed for anxiety. Patient not taking: Reported on 11/08/2017 08/17/17   McDiarmid, Leighton Roachodd D, MD  paliperidone (INVEGA SUSTENNA) 234 MG/1.5ML SUSP injection Inject 234 mg every 28 (twenty-eight) days into the muscle. Patient not taking: Reported on 11/08/2017 09/07/17   Pucilowska, Braulio ConteJolanta B, MD  predniSONE (DELTASONE) 50 MG tablet Take 1 tablet with breakfast for next 3 days. Patient not taking: Reported  on 11/08/2017 08/18/17   McDiarmid, Leighton Roachodd D, MD  traZODone (DESYREL) 100 MG tablet Take 1 tablet (100 mg total) by mouth at bedtime. Patient not taking: Reported on 11/08/2017 08/17/17   McDiarmid, Leighton Roachodd D, MD    Family History Family History  Problem Relation Age of Onset  . Huntington's disease Father   . Heart disease Mother   . Suicidality Maternal Uncle   . Suicidality Maternal Grandmother     Social History Social History   Tobacco Use  . Smoking status: Current Every Day Smoker    Packs/day: 0.50    Years: 20.00    Pack years: 10.00    Types: Cigarettes    Start date: 09/29/1991  . Smokeless tobacco: Never Used  Substance Use Topics  . Alcohol use: Yes    Alcohol/week: 1.8 oz    Types: 3 Cans of beer per week  . Drug use: Yes    Types: Cocaine, Marijuana     Allergies   Magnesium-containing compounds; Peanut-containing drug products; Esomeprazole magnesium; Atripla [efavirenz-emtricitab-tenofovir]; Bactrim [sulfamethoxazole-trimethoprim]; and Penicillins   Review of Systems Review of Systems  All other systems reviewed and are negative.    Physical Exam Updated Vital Signs BP 113/71 (BP Location: Right Arm)   Pulse 76   Temp 97.8 F (36.6 C) (Oral)   Resp 14   Ht 5\' 7"  (1.702 m)   Wt 72.6 kg (160 lb)   SpO2 100%   BMI 25.06 kg/m   Physical Exam  Constitutional: He is oriented to person, place, and time. He appears well-developed and well-nourished.  HENT:  Head: Normocephalic and atraumatic.  Eyes: Pupils are equal, round, and reactive to light. Conjunctivae are normal. Right eye exhibits no discharge. Left eye exhibits no discharge. No scleral icterus.  Neck: Normal range of motion. No JVD present. No tracheal deviation present.  Pulmonary/Chest: Effort normal. No stridor.  Genitourinary:  Genitourinary Comments: Small amount of excoriation around the anus, no vesicles or lesions rectal exam without pain, no masses, no blood, yellow discharge noted    Neurological: He is alert and oriented to person, place, and time. Coordination normal.  Psychiatric: He has a normal mood and affect. His behavior is normal. Judgment and thought content normal.  Nursing note and vitals reviewed.    ED Treatments / Results  Labs (all labs ordered are listed, but only abnormal results are displayed) Labs Reviewed  URINALYSIS, ROUTINE W REFLEX MICROSCOPIC  GC/CHLAMYDIA PROBE AMP ([facility]) NOT AT [facility]    EKG None  Radiology No results found.  Procedures Procedures (including critical care time)  Medications Ordered in ED Medications  cefTRIAXone (ROCEPHIN) injection 250 mg (250 mg Intramuscular Given 12/19/17 1427)  lidocaine (PF) (XYLOCAINE) 1 % injection (0.9 mLs  Given 12/19/17 1427)     Initial Impression / Assessment and Plan / ED Course  I have reviewed the triage vital signs and the nursing notes.  Pertinent labs & imaging results that were available during my care of the patient were  reviewed by me and considered in my medical decision making (see chart for details).     Final Clinical Impressions(s) / ED Diagnoses   Final diagnoses:  Proctitis   Labs:   Imaging:  Consults: Case management  Therapeutics: Ceftriaxone  Discharge Meds: Doxycycline  Assessment/Plan: 32 year old male presents today with proctitis likely secondary to STD.  He is very well-appearing in no acute distress.  Afebrile, nontender nonsevere rectal exam.  Patient without any blood.  No signs of any other infection.  Given patient's sexual history with a part of the same he will be treated for STD.  Patient will follow closely as an outpatient, return immediately if new or worsening signs or symptoms present.  Patient verbalized understanding and agreement to today's plan had no further questions or concerns  I spoke with case management who would be sending prescription to pharmacy for pickup as patient does not have any money to fill  prescription.     ED Discharge Orders        Ordered    doxycycline (VIBRAMYCIN) 100 MG capsule  2 times daily     12/19/17 1453       Eyvonne Mechanic, PA-C 12/19/17 1457    Alvira Monday, MD 12/20/17 1350

## 2017-12-19 NOTE — Discharge Instructions (Addendum)
Please read attached information. If you experience any new or worsening signs or symptoms please return to the emergency room for evaluation. Please follow-up with your primary care provider or specialist as discussed. Please use medication prescribed only as directed and discontinue taking if you have any concerning signs or symptoms.   °

## 2017-12-19 NOTE — ED Triage Notes (Signed)
Patient complains of rectal discharge and thinks he has gonorrhea. States that he has anal sex and thinks related

## 2017-12-19 NOTE — Care Management (Signed)
CM consult patient needs medication assistance. Patient homeless unable to afford antibiotics. [facility] to assist with medications.  [facility] [address] Fax sent to [phone],  Confirmation received. No further CM needs identified.

## 2017-12-20 LAB — GC/CHLAMYDIA PROBE AMP (~~LOC~~) NOT AT ARMC
Chlamydia: POSITIVE — AB
Neisseria Gonorrhea: POSITIVE — AB

## 2018-01-02 ENCOUNTER — Other Ambulatory Visit: Payer: Self-pay | Admitting: Family Medicine

## 2018-01-12 ENCOUNTER — Emergency Department (HOSPITAL_COMMUNITY)
Admission: EM | Admit: 2018-01-12 | Discharge: 2018-01-13 | Disposition: A | Discharge status: Other Acute Inpatient Hospital | Payer: Self-pay | Attending: Emergency Medicine | Admitting: Emergency Medicine

## 2018-01-12 ENCOUNTER — Encounter (HOSPITAL_COMMUNITY): Payer: Self-pay | Admitting: Emergency Medicine

## 2018-01-12 DIAGNOSIS — J45909 Unspecified asthma, uncomplicated: Secondary | ICD-10-CM | POA: Insufficient documentation

## 2018-01-12 DIAGNOSIS — F17210 Nicotine dependence, cigarettes, uncomplicated: Secondary | ICD-10-CM | POA: Insufficient documentation

## 2018-01-12 DIAGNOSIS — R45851 Suicidal ideations: Secondary | ICD-10-CM

## 2018-01-12 DIAGNOSIS — F315 Bipolar disorder, current episode depressed, severe, with psychotic features: Secondary | ICD-10-CM | POA: Insufficient documentation

## 2018-01-12 DIAGNOSIS — Z91010 Allergy to peanuts: Secondary | ICD-10-CM | POA: Insufficient documentation

## 2018-01-12 DIAGNOSIS — Z79899 Other long term (current) drug therapy: Secondary | ICD-10-CM | POA: Insufficient documentation

## 2018-01-12 DIAGNOSIS — I10 Essential (primary) hypertension: Secondary | ICD-10-CM | POA: Insufficient documentation

## 2018-01-12 DIAGNOSIS — F3289 Other specified depressive episodes: Secondary | ICD-10-CM | POA: Insufficient documentation

## 2018-01-12 LAB — COMPREHENSIVE METABOLIC PANEL
ALBUMIN: 3.9 g/dL (ref 3.5–5.0)
ALK PHOS: 75 U/L (ref 38–126)
ALT: 219 U/L — AB (ref 17–63)
AST: 131 U/L — AB (ref 15–41)
Anion gap: 7 (ref 5–15)
BUN: 11 mg/dL (ref 6–20)
CALCIUM: 9.3 mg/dL (ref 8.9–10.3)
CO2: 22 mmol/L (ref 22–32)
CREATININE: 1.05 mg/dL (ref 0.61–1.24)
Chloride: 107 mmol/L (ref 101–111)
GFR calc Af Amer: >60 mL/min (ref >60)
GFR calc non Af Amer: >60 mL/min (ref >60)
GLUCOSE: 136 mg/dL — AB (ref 65–99)
Potassium: 3.9 mmol/L (ref 3.5–5.1)
SODIUM: 136 mmol/L (ref 135–145)
Total Bilirubin: 1 mg/dL (ref 0.3–1.2)
Total Protein: 6.9 g/dL (ref 6.5–8.1)

## 2018-01-12 LAB — SALICYLATE LEVEL: Salicylate Lvl: <7 mg/dL (ref 2.8–30.0)

## 2018-01-12 LAB — CBC
HEMATOCRIT: 44.9 % (ref 39.0–52.0)
HEMOGLOBIN: 14.9 g/dL (ref 13.0–17.0)
MCH: 28.8 pg (ref 26.0–34.0)
MCHC: 33.2 g/dL (ref 30.0–36.0)
MCV: 86.7 fL (ref 78.0–100.0)
Platelets: 256 10*3/uL (ref 150–400)
RBC: 5.18 MIL/uL (ref 4.22–5.81)
RDW: 14 % (ref 11.5–15.5)
WBC: 7.7 10*3/uL (ref 4.0–10.5)

## 2018-01-12 LAB — ACETAMINOPHEN LEVEL

## 2018-01-12 LAB — RAPID URINE DRUG SCREEN, HOSP PERFORMED
AMPHETAMINES: NOT DETECTED
BARBITURATES: NOT DETECTED
Benzodiazepines: NOT DETECTED
Cocaine: POSITIVE — AB
Opiates: NOT DETECTED
TETRAHYDROCANNABINOL: POSITIVE — AB

## 2018-01-12 LAB — ETHANOL: Alcohol, Ethyl (B): <10 mg/dL (ref <10)

## 2018-01-12 MED ORDER — CITALOPRAM HYDROBROMIDE 10 MG PO TABS
20.0000 mg | ORAL_TABLET | Freq: Every day | ORAL | Status: DC
  Administered 2018-01-12 – 2018-01-13 (×2): 20 mg via ORAL
  Filled 2018-01-12 (×2): qty 2

## 2018-01-12 MED ORDER — BUDESONIDE 0.25 MG/2ML IN SUSP
0.2500 mg | Freq: Two times a day (BID) | RESPIRATORY_TRACT | Status: DC | PRN
  Filled 2018-01-12: qty 2

## 2018-01-12 MED ORDER — HYDROXYZINE HCL 50 MG PO TABS: 50.0000 mg | ORAL_TABLET | Freq: Three times a day (TID) | ORAL | Status: DC | PRN

## 2018-01-12 MED ORDER — PALIPERIDONE PALMITATE 234 MG/1.5ML IM SUSP
234.0000 mg | INTRAMUSCULAR | Status: DC
  Administered 2018-01-12: 234 mg via INTRAMUSCULAR
  Filled 2018-01-12: qty 1.5

## 2018-01-12 MED ORDER — FLUTICASONE PROPIONATE HFA 44 MCG/ACT IN AERO: 2.0000 | INHALATION_SPRAY | Freq: Two times a day (BID) | RESPIRATORY_TRACT | Status: DC | PRN

## 2018-01-12 MED ORDER — PALIPERIDONE PALMITATE 234 MG/1.5ML IM SUSP
234.0000 mg | INTRAMUSCULAR | Status: DC
  Filled 2018-01-12: qty 1.5

## 2018-01-12 MED ORDER — BICTEGRAVIR-EMTRICITAB-TENOFOV 50-200-25 MG PO TABS
1.0000 | ORAL_TABLET | Freq: Every day | ORAL | Status: DC
  Administered 2018-01-12: 1 via ORAL
  Filled 2018-01-12 (×2): qty 1

## 2018-01-12 MED ORDER — BUSPIRONE HCL 10 MG PO TABS
10.0000 mg | ORAL_TABLET | Freq: Two times a day (BID) | ORAL | Status: DC
  Administered 2018-01-12 – 2018-01-13 (×3): 10 mg via ORAL
  Filled 2018-01-12 (×3): qty 1

## 2018-01-12 MED ORDER — ALBUTEROL SULFATE HFA 108 (90 BASE) MCG/ACT IN AERS
2.0000 | INHALATION_SPRAY | Freq: Four times a day (QID) | RESPIRATORY_TRACT | Status: DC | PRN
  Administered 2018-01-12: 2 via RESPIRATORY_TRACT
  Filled 2018-01-12: qty 6.7

## 2018-01-12 MED ORDER — TRAZODONE HCL 100 MG PO TABS: 100.0000 mg | ORAL_TABLET | Freq: Every day | ORAL | Status: DC

## 2018-01-12 MED ORDER — GABAPENTIN 300 MG PO CAPS
300.0000 mg | ORAL_CAPSULE | Freq: Three times a day (TID) | ORAL | Status: DC
  Administered 2018-01-12 – 2018-01-13 (×4): 300 mg via ORAL
  Filled 2018-01-12 (×4): qty 1

## 2018-01-12 NOTE — ED Notes (Signed)
regular breakfast tray ordered

## 2018-01-12 NOTE — ED Notes (Addendum)
PT affect improving.  Smiling, speaking with sitter. Pt given snack.

## 2018-01-12 NOTE — ED Triage Notes (Signed)
Patient reports suicidal ideation for several days , plans to overdose on drugs , occasional auditory hallucinations and depression .

## 2018-01-12 NOTE — ED Notes (Signed)
Maroon paper scrubs given to pt. , security notified to [facility] [employer] , staffing office notified for pt.'s sitter .

## 2018-01-12 NOTE — ED Notes (Signed)
[place] sandwich/water given to pt.

## 2018-01-12 NOTE — Progress Notes (Signed)
Pt meets inpatient criteria per Assunta FoundShuvon Rankin, NP. Referral information has been sent to the following hospitals: [facility]  [facility]  [facility]  [facility]  [facility]  Disposition will continue to assist with placement needs.   Wells GuilesSarah Kristie Bracewell, LCSW, LCAS Disposition CSW [facility] [phone]

## 2018-01-12 NOTE — ED Notes (Signed)
Regular Diet was ordered for Dinner. 

## 2018-01-12 NOTE — ED Notes (Signed)
Pt moved to E48 for Tele Psyche Consult

## 2018-01-12 NOTE — ED Provider Notes (Signed)
[facility]  CSN: [id number] Arrival date & time: 01/12/18 [id number]  Chief Complaint(s) Suicidal  HPI Edwin Martinez is a 32 y.o. male   The history is provided by the patient.  Mental Health Problem  Presenting symptoms: suicidal thoughts   Degree of incapacity (severity):  Moderate Onset quality:  Gradual Duration: several days. Timing:  Intermittent Progression:  Waxing and waning Chronicity:  Recurrent Context: alcohol use and drug abuse   Treatment compliance:  All of the time (per patient) Relieved by:  Nothing Associated symptoms: anxiety and insomnia   Risk factors: hx of mental illness     Past Medical History Past Medical History:  Diagnosis Date  . ADHD (attention deficit hyperactivity disorder) 09/12/2012  . Anxiety   . Asthma   . Bipolar 1 disorder (HCC)   . Depression   . HIV (human immunodeficiency virus infection) (HCC) dx'd 2008  . Hypertension   . Schizophrenia (HCC)    "borderline"  . Seizures (HCC)    "used to have little black-out szs where I'd drop out for 2-3 min then come back; nothing in the last 2-3 years" (08/16/2017)   Patient Active Problem List   Diagnosis Date Noted  . Depression 11/08/2017  . Chest tightness   . Cough   . Leukocytosis   . SOB (shortness of breath)   . Nausea vomiting and diarrhea   . Asthma exacerbation 08/15/2017  . Elevated LFTs 08/12/2017  . Cocaine abuse with cocaine-induced mood disorder (HCC) 03/30/2017  . Herpes zoster 03/06/2017  . Polysubstance abuse (HCC) 03/06/2017  . Homelessness 03/06/2017  . Schizoaffective disorder, bipolar type (HCC) 12/23/2016  . Suicidal ideation   . Cannabis use disorder, moderate, dependence (HCC) 07/27/2016  . Tobacco user 07/27/2016  . Intentional drug overdose (HCC) 07/18/2016  . Asthma 05/20/2007  . HIV (human immunodeficiency virus infection) (HCC) 05/05/2007   Home Medication(s) Prior to Admission  medications   Medication Sig Start Date End Date Taking? Authorizing Provider  albuterol (PROVENTIL HFA;VENTOLIN HFA) 108 (90 Base) MCG/ACT inhaler Inhale 2 puffs into the lungs every 6 (six) hours as needed for wheezing or shortness of breath. 08/17/17  Yes McDiarmid, Leighton Roachodd D, MD  bictegravir-emtricitabine-tenofovir AF (BIKTARVY) 50-200-25 MG TABS tablet Take 1 tablet by mouth daily. 11/08/17  Yes Judyann MunsonSnider, Cynthia, MD  busPIRone (BUSPAR) 5 MG tablet Take 2 tablets (10 mg total) by mouth 2 (two) times daily. 08/17/17  Yes McDiarmid, Leighton Roachodd D, MD  citalopram (CELEXA) 20 MG tablet Take 1 tablet (20 mg total) by mouth daily. 08/17/17  Yes McDiarmid, Leighton Roachodd D, MD  fluticasone (FLOVENT HFA) 44 MCG/ACT inhaler Inhale 2 puffs into the lungs 2 (two) times daily. Patient taking differently: Inhale 2 puffs into the lungs 2 (two) times daily as needed (SOB).  08/17/17 08/17/18 Yes McDiarmid, Leighton Roachodd D, MD  gabapentin (NEURONTIN) 300 MG capsule Take 1 capsule (300 mg total) by mouth 3 (three) times daily. 08/17/17  Yes McDiarmid, Leighton Roachodd D, MD  hydrOXYzine (ATARAX/VISTARIL) 50 MG tablet Take 1 tablet (50 mg total) by mouth 3 (three) times daily as needed for anxiety. 08/17/17  Yes McDiarmid, Leighton Roachodd D, MD  traZODone (DESYREL) 100 MG tablet Take 1 tablet (100 mg total) by mouth at bedtime. 08/17/17  Yes McDiarmid, Leighton Roachodd D, MD  paliperidone (INVEGA SUSTENNA) 234 MG/1.5ML SUSP injection Inject 234 mg every 28 (twenty-eight) days into the muscle. Patient not taking: Reported on 11/08/2017 09/07/17   Shari ProwsPucilowska, Jolanta B, MD  Past Surgical History Past Surgical History:  Procedure Laterality Date  . DENTAL SURGERY     "had my eye teeth pulled down"   Family History Family History  Problem Relation Age of Onset  . Huntington's disease Father   . Heart disease Mother   . Suicidality Maternal Uncle     . Suicidality Maternal Grandmother     Social History Social History   Tobacco Use  . Smoking status: Current Every Day Smoker    Packs/day: 0.50    Years: 20.00    Pack years: 10.00    Types: Cigarettes    Start date: 09/29/1991  . Smokeless tobacco: Never Used  Substance Use Topics  . Alcohol use: Yes    Alcohol/week: 1.8 oz    Types: 3 Cans of beer per week  . Drug use: Yes    Types: Cocaine, Marijuana   Allergies Magnesium-containing compounds; Peanut-containing drug products; Esomeprazole magnesium; Atripla [efavirenz-emtricitab-tenofovir]; Bactrim [sulfamethoxazole-trimethoprim]; and Penicillins  Review of Systems Review of Systems  Psychiatric/Behavioral: Positive for suicidal ideas. The patient is nervous/anxious and has insomnia.    All other systems are reviewed and are negative for acute change except as noted in the HPI  Physical Exam Vital Signs  I have reviewed the triage vital signs BP (!) 104/45 (BP Location: Right Arm)   Pulse (!) 53   Temp 98.6 F (37 C) (Oral)   Resp 16   SpO2 98%   Physical Exam  Constitutional: He is oriented to person, place, and time. He appears well-developed and well-nourished. No distress.  HENT:  Head: Normocephalic and atraumatic.  Right Ear: External ear normal.  Left Ear: External ear normal.  Nose: Nose normal.  Mouth/Throat: Mucous membranes are normal. No trismus in the jaw.  Eyes: Conjunctivae and EOM are normal. No scleral icterus.  Neck: Normal range of motion and phonation normal.  Cardiovascular: Normal rate and regular rhythm.  Pulmonary/Chest: Effort normal. No stridor. No respiratory distress.  Abdominal: He exhibits no distension.  Musculoskeletal: Normal range of motion. He exhibits no edema.  Neurological: He is alert and oriented to person, place, and time.  Skin: He is not diaphoretic.  Psychiatric: He has a normal mood and affect. His behavior is normal.  Vitals reviewed.   ED Results and  Treatments Labs (all labs ordered are listed, but only abnormal results are displayed) Labs Reviewed  COMPREHENSIVE METABOLIC PANEL - Abnormal; Notable for the following components:      Result Value   Glucose, Bld 136 (*)    AST 131 (*)    ALT 219 (*)    All other components within normal limits  ACETAMINOPHEN LEVEL - Abnormal; Notable for the following components:   Acetaminophen (Tylenol), Serum <10 (*)    All other components within normal limits  RAPID URINE DRUG SCREEN, HOSP PERFORMED - Abnormal; Notable for the following components:   Cocaine POSITIVE (*)    Tetrahydrocannabinol POSITIVE (*)    All other components within normal limits  ETHANOL  SALICYLATE LEVEL  CBC  EKG  EKG Interpretation  Date/Time:    Ventricular Rate:    PR Interval:    QRS Duration:   QT Interval:    QTC Calculation:   R Axis:     Text Interpretation:        Radiology No results found. Pertinent labs & imaging results that were available during my care of the patient were reviewed by me and considered in my medical decision making (see chart for details).  Medications Ordered in ED Medications  albuterol (PROVENTIL HFA;VENTOLIN HFA) 108 (90 Base) MCG/ACT inhaler 2 puff (has no administration in time range)  bictegravir-emtricitabine-tenofovir AF (BIKTARVY) 50-200-25 MG per tablet 1 tablet (has no administration in time range)  busPIRone (BUSPAR) tablet 10 mg (has no administration in time range)  citalopram (CELEXA) tablet 20 mg (has no administration in time range)  fluticasone (FLOVENT HFA) 44 MCG/ACT inhaler 2 puff (has no administration in time range)  gabapentin (NEURONTIN) capsule 300 mg (has no administration in time range)  hydrOXYzine (ATARAX/VISTARIL) tablet 50 mg (has no administration in time range)  paliperidone (INVEGA SUSTENNA) injection 234 mg (has no  administration in time range)  traZODone (DESYREL) tablet 100 mg (has no administration in time range)                                                                                                                                    Procedures Procedures  (including critical care time)  Medical Decision Making / ED Course I have reviewed the nursing notes for this encounter and the patient's prior records (if available in EHR or on provided paperwork).    Suicidal ideation with plan to OD on home meds. Endorsing auditory hallucinations. No HI. No recent infections.   On record review, last CD4 >800 in Feb. Compliant with HIV meds.  Screening labs with mild transaminitis with +UDS for cocaine and THC. Otherwise reassuring.  Medically cleared for [facility] eval and treatment. Home meds ordered.    Final Clinical Impression(s) / ED Diagnoses Final diagnoses:  Other depression  Suicidal ideation      This chart was dictated using voice recognition software.  Despite best efforts to proofread,  errors can occur which can change the documentation meaning.   Nira Conn, MD 01/12/18 [id number]

## 2018-01-12 NOTE — BH Assessment (Signed)
Tele Assessment Note   Patient Name: Edwin Martinez MRN: [id number] Referring Physician: Drema Pry Location of Patient: [facility] Location of Provider: Behavioral Health TTS Department  Edwin Martinez is an 32 y.o. male. Pt reports SI with a plan to overdose. Pt denies HI. Pt reports AH. According to the Pt, he hear voices telling him "you're nothing." Pt reports 5 previous SI attempts. Per Pt he is depressed due to 3 family members recently dying from Huntington's disease. Pt states he is a Pt at [employer] but he does not have a therapist or psychiatrist. Per Pt he is prescribed Risperdal, Buspar, and Gapapentin. Pt states his medication is not effective. Pt reports multiple hosptializations.   Shuvon, NP recommends inpatient treatment.  Diagnosis: F31.5 Bipolar, with psychotic features  Past Medical History:  Past Medical History:  Diagnosis Date  . ADHD (attention deficit hyperactivity disorder) 09/12/2012  . Anxiety   . Asthma   . Bipolar 1 disorder (HCC)   . Depression   . HIV (human immunodeficiency virus infection) (HCC) dx'd 2008  . Hypertension   . Schizophrenia (HCC)    "borderline"  . Seizures (HCC)    "used to have little black-out szs where I'd drop out for 2-3 min then come back; nothing in the last 2-3 years" (08/16/2017)    Past Surgical History:  Procedure Laterality Date  . DENTAL SURGERY     "had my eye teeth pulled down"    Family History:  Family History  Problem Relation Age of Onset  . Huntington's disease Father   . Heart disease Mother   . Suicidality Maternal Uncle   . Suicidality Maternal Grandmother     Social History:  reports that he has been smoking cigarettes.  He started smoking about 26 years ago. He has a 10.00 pack-year smoking history. He has never used smokeless tobacco. He reports that he drinks about 1.8 oz of alcohol per week. He reports that he has current or past drug history. Drugs: Cocaine and Marijuana.  Additional  Social History:  Alcohol / Drug Use Pain Medications: please see mar Prescriptions: please see mar Over the Counter: please see mar History of alcohol / drug use?: Yes Longest period of sobriety (when/how long): NA Substance #1 Name of Substance 1: cocaine 1 - Age of First Use: unknown 1 - Amount (size/oz): grams 1 - Frequency: daily 1 - Duration: ongoing 1 - Last Use / Amount: 01/11/18 Substance #2 Name of Substance 2: marijuana 2 - Age of First Use: unknown 2 - Amount (size/oz): "a little" 2 - Frequency: daily 2 - Duration: ongoing 2 - Last Use / Amount: 01/11/18  CIWA: CIWA-Ar BP: (!) 104/45 Pulse Rate: (!) 53 COWS:    Allergies:  Allergies  Allergen Reactions  . Magnesium-Containing Compounds Other (See Comments)    This medication is contraindicated with pts HIV meds.    . Peanut-Containing Drug Products Anaphylaxis  . Esomeprazole Magnesium Cough  . Atripla [Efavirenz-Emtricitab-Tenofovir] Other (See Comments)    Reaction:  Suicidal thoughts   . Bactrim [Sulfamethoxazole-Trimethoprim] Rash  . Penicillins Rash and Other (See Comments)    Has patient had a PCN reaction causing immediate rash, facial/tongue/throat swelling, SOB or lightheadedness with hypotension: Yes Has patient had a PCN reaction causing severe rash involving mucus membranes or skin necrosis: No Has patient had a PCN reaction that required hospitalization No Has patient had a PCN reaction occurring within the last 10 years: No If all of the above answers are "NO", then  may proceed with Cephalosporin use.    Home Medications:  (Not in a hospital admission)  OB/GYN Status:  No LMP for male patient.  General Assessment Data Location of Assessment: [facility] ED TTS Assessment: In system Is this a Tele or Face-to-Face Assessment?: Tele Assessment Is this an Initial Assessment or a Re-assessment for this encounter?: Initial Assessment Marital status: Single Maiden name: NA Is patient pregnant?:  No Pregnancy Status: No Living Arrangements: Other (Comment) Can pt return to current living arrangement?: Yes Admission Status: Voluntary Is patient capable of signing voluntary admission?: Yes Referral Source: Self/Family/Friend Insurance type: SP     Crisis Care Plan Living Arrangements: Other (Comment) Legal Guardian: Other:(self) Name of Psychiatrist: NA Name of Therapist: NA  Education Status Is patient currently in school?: No Is the patient employed, unemployed or receiving disability?: Unemployed  Risk to self with the past 6 months Suicidal Ideation: Yes-Currently Present Has patient been a risk to self within the past 6 months prior to admission? : No Suicidal Intent: Yes-Currently Present Has patient had any suicidal intent within the past 6 months prior to admission? : No Is patient at risk for suicide?: Yes Suicidal Plan?: Yes-Currently Present Has patient had any suicidal plan within the past 6 months prior to admission? : No Specify Current Suicidal Plan: to overdosed Access to Means: Yes Specify Access to Suicidal Means: access to pills What has been your use of drugs/alcohol within the last 12 months?: cocaine and marijuana Previous Attempts/Gestures: Yes How many times?: 5 Other Self Harm Risks: NA Triggers for Past Attempts: None known Intentional Self Injurious Behavior: None Family Suicide History: No Recent stressful life event(s): Loss (Comment) Persecutory voices/beliefs?: Yes Depression: Yes Depression Symptoms: Tearfulness, Isolating, Loss of interest in usual pleasures, Fatigue Substance abuse history and/or treatment for substance abuse?: Yes Suicide prevention information given to non-admitted patients: Not applicable  Risk to Others within the past 6 months Homicidal Ideation: No Does patient have any lifetime risk of violence toward others beyond the six months prior to admission? : No Thoughts of Harm to Others: No Current Homicidal  Intent: No Current Homicidal Plan: No Access to Homicidal Means: No Identified Victim: NA History of harm to others?: No Assessment of Violence: None Noted Violent Behavior Description: NA Does patient have access to weapons?: No Criminal Charges Pending?: No Does patient have a court date: No Is patient on probation?: No  Psychosis Hallucinations: None noted Delusions: None noted  Mental Status Report Appearance/Hygiene: Unremarkable Eye Contact: Fair Motor Activity: Freedom of movement Speech: Logical/coherent Level of Consciousness: Alert Mood: Depressed Affect: Depressed Anxiety Level: Minimal Thought Processes: Coherent, Relevant Judgement: Impaired Orientation: Person, Place, Time, Situation Obsessive Compulsive Thoughts/Behaviors: None  Cognitive Functioning Concentration: Normal Memory: Recent Intact, Remote Intact Is patient IDD: No Is patient DD?: No Insight: Fair Impulse Control: Fair Appetite: Fair Have you had any weight changes? : No Change Sleep: Decreased Total Hours of Sleep: 5 Vegetative Symptoms: None  ADLScreening [facility] Assessment Services) Patient's cognitive ability adequate to safely complete daily activities?: Yes Patient able to express need for assistance with ADLs?: No Independently performs ADLs?: Yes (appropriate for developmental age)  Prior Inpatient Therapy Prior Inpatient Therapy: Yes Prior Therapy Dates: 2018 Prior Therapy Facilty/Provider(s): [facility] Reason for Treatment: depression  Prior Outpatient Therapy Prior Outpatient Therapy: Yes Prior Therapy Dates: current Prior Therapy Facilty/Provider(s): [name] Reason for Treatment: depression Does patient have an ACCT team?: No Does patient have Intensive In-House Services?  : No Does patient have [name] services? :  Yes Does patient have P4CC services?: No  ADL Screening (condition at time of admission) Patient's cognitive ability adequate to safely complete daily  activities?: Yes Is the patient deaf or have difficulty hearing?: No Does the patient have difficulty seeing, even when wearing glasses/contacts?: No Does the patient have difficulty concentrating, remembering, or making decisions?: Yes Patient able to express need for assistance with ADLs?: No Does the patient have difficulty dressing or bathing?: No Independently performs ADLs?: Yes (appropriate for developmental age) Does the patient have difficulty walking or climbing stairs?: No       Abuse/Neglect Assessment (Assessment to be complete while patient is alone) Abuse/Neglect Assessment Can Be Completed: Yes Physical Abuse: Denies Verbal Abuse: Denies Sexual Abuse: Denies Exploitation of patient/patient's resources: Denies     Merchant navy officerAdvance Directives (For Healthcare) Does Patient Have a Medical Advance Directive?: No Would patient like information on creating a medical advance directive?: No - Patient declined          Disposition:  Disposition Initial Assessment Completed for this Encounter: Yes  This service was provided via telemedicine using a 2-way, interactive audio and video technology.  Names of all persons participating in this telemedicine service and their role in this encounter. Name: Assunta FoundShuvon Rankin Role: NP  Name:  Role:   Name:  Role:   Name:  Role:     Emmit PomfretLevette,Elizah Lydon D 01/12/2018 9:50 AM

## 2018-01-13 ENCOUNTER — Encounter (HOSPITAL_COMMUNITY): Payer: Self-pay | Admitting: Registered Nurse

## 2018-01-13 ENCOUNTER — Other Ambulatory Visit: Payer: Self-pay

## 2018-01-13 ENCOUNTER — Emergency Department (HOSPITAL_COMMUNITY)
Admission: EM | Admit: 2018-01-13 | Discharge: 2018-01-14 | Disposition: A | Discharge status: Other Acute Inpatient Hospital | Payer: Self-pay | Attending: Emergency Medicine | Admitting: Emergency Medicine

## 2018-01-13 DIAGNOSIS — F209 Schizophrenia, unspecified: Secondary | ICD-10-CM | POA: Insufficient documentation

## 2018-01-13 DIAGNOSIS — F17210 Nicotine dependence, cigarettes, uncomplicated: Secondary | ICD-10-CM | POA: Insufficient documentation

## 2018-01-13 DIAGNOSIS — Z79899 Other long term (current) drug therapy: Secondary | ICD-10-CM | POA: Insufficient documentation

## 2018-01-13 DIAGNOSIS — Z21 Asymptomatic human immunodeficiency virus [HIV] infection status: Secondary | ICD-10-CM | POA: Insufficient documentation

## 2018-01-13 DIAGNOSIS — I10 Essential (primary) hypertension: Secondary | ICD-10-CM | POA: Insufficient documentation

## 2018-01-13 DIAGNOSIS — Z91010 Allergy to peanuts: Secondary | ICD-10-CM | POA: Insufficient documentation

## 2018-01-13 DIAGNOSIS — J45909 Unspecified asthma, uncomplicated: Secondary | ICD-10-CM | POA: Insufficient documentation

## 2018-01-13 DIAGNOSIS — R45851 Suicidal ideations: Secondary | ICD-10-CM | POA: Insufficient documentation

## 2018-01-13 MED ORDER — AMANTADINE HCL 100 MG PO CAPS
100.00 | ORAL_CAPSULE | ORAL | Status: DC
Start: 2018-01-13 — End: 2018-01-13

## 2018-01-13 MED ORDER — GENERIC EXTERNAL MEDICATION
50.00 | Status: DC
Start: 2018-01-14 — End: 2018-01-13

## 2018-01-13 MED ORDER — BUSPIRONE HCL 5 MG PO TABS
5.00 | ORAL_TABLET | ORAL | Status: DC
Start: 2018-01-13 — End: 2018-01-13

## 2018-01-13 MED ORDER — PROMETHAZINE HCL 25 MG PO TABS
12.50 | ORAL_TABLET | ORAL | Status: DC
Start: ? — End: 2018-01-13

## 2018-01-13 MED ORDER — ALUMINUM-MAGNESIUM-SIMETHICONE 200-200-20 MG/5ML PO SUSP
30.00 | ORAL | Status: DC
Start: ? — End: 2018-01-13

## 2018-01-13 MED ORDER — LORAZEPAM 2 MG/ML IJ SOLN
2.00 | INTRAMUSCULAR | Status: DC
Start: ? — End: 2018-01-13

## 2018-01-13 MED ORDER — TRAZODONE HCL 100 MG PO TABS
100.00 | ORAL_TABLET | ORAL | Status: DC
Start: 2018-01-13 — End: 2018-01-13

## 2018-01-13 MED ORDER — IBUPROFEN 400 MG PO TABS
400.00 | ORAL_TABLET | ORAL | Status: DC
Start: ? — End: 2018-01-13

## 2018-01-13 MED ORDER — HYDROXYZINE HCL 25 MG PO TABS
50.00 | ORAL_TABLET | ORAL | Status: DC
Start: ? — End: 2018-01-13

## 2018-01-13 MED ORDER — DOCUSATE SODIUM 100 MG PO CAPS
100.00 | ORAL_CAPSULE | ORAL | Status: DC
Start: ? — End: 2018-01-13

## 2018-01-13 MED ORDER — FLUTICASONE FUROATE 100 MCG/ACT IN AEPB
1.00 | INHALATION_SPRAY | RESPIRATORY_TRACT | Status: DC
Start: 2018-01-14 — End: 2018-01-13

## 2018-01-13 MED ORDER — HALOPERIDOL 5 MG PO TABS
5.00 | ORAL_TABLET | ORAL | Status: DC
Start: ? — End: 2018-01-13

## 2018-01-13 MED ORDER — LORAZEPAM 1 MG PO TABS
1.00 | ORAL_TABLET | ORAL | Status: DC
Start: ? — End: 2018-01-13

## 2018-01-13 MED ORDER — GENERIC EXTERNAL MEDICATION
5.00 | Status: DC
Start: ? — End: 2018-01-13

## 2018-01-13 MED ORDER — RISPERIDONE 3 MG PO TABS
3.00 | ORAL_TABLET | ORAL | Status: DC
Start: 2018-01-13 — End: 2018-01-13

## 2018-01-13 MED ORDER — CLONIDINE HCL 0.1 MG PO TABS
0.10 | ORAL_TABLET | ORAL | Status: DC
Start: ? — End: 2018-01-13

## 2018-01-13 MED ORDER — BENZTROPINE MESYLATE 1 MG PO TABS
1.00 | ORAL_TABLET | ORAL | Status: DC
Start: 2018-01-13 — End: 2018-01-13

## 2018-01-13 NOTE — ED Notes (Signed)
IVC papers faxed to the Magistrate @ [phone]. Awaiting confirmation of successful fax.

## 2018-01-13 NOTE — Progress Notes (Addendum)
CSW spoke with Chris @[facility] and assessed that pt has arrived to the facility via [facility], however he was under the impression that pt was going to be petitioned [facility] and be transported by Patent examinerlaw enforcement. He stated that pt is waiting in the lobby currently and that he is going to speak with their director about what steps to take next.  CSW will continue to monitor situation.   Wells GuilesSarah Alayasia Breeding, LCSW, LCAS Disposition CSW [phone]

## 2018-01-13 NOTE — ED Notes (Signed)
[facility]. Pt to be transported this afternoon to arrive no sooner than 2:00pm. Will be Room 514 G.

## 2018-01-13 NOTE — Progress Notes (Addendum)
[facility] called and stated that after discussing the situation with his director, they will be [facility]. The bed offer is not being rescinded, but an IVC will need to be faxed to their Assessment Team at [phone] for review. He also stated that his director was upset and will be contacting someone about the situation. CSW validated his frustration and informed him that [facility] staff will be notified immediately. CSW spoke with [facility] RN, Aundra MilletMegan and Press photographerCharge nurse, Irving BurtonEmily. They are aware that pt is returning and have begun working on petitioning an IVC order for pt.  CSW will continue to assist pt with placement needs. If an IVC is deemed appropriate and pt is approved to [facility], he will need to be transported by Patent examinerlaw enforcement.   Wells GuilesSarah Aquiles Ruffini, LCSW, LCAS Disposition CSW [facility] [phone]

## 2018-01-13 NOTE — ED Triage Notes (Signed)
Pt arrives to ED after being sent back from [facility], where he was transferred earlier today for IP BH.Marland Kitchen. Pt pleasant, calm, and cooperative upon arrival. No verbalizations of SI at this time. Pt placed in position of comfort with bed locked and lowered, television on.

## 2018-01-13 NOTE — Progress Notes (Signed)
[facility]   Dr. Cherylann RatelBrian Farrah is the accepting provider Call report to [phone] Pt is Voluntary.  Pt may be transported by [facility]  Pt scheduled  to arrive at or after 2 PM [facility] Nurse notified  Timmothy EulerJean T. Kaylyn LimSutter, MSW, LCSWA Disposition Clinical Social Work [phone] (cell) [phone] (office)

## 2018-01-13 NOTE — ED Notes (Signed)
Patient denies pain and is resting comfortably.  

## 2018-01-13 NOTE — ED Notes (Signed)
Pt resting in bed watching television at this time.

## 2018-01-13 NOTE — ED Notes (Signed)
[facility] called to [facility].

## 2018-01-14 NOTE — ED Provider Notes (Signed)
[age] year old [facility]. Stable and transferred in stable condition.    Alvira MondaySchlossman, Merrit Friesen, MD 01/15/18 [phone]

## 2018-01-14 NOTE — ED Notes (Signed)
[employer] here with IVC paperwork.

## 2018-01-14 NOTE — ED Notes (Signed)
Papers found on desk, which look to be initial IVC paperwork but without notary seal. Spoke with Secretary, and states that the secretary that filled the papers out, did not use her seal, therefor the paperwork is invalid. Charge RN, Tori, aware. Spoke with Dr. Blinda LeatherwoodPollina about situation. MD is willing to see patient and redo the IVC papers. Unsure [facility] after [facility] was accepted earlier in the day. Pt sleeping at this time. Will continue to monitor.

## 2018-01-14 NOTE — ED Notes (Signed)
Called behavorial health at [facility]. They still have a bed for pt. Have to fax IVC papers over for them to approve them and they will call [facility] back. IVC papers faxed.

## 2018-01-14 NOTE — ED Notes (Signed)
Drink and snack provided to patient. Calm and cooperative.

## 2018-01-14 NOTE — ED Notes (Signed)
IVC papers faxed to [facility].  

## 2018-01-14 NOTE — ED Notes (Signed)
Breakfast tray ordered 

## 2018-01-28 NOTE — ED Provider Notes (Signed)
[facility] EMERGENCY DEPARTMENT Provider Note   CSN: [id number] Arrival date & time: 01/13/18  1802     History   Chief Complaint Chief Complaint  Patient presents with  . Suicidal    HPI Edwin Martinez is a 32 y.o. male.  HPI Patient transferred back to [facility] from [facility] behavioral health.  Needs to have IVC paperwork completed before acceptance. Patient continues to express suicidal ideation but is calm and cooperative. Past Medical History:  Diagnosis Date  . ADHD (attention deficit hyperactivity disorder) 09/12/2012  . Anxiety   . Asthma   . Bipolar 1 disorder (HCC)   . Depression   . HIV (human immunodeficiency virus infection) (HCC) dx'd 2008  . Hypertension   . Schizophrenia (HCC)    "borderline"  . Seizures (HCC)    "used to have little black-out szs where I'd drop out for 2-3 min then come back; nothing in the last 2-3 years" (08/16/2017)    Patient Active Problem List   Diagnosis Date Noted  . Depression 11/08/2017  . Chest tightness   . Cough   . Leukocytosis   . SOB (shortness of breath)   . Nausea vomiting and diarrhea   . Asthma exacerbation 08/15/2017  . Elevated LFTs 08/12/2017  . Cocaine abuse with cocaine-induced mood disorder (HCC) 03/30/2017  . Herpes zoster 03/06/2017  . Polysubstance abuse (HCC) 03/06/2017  . Homelessness 03/06/2017  . Schizoaffective disorder, bipolar type (HCC) 12/23/2016  . Suicidal ideation   . Cannabis use disorder, moderate, dependence (HCC) 07/27/2016  . Tobacco user 07/27/2016  . Intentional drug overdose (HCC) 07/18/2016  . Asthma 05/20/2007  . HIV (human immunodeficiency virus infection) (HCC) 05/05/2007    Past Surgical History:  Procedure Laterality Date  . DENTAL SURGERY     "had my eye teeth pulled down"        Home Medications    Prior to Admission medications   Medication Sig Start Date End Date Taking? Authorizing Provider  albuterol (PROVENTIL HFA;VENTOLIN  HFA) 108 (90 Base) MCG/ACT inhaler Inhale 2 puffs into the lungs every 6 (six) hours as needed for wheezing or shortness of breath. 08/17/17  Yes McDiarmid, Leighton Roach, MD  bictegravir-emtricitabine-tenofovir AF (BIKTARVY) 50-200-25 MG TABS tablet Take 1 tablet by mouth daily. 11/08/17  Yes Judyann Munson, MD  busPIRone (BUSPAR) 5 MG tablet Take 2 tablets (10 mg total) by mouth 2 (two) times daily. 08/17/17  Yes McDiarmid, Leighton Roach, MD  citalopram (CELEXA) 20 MG tablet Take 1 tablet (20 mg total) by mouth daily. 08/17/17  Yes McDiarmid, Leighton Roach, MD  fluticasone (FLOVENT HFA) 44 MCG/ACT inhaler Inhale 2 puffs into the lungs 2 (two) times daily. Patient taking differently: Inhale 2 puffs into the lungs 2 (two) times daily as needed (SOB).  08/17/17 08/17/18 Yes McDiarmid, Leighton Roach, MD  gabapentin (NEURONTIN) 300 MG capsule Take 1 capsule (300 mg total) by mouth 3 (three) times daily. 08/17/17  Yes McDiarmid, Leighton Roach, MD  hydrOXYzine (ATARAX/VISTARIL) 50 MG tablet Take 1 tablet (50 mg total) by mouth 3 (three) times daily as needed for anxiety. 08/17/17  Yes McDiarmid, Leighton Roach, MD  risperiDONE (RISPERDAL) 1 MG tablet Take 1 mg by mouth 2 (two) times daily.   Yes [provider]  traZODone (DESYREL) 100 MG tablet Take 1 tablet (100 mg total) by mouth at bedtime. 08/17/17  Yes McDiarmid, Leighton Roach, MD  paliperidone (INVEGA SUSTENNA) 234 MG/1.5ML SUSP injection Inject 234 mg every 28 (twenty-eight) days  into the muscle. Patient not taking: Reported on 11/08/2017 09/07/17   Shari Prows, MD    Family History Family History  Problem Relation Age of Onset  . Huntington's disease Father   . Heart disease Mother   . Suicidality Maternal Uncle   . Suicidality Maternal Grandmother     Social History Social History   Tobacco Use  . Smoking status: Current Every Day Smoker    Packs/day: 0.50    Years: 20.00    Pack years: 10.00    Types: Cigarettes    Start date: 09/29/1991  . Smokeless tobacco:  Never Used  Substance Use Topics  . Alcohol use: Yes    Alcohol/week: 1.8 oz    Types: 3 Cans of beer per week  . Drug use: Yes    Types: Cocaine, Marijuana     Allergies   Magnesium-containing compounds; Peanut-containing drug products; Esomeprazole magnesium; Atripla [efavirenz-emtricitab-tenofovir]; Bactrim [sulfamethoxazole-trimethoprim]; and Penicillins   Review of Systems Review of Systems  Constitutional: Negative for chills and fever.  Respiratory: Negative for shortness of breath.   Cardiovascular: Negative for chest pain.  Gastrointestinal: Negative for abdominal pain, nausea and vomiting.  Musculoskeletal: Negative for myalgias and neck pain.  Skin: Negative for rash and wound.  Neurological: Negative for weakness, numbness and headaches.  Psychiatric/Behavioral: Positive for suicidal ideas.  All other systems reviewed and are negative.    Physical Exam Updated Vital Signs BP 123/75 (BP Location: Left Arm)   Pulse 76   Temp 98.7 F (37.1 C) (Oral)   Resp 14   Ht  (1.702 m)   Wt 72.6 kg (160 lb)   SpO2 98%   BMI 25.06 kg/m   Physical Exam  Constitutional: He is oriented to person, place, and time. He appears well-developed and well-nourished.  HENT:  Head: Normocephalic and atraumatic.  Mouth/Throat: Oropharynx is clear and moist.  Eyes: Pupils are equal, round, and reactive to light. EOM are normal.  Neck: Normal range of motion. Neck supple.  Cardiovascular: Normal rate and regular rhythm.  Pulmonary/Chest: Effort normal and breath sounds normal.  Abdominal: Soft. Bowel sounds are normal. There is no tenderness. There is no rebound and no guarding.  Musculoskeletal: Normal range of motion. He exhibits no edema or tenderness.  Neurological: He is alert and oriented to person, place, and time.  Skin: Skin is warm and dry. No rash noted. No erythema.  Psychiatric: He has a normal mood and affect. His behavior is normal.  Nursing note and vitals  reviewed.    ED Treatments / Results  Labs (all labs ordered are listed, but only abnormal results are displayed) Labs Reviewed - No data to display  EKG None  Radiology No results found.  Procedures Procedures (including critical care time)  Medications Ordered in ED Medications - No data to display   Initial Impression / Assessment and Plan / ED Course  I have reviewed the triage vital signs and the nursing notes.  Pertinent labs & imaging results that were available during my care of the patient were reviewed by me and considered in my medical decision making (see chart for details).     IVC paperwork completed and patient is medically cleared to be transferred to inpatient psychiatric facility.  Final Clinical Impressions(s) / ED Diagnoses   Final diagnoses:  Suicidal ideation    ED Discharge Orders    None       Loren Racer, MD 01/28/18 [phone]

## 2018-01-29 ENCOUNTER — Encounter (HOSPITAL_COMMUNITY): Payer: Self-pay | Admitting: Emergency Medicine

## 2018-01-29 ENCOUNTER — Emergency Department (HOSPITAL_COMMUNITY): Payer: Self-pay

## 2018-01-29 ENCOUNTER — Other Ambulatory Visit: Payer: Self-pay

## 2018-01-29 ENCOUNTER — Emergency Department (HOSPITAL_COMMUNITY)
Admission: EM | Admit: 2018-01-29 | Discharge: 2018-01-29 | Disposition: A | Discharge status: Home / Self Care | Payer: Self-pay | Attending: Emergency Medicine | Admitting: Emergency Medicine

## 2018-01-29 DIAGNOSIS — J45909 Unspecified asthma, uncomplicated: Secondary | ICD-10-CM | POA: Insufficient documentation

## 2018-01-29 DIAGNOSIS — Z79899 Other long term (current) drug therapy: Secondary | ICD-10-CM | POA: Insufficient documentation

## 2018-01-29 DIAGNOSIS — Y939 Activity, unspecified: Secondary | ICD-10-CM | POA: Insufficient documentation

## 2018-01-29 DIAGNOSIS — F17210 Nicotine dependence, cigarettes, uncomplicated: Secondary | ICD-10-CM | POA: Insufficient documentation

## 2018-01-29 DIAGNOSIS — Y92480 Sidewalk as the place of occurrence of the external cause: Secondary | ICD-10-CM | POA: Insufficient documentation

## 2018-01-29 DIAGNOSIS — Y998 Other external cause status: Secondary | ICD-10-CM | POA: Insufficient documentation

## 2018-01-29 DIAGNOSIS — B20 Human immunodeficiency virus [HIV] disease: Secondary | ICD-10-CM | POA: Insufficient documentation

## 2018-01-29 DIAGNOSIS — M542 Cervicalgia: Secondary | ICD-10-CM | POA: Insufficient documentation

## 2018-01-29 DIAGNOSIS — S43101A Unspecified dislocation of right acromioclavicular joint, initial encounter: Secondary | ICD-10-CM | POA: Insufficient documentation

## 2018-01-29 HISTORY — DX: Zoster without complications: B02.9

## 2018-01-29 HISTORY — DX: Insomnia, unspecified: G47.00

## 2018-01-29 HISTORY — DX: Other specified behavioral and emotional disorders with onset usually occurring in childhood and adolescence: F98.8

## 2018-01-29 MED ORDER — HYDROCODONE-ACETAMINOPHEN 5-325 MG PO TABS
1.0000 | ORAL_TABLET | ORAL | 0 refills | Status: DC | PRN
Start: 2018-01-29 — End: 2018-02-26

## 2018-01-29 MED ORDER — IBUPROFEN 800 MG PO TABS
800.0000 mg | ORAL_TABLET | Freq: Once | ORAL | Status: AC
  Administered 2018-01-29: 800 mg via ORAL
  Filled 2018-01-29: qty 1

## 2018-01-29 MED ORDER — HYDROCODONE-ACETAMINOPHEN 5-325 MG PO TABS
1.0000 | ORAL_TABLET | Freq: Once | ORAL | Status: AC
  Administered 2018-01-29: 1 via ORAL
  Filled 2018-01-29: qty 1
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
## 2018-01-29 NOTE — ED Notes (Signed)
Pt returned from xray
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
## 2018-01-29 NOTE — ED Notes (Signed)
Pt given sandwich per EDP.
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
## 2018-01-31 ENCOUNTER — Encounter (HOSPITAL_COMMUNITY): Payer: Self-pay | Admitting: Emergency Medicine

## 2018-01-31 ENCOUNTER — Encounter (HOSPITAL_COMMUNITY): Payer: Self-pay | Admitting: Registered Nurse

## 2018-02-25 ENCOUNTER — Encounter (HOSPITAL_COMMUNITY): Payer: Self-pay | Admitting: General Practice

## 2018-02-25 ENCOUNTER — Observation Stay (HOSPITAL_COMMUNITY)
Admission: EM | Admit: 2018-02-25 | Discharge: 2018-02-26 | Disposition: A | Discharge status: Home / Self Care | Payer: Self-pay | Attending: Internal Medicine | Admitting: Internal Medicine

## 2018-02-25 ENCOUNTER — Other Ambulatory Visit: Payer: Self-pay

## 2018-02-25 DIAGNOSIS — T424X2A Poisoning by benzodiazepines, intentional self-harm, initial encounter: Principal | ICD-10-CM | POA: Insufficient documentation

## 2018-02-25 DIAGNOSIS — Z880 Allergy status to penicillin: Secondary | ICD-10-CM | POA: Insufficient documentation

## 2018-02-25 DIAGNOSIS — I10 Essential (primary) hypertension: Secondary | ICD-10-CM | POA: Insufficient documentation

## 2018-02-25 DIAGNOSIS — T43222A Poisoning by selective serotonin reuptake inhibitors, intentional self-harm, initial encounter: Secondary | ICD-10-CM

## 2018-02-25 DIAGNOSIS — Z720 Tobacco use: Secondary | ICD-10-CM

## 2018-02-25 DIAGNOSIS — Z882 Allergy status to sulfonamides status: Secondary | ICD-10-CM | POA: Insufficient documentation

## 2018-02-25 DIAGNOSIS — T50902A Poisoning by unspecified drugs, medicaments and biological substances, intentional self-harm, initial encounter: Secondary | ICD-10-CM

## 2018-02-25 DIAGNOSIS — F1414 Cocaine abuse with cocaine-induced mood disorder: Secondary | ICD-10-CM

## 2018-02-25 DIAGNOSIS — F17210 Nicotine dependence, cigarettes, uncomplicated: Secondary | ICD-10-CM | POA: Insufficient documentation

## 2018-02-25 DIAGNOSIS — B20 Human immunodeficiency virus [HIV] disease: Secondary | ICD-10-CM | POA: Insufficient documentation

## 2018-02-25 DIAGNOSIS — T1491XA Suicide attempt, initial encounter: Secondary | ICD-10-CM | POA: Diagnosis present

## 2018-02-25 DIAGNOSIS — B1920 Unspecified viral hepatitis C without hepatic coma: Secondary | ICD-10-CM | POA: Insufficient documentation

## 2018-02-25 DIAGNOSIS — R440 Auditory hallucinations: Secondary | ICD-10-CM

## 2018-02-25 DIAGNOSIS — J45909 Unspecified asthma, uncomplicated: Secondary | ICD-10-CM | POA: Insufficient documentation

## 2018-02-25 DIAGNOSIS — J4520 Mild intermittent asthma, uncomplicated: Secondary | ICD-10-CM

## 2018-02-25 DIAGNOSIS — F418 Other specified anxiety disorders: Secondary | ICD-10-CM | POA: Insufficient documentation

## 2018-02-25 DIAGNOSIS — F1910 Other psychoactive substance abuse, uncomplicated: Secondary | ICD-10-CM | POA: Diagnosis present

## 2018-02-25 DIAGNOSIS — F329 Major depressive disorder, single episode, unspecified: Secondary | ICD-10-CM

## 2018-02-25 DIAGNOSIS — Z79899 Other long term (current) drug therapy: Secondary | ICD-10-CM | POA: Insufficient documentation

## 2018-02-25 DIAGNOSIS — R945 Abnormal results of liver function studies: Secondary | ICD-10-CM

## 2018-02-25 DIAGNOSIS — Z590 Homelessness: Secondary | ICD-10-CM | POA: Insufficient documentation

## 2018-02-25 DIAGNOSIS — R45851 Suicidal ideations: Secondary | ICD-10-CM | POA: Insufficient documentation

## 2018-02-25 DIAGNOSIS — F1410 Cocaine abuse, uncomplicated: Secondary | ICD-10-CM | POA: Insufficient documentation

## 2018-02-25 DIAGNOSIS — F32A Depression, unspecified: Secondary | ICD-10-CM | POA: Diagnosis present

## 2018-02-25 DIAGNOSIS — F419 Anxiety disorder, unspecified: Secondary | ICD-10-CM

## 2018-02-25 DIAGNOSIS — F259 Schizoaffective disorder, unspecified: Secondary | ICD-10-CM | POA: Diagnosis present

## 2018-02-25 DIAGNOSIS — F909 Attention-deficit hyperactivity disorder, unspecified type: Secondary | ICD-10-CM | POA: Insufficient documentation

## 2018-02-25 DIAGNOSIS — R7989 Other specified abnormal findings of blood chemistry: Secondary | ICD-10-CM | POA: Diagnosis present

## 2018-02-25 DIAGNOSIS — T43292A Poisoning by other antidepressants, intentional self-harm, initial encounter: Secondary | ICD-10-CM | POA: Insufficient documentation

## 2018-02-25 DIAGNOSIS — F250 Schizoaffective disorder, bipolar type: Secondary | ICD-10-CM | POA: Insufficient documentation

## 2018-02-25 HISTORY — DX: Poisoning by unspecified drugs, medicaments and biological substances, intentional self-harm, initial encounter: T50.902A

## 2018-02-25 HISTORY — DX: Other psychoactive substance abuse, uncomplicated: F19.10

## 2018-02-25 LAB — RAPID URINE DRUG SCREEN, HOSP PERFORMED
Amphetamines: NOT DETECTED
BARBITURATES: NOT DETECTED
Benzodiazepines: NOT DETECTED
Cocaine: NOT DETECTED
Opiates: NOT DETECTED
TETRAHYDROCANNABINOL: NOT DETECTED

## 2018-02-25 LAB — SALICYLATE LEVEL

## 2018-02-25 LAB — CBC
HEMATOCRIT: 45.2 % (ref 39.0–52.0)
HEMOGLOBIN: 14.9 g/dL (ref 13.0–17.0)
MCH: 28 pg (ref 26.0–34.0)
MCHC: 33 g/dL (ref 30.0–36.0)
MCV: 85 fL (ref 78.0–100.0)
Platelets: 269 10*3/uL (ref 150–400)
RBC: 5.32 MIL/uL (ref 4.22–5.81)
RDW: 13.5 % (ref 11.5–15.5)
WBC: 9.8 10*3/uL (ref 4.0–10.5)

## 2018-02-25 LAB — ETHANOL: Alcohol, Ethyl (B): 129 mg/dL — ABNORMAL HIGH (ref <10)

## 2018-02-25 LAB — COMPREHENSIVE METABOLIC PANEL
ALT: 85 U/L — AB (ref 17–63)
AST: 56 U/L — ABNORMAL HIGH (ref 15–41)
Albumin: 4.1 g/dL (ref 3.5–5.0)
Alkaline Phosphatase: 87 U/L (ref 38–126)
Anion gap: 11 (ref 5–15)
BILIRUBIN TOTAL: 0.6 mg/dL (ref 0.3–1.2)
BUN: 7 mg/dL (ref 6–20)
CALCIUM: 9.3 mg/dL (ref 8.9–10.3)
CO2: 20 mmol/L — AB (ref 22–32)
CREATININE: 1.06 mg/dL (ref 0.61–1.24)
Chloride: 109 mmol/L (ref 101–111)
GFR calc non Af Amer: >60 mL/min (ref >60)
GLUCOSE: 127 mg/dL — AB (ref 65–99)
Potassium: 3.6 mmol/L (ref 3.5–5.1)
SODIUM: 140 mmol/L (ref 135–145)
TOTAL PROTEIN: 6.8 g/dL (ref 6.5–8.1)

## 2018-02-25 LAB — ACETAMINOPHEN LEVEL: Acetaminophen (Tylenol), Serum: <10 ug/mL — ABNORMAL LOW (ref 10–30)

## 2018-02-25 LAB — CBG MONITORING, ED: Glucose-Capillary: 110 mg/dL — ABNORMAL HIGH (ref 65–99)

## 2018-02-25 MED ORDER — LORAZEPAM 2 MG/ML IJ SOLN: 1.0000 mg | INTRAMUSCULAR | Status: DC | PRN

## 2018-02-25 MED ORDER — RISPERIDONE 1 MG PO TABS
1.0000 mg | ORAL_TABLET | Freq: Two times a day (BID) | ORAL | Status: DC
  Administered 2018-02-25 – 2018-02-26 (×3): 1 mg via ORAL
  Filled 2018-02-25 (×4): qty 1

## 2018-02-25 MED ORDER — ONDANSETRON HCL 4 MG/2ML IJ SOLN
4.0000 mg | Freq: Three times a day (TID) | INTRAMUSCULAR | Status: DC | PRN
Start: 2018-02-25 — End: 2018-02-26

## 2018-02-25 MED ORDER — IBUPROFEN 400 MG PO TABS: 400.0000 mg | ORAL_TABLET | Freq: Four times a day (QID) | ORAL | Status: DC | PRN

## 2018-02-25 MED ORDER — BUDESONIDE 0.25 MG/2ML IN SUSP
0.2500 mg | Freq: Two times a day (BID) | RESPIRATORY_TRACT | Status: DC
  Administered 2018-02-25 – 2018-02-26 (×2): 0.25 mg via RESPIRATORY_TRACT
  Filled 2018-02-25 (×4): qty 2

## 2018-02-25 MED ORDER — BUSPIRONE HCL 10 MG PO TABS
10.0000 mg | ORAL_TABLET | Freq: Two times a day (BID) | ORAL | Status: DC
  Administered 2018-02-25: 10 mg via ORAL
  Filled 2018-02-25 (×2): qty 1

## 2018-02-25 MED ORDER — SENNOSIDES-DOCUSATE SODIUM 8.6-50 MG PO TABS: 1.0000 | ORAL_TABLET | Freq: Every evening | ORAL | Status: DC | PRN

## 2018-02-25 MED ORDER — ACETAMINOPHEN 325 MG PO TABS: 650.0000 mg | ORAL_TABLET | Freq: Four times a day (QID) | ORAL | Status: DC | PRN

## 2018-02-25 MED ORDER — NICOTINE 21 MG/24HR TD PT24
21.0000 mg | MEDICATED_PATCH | Freq: Every day | TRANSDERMAL | Status: DC
  Filled 2018-02-25 (×2): qty 1

## 2018-02-25 MED ORDER — GABAPENTIN 300 MG PO CAPS
300.0000 mg | ORAL_CAPSULE | Freq: Three times a day (TID) | ORAL | Status: DC
  Administered 2018-02-25 – 2018-02-26 (×4): 300 mg via ORAL
  Filled 2018-02-25 (×5): qty 1

## 2018-02-25 MED ORDER — ZOLPIDEM TARTRATE 5 MG PO TABS: 5.0000 mg | ORAL_TABLET | Freq: Every evening | ORAL | Status: DC | PRN

## 2018-02-25 MED ORDER — ALBUTEROL SULFATE HFA 108 (90 BASE) MCG/ACT IN AERS
2.0000 | INHALATION_SPRAY | Freq: Four times a day (QID) | RESPIRATORY_TRACT | Status: DC | PRN
  Filled 2018-02-25: qty 6.7

## 2018-02-25 MED ORDER — FLUTICASONE PROPIONATE HFA 44 MCG/ACT IN AERO: 2.0000 | INHALATION_SPRAY | Freq: Two times a day (BID) | RESPIRATORY_TRACT | Status: DC

## 2018-02-25 MED ORDER — BICTEGRAVIR-EMTRICITAB-TENOFOV 50-200-25 MG PO TABS
1.0000 | ORAL_TABLET | Freq: Every day | ORAL | Status: DC
  Administered 2018-02-25 – 2018-02-26 (×2): 1 via ORAL
  Filled 2018-02-25 (×2): qty 1

## 2018-02-25 MED ORDER — HYDROXYZINE HCL 25 MG PO TABS: 50.0000 mg | ORAL_TABLET | Freq: Three times a day (TID) | ORAL | Status: DC | PRN

## 2018-02-25 MED ORDER — ENOXAPARIN SODIUM 40 MG/0.4ML ~~LOC~~ SOLN
40.0000 mg | SUBCUTANEOUS | Status: DC
  Administered 2018-02-25 – 2018-02-26 (×2): 40 mg via SUBCUTANEOUS
  Filled 2018-02-25 (×2): qty 0.4

## 2018-02-25 MED ORDER — HYDRALAZINE HCL 20 MG/ML IJ SOLN: 5.0000 mg | INTRAMUSCULAR | Status: DC | PRN
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
## 2018-02-25 NOTE — ED Notes (Signed)
Attempted to give report. Nurse unavailable. Will call back in 5 minutes

## 2018-02-25 NOTE — ED Triage Notes (Addendum)
Patient states "[place] then the voices told me to take the medication so I took all of them and then I walked here." He reports taking 30 citalopram 1 hour. Patient states he has tried to kill himself multiple times within the last month. "I just want help, the medication is not working. I can still here the voices".   Patient very pleasant and cooperative at this time.

## 2018-02-25 NOTE — ED Triage Notes (Signed)
TC from Poison Control called for update on PT. Tylenol level checked for Poison Control

## 2018-02-25 NOTE — ED Triage Notes (Signed)
Pt reports he took 30 Citalopram  pills about an hour ago. Pt states he was trying to kill himself b/c the meds don't work.  Pt asked for his belongings back so that he could take the "other bottle of pills."
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
## 2018-02-25 NOTE — Progress Notes (Signed)
Patient is admitted this am due to depression and intentional overdose of citalopram.  per intake note He reports taking 30 citalopram 1 hour. Patient states he has tried to kill himself multiple times within the last month. "I just want help, the medication is not working. I can still hearing the voices".   Poison control was consulted. They recommended hospitalization for 24-hour observation to rule out seizure and arrhythmia. QTc 437.  His lab works at baseline, he has h/o HIV  and hep c.  Psychiatry evaluated him today, recommended inpatient psych placement.  By midnight, patient will be 24hrs at midnight tonight, he will be medically cleared to discharge to behavioral health tomorrow. Social worker consulted.

## 2018-02-25 NOTE — ED Notes (Signed)
[facility] Ordered. 
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
## 2018-02-25 NOTE — ED Notes (Signed)
Pt resting, sitter at bedside. Meal tray delivered, but he doesn't want to eat right now. Monitoring for arrhythmia and SZ precautions.

## 2018-02-25 NOTE — ED Notes (Signed)
Spoke with poison control who recommends EKG, 4 hr post ingestion acetaminophen level, and 24 hour observation for EKG changes and seizures.

## 2018-02-26 ENCOUNTER — Encounter (HOSPITAL_COMMUNITY): Payer: Self-pay | Admitting: *Deleted

## 2018-02-26 ENCOUNTER — Other Ambulatory Visit: Payer: Self-pay

## 2018-02-26 ENCOUNTER — Inpatient Hospital Stay (HOSPITAL_COMMUNITY)
Admission: AD | Admit: 2018-02-26 | Discharge: 2018-03-04 | DRG: 885 | Disposition: A | Discharge status: Home / Self Care | Payer: No Typology Code available for payment source | Source: Intra-hospital | Attending: Psychiatry | Admitting: Psychiatry

## 2018-02-26 DIAGNOSIS — F250 Schizoaffective disorder, bipolar type: Principal | ICD-10-CM | POA: Diagnosis present

## 2018-02-26 DIAGNOSIS — I10 Essential (primary) hypertension: Secondary | ICD-10-CM | POA: Diagnosis present

## 2018-02-26 DIAGNOSIS — A0811 Acute gastroenteropathy due to Norwalk agent: Secondary | ICD-10-CM | POA: Diagnosis not present

## 2018-02-26 DIAGNOSIS — R45851 Suicidal ideations: Secondary | ICD-10-CM | POA: Diagnosis present

## 2018-02-26 DIAGNOSIS — Z7951 Long term (current) use of inhaled steroids: Secondary | ICD-10-CM

## 2018-02-26 DIAGNOSIS — A071 Giardiasis [lambliasis]: Secondary | ICD-10-CM

## 2018-02-26 DIAGNOSIS — Z590 Homelessness: Secondary | ICD-10-CM | POA: Diagnosis not present

## 2018-02-26 DIAGNOSIS — Z880 Allergy status to penicillin: Secondary | ICD-10-CM | POA: Diagnosis not present

## 2018-02-26 DIAGNOSIS — Z915 Personal history of self-harm: Secondary | ICD-10-CM | POA: Diagnosis not present

## 2018-02-26 DIAGNOSIS — Z888 Allergy status to other drugs, medicaments and biological substances status: Secondary | ICD-10-CM | POA: Diagnosis not present

## 2018-02-26 DIAGNOSIS — Z91010 Allergy to peanuts: Secondary | ICD-10-CM | POA: Diagnosis not present

## 2018-02-26 DIAGNOSIS — Z818 Family history of other mental and behavioral disorders: Secondary | ICD-10-CM | POA: Diagnosis not present

## 2018-02-26 DIAGNOSIS — J45909 Unspecified asthma, uncomplicated: Secondary | ICD-10-CM | POA: Diagnosis present

## 2018-02-26 DIAGNOSIS — A048 Other specified bacterial intestinal infections: Secondary | ICD-10-CM | POA: Diagnosis not present

## 2018-02-26 DIAGNOSIS — B20 Human immunodeficiency virus [HIV] disease: Secondary | ICD-10-CM | POA: Diagnosis present

## 2018-02-26 DIAGNOSIS — F329 Major depressive disorder, single episode, unspecified: Secondary | ICD-10-CM | POA: Diagnosis present

## 2018-02-26 DIAGNOSIS — F419 Anxiety disorder, unspecified: Secondary | ICD-10-CM | POA: Diagnosis not present

## 2018-02-26 DIAGNOSIS — Z79899 Other long term (current) drug therapy: Secondary | ICD-10-CM | POA: Diagnosis not present

## 2018-02-26 DIAGNOSIS — F259 Schizoaffective disorder, unspecified: Secondary | ICD-10-CM | POA: Diagnosis not present

## 2018-02-26 DIAGNOSIS — T1491XA Suicide attempt, initial encounter: Secondary | ICD-10-CM | POA: Diagnosis not present

## 2018-02-26 DIAGNOSIS — Z21 Asymptomatic human immunodeficiency virus [HIV] infection status: Secondary | ICD-10-CM | POA: Diagnosis not present

## 2018-02-26 DIAGNOSIS — R197 Diarrhea, unspecified: Secondary | ICD-10-CM | POA: Diagnosis not present

## 2018-02-26 DIAGNOSIS — F251 Schizoaffective disorder, depressive type: Secondary | ICD-10-CM | POA: Diagnosis not present

## 2018-02-26 DIAGNOSIS — Z560 Unemployment, unspecified: Secondary | ICD-10-CM | POA: Diagnosis not present

## 2018-02-26 DIAGNOSIS — F17210 Nicotine dependence, cigarettes, uncomplicated: Secondary | ICD-10-CM | POA: Diagnosis not present

## 2018-02-26 DIAGNOSIS — T43222A Poisoning by selective serotonin reuptake inhibitors, intentional self-harm, initial encounter: Secondary | ICD-10-CM | POA: Diagnosis not present

## 2018-02-26 DIAGNOSIS — A079 Protozoal intestinal disease, unspecified: Secondary | ICD-10-CM

## 2018-02-26 DIAGNOSIS — F1099 Alcohol use, unspecified with unspecified alcohol-induced disorder: Secondary | ICD-10-CM | POA: Diagnosis not present

## 2018-02-26 LAB — CBC
HCT: 47 % (ref 39.0–52.0)
HEMOGLOBIN: 15.4 g/dL (ref 13.0–17.0)
MCH: 27.7 pg (ref 26.0–34.0)
MCHC: 32.8 g/dL (ref 30.0–36.0)
MCV: 84.5 fL (ref 78.0–100.0)
Platelets: 256 10*3/uL (ref 150–400)
RBC: 5.56 MIL/uL (ref 4.22–5.81)
RDW: 13.4 % (ref 11.5–15.5)
WBC: 8.6 10*3/uL (ref 4.0–10.5)

## 2018-02-26 LAB — BASIC METABOLIC PANEL
ANION GAP: 9 (ref 5–15)
BUN: 11 mg/dL (ref 6–20)
CALCIUM: 9 mg/dL (ref 8.9–10.3)
CO2: 23 mmol/L (ref 22–32)
Chloride: 104 mmol/L (ref 101–111)
Creatinine, Ser: 0.96 mg/dL (ref 0.61–1.24)
Glucose, Bld: 79 mg/dL (ref 65–99)
POTASSIUM: 3.9 mmol/L (ref 3.5–5.1)
SODIUM: 136 mmol/L (ref 135–145)

## 2018-02-26 MED ORDER — RISPERIDONE 1 MG PO TABS
1.0000 mg | ORAL_TABLET | Freq: Every day | ORAL | Status: DC
  Administered 2018-02-26: 1 mg via ORAL
  Filled 2018-02-26 (×4): qty 1

## 2018-02-26 MED ORDER — HYDROXYZINE HCL 25 MG PO TABS
25.0000 mg | ORAL_TABLET | Freq: Three times a day (TID) | ORAL | Status: DC | PRN
  Filled 2018-02-26: qty 10

## 2018-02-26 MED ORDER — ACETAMINOPHEN 325 MG PO TABS: 650.0000 mg | ORAL_TABLET | Freq: Four times a day (QID) | ORAL | Status: DC | PRN

## 2018-02-26 MED ORDER — TRAZODONE HCL 50 MG PO TABS
50.0000 mg | ORAL_TABLET | Freq: Every evening | ORAL | Status: DC | PRN
  Administered 2018-03-01 – 2018-03-03 (×2): 50 mg via ORAL
  Filled 2018-02-26 (×3): qty 1
  Filled 2018-02-26: qty 7
  Filled 2018-02-26: qty 1

## 2018-02-26 MED ORDER — GABAPENTIN 300 MG PO CAPS
300.0000 mg | ORAL_CAPSULE | Freq: Two times a day (BID) | ORAL | Status: DC
  Administered 2018-02-26 – 2018-03-02 (×8): 300 mg via ORAL
  Filled 2018-02-26: qty 14
  Filled 2018-02-26 (×5): qty 1
  Filled 2018-02-26: qty 14
  Filled 2018-02-26 (×3): qty 1
  Filled 2018-02-26 (×2): qty 14
  Filled 2018-02-26: qty 1

## 2018-02-26 MED ORDER — ALBUTEROL SULFATE HFA 108 (90 BASE) MCG/ACT IN AERS
2.0000 | INHALATION_SPRAY | Freq: Four times a day (QID) | RESPIRATORY_TRACT | Status: DC | PRN
  Administered 2018-02-27 – 2018-03-02 (×2): 2 via RESPIRATORY_TRACT
  Filled 2018-02-26: qty 6.7

## 2018-02-26 MED ORDER — BUDESONIDE 0.25 MG/2ML IN SUSP
0.2500 mg | Freq: Two times a day (BID) | RESPIRATORY_TRACT | Status: DC
  Filled 2018-02-26 (×14): qty 2

## 2018-02-26 MED ORDER — BENZTROPINE MESYLATE 0.5 MG PO TABS
0.5000 mg | ORAL_TABLET | Freq: Every day | ORAL | Status: DC
  Administered 2018-02-26 – 2018-03-03 (×4): 0.5 mg via ORAL
  Filled 2018-02-26: qty 1
  Filled 2018-02-26: qty 7
  Filled 2018-02-26 (×2): qty 1
  Filled 2018-02-26 (×2): qty 7
  Filled 2018-02-26 (×2): qty 1
  Filled 2018-02-26: qty 7

## 2018-02-26 NOTE — Progress Notes (Signed)
Edwin Martinez is a 32 year old male pt admitted on voluntary basis from medical floor. He reports that he took overdose of his medications and spoke about on-going auditory hallucinations that was the precipaiating factor for his overdose. He spoke about how he has been taking his medications but reports that he knows that it's been over a month since he received his Invega sustena injection. He denies any substance abuse issues and reports he only drinks a couple beers a week. He reports that he is homeless and unsure of where he will go once he is discharged and reports that he will probably go back to the streets. Edwin Martinez denies any SI on admission and is able to contract for safety while in the hospital. He was oriented to the unit and safety maintained.

## 2018-02-26 NOTE — Tx Team (Signed)
Initial Treatment Plan 02/26/2018 5:58 PM Edwin HorsemanEdward Allen Martinez ZOX:[id number]    PATIENT STRESSORS: Financial difficulties Health problems Medication change or noncompliance   PATIENT STRENGTHS: Ability for insight Average or above average intelligence Capable of independent living General fund of knowledge   PATIENT IDENTIFIED PROBLEMS: Depression Suicidal thoughts Homelessness Auditory hallucinations "Need a different anti-depressant and have been hearing voices"                     DISCHARGE CRITERIA:  Ability to meet basic life and health needs Improved stabilization in mood, thinking, and/or behavior Reduction of life-threatening or endangering symptoms to within safe limits Verbal commitment to aftercare and medication compliance  PRELIMINARY DISCHARGE PLAN: Attend aftercare/continuing care group  PATIENT/FAMILY INVOLVEMENT: This treatment plan has been presented to and reviewed with the patient, Edwin Horsemandward Allen Martinez, and/or family member, .  The patient and family have been given the opportunity to ask questions and make suggestions.  Edwin Martinez, SosoBrook Martinez, [place]RN 02/26/2018, 5:58 PM

## 2018-02-26 NOTE — Progress Notes (Signed)
Per [facility]. Accepting provider is Shuvon Rankin. Attending provider is Dr. Jola Babinskilary. Patient can arrive by 3:30PM. Number for report is [phone]. LCSW has contacted the patient's nurse of the placement information.   Moss McKy-Sha Skyra Crichlow MSW, LCSW-A, LCAS-A Clinical Social Worker 02/26/2018 3:15 PM

## 2018-02-26 NOTE — Progress Notes (Signed)
Report given to Avery DennisonBrooke [facility] at [facility].

## 2018-02-26 NOTE — Clinical Social Work Note (Signed)
CS[facility] to [facility]. Pt has signed Voluntary Admission and Consent for treatment form. [facility] and is agreeable to go there.   Van DyneBridget Devlon Dosher, [place] [phone]
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
## 2018-02-26 NOTE — Progress Notes (Signed)
Pt arrived to the floor alert and oriented x4. CCMD verified. Refer to flowsheet for skin assessment. IV in the left forearm. Orders are being implemented. Call light is with in reach. Will continue to monitor.

## 2018-02-26 NOTE — Progress Notes (Signed)
Pt did not attend group this evening. Pt stayed in room.

## 2018-02-26 NOTE — Clinical Social Work Note (Signed)
[facility] will accept the pt today. Accepting MD will be Clary. Accepting provider will be Sheuvon Rankin. Room number 401, bed 1. RN can call report to [phone]. Pick up time 3:30.  Clinical Social Worker will sign off for now as social work intervention is no longer needed. Please consult us again if new need arises.   West HillsBridget Alexey Rhoads, [place]WA [phone]

## 2018-02-27 DIAGNOSIS — Z79899 Other long term (current) drug therapy: Secondary | ICD-10-CM

## 2018-02-27 DIAGNOSIS — Z560 Unemployment, unspecified: Secondary | ICD-10-CM

## 2018-02-27 DIAGNOSIS — F259 Schizoaffective disorder, unspecified: Secondary | ICD-10-CM

## 2018-02-27 DIAGNOSIS — Z590 Homelessness: Secondary | ICD-10-CM

## 2018-02-27 DIAGNOSIS — T43222A Poisoning by selective serotonin reuptake inhibitors, intentional self-harm, initial encounter: Secondary | ICD-10-CM

## 2018-02-27 DIAGNOSIS — T1491XA Suicide attempt, initial encounter: Secondary | ICD-10-CM

## 2018-02-27 DIAGNOSIS — Z915 Personal history of self-harm: Secondary | ICD-10-CM

## 2018-02-27 DIAGNOSIS — F17210 Nicotine dependence, cigarettes, uncomplicated: Secondary | ICD-10-CM

## 2018-02-27 LAB — TSH: TSH: 0.686 u[IU]/mL (ref 0.350–4.500)

## 2018-02-27 MED ORDER — RISPERIDONE 1 MG PO TABS
1.0000 mg | ORAL_TABLET | Freq: Two times a day (BID) | ORAL | Status: DC
  Administered 2018-02-27 – 2018-03-04 (×10): 1 mg via ORAL
  Filled 2018-02-27: qty 14
  Filled 2018-02-27: qty 1
  Filled 2018-02-27 (×3): qty 14
  Filled 2018-02-27: qty 1
  Filled 2018-02-27 (×2): qty 14
  Filled 2018-02-27 (×3): qty 1
  Filled 2018-02-27: qty 14
  Filled 2018-02-27: qty 1
  Filled 2018-02-27: qty 14

## 2018-02-27 MED ORDER — LORATADINE 10 MG PO TABS
10.0000 mg | ORAL_TABLET | Freq: Every day | ORAL | Status: DC
  Administered 2018-02-27 – 2018-03-04 (×6): 10 mg via ORAL
  Filled 2018-02-27: qty 1
  Filled 2018-02-27: qty 7
  Filled 2018-02-27: qty 1
  Filled 2018-02-27 (×2): qty 7
  Filled 2018-02-27 (×3): qty 1
  Filled 2018-02-27: qty 7

## 2018-02-27 NOTE — Plan of Care (Signed)
Patient self inventory: Patient slept fair last night, sleep medication was requested and helped. Appetite has been good, energy level normal, concentration good. Depression , hopelessness, and anxiety are rated 7, 5, 7 out of 10. Patient denies physical problems and denies physical pain. Patient's goal is to "get my meds right." Patient pleasant and cooperative. Patient compliant with medication administration. Safety maintained with 15 minute checks.   Problem: Education: Goal: Emotional status will improve Outcome: Progressing

## 2018-02-27 NOTE — Progress Notes (Addendum)
D:  Edwin Martinez has been in his room all evening.  He did not attend evening wrap up group.  RN had to go to his room to give him his hs medications.  He denies SI/HI but continues to report hearing voices but they "come and go."  He denies any pain or discomfort and appeared to be in no physical distress.  Attempted to give him his 8pm nebulizer but he declined stating he doesn't take that at home and only uses inhaler prn.   A:  1:1 with RN for support and encouragement.  Medications as ordered.  Q 15 minute checks maintained for safety.  Encouraged participation in group and unit activities. R:  Edwin Martinez remains safe on the unit.  We will continue to monitor the progress towards his goals.
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
## 2018-02-27 NOTE — Plan of Care (Signed)
Edwin Martinez reports that he is tired and wants to sleep.  He has been in his room all evening.  He did not attend evening wrap up group.  Encouraged him to try and attend groups tomorrow.
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
## 2018-02-27 NOTE — [facility] Suicide Risk Assessment (Addendum)
[facility]   Nursing information obtained from:  Patient Demographic factors:  Male, Cardell PeachGay, lesbian, or bisexual orientation, Low socioeconomic status, Living alone, Unemployed Current Mental Status:  Suicidal ideation indicated by patient, Suicide plan, Plan includes specific time, place, or method, Self-harm thoughts Loss Factors:  Financial problems / change in socioeconomic status Historical Factors:  Prior suicide attempts, Family history of mental illness or substance abuse Risk Reduction Factors:  Positive coping skills or problem solving skills  Total Time spent with patient: 45 minutes Principal Problem: Schizoaffective Disorder  Diagnosis:   Patient Active Problem List   Diagnosis Date Noted  . MDD (major depressive disorder) [F32.9] 02/26/2018  . Suicide attempt (HCC) [T14.91XA]   . Depression [F32.9] 11/08/2017  . Chest tightness [R07.89]   . Cough [R05]   . Leukocytosis [D72.829]   . SOB (shortness of breath) [R06.02]   . Nausea vomiting and diarrhea [R11.2, R19.7]   . Asthma exacerbation [J45.901] 08/15/2017  . Elevated LFTs [R94.5] 08/12/2017  . 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 Herpes zoster [B02.9] 03/06/2017  . Polysubstance abuse (HCC) [F19.10] 03/06/2017  . Homelessness [Z59.0] 03/06/2017  . Schizoaffective disorder, bipolar type (HCC) [F25.0] 12/23/2016  . Suicidal ideation [R45.851]   . Cannabis use disorder, moderate, dependence (HCC) [F12.20] 07/27/2016  . Tobacco user [Z72.0] 07/27/2016  . Intentional drug overdose (HCC) [T50.902A] 07/18/2016  . Asthma [J45.909] 05/20/2007  . HIV (human immunodeficiency virus infection) (HCC) [B20] 05/05/2007   Subjective Data:   Continued Clinical Symptoms:  Alcohol Use Disorder Identification Test Final Score (AUDIT): 3 The "Alcohol Use Disorders Identification Test", Guidelines for Use in Primary Care, Second Edition.  World Science writerHealth Organization  [facility](WHO). Score between 0-7:  no or low risk or alcohol related problems. Score between 8-15:  moderate risk of alcohol related problems. Score between 16-19:  high risk of alcohol related problems. Score 20 or above:  warrants further diagnostic evaluation for alcohol dependence and treatment.   CLINICAL FACTORS:  32 year old male, single, no children, homeless , unemployed.  Presented to the ED voluntarily reporting suicide attempt by overdosing on Celexa . States he took 2630.  States he has been feeling more depressed over recent days to weeks, but states he did not have suicidal ideations up to day of admission. States that " voices told me to take the medication".  Reports he has history of intermittent auditory hallucinations, and states " they come and go a few times a month". He states he had been hearing voices prior to admission and  Of note , does not endorse significant neuro-vegetative symptoms. Denies changes in sleep, appetite, energy level or major anhedonia. States he drinks occasionally and denies pattern of regular alcohol consumption, denies drug abuse. Admission BAL 129, admission UDS negative. Currently not presenting with symptoms of ETOH WDL.  History of several prior psychiatric admissions, reports he has been diagnosed with " Bipolar Disorder and Schizophrenia". History of prior suicidal attempts. Of note, [facility] similar presentation as above - depression, SI, hallucinations.  Denies any alcohol or drug abuse .   Medical history is remarkable for HIV, Hep C(+) . Takes Biktarvy - follows up at local ID clinic. Also states he has been told he may have Huntington's Disease as father and other family member had disease.    Current medications include Invega Sustenna 234 mgrs Q month- states he does not remember when his last dose was but  states it has been " a few weeks", Risperidone 1 mgr BID.  Of note, states he has been on Risperidone  "  for years " and that it has been prescribed in conjunction with Gean Birchwood , denies  side effects or drug drug interactions  Dx- Consider Schizoaffective Disorder  Plan- Inpatient admission. Patient states he feels Hinda Glatter has been helpful and well tolerated  Will work on confirming when last [facility] IM dose was given. Continue Neurontin 300 mgrs BID for neuropathic pain and anxiety Increase Risperidone to 1 mgr BID *Patient's Allergy list indicates allergy to Biktarvy component, but he states he has been taking without incident. Will review with pharmacist .         Musculoskeletal: Strength & Muscle Tone: within normal limits Gait & Station: normal Patient leans: N/A  Psychiatric Specialty Exam: Physical Exam  ROS no headache, no chest pain, no shortness of breath, no nausea, no  vomiting, no diarrhea, no fever or chills   Blood pressure 103/72, pulse 64, temperature 97.6 F (36.4 C), temperature source Oral, resp. rate 18, height 5\' 7"  (1.702 m), weight 74.8 kg (165 lb).Body mass index is 25.84 kg/m.  General Appearance: Fairly Groomed  Eye Contact:  Good  Speech:  Normal Rate  Volume:  Normal  Mood:  reports mood improved now that he is in hospital , describes as 7/10  Affect:  mildly constricted, but improves during session, smiles at times appropriately   Thought Process:  Linear and Descriptions of Associations: Intact  Orientation:  Full (Time, Place, and Person)  Thought Content:  reports frequent hallucinations, but states he has had none today and does not appear internally preoccupied, no delusions expressed   Suicidal Thoughts:  No denies current suicidal or self injurious ideations, denies homicidal or violent ideations   Homicidal Thoughts:  No  Memory:  recent and remote grossly intact   Judgement:  Fair  Insight:  Fair  Psychomotor Activity:  Normal  Concentration:  Concentration: Good and Attention Span: Good  Recall:  Good  Fund of Knowledge:   Good  Language:  Good  Akathisia:  Negative  Handed:  Right  AIMS (if indicated):     Assets:  Communication Skills Desire for Improvement Resilience  ADL's:  Intact  Cognition:  WNL  Sleep:  Number of Hours: 6.5      COGNITIVE FEATURES THAT CONTRIBUTE TO RISK:  Closed-mindedness and Loss of executive function    SUICIDE RISK:   Moderate:  Frequent suicidal ideation with limited intensity, and duration, some specificity in terms of plans, no associated intent, good self-control, limited dysphoria/symptomatology, some risk factors present, and identifiable protective factors, including available and accessible social support.  PLAN OF CARE: Patient will be admitted to inpatient psychiatric unit for stabilization and safety. Will provide and encourage milieu participation. Provide medication management and maked adjustments as needed.  Will follow daily.    I certify that inpatient services furnished can reasonably be expected to improve the patient's condition.   Craige Cotta, MD 02/27/2018, 2:15 PM

## 2018-02-27 NOTE — BHH Group Notes (Signed)
Identifying  Needs   Date:  02/27/2018  Time:  2:56 PM  Type of Therapy:  Nurse Education  /  The group focuses on teaching patients how to identify their needs and then how to develop healthier coping skills to enable them to get some of those needs met.  Participation Level:  Active  Participation Quality:  Attentive  Affect:  Appropriate  Cognitive:  Appropriate  Insight:  Appropriate  Engagement in Group:  Engaged  Modes of Intervention:  Education  Summary of Progress/Problems:  Edwin Martinez 02/27/2018, 2:56 PM

## 2018-02-27 NOTE — BHH Group Notes (Signed)
BHH Group Notes: (Clinical Social Work)   02/27/2018      Type of Therapy:  Group Therapy   Participation Level:  Did Not Attend despite MHT prompting   Shellia CleverlyStephanie N Meko Masterson, LCSW  02/27/2018 11:49 AM

## 2018-02-27 NOTE — Progress Notes (Signed)
Pt did not attend group this evening. Pt stayed in his room.

## 2018-02-28 DIAGNOSIS — Z818 Family history of other mental and behavioral disorders: Secondary | ICD-10-CM

## 2018-02-28 NOTE — Progress Notes (Signed)
[facility] MD Progress Note  02/28/2018 4:01 PM Edwin Martinez  MRN:  [id number] Subjective: Patient reports he is feeling better today.  Today denies suicidal ideations, describes improving mood.  Thus far denies medication side effects. Objective : I have discussed case with treatment team and have met with patient. 32 year old male , presented to the emergency room voluntarily reporting overdose on Celexa.  Reports history of intermittent auditory hallucinations, including command hallucinations. Admission BAL 129/  denies pattern of alcohol abuse or dependence.  History of prior psychiatric admissions, prior diagnoses of Schizoaffective Disorder. HIV (+)  Patient reports he is feeling better today, and presents with a fuller range , brighter affect.  Denies current suicidal ideations.  Endorses intermittent hallucinations but currently not internally preoccupied.  No delusions expressed.  No thought disorder noted.  Behavior on unit in good control, polite on approach, limited group participation thus far. Denies medication side effects.  Interested in continuing  [place] IM ( depot), is unsure when he last received this injection.   .principal Problem: Schizoaffective disorder ([place]) Diagnosis:   Patient Active Problem List   Diagnosis Date Noted  . MDD (major depressive disorder) [F32.9] 02/26/2018  . Suicide attempt ([place]) [T14.91XA]   . Depression [F32.9] 11/08/2017  . Chest tightness [R07.89]   . Cough [R05]   . Leukocytosis [D72.829]   . SOB (shortness of breath) [R06.02]   . Nausea vomiting and diarrhea [R11.2, R19.7]   . Asthma exacerbation [J45.901] 08/15/2017  . Elevated LFTs [R94.5] 08/12/2017  . Cocaine abuse with cocaine-induced mood disorder ([place]) [F14.14] 03/30/2017  . Herpes zoster [B02.9] 03/06/2017  . Polysubstance abuse ([place]) [F19.10] 03/06/2017  . Homelessness [Z59.0] 03/06/2017  . Schizoaffective disorder ([place]) [F25.9] 12/23/2016  . Suicidal ideation  [R45.851]   . Cannabis use disorder, moderate, dependence ([place]) [F12.20] 07/27/2016  . Tobacco user [Z72.0] 07/27/2016  . Intentional drug overdose ([place]) [T50.902A] 07/18/2016  . Asthma [J45.909] 05/20/2007  . HIV (human immunodeficiency virus infection) ([place]) [B20] 05/05/2007   Total Time spent with patient: 20 minutes    Past Medical History:  Past Medical History:  Diagnosis Date  . ADD (attention deficit disorder)   . Anxiety   . Asthma   . Bipolar 1 disorder ([place])   . Depression   . HIV (human immunodeficiency virus infection) ([place]) dx'd 2008  . Hypertension   . Insomnia   . Intentional drug overdose ([place])    Archie Endo 02/25/2018  . Polysubstance abuse ([place])    Archie Endo 02/25/2018  . Schizophrenia ([place])   . Seizures ([place])    "used to have little black-out szs where I'd drop out for 2-3 min then come back; nothing in the last 2-3-4years" (02/25/2018)  . Shingles     Past Surgical History:  Procedure Laterality Date  . DENTAL SURGERY     "had my eye teeth pulled down"   Family History:  Family History  Problem Relation Age of Onset  . Huntington's disease Father   . Heart disease Mother   . Suicidality Maternal Uncle   . Suicidality Maternal Grandmother     Social History:  Social History   Substance and Sexual Activity  Alcohol Use Yes  . Alcohol/week: 2.4 oz  . Types: 4 Cans of beer per week     Social History   Substance and Sexual Activity  Drug Use Yes  . Types: Marijuana, Cocaine   Comment: 02/25/2018 "nothing in years"    Social History   Socioeconomic History  . Marital  status: Single    Spouse name: Not on file  . Number of children: Not on file  . Years of education: Not on file  . Highest education level: Not on file  Occupational History  . Occupation: disability pending  Social Needs  . Financial resource strain: Not on file  . Food insecurity:    Worry: Not on file    Inability: Not on file  . Transportation needs:    Medical: Not on  file    Non-medical: Not on file  Tobacco Use  . Smoking status: Current Every Day Smoker    Packs/day: 0.50    Years: 20.00    Pack years: 10.00    Types: Cigarettes    Start date: 09/29/1991  . Smokeless tobacco: Never Used  Substance and Sexual Activity  . Alcohol use: Yes    Alcohol/week: 2.4 oz    Types: 4 Cans of beer per week  . Drug use: Yes    Types: Marijuana, Cocaine    Comment: 02/25/2018 "nothing in years"  . Sexual activity: Yes    Birth control/protection: Condom  Lifestyle  . Physical activity:    Days per week: Not on file    Minutes per session: Not on file  . Stress: Not on file  Relationships  . Social connections:    Talks on phone: Not on file    Gets together: Not on file    Attends religious service: Not on file    Active member of club or organization: Not on file    Attends meetings of clubs or organizations: Not on file    Relationship status: Not on file  Other Topics Concern  . Not on file  Social History Narrative   ** Merged History Encounter **       ** Merged History Encounter **       Additional Social History:   Sleep: Good  Appetite:  Good  Current Medications: Current Facility-Administered Medications  Medication Dose Route Frequency Provider Last Rate Last Dose  . albuterol (PROVENTIL HFA;VENTOLIN HFA) 108 (90 Base) MCG/ACT inhaler 2 puff  2 puff Inhalation Q6H PRN Derrill Center, NP   2 puff at 02/27/18 [id number]  . benztropine (COGENTIN) tablet 0.5 mg  0.5 mg Oral QHS Derrill Center, NP   0.5 mg at 02/26/18 2239  . budesonide (PULMICORT) nebulizer solution 0.25 mg  0.25 mg Nebulization BID Derrill Center, NP      . gabapentin (NEURONTIN) capsule 300 mg  300 mg Oral BID Derrill Center, NP   300 mg at 02/28/18 3428  . hydrOXYzine (ATARAX/VISTARIL) tablet 25 mg  25 mg Oral TID PRN Derrill Center, NP      . loratadine (CLARITIN) tablet 10 mg  10 mg Oral Daily Derrill Center, NP   10 mg at 02/28/18 7681  . risperiDONE (RISPERDAL)  tablet 1 mg  1 mg Oral BID Falynn Ailey, Myer Peer, MD   1 mg at 02/28/18 1572  . traZODone (DESYREL) tablet 50 mg  50 mg Oral QHS PRN Derrill Center, NP        Lab Results:  Results for orders placed or performed during the hospital encounter of 02/26/18 (from the past 48 hour(s))  TSH     Status: None   Collection Time: 02/27/18  5:55 AM  Result Value Ref Range   TSH 0.686 0.350 - 4.500 uIU/mL    Comment: Performed by a 3rd Generation assay with a functional sensitivity  of <=0.01 uIU/mL. Performed at [facility], [place] [address] [place], [place] [address]     Blood Alcohol level:  Lab Results  Component Value Date   ETH 129 (H) 02/25/2018   ETH <10 70/35/0093    Metabolic Disorder Labs: Lab Results  Component Value Date   HGBA1C 5.6 12/24/2016   MPG 114 12/24/2016   MPG 111 05/04/2016   Lab Results  Component Value Date   PROLACTIN 28.8 (H) 12/24/2016   PROLACTIN 32.3 (H) 08/19/2016   Lab Results  Component Value Date   CHOL 128 12/24/2016   TRIG 130 12/24/2016   HDL 35 (L) 12/24/2016   CHOLHDL 3.7 12/24/2016   VLDL 26 12/24/2016   LDLCALC 67 12/24/2016   LDLCALC 75 08/19/2016    Physical Findings: AIMS: Facial and Oral Movements Muscles of Facial Expression: None, normal Lips and Perioral Area: None, normal Jaw: None, normal Tongue: None, normal,Extremity Movements Upper (arms, wrists, hands, fingers): None, normal Lower (legs, knees, ankles, toes): None, normal, Trunk Movements Neck, shoulders, hips: None, normal, Overall Severity Severity of abnormal movements (highest score from questions above): None, normal Incapacitation due to abnormal movements: None, normal Patient's awareness of abnormal movements (rate only patient's report): No Awareness, Dental Status Current problems with teeth and/or dentures?: No Does patient usually wear dentures?: No  CIWA:    COWS:     Musculoskeletal: Strength & Muscle Tone: within normal limits no  current symptoms of alcohol withdrawal, no tremors, no diaphoresis, no facial flushing, no restlessness, vital signs are stable. Gait & Station: normal Patient leans: N/A  Psychiatric Specialty Exam: Physical Exam  ROS denies headache, no chest pain, no shortness of breath, no nausea, no vomiting, no fever  Blood pressure 118/84, pulse 84, temperature 97.7 F (36.5 C), temperature source Oral, resp. rate 18, height 5' 7"  (1.702 m), weight 74.8 kg (165 lb).Body mass index is 25.84 kg/m.  General Appearance: Fairly Groomed  Eye Contact:  Good  Speech:  Normal Rate  Volume:  Normal  Mood:  Improved-states he feels better  Affect:  Reactive and smiles appropriately during session  Thought Process:  Linear and Descriptions of Associations: Intact  Orientation:  Other:  Fully alert and attentive  Thought Content:  No hallucinations at present and does not appear internally preoccupied, no delusions expressed  Does report history of intermittent auditory hallucinations  Suicidal Thoughts:  No currently denies suicidal ideations, denies self-injurious ideations, contracts for safety on unit, denies homicidal or violent ideations  Homicidal Thoughts:  No  Memory:  Recent and remote grossly intact  Judgement: Improving  Insight:  Fair  Psychomotor Activity:  Normal  Concentration:  Concentration: Fair and Attention Span: Fair  Recall:  Good  Fund of Knowledge:  Good  Language:  Good  Akathisia:  Negative  Handed:  Right  AIMS (if indicated):     Assets:  Desire for Improvement Resilience  ADL's:  Intact  Cognition:  WNL  Sleep:  Number of Hours: 6.5   Assessment -patient presents with improving mood and range of affect.  Currently denies suicidal ideations.  At this time not presenting with active symptoms of psychosis (no current hallucinations, no delusions, no thought disorder), but reports history of intermittent auditory hallucinations.  States he has done well on [place] IM  depot , states he last received this medication a few weeks ago but is unsure when.   Treatment Plan Summary: Daily contact with patient to assess and evaluate symptoms and progress  in treatment, Medication management, Plan inpatient admission and medications as below Encourage group and milieu participation to work on coping skills and symptom reduction Continue Risperidone 1 mg BID for psychosis, mood disorder Continue Neurontin 300 mgrs BID for anxiety, pain Continue Trazodone 50 mgrs QHS PRN for insomnia as needed  Continue Cogentin 0.5 mgrs QDAY to minimize risk of EPS/ neuroleptic side effects. Continue Vistaril 25 mgrs Q 8 hours PRN for anxiety as needed  Treatment team working on disposition planning options  Treatment team working on determining when patient's last [place] dose was in order to determine when he can receive it again.   Jenne Campus, MD 02/28/2018, 4:01 PM

## 2018-02-28 NOTE — Plan of Care (Signed)
Patient verbalizes understanding of information, education provided. 

## 2018-02-28 NOTE — Progress Notes (Signed)
D: Patient rested throughout the night and was asleep before this writer was able to assess (due to the arrival of an admit.)  This morning patient observed sitting in dayroom. Pleasant and smiling on approach. Patient denies AVH/SI/HI at this time but then goes on to say, "they (the voices) come and go." Confirms that when he hears the voices they are command in nature. He verbally contracts for safety. His presentation is inconsistent with his report. No evidence of internal stimuli as patient is observed interacting with peers and staff appropriately. Appears relaxed and is focused/not distracted during interactions. Denies pain, physical complaints.   A: No meds ordered at this time and patient denies need for any prn medications. Level III obs in place for safety. Emotional support offered. Encouraged to attend groups today. Fall prevention plan in place and reviewed with patient as pt is a high fall risk due to hx of seizure disorder.   R: Patient verbalizes understanding of POC, falls prevention education. Patient remains safe on level III obs. Will continue to monitor.

## 2018-02-28 NOTE — Tx Team (Signed)
Interdisciplinary Treatment and Diagnostic Plan Update  02/28/2018 Time of Session: 9:30am Edwin Martinez MRN: [id number]  Principal Diagnosis: Schizoaffective disorder [facility])  Secondary Diagnoses: Principal Problem:   Schizoaffective disorder ([place]) Active Problems:   MDD (major depressive disorder)   Current Medications:  Current Facility-Administered Medications  Medication Dose Route Frequency Provider Last Rate Last Dose  . albuterol (PROVENTIL HFA;VENTOLIN HFA) 108 (90 Base) MCG/ACT inhaler 2 puff  2 puff Inhalation Q6H PRN Derrill Center, NP   2 puff at 02/27/18 [phone]  . benztropine (COGENTIN) tablet 0.5 mg  0.5 mg Oral QHS Derrill Center, NP   0.5 mg at 02/26/18 2239  . budesonide (PULMICORT) nebulizer solution 0.25 mg  0.25 mg Nebulization BID Derrill Center, NP      . gabapentin (NEURONTIN) capsule 300 mg  300 mg Oral BID Derrill Center, NP   300 mg at 02/28/18 2595  . hydrOXYzine (ATARAX/VISTARIL) tablet 25 mg  25 mg Oral TID PRN Derrill Center, NP      . loratadine (CLARITIN) tablet 10 mg  10 mg Oral Daily Derrill Center, NP   10 mg at 02/28/18 6387  . risperiDONE (RISPERDAL) tablet 1 mg  1 mg Oral BID Cobos, Myer Peer, MD   1 mg at 02/28/18 5643  . traZODone (DESYREL) tablet 50 mg  50 mg Oral QHS PRN Derrill Center, NP       PTA Medications: Medications Prior to Admission  Medication Sig Dispense Refill Last Dose  . albuterol (PROVENTIL HFA;VENTOLIN HFA) 108 (90 Base) MCG/ACT inhaler Inhale 2 puffs into the lungs every 6 (six) hours as needed for wheezing or shortness of breath. 1 Inhaler 2 02/24/2018 at Unknown time  . bictegravir-emtricitabine-tenofovir AF (BIKTARVY) 50-200-25 MG TABS tablet Take 1 tablet by mouth daily. 30 tablet 2 02/24/2018 at Unknown time  . fluticasone (FLOVENT HFA) 44 MCG/ACT inhaler Inhale 2 puffs into the lungs 2 (two) times daily. (Patient taking differently: Inhale 2 puffs into the lungs 2 (two) times daily as needed (SOB). ) 1 Inhaler 2  02/24/2018 at Unknown time  . gabapentin (NEURONTIN) 300 MG capsule Take 1 capsule (300 mg total) by mouth 3 (three) times daily. 90 capsule 0 02/24/2018 at Unknown time  . hydrOXYzine (ATARAX/VISTARIL) 50 MG tablet Take 1 tablet (50 mg total) by mouth 3 (three) times daily as needed for anxiety. 90 tablet 0 02/24/2018 at Unknown time  . paliperidone (INVEGA SUSTENNA) 234 MG/1.5ML SUSP injection Inject 234 mg every 28 (twenty-eight) days into the muscle. 0.9 mL 1 Past Month at Unknown time  . risperiDONE (RISPERDAL) 1 MG tablet Take 1 mg by mouth 2 (two) times daily.  2 02/24/2018 at Unknown time    Patient Stressors: Financial difficulties Health problems Medication change or noncompliance  Patient Strengths: Ability for insight Average or above average intelligence Capable of independent living General fund of knowledge  Treatment Modalities: Medication Management, Group therapy, Case management,  1 to 1 session with clinician, Psychoeducation, Recreational therapy.   Physician Treatment Plan for Primary Diagnosis: Schizoaffective disorder ([place]) Long Term Goal(s): Improvement in symptoms so as ready for discharge Improvement in symptoms so as ready for discharge   Short Term Goals: Ability to identify changes in lifestyle to reduce recurrence of condition will improve Ability to verbalize feelings will improve Ability to disclose and discuss suicidal ideas Ability to demonstrate self-control will improve Ability to identify and develop effective coping behaviors will improve Ability to maintain clinical measurements within normal  limits will improve Compliance with prescribed medications will improve  Medication Management: Evaluate patient's response, side effects, and tolerance of medication regimen.  Therapeutic Interventions: 1 to 1 sessions, Unit Group sessions and Medication administration.  Evaluation of Outcomes: Not Met  Physician Treatment Plan for Secondary Diagnosis:  Principal Problem:   Schizoaffective disorder ([place]) Active Problems:   MDD (major depressive disorder)  Long Term Goal(s): Improvement in symptoms so as ready for discharge Improvement in symptoms so as ready for discharge   Short Term Goals: Ability to identify changes in lifestyle to reduce recurrence of condition will improve Ability to verbalize feelings will improve Ability to disclose and discuss suicidal ideas Ability to demonstrate self-control will improve Ability to identify and develop effective coping behaviors will improve Ability to maintain clinical measurements within normal limits will improve Compliance with prescribed medications will improve     Medication Management: Evaluate patient's response, side effects, and tolerance of medication regimen.  Therapeutic Interventions: 1 to 1 sessions, Unit Group sessions and Medication administration.  Evaluation of Outcomes: Not Met   RN Treatment Plan for Primary Diagnosis: Schizoaffective disorder ([place]) Long Term Goal(s): Knowledge of disease and therapeutic regimen to maintain health will improve  Short Term Goals: Ability to remain free from injury will improve, Ability to verbalize frustration and anger appropriately will improve, Ability to verbalize feelings will improve, Ability to disclose and discuss suicidal ideas and Compliance with prescribed medications will improve  Medication Management: RN will administer medications as ordered by provider, will assess and evaluate patient's response and provide education to patient for prescribed medication. RN will report any adverse and/or side effects to prescribing provider.  Therapeutic Interventions: 1 on 1 counseling sessions, Psychoeducation, Medication administration, Evaluate responses to treatment, Monitor vital signs and CBGs as ordered, Perform/monitor CIWA, COWS, AIMS and Fall Risk screenings as ordered, Perform wound care treatments as ordered.  Evaluation of  Outcomes: Not Met   LCSW Treatment Plan for Primary Diagnosis: Schizoaffective disorder ([place]) Long Term Goal(s): Safe transition to appropriate next level of care at discharge, Engage patient in therapeutic group addressing interpersonal concerns.  Short Term Goals: Engage patient in aftercare planning with referrals and resources, Increase social support, Increase ability to appropriately verbalize feelings, Increase emotional regulation, Facilitate acceptance of mental health diagnosis and concerns, Facilitate patient progression through stages of change regarding substance use diagnoses and concerns, Identify triggers associated with mental health/substance abuse issues and Increase skills for wellness and recovery  Therapeutic Interventions: Assess for all discharge needs, 1 to 1 time with Social worker, Explore available resources and support systems, Assess for adequacy in community support network, Educate family and significant other(s) on suicide prevention, Complete Psychosocial Assessment, Interpersonal group therapy.  Evaluation of Outcomes: Not Met   Progress in Treatment: Attending groups: No. Participating in groups: No. Taking medication as prescribed: Yes. Toleration medication: Yes. Family/Significant other contact made: No, will contact:  patient refused consent for collateral contacts Patient understands diagnosis: Yes. Discussing patient identified problems/goals with staff: Yes. Medical problems stabilized or resolved: Yes. Denies suicidal/homicidal ideation: Yes. Issues/concerns per patient self-inventory: No. Other:   New problem(s) identified: None  New Short Term/Long Term Goal(s):Detox, medication stabilization, elimination of SI thoughts, development of comprehensive mental wellness plan.    Patient Goals:  Patient did not participate. CSW will obtain goal next date treatment team notes are due.   Discharge Plan or Barriers: CSW will continue to assess for  an appropriate discharge plan.   Reason for Continuation of Hospitalization:  Hallucinations Medication stabilization Suicidal ideation  Estimated Length of Stay: Friday, 03/04/18  Attendees: Patient:  02/28/2018 8:40 AM  Physician: Dr. Neita Garnet, MD 02/28/2018 8:40 AM  Nursing: Corky Mull, RN 02/28/2018 8:40 AM  RN Care Manager: Rhunette Croft 02/28/2018 8:40 AM  Social Worker: Radonna Ricker, [place] 02/28/2018 8:40 AM  Recreational Therapist: Rhunette Croft 02/28/2018 8:40 AM  Other: X 02/28/2018 8:40 AM  Other: X 02/28/2018 8:40 AM  Other:X 02/28/2018 8:40 AM    Scribe for Treatment Team: Marylee Floras, [place] 02/28/2018 8:40 AM

## 2018-02-28 NOTE — Progress Notes (Signed)
Adult Psychoeducational Group Note  Date:  02/28/2018 Time:  1600 Group Topic/Focus:  Dimensions of Wellness:   The focus of this group is to introduce the topic of wellness and discuss the role each dimension of wellness plays in total health.  Participation Level:  Active  Participation Quality:  Appropriate, Attentive, Sharing and Supportive  Affect:  Appropriate  Cognitive:  Alert, Appropriate and Oriented  Insight: Improving  Engagement in Group:  Developing/Improving  Modes of Intervention:  Discussion, Education, Exploration, Socialization and Support  Additional Comments:  Pt. Active participant.  Shared how he remains motivated and how important his faith his to him.   Delila PereyraMichels, Cydnee Fuquay Louise 02/28/2018, 5:59 PM

## 2018-02-28 NOTE — [facility] Group Notes (Signed)
[facility] LCSW Group Therapy Note  Date/Time: 02/28/18, 1315  Type of Therapy and Topic:  Group Therapy:  Overcoming Obstacles  Participation Level:  moderate  Description of Group:    In this group patients will be encouraged to explore what they see as obstacles to their own wellness and recovery. They will be guided to discuss their thoughts, feelings, and behaviors related to these obstacles. The group will process together ways to cope with barriers, with attention given to specific choices patients can make. Each patient will be challenged to identify changes they are motivated to make in order to overcome their obstacles. This group will be process-oriented, with patients participating in exploration of their own experiences as well as giving and receiving support and challenge from other group members.  Therapeutic Goals: 1. Patient will identify personal and current obstacles as they relate to admission. 2. Patient will identify barriers that currently interfere with their wellness or overcoming obstacles.  3. Patient will identify feelings, thought process and behaviors related to these barriers. 4. Patient will identify two changes they are willing to make to overcome these obstacles:    Summary of Patient Progress: Pt identified mental health and homelessness as obstacles in his life currently. Pt lost interest midway through group and started doing something else but rejoined when CSW asked him to.  Pt made a few comments during group discussion and in response to CSW questions.      Therapeutic Modalities:   Cognitive Behavioral Therapy Solution Focused Therapy Motivational Interviewing Relapse Prevention Therapy  Daleen SquibbGreg Takhia Spoon, LCSW

## 2018-02-28 NOTE — Progress Notes (Signed)
Recreation Therapy Notes  Date: 6.3.19 Time: 0930 Location: [address] Dayroom  Group Topic: Stress Management  Goal Area(s) Addresses:  Patient will verbalize importance of using healthy stress management.  Patient will identify positive emotions associated with healthy stress management.   Intervention: Stress Management  Activity :  Body Scan Meditation.  LRT introduced the stress management technique of meditation.  LRT played a meditation that focused on scanning the body for any feelings or sensations they may be feeling.  Patients were to follow along as meditation played.  Education:  Stress Management, Discharge Planning.   Education Outcome: Acknowledges edcuation/In group clarification offered/Needs additional education  Clinical Observations/Feedback: Pt did not attend group.    Hartlee Amedee, LRT/CTRS         Whitnee Orzel A 02/28/2018 11:34 AM 

## 2018-02-28 NOTE — Progress Notes (Signed)
D) Pt. Affect and mood appear improving.  Pt. Reports that his goal today is work on "getting his medications right".  Pt. Reports that he has a good support system among his other homeless friends and that he goes to a day shelter for food during the day and "sleeps under a bridge" at night.  Pt. Verbalized the steps he has taken to begin receiving disability. A) Pt. Offered support.  Validated for steps taken toward stability and contributions made in group. Medication education reviewed.   R) Pt. Receptive and remains safe at this time.

## 2018-03-01 DIAGNOSIS — F251 Schizoaffective disorder, depressive type: Secondary | ICD-10-CM

## 2018-03-01 DIAGNOSIS — F1099 Alcohol use, unspecified with unspecified alcohol-induced disorder: Secondary | ICD-10-CM

## 2018-03-01 DIAGNOSIS — Z21 Asymptomatic human immunodeficiency virus [HIV] infection status: Secondary | ICD-10-CM

## 2018-03-01 MED ORDER — LOPERAMIDE HCL 2 MG PO CAPS
2.0000 mg | ORAL_CAPSULE | ORAL | Status: DC | PRN
  Administered 2018-03-01: 4 mg via ORAL
  Administered 2018-03-02: 2 mg via ORAL
  Filled 2018-03-01: qty 1
  Filled 2018-03-01: qty 2

## 2018-03-01 MED ORDER — BICTEGRAVIR-EMTRICITAB-TENOFOV 50-200-25 MG PO TABS
1.0000 | ORAL_TABLET | Freq: Every day | ORAL | Status: DC
  Administered 2018-03-01 – 2018-03-04 (×4): 1 via ORAL
  Filled 2018-03-01 (×5): qty 1

## 2018-03-01 NOTE — Progress Notes (Signed)
Pt did not attend wrap-up group   

## 2018-03-01 NOTE — Progress Notes (Signed)
Pt has just awaken from sleep. Pt appears depressed in affect and mood. Pt denies SI/HI/AVH/Pain at this time. Pt c/o of diarrhea for over two days. Provider called. New orders obtained. See MAR. PRN trazodone requested and given. Continue with POC.

## 2018-03-01 NOTE — [facility] Group Notes (Signed)
LCSW Group Therapy Note 03/01/2018 1:05 PM  Type of Therapy/Topic: Group Therapy: Feelings about Diagnosis  Participation Level: Active   Description of Group:  This group will allow patients to explore their thoughts and feelings about diagnoses they have received. Patients will be guided to explore their level of understanding and acceptance of these diagnoses. Facilitator will encourage patients to process their thoughts and feelings about the reactions of others to their diagnosis and will guide patients in identifying ways to discuss their diagnosis with significant others in their lives. This group will be process-oriented, with patients participating in exploration of their own experiences, giving and receiving support, and processing challenge from other group members.  Therapeutic Goals: 1. Patient will demonstrate understanding of diagnosis as evidenced by identifying two or more symptoms of the disorder 2. Patient will be able to express two feelings regarding the diagnosis 3. Patient will demonstrate their ability to communicate their needs through discussion and/or role play  Summary of Patient Progress:  Edwin Martinez was engaged and participated throughout the group session. Edwin Martinez reports that he felt labeled when he wa initially given a mental health diagnosis. He states that he eventually accepted his diagnosis after he realized it was something that he had to live with. Edwin Martinez reports that his diagnosis negatively impacted his last relationship. He states that he made his boyfriend "crazy" and ran him off. Edwin Martinez states that he plans to remain on his medications so that he can remain "free".    Therapeutic Modalities:  Cognitive Behavioral Therapy Brief Therapy Feelings Identification    Edwin Martinez Edwin Martinez LCSWA Clinical Social Worker

## 2018-03-01 NOTE — Progress Notes (Addendum)
Pt presents with a flat affect. Pt reports decreased depression and anxiety today. Pt denies SI/HI. Pt denies AVH. Pt reports fair sleep and appetite. Pt voiced concerns about someone following up with [name] in regards to his Invega injection. Per CSW, it was [facility] [place]. MD made aware in progression. Pt reports tolerating meds well and denies any side effects.  Orders reviewed with pt. Verbal support provided. Pt encouraged to attend groups. 15 minute checks performed for safety. Pt status discussed in progression meeting. Suicide risk assessment completed, pt scored low risk.  Pt compliant with tx plan.

## 2018-03-01 NOTE — Progress Notes (Signed)
[facility] MD Progress Note  03/01/2018 2:50 PM Edwin Martinez  MRN:  [id number] Subjective: Patient is seen and examined.  Patient is a 32 year old male with a past psychiatric history significant for schizoaffective disorder as well as HIV positive.  He seen in follow-up.  He stated he feels better today.  He was seen in the day room laughing and engaging.  Most of our discussion today centered around his [place].  Social work found out that he had not had his [place] injection since January.  He said he felt as though the oral Risperdal was working well.  He stated he would rather be on the pill.  We discussed the fact that he has had multiple hospitalizations and that the injectable might be more appropriate.  He stated he want to be on the in [place] tablets.  I asked him if there was any issue with remaining on the Risperdal tablets.  He stated he felt like it was working well.  He also had not been on his HIV medication during the course of hospitalization, and I restarted it. Principal Problem: Schizoaffective disorder (HCC) Diagnosis:   Patient Active Problem List   Diagnosis Date Noted  . MDD (major depressive disorder) [F32.9] 02/26/2018  . Suicide attempt (HCC) [T14.91XA]   . Depression [F32.9] 11/08/2017  . Chest tightness [R07.89]   . Cough [R05]   . Leukocytosis [D72.829]   . SOB (shortness of breath) [R06.02]   . Nausea vomiting and diarrhea [R11.2, R19.7]   . Asthma exacerbation [J45.901] 08/15/2017  . Elevated LFTs [R94.5] 08/12/2017  . 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 Herpes zoster [B02.9] 03/06/2017  . Polysubstance abuse (HCC) [F19.10] 03/06/2017  . Homelessness [Z59.0] 03/06/2017  . Schizoaffective disorder (HCC) [F25.9] 12/23/2016  . Suicidal ideation [R45.851]   . Cannabis use disorder, moderate, dependence (HCC) [F12.20] 07/27/2016  . Tobacco user [Z72.0] 07/27/2016  . Intentional drug overdose (HCC) [T50.902A] 07/18/2016  .  Asthma [J45.909] 05/20/2007  . HIV (human immunodeficiency virus infection) (HCC) [B20] 05/05/2007   Total Time spent with patient: 30 minutes  Past Psychiatric History: See admission H&P  Past Medical History:  Past Medical History:  Diagnosis Date  . ADD (attention deficit disorder)   . Anxiety   . Asthma   . Bipolar 1 disorder (HCC)   . Depression   . HIV (human immunodeficiency virus infection) (HCC) dx'd 2008  . Hypertension   . Insomnia   . Intentional drug overdose (HCC)    [name] 02/25/2018  . Polysubstance abuse (HCC)    [name] 02/25/2018  . Schizophrenia (HCC)   . Seizures (HCC)    "used to have little black-out szs where I'd drop out for 2-3 min then come back; nothing in the last 2-3-4years" (02/25/2018)  . Shingles     Past Surgical History:  Procedure Laterality Date  . DENTAL SURGERY     "had my eye teeth pulled down"   Family History:  Family History  Problem Relation Age of Onset  . Huntington's disease Father   . Heart disease Mother   . Suicidality Maternal Uncle   . Suicidality Maternal Grandmother    Family Psychiatric  History: See admission H&P Social History:  Social History   Substance and Sexual Activity  Alcohol Use Yes  . Alcohol/week: 2.4 oz  . Types: 4 Cans of beer per week     Social History   Substance and Sexual Activity  Drug Use Yes  . Types: Marijuana,  Cocaine   Comment: 02/25/2018 "nothing in years"    Social History   Socioeconomic History  . Marital status: Single    Spouse name: Not on file  . Number of children: Not on file  . Years of education: Not on file  . Highest education level: Not on file  Occupational History  . Occupation: disability pending  Social Needs  . Financial resource strain: Not on file  . Food insecurity:    Worry: Not on file    Inability: Not on file  . Transportation needs:    Medical: Not on file    Non-medical: Not on file  Tobacco Use  . Smoking status: Current Every Day Smoker     Packs/day: 0.50    Years: 20.00    Pack years: 10.00    Types: Cigarettes    Start date: 09/29/1991  . Smokeless tobacco: Never Used  Substance and Sexual Activity  . Alcohol use: Yes    Alcohol/week: 2.4 oz    Types: 4 Cans of beer per week  . Drug use: Yes    Types: Marijuana, Cocaine    Comment: 02/25/2018 "nothing in years"  . Sexual activity: Yes    Birth control/protection: Condom  Lifestyle  . Physical activity:    Days per week: Not on file    Minutes per session: Not on file  . Stress: Not on file  Relationships  . Social connections:    Talks on phone: Not on file    Gets together: Not on file    Attends religious service: Not on file    Active member of club or organization: Not on file    Attends meetings of clubs or organizations: Not on file    Relationship status: Not on file  Other Topics Concern  . Not on file  Social History Narrative   ** Merged History Encounter **       ** Merged History Encounter **       Additional Social History:                         Sleep: Good  Appetite:  Good  Current Medications: Current Facility-Administered Medications  Medication Dose Route Frequency Provider Last Rate Last Dose  . albuterol (PROVENTIL HFA;VENTOLIN HFA) 108 (90 Base) MCG/ACT inhaler 2 puff  2 puff Inhalation Q6H PRN Oneta Rack, NP   2 puff at 02/27/18 [phone]  . benztropine (COGENTIN) tablet 0.5 mg  0.5 mg Oral QHS Oneta Rack, NP   0.5 mg at 02/28/18 2129  . bictegravir-emtricitabine-tenofovir AF (BIKTARVY) 50-200-25 MG per tablet 1 tablet  1 tablet Oral Daily Antonieta Pert, MD   1 tablet at 03/01/18 1322  . budesonide (PULMICORT) nebulizer solution 0.25 mg  0.25 mg Nebulization BID Oneta Rack, NP      . gabapentin (NEURONTIN) capsule 300 mg  300 mg Oral BID Oneta Rack, NP   300 mg at 03/01/18 9604  . hydrOXYzine (ATARAX/VISTARIL) tablet 25 mg  25 mg Oral TID PRN Oneta Rack, NP      . loratadine (CLARITIN)  tablet 10 mg  10 mg Oral Daily Oneta Rack, NP   10 mg at 03/01/18 5409  . risperiDONE (RISPERDAL) tablet 1 mg  1 mg Oral BID Cobos, Rockey Situ, MD   1 mg at 03/01/18 8119  . traZODone (DESYREL) tablet 50 mg  50 mg Oral QHS PRN Oneta Rack, NP  Lab Results: No results found for this or any previous visit (from the past 48 hour(s)).  Blood Alcohol level:  Lab Results  Component Value Date   ETH 129 (H) 02/25/2018   ETH <10 01/12/2018    Metabolic Disorder Labs: Lab Results  Component Value Date   HGBA1C 5.6 12/24/2016   MPG 114 12/24/2016   MPG 111 05/04/2016   Lab Results  Component Value Date   PROLACTIN 28.8 (H) 12/24/2016   PROLACTIN 32.3 (H) 08/19/2016   Lab Results  Component Value Date   CHOL 128 12/24/2016   TRIG 130 12/24/2016   HDL 35 (L) 12/24/2016   CHOLHDL 3.7 12/24/2016   VLDL 26 12/24/2016   LDLCALC 67 12/24/2016   LDLCALC 75 08/19/2016    Physical Findings: AIMS: Facial and Oral Movements Muscles of Facial Expression: None, normal Lips and Perioral Area: None, normal Jaw: None, normal Tongue: None, normal,Extremity Movements Upper (arms, wrists, hands, fingers): None, normal Lower (legs, knees, ankles, toes): None, normal, Trunk Movements Neck, shoulders, hips: None, normal, Overall Severity Severity of abnormal movements (highest score from questions above): None, normal Incapacitation due to abnormal movements: None, normal Patient's awareness of abnormal movements (rate only patient's report): No Awareness, Dental Status Current problems with teeth and/or dentures?: No Does patient usually wear dentures?: No  CIWA:    COWS:     Musculoskeletal: Strength & Muscle Tone: within normal limits Gait & Station: normal Patient leans: N/A  Psychiatric Specialty Exam: Physical Exam  Nursing note and vitals reviewed. Constitutional: He appears well-developed and well-nourished.  HENT:  Head: Normocephalic and atraumatic.   Respiratory: Effort normal.  Musculoskeletal: Normal range of motion.    ROS  Blood pressure 102/78, pulse 84, temperature 97.7 F (36.5 C), temperature source Oral, resp. rate 18, height 5\' 7"  (1.702 m), weight 74.8 kg (165 lb).Body mass index is 25.84 kg/m.  General Appearance: Casual  Eye Contact:  Good  Speech:  Clear and Coherent  Volume:  Normal  Mood:  Euthymic  Affect:  Congruent  Thought Process:  Coherent  Orientation:  Full (Time, Place, and Person)  Thought Content:  Logical  Suicidal Thoughts:  No  Homicidal Thoughts:  No  Memory:  Immediate;   Fair Recent;   Fair Remote;   Fair  Judgement:  Intact  Insight:  Lacking  Psychomotor Activity:  Normal  Concentration:  Concentration: Fair and Attention Span: Fair  Recall:  FiservFair  Fund of Knowledge:  Fair  Language:  Good  Akathisia:  Negative  Handed:  Right  AIMS (if indicated):     Assets:  Communication Skills Desire for Improvement  ADL's:  Intact  Cognition:  WNL  Sleep:  Number of Hours: 6.75     Treatment Plan Summary: Daily contact with patient to assess and evaluate symptoms and progress in treatment, Medication management and Plan Patient is seen and examined.  Patient is a 32 year old male with the above-stated past psychiatric history seen in follow-up.  He is doing well on oral Risperdal.  He prefers to be on pills versus injectable.  I discussed with him the advantage of being on the injectable with regard to compliance, but he still is insistent he wants the pills.  We decided to continue Risperdal pills given that it is working well, and that we would not have to switch to oral Invega.  I have restarted his Biktarvy since he stated he had been compliant with that.  No other changes in his medications.  If  he continues to do well, will consider discharge in 1 to 2 days.  Antonieta Pert, MD 03/01/2018, 2:50 PM

## 2018-03-01 NOTE — Progress Notes (Signed)
Writer verified pt home supply meds of [facility] Writer consulted with Pharmacy. Pts home supply of biktarvy placed back into his locker. Pt voiced to writer that he doesn't have an allergy to USG CorporationBiktarvy.

## 2018-03-01 NOTE — Plan of Care (Signed)
  Problem: Safety: Goal: Periods of time without injury will increase Outcome: Progressing   Problem: Coping: Goal: Level of anxiety will decrease Outcome: Progressing

## 2018-03-01 NOTE — Plan of Care (Signed)
Patient smiling, calm this evening. Denies AH, command or otherwise, and denies SI.

## 2018-03-01 NOTE — Progress Notes (Signed)
D: Patient observed resting in bed throughout evening. Patient awakened for assessment without difficulty. Patient reports having a good day, is observed to be smiling and relaxed. Denies any AH at this time and denies SI. Denies pain, physical complaints.   A: Medicated per orders, no prns requested or required. Medication education provided. Level III obs in place for safety. Emotional support offered. Patient encouraged to complete Suicide Safety Plan before discharge. Encouraged to attend and participate in unit programming.  Fall prevention plan in place and reviewed with patient as pt is a high fall risk due to seizure hx.   R: Patient verbalizes understanding of POC, falls prevention education.  Patient denies HI/VH and remains safe on level III obs. Will continue to monitor throughout the night.

## 2018-03-02 DIAGNOSIS — F419 Anxiety disorder, unspecified: Secondary | ICD-10-CM

## 2018-03-02 DIAGNOSIS — R197 Diarrhea, unspecified: Secondary | ICD-10-CM

## 2018-03-02 LAB — HEPATIC FUNCTION PANEL
ALT: 57 U/L (ref 17–63)
AST: 32 U/L (ref 15–41)
Albumin: 4.6 g/dL (ref 3.5–5.0)
Alkaline Phosphatase: 87 U/L (ref 38–126)
BILIRUBIN TOTAL: 0.5 mg/dL (ref 0.3–1.2)
Total Protein: 8.1 g/dL (ref 6.5–8.1)

## 2018-03-02 LAB — AMYLASE: AMYLASE: 372 U/L — AB (ref 28–100)

## 2018-03-02 LAB — LIPASE, BLOOD: Lipase: 28 U/L (ref 11–51)

## 2018-03-02 NOTE — Progress Notes (Signed)
Pt presents with a flat affect and anxious mood. Pt reports decreased depression and anxiety today. Pt denies SI/HI. Pt reports fair sleep at bedtime. Pt c/o ongoing diarrhea for the last three days. Pt administered Imodium at his request. Writer notified MD of pt's complaint in progression meeting. Pt reports tolerating his meds well.   GI stool panel completed and [facility] by the [facility]. Pt placed on enteric precautions as ordered by MD. Ranae PlumberWriter educated pt on proper hand hygiene. Writer informed pt that he's isolated to his room until he is asymptomatic or results post.  Orders reviewed with pt. Verbal support provided. Pt encouraged to attend groups. 15 minute checks performed for safety. Suicide risk assessment completed and pt identified as low risk.   Pt compliant with tx plan. Pt verbalized understanding of education provided to him by Clinical research associatewriter.

## 2018-03-02 NOTE — Therapy (Signed)
Occupational Therapy Group Treatment Note  Date:  03/02/2018 Time:  3:47 PM  Group Topic/Focus:  Stress Management  Participation Level:  Minimal  Participation Quality:  Inattentive  Affect:  Flat  Cognitive:  Appropriate  Insight: Improving  Engagement in Group:  Distracting and Limited  Modes of Intervention:  Activity, Discussion and Education  Additional Comments:    S: Pt entered group late, with lunch in hands not offering statements or actively participating.  O: Stress management group completed to use as productive coping strategy, to help mitigate maladaptive coping to integrate in functional BADL/IADL. Education given on the definition of stress and its cognitive, behavioral, emotional, and physical effects on the body. Stress symptom checklist completed. Stress management tool worksheet discussed to educate on unhealthy vs healthy coping skills to manage stress to improve community integration. Education given on use of progressive muscle relaxation. PMR script delivered with relaxing music to facilitate relaxation response to help increase ability to engage in BADL.   A: Pt presents to group at last quarter, with lunch in hands, eating and not actively participating in activities. Pt not wanting any handouts to take home after OT offering.  P: Pt provided with education on stress management activities to implement into daily routine. Handouts given to facilitate carryover when reintegrating into community     [facility] Surgery Center LLCKaylee Elmyra Banwart, [place] YorkMSOT, OTR/L  AvnetKaylee Cashay Manganelli 03/02/2018, 3:47 PM

## 2018-03-02 NOTE — Progress Notes (Signed)
Recreation Therapy Notes  Date: 6.5.19 Time: 0930 Location: [address] Dayroom  Group Topic: Stress Management  Goal Area(s) Addresses:  Patient will verbalize importance of using healthy stress management.  Patient will identify positive emotions associated with healthy stress management.   Intervention: Stress Management  Activity :  Guided Imagery.  LRT introduced patients to the stress management technique of guided imagery.  LRT read a script that lead patients on mental vacation to their peaceful place.  Patients were to follow along as the script was read to engage in the activity.  Education:  Stress Management, Discharge Planning.   Education Outcome: Acknowledges edcuation/In group clarification offered/Needs additional education  Clinical Observations/Feedback:  Pt did not attend group.     Malachi Suderman, LRT/CTRS         Travone Georg A 03/02/2018 11:01 AM 

## 2018-03-02 NOTE — Progress Notes (Addendum)
Pt appears isolative this evening. Pt was observed in room, seen resting in bed with eyes closed. Pt was awaken for assessment. Pt denies SI/HI/AVH/Pain at this time. No PRNs requested. Per MHT, Pt was seen in restroom twice at start of shift. Pt was educated and encourage to perform frequent handwashing. Will continue with POC.

## 2018-03-02 NOTE — Progress Notes (Signed)
[facility] MD Progress Note  03/02/2018 12:50 PM Edwin Martinez  MRN:  [id number] Subjective: Patient is seen and examined.  Patient is a 32 year old male with a past psychiatric history significant for schizoaffective disorder as well as HIV positive.  He is seen in follow-up.  He complains of significant diarrhea over the last 24 hours.  It may have been going on for a bit longer.  He has taken Imodium, and that has not been effective so far.  He stated he had to change his scrub pants twice.  He denied diarrhea prior to the hospitalization.  I asked him about whether or not he had diarrhea with his HIV medication.  He denied this.  None of the medications he is on right now at site of his HIV meds may be causing diarrhea.  I am a little bit concerned about the possibility of an opportunistic infection.  I wrote an order for stool sample to be collected and examined.  He stated his mood is doing well, but did not sleep well because of the diarrhea.  He denied any suicidal ideation.  We discussed the fact that if the Risperdal is causing the diarrhea it might be better to go back on the invega injectable like he had been on previously. Principal Problem: Schizoaffective disorder (HCC) Diagnosis:   Patient Active Problem List   Diagnosis Date Noted  . MDD (major depressive disorder) [F32.9] 02/26/2018  . Suicide attempt (HCC) [T14.91XA]   . Depression [F32.9] 11/08/2017  . Chest tightness [R07.89]   . Cough [R05]   . Leukocytosis [D72.829]   . SOB (shortness of breath) [R06.02]   . Nausea vomiting and diarrhea [R11.2, R19.7]   . Asthma exacerbation [J45.901] 08/15/2017  . Elevated LFTs [R94.5] 08/12/2017  . 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 Herpes zoster [B02.9] 03/06/2017  . Polysubstance abuse (HCC) [F19.10] 03/06/2017  . Homelessness [Z59.0] 03/06/2017  . Schizoaffective disorder (HCC) [F25.9] 12/23/2016  . Suicidal ideation [R45.851]   . Cannabis  use disorder, moderate, dependence (HCC) [F12.20] 07/27/2016  . Tobacco user [Z72.0] 07/27/2016  . Intentional drug overdose (HCC) [T50.902A] 07/18/2016  . Asthma [J45.909] 05/20/2007  . HIV (human immunodeficiency virus infection) (HCC) [B20] 05/05/2007   Total Time spent with patient: 20 minutes  Past Psychiatric History: See admission H&P  Past Medical History:  Past Medical History:  Diagnosis Date  . ADD (attention deficit disorder)   . Anxiety   . Asthma   . Bipolar 1 disorder (HCC)   . Depression   . HIV (human immunodeficiency virus infection) (HCC) dx'd 2008  . Hypertension   . Insomnia   . Intentional drug overdose (HCC)    [name] 02/25/2018  . Polysubstance abuse (HCC)    [name] 02/25/2018  . Schizophrenia (HCC)   . Seizures (HCC)    "used to have little black-out szs where I'd drop out for 2-3 min then come back; nothing in the last 2-3-4years" (02/25/2018)  . Shingles     Past Surgical History:  Procedure Laterality Date  . DENTAL SURGERY     "had my eye teeth pulled down"   Family History:  Family History  Problem Relation Age of Onset  . Huntington's disease Father   . Heart disease Mother   . Suicidality Maternal Uncle   . Suicidality Maternal Grandmother    Family Psychiatric  History: See admission H&P Social History:  Social History   Substance and Sexual Activity  Alcohol Use Yes  .  Alcohol/week: 2.4 oz  . Types: 4 Cans of beer per week     Social History   Substance and Sexual Activity  Drug Use Yes  . Types: Marijuana, Cocaine   Comment: 02/25/2018 "nothing in years"    Social History   Socioeconomic History  . Marital status: Single    Spouse name: Not on file  . Number of children: Not on file  . Years of education: Not on file  . Highest education level: Not on file  Occupational History  . Occupation: disability pending  Social Needs  . Financial resource strain: Not on file  . Food insecurity:    Worry: Not on file     Inability: Not on file  . Transportation needs:    Medical: Not on file    Non-medical: Not on file  Tobacco Use  . Smoking status: Current Every Day Smoker    Packs/day: 0.50    Years: 20.00    Pack years: 10.00    Types: Cigarettes    Start date: 09/29/1991  . Smokeless tobacco: Never Used  Substance and Sexual Activity  . Alcohol use: Yes    Alcohol/week: 2.4 oz    Types: 4 Cans of beer per week  . Drug use: Yes    Types: Marijuana, Cocaine    Comment: 02/25/2018 "nothing in years"  . Sexual activity: Yes    Birth control/protection: Condom  Lifestyle  . Physical activity:    Days per week: Not on file    Minutes per session: Not on file  . Stress: Not on file  Relationships  . Social connections:    Talks on phone: Not on file    Gets together: Not on file    Attends religious service: Not on file    Active member of club or organization: Not on file    Attends meetings of clubs or organizations: Not on file    Relationship status: Not on file  Other Topics Concern  . Not on file  Social History Narrative   ** Merged History Encounter **       ** Merged History Encounter **       Additional Social History:                         Sleep: Fair  Appetite:  Fair  Current Medications: Current Facility-Administered Medications  Medication Dose Route Frequency Provider Last Rate Last Dose  . albuterol (PROVENTIL HFA;VENTOLIN HFA) 108 (90 Base) MCG/ACT inhaler 2 puff  2 puff Inhalation Q6H PRN Oneta RackLewis, Tanika N, NP   2 puff at 03/02/18 0746  . benztropine (COGENTIN) tablet 0.5 mg  0.5 mg Oral QHS Oneta RackLewis, Tanika N, NP   0.5 mg at 03/01/18 2119  . bictegravir-emtricitabine-tenofovir AF (BIKTARVY) 50-200-25 MG per tablet 1 tablet  1 tablet Oral Daily Antonieta Pertlary, Acquanetta Cabanilla Lawson, MD   1 tablet at 03/02/18 0746  . budesonide (PULMICORT) nebulizer solution 0.25 mg  0.25 mg Nebulization BID Oneta RackLewis, Tanika N, NP      . hydrOXYzine (ATARAX/VISTARIL) tablet 25 mg  25 mg Oral TID  PRN Oneta RackLewis, Tanika N, NP      . loperamide (IMODIUM) capsule 2-4 mg  2-4 mg Oral PRN Nira ConnBerry, Jason A, NP   2 mg at 03/02/18 0746  . loratadine (CLARITIN) tablet 10 mg  10 mg Oral Daily Oneta RackLewis, Tanika N, NP   10 mg at 03/02/18 0746  . risperiDONE (RISPERDAL) tablet 1 mg  1  mg Oral BID Cobos, Rockey Situ, MD   1 mg at 03/02/18 0746  . traZODone (DESYREL) tablet 50 mg  50 mg Oral QHS PRN Oneta Rack, NP   50 mg at 03/01/18 2118    Lab Results: No results found for this or any previous visit (from the past 48 hour(s)).  Blood Alcohol level:  Lab Results  Component Value Date   ETH 129 (H) 02/25/2018   ETH <10 01/12/2018    Metabolic Disorder Labs: Lab Results  Component Value Date   HGBA1C 5.6 12/24/2016   MPG 114 12/24/2016   MPG 111 05/04/2016   Lab Results  Component Value Date   PROLACTIN 28.8 (H) 12/24/2016   PROLACTIN 32.3 (H) 08/19/2016   Lab Results  Component Value Date   CHOL 128 12/24/2016   TRIG 130 12/24/2016   HDL 35 (L) 12/24/2016   CHOLHDL 3.7 12/24/2016   VLDL 26 12/24/2016   LDLCALC 67 12/24/2016   LDLCALC 75 08/19/2016    Physical Findings: AIMS: Facial and Oral Movements Muscles of Facial Expression: None, normal Lips and Perioral Area: None, normal Jaw: None, normal Tongue: None, normal,Extremity Movements Upper (arms, wrists, hands, fingers): None, normal Lower (legs, knees, ankles, toes): None, normal, Trunk Movements Neck, shoulders, hips: None, normal, Overall Severity Severity of abnormal movements (highest score from questions above): None, normal Incapacitation due to abnormal movements: None, normal Patient's awareness of abnormal movements (rate only patient's report): No Awareness, Dental Status Current problems with teeth and/or dentures?: No Does patient usually wear dentures?: No  CIWA:    COWS:     Musculoskeletal: Strength & Muscle Tone: within normal limits Gait & Station: normal Patient leans: N/A  Psychiatric Specialty  Exam: Physical Exam  Nursing note and vitals reviewed. Constitutional: He is oriented to person, place, and time. He appears well-developed and well-nourished.  HENT:  Head: Normocephalic and atraumatic.  Respiratory: Effort normal.  Neurological: He is alert and oriented to person, place, and time.    ROS  Blood pressure 120/74, pulse 74, temperature 98.1 F (36.7 C), temperature source Oral, resp. rate 18, height 5\' 7"  (1.702 m), weight 74.8 kg (165 lb).Body mass index is 25.84 kg/m.  General Appearance: Casual  Eye Contact:  Good  Speech:  Normal Rate  Volume:  Normal  Mood:  Anxious  Affect:  Congruent  Thought Process:  Coherent  Orientation:  Full (Time, Place, and Person)  Thought Content:  Logical  Suicidal Thoughts:  No  Homicidal Thoughts:  No  Memory:  Immediate;   Fair Recent;   Fair Remote;   Fair  Judgement:  Intact  Insight:  Fair  Psychomotor Activity:  Normal  Concentration:  Concentration: Fair and Attention Span: Fair  Recall:  [employer] of Knowledge:  Good  Language:  Fair  Akathisia:  Negative  Handed:  Right  AIMS (if indicated):     Assets:  Communication Skills Desire for Improvement Housing Resilience Social Support  ADL's:  Intact  Cognition:  WNL  Sleep:  Number of Hours: 6.5     Treatment Plan Summary: Daily contact with patient to assess and evaluate symptoms and progress in treatment, Medication management and Plan Patient is seen and examined.  Patient is a 32 year old male with the above-stated past psychiatric history seen in follow-up.  His primary issues today are physical.  This is to his diarrhea.  I am concerned for the possibility of underlying opportunistic infection.  I have sent a sample of stool  off to the lab for examination.  I have also stopped the gabapentin in case that is leading to this.  Risperdal if anything would lead to constipation..  I have also sent off laboratories on him to make sure that he is not having  some reaction his liver or pancreas that might lead to diarrhea.  No other changes in his medications at this point.  If it ends up being his Risperdal we may have to go with the long-acting Invega injection.  Hopefully this is just some GI normal reaction and that the diarrhea will resolve.  Antonieta Pert, MD 03/02/2018, 12:50 PM

## 2018-03-02 NOTE — [facility] Group Notes (Signed)
[facility] Group Therapy 03/02/2018 1:15pm  Type of Therapy: [facility] Presentation  Participation Level: Invited. Chose to remain in bed.   Brek Reece S Balraj Brayfield, LCSW 03/02/2018 3:01 PM  

## 2018-03-02 NOTE — Progress Notes (Signed)
G.I. Panel resulted. See labs. Provider on call and [facility] notified. Infection Control was called. Per their recommendations, Pt is to remain on Enteric Precautions. Will pass to to staff to observed Pt and follow protocol.

## 2018-03-03 DIAGNOSIS — A048 Other specified bacterial intestinal infections: Secondary | ICD-10-CM

## 2018-03-03 DIAGNOSIS — A079 Protozoal intestinal disease, unspecified: Secondary | ICD-10-CM

## 2018-03-03 DIAGNOSIS — A0811 Acute gastroenteropathy due to Norwalk agent: Secondary | ICD-10-CM

## 2018-03-03 DIAGNOSIS — A071 Giardiasis [lambliasis]: Secondary | ICD-10-CM

## 2018-03-03 LAB — GASTROINTESTINAL PANEL BY PCR, STOOL (REPLACES STOOL CULTURE)
Adenovirus F40/41: NOT DETECTED
Astrovirus: NOT DETECTED
CYCLOSPORA CAYETANENSIS: NOT DETECTED
Campylobacter species: NOT DETECTED
Cryptosporidium: NOT DETECTED
ENTAMOEBA HISTOLYTICA: NOT DETECTED
Enteroaggregative E coli (EAEC): NOT DETECTED
Enteropathogenic E coli (EPEC): NOT DETECTED
Enterotoxigenic E coli (ETEC): NOT DETECTED
Giardia lamblia: DETECTED — AB
Norovirus GI/GII: DETECTED — AB
Plesimonas shigelloides: NOT DETECTED
Rotavirus A: NOT DETECTED
SALMONELLA SPECIES: NOT DETECTED
SHIGELLA/ENTEROINVASIVE E COLI (EIEC): NOT DETECTED
Sapovirus (I, II, IV, and V): NOT DETECTED
Shiga like toxin producing E coli (STEC): NOT DETECTED
VIBRIO CHOLERAE: NOT DETECTED
VIBRIO SPECIES: NOT DETECTED
Yersinia enterocolitica: NOT DETECTED

## 2018-03-03 LAB — HEPATITIS PANEL, ACUTE
HCV Ab: >11 s/co ratio — ABNORMAL HIGH (ref 0.0–0.9)
HEP A IGM: NEGATIVE
HEP B C IGM: NEGATIVE
HEP B S AG: NEGATIVE

## 2018-03-03 MED ORDER — GABAPENTIN 300 MG PO CAPS
300.0000 mg | ORAL_CAPSULE | Freq: Three times a day (TID) | ORAL | Status: DC
  Administered 2018-03-03 – 2018-03-04 (×3): 300 mg via ORAL
  Filled 2018-03-03 (×6): qty 1

## 2018-03-03 MED ORDER — FLUTICASONE PROPIONATE 50 MCG/ACT NA SUSP
2.0000 | Freq: Every day | NASAL | Status: DC
  Administered 2018-03-03 – 2018-03-04 (×2): 2 via NASAL
  Filled 2018-03-03 (×2): qty 16

## 2018-03-03 MED ORDER — METRONIDAZOLE 500 MG PO TABS
500.0000 mg | ORAL_TABLET | Freq: Three times a day (TID) | ORAL | Status: DC
  Administered 2018-03-03 – 2018-03-04 (×4): 500 mg via ORAL
  Filled 2018-03-03 (×6): qty 1
  Filled 2018-03-03: qty 2
  Filled 2018-03-03: qty 1

## 2018-03-03 NOTE — Plan of Care (Signed)
  Problem: Education: Goal: Emotional status will improve Outcome: Progressing   Problem: Activity: Goal: Interest or engagement in activities will improve Outcome: Progressing   Problem: Coping: Goal: Ability to demonstrate self-control will improve Outcome: Progressing   Problem: Safety: Goal: Periods of time without injury will increase Outcome: Progressing   

## 2018-03-03 NOTE — [facility] Group Notes (Signed)
[facility] LCSW Group Therapy Note  Date/Time: 03/03/18, 1315  Type of Therapy/Topic:  Group Therapy:  Balance in Life  Participation Level:  Did not attend  Description of Group:    This group will address the concept of balance and how it feels and looks when one is unbalanced. Patients will be encouraged to process areas in their lives that are out of balance, and identify reasons for remaining unbalanced. Facilitators will guide patients utilizing problem- solving interventions to address and correct the stressor making their life unbalanced. Understanding and applying boundaries will be explored and addressed for obtaining  and maintaining a balanced life. Patients will be encouraged to explore ways to assertively make their unbalanced needs known to significant others in their lives, using other group members and facilitator for support and feedback.  Therapeutic Goals: 1. Patient will identify two or more emotions or situations they have that consume much of in their lives. 2. Patient will identify signs/triggers that life has become out of balance:  3. Patient will identify two ways to set boundaries in order to achieve balance in their lives:  4. Patient will demonstrate ability to communicate their needs through discussion and/or role plays  Summary of Patient Progress:          Therapeutic Modalities:   Cognitive Behavioral Therapy Solution-Focused Therapy Assertiveness Training  Daleen SquibbGreg Karessa Onorato, LCSW

## 2018-03-03 NOTE — [facility] Group Notes (Signed)
Pt did not attend wrap up group this evening. Pt stayed in bed sleeping instead.

## 2018-03-03 NOTE — Progress Notes (Signed)
Pt was observed in room, seen resting in bed with eyes closed. Pt was awaken for assessment. Pt denies SI/HI/AVH/Pain at this time. Pt remains isolative in milieu.PRN trazodone requested and given.Pt was educated and encourage to perform frequent handwashing and push fluids.To remain on enteric precautions per order.Will continue with POC.

## 2018-03-03 NOTE — Progress Notes (Signed)
[facility] MD Progress Note  03/03/2018 12:26 PM Edwin Martinez  MRN:  [id number] Subjective: Patient is seen and examined.  Patient is a 32 year old male with a past psychiatric history significant for major depression, schizoaffective disorder and a past medical history significant for being HIV positive.  He seen in follow-up.  He complained of diarrhea yesterday, and there was some concern that may be infectious given his HIV status.  Laboratories were obtained.  Unfortunately he came up positive for Giardia as well as norovirus.  Norovirus is basically treated symptomatically, but Giardia can be treated with Flagyl.  The patient does not have an allergy to sulfa.  He was started on 500 mg p.o. every 8 hours.  He has not had any diarrhea over the last 24 hours.  We discussed this.  His psychiatric situation is stable.  He denied any auditory or visual hallucinations.  He stated his mood was improving, and he was not suicidal.  We discussed restarting his Neurontin today. Principal Problem: Schizoaffective disorder (HCC) Diagnosis:   Patient Active Problem List   Diagnosis Date Noted  . MDD (major depressive disorder) [F32.9] 02/26/2018  . Suicide attempt (HCC) [T14.91XA]   . Depression [F32.9] 11/08/2017  . Chest tightness [R07.89]   . Cough [R05]   . Leukocytosis [D72.829]   . SOB (shortness of breath) [R06.02]   . Nausea vomiting and diarrhea [R11.2, R19.7]   . Asthma exacerbation [J45.901] 08/15/2017  . Elevated LFTs [R94.5] 08/12/2017  . 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 Herpes zoster [B02.9] 03/06/2017  . Polysubstance abuse (HCC) [F19.10] 03/06/2017  . Homelessness [Z59.0] 03/06/2017  . Schizoaffective disorder (HCC) [F25.9] 12/23/2016  . Suicidal ideation [R45.851]   . Cannabis use disorder, moderate, dependence (HCC) [F12.20] 07/27/2016  . Tobacco user [Z72.0] 07/27/2016  . Intentional drug overdose (HCC) [T50.902A] 07/18/2016  . Asthma  [J45.909] 05/20/2007  . HIV (human immunodeficiency virus infection) (HCC) [B20] 05/05/2007   Total Time spent with patient: 20 minutes  Past Psychiatric History: See admission H&P  Past Medical History:  Past Medical History:  Diagnosis Date  . ADD (attention deficit disorder)   . Anxiety   . Asthma   . Bipolar 1 disorder (HCC)   . Depression   . HIV (human immunodeficiency virus infection) (HCC) dx'd 2008  . Hypertension   . Insomnia   . Intentional drug overdose (HCC)    [name] 02/25/2018  . Polysubstance abuse (HCC)    [name] 02/25/2018  . Schizophrenia (HCC)   . Seizures (HCC)    "used to have little black-out szs where I'd drop out for 2-3 min then come back; nothing in the last 2-3-4years" (02/25/2018)  . Shingles     Past Surgical History:  Procedure Laterality Date  . DENTAL SURGERY     "had my eye teeth pulled down"   Family History:  Family History  Problem Relation Age of Onset  . Huntington's disease Father   . Heart disease Mother   . Suicidality Maternal Uncle   . Suicidality Maternal Grandmother    Family Psychiatric  History: See admission H&P Social History:  Social History   Substance and Sexual Activity  Alcohol Use Yes  . Alcohol/week: 2.4 oz  . Types: 4 Cans of beer per week     Social History   Substance and Sexual Activity  Drug Use Yes  . Types: Marijuana, Cocaine   Comment: 02/25/2018 "nothing in years"    Social History   Socioeconomic  History  . Marital status: Single    Spouse name: Not on file  . Number of children: Not on file  . Years of education: Not on file  . Highest education level: Not on file  Occupational History  . Occupation: disability pending  Social Needs  . Financial resource strain: Not on file  . Food insecurity:    Worry: Not on file    Inability: Not on file  . Transportation needs:    Medical: Not on file    Non-medical: Not on file  Tobacco Use  . Smoking status: Current Every Day Smoker     Packs/day: 0.50    Years: 20.00    Pack years: 10.00    Types: Cigarettes    Start date: 09/29/1991  . Smokeless tobacco: Never Used  Substance and Sexual Activity  . Alcohol use: Yes    Alcohol/week: 2.4 oz    Types: 4 Cans of beer per week  . Drug use: Yes    Types: Marijuana, Cocaine    Comment: 02/25/2018 "nothing in years"  . Sexual activity: Yes    Birth control/protection: Condom  Lifestyle  . Physical activity:    Days per week: Not on file    Minutes per session: Not on file  . Stress: Not on file  Relationships  . Social connections:    Talks on phone: Not on file    Gets together: Not on file    Attends religious service: Not on file    Active member of club or organization: Not on file    Attends meetings of clubs or organizations: Not on file    Relationship status: Not on file  Other Topics Concern  . Not on file  Social History Narrative   ** Merged History Encounter **       ** Merged History Encounter **       Additional Social History:                         Sleep: Fair  Appetite:  Fair  Current Medications: Current Facility-Administered Medications  Medication Dose Route Frequency Provider Last Rate Last Dose  . albuterol (PROVENTIL HFA;VENTOLIN HFA) 108 (90 Base) MCG/ACT inhaler 2 puff  2 puff Inhalation Q6H PRN Oneta Rack, NP   2 puff at 03/02/18 0746  . benztropine (COGENTIN) tablet 0.5 mg  0.5 mg Oral QHS Oneta Rack, NP   0.5 mg at 03/01/18 2119  . bictegravir-emtricitabine-tenofovir AF (BIKTARVY) 50-200-25 MG per tablet 1 tablet  1 tablet Oral Daily Antonieta Pert, MD   1 tablet at 03/03/18 [phone]  . budesonide (PULMICORT) nebulizer solution 0.25 mg  0.25 mg Nebulization BID Oneta Rack, NP      . gabapentin (NEURONTIN) capsule 300 mg  300 mg Oral TID Antonieta Pert, MD   300 mg at 03/03/18 9604  . hydrOXYzine (ATARAX/VISTARIL) tablet 25 mg  25 mg Oral TID PRN Oneta Rack, NP      . loperamide (IMODIUM)  capsule 2-4 mg  2-4 mg Oral PRN Nira Conn A, NP   2 mg at 03/02/18 0746  . loratadine (CLARITIN) tablet 10 mg  10 mg Oral Daily Oneta Rack, NP   10 mg at 03/03/18 0806  . metroNIDAZOLE (FLAGYL) tablet 500 mg  500 mg Oral Q8H Antonieta Pert, MD   500 mg at 03/03/18 0939  . risperiDONE (RISPERDAL) tablet 1 mg  1 mg Oral  BID Cobos, Rockey Situ, MD   1 mg at 03/03/18 0806  . traZODone (DESYREL) tablet 50 mg  50 mg Oral QHS PRN Oneta Rack, NP   50 mg at 03/01/18 2118    Lab Results:  Results for orders placed or performed during the hospital encounter of 02/26/18 (from the past 48 hour(s))  Gastrointestinal Panel by PCR , Stool     Status: Abnormal   Collection Time: 03/02/18  9:57 AM  Result Value Ref Range   Campylobacter species NOT DETECTED NOT DETECTED   Plesimonas shigelloides NOT DETECTED NOT DETECTED   Salmonella species NOT DETECTED NOT DETECTED   Yersinia enterocolitica NOT DETECTED NOT DETECTED   Vibrio species NOT DETECTED NOT DETECTED   Vibrio cholerae NOT DETECTED NOT DETECTED   Enteroaggregative E coli (EAEC) NOT DETECTED NOT DETECTED   Enteropathogenic E coli (EPEC) NOT DETECTED NOT DETECTED   Enterotoxigenic E coli (ETEC) NOT DETECTED NOT DETECTED   Shiga like toxin producing E coli (STEC) NOT DETECTED NOT DETECTED   Shigella/Enteroinvasive E coli (EIEC) NOT DETECTED NOT DETECTED   Cryptosporidium NOT DETECTED NOT DETECTED   Cyclospora cayetanensis NOT DETECTED NOT DETECTED   Entamoeba histolytica NOT DETECTED NOT DETECTED   Giardia lamblia DETECTED (A) NOT DETECTED   Adenovirus F40/41 NOT DETECTED NOT DETECTED   Astrovirus NOT DETECTED NOT DETECTED   Norovirus GI/GII DETECTED (A) NOT DETECTED    Comment: RESULT CALLED TO, READ BACK BY AND VERIFIED WITH: EMILY NGUYEN ON 03/02/18 AT 2158 BY AKT    Rotavirus A NOT DETECTED NOT DETECTED   Sapovirus (I, II, IV, and V) NOT DETECTED NOT DETECTED    Comment: Performed at [facility], [address] [place], [place] [address]  Amylase     Status: Abnormal   Collection Time: 03/02/18  6:21 PM  Result Value Ref Range   Amylase 372 (H) 28 - 100 U/L    Comment: Performed at [facility], [address] [place], [place] [address]  Lipase, blood     Status: None   Collection Time: 03/02/18  6:21 PM  Result Value Ref Range   Lipase 28 11 - 51 U/L    Comment: Performed at [facility], [address], [place], [place] [address]  Hepatic function panel     Status: Abnormal   Collection Time: 03/02/18  6:21 PM  Result Value Ref Range   Total Protein 8.1 6.5 - 8.1 g/dL   Albumin 4.6 3.5 - 5.0 g/dL   AST 32 15 - 41 U/L   ALT 57 17 - 63 U/L   Alkaline Phosphatase 87 38 - 126 U/L   Total Bilirubin 0.5 0.3 - 1.2 mg/dL   Bilirubin, Direct <9.1 (L) 0.1 - 0.5 mg/dL   Indirect Bilirubin NOT CALCULATED 0.3 - 0.9 mg/dL    Comment: Performed at [facility], [address] [place], [place] [address]  Hepatitis panel, acute     Status: Abnormal   Collection Time: 03/02/18  6:21 PM  Result Value Ref Range   Hepatitis B Surface Ag Negative Negative   HCV Ab >11.0 (H) 0.0 - 0.9 s/co ratio    Comment: (NOTE)                                  Negative:     < 0.8  Indeterminate: 0.8 - 0.9                                  Positive:     > 0.9 The CDC recommends that a positive HCV antibody result be followed up with a HCV Nucleic Acid Amplification test (811914). Performed At: [facility] [address] [place], [place] [id number] Jolene Schimke MD [id number]    Hep A IgM Negative Negative   Hep B C IgM Negative Negative    Comment: Performed at [facility], [address] [place], [place] [address]    Blood Alcohol level:  Lab Results  Component Value Date   ETH 129 (H) 02/25/2018   ETH <10 01/12/2018    Metabolic Disorder Labs: Lab Results  Component Value Date   HGBA1C  5.6 12/24/2016   MPG 114 12/24/2016   MPG 111 05/04/2016   Lab Results  Component Value Date   PROLACTIN 28.8 (H) 12/24/2016   PROLACTIN 32.3 (H) 08/19/2016   Lab Results  Component Value Date   CHOL 128 12/24/2016   TRIG 130 12/24/2016   HDL 35 (L) 12/24/2016   CHOLHDL 3.7 12/24/2016   VLDL 26 12/24/2016   LDLCALC 67 12/24/2016   LDLCALC 75 08/19/2016    Physical Findings: AIMS: Facial and Oral Movements Muscles of Facial Expression: None, normal Lips and Perioral Area: None, normal Jaw: None, normal Tongue: None, normal,Extremity Movements Upper (arms, wrists, hands, fingers): None, normal Lower (legs, knees, ankles, toes): None, normal, Trunk Movements Neck, shoulders, hips: None, normal, Overall Severity Severity of abnormal movements (highest score from questions above): None, normal Incapacitation due to abnormal movements: None, normal Patient's awareness of abnormal movements (rate only patient's report): No Awareness, Dental Status Current problems with teeth and/or dentures?: No Does patient usually wear dentures?: No  CIWA:    COWS:     Musculoskeletal: Strength & Muscle Tone: within normal limits Gait & Station: normal Patient leans: N/A  Psychiatric Specialty Exam: Physical Exam  Nursing note and vitals reviewed. Constitutional: He is oriented to person, place, and time. He appears well-developed and well-nourished.  HENT:  Head: Normocephalic.  Respiratory: Effort normal.  Musculoskeletal: Normal range of motion.  Neurological: He is alert and oriented to person, place, and time.    ROS  Blood pressure 114/90, pulse 86, temperature 98.2 F (36.8 C), temperature source Oral, resp. rate 20, height 5\' 7"  (1.702 m), weight 74.8 kg (165 lb).Body mass index is 25.84 kg/m.  General Appearance: Casual  Eye Contact:  Fair  Speech:  Normal Rate  Volume:  Normal  Mood:  Anxious  Affect:  Congruent  Thought Process:  Coherent  Orientation:  Full (Time,  Place, and Person)  Thought Content:  Logical  Suicidal Thoughts:  No  Homicidal Thoughts:  No  Memory:  Immediate;   Good Recent;   Good Remote;   Good  Judgement:  Intact  Insight:  Fair  Psychomotor Activity:  Normal  Concentration:  Concentration: Fair and Attention Span: Fair  Recall:  [employer] of Knowledge:  Good  Language:  Good  Akathisia:  Negative  Handed:  Right  AIMS (if indicated):     Assets:  Communication Skills Desire for Improvement Resilience Social Support  ADL's:  Intact  Cognition:  WNL  Sleep:  Number of Hours: 6.75     Treatment Plan Summary: Daily contact with patient to assess and evaluate  symptoms and progress in treatment, Medication management and Plan Patient is seen and examined.  Patient is a 32 year old male with the above-stated past psychiatric history seen in follow-up.  His mood and anxiety are improving.  He is having no auditory or visual hallucinations.  No paranoia.  His sleep is good.  His diarrhea is reducing, and he has polymerase chain reaction studies positive for Giardia as well as norovirus.  He has been on started on 500 mg of Flagyl 3 times daily.  We will continue all of his medications at their current dosages.  If everything continues to go well over the next 24 hours we will consider discharge in the a.m.  Antonieta PertGreg Lawson Laurianne Floresca, MD 03/03/2018, 12:26 PM

## 2018-03-03 NOTE — Progress Notes (Signed)
Patient is observed out of room.  He is taking his meals in the cafeteria.  Patient is asymptomatic.  He denies any diarrhea.  He is complaining of congestion and flonaise was ordered.

## 2018-03-03 NOTE — Progress Notes (Signed)
  Pt presents with a flat affect and anxious mood. Pt reports decreased depression and anxiety today. Pt denies SI/HI. Pt denies AVH. Pt reports fair sleep at bedtime. Pt reports tolerating his meds well. Pt denies active diarrhea today.   GI stool panel completed and pt pos for Norivirus and Giardia. Pt informed of results by MD. Pt educated on proper hand hygiene. Pt informed that he will be isolated to his room if he becomes symptomatic. Pt continues on enteric precautions as ordered by MD. Orders reviewed with pt. Verbal support provided. Pt encouraged to attend groups. 15 minute checks performed for safety. Suicide risk assessment completed and pt identified as low risk.   Pt compliant with tx plan.Pt verbalized understanding of education provided to him by Clinical research associatewriter.

## 2018-03-04 DIAGNOSIS — Y906 Blood alcohol level of 120-199 mg/100 ml: Secondary | ICD-10-CM

## 2018-03-04 MED ORDER — ALBUTEROL SULFATE HFA 108 (90 BASE) MCG/ACT IN AERS: 2.0000 | INHALATION_SPRAY | Freq: Four times a day (QID) | RESPIRATORY_TRACT | 0 refills | Status: DC | PRN

## 2018-03-04 MED ORDER — METRONIDAZOLE 500 MG PO TABS: 500.0000 mg | ORAL_TABLET | Freq: Three times a day (TID) | ORAL | 0 refills | Status: DC

## 2018-03-04 MED ORDER — HYDROXYZINE HCL 25 MG PO TABS: 25.0000 mg | ORAL_TABLET | Freq: Three times a day (TID) | ORAL | 0 refills | Status: DC | PRN

## 2018-03-04 MED ORDER — RISPERIDONE 1 MG PO TABS: 1.0000 mg | ORAL_TABLET | Freq: Two times a day (BID) | ORAL | 0 refills | Status: DC

## 2018-03-04 MED ORDER — GABAPENTIN 300 MG PO CAPS: 300.0000 mg | ORAL_CAPSULE | Freq: Three times a day (TID) | ORAL | 0 refills | Status: DC

## 2018-03-04 MED ORDER — TRAZODONE HCL 50 MG PO TABS: 50.0000 mg | ORAL_TABLET | Freq: Every evening | ORAL | 0 refills | Status: DC | PRN

## 2018-03-04 MED ORDER — BENZTROPINE MESYLATE 0.5 MG PO TABS: 0.5000 mg | ORAL_TABLET | Freq: Every day | ORAL | 0 refills | Status: DC

## 2018-03-04 NOTE — Progress Notes (Signed)
Recreation Therapy Notes  Date: 6.7.19 Time: 0930 Location: [address] Dayroom  Group Topic: Stress Management  Goal Area(s) Addresses:  Patient will verbalize importance of using healthy stress management.  Patient will identify positive emotions associated with healthy stress management.   Intervention: Stress Management  Activity :  LRT introduced the stress management technique of meditation.  LRT played that focused on being resilient in the face of struggle.  Patients were to follow along as the meditation played to engage in the activity.  Education:  Stress Management, Discharge Planning.   Education Outcome: Acknowledges edcuation/In group clarification offered/Needs additional education  Clinical Observations/Feedback: Pt did not attend group.    Caroll RancherMarjette Yalena Colon, LRT/CTRS         Caroll RancherLindsay, Artavious Trebilcock A 03/04/2018 12:09 PM

## 2018-03-04 NOTE — Progress Notes (Addendum)
Discharge note:  Patient discharged home per MD order.  Reviewed AVS/transition record with patient and he indicated understanding.  Patient denies any thoughts of self harm.  He received all personal property from his locker and unit. Patient left ambulatory with prescriptions and medication samples.  He will follow up with [name].  Patient received 2 bus passes for transportation.
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
## 2018-03-04 NOTE — Progress Notes (Signed)
  [facility] Adult Case Management Discharge Plan :  Will you be returning to the same living situation after discharge:  Yes,  homeless shelter; [facility] resources provided At discharge, do you have transportation home?: Yes,  bus passes Do you have the ability to pay for your medications: No.  Release of information consent forms completed and in the chart;  Patient's signature needed at discharge.  Patient to Follow up at: Follow-up Information    [name]. Go on 03/07/2018.   Specialty:  Behavioral Health Why:  Appointment is Monday, 03/07/18 at 12:45pm. Please be sure to bring your Photo ID, medication list and any discharge paperwork from the hospital.  Contact information: [address] [place] [address] [phone]           Next level of care provider has [facility] Link:yes  Safety Planning and Suicide Prevention discussed: Yes,  with the patient  Have you used any form of tobacco in the last 30 days? (Cigarettes, Smokeless Tobacco, Cigars, and/or Pipes): Yes  Has patient been referred to the Quitline?: Patient refused referral  Patient has been referred for addiction treatment: N/A  Maeola SarahJolan E Sanita Estrada, LCSWA 03/04/2018, 10:46 AM

## 2018-03-04 NOTE — Plan of Care (Signed)
Problem: Education: Goal: Emotional status will improve Outcome: Completed/Met Goal: Mental status will improve Outcome: Completed/Met   Problem: Activity: Goal: Interest or engagement in activities will improve Outcome: Completed/Met Goal: Sleeping patterns will improve Outcome: Completed/Met   Problem: Coping: Goal: Ability to verbalize frustrations and anger appropriately will improve Outcome: Completed/Met Goal: Ability to demonstrate self-control will improve Outcome: Completed/Met   Problem: Health Behavior/Discharge Planning: Goal: Identification of resources available to assist in meeting health care needs will improve Outcome: Completed/Met Goal: Compliance with treatment plan for underlying cause of condition will improve Outcome: Completed/Met   Problem: Physical Regulation: Goal: Ability to maintain clinical measurements within normal limits will improve Outcome: Completed/Met   Problem: Safety: Goal: Periods of time without injury will increase Outcome: Completed/Met   Problem: Education: Goal: Ability to make informed decisions regarding treatment will improve Outcome: Completed/Met   Problem: Coping: Goal: Coping ability will improve Outcome: Completed/Met   Problem: Health Behavior/Discharge Planning: Goal: Identification of resources available to assist in meeting health care needs will improve Outcome: Completed/Met   Problem: Medication: Goal: Compliance with prescribed medication regimen will improve Outcome: Completed/Met   Problem: Self-Concept: Goal: Ability to disclose and discuss suicidal ideas will improve Outcome: Completed/Met Goal: Will verbalize positive feelings about self Outcome: Completed/Met   Problem: Education: Goal: Utilization of techniques to improve thought processes will improve Outcome: Completed/Met Goal: Knowledge of the prescribed therapeutic regimen will improve Outcome: Completed/Met   Problem:  Activity: Goal: Interest or engagement in leisure activities will improve Outcome: Completed/Met Goal: Imbalance in normal sleep/wake cycle will improve Outcome: Completed/Met   Problem: Coping: Goal: Coping ability will improve Outcome: Completed/Met Goal: Will verbalize feelings Outcome: Completed/Met   Problem: Health Behavior/Discharge Planning: Goal: Ability to make decisions will improve Outcome: Completed/Met Goal: Compliance with therapeutic regimen will improve Outcome: Completed/Met   Problem: Role Relationship: Goal: Will demonstrate positive changes in social behaviors and relationships Outcome: Completed/Met   Problem: Safety: Goal: Ability to disclose and discuss suicidal ideas will improve Outcome: Completed/Met Goal: Ability to identify and utilize support systems that promote safety will improve Outcome: Completed/Met   Problem: Self-Concept: Goal: Will verbalize positive feelings about self Outcome: Completed/Met Goal: Level of anxiety will decrease Outcome: Completed/Met   Problem: Activity: Goal: Will verbalize the importance of balancing activity with adequate rest periods Outcome: Completed/Met   Problem: Education: Goal: Will be free of psychotic symptoms Outcome: Completed/Met Goal: Knowledge of the prescribed therapeutic regimen will improve Outcome: Completed/Met   Problem: Coping: Goal: Coping ability will improve Outcome: Completed/Met Goal: Will verbalize feelings Outcome: Completed/Met   Problem: Health Behavior/Discharge Planning: Goal: Compliance with prescribed medication regimen will improve Outcome: Completed/Met   Problem: Nutritional: Goal: Ability to achieve adequate nutritional intake will improve Outcome: Completed/Met   Problem: Role Relationship: Goal: Ability to communicate needs accurately will improve Outcome: Completed/Met Goal: Ability to interact with others will improve Outcome: Completed/Met   Problem:  Safety: Goal: Ability to redirect hostility and anger into socially appropriate behaviors will improve Outcome: Completed/Met Goal: Ability to remain free from injury will improve Outcome: Completed/Met   Problem: Self-Care: Goal: Ability to participate in self-care as condition permits will improve Outcome: Completed/Met   Problem: Self-Concept: Goal: Will verbalize positive feelings about self Outcome: Completed/Met   Problem: Education: Goal: Knowledge of General Education information will improve Outcome: Completed/Met   Problem: Health Behavior/Discharge Planning: Goal: Ability to manage health-related needs will improve Outcome: Completed/Met   Problem: Coping: Goal: Level of anxiety will decrease Outcome: Completed/Met  Problem: Safety: Goal: Ability to remain free from injury will improve Outcome: Completed/Met

## 2018-03-04 NOTE — [facility] Suicide Risk Assessment (Signed)
[facility]   Principal Problem: Schizoaffective disorder [facility](HCC) Discharge Diagnoses:  Patient Active Problem List   Diagnosis Date Noted  . Diarrhea due to protozoan [A07.9]   . Giardial enteritis [A07.1]   . Norovirus [A08.11]   . MDD (major depressive disorder) [F32.9] 02/26/2018  . Suicide attempt (HCC) [T14.91XA]   . Depression [F32.9] 11/08/2017  . Chest tightness [R07.89]   . Cough [R05]   . Leukocytosis [D72.829]   . SOB (shortness of breath) [R06.02]   . Nausea vomiting and diarrhea [R11.2, R19.7]   . Asthma exacerbation [J45.901] 08/15/2017  . Elevated LFTs [R94.5] 08/12/2017  . 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 Herpes zoster [B02.9] 03/06/2017  . Polysubstance abuse (HCC) [F19.10] 03/06/2017  . Homelessness [Z59.0] 03/06/2017  . Schizoaffective disorder (HCC) [F25.9] 12/23/2016  . Suicidal ideation [R45.851]   . Cannabis use disorder, moderate, dependence (HCC) [F12.20] 07/27/2016  . Tobacco user [Z72.0] 07/27/2016  . Intentional drug overdose (HCC) [T50.902A] 07/18/2016  . Asthma [J45.909] 05/20/2007  . HIV (human immunodeficiency virus infection) (HCC) [B20] 05/05/2007    Total Time spent with patient: 30 minutes  Musculoskeletal: Strength & Muscle Tone: within normal limits Gait & Station: normal Patient leans: N/A  Psychiatric Specialty Exam: Review of Systems  All other systems reviewed and are negative.   Blood pressure 114/90, pulse 86, temperature 98.2 F (36.8 C), temperature source Oral, resp. rate 20, height 5\' 7"  (1.702 m), weight 74.8 kg (165 lb).Body mass index is 25.84 kg/m.  General Appearance: Casual  Eye Contact::  Fair  Speech:  Normal Rate409  Volume:  Normal  Mood:  Euthymic  Affect:  Appropriate  Thought Process:  Coherent  Orientation:  Full (Time, Place, and Person)  Thought Content:  Logical  Suicidal Thoughts:  No  Homicidal Thoughts:  No  Memory:   Immediate;   Fair Recent;   Fair Remote;   Fair  Judgement:  Intact  Insight:  Fair  Psychomotor Activity:  Normal  Concentration:  Fair  Recall:  FiservFair  Fund of Knowledge:Fair  Language: Good  Akathisia:  Negative  Handed:  Right  AIMS (if indicated):     Assets:  Communication Skills Desire for Improvement Housing Resilience Social Support  Sleep:  Number of Hours: 6.75  Cognition: WNL  ADL's:  Intact   Mental Status Per Nursing Assessment::   On Admission:  Suicidal ideation indicated by patient, Suicide plan, Plan includes specific time, place, or method, Self-harm thoughts  Demographic Factors:  Male and Cardell PeachGay, lesbian, or bisexual orientation  Loss Factors: Decline in physical health  Historical Factors: Impulsivity  Risk Reduction Factors:   Positive social support  Continued Clinical Symptoms:  Depression:   Impulsivity  Cognitive Features That Contribute To Risk:  None    Suicide Risk:  Minimal: No identifiable suicidal ideation.  Patients presenting with no risk factors but with morbid ruminations; may be classified as minimal risk based on the severity of the depressive symptoms  Follow-up Information    [name] on 03/07/2018.   Specialty:  Behavioral Health Why:  Appointment is Monday, 03/07/18 at 12:45pm. Please be sure to bring your Photo ID, medication list and any discharge paperwork from the hospital.  Contact information: [address] [place] [address] [phone]           Plan Of Care/Follow-up recommendations:  Activity:  ad lib  Antonieta PertGreg Lawson Marlena Barbato, MD 03/04/2018, 7:35 AM

## 2018-04-06 ENCOUNTER — Other Ambulatory Visit: Payer: Self-pay

## 2018-04-06 DIAGNOSIS — B20 Human immunodeficiency virus [HIV] disease: Secondary | ICD-10-CM

## 2018-04-07 LAB — COMPREHENSIVE METABOLIC PANEL
AG Ratio: 1.8 (calc) (ref 1.0–2.5)
ALT: 121 U/L — AB (ref 9–46)
AST: 73 U/L — AB (ref 10–40)
Albumin: 4.6 g/dL (ref 3.6–5.1)
Alkaline phosphatase (APISO): 79 U/L (ref 40–115)
BUN: 14 mg/dL (ref 7–25)
CO2: 24 mmol/L (ref 20–32)
CREATININE: 1.08 mg/dL (ref 0.60–1.35)
Calcium: 9.6 mg/dL (ref 8.6–10.3)
Chloride: 105 mmol/L (ref 98–110)
GLUCOSE: 112 mg/dL — AB (ref 65–99)
Globulin: 2.6 g/dL (calc) (ref 1.9–3.7)
Potassium: 3.7 mmol/L (ref 3.5–5.3)
Sodium: 138 mmol/L (ref 135–146)
Total Bilirubin: 0.8 mg/dL (ref 0.2–1.2)
Total Protein: 7.2 g/dL (ref 6.1–8.1)

## 2018-04-07 LAB — CBC WITH DIFFERENTIAL/PLATELET
Basophils Absolute: 60 cells/uL (ref 0–200)
Basophils Relative: 0.7 %
Eosinophils Absolute: 198 cells/uL (ref 15–500)
Eosinophils Relative: 2.3 %
HCT: 47.5 % (ref 38.5–50.0)
HEMOGLOBIN: 16 g/dL (ref 13.2–17.1)
Lymphs Abs: 3268 cells/uL (ref 850–3900)
MCH: 28.4 pg (ref 27.0–33.0)
MCHC: 33.7 g/dL (ref 32.0–36.0)
MCV: 84.4 fL (ref 80.0–100.0)
MPV: 10.4 fL (ref 7.5–12.5)
Monocytes Relative: 5 %
NEUTROS ABS: 4644 {cells}/uL (ref 1500–7800)
NEUTROS PCT: 54 %
PLATELETS: 288 10*3/uL (ref 140–400)
RBC: 5.63 10*6/uL (ref 4.20–5.80)
RDW: 13.4 % (ref 11.0–15.0)
Total Lymphocyte: 38 %
WBC: 8.6 10*3/uL (ref 3.8–10.8)
WBCMIX: 430 {cells}/uL (ref 200–950)

## 2018-04-07 LAB — URINE CYTOLOGY ANCILLARY ONLY
Chlamydia: NEGATIVE
NEISSERIA GONORRHEA: NEGATIVE

## 2018-04-07 LAB — LIPID PANEL
Cholesterol: 140 mg/dL (ref <200)
HDL: 50 mg/dL (ref >40)
LDL CHOLESTEROL (CALC): 72 mg/dL
Non-HDL Cholesterol (Calc): 90 mg/dL (calc) (ref <130)
Total CHOL/HDL Ratio: 2.8 (calc) (ref <5.0)
Triglycerides: 94 mg/dL (ref <150)

## 2018-04-07 LAB — T-HELPER CELL (CD4) - (RCID CLINIC ONLY)
CD4 T CELL ABS: 1560 /uL (ref 400–2700)
CD4 T CELL HELPER: 44 % (ref 33–55)

## 2018-04-07 LAB — RPR: RPR: NONREACTIVE

## 2018-04-08 LAB — HIV-1 RNA QUANT-NO REFLEX-BLD
HIV 1 RNA QUANT: 45 {copies}/mL — AB
HIV-1 RNA Quant, Log: 1.65 Log copies/mL — ABNORMAL HIGH

## 2018-04-13 ENCOUNTER — Telehealth: Payer: Self-pay | Admitting: Infectious Diseases

## 2018-04-13 NOTE — Telephone Encounter (Signed)
-----   Message from Blanchard KelchStephanie N Dixon, NP sent at 04/12/2018 11:45 AM EDT ----- Claris CheMargaret - can you please get [name] a follow up appointment with pharmacy team or [name]? He has had many no shows but had labs last week and no follow up appointment on file. Thank you!

## 2018-04-26 ENCOUNTER — Emergency Department (HOSPITAL_COMMUNITY): Payer: Self-pay

## 2018-04-26 ENCOUNTER — Emergency Department (HOSPITAL_COMMUNITY)
Admission: EM | Admit: 2018-04-26 | Discharge: 2018-04-26 | Disposition: A | Discharge status: Home / Self Care | Payer: Self-pay | Attending: Emergency Medicine | Admitting: Emergency Medicine

## 2018-04-26 ENCOUNTER — Encounter (HOSPITAL_COMMUNITY): Payer: Self-pay | Admitting: Emergency Medicine

## 2018-04-26 ENCOUNTER — Other Ambulatory Visit: Payer: Self-pay

## 2018-04-26 DIAGNOSIS — F17210 Nicotine dependence, cigarettes, uncomplicated: Secondary | ICD-10-CM | POA: Insufficient documentation

## 2018-04-26 DIAGNOSIS — J4521 Mild intermittent asthma with (acute) exacerbation: Secondary | ICD-10-CM | POA: Insufficient documentation

## 2018-04-26 DIAGNOSIS — Z79899 Other long term (current) drug therapy: Secondary | ICD-10-CM | POA: Insufficient documentation

## 2018-04-26 DIAGNOSIS — I10 Essential (primary) hypertension: Secondary | ICD-10-CM | POA: Insufficient documentation

## 2018-04-26 MED ORDER — PREDNISONE 50 MG PO TABS: ORAL_TABLET | ORAL | 0 refills | Status: DC

## 2018-04-26 MED ORDER — ALBUTEROL SULFATE HFA 108 (90 BASE) MCG/ACT IN AERS
2.0000 | INHALATION_SPRAY | RESPIRATORY_TRACT | Status: DC | PRN
  Administered 2018-04-26: 2 via RESPIRATORY_TRACT

## 2018-04-26 MED ORDER — ALBUTEROL SULFATE (2.5 MG/3ML) 0.083% IN NEBU: 2.5000 mg | INHALATION_SOLUTION | Freq: Once | RESPIRATORY_TRACT | Status: DC

## 2018-04-26 MED ORDER — ALBUTEROL SULFATE (2.5 MG/3ML) 0.083% IN NEBU
5.0000 mg | INHALATION_SOLUTION | Freq: Once | RESPIRATORY_TRACT | Status: AC
  Administered 2018-04-26: 5 mg via RESPIRATORY_TRACT
  Filled 2018-04-26: qty 6

## 2018-04-26 MED ORDER — PREDNISONE 50 MG PO TABS
60.0000 mg | ORAL_TABLET | Freq: Once | ORAL | Status: AC
  Administered 2018-04-26: 60 mg via ORAL
  Filled 2018-04-26: qty 1

## 2018-04-26 MED ORDER — IPRATROPIUM-ALBUTEROL 0.5-2.5 (3) MG/3ML IN SOLN: 3.0000 mL | Freq: Once | RESPIRATORY_TRACT | Status: DC

## 2018-04-26 NOTE — ED Triage Notes (Signed)
Patient complaining of cough and shortness of breath x 2 days.  

## 2018-04-26 NOTE — ED Notes (Signed)
Patient ambulated and maintaining oxygen saturations of 100.

## 2018-04-26 NOTE — ED Provider Notes (Signed)
   1730 patient signed out to me by Burgess AmorJulie Idol, PA-C at end of shift.  Patient with history of asthma came to emergency department with wheezing, shortness of breath and cough for 2 days.  Has received oral prednisone and albuterol neb x1 with minimal improvement, patient receiving second albuterol nebulizer treatment.  1750 albuterol nebulizer treatment completed, patient reports feeling much better.  Lung sounds clear to auscultation bilaterally.  O2 sat 100% with ambulation.  Patient agrees to treatment plan with discharge home, prescription for prednisone and albuterol MDI dispensed.  Return precautions discussed.   Pauline Ausriplett, Jacie Tristan, PA-C 04/26/18 Loreli [id number]    Pickering, Nathan, MD 04/27/18 [phone]

## 2018-04-26 NOTE — ED Notes (Signed)
Respiratory called for nebulizer treatment.

## 2018-04-26 NOTE — ED Notes (Signed)
Respiratory notified for nebulizer treatment.  

## 2018-04-26 NOTE — ED Provider Notes (Signed)
[facility] Provider Note   CSN: [id number] Arrival date & time: 04/26/18  1439     History   Chief Complaint Chief Complaint  Patient presents with  . Cough    HPI Edwin Martinez is a 32 y.o. male with a history as outlined below, most significant for asthma (reports infrequent flares) and HIV, last CD4 count 1560 and viral load 45 per labs collected 7/10, with a 2 day history of cough, shortness of breath and wheezing.  He has had no fevers, chills, cough has been nonproductive.He also denies chest pain, headache, n/v or abdominal pain, no weakness or fatigue.  He does endorse clear rhinorrhea and nasal congestion as well.  He has had no treatment prior to arrival, has run out of his albuterol inhaler.   The history is provided by the patient.    Past Medical History:  Diagnosis Date  . ADD (attention deficit disorder)   . Anxiety   . Asthma   . Bipolar 1 disorder (HCC)   . Depression   . HIV (human immunodeficiency virus infection) (HCC) dx'd 2008  . Hypertension   . Insomnia   . Intentional drug overdose (HCC)    [name]/notes 02/25/2018  . Polysubstance abuse (HCC)    [name]/notes 02/25/2018  . Schizophrenia (HCC)   . Seizures (HCC)    "used to have little black-out szs where I'd drop out for 2-3 min then come back; nothing in the last 2-3-4years" (02/25/2018)  . Shingles     Patient Active Problem List   Diagnosis Date Noted  . Diarrhea due to protozoan   . Giardial enteritis   . Norovirus   . MDD (major depressive disorder) 02/26/2018  . Suicide attempt (HCC)   . Depression 11/08/2017  . Chest tightness   . Cough   . Leukocytosis   . SOB (shortness of breath)   . Nausea vomiting and diarrhea   . Asthma exacerbation 08/15/2017  . Elevated LFTs 08/12/2017  . Cocaine abuse with cocaine-induced mood disorder (HCC) 03/30/2017  . Herpes zoster 03/06/2017  . Polysubstance abuse (HCC) 03/06/2017  . Homelessness 03/06/2017  . Schizoaffective  disorder (HCC) 12/23/2016  . Suicidal ideation   . Cannabis use disorder, moderate, dependence (HCC) 07/27/2016  . Tobacco user 07/27/2016  . Intentional drug overdose (HCC) 07/18/2016  . Asthma 05/20/2007  . HIV (human immunodeficiency virus infection) (HCC) 05/05/2007    Past Surgical History:  Procedure Laterality Date  . DENTAL SURGERY     "had my eye teeth pulled down"        Home Medications    Prior to Admission medications   Medication Sig Start Date End Date Taking? Authorizing Provider  albuterol (PROVENTIL HFA;VENTOLIN HFA) 108 (90 Base) MCG/ACT inhaler Inhale 2 puffs into the lungs every 6 (six) hours as needed for wheezing or shortness of breath. 03/04/18   Money, Gerlene Burdockravis B, FNP  benztropine (COGENTIN) 0.5 MG tablet Take 1 tablet (0.5 mg total) by mouth at bedtime. 03/04/18   Money, Gerlene Burdockravis B, FNP  bictegravir-emtricitabine-tenofovir AF (BIKTARVY) 50-200-25 MG TABS tablet Take 1 tablet by mouth daily. 11/08/17   Judyann MunsonSnider, Cynthia, MD  gabapentin (NEURONTIN) 300 MG capsule Take 1 capsule (300 mg total) by mouth 3 (three) times daily. For pain 03/04/18   Money, Gerlene Burdockravis B, FNP  hydrOXYzine (ATARAX/VISTARIL) 25 MG tablet Take 1 tablet (25 mg total) by mouth 3 (three) times daily as needed for anxiety or nausea. 03/04/18   Money, Gerlene Burdockravis B, FNP  metroNIDAZOLE (  FLAGYL) 500 MG tablet Take 1 tablet (500 mg total) by mouth every 8 (eight) hours. For infection 03/04/18   Money, Gerlene Burdock, FNP  predniSONE (DELTASONE) 50 MG tablet Take one tablet daily for 4 days. 04/26/18   Burgess Amor, PA-C  risperiDONE (RISPERDAL) 1 MG tablet Take 1 tablet (1 mg total) by mouth 2 (two) times daily. For mood control 03/04/18   Money, Gerlene Burdock, FNP  traZODone (DESYREL) 50 MG tablet Take 1 tablet (50 mg total) by mouth at bedtime as needed for sleep. 03/04/18   Money, Gerlene Burdock, FNP    Family History Family History  Problem Relation Age of Onset  . Huntington's disease Father   . Heart disease Mother   . Suicidality  Maternal Uncle   . Suicidality Maternal Grandmother     Social History Social History   Tobacco Use  . Smoking status: Current Every Day Smoker    Packs/day: 0.50    Years: 20.00    Pack years: 10.00    Types: Cigarettes    Start date: 09/29/1991  . Smokeless tobacco: Never Used  Substance Use Topics  . Alcohol use: Yes    Alcohol/week: 2.4 oz    Types: 4 Cans of beer per week    Comment: occasionally  . Drug use: Yes    Types: Marijuana, Cocaine    Comment: 02/25/2018 "nothing in years"     Allergies   Magnesium-containing compounds; Peanut-containing drug products; Esomeprazole magnesium; Atripla [efavirenz-emtricitab-tenofovir]; Atripla [efavirenz-emtricitab-tenofovir]; Bactrim [sulfamethoxazole-trimethoprim]; Magnesium-containing compounds; Nexium [esomeprazole magnesium]; Peanut-containing drug products; Penicillins; Bactrim [sulfamethoxazole-trimethoprim]; and Penicillins   Review of Systems Review of Systems  Constitutional: Negative for chills and fever.  HENT: Positive for congestion and rhinorrhea. Negative for ear pain, sinus pressure, sore throat, trouble swallowing and voice change.   Eyes: Negative for discharge.  Respiratory: Positive for cough, shortness of breath and wheezing. Negative for stridor.   Cardiovascular: Negative for chest pain.  Gastrointestinal: Negative for abdominal pain.  Genitourinary: Negative.      Physical Exam Updated Vital Signs Pulse 86   Temp 98.6 F (37 C) (Oral)   Resp 18   Ht 5\' 7"  (1.702 m)   Wt 74.8 kg (165 lb)   SpO2 96%   BMI 25.84 kg/m   Physical Exam  Constitutional: He is oriented to person, place, and time. He appears well-developed and well-nourished.  HENT:  Head: Normocephalic and atraumatic.  Right Ear: Tympanic membrane and ear canal normal.  Left Ear: Tympanic membrane and ear canal normal.  Nose: Mucosal edema and rhinorrhea present.  Mouth/Throat: Uvula is midline, oropharynx is clear and moist and  mucous membranes are normal. No oropharyngeal exudate, posterior oropharyngeal edema, posterior oropharyngeal erythema or tonsillar abscesses.  Eyes: Conjunctivae are normal.  Cardiovascular: Normal rate and normal heart sounds.  Pulmonary/Chest: Effort normal. No stridor. No respiratory distress. He has wheezes. He has no rhonchi. He has no rales.  Wheezing throughout all lung fields with prolonged expirations.   Musculoskeletal: Normal range of motion.  Neurological: He is alert and oriented to person, place, and time.  Skin: Skin is warm and dry. No rash noted.  Psychiatric: He has a normal mood and affect.     ED Treatments / Results  Labs (all labs ordered are listed, but only abnormal results are displayed) Labs Reviewed - No data to display  EKG None  Radiology Dg Chest 2 View  Result Date: 04/26/2018 CLINICAL DATA:  Shortness of breath and cough for 2 days EXAM:  CHEST - 2 VIEW COMPARISON:  08/24/2017 FINDINGS: The heart size and mediastinal contours are within normal limits. Both lungs are clear. The visualized skeletal structures are unremarkable. IMPRESSION: No active cardiopulmonary disease. Electronically Signed   By: Alcide Clever M.D.   On: 04/26/2018 15:15    Procedures Procedures (including critical care time)  Medications Ordered in ED Medications  albuterol (PROVENTIL HFA;VENTOLIN HFA) 108 (90 Base) MCG/ACT inhaler 2 puff (2 puffs Inhalation Given 04/26/18 1638)  albuterol (PROVENTIL) (2.5 MG/3ML) 0.083% nebulizer solution 5 mg (has no administration in time range)  predniSONE (DELTASONE) tablet 60 mg (has no administration in time range)  albuterol (PROVENTIL) (2.5 MG/3ML) 0.083% nebulizer solution 5 mg (5 mg Nebulization Given 04/26/18 1628)     Initial Impression / Assessment and Plan / ED Course  I have reviewed the triage vital signs and the nursing notes.  Pertinent labs & imaging results that were available during my care of the patient were reviewed by  me and considered in my medical decision making (see chart for details).     Pt with peanut allergy so given albuterol only neb x 1 - improved aeration and reduced cough, but still with expiratory wheeze throughout.  Repeated x 1.  Prednisone 60 mg given as well. Pt given albuterol mdi with spacer for home use.  Discussed with Pauline Aus, PA who assumes care and dc home after 2nd neb if wheezing improved.   Final Clinical Impressions(s) / ED Diagnoses   Final diagnoses:  Exacerbation of intermittent asthma, unspecified asthma severity    ED Discharge Orders        Ordered    predniSONE (DELTASONE) 50 MG tablet     04/26/18 1713       Burgess Amor, PA-C 04/26/18 1727    Gerhard Munch, MD 04/27/18 [phone]

## 2018-04-26 NOTE — Progress Notes (Signed)
**Note De-Identified  Obfuscation** Inspiratory flow measurement for MDI delivery adequate and WNL

## 2018-04-26 NOTE — Discharge Instructions (Addendum)
Use the inhaler - 2 puffs every 4 hours if you are coughing, wheezing or short of breath.  Take your next dose of the prednisone tomorrow evening.  Get rechecked by your doctor for any worsening or persistent symptoms, especially worsened shortness of breath or any development of fever.

## 2018-04-28 ENCOUNTER — Encounter: Payer: Self-pay | Admitting: Infectious Diseases

## 2018-05-12 ENCOUNTER — Telehealth: Payer: Self-pay

## 2018-05-12 NOTE — Telephone Encounter (Signed)
Patient called today for lab results that were done on 7/10. Informed patient that he needs to schedule an appointment to see [name], and that labs will be discussed during his appointment. Patient is scheduled to see [name] on 9/4. Patient will call office if he needs to reschedule appointment. Lorenso CourierJose L Karlyn Glasco, [place]

## 2018-06-01 ENCOUNTER — Ambulatory Visit: Payer: Self-pay | Admitting: Internal Medicine

## 2018-06-19 ENCOUNTER — Emergency Department (HOSPITAL_COMMUNITY)
Admission: EM | Admit: 2018-06-19 | Discharge: 2018-06-20 | Disposition: A | Discharge status: Home / Self Care | Payer: Self-pay | Attending: Emergency Medicine | Admitting: Emergency Medicine

## 2018-06-19 ENCOUNTER — Emergency Department (HOSPITAL_COMMUNITY): Payer: Self-pay

## 2018-06-19 ENCOUNTER — Other Ambulatory Visit: Payer: Self-pay

## 2018-06-19 DIAGNOSIS — H5789 Other specified disorders of eye and adnexa: Secondary | ICD-10-CM

## 2018-06-19 DIAGNOSIS — F1414 Cocaine abuse with cocaine-induced mood disorder: Secondary | ICD-10-CM | POA: Diagnosis present

## 2018-06-19 DIAGNOSIS — F315 Bipolar disorder, current episode depressed, severe, with psychotic features: Secondary | ICD-10-CM | POA: Insufficient documentation

## 2018-06-19 DIAGNOSIS — Z91010 Allergy to peanuts: Secondary | ICD-10-CM | POA: Insufficient documentation

## 2018-06-19 DIAGNOSIS — B20 Human immunodeficiency virus [HIV] disease: Secondary | ICD-10-CM | POA: Insufficient documentation

## 2018-06-19 DIAGNOSIS — H02843 Edema of right eye, unspecified eyelid: Secondary | ICD-10-CM | POA: Insufficient documentation

## 2018-06-19 DIAGNOSIS — R45851 Suicidal ideations: Secondary | ICD-10-CM | POA: Insufficient documentation

## 2018-06-19 DIAGNOSIS — F17210 Nicotine dependence, cigarettes, uncomplicated: Secondary | ICD-10-CM | POA: Insufficient documentation

## 2018-06-19 DIAGNOSIS — I10 Essential (primary) hypertension: Secondary | ICD-10-CM | POA: Insufficient documentation

## 2018-06-19 DIAGNOSIS — J45909 Unspecified asthma, uncomplicated: Secondary | ICD-10-CM | POA: Insufficient documentation

## 2018-06-19 DIAGNOSIS — Z79899 Other long term (current) drug therapy: Secondary | ICD-10-CM | POA: Insufficient documentation

## 2018-06-19 LAB — COMPREHENSIVE METABOLIC PANEL
ALK PHOS: 64 U/L (ref 38–126)
ALT: 88 U/L — AB (ref 0–44)
AST: 74 U/L — ABNORMAL HIGH (ref 15–41)
Albumin: 4.1 g/dL (ref 3.5–5.0)
Anion gap: 11 (ref 5–15)
BUN: 6 mg/dL (ref 6–20)
CALCIUM: 9.3 mg/dL (ref 8.9–10.3)
CO2: 21 mmol/L — ABNORMAL LOW (ref 22–32)
Chloride: 106 mmol/L (ref 98–111)
Creatinine, Ser: 1.05 mg/dL (ref 0.61–1.24)
Glucose, Bld: 103 mg/dL — ABNORMAL HIGH (ref 70–99)
Potassium: 3.7 mmol/L (ref 3.5–5.1)
Sodium: 138 mmol/L (ref 135–145)
Total Bilirubin: 0.8 mg/dL (ref 0.3–1.2)
Total Protein: 7.3 g/dL (ref 6.5–8.1)

## 2018-06-19 LAB — ACETAMINOPHEN LEVEL: Acetaminophen (Tylenol), Serum: <10 ug/mL — ABNORMAL LOW (ref 10–30)

## 2018-06-19 LAB — CBC
HCT: 46.8 % (ref 39.0–52.0)
HEMOGLOBIN: 15 g/dL (ref 13.0–17.0)
MCH: 28.5 pg (ref 26.0–34.0)
MCHC: 32.1 g/dL (ref 30.0–36.0)
MCV: 88.8 fL (ref 78.0–100.0)
PLATELETS: 288 10*3/uL (ref 150–400)
RBC: 5.27 MIL/uL (ref 4.22–5.81)
RDW: 13.6 % (ref 11.5–15.5)
WBC: 8.1 10*3/uL (ref 4.0–10.5)

## 2018-06-19 LAB — RAPID URINE DRUG SCREEN, HOSP PERFORMED
Amphetamines: NOT DETECTED
Barbiturates: NOT DETECTED
Benzodiazepines: NOT DETECTED
Cocaine: POSITIVE — AB
OPIATES: NOT DETECTED
TETRAHYDROCANNABINOL: NOT DETECTED

## 2018-06-19 LAB — ETHANOL

## 2018-06-19 LAB — SALICYLATE LEVEL

## 2018-06-19 MED ORDER — GABAPENTIN 300 MG PO CAPS
300.0000 mg | ORAL_CAPSULE | Freq: Three times a day (TID) | ORAL | Status: DC
  Administered 2018-06-19 – 2018-06-20 (×2): 300 mg via ORAL
  Filled 2018-06-19 (×2): qty 1

## 2018-06-19 MED ORDER — ALBUTEROL SULFATE HFA 108 (90 BASE) MCG/ACT IN AERS: 2.0000 | INHALATION_SPRAY | Freq: Four times a day (QID) | RESPIRATORY_TRACT | Status: DC | PRN

## 2018-06-19 MED ORDER — BUSPIRONE HCL 10 MG PO TABS
10.0000 mg | ORAL_TABLET | Freq: Two times a day (BID) | ORAL | Status: DC
  Administered 2018-06-19 – 2018-06-20 (×3): 10 mg via ORAL
  Filled 2018-06-19 (×3): qty 1

## 2018-06-19 MED ORDER — FLUORESCEIN SODIUM 1 MG OP STRP
1.0000 | ORAL_STRIP | Freq: Once | OPHTHALMIC | Status: AC
  Administered 2018-06-19: 1 via OPHTHALMIC
  Filled 2018-06-19: qty 1

## 2018-06-19 MED ORDER — TETRACAINE HCL 0.5 % OP SOLN
2.0000 [drp] | Freq: Once | OPHTHALMIC | Status: AC
  Administered 2018-06-19: 2 [drp] via OPHTHALMIC
  Filled 2018-06-19: qty 4

## 2018-06-19 MED ORDER — BICTEGRAVIR-EMTRICITAB-TENOFOV 50-200-25 MG PO TABS
1.0000 | ORAL_TABLET | Freq: Every day | ORAL | Status: DC
  Administered 2018-06-19 – 2018-06-20 (×2): 1 via ORAL
  Filled 2018-06-19 (×2): qty 1

## 2018-06-19 MED ORDER — HYDROXYZINE HCL 25 MG PO TABS: 25.0000 mg | ORAL_TABLET | Freq: Three times a day (TID) | ORAL | Status: DC | PRN

## 2018-06-19 MED ORDER — CITALOPRAM HYDROBROMIDE 10 MG PO TABS
20.0000 mg | ORAL_TABLET | Freq: Every day | ORAL | Status: DC
  Administered 2018-06-19 – 2018-06-20 (×2): 20 mg via ORAL
  Filled 2018-06-19 (×2): qty 2

## 2018-06-19 MED ORDER — BENZTROPINE MESYLATE 1 MG PO TABS
0.5000 mg | ORAL_TABLET | Freq: Every day | ORAL | Status: DC
  Administered 2018-06-19: 0.5 mg via ORAL
  Filled 2018-06-19: qty 1

## 2018-06-19 MED ORDER — TRAZODONE HCL 50 MG PO TABS
50.0000 mg | ORAL_TABLET | Freq: Every evening | ORAL | Status: DC | PRN
  Administered 2018-06-19: 50 mg via ORAL
  Filled 2018-06-19: qty 1

## 2018-06-19 MED ORDER — RISPERIDONE 1 MG PO TABS
1.0000 mg | ORAL_TABLET | Freq: Two times a day (BID) | ORAL | Status: DC
  Administered 2018-06-19 – 2018-06-20 (×3): 1 mg via ORAL
  Filled 2018-06-19 (×3): qty 1

## 2018-06-19 NOTE — ED Notes (Signed)
Dinner tray arrived 
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
## 2018-06-19 NOTE — ED Notes (Signed)
Pt placed in scrubs, security advised to wand pt.
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
## 2018-06-19 NOTE — ED Notes (Signed)
1 labeled belongings bag place in [place] - NO VALUABLES noted.

## 2018-06-19 NOTE — [facility] Counselor (Signed)
Per Tanika Lewis, NP, patient will be monHillery Jacksitored over night for safety and reassess in the morning.  Attempted to contact Dr. Lynelle DoctorKnapp at [phone] to advise of disposition, no answer.  Contacted patient's nurse, Jeanice LimHolly, and informed of disposition.

## 2018-06-19 NOTE — ED Notes (Signed)
Pt awake now - ate dinner.

## 2018-06-19 NOTE — Discharge Instructions (Addendum)
Please follow-up with the ophthalmologist if your symptoms of blurry vision continue.  You you would probably benefit from eyeglasses, and you can see an optometrist for this.  Substance Abuse Treatment Programs  Intensive Outpatient Programs [facility]     [address]      [place], [place]                   [phone]       [facility] [address] [place], [place] [phone]  [facility]     (Inpatient and outpatient)     [address]           [phone]    [facility] [phone] (Suboxone and Methadone)  [address]      [place], [place] [address]      [phone]       [address] [place], [place] [phone]  [facility] (Outpatient/Inpatient, [facility])    (insurance only) [phone]             [facility] (Groups & Residential) [place], [place] [phone]     [facility]     [address]     [place], [place]      [phone]       [facility] (for caregivers and family) [phone] [place], [place] [phone]      Residential Treatment Programs [facility]      [address], [place], [place] [address]  [phone]       [facility] [address] [place], [place] [address] [phone]  [facility]        [phone]       [facility] [phone]  [facility] ([facility]             [address]                                         [place], [place]                                                [phone] or [phone]                               [facility] [place] [address] [place], [id number] [phone]  [facility]    [address]      [place], [place]     [phone]       [facility] [address] [place], [place] [phone]  [facility]   [address]     [place], [place] [address]     [phone]      Admissions: 8am-3pm M-F  [facility] ([facility]) [address] [place], [place] [phone]  [facility]: Residential Program [phone](90 Days)   [place], [place]      [phone] or [phone]     [facility]: [facility] [place], [place] (Walk in Hours over the weekend or by referral)  [facility] [address] [place], [place], [place] [address] [phone]  Crisis Mobile:  [facility]:  [phone] (for crisis response 24 hours a day) [facility] Hotline:      [phone] Outpatient Psychiatry and Counseling  [facility]: Mobile Crisis Management 24 hours:  [phone]  [facility] of the [employer] sliding scale fee and walk in schedule: M-F 8am-12pm/1pm-3pm [place], [place] [address] [address] [place], [place] [address] [phone]  [facility] ([facility] [employer])- new patient walk-in appointments available Monday - Friday 8am -3pm.          [address] [place], [place] [address] [phone] or crisis line- [facility]/ Intensive Outpatient Therapy Program [place], [place] [address] [place]      [phone] [address] [place], [place] [address]                 [place]   [facility] [phone]. [place], [place] [address]   [employer] of Care          [address] Johnette Abraham  [place], [place] [address]       [phone]  [facility] [address], [place], [place] [address] [phone]  [facility]    [address] [place], [place]  [address]     [place], Mazie Joycelyn Man     [place] [address]     [phone]       [facility] [place] [address]  [facility]     [address] [place], [place], [place] [address] [place]     [address]     [place], [place] [address]     [phone]       [facility] [address] [place], [place] [address] [phone] Resources for Temporary Residential Assistance/Crisis [facility]) M-F 8am-3pm   [address] [place], [place] [address]   [phone] Services include: laundry, barbering, support groups, case management, phone  & computer access, showers, AA/NA mtgs, mental health/substance abuse nurse, job skills class, disability information, VA assistance, spiritual classes, etc.   [facility]   [address], [place]     [place]              [employer] (women and children)       [place]. [place], [place] [address] [phone] [email or web address] for application and process Application Required  [facility]   [address]    [place] [address]     [phone]                    [place] [address] [place],  [address] [phone](OINOMVEH application appt.) Application Required  [employer] (women only)    [place]     [place], [place] [address]     [phone]      Intake starts 6pm daily Need valid ID, SSC, & Police report [employer] [address] [place], [place] [phone] Application Required  [employer] (men only)     [place].       [place], [place]       [place] (Pregnant women only) [address] [place], [place]  [facility]      [place] [address].      [place], [place] [address]     [phone]             [facility] [address] [place], [place] 90 day commitment/SA/Application process  [facility](men only)     [address]     [place], [place]       Check-in at [facility] of [facility] [address] [place], [place] [address] [phone] Men/Women/Women and Children must be there by 7 pm  [place], [place]
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
## 2018-06-19 NOTE — ED Notes (Signed)
Pt arrived to F7 wearing burgundy scrubs - ambulatory. Sitter w/pt.

## 2018-06-19 NOTE — ED Triage Notes (Signed)
Pt. Stated, Im having suicidal thoughts, my cousin just died, My medication is out, Im depressed, Lavenia Atlasve been using drugs to cope.  I use Crack and marijuana

## 2018-06-20 NOTE — ED Notes (Signed)
TTS machine at bedside. Patient given snack.

## 2018-06-20 NOTE — ED Notes (Signed)
Patient verbalizes understanding of discharge instructions. Opportunity for questioning and answers were provided. Armband removed by staff, pt discharged from ED.   Patient given PART pass and regular bus pass [place].

## 2018-06-20 NOTE — Progress Notes (Signed)
CSW [facility] informing [facility]W that pt is needing bus tickets [facility].CSW has provided pt with bus ticket to get to [place] and then another bus pass [facility]. There are no further CSW needs at this time-CSW signing off.   Claude MangesKierra S. Dujuan Stankowski, MSW, LCSW-A Emergency Department Clinical Social Worker [phone]

## 2018-06-20 NOTE — ED Notes (Signed)
Breakfast tray ordered 

## 2018-06-20 NOTE — ED Provider Notes (Signed)
Blood pressure (!) 101/50, pulse (!) 57, temperature 98.3 F (36.8 C), temperature source Oral, resp. rate 18, height 5\' 7"  (1.702 m), weight 74.8 kg, SpO2 97 %.  In short, Edwin Martinez is a 32 y.o. male with a chief complaint of Suicidal .  Refer to the original H&P for additional details.  The current plan of care is to f/u with TTS evaluation.   1:08 PM She has been evaluated by TTS.  He does not meet inpatient criteria.  [facility] [place] [facility] [place] [facility].  Patient to follow-up with ophthalmology of blurry vision continues.  No antibiotics or other medications at this time. Plan for discharge.   Alona BeneJoshua Long, MD    Maia PlanLong, Joshua G, MD 06/20/18 [phone]

## 2018-06-20 NOTE — ED Notes (Signed)
Regular Diet was ordered for Lunch. 

## 2018-06-20 NOTE — ED Notes (Signed)
Patient was given a snack and drink. 

## 2018-06-20 NOTE — Consult Note (Signed)
  Patient seen and chart is reviewed. He was seen with Dr. Lucianne MussKumar. Patient states "I'm depressed because my cousin died and I have been off my medications. I have been back on them for about a day. I really need help to stop using cocaine and with housing. I would [facility] if I can get help with transportation." Edwin Martinez was pleasant during the assessment but appeared depressed regarding social stressors. Patient was able to contract for his safety. Denied SI/HI. TTS counselor will contact [facility] social worker to provide assistance with transportation to [facility] [place] [facility]. Patient does not meet criteria for inpatient psychiatric hospitalization.

## 2018-06-20 NOTE — ED Notes (Signed)
Pt breakfast at bedside 

## 2018-07-20 ENCOUNTER — Telehealth: Payer: Self-pay | Admitting: *Deleted

## 2018-07-20 DIAGNOSIS — B20 Human immunodeficiency virus [HIV] disease: Secondary | ICD-10-CM

## 2018-07-20 NOTE — Telephone Encounter (Signed)
Received record request from [facility] for patient's labs to be faxed to [phone]. Sent as requested. Andree Coss, RN

## 2018-07-22 MED ORDER — BICTEGRAVIR-EMTRICITAB-TENOFOV 50-200-25 MG PO TABS: 1.0000 | ORAL_TABLET | Freq: Every day | ORAL | 0 refills | Status: DC

## 2018-07-22 NOTE — Addendum Note (Signed)
Addended by: Andree Coss on: 07/22/2018 05:18 PM   Modules accepted: Orders

## 2018-07-22 NOTE — Telephone Encounter (Signed)
Fax unable to be completed x 5 attempts. Will send one more time. Patietn requested 30 day prescription via Ladona Ridgel, [name].  OK per Dr Luciana Axe.  RN Sent to pharmacy. Andree Coss, RN

## 2018-07-25 ENCOUNTER — Telehealth: Payer: Self-pay | Admitting: *Deleted

## 2018-07-25 NOTE — Telephone Encounter (Signed)
Patient contacted [name] to let him know he has moved to [place], [place] and is transferring care. [facility] notified. Andree Coss, RN

## 2018-07-25 NOTE — Telephone Encounter (Signed)
Thank you for the update!

## 2018-08-20 ENCOUNTER — Other Ambulatory Visit: Payer: Self-pay

## 2018-08-20 ENCOUNTER — Encounter (HOSPITAL_COMMUNITY): Payer: Self-pay | Admitting: Emergency Medicine

## 2018-08-20 ENCOUNTER — Emergency Department (HOSPITAL_COMMUNITY): Payer: Self-pay

## 2018-08-20 ENCOUNTER — Emergency Department (HOSPITAL_COMMUNITY)
Admission: EM | Admit: 2018-08-20 | Discharge: 2018-08-20 | Disposition: A | Discharge status: Home / Self Care | Payer: Self-pay | Attending: Emergency Medicine | Admitting: Emergency Medicine

## 2018-08-20 DIAGNOSIS — R053 Chronic cough: Secondary | ICD-10-CM

## 2018-08-20 DIAGNOSIS — F1220 Cannabis dependence, uncomplicated: Secondary | ICD-10-CM | POA: Insufficient documentation

## 2018-08-20 DIAGNOSIS — I10 Essential (primary) hypertension: Secondary | ICD-10-CM | POA: Insufficient documentation

## 2018-08-20 DIAGNOSIS — Z21 Asymptomatic human immunodeficiency virus [HIV] infection status: Secondary | ICD-10-CM | POA: Insufficient documentation

## 2018-08-20 DIAGNOSIS — F17210 Nicotine dependence, cigarettes, uncomplicated: Secondary | ICD-10-CM | POA: Insufficient documentation

## 2018-08-20 DIAGNOSIS — F209 Schizophrenia, unspecified: Secondary | ICD-10-CM | POA: Insufficient documentation

## 2018-08-20 DIAGNOSIS — Z91010 Allergy to peanuts: Secondary | ICD-10-CM | POA: Insufficient documentation

## 2018-08-20 DIAGNOSIS — F1410 Cocaine abuse, uncomplicated: Secondary | ICD-10-CM | POA: Insufficient documentation

## 2018-08-20 DIAGNOSIS — L089 Local infection of the skin and subcutaneous tissue, unspecified: Secondary | ICD-10-CM

## 2018-08-20 DIAGNOSIS — Z590 Homelessness: Secondary | ICD-10-CM | POA: Insufficient documentation

## 2018-08-20 DIAGNOSIS — F909 Attention-deficit hyperactivity disorder, unspecified type: Secondary | ICD-10-CM | POA: Insufficient documentation

## 2018-08-20 DIAGNOSIS — Z79899 Other long term (current) drug therapy: Secondary | ICD-10-CM | POA: Insufficient documentation

## 2018-08-20 DIAGNOSIS — F319 Bipolar disorder, unspecified: Secondary | ICD-10-CM | POA: Insufficient documentation

## 2018-08-20 DIAGNOSIS — J4520 Mild intermittent asthma, uncomplicated: Secondary | ICD-10-CM

## 2018-08-20 DIAGNOSIS — R05 Cough: Secondary | ICD-10-CM

## 2018-08-20 MED ORDER — LORATADINE 10 MG PO TABS
10.0000 mg | ORAL_TABLET | Freq: Every day | ORAL | Status: DC
  Administered 2018-08-20: 10 mg via ORAL
  Filled 2018-08-20: qty 1

## 2018-08-20 MED ORDER — IPRATROPIUM-ALBUTEROL 0.5-2.5 (3) MG/3ML IN SOLN
3.0000 mL | Freq: Once | RESPIRATORY_TRACT | Status: AC
  Administered 2018-08-20: 3 mL via RESPIRATORY_TRACT
  Filled 2018-08-20: qty 3

## 2018-08-20 MED ORDER — ALBUTEROL SULFATE HFA 108 (90 BASE) MCG/ACT IN AERS
1.0000 | INHALATION_SPRAY | Freq: Once | RESPIRATORY_TRACT | Status: AC
  Administered 2018-08-20: 2 via RESPIRATORY_TRACT
  Filled 2018-08-20: qty 6.7

## 2018-08-20 MED ORDER — ALBUTEROL SULFATE (2.5 MG/3ML) 0.083% IN NEBU
2.5000 mg | INHALATION_SOLUTION | Freq: Once | RESPIRATORY_TRACT | Status: AC
  Administered 2018-08-20: 2.5 mg via RESPIRATORY_TRACT
  Filled 2018-08-20: qty 3

## 2018-08-20 MED ORDER — CEPHALEXIN 500 MG PO CAPS: 500.0000 mg | ORAL_CAPSULE | Freq: Four times a day (QID) | ORAL | 0 refills | Status: DC

## 2018-08-20 MED ORDER — PREDNISONE 50 MG PO TABS
60.0000 mg | ORAL_TABLET | Freq: Once | ORAL | Status: AC
  Administered 2018-08-20: 60 mg via ORAL
  Filled 2018-08-20: qty 1

## 2018-08-20 MED ORDER — BENZONATATE 100 MG PO CAPS: 200.0000 mg | ORAL_CAPSULE | Freq: Three times a day (TID) | ORAL | 0 refills | Status: DC | PRN

## 2018-08-20 MED ORDER — BENZONATATE 100 MG PO CAPS
200.0000 mg | ORAL_CAPSULE | Freq: Once | ORAL | Status: AC
  Administered 2018-08-20: 200 mg via ORAL
  Filled 2018-08-20: qty 2

## 2018-08-20 MED ORDER — PREDNISONE 50 MG PO TABS: ORAL_TABLET | ORAL | 0 refills | Status: DC

## 2018-08-20 NOTE — ED Provider Notes (Signed)
[facility] EMERGENCY DEPARTMENT Provider Note   CSN: [id number] Arrival date & time: 08/20/18  2050     History   Chief Complaint Chief Complaint  Patient presents with  . Finger Pain    HPI Edwin Martinez is a 32 y.o. male with multiple complaints, the first being persistent pain and swelling of his right index finger since he sustained a puncture wound from a wire hangar 1 week ago.  He states the wound initially bled profusely, he cleaned it with alcohol and kept it bandaged but it has persistently been swollen, slightly reddened and yesterday the scab disrupted and drained a small amount of purulent discharge.    Secondly, describes a persistent cough, intermittent wheezing, nasal congestion and clear drainage since he was exposed to a dusty environment about 6 weeks ago during a work program which was part of a requirement of [place] at [place] [facility] [place].  He reports 2 full blown asthma attacks requiring ed visits during this time.  He has been on zyrtec which helps some, but does not currently have any of his other medicines including his albuterol.  He has recently moved back to this area and is pending re-establishment of care with [name] of infectious disease. He has been off all of his medicines including his HIV and his psych meds for weeks. His last CD4 count 04/06/18 was 1,560. He denies suicidal or homicidal ideation.  The history is provided by the patient.    Past Medical History:  Diagnosis Date  . ADD (attention deficit disorder)   . Anxiety   . Asthma   . Bipolar 1 disorder (HCC)   . Depression   . HIV (human immunodeficiency virus infection) (HCC) dx'd 2008  . Hypertension   . Insomnia   . Intentional drug overdose (HCC)    [name]/notes 02/25/2018  . Polysubstance abuse (HCC)    [name]/notes 02/25/2018  . Schizophrenia (HCC)   . Seizures (HCC)    "used to have little black-out szs where I'd drop out for 2-3 min then come back; nothing in the last  2-3-4years" (02/25/2018)  . Shingles     Patient Active Problem List   Diagnosis Date Noted  . Diarrhea due to protozoan   . Giardial enteritis   . Norovirus   . MDD (major depressive disorder) 02/26/2018  . Suicide attempt (HCC)   . Depression 11/08/2017  . Chest tightness   . Cough   . Leukocytosis   . SOB (shortness of breath)   . Nausea vomiting and diarrhea   . Asthma exacerbation 08/15/2017  . Elevated LFTs 08/12/2017  . Cocaine abuse with cocaine-induced mood disorder (HCC) 03/30/2017  . Herpes zoster 03/06/2017  . Polysubstance abuse (HCC) 03/06/2017  . Homelessness 03/06/2017  . Schizoaffective disorder (HCC) 12/23/2016  . Suicidal ideation   . Cannabis use disorder, moderate, dependence (HCC) 07/27/2016  . Tobacco user 07/27/2016  . Intentional drug overdose (HCC) 07/18/2016  . Asthma 05/20/2007  . HIV (human immunodeficiency virus infection) (HCC) 05/05/2007    Past Surgical History:  Procedure Laterality Date  . DENTAL SURGERY     "had my eye teeth pulled down"        Home Medications    Prior to Admission medications   Medication Sig Start Date End Date Taking? Authorizing Provider  albuterol (PROVENTIL HFA;VENTOLIN HFA) 108 (90 Base) MCG/ACT inhaler Inhale 2 puffs into the lungs every 6 (six) hours as needed for wheezing or shortness of breath. 03/04/18  Money, Gerlene Burdock, FNP  benzonatate (TESSALON) 100 MG capsule Take 2 capsules (200 mg total) by mouth 3 (three) times daily as needed. 08/20/18   Burgess Amor, PA-C  benztropine (COGENTIN) 0.5 MG tablet Take 1 tablet (0.5 mg total) by mouth at bedtime. 03/04/18   Money, Gerlene Burdock, FNP  bictegravir-emtricitabine-tenofovir AF (BIKTARVY) 50-200-25 MG TABS tablet Take 1 tablet by mouth daily. 07/22/18   Comer, Belia Heman, MD  busPIRone (BUSPAR) 10 MG tablet Take 10 mg by mouth 2 (two) times daily. 04/05/18   [provider]  cephALEXin (KEFLEX) 500 MG capsule Take 1 capsule (500 mg total) by mouth 4 (four)  times daily. 08/20/18   Burgess Amor, PA-C  citalopram (CELEXA) 20 MG tablet Take 20 mg by mouth daily. 01/18/18   [provider]  gabapentin (NEURONTIN) 300 MG capsule Take 1 capsule (300 mg total) by mouth 3 (three) times daily. For pain 03/04/18   Money, Gerlene Burdock, FNP  hydrOXYzine (ATARAX/VISTARIL) 25 MG tablet Take 1 tablet (25 mg total) by mouth 3 (three) times daily as needed for anxiety or nausea. 03/04/18   Money, Gerlene Burdock, FNP  predniSONE (DELTASONE) 50 MG tablet Take 1 tablet daily for 4 days. 08/20/18   Burgess Amor, PA-C  risperiDONE (RISPERDAL) 1 MG tablet Take 1 tablet (1 mg total) by mouth 2 (two) times daily. For mood control 03/04/18   Money, Gerlene Burdock, FNP  traZODone (DESYREL) 50 MG tablet Take 1 tablet (50 mg total) by mouth at bedtime as needed for sleep. 03/04/18   Money, Gerlene Burdock, FNP    Family History Family History  Problem Relation Age of Onset  . Huntington's disease Father   . Heart disease Mother   . Suicidality Maternal Uncle   . Suicidality Maternal Grandmother     Social History Social History   Tobacco Use  . Smoking status: Current Every Day Smoker    Packs/day: 0.50    Years: 20.00    Pack years: 10.00    Types: Cigarettes    Start date: 09/29/1991  . Smokeless tobacco: Never Used  Substance Use Topics  . Alcohol use: Yes    Alcohol/week: 4.0 standard drinks    Types: 4 Cans of beer per week    Comment: occasionally  . Drug use: Yes    Types: Marijuana, Cocaine    Comment: 02/25/2018 "nothing in years"     Allergies   Magnesium-containing compounds; Peanut-containing drug products; Esomeprazole magnesium; Atripla [efavirenz-emtricitab-tenofovir]; Atripla [efavirenz-emtricitab-tenofovir]; Bactrim [sulfamethoxazole-trimethoprim]; Magnesium-containing compounds; Nexium [esomeprazole magnesium]; Peanut-containing drug products; Penicillins; Bactrim [sulfamethoxazole-trimethoprim]; and Penicillins   Review of Systems Review of Systems    Constitutional: Negative for chills and fever.  HENT: Positive for congestion and rhinorrhea.   Respiratory: Positive for cough and wheezing. Negative for shortness of breath.   Musculoskeletal: Positive for arthralgias and joint swelling. Negative for myalgias.  Neurological: Negative for weakness and numbness.     Physical Exam Updated Vital Signs BP 133/71 (BP Location: Right Arm)   Pulse 84   Temp 98.2 F (36.8 C) (Oral)   Resp 18   SpO2 99%   Physical Exam  Constitutional: He is oriented to person, place, and time. He appears well-developed and well-nourished.  HENT:  Head: Normocephalic and atraumatic.  Right Ear: Tympanic membrane and ear canal normal.  Left Ear: Tympanic membrane and ear canal normal.  Nose: Mucosal edema and rhinorrhea present.  Mouth/Throat: Uvula is midline, oropharynx is clear and moist and mucous membranes are normal. No  oropharyngeal exudate, posterior oropharyngeal edema, posterior oropharyngeal erythema or tonsillar abscesses.  Eyes: Conjunctivae are normal.  Neck: Normal range of motion.  Cardiovascular: Normal rate and normal heart sounds.  Pulses equal bilaterally  Pulmonary/Chest: Effort normal. No respiratory distress. He has wheezes in the right middle field and the right lower field. He has no rales.  Abdominal: Soft. There is no tenderness.  Musculoskeletal: Normal range of motion. He exhibits tenderness.       Left hand: He exhibits tenderness and swelling. He exhibits normal capillary refill and no deformity. Normal sensation noted.  Small dry ulceration right long finger, lateral aspect of the DIP joint with mild surrounding erythema, no red streaking or drainage, no fluctuance. Ttp. Distal sensation intact with less than 2 sec cap refill. No drainage with gentle expression.  Neurological: He is alert and oriented to person, place, and time. He has normal strength. He displays normal reflexes. No sensory deficit.  Skin: Skin is warm and  dry. No rash noted.  Psychiatric: He has a normal mood and affect.     ED Treatments / Results  Labs (all labs ordered are listed, but only abnormal results are displayed) Labs Reviewed - No data to display  EKG None  Radiology Dg Chest 2 View  Result Date: 08/20/2018 CLINICAL DATA:  Cough and wheezing EXAM: CHEST - 2 VIEW COMPARISON:  04/26/2018 FINDINGS: The heart size and mediastinal contours are within normal limits. Both lungs are clear. The visualized skeletal structures are unremarkable. IMPRESSION: No active cardiopulmonary disease. Electronically Signed   By: Deatra Robinson M.D.   On: 08/20/2018 22:22   Dg Finger Index Right  Result Date: 08/20/2018 CLINICAL DATA:  Puncture wound to right index finger EXAM: RIGHT INDEX FINGER 2+V COMPARISON:  None. FINDINGS: There is no evidence of fracture or dislocation. There is no evidence of arthropathy or other focal bone abnormality. Soft tissues are unremarkable. IMPRESSION: No osseous abnormality or retained foreign body. Electronically Signed   By: Deatra Robinson M.D.   On: 08/20/2018 22:21    Procedures Procedures (including critical care time)  Medications Ordered in ED Medications  albuterol (PROVENTIL) (2.5 MG/3ML) 0.083% nebulizer solution 2.5 mg (2.5 mg Nebulization Given 08/20/18 2147)  ipratropium-albuterol (DUONEB) 0.5-2.5 (3) MG/3ML nebulizer solution 3 mL (3 mLs Nebulization Given 08/20/18 2147)  predniSONE (DELTASONE) tablet 60 mg (60 mg Oral Given 08/20/18 2136)  benzonatate (TESSALON) capsule 200 mg (200 mg Oral Given 08/20/18 2136)  albuterol (PROVENTIL HFA;VENTOLIN HFA) 108 (90 Base) MCG/ACT inhaler 1-2 puff (2 puffs Inhalation Given by Other 08/20/18 2203)     Initial Impression / Assessment and Plan / ED Course  I have reviewed the triage vital signs and the nursing notes.  Pertinent labs & imaging results that were available during my care of the patient were reviewed by me and considered in my medical  decision making (see chart for details).     Pt with chronic cough with wheezing and allergy like sx.  He was encouraged to continue using his zyrtec, he was given an albuterol mdi, tessalon perles, prednisone pulse dosing.  Reviewed and discussed xray - no joint involvement/ no fracture of finger.  There is edema, mild erythema. With pt hx of purulent drainage (none here) will cover with abx.  Plan f/u with Dr. [name] prn, return precautions discussed. No wheezing with normal pulm exam s/p neb tx.  Final Clinical Impressions(s) / ED Diagnoses   Final diagnoses:  Chronic cough  Mild intermittent asthma, unspecified whether  complicated  Finger infection    ED Discharge Orders         Ordered    benzonatate (TESSALON) 100 MG capsule  3 times daily PRN     08/20/18 2238    cephALEXin (KEFLEX) 500 MG capsule  4 times daily     08/20/18 2238    predniSONE (DELTASONE) 50 MG tablet     08/20/18 2238           Burgess Amor, PA-C 08/21/18 1252    Bethann Berkshire, MD 08/21/18 2307

## 2018-08-20 NOTE — ED Triage Notes (Signed)
Pt C/O right finger pain after getting it "caught on a close hanger 1 weeks ago." No drainage noted. Pt also C/O "white residue in my underwear when I wear dark underwear."

## 2018-08-20 NOTE — Discharge Instructions (Addendum)
Take your next dose of prednisone tomorrow evening.  Use the inhaler you were given this evening, 2 puffs every 4 hours if you are coughing or wheezing.  Tessalon is an excellent cough suppressant which may also help you with this chronic symptom.  I do recommend continuing your Zyrtec as well as I suspect your nasal symptoms may be at least partially triggering your persistent cough.  You have been prescribed Keflex for your finger infection.  Also was discussed doing warm water soak twice daily for your finger.  Get rechecked at any point that your symptoms worsen including worse pain, redness, swelling or return of drainage from this wound site or if you are coughing or wheezing worsen as well.

## 2018-08-20 NOTE — ED Notes (Signed)
Respiratory contacted for neb treatments.

## 2018-08-31 ENCOUNTER — Other Ambulatory Visit: Payer: Self-pay

## 2018-08-31 ENCOUNTER — Other Ambulatory Visit: Payer: Self-pay | Admitting: *Deleted

## 2018-08-31 ENCOUNTER — Ambulatory Visit: Payer: Self-pay

## 2018-08-31 DIAGNOSIS — Z113 Encounter for screening for infections with a predominantly sexual mode of transmission: Secondary | ICD-10-CM

## 2018-08-31 DIAGNOSIS — B20 Human immunodeficiency virus [HIV] disease: Secondary | ICD-10-CM

## 2018-08-31 DIAGNOSIS — Z79899 Other long term (current) drug therapy: Secondary | ICD-10-CM

## 2018-08-31 MED ORDER — BICTEGRAVIR-EMTRICITAB-TENOFOV 50-200-25 MG PO TABS: 1.0000 | ORAL_TABLET | Freq: Every day | ORAL | 0 refills | Status: DC

## 2018-09-01 LAB — T-HELPER CELL (CD4) - (RCID CLINIC ONLY)
CD4 % Helper T Cell: 32 % — ABNORMAL LOW (ref 33–55)
CD4 T Cell Abs: 1250 /uL (ref 400–2700)

## 2018-09-02 LAB — CBC WITH DIFFERENTIAL/PLATELET
Basophils Absolute: 76 {cells}/uL (ref 0–200)
Basophils Relative: 0.8 %
Eosinophils Absolute: 152 {cells}/uL (ref 15–500)
Eosinophils Relative: 1.6 %
HCT: 44.2 % (ref 38.5–50.0)
Hemoglobin: 14.9 g/dL (ref 13.2–17.1)
Lymphs Abs: 4009 {cells}/uL — ABNORMAL HIGH (ref 850–3900)
MCH: 28.4 pg (ref 27.0–33.0)
MCHC: 33.7 g/dL (ref 32.0–36.0)
MCV: 84.2 fL (ref 80.0–100.0)
MPV: 11 fL (ref 7.5–12.5)
Monocytes Relative: 6.8 %
Neutro Abs: 4617 {cells}/uL (ref 1500–7800)
Neutrophils Relative %: 48.6 %
Platelets: 318 10*3/uL (ref 140–400)
RBC: 5.25 Million/uL (ref 4.20–5.80)
RDW: 12.2 % (ref 11.0–15.0)
Total Lymphocyte: 42.2 %
WBC mixed population: 646 {cells}/uL (ref 200–950)
WBC: 9.5 10*3/uL (ref 3.8–10.8)

## 2018-09-02 LAB — LIPID PANEL
Cholesterol: 147 mg/dL
HDL: 45 mg/dL
LDL Cholesterol (Calc): 74 mg/dL
Non-HDL Cholesterol (Calc): 102 mg/dL
Total CHOL/HDL Ratio: 3.3 (calc)
Triglycerides: 189 mg/dL — ABNORMAL HIGH

## 2018-09-02 LAB — SYPHILIS: RPR W/REFLEX TO RPR TITER AND TREPONEMAL ANTIBODIES, TRADITIONAL SCREENING AND DIAGNOSIS ALGORITHM: RPR Ser Ql: NONREACTIVE

## 2018-09-02 LAB — HIV-1 RNA QUANT-NO REFLEX-BLD
HIV 1 RNA Quant: 17100 copies/mL — ABNORMAL HIGH
HIV-1 RNA Quant, Log: 4.23 Log copies/mL — ABNORMAL HIGH

## 2018-09-14 ENCOUNTER — Encounter: Payer: Self-pay | Admitting: Internal Medicine

## 2018-09-14 ENCOUNTER — Ambulatory Visit: Payer: Self-pay

## 2018-09-17 ENCOUNTER — Emergency Department (HOSPITAL_COMMUNITY): Payer: Self-pay

## 2018-09-17 ENCOUNTER — Emergency Department (HOSPITAL_COMMUNITY)
Admission: EM | Admit: 2018-09-17 | Discharge: 2018-09-17 | Disposition: A | Discharge status: Home / Self Care | Payer: Self-pay | Attending: Emergency Medicine | Admitting: Emergency Medicine

## 2018-09-17 ENCOUNTER — Encounter (HOSPITAL_COMMUNITY): Payer: Self-pay | Admitting: Emergency Medicine

## 2018-09-17 DIAGNOSIS — Z5900 Homelessness unspecified: Secondary | ICD-10-CM

## 2018-09-17 DIAGNOSIS — I10 Essential (primary) hypertension: Secondary | ICD-10-CM | POA: Insufficient documentation

## 2018-09-17 DIAGNOSIS — Z91010 Allergy to peanuts: Secondary | ICD-10-CM | POA: Insufficient documentation

## 2018-09-17 DIAGNOSIS — R053 Chronic cough: Secondary | ICD-10-CM

## 2018-09-17 DIAGNOSIS — F17210 Nicotine dependence, cigarettes, uncomplicated: Secondary | ICD-10-CM | POA: Insufficient documentation

## 2018-09-17 DIAGNOSIS — R05 Cough: Secondary | ICD-10-CM | POA: Insufficient documentation

## 2018-09-17 DIAGNOSIS — F17200 Nicotine dependence, unspecified, uncomplicated: Secondary | ICD-10-CM

## 2018-09-17 DIAGNOSIS — B20 Human immunodeficiency virus [HIV] disease: Secondary | ICD-10-CM | POA: Insufficient documentation

## 2018-09-17 DIAGNOSIS — Z79899 Other long term (current) drug therapy: Secondary | ICD-10-CM | POA: Insufficient documentation

## 2018-09-17 DIAGNOSIS — J45909 Unspecified asthma, uncomplicated: Secondary | ICD-10-CM | POA: Insufficient documentation

## 2018-09-17 MED ORDER — ALBUTEROL SULFATE HFA 108 (90 BASE) MCG/ACT IN AERS: 1.0000 | INHALATION_SPRAY | Freq: Four times a day (QID) | RESPIRATORY_TRACT | 0 refills | Status: DC | PRN

## 2018-09-17 MED ORDER — HYDROCODONE-HOMATROPINE 5-1.5 MG/5ML PO SYRP
5.0000 mL | ORAL_SOLUTION | Freq: Once | ORAL | Status: AC
  Administered 2018-09-17: 5 mL via ORAL
  Filled 2018-09-17: qty 5

## 2018-09-17 MED ORDER — IPRATROPIUM-ALBUTEROL 0.5-2.5 (3) MG/3ML IN SOLN
3.0000 mL | Freq: Once | RESPIRATORY_TRACT | Status: DC
  Filled 2018-09-17: qty 3

## 2018-09-17 MED ORDER — PREDNISONE 20 MG PO TABS
60.0000 mg | ORAL_TABLET | Freq: Once | ORAL | Status: AC
  Administered 2018-09-17: 60 mg via ORAL
  Filled 2018-09-17: qty 3

## 2018-09-17 MED ORDER — ALBUTEROL SULFATE HFA 108 (90 BASE) MCG/ACT IN AERS
2.0000 | INHALATION_SPRAY | Freq: Once | RESPIRATORY_TRACT | Status: AC
  Administered 2018-09-17: 2 via RESPIRATORY_TRACT
  Filled 2018-09-17: qty 6.7

## 2018-09-17 MED ORDER — NICOTINE 21 MG/24HR TD PT24
21.0000 mg | MEDICATED_PATCH | Freq: Once | TRANSDERMAL | Status: DC
  Administered 2018-09-17: 21 mg via TRANSDERMAL
  Filled 2018-09-17: qty 1

## 2018-09-17 MED ORDER — PREDNISONE 10 MG PO TABS: 40.0000 mg | ORAL_TABLET | Freq: Every day | ORAL | 0 refills | Status: AC

## 2018-09-17 MED ORDER — ALBUTEROL (5 MG/ML) CONTINUOUS INHALATION SOLN
10.0000 mg/h | INHALATION_SOLUTION | RESPIRATORY_TRACT | Status: DC
  Administered 2018-09-17: 10 mg/h via RESPIRATORY_TRACT
  Filled 2018-09-17: qty 20

## 2018-09-17 MED ORDER — IPRATROPIUM BROMIDE 0.02 % IN SOLN
1.0000 mg | Freq: Once | RESPIRATORY_TRACT | Status: AC
  Administered 2018-09-17: 1 mg via RESPIRATORY_TRACT
  Filled 2018-09-17: qty 5

## 2018-09-17 NOTE — Discharge Instructions (Addendum)
You were seen in the ER for cough.  Chest x-ray was normal.  I suspect the cough is multifactorial most likely from poorly treated allergies, asthma and tobacco use.  Take prednisone as prescribed.  Use albuterol inhaler as needed.  Use loratidine 20 mg once daily (over the counter). To help with nasal congestion and post nasal drip, use fluticasone (flonase) nasal spray.    You were given a nicotine patch here. Patch needs to be changed very 24 hours.  MATCH program does not apply to nicotine patches.  Nicotine patches cost start at $15 over the counter at pharmacies [facility]). You will need a patch once daily for up to 6-8 weeks.   Contact [facility] community [facility] to establish an appointment with a primary care doctor.  This clinic can help you manage your chronic cough long-term.  Ideally, and if you are able to afford a formal specialist appointment.  Your chronic cough and seasonal allergies should be evaluated by a pulmonologist.  Call [facility] [employer] pulmonary office to make an appointment.

## 2018-09-17 NOTE — ED Triage Notes (Signed)
Pt states he has had a cough for 4-5 months. Pt states he is occasionally able to cough up "green stuff." Pt denies any other cold symptoms.

## 2018-09-17 NOTE — ED Provider Notes (Addendum)
[facility] EMERGENCY DEPARTMENT Provider Note   CSN: [id number] Arrival date & time: 09/17/18  4580     History   Chief Complaint Chief Complaint  Patient presents with  . Cough    HPI Edwin Martinez is a 32 y.o. male with history of HIV CD4 count 1250 on 12/4, asthma, tobacco use, depression, seasonal allergies is here for evaluation of cough.  Persistent, onset for 5 months ago but more severe over the last 1 month.  His cough is paroxysmal, states he cannot have a full conversation without coughing.  His cough is mostly dry, sometimes he spits up some green sputum.  Associated with nasal congestion, runny nose and shortness of breath during coughing fits.  Also intermittently notices some wheezing.  He is taking zyrtec without relief. This is patient's 5th ER visit for cough in the last 2 months.  He was last seen 1 month ago and was discharged with prednisone, benzonatate, but could not pick it up because he could not afford it.  He is homeless. His godfather told him he needs to be checked out for COPD.  No associated fevers, chills, exertional chest pain or shortness of breath, hemoptysis.  Has been compliant with HIV medications.  Missed appointment with Dr. Graylon Good, ID, 3 days ago. HPI  Past Medical History:  Diagnosis Date  . ADD (attention deficit disorder)   . Anxiety   . Asthma   . Bipolar 1 disorder ([place])   . Depression   . HIV (human immunodeficiency virus infection) ([place]) dx'd 2008  . Hypertension   . Insomnia   . Intentional drug overdose ([place])    Archie Endo 02/25/2018  . Polysubstance abuse ([place])    Archie Endo 02/25/2018  . Schizophrenia ([place])   . Seizures ([place])    "used to have little black-out szs where I'd drop out for 2-3 min then come back; nothing in the last 2-3-4years" (02/25/2018)  . Shingles     Patient Active Problem List   Diagnosis Date Noted  . Diarrhea due to protozoan   . Giardial enteritis   . Norovirus   . MDD (major  depressive disorder) 02/26/2018  . Suicide attempt ([place])   . Depression 11/08/2017  . Chest tightness   . Cough   . Leukocytosis   . SOB (shortness of breath)   . Nausea vomiting and diarrhea   . Asthma exacerbation 08/15/2017  . Elevated LFTs 08/12/2017  . Cocaine abuse with cocaine-induced mood disorder ([place]) 03/30/2017  . Herpes zoster 03/06/2017  . Polysubstance abuse ([place]) 03/06/2017  . Homelessness 03/06/2017  . Schizoaffective disorder ([place]) 12/23/2016  . Suicidal ideation   . Cannabis use disorder, moderate, dependence ([place]) 07/27/2016  . Tobacco user 07/27/2016  . Intentional drug overdose ([place]) 07/18/2016  . Asthma 05/20/2007  . HIV (human immunodeficiency virus infection) ([place]) 05/05/2007    Past Surgical History:  Procedure Laterality Date  . DENTAL SURGERY     "had my eye teeth pulled down"        Home Medications    Prior to Admission medications   Medication Sig Start Date End Date Taking? Authorizing Provider  benztropine (COGENTIN) 0.5 MG tablet Take 1 tablet (0.5 mg total) by mouth at bedtime. 03/04/18  Yes Money, Lowry Ram, FNP  bictegravir-emtricitabine-tenofovir AF (BIKTARVY) 50-200-25 MG TABS tablet Take 1 tablet by mouth daily. 08/31/18  Yes Carlyle Basques, MD  busPIRone (BUSPAR) 10 MG tablet Take 10 mg by mouth 2 (two) times daily. 04/05/18  Yes  [provider]  citalopram (CELEXA) 20 MG tablet Take 20 mg by mouth daily. 01/18/18  Yes [provider]  fluticasone (FLONASE) 50 MCG/ACT nasal spray Place 2 sprays into the nose daily. 08/05/18 08/05/19 Yes [provider]  gabapentin (NEURONTIN) 300 MG capsule Take 1 capsule (300 mg total) by mouth 3 (three) times daily. For pain 03/04/18  Yes Money, Lowry Ram, FNP  hydrOXYzine (ATARAX/VISTARIL) 25 MG tablet Take 1 tablet (25 mg total) by mouth 3 (three) times daily as needed for anxiety or nausea. 03/04/18  Yes Money, Lowry Ram, FNP  risperiDONE (RISPERDAL) 1 MG tablet Take 1 tablet (1 mg  total) by mouth 2 (two) times daily. For mood control 03/04/18  Yes Money, Lowry Ram, FNP  traZODone (DESYREL) 50 MG tablet Take 1 tablet (50 mg total) by mouth at bedtime as needed for sleep. 03/04/18  Yes Money, Lowry Ram, FNP  albuterol (PROVENTIL HFA;VENTOLIN HFA) 108 (90 Base) MCG/ACT inhaler Inhale 1-2 puffs into the lungs every 6 (six) hours as needed for wheezing or shortness of breath. 09/17/18   Kinnie Feil, PA-C  benzonatate (TESSALON) 100 MG capsule Take 2 capsules (200 mg total) by mouth 3 (three) times daily as needed. Patient not taking: Reported on 09/17/2018 08/20/18   Evalee Jefferson, PA-C  cephALEXin (KEFLEX) 500 MG capsule Take 1 capsule (500 mg total) by mouth 4 (four) times daily. Patient not taking: Reported on 09/17/2018 08/20/18   Evalee Jefferson, PA-C  predniSONE (DELTASONE) 10 MG tablet Take 4 tablets (40 mg total) by mouth daily for 5 days. 09/17/18 09/22/18  Kinnie Feil, PA-C    Family History Family History  Problem Relation Age of Onset  . Huntington's disease Father   . Heart disease Mother   . Suicidality Maternal Uncle   . Suicidality Maternal Grandmother     Social History Social History   Tobacco Use  . Smoking status: Current Every Day Smoker    Packs/day: 0.50    Years: 20.00    Pack years: 10.00    Types: Cigarettes    Start date: 09/29/1991  . Smokeless tobacco: Never Used  Substance Use Topics  . Alcohol use: Yes    Alcohol/week: 4.0 standard drinks    Types: 4 Cans of beer per week    Comment: occasionally  . Drug use: Yes    Types: Marijuana, Cocaine    Comment: 02/25/2018 "nothing in years"     Allergies   Magnesium-containing compounds; Peanut-containing drug products; Esomeprazole magnesium; Atripla [efavirenz-emtricitab-tenofovir]; Atripla [efavirenz-emtricitab-tenofovir]; Bactrim [sulfamethoxazole-trimethoprim]; Magnesium-containing compounds; Nexium [esomeprazole magnesium]; Peanut-containing drug products; Penicillins; Bactrim  [sulfamethoxazole-trimethoprim]; and Penicillins   Review of Systems Review of Systems  HENT: Positive for congestion, postnasal drip and rhinorrhea.   Respiratory: Positive for cough and wheezing.   All other systems reviewed and are negative.    Physical Exam Updated Vital Signs BP 118/71   Pulse 75   Temp 97.9 F (36.6 C) (Oral)   Resp 18   SpO2 100%   Physical Exam Vitals signs and nursing note reviewed.  Constitutional:      General: He is not in acute distress.    Appearance: He is well-developed.     Comments: NAD.  HENT:     Head: Normocephalic and atraumatic.     Right Ear: External ear normal.     Left Ear: External ear normal.     Nose: Nose normal.     Comments: Sounds congested. Moderate mucosal edema and erythema bilaterally. Clear  rhinorrhea noted. Oropharynx and tonsils normal.  Eyes:     General: No scleral icterus.    Conjunctiva/sclera: Conjunctivae normal.  Neck:     Musculoskeletal: Normal range of motion and neck supple.  Cardiovascular:     Rate and Rhythm: Normal rate and regular rhythm.     Heart sounds: Normal heart sounds.     Comments: No lower extremity edema or calf tenderness. Pulmonary:     Effort: Pulmonary effort is normal.     Breath sounds: Wheezing present.     Comments: Frequent dry coughing throughout exam interrupting conversation.  Normal WOB. Scant wheezing to upper lobes bilaterally. No crackles.  Musculoskeletal: Normal range of motion.        General: No deformity.  Skin:    General: Skin is warm and dry.     Capillary Refill: Capillary refill takes less than 2 seconds.  Neurological:     Mental Status: He is alert and oriented to person, place, and time.  Psychiatric:        Behavior: Behavior normal.        Thought Content: Thought content normal.        Judgment: Judgment normal.      ED Treatments / Results  Labs (all labs ordered are listed, but only abnormal results are displayed) Labs Reviewed - No data  to display  EKG None  Radiology Dg Chest Portable 1 View  Result Date: 09/17/2018 CLINICAL DATA:  Chronic cough for 5 months.  HIV. EXAM: PORTABLE CHEST 1 VIEW COMPARISON:  08/20/2018 FINDINGS: The heart size and mediastinal contours are within normal limits. Both lungs are clear. The visualized skeletal structures are unremarkable. IMPRESSION: No active disease. Electronically Signed   By: Earle Gell M.D.   On: 09/17/2018 08:33    Procedures Procedures (including critical care time)  Medications Ordered in ED Medications  albuterol (PROVENTIL,VENTOLIN) solution continuous neb (0 mg/hr Nebulization Stopped 09/17/18 0923)  nicotine (NICODERM CQ - dosed in mg/24 hours) patch 21 mg (21 mg Transdermal Patch Applied 09/17/18 0939)  predniSONE (DELTASONE) tablet 60 mg (60 mg Oral Given 09/17/18 0729)  ipratropium (ATROVENT) nebulizer solution 1 mg (1 mg Nebulization Given 09/17/18 0736)  HYDROcodone-homatropine (HYCODAN) 5-1.5 MG/5ML syrup 5 mL (5 mLs Oral Given 09/17/18 0753)  albuterol (PROVENTIL HFA;VENTOLIN HFA) 108 (90 Base) MCG/ACT inhaler 2 puff (2 puffs Inhalation Given 09/17/18 0939)     Initial Impression / Assessment and Plan / ED Course  I have reviewed the triage vital signs and the nursing notes.  Pertinent labs & imaging results that were available during my care of the patient were reviewed by me and considered in my medical decision making (see chart for details).  Clinical Course as of Sep 18 947  Sat Sep 17, 2018  2831 Reevaluated patient.  He is halfway done with his breathing treatment.  States his cough has been less frequent since the medicines.  Ambulatory to the bathroom without shortness of breath.   [CG]  [phone] Spoke to case management IV who has met with patient.  Prednisone and albuterol MDI will be $3 each for patient.  Patient states he will be able to afford this.  I offered a nebulizing machine however patient told case management he is homeless, I do  not think he will be compliant with this so we will discharge with MDI instead.   [CG]    Clinical Course User Index [CG] Kinnie Feil, PA-C   32 year old is here with chronic cough  for 5 months.  I suspect this is multifactorial from seasonal allergies, asthma and ongoing tobacco use.  This is his fifth ER visit for same.  History of HIV, well controlled.  I considered PCP less likely given chronicity of symptoms, no constitutional symptoms.  He has no exertional chest pain or shortness of breath.  He does not look fluid overloaded and I doubt heart failure related cough given symptoms and exam.  There is no tachypnea or hypoxia.  We will give prednisone, continuous breathing treatment and obtain chest x-ray.  Plan to reassess.  He was unable to afford his medicines at last ER visit, we will consult case management for assistance.  0930: Patient feels better after medicines.  His cough is less frequent.  CM met with patient. MATCH program will help afford prednisone and albuterol inhaler.  He is asking for nicotine patch, unfortunately he will have to pay for nicotine patch on his own.  Strongly encourage follow-up with ID for routine HIV management.  Gave contact information for [facility] to establish care and further management of chronic cough.  Return precautions were discussed.  Final Clinical Impressions(s) / ED Diagnoses   Final diagnoses:  Chronic cough  Homelessness  Tobacco dependence    ED Discharge Orders         Ordered    predniSONE (DELTASONE) 10 MG tablet  Daily     09/17/18 0916    albuterol (PROVENTIL HFA;VENTOLIN HFA) 108 (90 Base) MCG/ACT inhaler  Every 6 hours PRN     09/17/18 0916           Kinnie Feil, PA-C 09/17/18 [place], Peterson, DO 09/17/18 1703

## 2018-09-17 NOTE — Care Management (Signed)
CM acknowledges consult for medication assistance. Provided patient with MATCH letter, pt verbalized understanding how to use it. Patient states that he is active w Dr Chaya JanSnyder's office. Encouraged to schedule a follow up appointment Monday. He states he gets his HIV meds free. No other CM needs identified. Edwin Sabalebbie Katey Barrie RN BSN CPN Case Management [phone] Please [facility] to [facility] for CM provider on call

## 2018-09-25 ENCOUNTER — Other Ambulatory Visit: Payer: Self-pay | Admitting: Internal Medicine

## 2018-09-25 DIAGNOSIS — B20 Human immunodeficiency virus [HIV] disease: Secondary | ICD-10-CM

## 2018-10-05 ENCOUNTER — Encounter (HOSPITAL_COMMUNITY): Payer: Self-pay | Admitting: Emergency Medicine

## 2018-10-05 ENCOUNTER — Emergency Department (HOSPITAL_COMMUNITY)
Admission: EM | Admit: 2018-10-05 | Discharge: 2018-10-06 | Disposition: A | Discharge status: Other Acute Inpatient Hospital | Payer: Self-pay | Attending: Emergency Medicine | Admitting: Emergency Medicine

## 2018-10-05 DIAGNOSIS — F909 Attention-deficit hyperactivity disorder, unspecified type: Secondary | ICD-10-CM | POA: Insufficient documentation

## 2018-10-05 DIAGNOSIS — T1491XA Suicide attempt, initial encounter: Secondary | ICD-10-CM

## 2018-10-05 DIAGNOSIS — Z79899 Other long term (current) drug therapy: Secondary | ICD-10-CM | POA: Insufficient documentation

## 2018-10-05 DIAGNOSIS — F17210 Nicotine dependence, cigarettes, uncomplicated: Secondary | ICD-10-CM | POA: Insufficient documentation

## 2018-10-05 DIAGNOSIS — Z91010 Allergy to peanuts: Secondary | ICD-10-CM | POA: Insufficient documentation

## 2018-10-05 DIAGNOSIS — T50904A Poisoning by unspecified drugs, medicaments and biological substances, undetermined, initial encounter: Secondary | ICD-10-CM

## 2018-10-05 DIAGNOSIS — F1420 Cocaine dependence, uncomplicated: Secondary | ICD-10-CM | POA: Insufficient documentation

## 2018-10-05 DIAGNOSIS — R45851 Suicidal ideations: Secondary | ICD-10-CM | POA: Insufficient documentation

## 2018-10-05 DIAGNOSIS — F332 Major depressive disorder, recurrent severe without psychotic features: Secondary | ICD-10-CM | POA: Insufficient documentation

## 2018-10-05 LAB — COMPREHENSIVE METABOLIC PANEL
ALT: 111 U/L — ABNORMAL HIGH (ref 0–44)
AST: 90 U/L — ABNORMAL HIGH (ref 15–41)
Albumin: 3.9 g/dL (ref 3.5–5.0)
Alkaline Phosphatase: 65 U/L (ref 38–126)
Anion gap: 9 (ref 5–15)
BUN: 10 mg/dL (ref 6–20)
CHLORIDE: 105 mmol/L (ref 98–111)
CO2: 25 mmol/L (ref 22–32)
Calcium: 9.3 mg/dL (ref 8.9–10.3)
Creatinine, Ser: 0.96 mg/dL (ref 0.61–1.24)
GFR calc Af Amer: >60 mL/min (ref >60)
GFR calc non Af Amer: >60 mL/min (ref >60)
Glucose, Bld: 82 mg/dL (ref 70–99)
Potassium: 3.5 mmol/L (ref 3.5–5.1)
Sodium: 139 mmol/L (ref 135–145)
Total Bilirubin: 0.9 mg/dL (ref 0.3–1.2)
Total Protein: 7.1 g/dL (ref 6.5–8.1)

## 2018-10-05 LAB — BASIC METABOLIC PANEL
Anion gap: 8 (ref 5–15)
BUN: 9 mg/dL (ref 6–20)
CO2: 20 mmol/L — AB (ref 22–32)
Calcium: 9.2 mg/dL (ref 8.9–10.3)
Chloride: 112 mmol/L — ABNORMAL HIGH (ref 98–111)
Creatinine, Ser: 0.89 mg/dL (ref 0.61–1.24)
GFR calc Af Amer: >60 mL/min (ref >60)
GFR calc non Af Amer: >60 mL/min (ref >60)
GLUCOSE: 87 mg/dL (ref 70–99)
Potassium: 3.5 mmol/L (ref 3.5–5.1)
Sodium: 140 mmol/L (ref 135–145)

## 2018-10-05 LAB — CBC
HCT: 45.8 % (ref 39.0–52.0)
Hemoglobin: 14.5 g/dL (ref 13.0–17.0)
MCH: 27.7 pg (ref 26.0–34.0)
MCHC: 31.7 g/dL (ref 30.0–36.0)
MCV: 87.6 fL (ref 80.0–100.0)
Platelets: 271 10*3/uL (ref 150–400)
RBC: 5.23 MIL/uL (ref 4.22–5.81)
RDW: 14.4 % (ref 11.5–15.5)
WBC: 9.2 10*3/uL (ref 4.0–10.5)
nRBC: 0 % (ref 0.0–0.2)

## 2018-10-05 LAB — SALICYLATE LEVEL
Salicylate Lvl: <7 mg/dL (ref 2.8–30.0)
Salicylate Lvl: 11.5 mg/dL (ref 2.8–30.0)
Salicylate Lvl: 8.7 mg/dL (ref 2.8–30.0)
Salicylate Lvl: <7 mg/dL (ref 2.8–30.0)

## 2018-10-05 LAB — RAPID URINE DRUG SCREEN, HOSP PERFORMED
Amphetamines: NOT DETECTED
Barbiturates: NOT DETECTED
Benzodiazepines: NOT DETECTED
Cocaine: POSITIVE — AB
Opiates: NOT DETECTED
Tetrahydrocannabinol: NOT DETECTED

## 2018-10-05 LAB — ACETAMINOPHEN LEVEL
Acetaminophen (Tylenol), Serum: 13 ug/mL (ref 10–30)
Acetaminophen (Tylenol), Serum: 19 ug/mL (ref 10–30)

## 2018-10-05 LAB — CBG MONITORING, ED: GLUCOSE-CAPILLARY: 72 mg/dL (ref 70–99)

## 2018-10-05 LAB — ETHANOL: Alcohol, Ethyl (B): <10 mg/dL (ref <10)

## 2018-10-05 MED ORDER — ACTIDOSE WITH SORBITOL 50 GM/240ML PO LIQD
50.0000 g | Freq: Once | ORAL | Status: AC
  Administered 2018-10-05: 50 g via ORAL
  Filled 2018-10-05: qty 240

## 2018-10-05 MED ORDER — ONDANSETRON HCL 4 MG PO TABS: 4.0000 mg | ORAL_TABLET | Freq: Three times a day (TID) | ORAL | Status: DC | PRN

## 2018-10-05 MED ORDER — ACETAMINOPHEN 325 MG PO TABS: 650.0000 mg | ORAL_TABLET | ORAL | Status: DC | PRN

## 2018-10-05 NOTE — ED Notes (Signed)
Poison control notified of salicylate level of 7 going to 11.5. Poison control stated a repeat salicylate is needed. EDP notified.

## 2018-10-05 NOTE — ED Provider Notes (Signed)
Received patient at signout from [place].  Refer to provider note for full history and physical examination.  Briefly, patient is a 33 year old male with history of bipolar 1 disorder, ADD, anxiety, HIV, depression, polysubstance abuse, schizophrenia presenting for evaluation after intentional ingestion.  He reports that he took 20 Excedrin as an attempt to kill himself but called EMS within 30 minutes of taking it because he had changed his mind.  Lab work thus far has been reassuring with Tylenol levels therapeutic but not elevated.  Poison control requesting a third salicylate level.  If this is normal, patient is medically cleared for TTS evaluation at this time.  MDM  Third salicylate level is therapeutic and lower than the last.  He is medically cleared for TTS evaluation at this time.  Final disposition pending psychiatry.  Of note, he is here voluntarily and may require IVC if he attempts to leave prior to their assessment.      Jeanie Sewer, PA-C 10/05/18 1651    Linwood Dibbles, MD 10/06/18 1534
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
## 2018-10-05 NOTE — ED Notes (Signed)
Pt told this RN after blood was collected that he refuses to have anymore blood drawn until he is fed. RN confirmed with EDP that pt can eat and meal was given.  Sitter at bedside. Will continue to monitor.

## 2018-10-05 NOTE — Progress Notes (Signed)
Pt meets inpatient criteria per Hillery Jacks, NP. Referral information has been sent to the following hospitals for review: [facility]  [facility]  [facility]  [facility]  [facility]  [facility]  [facility]   Disposition will continue to assist with inpatient placement needs.   Wells Guiles, LCSW, LCAS Disposition CSW [facility] [phone]

## 2018-10-05 NOTE — ED Notes (Signed)
Belongings inventoried and placed in locker 8.  

## 2018-10-05 NOTE — ED Notes (Signed)
Dinner tray ordered.

## 2018-10-05 NOTE — ED Notes (Addendum)
Spk w/ Wilkie Aye, RN at [employer], stated that pt wld need EKG and monitoring; 4hr Acetaminophen level, ASA level, and if elevated then repeat q2hrs  Until it peaks, electrolyte level to ensure not acidotic, may give a dose of Charcoal within 1 hr of ingestion (1g/kg).

## 2018-10-05 NOTE — ED Notes (Signed)
Lunch tray at bedside. Pt eating at this time.

## 2018-10-05 NOTE — ED Provider Notes (Signed)
Medical screening examination/treatment/procedure(s) were conducted as a shared visit with non-physician practitioner(s) and myself.  I personally evaluated the patient during the encounter.  EKG Interpretation  Date/Time:  Wednesday October 05 2018 06:51:52 EST Ventricular Rate:  53 PR Interval:    QRS Duration: 85 QT Interval:  418 QTC Calculation: 393 R Axis:   84 Text Interpretation:  Sinus rhythm ST elevation suggests acute pericarditis similar to previous, t waves slightly more acute than first previous but similar to oldest Confirmed by Arby Barrette [phone]) on 10/05/2018 7:08:53 AM  Patient presents having overdosed on Excedrin.  Reportedly he took 20 tablets prior to arrival.  Patient had intention to kill himself.  Patient denies that he is having any pain at this time.  He reports some mild nausea.  He denies he is sick leading up to this.  He however is homeless and has multiple medical problems including HIV.  History of drug use.  Patient is resting quietly.  No distress.  He awakens to voice.  Mental status is clear.  Heart regular.  Lungs grossly clear.  Abdomen soft without guarding.  Skin warm and dry.  I agree with plan and management.   Arby Barrette, MD 10/05/18 1023

## 2018-10-05 NOTE — ED Triage Notes (Addendum)
Pt reports he is off his psychiatric medications and today the voices told him to take the whole bottle of excedrin (reports 20 pills) which he did 30 minutes ago.  He states he was trying to kill himself.  Pt has been changed into scrubs, checked by security and belongings were gathered and taken away.  Staffing called for sitter.

## 2018-10-05 NOTE — ED Provider Notes (Signed)
[facility] EMERGENCY DEPARTMENT Provider Note   CSN: [id number] Arrival date & time: 10/05/18  5284     History   Chief Complaint Chief Complaint  Patient presents with  . Suicidal  . Ingestion    HPI Edwin Martinez is a 33 y.o. male.  The history is provided by the patient. No language interpreter was used.  Ingestion  This is a new problem. The current episode started less than 1 hour ago. The problem occurs constantly. Nothing aggravates the symptoms. Nothing relieves the symptoms. He has tried nothing for the symptoms. The treatment provided no relief.   Pt reports he took 20 excedrin tablets.  Pt reports he wanted to kill himself.  Pt reports drug use and homeless.  Pt states he has no family.  Pt reports he lives in an abandoned building.  Pt reports he has almost no contact with anyone.  No family.  Pt is suppose to see [name] and Dr. [name] in ID clinic but he has not been seeing and he is out of all of his medications  Past Medical History:  Diagnosis Date  . ADD (attention deficit disorder)   . Anxiety   . Asthma   . Bipolar 1 disorder (HCC)   . Depression   . HIV (human immunodeficiency virus infection) (HCC) dx'd 2008  . Hypertension   . Insomnia   . Intentional drug overdose (HCC)    [name] 02/25/2018  . Polysubstance abuse (HCC)    [name] 02/25/2018  . Schizophrenia (HCC)   . Seizures (HCC)    "used to have little black-out szs where I'd drop out for 2-3 min then come back; nothing in the last 2-3-4years" (02/25/2018)  . Shingles     Patient Active Problem List   Diagnosis Date Noted  . Diarrhea due to protozoan   . Giardial enteritis   . Norovirus   . MDD (major depressive disorder) 02/26/2018  . Suicide attempt (HCC)   . Depression 11/08/2017  . Chest tightness   . Cough   . Leukocytosis   . SOB (shortness of breath)   . Nausea vomiting and diarrhea   . Asthma exacerbation 08/15/2017  . Elevated LFTs 08/12/2017  .  Cocaine abuse with cocaine-induced mood disorder (HCC) 03/30/2017  . Herpes zoster 03/06/2017  . Polysubstance abuse (HCC) 03/06/2017  . Homelessness 03/06/2017  . Schizoaffective disorder (HCC) 12/23/2016  . Suicidal ideation   . Cannabis use disorder, moderate, dependence (HCC) 07/27/2016  . Tobacco user 07/27/2016  . Intentional drug overdose (HCC) 07/18/2016  . Asthma 05/20/2007  . HIV (human immunodeficiency virus infection) (HCC) 05/05/2007    Past Surgical History:  Procedure Laterality Date  . DENTAL SURGERY     "had my eye teeth pulled down"        Home Medications    Prior to Admission medications   Medication Sig Start Date End Date Taking? Authorizing Provider  albuterol (PROVENTIL HFA;VENTOLIN HFA) 108 (90 Base) MCG/ACT inhaler Inhale 1-2 puffs into the lungs every 6 (six) hours as needed for wheezing or shortness of breath. Patient not taking: Reported on 10/05/2018 09/17/18   Liberty Handy, PA-C  benzonatate (TESSALON) 100 MG capsule Take 2 capsules (200 mg total) by mouth 3 (three) times daily as needed. Patient not taking: Reported on 09/17/2018 08/20/18   Burgess Amor, PA-C  benztropine (COGENTIN) 0.5 MG tablet Take 1 tablet (0.5 mg total) by mouth at bedtime. Patient not taking: Reported on 10/05/2018 03/04/18   Money,  Gerlene Burdockravis B, FNP  bictegravir-emtricitabine-tenofovir AF (BIKTARVY) 50-200-25 MG TABS tablet Take 1 tablet by mouth daily. Patient not taking: Reported on 10/05/2018 08/31/18   Judyann MunsonSnider, Cynthia, MD  cephALEXin (KEFLEX) 500 MG capsule Take 1 capsule (500 mg total) by mouth 4 (four) times daily. Patient not taking: Reported on 09/17/2018 08/20/18   Burgess AmorIdol, Julie, PA-C  gabapentin (NEURONTIN) 300 MG capsule Take 1 capsule (300 mg total) by mouth 3 (three) times daily. For pain Patient not taking: Reported on 10/05/2018 03/04/18   Money, Gerlene Burdockravis B, FNP  hydrOXYzine (ATARAX/VISTARIL) 25 MG tablet Take 1 tablet (25 mg total) by mouth 3 (three) times daily as needed  for anxiety or nausea. Patient not taking: Reported on 10/05/2018 03/04/18   Money, Gerlene Burdockravis B, FNP  risperiDONE (RISPERDAL) 1 MG tablet Take 1 tablet (1 mg total) by mouth 2 (two) times daily. For mood control Patient not taking: Reported on 10/05/2018 03/04/18   Money, Gerlene Burdockravis B, FNP  traZODone (DESYREL) 50 MG tablet Take 1 tablet (50 mg total) by mouth at bedtime as needed for sleep. Patient not taking: Reported on 10/05/2018 03/04/18   Money, Gerlene Burdockravis B, FNP    Family History Family History  Problem Relation Age of Onset  . Huntington's disease Father   . Heart disease Mother   . Suicidality Maternal Uncle   . Suicidality Maternal Grandmother     Social History Social History   Tobacco Use  . Smoking status: Current Every Day Smoker    Packs/day: 0.50    Years: 20.00    Pack years: 10.00    Types: Cigarettes    Start date: 09/29/1991  . Smokeless tobacco: Never Used  Substance Use Topics  . Alcohol use: Yes    Alcohol/week: 4.0 standard drinks    Types: 4 Cans of beer per week    Comment: occasionally  . Drug use: Yes    Types: Marijuana, Cocaine    Comment: 02/25/2018 "nothing in years"     Allergies   Magnesium-containing compounds; Peanut-containing drug products; Esomeprazole magnesium; Atripla [efavirenz-emtricitab-tenofovir]; Atripla [efavirenz-emtricitab-tenofovir]; Bactrim [sulfamethoxazole-trimethoprim]; Magnesium-containing compounds; Nexium [esomeprazole magnesium]; Peanut-containing drug products; Penicillins; Bactrim [sulfamethoxazole-trimethoprim]; and Penicillins   Review of Systems Review of Systems  All other systems reviewed and are negative.    Physical Exam Updated Vital Signs BP 113/65   Pulse (!) 56   Temp 97.6 F (36.4 C) (Oral)   Resp 16   SpO2 95%   Physical Exam Vitals signs and nursing note reviewed.  Constitutional:      Appearance: He is well-developed.  HENT:     Head: Normocephalic and atraumatic.     Right Ear: External ear normal.      Left Ear: External ear normal.     Nose: Nose normal.     Mouth/Throat:     Mouth: Mucous membranes are moist.  Eyes:     Conjunctiva/sclera: Conjunctivae normal.  Neck:     Musculoskeletal: Neck supple.  Cardiovascular:     Rate and Rhythm: Normal rate and regular rhythm.     Heart sounds: No murmur.  Pulmonary:     Effort: Pulmonary effort is normal. No respiratory distress.     Breath sounds: Normal breath sounds.  Abdominal:     Palpations: Abdomen is soft.     Tenderness: There is no abdominal tenderness.  Musculoskeletal: Normal range of motion.  Skin:    General: Skin is warm and dry.  Neurological:     General: No focal deficit present.  Mental Status: He is alert.  Psychiatric:        Mood and Affect: Mood normal.      ED Treatments / Results  Labs (all labs ordered are listed, but only abnormal results are displayed) Labs Reviewed  COMPREHENSIVE METABOLIC PANEL - Abnormal; Notable for the following components:      Result Value   AST 90 (*)    ALT 111 (*)    All other components within normal limits  RAPID URINE DRUG SCREEN, HOSP PERFORMED - Abnormal; Notable for the following components:   Cocaine POSITIVE (*)    All other components within normal limits  ETHANOL  SALICYLATE LEVEL  ACETAMINOPHEN LEVEL  CBC  CBG MONITORING, ED    EKG EKG Interpretation  Date/Time:  Wednesday October 05 2018 06:51:52 EST Ventricular Rate:  53 PR Interval:    QRS Duration: 85 QT Interval:  418 QTC Calculation: 393 R Axis:   84 Text Interpretation:  Sinus rhythm ST elevation suggests acute pericarditis similar to previous, t waves slightly more acute than first previous but similar to oldest Confirmed by Arby BarrettePfeiffer, Marcy [phone]) on 10/05/2018 7:08:53 AM   Radiology No results found.  Procedures Procedures (including critical care time)  Medications Ordered in ED Medications  activated charcoal-sorbitol (ACTIDOSE-SORBITOL) suspension 50 g (50 g Oral Given  10/05/18 [id number])     Initial Impression / Assessment and Plan / ED Course  I have reviewed the triage vital signs and the nursing notes.  Pertinent labs & imaging results that were available during my care of the patient were reviewed by me and considered in my medical decision making (see chart for details).  Clinical Course as of Oct 05 1001  Wed Oct 05, 2018  0957 Acetaminophen (Tylenol), S: 13 [GS]    Clinical Course User Index [GS] Laurelyn SickleStevenson, George H, Student-PA  Excedrin acetaminophen 250. Aspirin 250 and caffeine 60.    Acetaminophen 5000, ASA 5000, Caffeine 1200  Posion control advised 4 hour tylenol level.   MDM  Pt given charcoal.   Labs returned and are reviewed.   Poison control cleared pt.   I will obtain TTS consult.  I do not think pt took the amount of medication he claims.   Final Clinical Impressions(s) / ED Diagnoses   Final diagnoses:  Suicidal ideation  Drug overdose, undetermined intent, initial encounter  Suicide attempt [facility](HCC)    ED Discharge Orders    None       Elson AreasSofia, Kathia Covington K, [place] JerseyPA-C 10/05/18 1353    Arby BarrettePfeiffer, Marcy, MD 10/05/18 1649

## 2018-10-05 NOTE — ED Notes (Signed)
Lab confirmed they have salicylate level and are running.

## 2018-10-05 NOTE — ED Notes (Signed)
Pt calling out stating he hasn't eaten since he has been here. RN reminded pt he received a [employer] sandwich meal at 1216 and that lunch has been ordered. Will continue to monitor.

## 2018-10-05 NOTE — ED Notes (Signed)
Valuables with security and medications with pharmacy.

## 2018-10-05 NOTE — ED Notes (Signed)
Lunch ordered for patient.

## 2018-10-06 ENCOUNTER — Encounter (HOSPITAL_COMMUNITY): Payer: Self-pay

## 2018-10-06 ENCOUNTER — Other Ambulatory Visit: Payer: Self-pay

## 2018-10-06 ENCOUNTER — Inpatient Hospital Stay (HOSPITAL_COMMUNITY)
Admission: AD | Admit: 2018-10-06 | Discharge: 2018-10-12 | DRG: 885 | Disposition: A | Discharge status: Home / Self Care | Payer: Federal, State, Local not specified - Other | Source: Intra-hospital | Attending: Psychiatry | Admitting: Psychiatry

## 2018-10-06 DIAGNOSIS — Z8249 Family history of ischemic heart disease and other diseases of the circulatory system: Secondary | ICD-10-CM | POA: Diagnosis not present

## 2018-10-06 DIAGNOSIS — Z881 Allergy status to other antibiotic agents status: Secondary | ICD-10-CM | POA: Diagnosis not present

## 2018-10-06 DIAGNOSIS — F1414 Cocaine abuse with cocaine-induced mood disorder: Secondary | ICD-10-CM | POA: Diagnosis present

## 2018-10-06 DIAGNOSIS — R45851 Suicidal ideations: Secondary | ICD-10-CM | POA: Diagnosis present

## 2018-10-06 DIAGNOSIS — Z888 Allergy status to other drugs, medicaments and biological substances status: Secondary | ICD-10-CM | POA: Diagnosis not present

## 2018-10-06 DIAGNOSIS — Z915 Personal history of self-harm: Secondary | ICD-10-CM | POA: Diagnosis not present

## 2018-10-06 DIAGNOSIS — Z23 Encounter for immunization: Secondary | ICD-10-CM | POA: Diagnosis not present

## 2018-10-06 DIAGNOSIS — Z818 Family history of other mental and behavioral disorders: Secondary | ICD-10-CM

## 2018-10-06 DIAGNOSIS — F259 Schizoaffective disorder, unspecified: Secondary | ICD-10-CM | POA: Diagnosis present

## 2018-10-06 DIAGNOSIS — Z91010 Allergy to peanuts: Secondary | ICD-10-CM | POA: Diagnosis not present

## 2018-10-06 DIAGNOSIS — F988 Other specified behavioral and emotional disorders with onset usually occurring in childhood and adolescence: Secondary | ICD-10-CM | POA: Diagnosis present

## 2018-10-06 DIAGNOSIS — Z87892 Personal history of anaphylaxis: Secondary | ICD-10-CM

## 2018-10-06 DIAGNOSIS — F1494 Cocaine use, unspecified with cocaine-induced mood disorder: Secondary | ICD-10-CM

## 2018-10-06 DIAGNOSIS — Z79899 Other long term (current) drug therapy: Secondary | ICD-10-CM | POA: Diagnosis not present

## 2018-10-06 DIAGNOSIS — F419 Anxiety disorder, unspecified: Secondary | ICD-10-CM | POA: Diagnosis not present

## 2018-10-06 DIAGNOSIS — B20 Human immunodeficiency virus [HIV] disease: Secondary | ICD-10-CM | POA: Diagnosis present

## 2018-10-06 DIAGNOSIS — F17210 Nicotine dependence, cigarettes, uncomplicated: Secondary | ICD-10-CM | POA: Diagnosis present

## 2018-10-06 DIAGNOSIS — Z590 Homelessness: Secondary | ICD-10-CM | POA: Diagnosis not present

## 2018-10-06 DIAGNOSIS — F332 Major depressive disorder, recurrent severe without psychotic features: Secondary | ICD-10-CM | POA: Diagnosis not present

## 2018-10-06 DIAGNOSIS — J101 Influenza due to other identified influenza virus with other respiratory manifestations: Secondary | ICD-10-CM | POA: Diagnosis not present

## 2018-10-06 DIAGNOSIS — I10 Essential (primary) hypertension: Secondary | ICD-10-CM | POA: Diagnosis present

## 2018-10-06 DIAGNOSIS — Z880 Allergy status to penicillin: Secondary | ICD-10-CM | POA: Diagnosis not present

## 2018-10-06 DIAGNOSIS — F251 Schizoaffective disorder, depressive type: Secondary | ICD-10-CM | POA: Diagnosis not present

## 2018-10-06 DIAGNOSIS — G4700 Insomnia, unspecified: Secondary | ICD-10-CM | POA: Diagnosis present

## 2018-10-06 DIAGNOSIS — F322 Major depressive disorder, single episode, severe without psychotic features: Secondary | ICD-10-CM | POA: Diagnosis present

## 2018-10-06 DIAGNOSIS — J45909 Unspecified asthma, uncomplicated: Secondary | ICD-10-CM | POA: Diagnosis present

## 2018-10-06 DIAGNOSIS — Z21 Asymptomatic human immunodeficiency virus [HIV] infection status: Secondary | ICD-10-CM | POA: Diagnosis present

## 2018-10-06 MED ORDER — CITALOPRAM HYDROBROMIDE 10 MG PO TABS
10.0000 mg | ORAL_TABLET | Freq: Every day | ORAL | Status: DC
  Administered 2018-10-06 – 2018-10-12 (×7): 10 mg via ORAL
  Filled 2018-10-06 (×6): qty 1
  Filled 2018-10-06: qty 7
  Filled 2018-10-06 (×4): qty 1

## 2018-10-06 MED ORDER — GABAPENTIN 100 MG PO CAPS
100.0000 mg | ORAL_CAPSULE | Freq: Three times a day (TID) | ORAL | Status: DC
  Administered 2018-10-06 – 2018-10-12 (×18): 100 mg via ORAL
  Filled 2018-10-06 (×8): qty 1
  Filled 2018-10-06: qty 21
  Filled 2018-10-06 (×10): qty 1
  Filled 2018-10-06: qty 21
  Filled 2018-10-06: qty 1
  Filled 2018-10-06: qty 21
  Filled 2018-10-06: qty 1

## 2018-10-06 MED ORDER — BUSPIRONE HCL 5 MG PO TABS
5.0000 mg | ORAL_TABLET | Freq: Two times a day (BID) | ORAL | Status: DC
  Administered 2018-10-06 – 2018-10-12 (×12): 5 mg via ORAL
  Filled 2018-10-06 (×3): qty 1
  Filled 2018-10-06: qty 14
  Filled 2018-10-06 (×3): qty 1
  Filled 2018-10-06: qty 14
  Filled 2018-10-06 (×8): qty 1

## 2018-10-06 MED ORDER — NICOTINE 21 MG/24HR TD PT24
21.0000 mg | MEDICATED_PATCH | Freq: Every day | TRANSDERMAL | Status: DC
  Administered 2018-10-06: 21 mg via TRANSDERMAL
  Filled 2018-10-06 (×4): qty 1
  Filled 2018-10-06: qty 14
  Filled 2018-10-06 (×6): qty 1

## 2018-10-06 MED ORDER — INFLUENZA VAC SPLIT QUAD 0.5 ML IM SUSY
0.5000 mL | PREFILLED_SYRINGE | INTRAMUSCULAR | Status: AC
  Administered 2018-10-07: 0.5 mL via INTRAMUSCULAR
  Filled 2018-10-06: qty 0.5

## 2018-10-06 MED ORDER — RISPERIDONE 1 MG PO TABS
1.0000 mg | ORAL_TABLET | Freq: Every day | ORAL | Status: DC
  Administered 2018-10-06 – 2018-10-09 (×4): 1 mg via ORAL
  Filled 2018-10-06: qty 1
  Filled 2018-10-06: qty 7
  Filled 2018-10-06 (×7): qty 1

## 2018-10-06 MED ORDER — TRAZODONE HCL 50 MG PO TABS
50.0000 mg | ORAL_TABLET | Freq: Every evening | ORAL | Status: DC | PRN
  Administered 2018-10-07 – 2018-10-09 (×3): 50 mg via ORAL
  Filled 2018-10-06: qty 7
  Filled 2018-10-06 (×3): qty 1

## 2018-10-06 MED ORDER — PNEUMOCOCCAL VAC POLYVALENT 25 MCG/0.5ML IJ INJ
0.5000 mL | INJECTION | INTRAMUSCULAR | Status: AC
  Administered 2018-10-07: 0.5 mL via INTRAMUSCULAR

## 2018-10-06 MED ORDER — BICTEGRAVIR-EMTRICITAB-TENOFOV 50-200-25 MG PO TABS
1.0000 | ORAL_TABLET | Freq: Every day | ORAL | Status: DC
  Administered 2018-10-06 – 2018-10-12 (×7): 1 via ORAL
  Filled 2018-10-06 (×7): qty 1
  Filled 2018-10-06: qty 7
  Filled 2018-10-06: qty 1

## 2018-10-06 NOTE — [facility] Suicide Risk Assessment (Signed)
[facility] INPATIENT:  Family/Significant Other Suicide Prevention Education  Suicide Prevention Education:  Patient Refusal for Family/Significant Other Suicide Prevention Education: The patient Edwin Martinez has refused to provide written consent for family/significant other to be provided Family/Significant Other Suicide Prevention Education during admission and/or prior to discharge.  Physician notified.  SPE completed with patient, as patient refused to consent to family contact. SPI pamphlet provided to pt and pt was encouraged to share information with support network, ask questions, and talk about any concerns relating to SPE. Patient denies access to guns/firearms and verbalized understanding of information provided. Mobile Crisis information also provided to patient.    [name] 10/06/2018, 3:35 PM

## 2018-10-06 NOTE — ED Notes (Signed)
Patient transported to [facility] via [facility].

## 2018-10-06 NOTE — H&P (Addendum)
Psychiatric Admission Assessment Adult  Patient Identification: Edwin Martinez MRN:  [id number] Date of Evaluation:  10/06/2018 Chief Complaint:  "Worsening depression, suicidal " Principal Diagnosis: Consider Schizoaffective Disorder, Depressed, versus MDD with psychotic features, Cocaine Use Disorder  Diagnosis:  Active Problems:   MDD (major depressive disorder), severe (HCC)  History of Present Illness: 33 year old male,currently homeless, presented to ED voluntarily reporting worsening depression, suicidal ideations, auditory hallucinations, and Excedrin overdose ( reports took about 20 tablets). He attributes worsening depression to significant stressors, mainly homelessness ( states he has been staying in an abandoned house, isolated from other people) and being off psychiatric medications for several weeks to months. He also reports he recently has been abusing crack cocaine 2-3 times a week.  States he has been experiencing intermittent suicidal ideations over recent weeks. Endorses neuro-vegetative symptoms of depression as below. Describes hallucinations as demeaning , insulting.  Associated Signs/Symptoms: Depression Symptoms:  depressed mood, anhedonia, insomnia, suicidal attempt, loss of energy/fatigue, (Hypo) Manic Symptoms:  None noted at this time. Anxiety Symptoms:  Reports increased anxiety Psychotic Symptoms:  Reports auditory hallucinations, describes as demeaning " demonic laughter, telling me I am worthless, useless". PTSD Symptoms: Does not endorse PTSD   Total Time spent with patient: 45 minutes  Past Psychiatric History: history of several prior psychiatric admissions, most recently in June [facility]. At the time presented due to depression and suicidal attempt by overdose. States that in the past he has been diagnosed with Bipolar Disorder and with Schizophrenia. He reports history of depression and history of psychosis ( auditory hallucinations)  mainly when depressed. Endorses brief episodes of increased energy but does not currently endorse any clear history of Mania.  Denies history of violence .  Is the patient at risk to self? Yes.    Has the patient been a risk to self in the past 6 months? Yes.    Has the patient been a risk to self within the distant past? Yes.    Is the patient a risk to others? No.  Has the patient been a risk to others in the past 6 months? No.  Has the patient been a risk to others within the distant past? No.   Prior Inpatient Therapy:  as above  Prior Outpatient Therapy:  no current outpatient psychiatric treatment  Alcohol Screening: 1. How often do you have a drink containing alcohol?: Monthly or less 2. How many drinks containing alcohol do you have on a typical day when you are drinking?: 1 or 2 3. How often do you have six or more drinks on one occasion?: Less than monthly AUDIT-C Score: 2 4. How often during the last year have you found that you were not able to stop drinking once you had started?: Never 5. How often during the last year have you failed to do what was normally expected from you becasue of drinking?: Never 6. How often during the last year have you needed a first drink in the morning to get yourself going after a heavy drinking session?: Never 7. How often during the last year have you had a feeling of guilt of remorse after drinking?: Never 8. How often during the last year have you been unable to remember what happened the night before because you had been drinking?: Never 9. Have you or someone else been injured as a result of your drinking?: No 10. Has a relative or friend or a doctor or another health worker been concerned about  your drinking or suggested you cut down?: No Alcohol Use Disorder Identification Test Final Score (AUDIT): 2 Substance Abuse History in the last 12 months: denies alcohol abuse, describes history of Cocaine Abuse, and states he has been using Cocaine  1-2 x week recently. Denies other drug abuse . Consequences of Substance Abuse: Denies  Previous Psychotropic Medications: reports he has been off his psychiatric medications for several month. Most recent psychiatric medications were Risperidone, Trazodone, Gabapentin, Buspar, Celexa States was tolerating these medications well . Psychological Evaluations:  No  Past Medical History: History of HIV, Hep C (+)  reports prior history of seizures, but none in more than a year.  States he had been off Biktarvy for a period of several weeks, but restarted it 3-4 weeks ago. Follows up at [facility] in [place] for outpatient HIV treatment.  Past Medical History:  Diagnosis Date  . ADD (attention deficit disorder)   . Anxiety   . Asthma   . Bipolar 1 disorder (HCC)   . Depression   . HIV (human immunodeficiency virus infection) (HCC) dx'd 2008  . Hypertension   . Insomnia   . Intentional drug overdose (HCC)    [name] 02/25/2018  . Polysubstance abuse (HCC)    [name] 02/25/2018  . Schizophrenia (HCC)   . Seizures (HCC)    "used to have little black-out szs where I'd drop out for 2-3 min then come back; nothing in the last 2-3-4years" (02/25/2018)  . Shingles     Past Surgical History:  Procedure Laterality Date  . DENTAL SURGERY     "had my eye teeth pulled down"   Family History: biological parents, father died from Huntington's Disease, mother died from MI, has one brother Family History  Problem Relation Age of Onset  . Huntington's disease Father   . Heart disease Mother   . Suicidality Maternal Uncle   . Suicidality Maternal Grandmother    Family Psychiatric  History: mother had history of depression, maternal uncle and grandmother committed suicide. Tobacco Screening:  1/2 PPD Social History: 23, no children, homeless, no current source of income, no legal issues. Social History   Substance and Sexual Activity  Alcohol Use Yes  . Alcohol/week: 4.0 standard drinks  . Types: 4  Cans of beer per week   Comment: occasionally, "socially"     Social History   Substance and Sexual Activity  Drug Use Yes  . Types: Marijuana, Cocaine   Comment: 10/06/2018 "self medicating occassionally"    Additional Social History:  Allergies:   Allergies  Allergen Reactions  . Magnesium-Containing Compounds Other (See Comments)    This medication is contraindicated with pts HIV meds.    . Peanut-Containing Drug Products Anaphylaxis  . Esomeprazole Magnesium Cough  . Atripla [Efavirenz-Emtricitab-Tenofovir] Other (See Comments)    Reaction:  Suicidal thoughts   . Atripla [Efavirenz-Emtricitab-Tenofovir]   . Bactrim [Sulfamethoxazole-Trimethoprim]   . Magnesium-Containing Compounds   . Nexium [Esomeprazole Magnesium]   . Peanut-Containing Drug Products   . Penicillins   . Bactrim [Sulfamethoxazole-Trimethoprim] Rash  . Penicillins Rash and Other (See Comments)    Has patient had a PCN reaction causing immediate rash, facial/tongue/throat swelling, SOB or lightheadedness with hypotension: Yes Has patient had a PCN reaction causing severe rash involving mucus membranes or skin necrosis: No Has patient had a PCN reaction that required hospitalization No Has patient had a PCN reaction occurring within the last 10 years: No If all of the above answers are "NO", then may proceed with Cephalosporin  use.   Lab Results:  Results for orders placed or performed during the hospital encounter of 10/05/18 (from the past 48 hour(s))  Rapid urine drug screen (hospital performed)     Status: Abnormal   Collection Time: 10/05/18  6:38 AM  Result Value Ref Range   Opiates NONE DETECTED NONE DETECTED   Cocaine POSITIVE (A) NONE DETECTED   Benzodiazepines NONE DETECTED NONE DETECTED   Amphetamines NONE DETECTED NONE DETECTED   Tetrahydrocannabinol NONE DETECTED NONE DETECTED   Barbiturates NONE DETECTED NONE DETECTED    Comment: (NOTE) DRUG SCREEN FOR MEDICAL PURPOSES ONLY.  IF  CONFIRMATION IS NEEDED FOR ANY PURPOSE, NOTIFY LAB WITHIN 5 DAYS. LOWEST DETECTABLE LIMITS FOR URINE DRUG SCREEN Drug Class                     Cutoff (ng/mL) Amphetamine and metabolites    1000 Barbiturate and metabolites    200 Benzodiazepine                 200 Tricyclics and metabolites     300 Opiates and metabolites        300 Cocaine and metabolites        300 THC                            50 Performed at [facility], [address], [place], [place] [address]   Comprehensive metabolic panel     Status: Abnormal   Collection Time: 10/05/18  6:44 AM  Result Value Ref Range   Sodium 139 135 - 145 mmol/L   Potassium 3.5 3.5 - 5.1 mmol/L   Chloride 105 98 - 111 mmol/L   CO2 25 22 - 32 mmol/L   Glucose, Bld 82 70 - 99 mg/dL   BUN 10 6 - 20 mg/dL   Creatinine, Ser 3.84 0.61 - 1.24 mg/dL   Calcium 9.3 8.9 - 66.5 mg/dL   Total Protein 7.1 6.5 - 8.1 g/dL   Albumin 3.9 3.5 - 5.0 g/dL   AST 90 (H) 15 - 41 U/L   ALT 111 (H) 0 - 44 U/L   Alkaline Phosphatase 65 38 - 126 U/L   Total Bilirubin 0.9 0.3 - 1.2 mg/dL   GFR calc non Af Amer >60 >60 mL/min   GFR calc Af Amer >60 >60 mL/min   Anion gap 9 5 - 15    Comment: Performed at [facility] Lab, [address], [place], [place] [address]  Ethanol     Status: None   Collection Time: 10/05/18  6:44 AM  Result Value Ref Range   Alcohol, Ethyl (B) <10 <10 mg/dL    Comment: (NOTE) Lowest detectable limit for serum alcohol is 10 mg/dL. For medical purposes only. Performed at [facility], [address], [place], [place] [address]   Salicylate level     Status: None   Collection Time: 10/05/18  6:44 AM  Result Value Ref Range   Salicylate Lvl <7.0 2.8 - 30.0 mg/dL    Comment: Performed at [facility], [address] [place], [place] [address]  Acetaminophen level     Status: None   Collection Time: 10/05/18  6:44 AM  Result Value Ref Range   Acetaminophen (Tylenol), Serum 13 10 - 30 ug/mL    Comment:  (NOTE) Therapeutic concentrations vary significantly. A range of 10-30 ug/mL  may be an effective concentration  for many patients. However, some  are best treated at concentrations outside of this range. Acetaminophen concentrations >150 ug/mL at 4 hours after ingestion  and >50 ug/mL at 12 hours after ingestion are often associated with  toxic reactions. Performed at [facility], [address] [place], [place] [address]   cbc     Status: None   Collection Time: 10/05/18  6:44 AM  Result Value Ref Range   WBC 9.2 4.0 - 10.5 K/uL   RBC 5.23 4.22 - 5.81 MIL/uL   Hemoglobin 14.5 13.0 - 17.0 g/dL   HCT 60.4 54.0 - 98.1 %   MCV 87.6 80.0 - 100.0 fL   MCH 27.7 26.0 - 34.0 pg   MCHC 31.7 30.0 - 36.0 g/dL   RDW 19.1 47.8 - 29.5 %   Platelets 271 150 - 400 K/uL   nRBC 0.0 0.0 - 0.2 %    Comment: Performed at [facility], [address] [place], [place] [address]  CBG monitoring, ED     Status: None   Collection Time: 10/05/18  7:32 AM  Result Value Ref Range   Glucose-Capillary 72 70 - 99 mg/dL   Comment 1 Notify RN    Comment 2 Document in Chart   Acetaminophen level     Status: None   Collection Time: 10/05/18 10:13 AM  Result Value Ref Range   Acetaminophen (Tylenol), Serum 19 10 - 30 ug/mL    Comment: (NOTE) Therapeutic concentrations vary significantly. A range of 10-30 ug/mL  may be an effective concentration for many patients. However, some  are best treated at concentrations outside of this range. Acetaminophen concentrations >150 ug/mL at 4 hours after ingestion  and >50 ug/mL at 12 hours after ingestion are often associated with  toxic reactions. Performed at [facility] Lab, [address] [place], [place] [address]   Salicylate level     Status: None   Collection Time: 10/05/18 12:00 PM  Result Value Ref Range   Salicylate Lvl 11.5 2.8 - 30.0 mg/dL    Comment: Performed at [facility] Lab, [address] [place], [place] [address]  Basic  metabolic panel     Status: Abnormal   Collection Time: 10/05/18 12:00 PM  Result Value Ref Range   Sodium 140 135 - 145 mmol/L   Potassium 3.5 3.5 - 5.1 mmol/L   Chloride 112 (H) 98 - 111 mmol/L   CO2 20 (L) 22 - 32 mmol/L   Glucose, Bld 87 70 - 99 mg/dL   BUN 9 6 - 20 mg/dL   Creatinine, Ser 9.52 0.61 - 1.24 mg/dL   Calcium 9.2 8.9 - 84.1 mg/dL   GFR calc non Af Amer >60 >60 mL/min   GFR calc Af Amer >60 >60 mL/min   Anion gap 8 5 - 15    Comment: Performed at [facility], [address], [place], [place] [address]  Salicylate level     Status: None   Collection Time: 10/05/18  3:00 PM  Result Value Ref Range   Salicylate Lvl 8.7 2.8 - 30.0 mg/dL    Comment: Performed at [facility] Lab, [address] [place], [place] [address]  Salicylate level     Status: None   Collection Time: 10/05/18  8:13 PM  Result Value Ref Range   Salicylate Lvl <7.0 2.8 - 30.0 mg/dL    Comment: Performed at [facility] Lab, [address], [place], [place]  [id number]    Blood Alcohol level:  Lab Results  Component Value Date   ETH <10 10/05/2018   ETH <10 06/19/2018    Metabolic Disorder Labs:  Lab Results  Component Value Date   HGBA1C 5.6 12/24/2016   MPG 114 12/24/2016   MPG 111 05/04/2016   Lab Results  Component Value Date   PROLACTIN 28.8 (H) 12/24/2016   PROLACTIN 32.3 (H) 08/19/2016   Lab Results  Component Value Date   CHOL 147 08/31/2018   TRIG 189 (H) 08/31/2018   HDL 45 08/31/2018   CHOLHDL 3.3 08/31/2018   VLDL 26 12/24/2016   LDLCALC 74 08/31/2018   LDLCALC 72 04/06/2018    Current Medications: Current Facility-Administered Medications  Medication Dose Route Frequency Provider Last Rate Last Dose  . [START ON 10/07/2018] Influenza vac split quadrivalent PF (FLUARIX) injection 0.5 mL  0.5 mL Intramuscular Tomorrow-1000 Cobos, Fernando A, MD      . nicotine (NICODERM CQ - dosed in mg/24 hours) patch 21 mg  21 mg Transdermal Daily Cobos, Rockey SituFernando A, MD       . Melene Muller[START ON 10/07/2018] pneumococcal 23 valent vaccine (PNU-IMMUNE) injection 0.5 mL  0.5 mL Intramuscular Tomorrow-1000 Cobos, Rockey SituFernando A, MD       PTA Medications: Medications Prior to Admission  Medication Sig Dispense Refill Last Dose  . benztropine (COGENTIN) 0.5 MG tablet Take 1 tablet (0.5 mg total) by mouth at bedtime. (Patient not taking: Reported on 10/05/2018) 30 tablet 0 Not Taking at Unknown time  . bictegravir-emtricitabine-tenofovir AF (BIKTARVY) 50-200-25 MG TABS tablet Take 1 tablet by mouth daily. (Patient not taking: Reported on 10/05/2018) 30 tablet 0 Not Taking at Unknown time  . gabapentin (NEURONTIN) 300 MG capsule Take 1 capsule (300 mg total) by mouth 3 (three) times daily. For pain (Patient not taking: Reported on 10/05/2018) 90 capsule 0 Not Taking at Unknown time  . hydrOXYzine (ATARAX/VISTARIL) 25 MG tablet Take 1 tablet (25 mg total) by mouth 3 (three) times daily as needed for anxiety or nausea. (Patient not taking: Reported on 10/05/2018) 30 tablet 0 Not Taking at Unknown time  . risperiDONE (RISPERDAL) 1 MG tablet Take 1 tablet (1 mg total) by mouth 2 (two) times daily. For mood control (Patient not taking: Reported on 10/05/2018) 60 tablet 0 Not Taking at Unknown time  . traZODone (DESYREL) 50 MG tablet Take 1 tablet (50 mg total) by mouth at bedtime as needed for sleep. (Patient not taking: Reported on 10/05/2018) 30 tablet 0 Not Taking at Unknown time    Musculoskeletal: Strength & Muscle Tone: within normal limits Gait & Station: normal Patient leans: N/A  Psychiatric Specialty Exam: Physical Exam  Review of Systems  Constitutional: Negative for chills, fever and weight loss.  HENT: Negative.   Eyes: Negative.   Respiratory: Negative.   Cardiovascular: Negative.   Gastrointestinal: Negative for nausea and vomiting.  Genitourinary: Negative.   Musculoskeletal: Negative.   Skin: Negative.  Negative for rash.  Neurological: Positive for seizures. Negative for  focal weakness and headaches.       Denies any abnormal or involuntary movements   Endo/Heme/Allergies: Negative.   Psychiatric/Behavioral: Positive for depression.    Blood pressure 137/75, pulse 68, temperature 97.6 F (36.4 C), temperature source Oral, resp. rate 16, height 5\' 7"  (1.702 m), weight 68.9 kg, SpO2 99 %.Body mass index is 23.81 kg/m.  General Appearance: Fairly Groomed  Eye Contact:  Good  Speech:  Normal Rate  Volume:  Normal  Mood:  reports partially improved mood, and describes mood as 5/10  Affect:  constricted, but smiles at times appropriately  Thought Process:  Linear and Descriptions of Associations: Intact  Orientation:  Other:  fully alert and attentive  Thought Content:  reports auditory hallucinations as described above, states last experienced hallucinations yesterday. Not internally preoccupied at this time. No delusions expressed  Suicidal Thoughts:  No denies suicidal ideations at this time , contracts for safety on unit   Homicidal Thoughts:  No no homicidal ideations  Memory:  recent and remote grossly intact   Judgement:  Fair  Insight:  Fair  Psychomotor Activity:  Normal  Concentration:  Concentration: Good and Attention Span: Good  Recall:  Good  Fund of Knowledge:  Good  Language:  Good  Akathisia:  Negative  Handed:  Right  AIMS (if indicated):     Assets:  Communication Skills Desire for Improvement Resilience  ADL's:  Intact  Cognition:  WNL  Sleep:       Treatment Plan Summary: Daily contact with patient to assess and evaluate symptoms and progress in treatment, Medication management, Plan inpatient treatment  and medications as below  Observation Level/Precautions:  15 minute checks  Laboratory:  as needed   Psychotherapy: milieu, group therapy   Medications:  We discussed options. Patient reports history of good response to Celexa, Risperidone, Buspar in the past . Does not remember having had side effects.Charissa Bash  for HIV management Restart Celexa 10 mgrs QDAY  Restart Risperidone 1 mgr QHS Restart Buspar 5 mgrs BID Trazodone 50 mgrs QHS PRN for insomnia  Consultations:  As needed  Discharge Concerns:-  Homelessness, chronic illness   Estimated LOS: -  Other:     Physician Treatment Plan for Primary Diagnosis: MDD with Psychotic Features versus Schiozaffective Disorder, Depressed Long Term Goal(s): Improvement in symptoms so as ready for discharge  Short Term Goals: Ability to identify changes in lifestyle to reduce recurrence of condition will improve and Ability to maintain clinical measurements within normal limits will improve  Physician Treatment Plan for Secondary Diagnosis: Cocaine Use Disorder Long Term Goal(s): Improvement in symptoms so as ready for discharge  Short Term Goals: Ability to identify changes in lifestyle to reduce recurrence of condition will improve and Ability to identify triggers associated with substance abuse/mental health issues will improve  I certify that inpatient services furnished can reasonably be expected to improve the patient's condition.    Craige Cotta, MD 1/9/20201:27 PM

## 2018-10-06 NOTE — ED Notes (Signed)
Patient accepted at [facility] - room 406-2, may go around 9am today.
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
## 2018-10-06 NOTE — [facility] Suicide Risk Assessment (Signed)
[facility] Admission Suicide Risk Assessment   Nursing information obtained from:  Patient Demographic factors:  Male, Adolescent or young adult, Gay, lesbian, or bisexual orientation, Unemployed, Low socioeconomic status, Living alone Current Mental Status:  Suicidal ideation indicated by patient, Suicide plan, Self-harm thoughts, Intention to act on suicide plan, Plan includes specific time, place, or method, Self-harm behaviors, Belief that plan would result in death Loss Factors:  Decline in physical health, Financial problems / change in socioeconomic status Historical Factors:  Prior suicide attempts, Family history of mental illness or substance abuse, Impulsivity Risk Reduction Factors:  Positive coping skills or problem solving skills  Total Time spent with patient: 45 minutes Principal Problem:  MDD with psychotic features versus Schizoaffective Disorder, Cocaine Use Disorder Diagnosis:  Active Problems:   MDD (major depressive disorder), severe (HCC)  Subjective Data:   Continued Clinical Symptoms:  Alcohol Use Disorder Identification Test Final Score (AUDIT): 2 The "Alcohol Use Disorders Identification Test", Guidelines for Use in Primary Care, Second Edition.  World Science writer [facility]). Score between 0-7:  no or low risk or alcohol related problems. Score between 8-15:  moderate risk of alcohol related problems. Score between 16-19:  high risk of alcohol related problems. Score 20 or above:  warrants further diagnostic evaluation for alcohol dependence and treatment.   CLINICAL FACTORS:  33 year old male, presented to the hospital reporting worsening depression, auditory hallucinations which she describes as insulting, demeaning.  Suicidal ideations and recent overdose on Excedrin. He attributes worsening depression to significant stressors which include homelessness, social isolation, lack of income, being off psychiatric medications for several weeks to months.  He also  endorses cocaine abuse several times a week.  Patient is HIV positive, reports he had been off his antiretroviral medication up to 2 or 3 weeks ago when he was restarted on it.   Psychiatric Specialty Exam: Physical Exam  ROS  Blood pressure 137/75, pulse 68, temperature 97.6 F (36.4 C), temperature source Oral, resp. rate 16, height 5\' 7"  (1.702 m), weight 68.9 kg, SpO2 99 %.Body mass index is 23.81 kg/m.  See admit note mental status exam  COGNITIVE FEATURES THAT CONTRIBUTE TO RISK:  Closed-mindedness and Loss of executive function    SUICIDE RISK:   Moderate:  Frequent suicidal ideation with limited intensity, and duration, some specificity in terms of plans, no associated intent, good self-control, limited dysphoria/symptomatology, some risk factors present, and identifiable protective factors, including available and accessible social support.  PLAN OF CARE: Patient will be admitted to inpatient psychiatric unit for stabilization and safety. Will provide and encourage milieu participation. Provide medication management and maked adjustments as needed.  Will follow daily.    I certify that inpatient services furnished can reasonably be expected to improve the patient's condition.   Craige Cotta, MD 10/06/2018, 2:12 PM

## 2018-10-06 NOTE — BHH Group Notes (Signed)
LCSW Group Therapy Note 10/06/2018 2:26 PM  Type of Therapy/Topic: Group Therapy: Feelings about Diagnosis  Participation Level: Minimal   Description of Group:  This group will allow patients to explore their thoughts and feelings about diagnoses they have received. Patients will be guided to explore their level of understanding and acceptance of these diagnoses. Facilitator will encourage patients to process their thoughts and feelings about the reactions of others to their diagnosis and will guide patients in identifying ways to discuss their diagnosis with significant others in their lives. This group will be process-oriented, with patients participating in exploration of their own experiences, giving and receiving support, and processing challenge from other group members.  Therapeutic Goals: 1. Patient will demonstrate understanding of diagnosis as evidenced by identifying two or more symptoms of the disorder 2. Patient will be able to express two feelings regarding the diagnosis 3. Patient will demonstrate their ability to communicate their needs through discussion and/or role play  Summary of Patient Progress:  Edwin Martinez was excused from the group session by the MD.     Therapeutic Modalities:  Cognitive Behavioral Therapy Brief Therapy Feelings Identification    Edwin Martinez Catalina Antigua Clinical Social Worker

## 2018-10-06 NOTE — Plan of Care (Signed)
  Problem: Activity: Goal: Sleeping patterns will improve Outcome: Progressing   Problem: Coping: Goal: Ability to demonstrate self-control will improve Outcome: Progressing   Problem: Health Behavior/Discharge Planning: Goal: Compliance with treatment plan for underlying cause of condition will improve Outcome: Progressing   Problem: Safety: Goal: Periods of time without injury will increase Outcome: Progressing

## 2018-10-06 NOTE — Progress Notes (Signed)
Pt accepted to [facility], Bed 406-2 Hillery Jacks, NP is the accepting provider.  Nehemiah Massed, MD is the attending provider.  Call report to  [phone]  [facility] ED notified.   Pt is Voluntary.  Pt may be transported by [facility]  Pt scheduled  to arrive at [facility] at 9:00 AM  Carney Bern T. Kaylyn Lim, MSW, LCSWA Disposition Clinical Social Work [phone] (cell) [phone] (office)

## 2018-10-06 NOTE — Progress Notes (Signed)
D: Pt denies SI/HI A/V hallucinations. Patient has been in milieu with peers watching television.  Pt is pleasant and cooperative and friendly. Pt goal for today is to work on staying on his medications and talk with Child psychotherapist. A: Pt was offered support and encouragement. Pt was given scheduled medications. Pt was encourage to attend groups. Q 15 minute checks were done for safety.  R:Pt attends groups and interacts well with peers and staff. Pt is taking medication. Pt has no complaints.Pt receptive to treatment and safety maintained on unit.
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
## 2018-10-06 NOTE — Tx Team (Signed)
Initial Treatment Plan 10/06/2018 12:33 PM Edwin Martinez SWH:[id number]    PATIENT STRESSORS: Financial difficulties Health problems Medication change or noncompliance   PATIENT STRENGTHS: Ability for insight Active sense of humor Average or above average intelligence Capable of independent living   PATIENT IDENTIFIED PROBLEMS: Medication management  Calming the voices  Living situation                 DISCHARGE CRITERIA:  Ability to meet basic life and health needs Adequate post-discharge living arrangements Improved stabilization in mood, thinking, and/or behavior Medical problems require only outpatient monitoring  PRELIMINARY DISCHARGE PLAN: Return to previous living arrangement  PATIENT/FAMILY INVOLVEMENT: This treatment plan has been presented to and reviewed with the patient, Edwin Martinez.  The patient and family have been given the opportunity to ask questions and make suggestions.  Raylene Miyamoto, RN 10/06/2018, 12:33 PM

## 2018-10-07 DIAGNOSIS — F1414 Cocaine abuse with cocaine-induced mood disorder: Secondary | ICD-10-CM

## 2018-10-07 DIAGNOSIS — B20 Human immunodeficiency virus [HIV] disease: Secondary | ICD-10-CM

## 2018-10-07 DIAGNOSIS — F332 Major depressive disorder, recurrent severe without psychotic features: Secondary | ICD-10-CM

## 2018-10-07 DIAGNOSIS — G4700 Insomnia, unspecified: Secondary | ICD-10-CM

## 2018-10-07 MED ORDER — GUAIFENESIN ER 600 MG PO TB12
1200.0000 mg | ORAL_TABLET | Freq: Two times a day (BID) | ORAL | Status: DC | PRN
  Administered 2018-10-07: 1200 mg via ORAL
  Filled 2018-10-07: qty 2

## 2018-10-07 NOTE — Progress Notes (Addendum)
[facility]  10/07/2018 11:04 AM Edwin Martinez  MRN:  [id number] Subjective:  "I'm doing pretty good."  [name] found socializing in dayroom. States depression is improving, currently rates depression 5/10. States depressed mood is largely related to homelessness and to most of his family being deceased. Reports he has been living alone in an abandoned building and using cocaine 2-3 times per week for the last several months. Denies AH on assessment today. Reports intermittent AH were last heard yesterday- demonic voices insulting him "You're worthless. You might as well die."  Denies CAH on assessment but reports CAH to hurt self prior to hospitalization. Denies SI and contracts for safety on the unit. Denies withdrawal symptoms or medication side effects. States desire for rehab. Support and encouragement provided.  From admission H&P: 33 year old male,currently homeless, presented to ED voluntarily reporting worsening depression, suicidal ideations, auditory hallucinations, and Excedrin overdose ( reports took about 20 tablets). He attributes worsening depression to significant stressors, mainly homelessness ( states he has been staying in an abandoned house, isolated from other people) and being off psychiatric medications for several weeks to months. He also reports he recently has been abusing crack cocaine 2-3 times Martinez week. States he has been experiencing intermittent suicidal ideations over recent weeks. Endorses neuro-vegetative symptoms of depression as below. Describes hallucinations as demeaning , insulting.  Principal Problem: Cocaine abuse with cocaine-induced mood disorder (HCC) Diagnosis: Principal Problem:   Cocaine abuse with cocaine-induced mood disorder (HCC) Active Problems:   Schizoaffective disorder (HCC)   MDD (major depressive disorder), severe (HCC)  Total Time spent with patient: 15 minutes  Past Psychiatric History: See admission H&P  Past Medical  History:  Past Medical History:  Diagnosis Date  . ADD (attention deficit disorder)   . Anxiety   . Asthma   . Bipolar 1 disorder (HCC)   . Depression   . HIV (human immunodeficiency virus infection) (HCC) dx'd 2008  . Hypertension   . Insomnia   . Intentional drug overdose (HCC)    [name]/notes 02/25/2018  . Polysubstance abuse (HCC)    [name]/notes 02/25/2018  . Schizophrenia (HCC)   . Seizures (HCC)    "used to have little black-out szs where I'd drop out for 2-3 min then come back; nothing in the last 2-3-4years" (02/25/2018)  . Shingles     Past Surgical History:  Procedure Laterality Date  . DENTAL SURGERY     "had my eye teeth pulled down"   Family History:  Family History  Problem Relation Age of Onset  . Huntington's disease Father   . Heart disease Mother   . Suicidality Maternal Uncle   . Suicidality Maternal Grandmother    Family Psychiatric  History: See admission H&P Social History:  Social History   Substance and Sexual Activity  Alcohol Use Yes  . Alcohol/week: 4.0 standard drinks  . Types: 4 Cans of beer per week   Comment: occasionally, "socially"     Social History   Substance and Sexual Activity  Drug Use Yes  . Types: Marijuana, Cocaine   Comment: 10/06/2018 "self medicating occassionally"    Social History   Socioeconomic History  . Marital status: Single    Spouse name: Not on file  . Number of children: Not on file  . Years of education: Not on file  . Highest education level: Not on file  Occupational History  . Occupation: disability pending  Social Needs  . Financial resource strain: Not on file  .  Food insecurity:    Worry: Not on file    Inability: Not on file  . Transportation needs:    Medical: Not on file    Non-medical: Not on file  Tobacco Use  . Smoking status: Current Every Day Smoker    Packs/day: 0.50    Years: 20.00    Pack years: 10.00    Types: Cigarettes    Start date: 09/29/1991  . Smokeless tobacco: Never Used   Substance and Sexual Activity  . Alcohol use: Yes    Alcohol/week: 4.0 standard drinks    Types: 4 Cans of beer per week    Comment: occasionally, "socially"  . Drug use: Yes    Types: Marijuana, Cocaine    Comment: 10/06/2018 "self medicating occassionally"  . Sexual activity: Yes    Birth control/protection: Condom  Lifestyle  . Physical activity:    Days per week: Not on file    Minutes per session: Not on file  . Stress: Not on file  Relationships  . Social connections:    Talks on phone: Not on file    Gets together: Not on file    Attends religious service: Not on file    Active member of club or organization: Not on file    Attends meetings of clubs or organizations: Not on file    Relationship status: Not on file  Other Topics Concern  . Not on file  Social History Narrative   ** Merged History Encounter **       ** Merged History Encounter **       Additional Social History:                         Sleep: Good  Appetite:  Good  Current Medications: Current Facility-Administered Medications  Medication Dose Route Frequency Provider Last Rate Last Dose  . bictegravir-emtricitabine-tenofovir AF (BIKTARVY) 50-200-25 MG per tablet 1 tablet  1 tablet Oral Daily Martinez, Edwin SituFernando A, MD   1 tablet at 10/07/18 0926  . busPIRone (BUSPAR) tablet 5 mg  5 mg Oral BID Martinez, Edwin SituFernando A, MD   5 mg at 10/07/18 0926  . citalopram (CELEXA) tablet 10 mg  10 mg Oral Daily Martinez, Edwin SituFernando A, MD   10 mg at 10/07/18 0927  . gabapentin (NEURONTIN) capsule 100 mg  100 mg Oral TID Martinez, Edwin SituFernando A, MD   100 mg at 10/07/18 0927  . Influenza vac split quadrivalent PF (FLUARIX) injection 0.5 mL  0.5 mL Intramuscular Tomorrow-1000 Martinez, Edwin A, MD      . nicotine (NICODERM CQ - dosed in mg/24 hours) patch 21 mg  21 mg Transdermal Daily Martinez, Edwin SituFernando A, MD   21 mg at 10/06/18 1423  . pneumococcal 23 valent vaccine (PNU-IMMUNE) injection 0.5 mL  0.5 mL Intramuscular  Tomorrow-1000 Martinez, Edwin A, MD      . risperiDONE (RISPERDAL) tablet 1 mg  1 mg Oral QHS Martinez, Edwin SituFernando A, MD   1 mg at 10/06/18 2115  . traZODone (DESYREL) tablet 50 mg  50 mg Oral QHS PRN Martinez, Edwin SituFernando A, MD        Lab Results:  Results for orders placed or performed during the hospital encounter of 10/05/18 (from the past 48 hour(s))  Salicylate level     Status: None   Collection Time: 10/05/18 12:00 PM  Result Value Ref Range   Salicylate Lvl 11.5 2.8 - 30.0 mg/dL    Comment: Performed at [facility], [address] [place], [place] [address]  Basic metabolic panel     Status: Abnormal   Collection Time: 10/05/18 12:00 PM  Result Value Ref Range   Sodium 140 135 - 145 mmol/L   Potassium 3.5 3.5 - 5.1 mmol/L   Chloride 112 (H) 98 - 111 mmol/L   CO2 20 (L) 22 - 32 mmol/L   Glucose, Bld 87 70 - 99 mg/dL   BUN 9 6 - 20 mg/dL   Creatinine, Ser 3.66 0.61 - 1.24 mg/dL   Calcium 9.2 8.9 - 29.4 mg/dL   GFR calc non Af Amer >60 >60 mL/min   GFR calc Af Amer >60 >60 mL/min   Anion gap 8 5 - 15    Comment: Performed at [facility], [address] [place], [place] [address]  Salicylate level     Status: None   Collection Time: 10/05/18  3:00 PM  Result Value Ref Range   Salicylate Lvl 8.7 2.8 - 30.0 mg/dL    Comment: Performed at [facility], [address] [place], [place] [address]  Salicylate level     Status: None   Collection Time: 10/05/18  8:13 PM  Result Value Ref Range   Salicylate Lvl <7.0 2.8 - 30.0 mg/dL    Comment: Performed at [facility], [address] [place], [place] [address]    Blood Alcohol level:  Lab Results  Component Value Date   [facility] <10 10/05/2018   ETH <10 06/19/2018    Metabolic Disorder Labs: Lab Results  Component Value Date   HGBA1C 5.6 12/24/2016   MPG 114 12/24/2016   MPG 111 05/04/2016   Lab Results  Component Value Date   PROLACTIN 28.8 (H) 12/24/2016   PROLACTIN 32.3 (H) 08/19/2016   Lab  Results  Component Value Date   CHOL 147 08/31/2018   TRIG 189 (H) 08/31/2018   HDL 45 08/31/2018   CHOLHDL 3.3 08/31/2018   VLDL 26 12/24/2016   LDLCALC 74 08/31/2018   LDLCALC 72 04/06/2018    Physical Findings: AIMS: Facial and Oral Movements Muscles of Facial Expression: None, normal Lips and Perioral Area: None, normal Jaw: None, normal Tongue: None, normal,Extremity Movements Upper (arms, wrists, hands, fingers): None, normal Lower (legs, knees, ankles, toes): None, normal, Trunk Movements Neck, shoulders, hips: None, normal, Overall Severity Severity of abnormal movements (highest score from questions above): None, normal Incapacitation due to abnormal movements: None, normal Patient's awareness of abnormal movements (rate only patient's report): No Awareness, Dental Status Current problems with teeth and/or dentures?: Yes Does patient usually wear dentures?: No  CIWA:    COWS:     Musculoskeletal: Strength & Muscle Tone: within normal limits Gait & Station: normal Patient leans: N/Martinez  Psychiatric Specialty Exam: Physical Exam  Nursing note and vitals reviewed. Constitutional: He is oriented to person, place, and time. He appears well-developed and well-nourished.  Cardiovascular: Normal rate.  Respiratory: Effort normal.  Neurological: He is alert and oriented to person, place, and time.    Review of Systems  Constitutional: Negative.   Respiratory: Negative.   Cardiovascular: Negative.   Gastrointestinal: Negative.   Neurological: Negative.   Psychiatric/Behavioral: Positive for depression, hallucinations (intermittent AH last heard yesterday) and substance abuse (hx cocaine). Negative for memory loss and suicidal ideas. The patient is not nervous/anxious and does not have insomnia.     Blood pressure 137/75, pulse 68, temperature 97.6 F (36.4 C), temperature source Oral, resp. rate 16,  height 5\' 7"  (1.702 m), weight 68.9 kg, SpO2 99 %.Body mass index is  23.81 kg/m.  General Appearance: Casual  Eye Contact:  Good  Speech:  Clear and Coherent  Volume:  Normal  Mood:  Euthymic  Affect:  Constricted  Thought Process:  Coherent  Orientation:  Full (Time, Place, and Person)  Thought Content:  Hallucinations: Auditory  Suicidal Thoughts:  No  Homicidal Thoughts:  No  Memory:  Immediate;   Good  Judgement:  Fair  Insight:  Fair  Psychomotor Activity:  Normal  Concentration:  Concentration: Good  Recall:  Good  Fund of Knowledge:  Good  Language:  Good  Akathisia:  No  Handed:  Right  AIMS (if indicated):     Assets:  Communication Skills Desire for Improvement Resilience  ADL's:  Intact  Cognition:  WNL  Sleep:  Number of Hours: 6.75     Treatment Plan Summary: Daily contact with patient to assess and evaluate symptoms and progress in treatment and Medication management   Continue inpatient hospitalization.  Depression: Continue Celexa 10 mg PO daily  Anxiety: Continue Buspar 5 mg PO BID  Hallucinations: Continue Risperdal 1 mg PO QHS  Sleep: Continue trazodone 50 mg PO QHS PRN insomnia  Other medical: Continue Biktarvy 50-200-25 mg PO daily for HIV  Patient will participate in the therapeutic group milieu.  Discharge disposition in progress.   Aldean Baker, NP 10/07/2018, 11:04 AM    ..Agree with NP Progress Note

## 2018-10-07 NOTE — Progress Notes (Signed)
Adult Psychoeducational Group Note  Date:  10/07/2018 Time:  9:09 PM  Group Topic/Focus:  Wrap-Up Group:   The focus of this group is to help patients review their daily goal of treatment and discuss progress on daily workbooks.  Participation Level:  Active  Participation Quality:  Appropriate  Affect:  Appropriate  Cognitive:  Alert  Insight: Appropriate  Engagement in Group:  Engaged  Modes of Intervention:  Discussion  Additional Comments:  Patient stated having an okay day. Patient's goal for today was to find housing following discharge. Patient stated that he met his goal.    L  10/07/2018, 9:09 PM 

## 2018-10-07 NOTE — Progress Notes (Signed)
Recreation Therapy Notes  Date: 1.10.20 Time: 0930 Location: [address] Dayroom  Group Topic: Stress Management  Goal Area(s) Addresses:  Patient will identify ways to cope with stress.  Patient will identify stress management techniques. Patient will identify benefits of using stress management post d/c.  Intervention: Stress Management  Activity :  Meditation.  LRT introduced the stress management technique of meditation.  LRT played a meditation that allowed patients to focus on resiliency. Patients were to follow along as meditation played in order to engage in activity.  Education:  Stress Management, Discharge Planning.   Education Outcome: Acknowledges Education  Clinical Observations/Feedback: Pt did not attend group.    Jotham Ahn, LRT/CTRS         Luberta Grabinski A 10/07/2018 11:48 AM 

## 2018-10-07 NOTE — Progress Notes (Signed)
Patient ID: Edwin Martinez, male   DOB: 1986/03/15, 33 y.o.   MRN: [id number]  Pt currently presents with a pleasant affect and isolative behavior. Pt reports to writer that their goal is to "get some rest." Pt reports feeling tired this morning. Pt reports good sleep with current medication regimen. Adheres to medication regimen however refuses the nicotine patch because it "won't stay on."  Pt provided with medications per providers orders. Pt's labs and vitals were monitored throughout the night. Pt supported emotionally and encouraged to express concerns and questions. Pt educated on medications.  Pt's safety ensured with 15 minute and environmental checks. Pt currently denies SI/HI and A/V hallucinations. Pt verbally agrees to seek staff if SI/HI or A/VH occurs and to consult with staff before acting on any harmful thoughts. Will continue POC.

## 2018-10-07 NOTE — [facility] Group Notes (Signed)
Adult Psychoeducational Group Note  Date:  10/07/2018 Time:  5:50 AM  Group Topic/Focus:  Wrap-Up Group:   The focus of this group is to help patients review their daily goal of treatment and discuss progress on daily workbooks.  Participation Level:  Active  Participation Quality:  Appropriate and Attentive  Affect:  Appropriate  Cognitive:  Alert and Appropriate  Insight: Appropriate and Good  Engagement in Group:  Engaged  Modes of Intervention:  Discussion and Education  Additional Comments:  Pt attended and participated in wrap up group this evening. Pt rated their day a 5/10, due to them needing to find housing. Pt goal while they are here is to get their meds regulated.   Edwin NettersOctavia A [name] 10/07/2018, 5:50 AM

## 2018-10-07 NOTE — Progress Notes (Signed)
D: Pt was in dayroom upon initial approach.  Pt presents with appropriate affect and mood.  He reports his day was "pretty good" and his goal is to "get into rehab and get housing."  Pt reports he has discussed this with Education officer, museum.  Pt denies SI/HI, denies hallucinations, denies pain.  Pt has been visible in milieu interacting with peers and staff appropriately.  Pt attended evening group.    A: Introduced self to pt.  Met with pt 1:1.  Actively listened to pt and offered support and encouragement.  Medication administered per order.  PRN medication administered for cough and sleep.  Q15 minute safety checks maintained.  R: Pt is safe on the unit.  Pt is compliant with medications.  Pt verbally contracts for safety.  Will continue to monitor and assess.

## 2018-10-08 LAB — INFLUENZA PANEL BY PCR (TYPE A & B)
INFLAPCR: POSITIVE — AB
Influenza B By PCR: NEGATIVE

## 2018-10-08 MED ORDER — LORATADINE 10 MG PO TABS
10.0000 mg | ORAL_TABLET | Freq: Every day | ORAL | Status: DC
  Administered 2018-10-09 – 2018-10-12 (×4): 10 mg via ORAL
  Filled 2018-10-08: qty 1
  Filled 2018-10-08: qty 7
  Filled 2018-10-08 (×5): qty 1

## 2018-10-08 MED ORDER — BENZONATATE 100 MG PO CAPS
100.0000 mg | ORAL_CAPSULE | Freq: Three times a day (TID) | ORAL | Status: DC | PRN
  Administered 2018-10-08: 100 mg via ORAL
  Filled 2018-10-08: qty 1

## 2018-10-08 MED ORDER — IBUPROFEN 600 MG PO TABS
600.0000 mg | ORAL_TABLET | Freq: Four times a day (QID) | ORAL | Status: DC | PRN
  Administered 2018-10-08 – 2018-10-09 (×3): 600 mg via ORAL
  Filled 2018-10-08 (×3): qty 1

## 2018-10-08 NOTE — [facility] Group Notes (Signed)
LCSW Group Therapy Note  10/08/2018   10:00-11:00am   Type of Therapy and Topic:  Group Therapy: Anger Cues and Responses  Participation Level:  Active   Description of Group:   In this group, patients learned how to recognize the physical, cognitive, emotional, and behavioral responses they have to anger-provoking situations.  They identified a recent time they became angry and how they reacted.  They analyzed how their reaction was possibly beneficial and how it was possibly unhelpful.  The group discussed a variety of healthier coping skills that could help with such a situation in the future.  Deep breathing was practiced briefly.  Therapeutic Goals: 1. Patients will remember their last incident of anger and how they felt emotionally and physically, what their thoughts were at the time, and how they behaved. 2. Patients will identify how their behavior at that time worked for them, as well as how it worked against them. 3. Patients will explore possible new behaviors to use in future anger situations. 4. Patients will learn that anger itself is normal and cannot be eliminated, and that healthier reactions can assist with resolving conflict rather than worsening situations.  Summary of Patient Progress:  The patient shared that his most recent time of anger was this morning when for the third time in a row a breakfast tray was not brought to him and said he talked about it to the RN and stopped feeling irritated.  He said that to him anger is not "worth it" so he does not stuff it down, but just walks away.  Therapeutic Modalities:   Cognitive Behavioral Therapy  Lynnell Chad

## 2018-10-08 NOTE — Progress Notes (Signed)
[facility] MD Progress Note  10/08/2018 11:24 AM Edwin Martinez  MRN:  [id number] Subjective: Patient describes partially improved mood although reports persistent depression and states he had some passive suicidal thoughts yesterday, not today.  He describes improvement of hallucinations and currently not experiencing any active hallucinatory experiences.  Denies medication side effects.  Currently denies suicidal ideations and contracts for safety.  Objective: I have reviewed chart notes and have met with patient 32 year old male, presented to the hospital reporting worsening depression, auditory hallucinations which she describes as insulting, demeaning.  Suicidal ideations and recent overdose on Excedrin. He attributes worsening depression to significant stressors which include homelessness, social isolation, lack of income, being off psychiatric medications for several weeks to months.  He also endorses cocaine abuse several times a week.  Patient is HIV positive, reports he had been off his antiretroviral medication up to 2 or 3 weeks ago when he was restarted on it. Reports partially improved mood although describes residual depression and as above states yesterday he had some persistent passive SI.  Today denies SI, contracts for safety on unit, presents future oriented. Auditory hallucinations have improved, decreased and at this time does not appear internally preoccupied.  No delusions are expressed. He has been visible on unit, going to groups, presents pleasant on approach. He is tolerating medications well and currently denies medication side effects. She reports chronic intermittent cough which she describes as dry cough and not associated with dyspnea or other symptoms. Recent (12/21) chest x-ray negative.  Patient is a smoker and states he suspect that this is related to his cough.  Principal Problem: Cocaine abuse with cocaine-induced mood disorder (HCC) Diagnosis: Principal  Problem:   Cocaine abuse with cocaine-induced mood disorder (HCC) Active Problems:   Schizoaffective disorder (HCC)   MDD (major depressive disorder), severe ([place])  Total Time spent with patient: 15 minutes  Past Psychiatric History: See admission H&P  Past Medical History:  Past Medical History:  Diagnosis Date  . ADD (attention deficit disorder)   . Anxiety   . Asthma   . Bipolar 1 disorder ([place])   . Depression   . HIV (human immunodeficiency virus infection) ([place]) dx'd 2008  . Hypertension   . Insomnia   . Intentional drug overdose ([place])    Archie Endo 02/25/2018  . Polysubstance abuse ([place])    Archie Endo 02/25/2018  . Schizophrenia ([place])   . Seizures ([place])    "used to have little black-out szs where I'd drop out for 2-3 min then come back; nothing in the last 2-3-4years" (02/25/2018)  . Shingles     Past Surgical History:  Procedure Laterality Date  . DENTAL SURGERY     "had my eye teeth pulled down"   Family History:  Family History  Problem Relation Age of Onset  . Huntington's disease Father   . Heart disease Mother   . Suicidality Maternal Uncle   . Suicidality Maternal Grandmother    Family Psychiatric  History: See admission H&P Social History:  Social History   Substance and Sexual Activity  Alcohol Use Yes  . Alcohol/week: 4.0 standard drinks  . Types: 4 Cans of beer per week   Comment: occasionally, "socially"     Social History   Substance and Sexual Activity  Drug Use Yes  . Types: Marijuana, Cocaine   Comment: 10/06/2018 "self medicating occassionally"    Social History   Socioeconomic History  . Marital status: Single    Spouse name: Not on file  .  Number of children: Not on file  . Years of education: Not on file  . Highest education level: Not on file  Occupational History  . Occupation: disability pending  Social Needs  . Financial resource strain: Not on file  . Food insecurity:    Worry: Not on file    Inability: Not on file  .  Transportation needs:    Medical: Not on file    Non-medical: Not on file  Tobacco Use  . Smoking status: Current Every Day Smoker    Packs/day: 0.50    Years: 20.00    Pack years: 10.00    Types: Cigarettes    Start date: 09/29/1991  . Smokeless tobacco: Never Used  Substance and Sexual Activity  . Alcohol use: Yes    Alcohol/week: 4.0 standard drinks    Types: 4 Cans of beer per week    Comment: occasionally, "socially"  . Drug use: Yes    Types: Marijuana, Cocaine    Comment: 10/06/2018 "self medicating occassionally"  . Sexual activity: Yes    Birth control/protection: Condom  Lifestyle  . Physical activity:    Days per week: Not on file    Minutes per session: Not on file  . Stress: Not on file  Relationships  . Social connections:    Talks on phone: Not on file    Gets together: Not on file    Attends religious service: Not on file    Active member of club or organization: Not on file    Attends meetings of clubs or organizations: Not on file    Relationship status: Not on file  Other Topics Concern  . Not on file  Social History Narrative   ** Merged History Encounter **       ** Merged History Encounter **       Additional Social History:   Sleep: Good  Appetite:  Good  Current Medications: Current Facility-Administered Medications  Medication Dose Route Frequency Provider Last Rate Last Dose  . bictegravir-emtricitabine-tenofovir AF (BIKTARVY) 50-200-25 MG per tablet 1 tablet  1 tablet Oral Daily , Myer Peer, MD   1 tablet at 10/08/18 0839  . busPIRone (BUSPAR) tablet 5 mg  5 mg Oral BID , Myer Peer, MD   5 mg at 10/08/18 0839  . citalopram (CELEXA) tablet 10 mg  10 mg Oral Daily , Myer Peer, MD   10 mg at 10/08/18 0839  . gabapentin (NEURONTIN) capsule 100 mg  100 mg Oral TID , Myer Peer, MD   100 mg at 10/08/18 0839  . guaiFENesin (MUCINEX) 12 hr tablet 1,200 mg  1,200 mg Oral BID PRN Lindon Romp A, NP   1,200 mg at 10/07/18 2133   . nicotine (NICODERM CQ - dosed in mg/24 hours) patch 21 mg  21 mg Transdermal Daily , Myer Peer, MD   21 mg at 10/06/18 1423  . risperiDONE (RISPERDAL) tablet 1 mg  1 mg Oral QHS , Myer Peer, MD   1 mg at 10/07/18 2107  . traZODone (DESYREL) tablet 50 mg  50 mg Oral QHS PRN , Myer Peer, MD   50 mg at 10/07/18 2112    Lab Results:  No results found for this or any previous visit (from the past 48 hour(s)).  Blood Alcohol level:  Lab Results  Component Value Date   [facility] <10 10/05/2018   ETH <10 02/54/2706    Metabolic Disorder Labs: Lab Results  Component Value Date   HGBA1C 5.6 12/24/2016  MPG 114 12/24/2016   MPG 111 05/04/2016   Lab Results  Component Value Date   PROLACTIN 28.8 (H) 12/24/2016   PROLACTIN 32.3 (H) 08/19/2016   Lab Results  Component Value Date   CHOL 147 08/31/2018   TRIG 189 (H) 08/31/2018   HDL 45 08/31/2018   CHOLHDL 3.3 08/31/2018   VLDL 26 12/24/2016   LDLCALC 74 08/31/2018   LDLCALC 72 04/06/2018    Physical Findings: AIMS: Facial and Oral Movements Muscles of Facial Expression: None, normal Lips and Perioral Area: None, normal Jaw: None, normal Tongue: None, normal,Extremity Movements Upper (arms, wrists, hands, fingers): None, normal Lower (legs, knees, ankles, toes): None, normal, Trunk Movements Neck, shoulders, hips: None, normal, Overall Severity Severity of abnormal movements (highest score from questions above): None, normal Incapacitation due to abnormal movements: None, normal Patient's awareness of abnormal movements (rate only patient's report): No Awareness, Dental Status Current problems with teeth and/or dentures?: No Does patient usually wear dentures?: No  CIWA:  CIWA-Ar Total: 0 COWS:  COWS Total Score: 1  Musculoskeletal: Strength & Muscle Tone: within normal limits Gait & Station: normal Patient leans: N/A  Psychiatric Specialty Exam: Physical Exam  Nursing note and vitals  reviewed. Constitutional: He is oriented to person, place, and time. He appears well-developed and well-nourished.  Cardiovascular: Normal rate.  Respiratory: Effort normal.  Neurological: He is alert and oriented to person, place, and time.    Review of Systems  Constitutional: Negative.   Respiratory: Negative.   Cardiovascular: Negative.   Gastrointestinal: Negative.   Neurological: Negative.   Psychiatric/Behavioral: Positive for depression, hallucinations (intermittent AH last heard yesterday) and substance abuse (hx cocaine). Negative for memory loss and suicidal ideas. The patient is not nervous/anxious and does not have insomnia.   No headache, no chest pain, no shortness of breath, intermittent dry cough, no fever, no chills, no diaphoresis.  Blood pressure (!) 118/94, pulse 90, temperature 98.7 F (37.1 C), temperature source Oral, resp. rate 16, height _0  (1.702 m), weight 68.9 kg, SpO2 99 %.Body mass index is 23.81 kg/m.  General Appearance: Fairly Groomed  Eye Contact:  Good  Speech:  Normal Rate  Volume:  Normal  Mood:  Describes gradually improving mood, feeling better than on admission  Affect:  Vaguely constricted, does smile briefly/appropriately at times  Thought Process:  Linear and Descriptions of Associations: Intact  Orientation:  Full (Time, Place, and Person)  Thought Content:  Reports decreased auditory hallucinations and currently denies active hallucinations.  Does not appear internally preoccupied.  No delusions expressed  Suicidal Thoughts:  No-at this time denies suicidal plan or intention, denies self-injurious ideations and contracts for safety on unit  Homicidal Thoughts:  No  Memory:  Recent and remote grossly intact  Judgement: Improving  Insight:  Fair/improving  Psychomotor Activity:  Normal  Concentration:  Concentration: Good  Recall:  Good  Fund of Knowledge:  Good  Language:  Good  Akathisia:  No  Handed:  Right  AIMS (if indicated):      Assets:  Communication Skills Desire for Improvement Resilience  ADL's:  Intact  Cognition:  WNL  Sleep:  Number of Hours: 6.5   Assessment-  33 year old male, presented to the hospital reporting worsening depression, auditory hallucinations which she describes as insulting, demeaning.  Suicidal ideations and recent overdose on Excedrin. He attributes worsening depression to significant stressors which include homelessness, social isolation, lack of income, being off psychiatric medications for several weeks to months.  He also  endorses cocaine abuse several times a week.  Patient is HIV positive, reports he had been off his antiretroviral medication up to 2 or 3 weeks ago when he was restarted on it. At present patient is describing partially improved mood, decreased hallucinations, no suicidal ideations today, improving sleep and appetite.  Denies medication side effects.  Treatment Plan Summary: Daily contact with patient to assess and evaluate symptoms and progress in treatment and Medication management  Treatment plan reviewed as below today 1/11 Encourage group and milieu participation to work on coping skills and symptom reduction Encourage efforts to work on sobriety and relapse prevention, and continue to review likely benefits of smoking cessation on his overall health.  Continue Celexa 10 mg daily for depression and anxiety Continue Buspar 5 mg  BID for anxiety Continue Risperdal 1 mg  QHS for psychotic symptoms Continue Trazodone 50 mg  QHS PRN insomnia Continue Biktarvy  daily for HIV management Continue Neurontin 100 mg 3 times daily for anxiety Treatment team working on disposition planning options   Jenne Campus, MD 10/08/2018, 11:24 AM   Patient ID: Danne Harbor, male   DOB: 01/25/1986, 33 y.o.   MRN: [id number]

## 2018-10-08 NOTE — [facility] Group Notes (Signed)
Adult Psychoeducational Group Note  Date:  10/08/2018 Time:  9:11 PM  Group Topic/Focus:  Wrap-Up Group:   The focus of this group is to help patients review their daily goal of treatment and discuss progress on daily workbooks.  Participation Level:  Did Not Attend  Participation Quality:  did not attend  Affect:  did not attend  Cognitive:  did not attend  Insight: None  Engagement in Group:  Supportive  Modes of Intervention:  did not attend  Additional Comments:  Pt was not feeling well and he did not attend group  Edwin Martinez A 10/08/2018, 9:11 PM

## 2018-10-08 NOTE — Progress Notes (Signed)
D: Pt was in bed in his room upon initial approach.  Pt presents with depressed affect and mood.  He reports his day was "not too good.  I'm not feeling well."  His goal is to "feel better and go to rehab and get some housing."  Pt denies SI/HI, denies hallucinations, reports generalized pain of 8/10.      A: Met with pt 1:1.  On-site provider notified that pt's flu test result was positive and pt was placed on droplet precautions.  Pt was encouraged to stay in his room to prevent spread of infection and he verbalized understanding.  PO fluids encouraged and provided.  PRN medication for pain was ordered and administered.  Actively listened to pt and offered support and encouragement.  Medication administered per order.  PRN medication administered for sleep.  Q15 minute safety checks maintained.  R: Pt is safe on the unit.  Pt is compliant with medications.  Pt verbally contracts for safety.  Will continue to monitor and assess.

## 2018-10-08 NOTE — Plan of Care (Signed)
D: Patient observed in the milieu and presents depressed, but calm and cooperative. He says that once he started on medication for depression here, he no longer has suicidal thoughts. He states that "day by day is better." He slept fair last night and received medication that was helpful. His appetite is good, energy normal and concentration good. He rates his depression and hopelessness 5/10, and anxiety 7/10. He denies withdrawal symptoms or physical complaints. He hopes to pursue rehab and find a place to stay after discharge. Patient denies SI/HI/AVH.  A: Patient checked q15 min, and checks reviewed. Reviewed medication changes with patient and educated on side effects. Educated patient on importance of attending group therapy sessions and educated on several coping skills. Encouarged participation in milieu through recreation therapy and attending meals with peers. Support and encouragement provided. Fluids offered. R: Patient receptive to education on medications, and is medication compliant. Patient contracts for safety on the unit.

## 2018-10-09 MED ORDER — OSELTAMIVIR PHOSPHATE 75 MG PO CAPS
75.0000 mg | ORAL_CAPSULE | Freq: Two times a day (BID) | ORAL | Status: DC
  Administered 2018-10-09 – 2018-10-12 (×6): 75 mg via ORAL
  Filled 2018-10-09: qty 1
  Filled 2018-10-09: qty 6
  Filled 2018-10-09 (×7): qty 1

## 2018-10-09 MED ORDER — OSELTAMIVIR PHOSPHATE 30 MG PO CAPS
90.0000 mg | ORAL_CAPSULE | Freq: Once | ORAL | Status: AC
  Administered 2018-10-09: 90 mg via ORAL
  Filled 2018-10-09: qty 3

## 2018-10-09 MED ORDER — OSELTAMIVIR PHOSPHATE 75 MG PO CAPS
75.0000 mg | ORAL_CAPSULE | Freq: Two times a day (BID) | ORAL | Status: DC
  Filled 2018-10-09 (×3): qty 1

## 2018-10-09 NOTE — Progress Notes (Addendum)
[facility] MD Progress Note  10/09/2018 11:07 AM Edwin Martinez  MRN:  [id number] Subjective: Patient reports malaise, myalgias, nasal congestion, feeling feverish.  States that by feeling physically ill he is also feeling more tired and endorses lingering depression.  Denies any suicidal ideations, contracts for safety on unit.  Does not endorse medication side effects.   Objective: I have reviewed chart notes and have met with patient 33 year old male, presented to the hospital reporting worsening depression, auditory hallucinations which she describes as insulting, demeaning.  Suicidal ideations and recent overdose on Excedrin. He attributes worsening depression to significant stressors which include homelessness, social isolation, lack of income, being off psychiatric medications for several weeks to months.  He also endorses cocaine abuse several times a week.  Patient is HIV positive, reports he had been off his antiretroviral medication up to 2 or 3 weeks ago when he was restarted on it. Patient states he started feeling physically ill yesterday evening, with symptoms of upper respiratory infection, fever.  Influenza testing was done yesterday-result positive for influenza A. Denies medication side effects.  We have reviewed medical history-denies allergy to Tamiflu.  Currently tolerating medications well.  Mood depressed, affect constricted but reactive, smiles at times appropriately.  Denies suicidal ideations at this time.  Does not appear internally preoccupied.  Principal Problem: Cocaine abuse with cocaine-induced mood disorder (HCC) Diagnosis: Principal Problem:   Cocaine abuse with cocaine-induced mood disorder (HCC) Active Problems:   Schizoaffective disorder (HCC)   MDD (major depressive disorder), severe ([place])  Total Time spent with patient: 20 minutes  Past Psychiatric History: See admission H&P  Past Medical History:  Past Medical History:  Diagnosis Date  . ADD  (attention deficit disorder)   . Anxiety   . Asthma   . Bipolar 1 disorder ([place])   . Depression   . HIV (human immunodeficiency virus infection) ([place]) dx'd 2008  . Hypertension   . Insomnia   . Intentional drug overdose ([place])    Archie Endo 02/25/2018  . Polysubstance abuse ([place])    Archie Endo 02/25/2018  . Schizophrenia ([place])   . Seizures ([place])    "used to have little black-out szs where I'd drop out for 2-3 min then come back; nothing in the last 2-3-4years" (02/25/2018)  . Shingles     Past Surgical History:  Procedure Laterality Date  . DENTAL SURGERY     "had my eye teeth pulled down"   Family History:  Family History  Problem Relation Age of Onset  . Huntington's disease Father   . Heart disease Mother   . Suicidality Maternal Uncle   . Suicidality Maternal Grandmother    Family Psychiatric  History: See admission H&P Social History:  Social History   Substance and Sexual Activity  Alcohol Use Yes  . Alcohol/week: 4.0 standard drinks  . Types: 4 Cans of beer per week   Comment: occasionally, "socially"     Social History   Substance and Sexual Activity  Drug Use Yes  . Types: Marijuana, Cocaine   Comment: 10/06/2018 "self medicating occassionally"    Social History   Socioeconomic History  . Marital status: Single    Spouse name: Not on file  . Number of children: Not on file  . Years of education: Not on file  . Highest education level: Not on file  Occupational History  . Occupation: disability pending  Social Needs  . Financial resource strain: Not on file  . Food insecurity:    Worry: Not on  file    Inability: Not on file  . Transportation needs:    Medical: Not on file    Non-medical: Not on file  Tobacco Use  . Smoking status: Current Every Day Smoker    Packs/day: 0.50    Years: 20.00    Pack years: 10.00    Types: Cigarettes    Start date: 09/29/1991  . Smokeless tobacco: Never Used  Substance and Sexual Activity  . Alcohol use: Yes     Alcohol/week: 4.0 standard drinks    Types: 4 Cans of beer per week    Comment: occasionally, "socially"  . Drug use: Yes    Types: Marijuana, Cocaine    Comment: 10/06/2018 "self medicating occassionally"  . Sexual activity: Yes    Birth control/protection: Condom  Lifestyle  . Physical activity:    Days per week: Not on file    Minutes per session: Not on file  . Stress: Not on file  Relationships  . Social connections:    Talks on phone: Not on file    Gets together: Not on file    Attends religious service: Not on file    Active member of club or organization: Not on file    Attends meetings of clubs or organizations: Not on file    Relationship status: Not on file  Other Topics Concern  . Not on file  Social History Narrative   ** Merged History Encounter **       ** Merged History Encounter **       Additional Social History:   Sleep: Good  Appetite:  Fair  Current Medications: Current Facility-Administered Medications  Medication Dose Route Frequency Provider Last Rate Last Dose  . benzonatate (TESSALON) capsule 100 mg  100 mg Oral TID PRN Derrill Center, NP   100 mg at 10/08/18 1856  . bictegravir-emtricitabine-tenofovir AF (BIKTARVY) 50-200-25 MG per tablet 1 tablet  1 tablet Oral Daily Zenovia Justman, Myer Peer, MD   1 tablet at 10/09/18 0818  . busPIRone (BUSPAR) tablet 5 mg  5 mg Oral BID Therisa Mennella, Myer Peer, MD   5 mg at 10/09/18 0818  . citalopram (CELEXA) tablet 10 mg  10 mg Oral Daily Bharath Bernstein, Myer Peer, MD   10 mg at 10/09/18 0818  . gabapentin (NEURONTIN) capsule 100 mg  100 mg Oral TID Darlen Gledhill, Myer Peer, MD   100 mg at 10/09/18 0818  . ibuprofen (ADVIL,MOTRIN) tablet 600 mg  600 mg Oral Q6H PRN Lindon Romp A, NP   600 mg at 10/09/18 0914  . loratadine (CLARITIN) tablet 10 mg  10 mg Oral Daily Derrill Center, NP   10 mg at 10/09/18 0818  . nicotine (NICODERM CQ - dosed in mg/24 hours) patch 21 mg  21 mg Transdermal Daily Fiora Weill, Myer Peer, MD   21 mg at 10/06/18  1423  . oseltamivir (TAMIFLU) capsule 75 mg  75 mg Oral BID Wheeler Incorvaia A, MD      . risperiDONE (RISPERDAL) tablet 1 mg  1 mg Oral QHS Eligha Kmetz, Myer Peer, MD   1 mg at 10/08/18 2114  . traZODone (DESYREL) tablet 50 mg  50 mg Oral QHS PRN Domonique Brouillard, Myer Peer, MD   50 mg at 10/08/18 2114    Lab Results:  Results for orders placed or performed during the hospital encounter of 10/06/18 (from the past 48 hour(s))  Influenza panel by PCR (type A & B)     Status: Abnormal   Collection Time: 10/08/18  7:00 PM  Result Value Ref Range   Influenza A By PCR POSITIVE (A) NEGATIVE   Influenza B By PCR NEGATIVE NEGATIVE    Comment: (NOTE) The Xpert Xpress Flu assay is intended as an aid in the diagnosis of  influenza and should not be used as a sole basis for treatment.  This  assay is FDA approved for nasopharyngeal swab specimens only. Nasal  washings and aspirates are unacceptable for Xpert Xpress Flu testing. Performed at [facility], [place] [address], [place], [place] [address]     Blood Alcohol level:  Lab Results  Component Value Date   ETH <10 10/05/2018   ETH <10 19/37/9024    Metabolic Disorder Labs: Lab Results  Component Value Date   HGBA1C 5.6 12/24/2016   MPG 114 12/24/2016   MPG 111 05/04/2016   Lab Results  Component Value Date   PROLACTIN 28.8 (H) 12/24/2016   PROLACTIN 32.3 (H) 08/19/2016   Lab Results  Component Value Date   CHOL 147 08/31/2018   TRIG 189 (H) 08/31/2018   HDL 45 08/31/2018   CHOLHDL 3.3 08/31/2018   VLDL 26 12/24/2016   LDLCALC 74 08/31/2018   LDLCALC 72 04/06/2018    Physical Findings: AIMS: Facial and Oral Movements Muscles of Facial Expression: None, normal Lips and Perioral Area: None, normal Jaw: None, normal Tongue: None, normal,Extremity Movements Upper (arms, wrists, hands, fingers): None, normal Lower (legs, knees, ankles, toes): None, normal, Trunk Movements Neck, shoulders, hips: None, normal, Overall  Severity Severity of abnormal movements (highest score from questions above): None, normal Incapacitation due to abnormal movements: None, normal Patient's awareness of abnormal movements (rate only patient's report): No Awareness, Dental Status Current problems with teeth and/or dentures?: No Does patient usually wear dentures?: No  CIWA:  CIWA-Ar Total: 0 COWS:  COWS Total Score: 1  Musculoskeletal: Strength & Muscle Tone: within normal limits Gait & Station: normal Patient leans: N/A  Psychiatric Specialty Exam: Physical Exam  Nursing note and vitals reviewed. Constitutional: He is oriented to person, place, and time. He appears well-developed and well-nourished.  Cardiovascular: Normal rate.  Respiratory: Effort normal.  Neurological: He is alert and oriented to person, place, and time.    Review of Systems  Constitutional: Negative.   Respiratory: Negative.   Cardiovascular: Negative.   Gastrointestinal: Negative.   Neurological: Negative.   Psychiatric/Behavioral: Positive for depression, hallucinations (intermittent AH last heard yesterday) and substance abuse (hx cocaine). Negative for memory loss and suicidal ideas. The patient is not nervous/anxious and does not have insomnia.   Endorses malaise, myalgias, mild headache, nasal congestion.  No shortness of breath at room air  Blood pressure (!) 142/84, pulse 87, temperature (!) 100.9 F (38.3 C), temperature source Oral, resp. rate 16, height 5' 7"  (1.702 m), weight 68.9 kg, SpO2 99 %.Body mass index is 23.81 kg/m.  General Appearance: Fairly Groomed  Eye Contact:  Good  Speech:  Normal Rate  Volume:  Normal  Mood:  Reports lingering depression  Affect:  Remains constricted although smiles briefly at times  Thought Process:  Linear and Descriptions of Associations: Intact  Orientation:  Full (Time, Place, and Person)  Thought Content:  Today does not describe auditory hallucinations and does not appear internally  preoccupied  Suicidal Thoughts:  No-at this time denies suicidal plan or intention, denies self-injurious ideations and contracts for safety on unit  Homicidal Thoughts:  No  Memory:  Recent and remote grossly intact  Judgement: Improving  Insight:  Fair/improving  Psychomotor Activity:  Decreased  Concentration:  Concentration: Good  Recall:  Good  Fund of Knowledge:  Good  Language:  Good  Akathisia:  No  Handed:  Right  AIMS (if indicated):     Assets:  Communication Skills Desire for Improvement Resilience  ADL's:  Intact  Cognition:  WNL  Sleep:  Number of Hours: 6.5   Assessment-  33 year old male, presented to the hospital reporting worsening depression, auditory hallucinations which she describes as insulting, demeaning.  Suicidal ideations and recent overdose on Excedrin. He attributes worsening depression to significant stressors which include homelessness, social isolation, lack of income, being off psychiatric medications for several weeks to months.  He also endorses cocaine abuse several times a week.  Patient is HIV positive, reports he had been off his antiretroviral medication up to 2 or 3 weeks ago when he was restarted on it.  Patient has developed symptoms of upper respiratory infection associated with generalized malaise and fever.  Influenza testing positive for influenza A.  Symptoms started yesterday evening.  Of note he states he had received flu shot on admission.  Denies medication side effects.  Remains vaguely depressed, dysphoric but not suicidal and at this time does not endorse any active psychotic symptoms.  I have discussed case with infectious disease consultant-will initiate Tamiflu x5 days, patient on droplet isolation.  Courage p.o. fluids.  Manage discomfort and fever with ibuprofen as needed-he states he prefers not to use acetaminophen due to history of hep C.   Treatment Plan Summary: Daily contact with patient to assess and evaluate symptoms and  progress in treatment and Medication management  Treatment plan reviewed as below today 1/12 Encourage group and milieu participation to work on coping skills and symptom reduction Encourage efforts to work on sobriety and relapse prevention, and continue to review likely benefits of smoking cessation on his overall health.  Continue Celexa 10 mg daily for depression and anxiety Continue Buspar 5 mg  BID for anxiety Continue Risperdal 1 mg  QHS for psychotic symptoms Continue Trazodone 50 mg  QHS PRN insomnia Continue Biktarvy  daily for HIV management Continue Neurontin 100 mg 3 times daily for anxiety Start Tamiflu 75 mg twice daily x5 days for influenza Patient currently on droplet precautions.  Treatment team working on disposition [place], MD 10/09/2018, 11:07 AM   Patient ID: Danne Harbor, male   DOB: 02-24-1986, 33 y.o.   MRN: [id number]

## 2018-10-09 NOTE — Progress Notes (Signed)
Pt presents with a flat affect and a depressed mood. Pt denies SI/HI. Pt reported fair sleep last night and a fair appetite this morning. Fluids encouraged and provided to the pt around the clock. V/s assessed. Pt noted to be febrile this am, motrin was administered to the pt and the MD was notified. Will continue to assess Temp. Meals provided to the pt in his room. No n/v verbalized by pt. Pt compliant with taking meds and no side effects to meds verbalized by pt.   Medications reviewed with pt. Medications administered as ordered per MD. Verbal support provided. Pt encouraged to attend groups. Tamiflu started per MD for the Flu. Education provided to the pt. V/s assessed. 15 minute checks performed for safety. Snack provided to pt. Books, puzzles and magazines offered to pt.  Pt compliant with tx plan.

## 2018-10-09 NOTE — [facility] Group Notes (Signed)
[facility] Group Notes: (Clinical Social Work)   10/09/2018      Type of Therapy:  Group Therapy   Participation Level:  Did Not Attend despite MHT prompting   Kaydence Baba Grossman-Orr, LCSW 10/09/2018, 12:37 PM         

## 2018-10-10 NOTE — Progress Notes (Signed)
D: Pt denies SI/HI. Pt is pleasant and cooperative. Pt states he is having auditory hallucinations. Patient has been in his room this shift do to being on droplet precautions for flu.  A: Pt was offered support and encouragement. Pt was given scheduled medications.  Q 15 minute checks were done for safety.  R: Pt is taking medication. Pt has no complaints.Pt receptive to treatment and safety maintained on unit.

## 2018-10-10 NOTE — Progress Notes (Signed)
Pt asleep in bed. Pt in no sign of distress. Will continue to monitor.

## 2018-10-10 NOTE — Progress Notes (Signed)
Recreation Therapy Notes  Date: 1.13.20 Time: 0930 Location: [address] Dayroom  Group Topic: Stress Management  Goal Area(s) Addresses:  Patient will identify stress management techniques. Patient will identify benefits of using stress management techniques post d/c.  Intervention: Stress Management  Activity :  Meditation.  LRT introduced the stress management technique of meditation.  LRT played a meditation on being resilient in the face of adversity.  Patients were to follow along as meditation played to engage in activity.  Education:  Stress Management, Discharge Planning.   Education Outcome: Acknowledges Education  Clinical Observations/Feedback: Pt did not attend group.    Larell Baney, LRT/CTRS         Elysse Polidore A 10/10/2018 11:59 AM 

## 2018-10-10 NOTE — [facility] Group Notes (Signed)
LCSW Group Therapy Note 10/10/2018 11:06 AM  Type of Therapy and Topic: Group Therapy: Overcoming Obstacles  Participation Level: Did Not Attend  Description of Group:  In this group patients will be encouraged to explore what they see as obstacles to their own wellness and recovery. They will be guided to discuss their thoughts, feelings, and behaviors related to these obstacles. The group will process together ways to cope with barriers, with attention given to specific choices patients can make. Each patient will be challenged to identify changes they are motivated to make in order to overcome their obstacles. This group will be process-oriented, with patients participating in exploration of their own experiences as well as giving and receiving support and challenge from other group members.  Therapeutic Goals: 1. Patient will identify personal and current obstacles as they relate to admission. 2. Patient will identify barriers that currently interfere with their wellness or overcoming obstacles.  3. Patient will identify feelings, thought process and behaviors related to these barriers. 4. Patient will identify two changes they are willing to make to overcome these obstacles:   Summary of Patient Progress  Invited, chose not to attend.    Therapeutic Modalities:  Cognitive Behavioral Therapy Solution Focused Therapy Motivational Interviewing Relapse Prevention Therapy   Alcario DroughtJolan Chanson Teems LCSWA Clinical Social Worker

## 2018-10-10 NOTE — Plan of Care (Signed)
Progress note  D: pt found in bed; compliant with medication administration. Pt denies any physical pain or symptoms, rating his pain a 0/10. Pt is on precaution for influenza. Pt is bright and assertive upon approach though. Pt denies any si/hi/ah/vh and verbally agrees to approach staff if these become apparent or before harming himself or others while at [facility].  A: pt provided support and encouragement. Pt given medication per protocol and standing orders. Q71m safety checks implemented. R: pt safe on the unit. Will continue to monitor.   Pt progressing in the following metrics  Problem: Education: Goal: Knowledge of [place] Education information/materials will improve Outcome: Progressing   Problem: Coping: Goal: Ability to verbalize frustrations and anger appropriately will improve Outcome: Progressing   Problem: Health Behavior/Discharge Planning: Goal: Identification of resources available to assist in meeting health care needs will improve Outcome: Progressing   Problem: Physical Regulation: Goal: Ability to maintain clinical measurements within normal limits will improve Outcome: Progressing

## 2018-10-10 NOTE — Progress Notes (Addendum)
[facility] MD Progress Note  10/10/2018 1:28 PM Edwin Martinez  MRN:  [id number] Subjective: Patient reports he is feeling somewhat better than yesterday.  Still describes some myalgias and malaise but less than yesterday.  He also describes less congestion. Reports mood has improved partially since admission.  States he experienced some hallucinations yesterday evening, but that overall hallucinatory experiences have decreased in frequency. Currently tolerating medications well.  Objective: I have reviewed chart notes and have met with patient 33 year old male, presented to the hospital reporting worsening depression, auditory hallucinations which she describes as insulting, demeaning.  Suicidal ideations and recent overdose on Excedrin. Today patient presenting with improving physical symptoms-myalgias and malaise persist but improved compared to yesterday.  He does present better, sitting in bed, more interactive today.  Vitals are stable, but he remains febrile at 102.5  No disruptive or agitated behaviors on unit, behavior in good control, pleasant on approach. Currently on droplet precautions due to influenza.  Tolerating Tamiflu course well thus far, denies side effects. Reports decreased but resolved hallucinations, at this time not internally preoccupied, no thought disorder. Tolerating current medication regimen well. Of note, no cogwheeling or rigidity noted or reported .  No mental status changes, presents alert, attentive and oriented x3.  Principal Problem: Cocaine abuse with cocaine-induced mood disorder (HCC) Diagnosis: Principal Problem:   Cocaine abuse with cocaine-induced mood disorder (HCC) Active Problems:   Schizoaffective disorder (HCC)   MDD (major depressive disorder), severe ([place])  Total Time spent with patient: 15 minutes  Past Psychiatric History: See admission H&P  Past Medical History:  Past Medical History:  Diagnosis Date  . ADD (attention deficit  disorder)   . Anxiety   . Asthma   . Bipolar 1 disorder ([place])   . Depression   . HIV (human immunodeficiency virus infection) ([place]) dx'd 2008  . Hypertension   . Insomnia   . Intentional drug overdose ([place])    Archie Endo 02/25/2018  . Polysubstance abuse ([place])    Archie Endo 02/25/2018  . Schizophrenia ([place])   . Seizures ([place])    "used to have little black-out szs where I'd drop out for 2-3 min then come back; nothing in the last 2-3-4years" (02/25/2018)  . Shingles     Past Surgical History:  Procedure Laterality Date  . DENTAL SURGERY     "had my eye teeth pulled down"   Family History:  Family History  Problem Relation Age of Onset  . Huntington's disease Father   . Heart disease Mother   . Suicidality Maternal Uncle   . Suicidality Maternal Grandmother    Family Psychiatric  History: See admission H&P Social History:  Social History   Substance and Sexual Activity  Alcohol Use Yes  . Alcohol/week: 4.0 standard drinks  . Types: 4 Cans of beer per week   Comment: occasionally, "socially"     Social History   Substance and Sexual Activity  Drug Use Yes  . Types: Marijuana, Cocaine   Comment: 10/06/2018 "self medicating occassionally"    Social History   Socioeconomic History  . Marital status: Single    Spouse name: Not on file  . Number of children: Not on file  . Years of education: Not on file  . Highest education level: Not on file  Occupational History  . Occupation: disability pending  Social Needs  . Financial resource strain: Not on file  . Food insecurity:    Worry: Not on file    Inability: Not on file  .  Transportation needs:    Medical: Not on file    Non-medical: Not on file  Tobacco Use  . Smoking status: Current Every Day Smoker    Packs/day: 0.50    Years: 20.00    Pack years: 10.00    Types: Cigarettes    Start date: 09/29/1991  . Smokeless tobacco: Never Used  Substance and Sexual Activity  . Alcohol use: Yes    Alcohol/week: 4.0 standard  drinks    Types: 4 Cans of beer per week    Comment: occasionally, "socially"  . Drug use: Yes    Types: Marijuana, Cocaine    Comment: 10/06/2018 "self medicating occassionally"  . Sexual activity: Yes    Birth control/protection: Condom  Lifestyle  . Physical activity:    Days per week: Not on file    Minutes per session: Not on file  . Stress: Not on file  Relationships  . Social connections:    Talks on phone: Not on file    Gets together: Not on file    Attends religious service: Not on file    Active member of club or organization: Not on file    Attends meetings of clubs or organizations: Not on file    Relationship status: Not on file  Other Topics Concern  . Not on file  Social History Narrative   ** Merged History Encounter **       ** Merged History Encounter **       Additional Social History:   Sleep: Good  Appetite:  Fair  Current Medications: Current Facility-Administered Medications  Medication Dose Route Frequency Provider Last Rate Last Dose  . benzonatate (TESSALON) capsule 100 mg  100 mg Oral TID PRN Derrill Center, NP   100 mg at 10/08/18 1856  . bictegravir-emtricitabine-tenofovir AF (BIKTARVY) 50-200-25 MG per tablet 1 tablet  1 tablet Oral Daily Cobos, Myer Peer, MD   1 tablet at 10/10/18 0809  . busPIRone (BUSPAR) tablet 5 mg  5 mg Oral BID Cobos, Myer Peer, MD   5 mg at 10/10/18 0810  . citalopram (CELEXA) tablet 10 mg  10 mg Oral Daily Cobos, Myer Peer, MD   10 mg at 10/10/18 0809  . gabapentin (NEURONTIN) capsule 100 mg  100 mg Oral TID Cobos, Myer Peer, MD   100 mg at 10/10/18 1256  . ibuprofen (ADVIL,MOTRIN) tablet 600 mg  600 mg Oral Q6H PRN Lindon Romp A, NP   600 mg at 10/09/18 1625  . loratadine (CLARITIN) tablet 10 mg  10 mg Oral Daily Derrill Center, NP   10 mg at 10/10/18 0810  . nicotine (NICODERM CQ - dosed in mg/24 hours) patch 21 mg  21 mg Transdermal Daily Cobos, Myer Peer, MD   Stopped at 10/10/18 0815  . oseltamivir  (TAMIFLU) capsule 75 mg  75 mg Oral BID Cobos, Myer Peer, MD   75 mg at 10/10/18 0809  . risperiDONE (RISPERDAL) tablet 1 mg  1 mg Oral QHS Cobos, Myer Peer, MD   1 mg at 10/09/18 2228  . traZODone (DESYREL) tablet 50 mg  50 mg Oral QHS PRN Cobos, Myer Peer, MD   50 mg at 10/09/18 2228    Lab Results:  Results for orders placed or performed during the hospital encounter of 10/06/18 (from the past 48 hour(s))  Influenza panel by PCR (type A & B)     Status: Abnormal   Collection Time: 10/08/18  7:00 PM  Result Value Ref Range  Influenza A By PCR POSITIVE (A) NEGATIVE   Influenza B By PCR NEGATIVE NEGATIVE    Comment: (NOTE) The Xpert Xpress Flu assay is intended as an aid in the diagnosis of  influenza and should not be used as a sole basis for treatment.  This  assay is FDA approved for nasopharyngeal swab specimens only. Nasal  washings and aspirates are unacceptable for Xpert Xpress Flu testing. Performed at [facility], [place] [address], [place], [place] [address]     Blood Alcohol level:  Lab Results  Component Value Date   ETH <10 10/05/2018   ETH <10 79/10/4095    Metabolic Disorder Labs: Lab Results  Component Value Date   HGBA1C 5.6 12/24/2016   MPG 114 12/24/2016   MPG 111 05/04/2016   Lab Results  Component Value Date   PROLACTIN 28.8 (H) 12/24/2016   PROLACTIN 32.3 (H) 08/19/2016   Lab Results  Component Value Date   CHOL 147 08/31/2018   TRIG 189 (H) 08/31/2018   HDL 45 08/31/2018   CHOLHDL 3.3 08/31/2018   VLDL 26 12/24/2016   LDLCALC 74 08/31/2018   LDLCALC 72 04/06/2018    Physical Findings: AIMS: Facial and Oral Movements Muscles of Facial Expression: None, normal Lips and Perioral Area: None, normal Jaw: None, normal Tongue: None, normal,Extremity Movements Upper (arms, wrists, hands, fingers): None, normal Lower (legs, knees, ankles, toes): None, normal, Trunk Movements Neck, shoulders, hips: None, normal, Overall  Severity Severity of abnormal movements (highest score from questions above): None, normal Incapacitation due to abnormal movements: None, normal Patient's awareness of abnormal movements (rate only patient's report): No Awareness, Dental Status Current problems with teeth and/or dentures?: No Does patient usually wear dentures?: No  CIWA:  CIWA-Ar Total: 0 COWS:  COWS Total Score: 1  Musculoskeletal: Strength & Muscle Tone: within normal limits Gait & Station: normal Patient leans: N/A  Psychiatric Specialty Exam: Physical Exam  Nursing note and vitals reviewed. Constitutional: He is oriented to person, place, and time. He appears well-developed and well-nourished.  Cardiovascular: Normal rate.  Respiratory: Effort normal.  Neurological: He is alert and oriented to person, place, and time.    Review of Systems  Constitutional: Negative.   Respiratory: Negative.   Cardiovascular: Negative.   Gastrointestinal: Negative.   Neurological: Negative.   Psychiatric/Behavioral: Positive for depression, hallucinations (intermittent AH last heard yesterday) and substance abuse (hx cocaine). Negative for memory loss and suicidal ideas. The patient is not nervous/anxious and does not have insomnia.   Endorses improving malaise, myalgias No shortness of breath at room air  Blood pressure (!) 142/84, pulse 95, temperature 99 F (37.2 C), temperature source Oral, resp. rate 16, height 5' 7"  (1.702 m), weight 68.9 kg, SpO2 100 %.Body mass index is 23.81 kg/m.  General Appearance: Fairly Groomed  Eye Contact:  Good  Speech:  Normal Rate  Volume:  Normal  Mood:  Reports feeling better today  Affect:  Affect presents more reactive today  Thought Process:  Linear and Descriptions of Associations: Intact  Orientation:  Full (Time, Place, and Person)  Thought Content:  Today does not describe auditory hallucinations and does not appear internally preoccupied  Suicidal Thoughts:  No-at this time  denies suicidal plan or intention, denies self-injurious ideations and contracts for safety on unit  Homicidal Thoughts:  No  Memory:  Recent and remote grossly intact  Judgement: Improving  Insight:  Fair/improving  Psychomotor Activity:  Decreased- no rigidity or cog wheeling at this time  Concentration:  Concentration: Good  Recall:  Good  Fund of Knowledge:  Good  Language:  Good  Akathisia:  No  Handed:  Right  AIMS (if indicated):     Assets:  Communication Skills Desire for Improvement Resilience  ADL's:  Intact  Cognition:  WNL  Sleep:  Number of Hours: 4.25   Assessment-  33 year old male, presented to the hospital reporting worsening depression, auditory hallucinations which she describes as insulting, demeaning.  Suicidal ideations and recent overdose on Excedrin. He attributes worsening depression to significant stressors which include homelessness, social isolation, lack of income, being off psychiatric medications for several weeks to months.  He also endorses cocaine abuse several times a week.  Patient is HIV positive, reports he had been off his antiretroviral medication up to 2 or 3 weeks ago when he was restarted on it.  Patient has reported malaise, myalgias, has been febrile, positive for influenza A.  Today feeling better clinically but remains febrile.  He is currently tolerating medications well, including Tamiflu which was started yesterday.  Reports improving mood, denies suicidal ideations, auditory hallucinations have decreased.  At this time will continue current medication regimen, consider gradual antidepressant/antipsychotic titration as needed.  Continue symptomatic treatment and to encourage p.o. fluids.   Treatment Plan Summary: Daily contact with patient to assess and evaluate symptoms and progress in treatment and Medication management  Treatment plan reviewed as below today 1/13 Encourage group and milieu participation to work on coping skills and  symptom reduction Encourage efforts to work on sobriety and relapse prevention, and continue to review likely benefits of smoking cessation on his overall health.  Continue Celexa 10 mg daily for depression and anxiety Continue Buspar 5 mg  BID for anxiety Continue Risperdal 1 mg  QHS for psychotic symptoms Continue Trazodone 50 mg  QHS PRN insomnia Continue Biktarvy  daily for HIV management Continue Neurontin 100 mg 3 times daily for anxiety Continue Tamiflu 75 mg twice daily x5 days for influenza Patient currently on droplet precautions.  Treatment team working on disposition planning options Recheck CBC , BMP in AM  Jenne Campus, MD 10/10/2018, 1:28 PM   Patient ID: Edwin Martinez, male   DOB: [birth year], 33 y.o.   MRN: [id number]

## 2018-10-10 NOTE — Plan of Care (Signed)
  Problem: Education: Goal: Mental status will improve Outcome: Progressing   Problem: Coping: Goal: Ability to demonstrate self-control will improve Outcome: Progressing   Problem: Health Behavior/Discharge Planning: Goal: Compliance with treatment plan for underlying cause of condition will improve Outcome: Progressing   Problem: Safety: Goal: Periods of time without injury will increase Outcome: Progressing   

## 2018-10-10 NOTE — BHH Group Notes (Signed)
Pt did not attend wrap up group this evening. Pt was in bed sick.

## 2018-10-11 DIAGNOSIS — F251 Schizoaffective disorder, depressive type: Secondary | ICD-10-CM

## 2018-10-11 DIAGNOSIS — J101 Influenza due to other identified influenza virus with other respiratory manifestations: Secondary | ICD-10-CM

## 2018-10-11 LAB — CBC WITH DIFFERENTIAL/PLATELET
Abs Immature Granulocytes: 0.02 10*3/uL (ref 0.00–0.07)
Basophils Absolute: 0 10*3/uL (ref 0.0–0.1)
Basophils Relative: 1 %
EOS ABS: 0.1 10*3/uL (ref 0.0–0.5)
Eosinophils Relative: 1 %
HCT: 47.7 % (ref 39.0–52.0)
Hemoglobin: 14.8 g/dL (ref 13.0–17.0)
IMMATURE GRANULOCYTES: 0 %
Lymphocytes Relative: 49 %
Lymphs Abs: 3.6 10*3/uL (ref 0.7–4.0)
MCH: 27.9 pg (ref 26.0–34.0)
MCHC: 31 g/dL (ref 30.0–36.0)
MCV: 90 fL (ref 80.0–100.0)
Monocytes Absolute: 0.8 10*3/uL (ref 0.1–1.0)
Monocytes Relative: 11 %
NEUTROS PCT: 38 %
Neutro Abs: 2.7 10*3/uL (ref 1.7–7.7)
Platelets: 217 10*3/uL (ref 150–400)
RBC: 5.3 MIL/uL (ref 4.22–5.81)
RDW: 14.8 % (ref 11.5–15.5)
WBC: 7.2 10*3/uL (ref 4.0–10.5)
nRBC: 0 % (ref 0.0–0.2)

## 2018-10-11 LAB — BASIC METABOLIC PANEL
Anion gap: 8 (ref 5–15)
BUN: 17 mg/dL (ref 6–20)
CO2: 26 mmol/L (ref 22–32)
Calcium: 8.7 mg/dL — ABNORMAL LOW (ref 8.9–10.3)
Chloride: 106 mmol/L (ref 98–111)
Creatinine, Ser: 1.11 mg/dL (ref 0.61–1.24)
GFR calc Af Amer: >60 mL/min (ref >60)
GFR calc non Af Amer: >60 mL/min (ref >60)
Glucose, Bld: 140 mg/dL — ABNORMAL HIGH (ref 70–99)
Potassium: 4.2 mmol/L (ref 3.5–5.1)
Sodium: 140 mmol/L (ref 135–145)

## 2018-10-11 NOTE — Progress Notes (Signed)
[facility] MD Progress Note  10/11/2018 10:52 AM Edwin Martinez  MRN:  [id number] Subjective: Patient is seen and examined.  Patient is a 33 year old male who was admitted to behavioral health after he presented to the hospital reporting worsening depression, auditory hallucinations, suicidal ideation, and recent overdose on Excedrin.  Objective: Patient is seen and examined.  Patient is a 33 year old male with the above-stated past psychiatric history with a past psychiatric history significant for schizoaffective disorder versus major depression with psychotic features as well as cocaine use disorder and possible psychosis secondary to cocaine use disorder.  He also has recently been diagnosed with influenza a, and also has a diagnosis of HIV.  He is seen in follow-up.  He stated his mood is improving.  He stated his major symptoms right now is feeling bad because of his influenza.  He has been started on Tamiflu on 10/09/2018.  He had been febrile earlier, but today he is afebrile.  He stated he still feels like flulike symptoms, but much improved from yesterday.  He denied any current psychotic symptoms.  He stated his mood and anxiety were improving.  His vital signs are stable with a blood pressure of 100/61, respirations are 16, pulse was 84, and temperature this a.m. was 98.8.  His T-max was 102.5 last evening.  He slept 6.25 hours last night.  He denied psychotic symptoms or suicidal ideation.  Principal Problem: Cocaine abuse with cocaine-induced mood disorder (HCC) Diagnosis: Principal Problem:   Cocaine abuse with cocaine-induced mood disorder (HCC) Active Problems:   Schizoaffective disorder (HCC)   MDD (major depressive disorder), severe (HCC)  Total Time spent with patient: 20 minutes  Past Psychiatric History: See admission H&P  Past Medical History:  Past Medical History:  Diagnosis Date  . ADD (attention deficit disorder)   . Anxiety   . Asthma   . Bipolar 1 disorder (HCC)    . Depression   . HIV (human immunodeficiency virus infection) (HCC) dx'd 2008  . Hypertension   . Insomnia   . Intentional drug overdose (HCC)    [name] 02/25/2018  . Polysubstance abuse (HCC)    [name] 02/25/2018  . Schizophrenia (HCC)   . Seizures (H[facility])    "used to have little black-out szs where I'd drop out for 2-3 min then come back; nothing in the last 2-3-4years" (02/25/2018)  . Shingles     Past Surgical History:  Procedure Laterality Date  . DENTAL SURGERY     "had my eye teeth pulled down"   Family History:  Family History  Problem Relation Age of Onset  . Huntington's disease Father   . Heart disease Mother   . Suicidality Maternal Uncle   . Suicidality Maternal Grandmother    Family Psychiatric  History: See admission H&P Social History:  Social History   Substance and Sexual Activity  Alcohol Use Yes  . Alcohol/week: 4.0 standard drinks  . Types: 4 Cans of beer per week   Comment: occasionally, "socially"     Social History   Substance and Sexual Activity  Drug Use Yes  . Types: Marijuana, Cocaine   Comment: 10/06/2018 "self medicating occassionally"    Social History   Socioeconomic History  . Marital status: Single    Spouse name: Not on file  . Number of children: Not on file  . Years of education: Not on file  . Highest education level: Not on file  Occupational History  . Occupation: disability pending  Social Needs  . Financial  resource strain: Not on file  . Food insecurity:    Worry: Not on file    Inability: Not on file  . Transportation needs:    Medical: Not on file    Non-medical: Not on file  Tobacco Use  . Smoking status: Current Every Day Smoker    Packs/day: 0.50    Years: 20.00    Pack years: 10.00    Types: Cigarettes    Start date: 09/29/1991  . Smokeless tobacco: Never Used  Substance and Sexual Activity  . Alcohol use: Yes    Alcohol/week: 4.0 standard drinks    Types: 4 Cans of beer per week    Comment:  occasionally, "socially"  . Drug use: Yes    Types: Marijuana, Cocaine    Comment: 10/06/2018 "self medicating occassionally"  . Sexual activity: Yes    Birth control/protection: Condom  Lifestyle  . Physical activity:    Days per week: Not on file    Minutes per session: Not on file  . Stress: Not on file  Relationships  . Social connections:    Talks on phone: Not on file    Gets together: Not on file    Attends religious service: Not on file    Active member of club or organization: Not on file    Attends meetings of clubs or organizations: Not on file    Relationship status: Not on file  Other Topics Concern  . Not on file  Social History Narrative   ** Merged History Encounter **       ** Merged History Encounter **       Additional Social History:                         Sleep: Fair  Appetite:  Fair  Current Medications: Current Facility-Administered Medications  Medication Dose Route Frequency Provider Last Rate Last Dose  . benzonatate (TESSALON) capsule 100 mg  100 mg Oral TID PRN Oneta RackLewis, Tanika N, NP   100 mg at 10/08/18 1856  . bictegravir-emtricitabine-tenofovir AF (BIKTARVY) 50-200-25 MG per tablet 1 tablet  1 tablet Oral Daily Cobos, Rockey SituFernando A, MD   1 tablet at 10/11/18 0854  . busPIRone (BUSPAR) tablet 5 mg  5 mg Oral BID Cobos, Rockey SituFernando A, MD   5 mg at 10/11/18 0854  . citalopram (CELEXA) tablet 10 mg  10 mg Oral Daily Cobos, Rockey SituFernando A, MD   10 mg at 10/11/18 0854  . gabapentin (NEURONTIN) capsule 100 mg  100 mg Oral TID Cobos, Rockey SituFernando A, MD   100 mg at 10/11/18 0854  . ibuprofen (ADVIL,MOTRIN) tablet 600 mg  600 mg Oral Q6H PRN Nira ConnBerry, Jason A, NP   600 mg at 10/09/18 1625  . loratadine (CLARITIN) tablet 10 mg  10 mg Oral Daily Oneta RackLewis, Tanika N, NP   10 mg at 10/11/18 0854  . nicotine (NICODERM CQ - dosed in mg/24 hours) patch 21 mg  21 mg Transdermal Daily Cobos, Rockey SituFernando A, MD   Stopped at 10/10/18 0815  . oseltamivir (TAMIFLU) capsule 75 mg  75  mg Oral BID Cobos, Rockey SituFernando A, MD   75 mg at 10/11/18 0854  . risperiDONE (RISPERDAL) tablet 1 mg  1 mg Oral QHS Cobos, Rockey SituFernando A, MD   1 mg at 10/09/18 2228  . traZODone (DESYREL) tablet 50 mg  50 mg Oral QHS PRN Cobos, Rockey SituFernando A, MD   50 mg at 10/09/18 2228    Lab Results:  No results found for this or any previous visit (from the past 48 hour(s)).  Blood Alcohol level:  Lab Results  Component Value Date   ETH <10 10/05/2018   ETH <10 06/19/2018    Metabolic Disorder Labs: Lab Results  Component Value Date   HGBA1C 5.6 12/24/2016   MPG 114 12/24/2016   MPG 111 05/04/2016   Lab Results  Component Value Date   PROLACTIN 28.8 (H) 12/24/2016   PROLACTIN 32.3 (H) 08/19/2016   Lab Results  Component Value Date   CHOL 147 08/31/2018   TRIG 189 (H) 08/31/2018   HDL 45 08/31/2018   CHOLHDL 3.3 08/31/2018   VLDL 26 12/24/2016   LDLCALC 74 08/31/2018   LDLCALC 72 04/06/2018    Physical Findings: AIMS: Facial and Oral Movements Muscles of Facial Expression: None, normal Lips and Perioral Area: None, normal Jaw: None, normal Tongue: None, normal,Extremity Movements Upper (arms, wrists, hands, fingers): None, normal Lower (legs, knees, ankles, toes): None, normal, Trunk Movements Neck, shoulders, hips: None, normal, Overall Severity Severity of abnormal movements (highest score from questions above): None, normal Incapacitation due to abnormal movements: None, normal Patient's awareness of abnormal movements (rate only patient's report): No Awareness, Dental Status Current problems with teeth and/or dentures?: No Does patient usually wear dentures?: No  CIWA:  CIWA-Ar Total: 0 COWS:  COWS Total Score: 1  Musculoskeletal: Strength & Muscle Tone: within normal limits Gait & Station: normal Patient leans: N/A  Psychiatric Specialty Exam: Physical Exam  Nursing note and vitals reviewed. Constitutional: He is oriented to person, place, and time. He appears well-developed  and well-nourished.  HENT:  Head: Atraumatic.  Respiratory: Effort normal.  Neurological: He is alert and oriented to person, place, and time.    ROS  Blood pressure 100/61, pulse 84, temperature 98.8 F (37.1 C), temperature source Oral, resp. rate 16, height 5\' 7"  (1.702 m), weight 68.9 kg, SpO2 100 %.Body mass index is 23.81 kg/m.  General Appearance: Casual  Eye Contact:  Fair  Speech:  Normal Rate  Volume:  Normal  Mood:  Depressed  Affect:  Constricted  Thought Process:  Coherent and Descriptions of Associations: Intact  Orientation:  Full (Time, Place, and Person)  Thought Content:  Logical  Suicidal Thoughts:  No  Homicidal Thoughts:  No  Memory:  Immediate;   Fair Recent;   Fair Remote;   Fair  Judgement:  Intact  Insight:  Fair  Psychomotor Activity:  Normal  Concentration:  Concentration: Fair and Attention Span: Fair  Recall:  [employer] of Knowledge:  Fair  Language:  Fair  Akathisia:  Negative  Handed:  Right  AIMS (if indicated):     Assets:  Communication Skills Desire for Improvement Resilience Social Support  ADL's:  Intact  Cognition:  WNL  Sleep:  Number of Hours: 6.25     Treatment Plan Summary: Daily contact with patient to assess and evaluate symptoms and progress in treatment, Medication management and Plan : Patient is seen and examined.33 year old male, presented to the hospital reporting worsening depression, auditory hallucinations which she describes as insulting, demeaning. Suicidal ideations and recent overdose on Excedrin.  It appears as though his influenza is improving, and his mood and affect are improving as well.  No change in his current medications today.  Hopefully as things continue to improve from a psychiatric and medical situation he will improve.  1.  Continue Celexa 10 mg p.o. daily for depression and anxiety. 2.  Continue BuSpar 5  mg p.o. twice daily for anxiety. 3.  Continue Risperdal 1 mg p.o. nightly for psychotic  symptoms. 4.  Continue trazodone 50 mg nightly as needed for insomnia. 5.  Continue Biktarvy daily for HIV management. 6.  Continue Neurontin 100 mg p.o. 3 times daily for anxiety. 7.  Continue Tamiflu 75 mg twice a day for 4 more days for his influenza. 8.  Continue droplet precautions. 9.  Discharge planning.-Is in progress  Antonieta Pert, MD 10/11/2018, 10:52 AM

## 2018-10-11 NOTE — Progress Notes (Signed)
Recreation Therapy Notes  Animal-Assisted Activity (AAA) Program Checklist/Progress Notes Patient Eligibility Criteria Checklist & Daily Group note for Rec Tx Intervention  Date: 1.14.20 Time: 1430 [address]   AAA/T Program Assumption of Risk Form signed by Engineer, productionatient/ or Parent Legal Guardian  YES   Patient is free of allergies or sever asthma  YES   Patient reports no fear of animals  YES   Patient reports no history of cruelty to animals  YES   Patient understands his/her participation is voluntary  YES   Patient washes hands before animal contact  YES   Patient washes hands after animal contact  YES         Education: Charity fundraiserHand Washing, Appropriate Animal Interaction   Education Outcome: Acknowledges understanding/In group clarification offered/Needs additional education.   Clinical Observations/Feedback: Pt did not attend group.    Caroll RancherMarjette Chukwuka Festa, LRT/CTRS         Caroll RancherLindsay, Saia Derossett A 10/11/2018 3:37 PM

## 2018-10-11 NOTE — B[facility] Group Notes (Signed)
LCSW Group Therapy Note 10/11/2018 10:46 AM  Type of Therapy and Topic: Group Therapy: DBT House  Participation Level: Did Not Attend   Description of Group:  In this group patients will be encouraged to explore  their values, behaviors they want to change, emotions they wish to increase, protective factors, supports, coping skills, and motivational factors. They will be guided to discuss their thoughts, feelings, and behaviors related to these obstacles. The group will be asked to individually process the activity and share their insights with the group. This group will be process-oriented, with patients participating in exploration of their own experiences as well as giving and receiving support and challenge from other group members.   Therapeutic Goals: 1. Patient will identify their values 2. Patient will identify behaviors they wish to modify  3. Patient will identify feelings and emotions they wish to increase 4. Patient will identify strengths, supports, protective factors 5. Patient will identify coping skills 6. Patient will identify goals and motivating factors for change   Summary of Patient Progress Patient invited to attend group, elected not to.  Therapeutic Modalities:  Dialectical Behavioral Therapy  Motivational Interviewing Relapse Prevention Therapy

## 2018-10-11 NOTE — Plan of Care (Signed)
Nurse discussed anxiety, depression, coping skills with patient. 

## 2018-10-11 NOTE — B[facility] Group Notes (Signed)
Pt did not attend wrap up group this evening. Pt was asleep recovering from the flu.

## 2018-10-11 NOTE — B[facility] Group Notes (Signed)
[facility] Group Notes:  (Nursing/MHT/Case Management/Adjunct)  Date:  10/11/2018  Time:  3:15 pm  Type of Therapy:  Psychoeducational Skills  Participation Level:  Did not attend  Participation Quality:    Affect:    Cognitive:    Insight:    Engagement in Group:    Modes of Intervention:    Summary of Progress/Problems:  Earline Mayotte 10/11/2018, 4:45 PM

## 2018-10-11 NOTE — Progress Notes (Signed)
D:  Patient's self inventory sheet, patient has poor sleep, no sleep medicine.  Fair appetite, low energy level, good concentration.  Patient rated depression 7, hopeless and anxiety 5.  Denied physical problems.  Denied SI.  Denied physical pain.  Goal is work on housing and rehab. A:  Medications given throughout the day.  Emotional support and encouragement given patient. R:  Denied SI and HI, contracts for safety.  Denied A/V hallucinations.  Safety maintained with 15 minute checks.  Patient stated he is feeling better today, continues to feel weak.

## 2018-10-12 MED ORDER — NICOTINE 21 MG/24HR TD PT24: 21.0000 mg | MEDICATED_PATCH | Freq: Every day | TRANSDERMAL | 0 refills | Status: DC

## 2018-10-12 MED ORDER — RISPERIDONE 1 MG PO TABS: 1.0000 mg | ORAL_TABLET | Freq: Every day | ORAL | 0 refills | Status: DC

## 2018-10-12 MED ORDER — CITALOPRAM HYDROBROMIDE 10 MG PO TABS: 10.0000 mg | ORAL_TABLET | Freq: Every day | ORAL | 0 refills | Status: DC

## 2018-10-12 MED ORDER — BUSPIRONE HCL 5 MG PO TABS: 5.0000 mg | ORAL_TABLET | Freq: Two times a day (BID) | ORAL | 0 refills | Status: DC

## 2018-10-12 MED ORDER — TRAZODONE HCL 50 MG PO TABS: 50.0000 mg | ORAL_TABLET | Freq: Every evening | ORAL | 0 refills | Status: DC | PRN

## 2018-10-12 MED ORDER — GABAPENTIN 100 MG PO CAPS: 100.0000 mg | ORAL_CAPSULE | Freq: Three times a day (TID) | ORAL | 0 refills | Status: DC

## 2018-10-12 MED ORDER — OSELTAMIVIR PHOSPHATE 75 MG PO CAPS: 75.0000 mg | ORAL_CAPSULE | Freq: Two times a day (BID) | ORAL | 0 refills | Status: DC

## 2018-10-12 MED ORDER — LORATADINE 10 MG PO TABS: 10.0000 mg | ORAL_TABLET | Freq: Every day | ORAL | 0 refills | Status: DC

## 2018-10-12 NOTE — B[facility] Suicide Risk Assessment (Signed)
[facility] Discharge Suicide Risk Assessment   Principal Problem: Schizoaffective disorder [facility]) Discharge Diagnoses: Principal Problem:   Schizoaffective disorder (HCC) Active Problems:   HIV (human immunodeficiency virus infection) (HCC)   Cocaine abuse with cocaine-induced mood disorder (HCC)   MDD (major depressive disorder), severe (HCC)   Influenza A   Total Time spent with patient: 15 minutes  Musculoskeletal: Strength & Muscle Tone: within normal limits Gait & Station: normal Patient leans: N/A  Psychiatric Specialty Exam: Review of Systems  All other systems reviewed and are negative.   Blood pressure 103/69, pulse 72, temperature 98.4 F (36.9 C), temperature source Oral, resp. rate 20, height 5\' 7"  (1.702 m), weight 68.9 kg, SpO2 100 %.Body mass index is 23.81 kg/m.  General Appearance: Casual  Eye Contact::  Good  Speech:  Normal Rate409  Volume:  Normal  Mood:  Euthymic  Affect:  Congruent  Thought Process:  Coherent and Descriptions of Associations: Intact  Orientation:  Full (Time, Place, and Person)  Thought Content:  Logical  Suicidal Thoughts:  No  Homicidal Thoughts:  No  Memory:  Immediate;   Fair Recent;   Fair Remote;   Fair  Judgement:  Intact  Insight:  Fair  Psychomotor Activity:  Normal  Concentration:  Good  Recall:  Good  Fund of Knowledge:Good  Language: Good  Akathisia:  Negative  Handed:  Right  AIMS (if indicated):     Assets:  Communication Skills Desire for Improvement Financial Resources/Insurance Housing Resilience Social Support  Sleep:  Number of Hours: 5.75  Cognition: WNL  ADL's:  Intact   Mental Status Per Nursing Assessment::   On Admission:  Suicidal ideation indicated by patient, Suicide plan, Self-harm thoughts, Intention to act on suicide plan, Plan includes specific time, place, or method, Self-harm behaviors, Belief that plan would result in death  Demographic Factors:  Male, Cardell Peach, lesbian, or bisexual  orientation, Low socioeconomic status and Unemployed  Loss Factors: Financial problems/change in socioeconomic status  Historical Factors: Impulsivity  Risk Reduction Factors:   Positive social support and Positive coping skills or problem solving skills  Continued Clinical Symptoms:  Depression:   Comorbid alcohol abuse/dependence Impulsivity Alcohol/Substance Abuse/Dependencies  Cognitive Features That Contribute To Risk:  None    Suicide Risk:  Minimal: No identifiable suicidal ideation.  Patients presenting with no risk factors but with morbid ruminations; may be classified as minimal risk based on the severity of the depressive symptoms  Follow-up Information    [facility] Follow up.   Specialty:  Addiction Medicine Contact information: [address] [place] [address] [phone]        [facility]. Go on 10/18/2018.   Why:  Screening for admission appointment is Tuesday, 01/21 at 7:45am. Please be sure to bring your Proof of residency, SSN and any discharge paperwork from this hospitalization. Must have two week suppy of medications.  Contact information: [address] [place] [address] [phone]        [facility]. Go on 10/14/2018.   Specialty:  Behavioral Health Why:  Your hospital follow up on Friday, 1/17 at 8:00a.  Please bring: photo ID, proof of insurance, social security card, current medications and any discharge paperwork from this hospitalization.  Contact information: [address] [place] [address] [phone]           Plan Of Care/Follow-up recommendations:  Activity:  ad lib  Antonieta Pert, MD 10/12/2018, 8:11 AM

## 2018-10-12 NOTE — Progress Notes (Signed)
  [facility] Adult Case Management Discharge Plan :  Will you be returning to the same living situation after discharge:  Yes,  patient reports he is discharging to the [facility] At discharge, do you have transportation home?: Yes,  bus passes Do you have the ability to pay for your medications: No.  Release of information consent forms completed and in the chart;  Patient's signature needed at discharge.  Patient to Follow up at: Follow-up Information    [facility] Follow up.   Specialty:  Addiction Medicine Contact information: [address] [place] [address] [phone]        [facility]. Go on 10/18/2018.   Why:  Screening for admission appointment is Tuesday, 01/21 at 7:45am. Please be sure to bring your Proof of residency, SSN and any discharge paperwork from this hospitalization. Must have two week suppy of medications.  Contact information: [address] [place] [address] [phone]        [facility]. Go on 10/14/2018.   Specialty:  Behavioral Health Why:  Your hospital follow up on Friday, 1/17 at 8:00a.  Please bring: photo ID, proof of insurance, social security card, current medications and any discharge paperwork from this hospitalization.  Contact information: [address] [place] [address] [phone]        [facility]. Go on 11/08/2018.   Why:  Hospital follow up appoitment on 2/11 at 3:30p.  Contact information: [address] Phone: [phone] Fax: [phone]          Next level of care provider has access to [facility] Link:yes  Safety Planning and Suicide Prevention discussed: Yes,  with the patient     Has patient been referred to the Quitline?: N/A patient is not a smoker  Patient has been referred for addiction treatment: Yes  [name], LCSWA 10/12/2018, 2:18 PM

## 2018-10-12 NOTE — Discharge Summary (Signed)
Physician Discharge Summary Note  Patient:  Edwin Martinez is an 33 y.o., male MRN:  [id number] DOB:  Dec 13, 1985 Patient phone:  [phone] (home)  Patient address:   [place] [address],  Total Time spent with patient: Greater than 30 minutes  Date of Admission:  10/06/2018 Date of Discharge: 10/12/18  Reason for Admission: Worsening depression, suicidal ideations, auditory hallucinations & Excedrin overdose in a suicide attempt.  Principal Problem: Schizoaffective disorder [facility])  Discharge Diagnoses: Patient Active Problem List   Diagnosis Date Noted  . Influenza A [J10.1] 10/11/2018  . MDD (major depressive disorder), severe (HCC) [F32.2] 10/06/2018  . Diarrhea due to protozoan [A07.9]   . Giardial enteritis [A07.1]   . Norovirus [A08.11]   . MDD (major depressive disorder) [F32.9] 02/26/2018  . Suicide attempt (HCC) [T14.91XA]   . Depression [F32.9] 11/08/2017  . Chest tightness [R07.89]   . Cough [R05]   . Leukocytosis [D72.829]   . SOB (shortness of breath) [R06.02]   . Nausea vomiting and diarrhea [R11.2, R19.7]   . Asthma exacerbation [J45.901] 08/15/2017  . Elevated LFTs [R94.5] 08/12/2017  . 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 Herpes zoster [B02.9] 03/06/2017  . Polysubstance abuse (HCC) [F19.10] 03/06/2017  . Homelessness [Z59.0] 03/06/2017  . Schizoaffective disorder (HCC) [F25.9] 12/23/2016  . Suicidal ideation [R45.851]   . Cannabis use disorder, moderate, dependence (HCC) [F12.20] 07/27/2016  . Tobacco user [Z72.0] 07/27/2016  . Intentional drug overdose (HCC) [T50.902A] 07/18/2016  . Asthma [J45.909] 05/20/2007  . HIV (human immunodeficiency virus infection) (HCC) [B20] 05/05/2007   Past Psychiatric History: Several prior psychiatric admissions,  Bipolar Disorder, Schizophrenia, prior suicidal attempts. Similar admission at [facility] for similar presentation as above - depression, SI,  hallucinations  Past Medical History:  Past Medical History:  Diagnosis Date  . ADD (attention deficit disorder)   . Anxiety   . Asthma   . Bipolar 1 disorder (HCC)   . Depression   . HIV (human immunodeficiency virus infection) (HCC) dx'd 2008  . Hypertension   . Insomnia   . Intentional drug overdose (HCC)    [name] 02/25/2018  . Polysubstance abuse (HCC)    [name] 02/25/2018  . Schizophrenia (HCC)   . Seizures (HCC)    "used to have little black-out szs where I'd drop out for 2-3 min then come back; nothing in the last 2-3-4years" (02/25/2018)  . Shingles     Past Surgical History:  Procedure Laterality Date  . DENTAL SURGERY     "had my eye teeth pulled down"   Family History:  Family History  Problem Relation Age of Onset  . Huntington's disease Father   . Heart disease Mother   . Suicidality Maternal Uncle   . Suicidality Maternal Grandmother    Family Psychiatric  History: See H&P.  Social History:  Social History   Substance and Sexual Activity  Alcohol Use Yes  . Alcohol/week: 4.0 standard drinks  . Types: 4 Cans of beer per week   Comment: occasionally, "socially"     Social History   Substance and Sexual Activity  Drug Use Yes  . Types: Marijuana, Cocaine   Comment: 10/06/2018 "self medicating occassionally"    Social History   Socioeconomic History  . Marital status: Single    Spouse name: Not on file  . Number of children: Not on file  . Years of education: Not on file  . Highest education level: Not on file  Occupational History  . Occupation:  disability pending  Social Needs  . Financial resource strain: Not on file  . Food insecurity:    Worry: Not on file    Inability: Not on file  . Transportation needs:    Medical: Not on file    Non-medical: Not on file  Tobacco Use  . Smoking status: Current Every Day Smoker    Packs/day: 0.50    Years: 20.00    Pack years: 10.00    Types: Cigarettes    Start date: 09/29/1991  . Smokeless  tobacco: Never Used  Substance and Sexual Activity  . Alcohol use: Yes    Alcohol/week: 4.0 standard drinks    Types: 4 Cans of beer per week    Comment: occasionally, "socially"  . Drug use: Yes    Types: Marijuana, Cocaine    Comment: 10/06/2018 "self medicating occassionally"  . Sexual activity: Yes    Birth control/protection: Condom  Lifestyle  . Physical activity:    Days per week: Not on file    Minutes per session: Not on file  . Stress: Not on file  Relationships  . Social connections:    Talks on phone: Not on file    Gets together: Not on file    Attends religious service: Not on file    Active member of club or organization: Not on file    Attends meetings of clubs or organizations: Not on file    Relationship status: Not on file  Other Topics Concern  . Not on file  Social History Narrative   ** Merged History Encounter **       ** Merged History Encounter **       Hospital Course: (Per Md's admission evaluation): 33 year old male,currently homeless, presented to ED voluntarily reporting worsening depression, suicidal ideations, auditory hallucinations, and Excedrin overdose (reports took about 20 tablets). He attributes worsening depression to significant stressors, mainly homelessness ( states he has been staying in an abandoned house, isolated from other people) and being off psychiatric medications for several weeks to months. He also reports he recently has been abusing crack cocaine 2-3 times a week. States he has been experiencing intermittent suicidal ideations over recent weeks. Endorses neuro-vegetative symptoms of depression as below. Describes hallucinations as demeaning, insulting.  This is one of several discharged summaries [facility]. [facility] hospital[facility] for mood stabilization treatments. He has hx of psychiatric hospitalizations in other neighboring hospitals each time presenting with similar  complaints or symptoms. He has hx of suicide attempts. His UDS on this admission was positive for cocaine. He was recommended for mood stabilization treatments.   After the above admission assessment, the medication regimen targeting Emmanuell's presenting symptoms were discussed. And with his consent, initiated. He was medicated & discharged on the medications as listed below. He was also enrolled & participated in the group counseling sessions being offered & held on this unit. He learned coping skills that should help him after discharge to cope better to maintain sobriety & mood stability.  As his treatment progressed, daily assessment notes marked improvement in Hershey's symptoms. Last time he heard the voice was a while ago. Says it was very faint. No command then. Feels good.  He is optimistic about his progress. No residual psychotic features. No craving for cocaine. No anxiety. The features of depression has markedly improved since starting medications. No thoughts of violence. No access to weapons. No new stressors. Historically, has past suicidal  behaviors, which is considered chronic in nature. He currently denies any plans or intent to hurt himself.  Jedidiah's case was presented during treatment team meeting this morning. The nursing staff reports that patient has been appropriate on the unit. Patient has been interacting well with peers. No behavioral issues. Patient has not voiced any suicidal thoughts. Patient has not been observed to be internally stimulated or preoccupied. Patient has been adherent with his treatment recommendations. Patient has been tolerating his medications well.   During the care review & discussion of his progress this morning at the treatment team meeting. Team members feel that Dorsey is back to his baseline level of function. The team agrees with plan to discharge patient today to continue mental health care on an outpatient basis as noted below. He has been referred  & has an appointment at the center for infectious disease clinic here in [place], [place] for his HIV/medical care. And for mental health care & medication management, Martel will be receiving these services at the [facility] in [place] & [facility] in [place], both in [place]. He is provided with all the necessary information needed to make these appointments without any problems.   He was provided with a 7 days worth, supply samples of his [facility] discharge medications. He was able to engage in safety planning including plan to return to [facility] or contact emergency services if he feels unable to maintain his own safety or the safety of others. Pt had no further questions, comments or concerns. He left [facility] with all personal belongings in no apparent distress.  Physical Findings: AIMS: Facial and Oral Movements Muscles of Facial Expression: None, normal Lips and Perioral Area: None, normal Jaw: None, normal Tongue: None, normal,Extremity Movements Upper (arms, wrists, hands, fingers): None, normal Lower (legs, knees, ankles, toes): None, normal, Trunk Movements Neck, shoulders, hips: None, normal, Overall Severity Severity of abnormal movements (highest score from questions above): None, normal Incapacitation due to abnormal movements: None, normal Patient's awareness of abnormal movements (rate only patient's report): No Awareness, Dental Status Current problems with teeth and/or dentures?: No Does patient usually wear dentures?: No  CIWA:  CIWA-Ar Total: 1 COWS:  COWS Total Score: 1  Musculoskeletal: Strength & Muscle Tone: within normal limits Gait & Station: normal Patient leans: N/A  Psychiatric Specialty Exam: Physical Exam  Nursing note and vitals reviewed. Constitutional: He is oriented to person, place, and time. He appears well-developed and well-nourished.  HENT:  Head: Normocephalic.  Eyes: Pupils are equal, round, and reactive to light.  Neck: Normal range of motion.   Cardiovascular: Normal rate.  Respiratory: Effort normal.  GI: Soft.  Genitourinary:    Genitourinary Comments: Deferred   Musculoskeletal: Normal range of motion.  Neurological: He is alert and oriented to person, place, and time.  Skin: Skin is warm.    Review of Systems  Constitutional: Negative.   HENT: Negative.   Eyes: Negative.   Respiratory: Negative.  Negative for cough and shortness of breath.   Cardiovascular: Negative.  Negative for chest pain and palpitations.  Gastrointestinal: Negative.  Negative for abdominal pain, heartburn, nausea and vomiting.  Genitourinary: Negative.   Musculoskeletal: Negative.   Skin: Negative.   Neurological: Negative.   Endo/Heme/Allergies: Negative.   Psychiatric/Behavioral: Positive for depression (Stable) and substance abuse (Hx. Cocaine use disorder). Negative for hallucinations, memory loss and suicidal ideas. The patient has insomnia (Stable). The patient is not nervous/anxious (Stable).     Blood pressure 103/69, pulse 72, temperature 98.4  F (36.9 C), temperature source Oral, resp. rate 20, height 5\' 7"  (1.702 m), weight 68.9 kg, SpO2 100 %.Body mass index is 23.81 kg/m.  See Md's discharge SRA.   Has this patient used any form of tobacco in the last 30 days? (Cigarettes, Smokeless Tobacco, Cigars, and/or Pipes): Yes, an FDA-approved tobacco cessation medication was offered at discharge.  Blood Alcohol level:  Lab Results  Component Value Date   ETH <10 10/05/2018   ETH <10 06/19/2018   Metabolic Disorder Labs:  Lab Results  Component Value Date   HGBA1C 5.6 12/24/2016   MPG 114 12/24/2016   MPG 111 05/04/2016   Lab Results  Component Value Date   PROLACTIN 28.8 (H) 12/24/2016   PROLACTIN 32.3 (H) 08/19/2016   Lab Results  Component Value Date   CHOL 147 08/31/2018   TRIG 189 (H) 08/31/2018   HDL 45 08/31/2018   CHOLHDL 3.3 08/31/2018   VLDL 26 12/24/2016   LDLCALC 74 08/31/2018   LDLCALC 72 04/06/2018   See  Psychiatric Specialty Exam and Suicide Risk Assessment completed by Attending Physician prior to discharge.  Discharge destination:  Home  Is patient on multiple antipsychotic therapies at discharge:  No   Has Patient had three or more failed trials of antipsychotic monotherapy by history:  No  Recommended Plan for Multiple Antipsychotic Therapies: NA  Discharge Instructions    Discharge instructions   Complete by:  As directed    Patient is instructed to take all prescribed medications as recommended. Report any side effects or adverse reactions to your outpatient psychiatrist. Patient is instructed to abstain from alcohol and illegal drugs while on prescription medications. In the event of worsening symptoms, patient is instructed to call the crisis hotline, 911, or go to the nearest emergency department for evaluation and treatment.     Allergies as of 10/12/2018      Reactions   Magnesium-containing Compounds Other (See Comments)   This medication is contraindicated with pts HIV meds.     Peanut-containing Drug Products Anaphylaxis   Esomeprazole Magnesium Cough   Atripla [efavirenz-emtricitab-tenofovir] Other (See Comments)   Reaction:  Suicidal thoughts    Atripla [efavirenz-emtricitab-tenofovir]    Bactrim [sulfamethoxazole-trimethoprim]    Magnesium-containing Compounds    Nexium [esomeprazole Magnesium]    Peanut-containing Drug Products    Penicillins    Bactrim [sulfamethoxazole-trimethoprim] Rash   Penicillins Rash, Other (See Comments)   Has patient had a PCN reaction causing immediate rash, facial/tongue/throat swelling, SOB or lightheadedness with hypotension: Yes Has patient had a PCN reaction causing severe rash involving mucus membranes or skin necrosis: No Has patient had a PCN reaction that required hospitalization No Has patient had a PCN reaction occurring within the last 10 years: No If all of the above answers are "NO", then may proceed with Cephalosporin  use.      Medication List    STOP taking these medications   benztropine 0.5 MG tablet Commonly known as:  COGENTIN   hydrOXYzine 25 MG tablet Commonly known as:  ATARAX/VISTARIL     TAKE these medications     Indication  bictegravir-emtricitabine-tenofovir AF 50-200-25 MG Tabs tablet Commonly known as:  BIKTARVY Take 1 tablet by mouth daily.  Indication:  HIV Disease   busPIRone 5 MG tablet Commonly known as:  BUSPAR Take 1 tablet (5 mg total) by mouth 2 (two) times daily. For anxiety  Indication:  Anxiety   citalopram 10 MG tablet Commonly known as:  CELEXA Take 1 tablet (10  mg total) by mouth daily. For mood Start taking on:  October 13, 2018  Indication:  Mood   gabapentin 100 MG capsule Commonly known as:  NEURONTIN Take 1 capsule (100 mg total) by mouth 3 (three) times daily. For anxiety/agitation What changed:    medication strength  how much to take  additional instructions  Indication:  Anxiety/agitation   loratadine 10 MG tablet Commonly known as:  CLARITIN Take 1 tablet (10 mg total) by mouth daily. Start taking on:  October 13, 2018  Indication:  Allergies   nicotine 21 mg/24hr patch Commonly known as:  NICODERM CQ - dosed in mg/24 hours Place 1 patch (21 mg total) onto the skin daily. For tobacco cessation  Indication:  Nicotine Addiction   oseltamivir 75 MG capsule Commonly known as:  TAMIFLU Take 1 capsule (75 mg total) by mouth 2 (two) times daily.  Indication:  Influenza A   risperiDONE 1 MG tablet Commonly known as:  RISPERDAL Take 1 tablet (1 mg total) by mouth at bedtime. For hallucinations What changed:    when to take this  additional instructions  Indication:  Psychosis   traZODone 50 MG tablet Commonly known as:  DESYREL Take 1 tablet (50 mg total) by mouth at bedtime as needed for sleep.  Indication:  Trouble Sleeping      Follow-up Information    [facility] Follow up.   Specialty:   Addiction Medicine Contact information: [address] [place] [address] [phone]        Services, [facility]. Go on 10/18/2018.   Why:  Screening for admission appointment is Tuesday, 01/21 at 7:45am. Please be sure to bring your Proof of residency, SSN and any discharge paperwork from this hospitalization. Must have two week suppy of medications.  Contact information: [address] [place] [address] [phone]        [facility]. Go on 10/14/2018.   Specialty:  Behavioral Health Why:  Your hospital follow up on Friday, 1/17 at 8:00a.  Please bring: photo ID, proof of insurance, social security card, current medications and any discharge paperwork from this hospitalization.  Contact information: [address] [place] [address] [phone]        [facility]. Go on 11/08/2018.   Why:  Hospital follow up appoitment on 2/11 at 3:30p.  Contact information: [address] [place] Phone: [phone] Fax: [phone]         Follow-up recommendations: Activity:  As tolerated Diet: As recommended by your primary care doctor. Keep all scheduled follow-up appointments as recommended.   Comments:  Patient is instructed prior to discharge to: Take all medications as prescribed by his/her mental healthcare provider. Report any adverse effects and or reactions from the medicines to his/her outpatient provider promptly. Patient has been instructed & cautioned: To not engage in alcohol and or illegal drug use while on prescription medicines. In the event of worsening symptoms, patient is instructed to call the crisis hotline, 911 and or go to the nearest ED for appropriate evaluation and treatment of symptoms. To follow-up with his/her primary care provider for your other medical issues, concerns and or health care needs.   Signed: Armandina Stammer, NP, PMHNP, FNP-BC 10/12/2018, 2:40 PM

## 2018-10-12 NOTE — Tx Team (Signed)
Interdisciplinary Treatment and Diagnostic Plan Update  10/12/2018 Time of Session: Edwin Martinez MRN: [id number]  Principal Diagnosis: Schizoaffective disorder [facility])  Secondary Diagnoses: Principal Problem:   Schizoaffective disorder (HCC) Active Problems:   HIV (human immunodeficiency virus infection) (HCC)   Cocaine abuse with cocaine-induced mood disorder (HCC)   MDD (major depressive disorder), severe (HCC)   Influenza A   Current Medications:  Current Facility-Administered Medications  Medication Dose Route Frequency Provider Last Rate Last Dose  . benzonatate (TESSALON) capsule 100 mg  100 mg Oral TID PRN Oneta Rack, NP   100 mg at 10/08/18 1856  . bictegravir-emtricitabine-tenofovir AF (BIKTARVY) 50-200-25 MG per tablet 1 tablet  1 tablet Oral Daily Cobos, Rockey Situ, MD   1 tablet at 10/12/18 0802  . busPIRone (BUSPAR) tablet 5 mg  5 mg Oral BID Cobos, Rockey Situ, MD   5 mg at 10/12/18 0802  . citalopram (CELEXA) tablet 10 mg  10 mg Oral Daily Cobos, Rockey Situ, MD   10 mg at 10/12/18 0802  . gabapentin (NEURONTIN) capsule 100 mg  100 mg Oral TID Cobos, Rockey Situ, MD   100 mg at 10/12/18 0802  . ibuprofen (ADVIL,MOTRIN) tablet 600 mg  600 mg Oral Q6H PRN Nira Conn A, NP   600 mg at 10/09/18 1625  . loratadine (CLARITIN) tablet 10 mg  10 mg Oral Daily Oneta Rack, NP   10 mg at 10/12/18 0802  . nicotine (NICODERM CQ - dosed in mg/24 hours) patch 21 mg  21 mg Transdermal Daily Cobos, Rockey Situ, MD   Stopped at 10/10/18 0815  . oseltamivir (TAMIFLU) capsule 75 mg  75 mg Oral BID Cobos, Rockey Situ, MD   75 mg at 10/12/18 0803  . risperiDONE (RISPERDAL) tablet 1 mg  1 mg Oral QHS Cobos, Rockey Situ, MD   1 mg at 10/09/18 2228  . traZODone (DESYREL) tablet 50 mg  50 mg Oral QHS PRN Cobos, Rockey Situ, MD   50 mg at 10/09/18 2228   PTA Medications: Medications Prior to Admission  Medication Sig Dispense Refill Last Dose  . benztropine (COGENTIN) 0.5 MG tablet  Take 1 tablet (0.5 mg total) by mouth at bedtime. (Patient not taking: Reported on 10/05/2018) 30 tablet 0 Not Taking at Unknown time  . bictegravir-emtricitabine-tenofovir AF (BIKTARVY) 50-200-25 MG TABS tablet Take 1 tablet by mouth daily. (Patient not taking: Reported on 10/05/2018) 30 tablet 0 Not Taking at Unknown time  . gabapentin (NEURONTIN) 300 MG capsule Take 1 capsule (300 mg total) by mouth 3 (three) times daily. For pain (Patient not taking: Reported on 10/05/2018) 90 capsule 0 Not Taking at Unknown time  . hydrOXYzine (ATARAX/VISTARIL) 25 MG tablet Take 1 tablet (25 mg total) by mouth 3 (three) times daily as needed for anxiety or nausea. (Patient not taking: Reported on 10/05/2018) 30 tablet 0 Not Taking at Unknown time  . risperiDONE (RISPERDAL) 1 MG tablet Take 1 tablet (1 mg total) by mouth 2 (two) times daily. For mood control (Patient not taking: Reported on 10/05/2018) 60 tablet 0 Not Taking at Unknown time  . traZODone (DESYREL) 50 MG tablet Take 1 tablet (50 mg total) by mouth at bedtime as needed for sleep. (Patient not taking: Reported on 10/05/2018) 30 tablet 0 Not Taking at Unknown time    Patient Stressors: Financial difficulties Health problems Medication change or noncompliance  Patient Strengths: Ability for insight Active sense of humor Average or above average intelligence Capable of independent living  Treatment Modalities: Medication Management, Group therapy, Case management,  1 to 1 session with clinician, Psychoeducation, Recreational therapy.   Physician Treatment Plan for Primary Diagnosis: Schizoaffective disorder (HCC) Long Term Goal(s): Improvement in symptoms so as ready for discharge Improvement in symptoms so as ready for discharge   Short Term Goals: Ability to identify changes in lifestyle to reduce recurrence of condition will improve Ability to maintain clinical measurements within normal limits will improve Ability to identify changes in lifestyle to  reduce recurrence of condition will improve Ability to identify triggers associated with substance abuse/mental health issues will improve  Medication Management: Evaluate patient's response, side effects, and tolerance of medication regimen.  Therapeutic Interventions: 1 to 1 sessions, Unit Group sessions and Medication administration.  Evaluation of Outcomes: Adequate for Discharge  Physician Treatment Plan for Secondary Diagnosis: Principal Problem:   Schizoaffective disorder (HCC) Active Problems:   HIV (human immunodeficiency virus infection) (HCC)   Cocaine abuse with cocaine-induced mood disorder (HCC)   MDD (major depressive disorder), severe (HCC)   Influenza A  Long Term Goal(s): Improvement in symptoms so as ready for discharge Improvement in symptoms so as ready for discharge   Short Term Goals: Ability to identify changes in lifestyle to reduce recurrence of condition will improve Ability to maintain clinical measurements within normal limits will improve Ability to identify changes in lifestyle to reduce recurrence of condition will improve Ability to identify triggers associated with substance abuse/mental health issues will improve     Medication Management: Evaluate patient's response, side effects, and tolerance of medication regimen.  Therapeutic Interventions: 1 to 1 sessions, Unit Group sessions and Medication administration.  Evaluation of Outcomes: Adequate for Discharge   RN Treatment Plan for Primary Diagnosis: Schizoaffective disorder (HCC) Long Term Goal(s): Knowledge of disease and therapeutic regimen to maintain health will improve  Short Term Goals: Ability to participate in decision making will improve, Ability to verbalize feelings will improve, Ability to disclose and discuss suicidal ideas, Ability to identify and develop effective coping behaviors will improve and Compliance with prescribed medications will improve  Medication Management: RN  will administer medications as ordered by provider, will assess and evaluate patient's response and provide education to patient for prescribed medication. RN will report any adverse and/or side effects to prescribing provider.  Therapeutic Interventions: 1 on 1 counseling sessions, Psychoeducation, Medication administration, Evaluate responses to treatment, Monitor vital signs and CBGs as ordered, Perform/monitor CIWA, COWS, AIMS and Fall Risk screenings as ordered, Perform wound care treatments as ordered.  Evaluation of Outcomes: Adequate for Discharge   LCSW Treatment Plan for Primary Diagnosis: Schizoaffective disorder [facility](HCC) Long Term Goal(s): Safe transition to appropriate next level of care at discharge, Engage patient in therapeutic group addressing interpersonal concerns.  Short Term Goals: Engage patient in aftercare planning with referrals and resources  Therapeutic Interventions: Assess for all discharge needs, 1 to 1 time with Social worker, Explore available resources and support systems, Assess for adequacy in community support network, Educate family and significant other(s) on suicide prevention, Complete Psychosocial Assessment, Interpersonal group therapy.  Evaluation of Outcomes: Adequate for Discharge   Progress in Treatment: Attending groups: Yes. Participating in groups: Yes. Taking medication as prescribed: Yes. Toleration medication: Yes. Family/Significant other contact made: No, will contact:  patient declined collateral contacts  Patient understands diagnosis: Yes. Discussing patient identified problems/goals with staff: Yes. Medical problems stabilized or resolved: Yes. Denies suicidal/homicidal ideation: Yes. Issues/concerns per patient self-inventory: No. Other:   New problem(s) identified: None  New Short Term/Long Term Goal(s): medication stabilization, elimination of SI thoughts, development of comprehensive mental wellness plan.   Patient Goals:   Medication management   Discharge Plan or Barriers: Patient is discharging to the [facility] and will follow up with [facility] and [facility] for outpatient medication management and therapy services.   Reason for Continuation of Hospitalization: None   Estimated Length of Stay: Discharged, 10/12/2018  Attendees: Patient:  10/12/2018 8:33 AM  Physician: Dr. Landry Mellow, MD 10/12/2018 8:33 AM  Nursing: Arlyss Repress.Shela Commons RN 10/12/2018 8:33 AM  RN Care Manager: 10/12/2018 8:33 AM  Social Worker: Baldo Daub, LCSWA 10/12/2018 8:33 AM  Recreational Therapist:  10/12/2018 8:33 AM  Other: Marciano Sequin, NP  10/12/2018 8:33 AM  Other:  10/12/2018 8:33 AM  Other: 10/12/2018 8:33 AM    Scribe for Treatment Team: [name], LCSWA 10/12/2018 8:33 AM

## 2018-10-12 NOTE — Plan of Care (Signed)
  Problem: Education: Goal: Emotional status will improve Outcome: Adequate for Discharge   Problem: Education: Goal: Mental status will improve Outcome: Adequate for Discharge   Problem: Education: Goal: Verbalization of understanding the information provided will improve Outcome: Adequate for Discharge   

## 2018-10-12 NOTE — Therapy (Signed)
Occupational Therapy Group Note  Date:  10/12/2018 Time:  11:04 AM  Group Topic/Focus:  Self Esteem  Participation Level:  Active  Participation Quality:  Appropriate  Affect:  Blunted  Cognitive:  Appropriate  Insight: Improving  Engagement in Group:  Engaged  Modes of Intervention:  Activity, Discussion, Education and Socialization  Additional Comments:    S: "God made me with no mistakes"  O: OT tx with focus on self esteem building this date. Education given on definition of self esteem, with both causes of low and high self esteem identified. Activity given for pt to identify a positive/aspiring trait for each letter of the alphabet. Pt to work with peers to help complete activity and build positive thinking.   A: Pt presents to group with appropriate affect, jovial at times- but ultimately engaged and participatory. Pt completed A-Z activity with moderate cues from peer member of group. Increased jovial affect noted by end of session.   P: Education given on self esteem and how to improve this date. Handouts and activities given to help facilitate skills when reintegrating into community.   Dalphine Handing, MSOT, OTR/L Behavioral Health OT/ Acute Relief OT PHP Office: [phone]  Dalphine Handing 10/12/2018, 11:04 AM

## 2018-10-12 NOTE — Progress Notes (Signed)
Discharge note: Patient reviewed discharge paperwork with RN including prescriptions, follow up appointments, and lab work. Patient given the opportunity to ask questions. All concerns were addressed. All belongings were returned to patient. Denied SI/HI/AVH. Patient thanked staff for their care while at the hospital.  Patient was discharged to lobby with bus passes.

## 2018-10-21 ENCOUNTER — Emergency Department (HOSPITAL_COMMUNITY)
Admission: EM | Admit: 2018-10-21 | Discharge: 2018-10-21 | Disposition: A | Discharge status: Home / Self Care | Payer: Self-pay | Attending: Emergency Medicine | Admitting: Emergency Medicine

## 2018-10-21 ENCOUNTER — Other Ambulatory Visit: Payer: Self-pay

## 2018-10-21 ENCOUNTER — Encounter (HOSPITAL_COMMUNITY): Payer: Self-pay

## 2018-10-21 ENCOUNTER — Emergency Department (HOSPITAL_COMMUNITY): Payer: Self-pay

## 2018-10-21 DIAGNOSIS — Y939 Activity, unspecified: Secondary | ICD-10-CM | POA: Insufficient documentation

## 2018-10-21 DIAGNOSIS — X58XXXA Exposure to other specified factors, initial encounter: Secondary | ICD-10-CM | POA: Insufficient documentation

## 2018-10-21 DIAGNOSIS — F909 Attention-deficit hyperactivity disorder, unspecified type: Secondary | ICD-10-CM | POA: Insufficient documentation

## 2018-10-21 DIAGNOSIS — J45909 Unspecified asthma, uncomplicated: Secondary | ICD-10-CM | POA: Insufficient documentation

## 2018-10-21 DIAGNOSIS — S93401A Sprain of unspecified ligament of right ankle, initial encounter: Secondary | ICD-10-CM | POA: Insufficient documentation

## 2018-10-21 DIAGNOSIS — Y999 Unspecified external cause status: Secondary | ICD-10-CM | POA: Insufficient documentation

## 2018-10-21 DIAGNOSIS — I10 Essential (primary) hypertension: Secondary | ICD-10-CM | POA: Insufficient documentation

## 2018-10-21 DIAGNOSIS — Y929 Unspecified place or not applicable: Secondary | ICD-10-CM | POA: Insufficient documentation

## 2018-10-21 DIAGNOSIS — F17210 Nicotine dependence, cigarettes, uncomplicated: Secondary | ICD-10-CM | POA: Insufficient documentation

## 2018-10-21 DIAGNOSIS — Z79899 Other long term (current) drug therapy: Secondary | ICD-10-CM | POA: Insufficient documentation

## 2018-10-21 MED ORDER — IBUPROFEN 600 MG PO TABS: 600.0000 mg | ORAL_TABLET | Freq: Four times a day (QID) | ORAL | 0 refills | Status: DC | PRN

## 2018-10-21 NOTE — ED Triage Notes (Signed)
[facility]; [facility] [employer] [facility]; pt was at [facility] last wk and told that he cld be a possible candidate for COPD, as he smokes 1/2 pk daily, per EMS; hx of HIV, Hep C, Bipolar, and Schizo. 134/84, 82, 99% on RA, CBG 101

## 2018-10-21 NOTE — ED Notes (Signed)
Patient verbalizes understanding of discharge instructions. Opportunity for questioning and answers were provided. Armband removed by staff, pt discharged from ED.  

## 2018-10-21 NOTE — ED Provider Notes (Signed)
[facility] EMERGENCY DEPARTMENT Provider Note   CSN: [id number] Arrival date & time: 10/21/18  1721     History   Chief Complaint Chief Complaint  Patient presents with  . Ankle Pain    R     HPI Edwin Martinez is a 33 y.o. male.  Pt presents to the ED today with right ankle pain.  He has a hx of bipolar d/o, depression, and schizophrenia and was last hospitalized for SI from 1/9-1/15.  He also has a hx of HIV and Hep C.  He has been noncompliant with his HIV meds.  He has not yet been treated for hep C b/c the ID clinic wanted him to be compliant with his HIV meds first.  The pt does have a hx of substance abuse, usually cocaine.  He does not use IV drugs.  He panhandles to pay for his drug habit.  Pt noticed some right ankle pain yesterday.  No trauma.  He is homeless and was asleep in [employer].  The security guard woke him up and told him that his ankle was swollen.  Due to this, he called EMS to bring him here.      Past Medical History:  Diagnosis Date  . ADD (attention deficit disorder)   . Anxiety   . Asthma   . Bipolar 1 disorder (HCC)   . Depression   . HIV (human immunodeficiency virus infection) (HCC) dx'd 2008  . Hypertension   . Insomnia   . Intentional drug overdose (HCC)    [name] 02/25/2018  . Polysubstance abuse (HCC)    [name] 02/25/2018  . Schizophrenia (HCC)   . Seizures (HCC)    "used to have little black-out szs where I'd drop out for 2-3 min then come back; nothing in the last 2-3-4years" (02/25/2018)  . Shingles     Patient Active Problem List   Diagnosis Date Noted  . Influenza A 10/11/2018  . MDD (major depressive disorder), severe (HCC) 10/06/2018  . Diarrhea due to protozoan   . Giardial enteritis   . Norovirus   . MDD (major depressive disorder) 02/26/2018  . Suicide attempt (HCC)   . Depression 11/08/2017  . Chest tightness   . Cough   . Leukocytosis   . SOB (shortness of breath)   . Nausea vomiting and  diarrhea   . Asthma exacerbation 08/15/2017  . Elevated LFTs 08/12/2017  . Cocaine abuse with cocaine-induced mood disorder (HCC) 03/30/2017  . Herpes zoster 03/06/2017  . Polysubstance abuse (HCC) 03/06/2017  . Homelessness 03/06/2017  . Schizoaffective disorder (HCC) 12/23/2016  . Suicidal ideation   . Cannabis use disorder, moderate, dependence (HCC) 07/27/2016  . Tobacco user 07/27/2016  . Intentional drug overdose (HCC) 07/18/2016  . Asthma 05/20/2007  . HIV (human immunodeficiency virus infection) (HCC) 05/05/2007    Past Surgical History:  Procedure Laterality Date  . DENTAL SURGERY     "had my eye teeth pulled down"        Home Medications    Prior to Admission medications   Medication Sig Start Date End Date Taking? Authorizing Provider  bictegravir-emtricitabine-tenofovir AF (BIKTARVY) 50-200-25 MG TABS tablet Take 1 tablet by mouth daily. Patient not taking: Reported on 10/05/2018 08/31/18   Judyann Munson, MD  busPIRone (BUSPAR) 5 MG tablet Take 1 tablet (5 mg total) by mouth 2 (two) times daily. For anxiety 10/12/18   Aldean Baker, NP  citalopram (CELEXA) 10 MG tablet Take 1 tablet (10 mg  total) by mouth daily. For mood 10/13/18   Aldean Baker, NP  gabapentin (NEURONTIN) 100 MG capsule Take 1 capsule (100 mg total) by mouth 3 (three) times daily. For anxiety/agitation 10/12/18   Aldean Baker, NP  ibuprofen (ADVIL,MOTRIN) 600 MG tablet Take 1 tablet (600 mg total) by mouth every 6 (six) hours as needed. 10/21/18   Jacalyn Lefevre, MD  loratadine (CLARITIN) 10 MG tablet Take 1 tablet (10 mg total) by mouth daily. 10/13/18   Aldean Baker, NP  nicotine (NICODERM CQ - DOSED IN MG/24 HOURS) 21 mg/24hr patch Place 1 patch (21 mg total) onto the skin daily. For tobacco cessation 10/12/18   Aldean Baker, NP  oseltamivir (TAMIFLU) 75 MG capsule Take 1 capsule (75 mg total) by mouth 2 (two) times daily. 10/12/18   Aldean Baker, NP  risperiDONE (RISPERDAL) 1 MG tablet Take 1  tablet (1 mg total) by mouth at bedtime. For hallucinations 10/12/18   Aldean Baker, NP  traZODone (DESYREL) 50 MG tablet Take 1 tablet (50 mg total) by mouth at bedtime as needed for sleep. 10/12/18   Aldean Baker, NP    Family History Family History  Problem Relation Age of Onset  . Huntington's disease Father   . Heart disease Mother   . Suicidality Maternal Uncle   . Suicidality Maternal Grandmother     Social History Social History   Tobacco Use  . Smoking status: Current Every Day Smoker    Packs/day: 0.50    Years: 20.00    Pack years: 10.00    Types: Cigarettes    Start date: 09/29/1991  . Smokeless tobacco: Never Used  Substance Use Topics  . Alcohol use: Yes    Alcohol/week: 4.0 standard drinks    Types: 4 Cans of beer per week    Comment: occasionally, "socially"  . Drug use: Yes    Types: Marijuana, Cocaine    Comment: 10/06/2018 "self medicating occassionally"     Allergies   Magnesium-containing compounds; Peanut-containing drug products; Esomeprazole magnesium; Atripla [efavirenz-emtricitab-tenofovir]; Atripla [efavirenz-emtricitab-tenofovir]; Bactrim [sulfamethoxazole-trimethoprim]; Magnesium-containing compounds; Nexium [esomeprazole magnesium]; Peanut-containing drug products; Penicillins; Bactrim [sulfamethoxazole-trimethoprim]; and Penicillins   Review of Systems Review of Systems  Musculoskeletal:       Right ankle pain  All other systems reviewed and are negative.    Physical Exam Updated Vital Signs BP 132/75 (BP Location: Right Arm)   Pulse 90   Temp (!) 97.5 F (36.4 C) (Oral)   Resp 20   SpO2 99%   Physical Exam Vitals signs and nursing note reviewed.  Constitutional:      Appearance: Normal appearance.  HENT:     Head: Normocephalic and atraumatic.     Right Ear: External ear normal.     Left Ear: External ear normal.     Nose: Nose normal.     Mouth/Throat:     Mouth: Mucous membranes are moist.  Eyes:     Extraocular  Movements: Extraocular movements intact.     Pupils: Pupils are equal, round, and reactive to light.  Neck:     Musculoskeletal: Normal range of motion and neck supple.  Cardiovascular:     Rate and Rhythm: Normal rate and regular rhythm.     Pulses: Normal pulses.     Heart sounds: Normal heart sounds.  Pulmonary:     Effort: Pulmonary effort is normal.  Abdominal:     General: Abdomen is flat.  Musculoskeletal:     Right ankle:  He exhibits swelling.  Skin:    General: Skin is warm.     Capillary Refill: Capillary refill takes less than 2 seconds.  Neurological:     General: No focal deficit present.     Mental Status: He is alert and oriented to person, place, and time.  Psychiatric:        Mood and Affect: Mood normal.      ED Treatments / Results  Labs (all labs ordered are listed, but only abnormal results are displayed) Labs Reviewed - No data to display  EKG None  Radiology Dg Ankle Complete Right  Result Date: 10/21/2018 CLINICAL DATA:  Acute anterior right ankle pain and swelling. No known injury. EXAM: RIGHT ANKLE - COMPLETE 3+ VIEW COMPARISON:  None. FINDINGS: Diffuse soft tissue swelling, most pronounced laterally and anteriorly. Corticated ossicles or old fracture fragments distal to the medial malleolus. Mild talotibial spur formation. No acute fracture or dislocation seen. No visible effusion. IMPRESSION: Soft tissue swelling and mild degenerative changes. No acute fracture. Electronically Signed   By: Beckie SaltsSteven  Reid M.D.   On: 10/21/2018 18:53    Procedures Procedures (including critical care time)  Medications Ordered in ED Medications - No data to display   Initial Impression / Assessment and Plan / ED Course  I have reviewed the triage vital signs and the nursing notes.  Pertinent labs & imaging results that were available during my care of the patient were reviewed by me and considered in my medical decision making (see chart for details).    No  evidence of redness or swelling.  I don't think it's an infected joint.  Pt will be placed in an ankle splint and told to f/u with ortho.  Return if worse.  Final Clinical Impressions(s) / ED Diagnoses   Final diagnoses:  Sprain of right ankle, unspecified ligament, initial encounter    ED Discharge Orders         Ordered    ibuprofen (ADVIL,MOTRIN) 600 MG tablet  Every 6 hours PRN     10/21/18 1859           Jacalyn LefevreHaviland, Rica Heather, MD 10/21/18 1900

## 2018-11-08 ENCOUNTER — Ambulatory Visit: Payer: Self-pay | Admitting: Internal Medicine

## 2018-11-11 ENCOUNTER — Other Ambulatory Visit: Payer: Self-pay

## 2018-11-11 DIAGNOSIS — B20 Human immunodeficiency virus [HIV] disease: Secondary | ICD-10-CM

## 2018-11-11 MED ORDER — BICTEGRAVIR-EMTRICITAB-TENOFOV 50-200-25 MG PO TABS: 1.0000 | ORAL_TABLET | Freq: Every day | ORAL | 0 refills | Status: DC

## 2018-11-23 ENCOUNTER — Encounter: Payer: Self-pay | Admitting: Internal Medicine

## 2018-11-29 ENCOUNTER — Other Ambulatory Visit: Payer: Self-pay

## 2018-11-29 ENCOUNTER — Other Ambulatory Visit: Payer: Self-pay | Admitting: *Deleted

## 2018-11-29 DIAGNOSIS — B20 Human immunodeficiency virus [HIV] disease: Secondary | ICD-10-CM

## 2018-12-06 ENCOUNTER — Other Ambulatory Visit: Payer: Self-pay | Admitting: Internal Medicine

## 2018-12-06 DIAGNOSIS — B20 Human immunodeficiency virus [HIV] disease: Secondary | ICD-10-CM

## 2018-12-13 ENCOUNTER — Ambulatory Visit: Payer: Self-pay | Admitting: Infectious Diseases

## 2018-12-14 ENCOUNTER — Emergency Department (HOSPITAL_COMMUNITY)
Admission: EM | Admit: 2018-12-14 | Discharge: 2018-12-14 | Disposition: A | Discharge status: Home / Self Care | Payer: Self-pay | Attending: Emergency Medicine | Admitting: Emergency Medicine

## 2018-12-14 ENCOUNTER — Encounter (HOSPITAL_COMMUNITY): Payer: Self-pay

## 2018-12-14 ENCOUNTER — Other Ambulatory Visit: Payer: Self-pay

## 2018-12-14 DIAGNOSIS — J069 Acute upper respiratory infection, unspecified: Secondary | ICD-10-CM | POA: Insufficient documentation

## 2018-12-14 DIAGNOSIS — Z79899 Other long term (current) drug therapy: Secondary | ICD-10-CM | POA: Insufficient documentation

## 2018-12-14 DIAGNOSIS — I10 Essential (primary) hypertension: Secondary | ICD-10-CM | POA: Insufficient documentation

## 2018-12-14 DIAGNOSIS — J45909 Unspecified asthma, uncomplicated: Secondary | ICD-10-CM | POA: Insufficient documentation

## 2018-12-14 DIAGNOSIS — F909 Attention-deficit hyperactivity disorder, unspecified type: Secondary | ICD-10-CM | POA: Insufficient documentation

## 2018-12-14 DIAGNOSIS — B9789 Other viral agents as the cause of diseases classified elsewhere: Secondary | ICD-10-CM

## 2018-12-14 DIAGNOSIS — J988 Other specified respiratory disorders: Secondary | ICD-10-CM

## 2018-12-14 DIAGNOSIS — F17210 Nicotine dependence, cigarettes, uncomplicated: Secondary | ICD-10-CM | POA: Insufficient documentation

## 2018-12-14 MED ORDER — ALBUTEROL SULFATE HFA 108 (90 BASE) MCG/ACT IN AERS
1.0000 | INHALATION_SPRAY | Freq: Once | RESPIRATORY_TRACT | Status: AC
  Administered 2018-12-14: 2 via RESPIRATORY_TRACT
  Filled 2018-12-14: qty 6.7

## 2018-12-14 MED ORDER — DEXAMETHASONE 4 MG PO TABS
10.0000 mg | ORAL_TABLET | Freq: Once | ORAL | Status: AC
  Administered 2018-12-14: 10 mg via ORAL
  Filled 2018-12-14: qty 3

## 2018-12-14 NOTE — ED Provider Notes (Addendum)
[facility] EMERGENCY DEPARTMENT Provider Note   CSN: [id number] Arrival date & time: 12/14/18  1053    History   Chief Complaint Chief Complaint  Patient presents with  . FLU LIKE SYMPTOMS    HPI Edwin Martinez is a 33 y.o. male.     HPI    33 year old male presents today with complaints of respiratory infection.  Patient notes over the last 2 days he has had body aches, cough, rhinorrhea and nasal congestion.  Patient notes that he is currently homeless at this time.  He denies any close sick contacts.  Denies any recent travel.  He notes he did receive the influenza vaccine.  Denies any significant shortness of breath but notes productive cough.  He notes feeling hot but denies any true fevers.  No medications prior to arrival.  Patient notes a significant past medical history of asthma but has not had an inhaler recently.  No a history of HIV and notes he is compliant with his medications.    Past Medical History:  Diagnosis Date  . ADD (attention deficit disorder)   . Anxiety   . Asthma   . Bipolar 1 disorder (HCC)   . Depression   . HIV (human immunodeficiency virus infection) (HCC) dx'd 2008  . Hypertension   . Insomnia   . Intentional drug overdose (HCC)    Edwin Martinez 02/25/2018  . Polysubstance abuse (HCC)    Edwin Martinez 02/25/2018  . Schizophrenia (HCC)   . Seizures (HCC)    "used to have little black-out szs where I'd drop out for 2-3 min then come back; nothing in the last 2-3-4years" (02/25/2018)  . Shingles     Patient Active Problem List   Diagnosis Date Noted  . Influenza A 10/11/2018  . MDD (major depressive disorder), severe (HCC) 10/06/2018  . Diarrhea due to protozoan   . Giardial enteritis   . Norovirus   . MDD (major depressive disorder) 02/26/2018  . Suicide attempt (HCC)   . Depression 11/08/2017  . Chest tightness   . Cough   . Leukocytosis   . SOB (shortness of breath)   . Nausea vomiting and diarrhea   . Asthma  exacerbation 08/15/2017  . Elevated LFTs 08/12/2017  . Cocaine abuse with cocaine-induced mood disorder (HCC) 03/30/2017  . Herpes zoster 03/06/2017  . Polysubstance abuse (HCC) 03/06/2017  . Homelessness 03/06/2017  . Schizoaffective disorder (HCC) 12/23/2016  . Suicidal ideation   . Cannabis use disorder, moderate, dependence (HCC) 07/27/2016  . Tobacco user 07/27/2016  . Intentional drug overdose (HCC) 07/18/2016  . Asthma 05/20/2007  . HIV (human immunodeficiency virus infection) (HCC) 05/05/2007    Past Surgical History:  Procedure Laterality Date  . DENTAL SURGERY     "had my eye teeth pulled down"       Home Medications    Prior to Admission medications   Medication Sig Start Date End Date Taking? Authorizing Provider  BIKTARVY 50-200-25 MG TABS tablet TAKE 1 TABLET BY MOUTH DAILY 12/06/18   Judyann Munson, MD  busPIRone (BUSPAR) 5 MG tablet Take 1 tablet (5 mg total) by mouth 2 (two) times daily. For anxiety 10/12/18   Aldean Baker, NP  citalopram (CELEXA) 10 MG tablet Take 1 tablet (10 mg total) by mouth daily. For mood 10/13/18   Aldean Baker, NP  gabapentin (NEURONTIN) 100 MG capsule Take 1 capsule (100 mg total) by mouth 3 (three) times daily. For anxiety/agitation 10/12/18   Aldean Baker, NP  ibuprofen (ADVIL,MOTRIN) 600 MG tablet Take 1 tablet (600 mg total) by mouth every 6 (six) hours as needed. 10/21/18   Jacalyn Lefevre, MD  loratadine (CLARITIN) 10 MG tablet Take 1 tablet (10 mg total) by mouth daily. 10/13/18   Aldean Baker, NP  nicotine (NICODERM CQ - DOSED IN MG/24 HOURS) 21 mg/24hr patch Place 1 patch (21 mg total) onto the skin daily. For tobacco cessation 10/12/18   Aldean Baker, NP  oseltamivir (TAMIFLU) 75 MG capsule Take 1 capsule (75 mg total) by mouth 2 (two) times daily. 10/12/18   Aldean Baker, NP  risperiDONE (RISPERDAL) 1 MG tablet Take 1 tablet (1 mg total) by mouth at bedtime. For hallucinations 10/12/18   Aldean Baker, NP  traZODone  (DESYREL) 50 MG tablet Take 1 tablet (50 mg total) by mouth at bedtime as needed for sleep. 10/12/18   Aldean Baker, NP    Family History Family History  Problem Relation Age of Onset  . Huntington's disease Father   . Heart disease Mother   . Suicidality Maternal Uncle   . Suicidality Maternal Grandmother     Social History Social History   Tobacco Use  . Smoking status: Current Every Day Smoker    Packs/day: 0.50    Years: 20.00    Pack years: 10.00    Types: Cigarettes    Start date: 09/29/1991  . Smokeless tobacco: Never Used  Substance Use Topics  . Alcohol use: Yes    Alcohol/week: 4.0 standard drinks    Types: 4 Cans of beer per week    Comment: occasionally, "socially"  . Drug use: Yes    Types: Marijuana, Cocaine    Comment: 10/06/2018 "self medicating occassionally"     Allergies   Magnesium-containing compounds; Peanut-containing drug products; Esomeprazole magnesium; Atripla [efavirenz-emtricitab-tenofovir]; Atripla [efavirenz-emtricitab-tenofovir]; Bactrim [sulfamethoxazole-trimethoprim]; Magnesium-containing compounds; Nexium [esomeprazole magnesium]; Peanut-containing drug products; Penicillins; Bactrim [sulfamethoxazole-trimethoprim]; and Penicillins   Review of Systems Review of Systems  All other systems reviewed and are negative.    Physical Exam Updated Vital Signs BP 121/79 (BP Location: Right Arm)   Pulse 100   Temp 99.2 F (37.3 C) (Oral)   Resp (!) 26   Ht 5\' 7"  (1.702 m)   Wt 72.6 kg   SpO2 92%   BMI 25.06 kg/m   Physical Exam Vitals signs and nursing note reviewed.  Constitutional:      Appearance: He is well-developed.  HENT:     Head: Normocephalic and atraumatic.  Eyes:     General: No scleral icterus.       Right eye: No discharge.        Left eye: No discharge.     Conjunctiva/sclera: Conjunctivae normal.     Pupils: Pupils are equal, round, and reactive to light.  Neck:     Musculoskeletal: Normal range of motion.      Vascular: No JVD.     Trachea: No tracheal deviation.  Pulmonary:     Effort: Pulmonary effort is normal.     Breath sounds: No stridor.     Comments: Bilateral expiratory wheeze noted-no crackles. Neurological:     Mental Status: He is alert and oriented to person, place, and time.     Coordination: Coordination normal.  Psychiatric:        Behavior: Behavior normal.        Thought Content: Thought content normal.        Judgment: Judgment normal.      ED  Treatments / Results  Labs (all labs ordered are listed, but only abnormal results are displayed) Labs Reviewed - No data to display  EKG None  Radiology No results found.  Procedures Procedures (including critical care time)  Medications Ordered in ED Medications  dexamethasone (DECADRON) tablet 10 mg (10 mg Oral Given 12/14/18 1215)  albuterol (PROVENTIL HFA;VENTOLIN HFA) 108 (90 Base) MCG/ACT inhaler 1-2 puff (2 puffs Inhalation Given 12/14/18 1215)     Initial Impression / Assessment and Plan / ED Course  I have reviewed the triage vital signs and the nursing notes.  Pertinent labs & imaging results that were available during my care of the patient were reviewed by me and considered in my medical decision making (see chart for details).        Labs:   Imaging:  Consults:  Therapeutics: Decadron, albuterol  Discharge Meds:   Assessment/Plan: 34 YOF presents today with complaints of respiratory infection.  Patient has productive cough, he is afebrile, he does have wheezing on exam that improved with Toradol here.  Patient has no signs of bacterial infection at this time, low suspicion for influenza, coronavirus as he is low risk.  Patient is stable for outpatient management.  He was given a dose of Decadron here discharged with an inhaler and hand and strict return precautions.  He verbalized understanding and agreement to today's plan had no further questions or concerns at the time of discharge.   Final  Clinical Impressions(s) / ED Diagnoses   Final diagnoses:  Viral respiratory infection    ED Discharge Orders    None     Eyvonne Mechanic, PA-C 12/14/18 1404    Erla Bacchi, Tinnie Gens, PA-C 12/14/18 1406    Derwood Kaplan, MD 12/14/18 1417

## 2018-12-14 NOTE — ED Notes (Signed)
Patient verbalizes understanding of discharge instructions. Opportunity for questioning and answers were provided. Armband removed by staff, pt discharged from ED ambulatory by self\  

## 2018-12-14 NOTE — ED Triage Notes (Signed)
Pt BIB [facility] for eval of cough/congestion/chills x 2 days. Pt w/ obvious productive cough on triage. Pt denies know sick contacts, pt is currently homeless at this time.

## 2018-12-14 NOTE — Discharge Instructions (Addendum)
Please read attached information. If you experience any new or worsening signs or symptoms please return to the emergency room for evaluation. Please follow-up with your primary care provider or specialist as discussed. Please use medication prescribed only as directed and discontinue taking if you have any concerning signs or symptoms.   °

## 2018-12-25 ENCOUNTER — Other Ambulatory Visit: Payer: Self-pay | Admitting: Internal Medicine

## 2018-12-27 ENCOUNTER — Encounter (HOSPITAL_COMMUNITY): Payer: Self-pay | Admitting: Emergency Medicine

## 2018-12-27 ENCOUNTER — Encounter (HOSPITAL_COMMUNITY): Payer: Self-pay

## 2018-12-27 ENCOUNTER — Emergency Department (HOSPITAL_COMMUNITY): Payer: Self-pay

## 2018-12-27 ENCOUNTER — Inpatient Hospital Stay (HOSPITAL_COMMUNITY)
Admission: AD | Admit: 2018-12-27 | Discharge: 2018-12-30 | DRG: 885 | Disposition: A | Discharge status: Home / Self Care | Payer: No Typology Code available for payment source | Source: Intra-hospital | Attending: Psychiatry | Admitting: Psychiatry

## 2018-12-27 ENCOUNTER — Other Ambulatory Visit: Payer: Self-pay

## 2018-12-27 ENCOUNTER — Other Ambulatory Visit: Payer: Self-pay | Admitting: Registered Nurse

## 2018-12-27 ENCOUNTER — Emergency Department (HOSPITAL_COMMUNITY)
Admission: EM | Admit: 2018-12-27 | Discharge: 2018-12-27 | Disposition: A | Discharge status: Other Acute Inpatient Hospital | Payer: Self-pay | Attending: Emergency Medicine | Admitting: Emergency Medicine

## 2018-12-27 DIAGNOSIS — Z79899 Other long term (current) drug therapy: Secondary | ICD-10-CM | POA: Insufficient documentation

## 2018-12-27 DIAGNOSIS — F17210 Nicotine dependence, cigarettes, uncomplicated: Secondary | ICD-10-CM | POA: Insufficient documentation

## 2018-12-27 DIAGNOSIS — F333 Major depressive disorder, recurrent, severe with psychotic symptoms: Secondary | ICD-10-CM | POA: Insufficient documentation

## 2018-12-27 DIAGNOSIS — Z880 Allergy status to penicillin: Secondary | ICD-10-CM | POA: Diagnosis not present

## 2018-12-27 DIAGNOSIS — Z915 Personal history of self-harm: Secondary | ICD-10-CM

## 2018-12-27 DIAGNOSIS — Z590 Homelessness: Secondary | ICD-10-CM | POA: Insufficient documentation

## 2018-12-27 DIAGNOSIS — I10 Essential (primary) hypertension: Secondary | ICD-10-CM | POA: Diagnosis present

## 2018-12-27 DIAGNOSIS — Z888 Allergy status to other drugs, medicaments and biological substances status: Secondary | ICD-10-CM | POA: Diagnosis not present

## 2018-12-27 DIAGNOSIS — Z91010 Allergy to peanuts: Secondary | ICD-10-CM

## 2018-12-27 DIAGNOSIS — F1910 Other psychoactive substance abuse, uncomplicated: Secondary | ICD-10-CM | POA: Diagnosis not present

## 2018-12-27 DIAGNOSIS — F1310 Sedative, hypnotic or anxiolytic abuse, uncomplicated: Secondary | ICD-10-CM | POA: Diagnosis present

## 2018-12-27 DIAGNOSIS — B20 Human immunodeficiency virus [HIV] disease: Secondary | ICD-10-CM | POA: Diagnosis present

## 2018-12-27 DIAGNOSIS — F1010 Alcohol abuse, uncomplicated: Secondary | ICD-10-CM | POA: Diagnosis present

## 2018-12-27 DIAGNOSIS — J45909 Unspecified asthma, uncomplicated: Secondary | ICD-10-CM | POA: Insufficient documentation

## 2018-12-27 DIAGNOSIS — R45851 Suicidal ideations: Secondary | ICD-10-CM | POA: Diagnosis present

## 2018-12-27 DIAGNOSIS — F1420 Cocaine dependence, uncomplicated: Secondary | ICD-10-CM | POA: Diagnosis not present

## 2018-12-27 DIAGNOSIS — F988 Other specified behavioral and emotional disorders with onset usually occurring in childhood and adolescence: Secondary | ICD-10-CM | POA: Diagnosis present

## 2018-12-27 DIAGNOSIS — Y901 Blood alcohol level of 20-39 mg/100 ml: Secondary | ICD-10-CM | POA: Diagnosis present

## 2018-12-27 DIAGNOSIS — Z882 Allergy status to sulfonamides status: Secondary | ICD-10-CM | POA: Diagnosis not present

## 2018-12-27 DIAGNOSIS — F322 Major depressive disorder, single episode, severe without psychotic features: Secondary | ICD-10-CM | POA: Diagnosis not present

## 2018-12-27 DIAGNOSIS — Z634 Disappearance and death of family member: Secondary | ICD-10-CM | POA: Insufficient documentation

## 2018-12-27 DIAGNOSIS — G4700 Insomnia, unspecified: Secondary | ICD-10-CM | POA: Diagnosis present

## 2018-12-27 DIAGNOSIS — R0981 Nasal congestion: Secondary | ICD-10-CM | POA: Insufficient documentation

## 2018-12-27 DIAGNOSIS — F419 Anxiety disorder, unspecified: Secondary | ICD-10-CM | POA: Diagnosis present

## 2018-12-27 LAB — COMPREHENSIVE METABOLIC PANEL
ALT: 81 U/L — ABNORMAL HIGH (ref 0–44)
AST: 102 U/L — ABNORMAL HIGH (ref 15–41)
Albumin: 3.9 g/dL (ref 3.5–5.0)
Alkaline Phosphatase: 76 U/L (ref 38–126)
Anion gap: 7 (ref 5–15)
BILIRUBIN TOTAL: 0.4 mg/dL (ref 0.3–1.2)
BUN: 11 mg/dL (ref 6–20)
CO2: 25 mmol/L (ref 22–32)
Calcium: 8.8 mg/dL — ABNORMAL LOW (ref 8.9–10.3)
Chloride: 105 mmol/L (ref 98–111)
Creatinine, Ser: 0.85 mg/dL (ref 0.61–1.24)
GFR calc Af Amer: >60 mL/min (ref >60)
Glucose, Bld: 67 mg/dL — ABNORMAL LOW (ref 70–99)
Potassium: 3.9 mmol/L (ref 3.5–5.1)
Sodium: 137 mmol/L (ref 135–145)
Total Protein: 7.6 g/dL (ref 6.5–8.1)

## 2018-12-27 LAB — CBC WITH DIFFERENTIAL/PLATELET
Abs Immature Granulocytes: 0.04 10*3/uL (ref 0.00–0.07)
Basophils Absolute: 0.1 10*3/uL (ref 0.0–0.1)
Basophils Relative: 1 %
Eosinophils Absolute: 0.2 10*3/uL (ref 0.0–0.5)
Eosinophils Relative: 2 %
HCT: 47.1 % (ref 39.0–52.0)
Hemoglobin: 15 g/dL (ref 13.0–17.0)
Immature Granulocytes: 0 %
Lymphocytes Relative: 42 %
Lymphs Abs: 4 10*3/uL (ref 0.7–4.0)
MCH: 27.3 pg (ref 26.0–34.0)
MCHC: 31.8 g/dL (ref 30.0–36.0)
MCV: 85.8 fL (ref 80.0–100.0)
MONO ABS: 0.5 10*3/uL (ref 0.1–1.0)
Monocytes Relative: 6 %
Neutro Abs: 4.6 10*3/uL (ref 1.7–7.7)
Neutrophils Relative %: 49 %
Platelets: 355 10*3/uL (ref 150–400)
RBC: 5.49 MIL/uL (ref 4.22–5.81)
RDW: 12.8 % (ref 11.5–15.5)
WBC: 9.5 10*3/uL (ref 4.0–10.5)
nRBC: 0 % (ref 0.0–0.2)

## 2018-12-27 LAB — RAPID URINE DRUG SCREEN, HOSP PERFORMED
AMPHETAMINES: NOT DETECTED
BENZODIAZEPINES: POSITIVE — AB
Barbiturates: NOT DETECTED
Cocaine: POSITIVE — AB
Opiates: NOT DETECTED
TETRAHYDROCANNABINOL: NOT DETECTED

## 2018-12-27 LAB — ACETAMINOPHEN LEVEL: Acetaminophen (Tylenol), Serum: <10 ug/mL — ABNORMAL LOW (ref 10–30)

## 2018-12-27 LAB — ETHANOL: Alcohol, Ethyl (B): 29 mg/dL — ABNORMAL HIGH (ref <10)

## 2018-12-27 LAB — CBG MONITORING, ED
Glucose-Capillary: 69 mg/dL — ABNORMAL LOW (ref 70–99)
Glucose-Capillary: 143 mg/dL — ABNORMAL HIGH (ref 70–99)

## 2018-12-27 LAB — SALICYLATE LEVEL: Salicylate Lvl: <7 mg/dL (ref 2.8–30.0)

## 2018-12-27 MED ORDER — BUSPIRONE HCL 10 MG PO TABS
5.0000 mg | ORAL_TABLET | Freq: Two times a day (BID) | ORAL | Status: DC
  Administered 2018-12-27 (×2): 5 mg via ORAL
  Filled 2018-12-27 (×2): qty 1

## 2018-12-27 MED ORDER — METHOCARBAMOL 500 MG PO TABS: 500.0000 mg | ORAL_TABLET | Freq: Three times a day (TID) | ORAL | Status: DC | PRN

## 2018-12-27 MED ORDER — HYDROXYZINE HCL 25 MG PO TABS
25.0000 mg | ORAL_TABLET | Freq: Four times a day (QID) | ORAL | Status: DC | PRN
  Filled 2018-12-27: qty 1

## 2018-12-27 MED ORDER — RISPERIDONE 1 MG PO TABS
1.0000 mg | ORAL_TABLET | Freq: Every day | ORAL | Status: DC
  Administered 2018-12-27: 1 mg via ORAL
  Filled 2018-12-27: qty 1

## 2018-12-27 MED ORDER — NICOTINE 21 MG/24HR TD PT24
21.0000 mg | MEDICATED_PATCH | Freq: Every day | TRANSDERMAL | Status: DC
  Administered 2018-12-27: 21 mg via TRANSDERMAL
  Filled 2018-12-27: qty 1

## 2018-12-27 MED ORDER — CITALOPRAM HYDROBROMIDE 10 MG PO TABS
10.0000 mg | ORAL_TABLET | Freq: Every day | ORAL | Status: DC
  Administered 2018-12-27: 10 mg via ORAL
  Filled 2018-12-27: qty 1

## 2018-12-27 MED ORDER — LOPERAMIDE HCL 2 MG PO CAPS: 2.0000 mg | ORAL_CAPSULE | ORAL | Status: DC | PRN

## 2018-12-27 MED ORDER — LORAZEPAM 2 MG/ML IJ SOLN: 1.0000 mg | Freq: Four times a day (QID) | INTRAMUSCULAR | Status: DC | PRN

## 2018-12-27 MED ORDER — TRAZODONE HCL 50 MG PO TABS
50.0000 mg | ORAL_TABLET | Freq: Every evening | ORAL | Status: DC | PRN
  Administered 2018-12-29: 50 mg via ORAL
  Filled 2018-12-27 (×8): qty 1

## 2018-12-27 MED ORDER — CLONIDINE HCL 0.1 MG PO TABS
0.1000 mg | ORAL_TABLET | ORAL | Status: DC
  Filled 2018-12-27 (×3): qty 1

## 2018-12-27 MED ORDER — BICTEGRAVIR-EMTRICITAB-TENOFOV 50-200-25 MG PO TABS
1.0000 | ORAL_TABLET | Freq: Every day | ORAL | Status: DC
  Administered 2018-12-27: 1 via ORAL
  Filled 2018-12-27: qty 1

## 2018-12-27 MED ORDER — GABAPENTIN 100 MG PO CAPS
100.0000 mg | ORAL_CAPSULE | Freq: Three times a day (TID) | ORAL | Status: DC
  Administered 2018-12-27 (×2): 100 mg via ORAL
  Filled 2018-12-27 (×2): qty 1

## 2018-12-27 MED ORDER — BOOST / RESOURCE BREEZE PO LIQD CUSTOM
1.0000 | Freq: Three times a day (TID) | ORAL | Status: DC
  Administered 2018-12-28 – 2018-12-29 (×5): 1 via ORAL
  Filled 2018-12-27 (×10): qty 1

## 2018-12-27 MED ORDER — DICYCLOMINE HCL 20 MG PO TABS: 20.0000 mg | ORAL_TABLET | Freq: Four times a day (QID) | ORAL | Status: DC | PRN

## 2018-12-27 MED ORDER — NAPROXEN 500 MG PO TABS
500.0000 mg | ORAL_TABLET | Freq: Two times a day (BID) | ORAL | Status: DC | PRN
  Administered 2018-12-29: 500 mg via ORAL
  Filled 2018-12-27: qty 1

## 2018-12-27 MED ORDER — LORATADINE 10 MG PO TABS
10.0000 mg | ORAL_TABLET | Freq: Every day | ORAL | Status: DC
  Administered 2018-12-27: 10 mg via ORAL
  Filled 2018-12-27: qty 1

## 2018-12-27 MED ORDER — CLONIDINE HCL 0.1 MG PO TABS
0.1000 mg | ORAL_TABLET | Freq: Four times a day (QID) | ORAL | Status: AC
  Administered 2018-12-28 – 2018-12-29 (×6): 0.1 mg via ORAL
  Filled 2018-12-27 (×9): qty 1

## 2018-12-27 MED ORDER — TRAZODONE HCL 50 MG PO TABS: 50.0000 mg | ORAL_TABLET | Freq: Every evening | ORAL | Status: DC | PRN

## 2018-12-27 MED ORDER — CLONIDINE HCL 0.1 MG PO TABS: 0.1000 mg | ORAL_TABLET | Freq: Every day | ORAL | Status: DC

## 2018-12-27 MED ORDER — ONDANSETRON 4 MG PO TBDP: 4.0000 mg | ORAL_TABLET | Freq: Four times a day (QID) | ORAL | Status: DC | PRN

## 2018-12-27 NOTE — ED Notes (Signed)
Pt arrived to Rm 50 via stretcher. Pt ambulated onto bed. Staffing Office aware pt is now in Rm 50 - will notify Sitter.

## 2018-12-27 NOTE — ED Notes (Signed)
Pt resting at this time. Food and fluids placed at bedside.

## 2018-12-27 NOTE — ED Notes (Addendum)
Pt in purple scrubs, pt wanded by security, two bags inventoried, valuables with security. Pt eating meal. Pt ambulatory to bathroom to provide urine sample. Pt signed medical clearance policy.

## 2018-12-27 NOTE — ED Notes (Signed)
ED Provider at bedside. 
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
## 2018-12-27 NOTE — ED Notes (Signed)
TTS being performed.  

## 2018-12-27 NOTE — ED Triage Notes (Signed)
Patient arrived reporting Suicide Ideation, he hears voiced, he states "I dont have bi-polar, I have chronic depression, just depressed all the time, I've been self-medicating and I need help with that." Reports his "first cousin just got murdered and all parents dead." He states "I'm also home homeless, I tired" Reports uncle and grandmother commited suicide.

## 2018-12-27 NOTE — Progress Notes (Signed)
Pt accepted to [facility]; bed 306-1 Shuvon Rankin, NP is the accepting provider.   Dr. Jama Flavors is the attending provider.   Call report to [phone]   [facility] notified.    Pt is voluntary and will be transported by [facility].   Pt is scheduled to arrive at [facility] at 8pm.   Wells Guiles, LCSW, LCAS Disposition CSW [facility]/TTS [phone]

## 2018-12-27 NOTE — ED Notes (Signed)
Sitter has arrived to bedside.  

## 2018-12-27 NOTE — ED Notes (Signed)
Pt noted to be lying on bed w/eyes closed. Respirations even, unlabored - snorus respirations noted.

## 2018-12-27 NOTE — ED Notes (Signed)
Patient belongings given to [facility] transport - (2) belongings bags (1) security items bag.

## 2018-12-27 NOTE — BH Assessment (Addendum)
Tele Assessment Note   Patient Name: Edwin Martinez MRN: [id number] Referring Physician: Adela Lank Location of Patient: [facility] ED Location of Provider: Behavioral Health TTS Department  Edwin Martinez is an 33 y.o. male.  The pt came in due to suicidal thoughts.  The pt stated he is having thoughts to walk into traffic.  The pt's last suicide attempt was 09/2018 and he overdosed on pills at that time.  He has had several suicide attempts in the past.  His grandmother and uncle died by suicide.  The pt stated he is stressed because his first cousin was murdered recently and being homeless.  The pt is using about 20-30 dollars worth of crack cocaine about every other day.  He last used yesterday.  The pt isn't seeing a counselor currently and hasn't seen one in several years.  He has had several hospitalizations and was last hospitalized at [facility] January 2020.  The pt is currently homeless and is sleeping on the streets.  He denies self harm, HI, legal issues and history of abuse.  He denies hallucinations, but stated he feels that people are out to get him.  The pt stated he is sleeping about 2-3 hours a night and has a good appetite.  The pt reports feeling hopeless, having problems concentrating, and crying spells.  The pt's UDS is positive for cocaine and benzodiazapine.  The pt's longest period of sobriety was a year and 4 months, when he was in jail.  Pt is dressed in scrubs. He is drowsy and oriented x4. Pt speaks in a clear tone, at moderate volume and normal pace. Eye contact is good. Pt's mood is depressed. Thought process is coherent and relevant. There is no indication Pt is currently responding to internal stimuli or experiencing delusional thought content.?Pt was cooperative throughout assessment.     Diagnosis: F33.3 Major depressive disorder, Recurrent episode, With psychotic features  F14.20 Cocaine use disorder, Severe  Past Medical History:  Past Medical History:   Diagnosis Date  . ADD (attention deficit disorder)   . Anxiety   . Asthma   . Bipolar 1 disorder (HCC)   . Depression   . HIV (human immunodeficiency virus infection) (HCC) dx'd 2008  . Hypertension   . Insomnia   . Intentional drug overdose (HCC)    [name] 02/25/2018  . Polysubstance abuse (HCC)    [name] 02/25/2018  . Schizophrenia (HCC)   . Seizures (HCC)    "used to have little black-out szs where I'd drop out for 2-3 min then come back; nothing in the last 2-3-4years" (02/25/2018)  . Shingles     Past Surgical History:  Procedure Laterality Date  . DENTAL SURGERY     "had my eye teeth pulled down"    Family History:  Family History  Problem Relation Age of Onset  . Huntington's disease Father   . Heart disease Mother   . Suicidality Maternal Uncle   . Suicidality Maternal Grandmother     Social History:  reports that he has been smoking cigarettes. He started smoking about 27 years ago. He has a 10.00 pack-year smoking history. He has never used smokeless tobacco. He reports current alcohol use of about 4.0 standard drinks of alcohol per week. He reports current drug use. Drugs: Marijuana and Cocaine.  Additional Social History:  Alcohol / Drug Use Pain Medications: See MAR Prescriptions: See MAR Over the Counter: See MAR History of alcohol / drug use?: Yes Longest period of sobriety (when/how long):  year and 4 months when in jail Substance #1 Name of Substance 1: crack cocaine 1 - Age of First Use: 15 1 - Amount (size/oz): 20-30 dollars worth 1 - Frequency: every other day 1 - Last Use / Amount: 12/26/2018  CIWA: CIWA-Ar BP: 122/77 Pulse Rate: (!) 52 COWS:    Allergies:  Allergies  Allergen Reactions  . Magnesium-Containing Compounds Other (See Comments)    This medication is contraindicated with pts HIV meds.    . Peanut-Containing Drug Products Anaphylaxis  . Esomeprazole Magnesium Cough  . Atripla [Efavirenz-Emtricitab-Tenofovir] Other (See  Comments)    Reaction:  Suicidal thoughts   . Atripla [Efavirenz-Emtricitab-Tenofovir]   . Bactrim [Sulfamethoxazole-Trimethoprim]   . Magnesium-Containing Compounds   . Nexium [Esomeprazole Magnesium]   . Peanut-Containing Drug Products   . Penicillins   . Bactrim [Sulfamethoxazole-Trimethoprim] Rash  . Penicillins Rash and Other (See Comments)    Has patient had a PCN reaction causing immediate rash, facial/tongue/throat swelling, SOB or lightheadedness with hypotension: Yes Has patient had a PCN reaction causing severe rash involving mucus membranes or skin necrosis: No Has patient had a PCN reaction that required hospitalization No Has patient had a PCN reaction occurring within the last 10 years: No If all of the above answers are "NO", then may proceed with Cephalosporin use.    Home Medications: (Not in a hospital admission)   OB/GYN Status:  No LMP for male patient.  General Assessment Data Location of Assessment: [facility] ED TTS Assessment: In system Is this a Tele or Face-to-Face Assessment?: Face-to-Face Is this an Initial Assessment or a Re-assessment for this encounter?: Initial Assessment Patient Accompanied by:: N/A Language Other than English: No Living Arrangements: Homeless/Shelter What gender do you identify as?: Male Marital status: Single Living Arrangements: Other (Comment)(homeless) Can pt return to current living arrangement?: Yes Admission Status: Voluntary Is patient capable of signing voluntary admission?: Yes Referral Source: Self/Family/Friend Insurance type: Self Pay     Crisis Care Plan Living Arrangements: Other (Comment)(homeless) Legal Guardian: Other:(self) Name of Psychiatrist: none Name of Therapist: none  Education Status Is patient currently in school?: No Is the patient employed, unemployed or receiving disability?: Unemployed  Risk to self with the past 6 months Suicidal Ideation: Yes-Currently Present Has patient been a risk to  self within the past 6 months prior to admission? : Yes Suicidal Intent: Yes-Currently Present Has patient had any suicidal intent within the past 6 months prior to admission? : Yes Is patient at risk for suicide?: Yes Suicidal Plan?: Yes-Currently Present Has patient had any suicidal plan within the past 6 months prior to admission? : Yes Specify Current Suicidal Plan: walk into traffic Access to Means: Yes Specify Access to Suicidal Means: can get to traffic What has been your use of drugs/alcohol within the last 12 months?: crack cocaine use Previous Attempts/Gestures: Yes How many times?: 5 Other Self Harm Risks: none Triggers for Past Attempts: Unpredictable Intentional Self Injurious Behavior: None Family Suicide History: Yes Recent stressful life event(s): Loss (Comment)(first cousin murdered) Persecutory voices/beliefs?: Yes Depression: Yes Depression Symptoms: Despondent, Insomnia, Tearfulness, Feeling worthless/self pity Substance abuse history and/or treatment for substance abuse?: Yes Suicide prevention information given to non-admitted patients: Not applicable  Risk to Others within the past 6 months Homicidal Ideation: No Does patient have any lifetime risk of violence toward others beyond the six months prior to admission? : No Thoughts of Harm to Others: No Current Homicidal Intent: No Current Homicidal Plan: No Access to Homicidal Means: No Identified  Victim: none History of harm to others?: No Assessment of Violence: None Noted Violent Behavior Description: none Does patient have access to weapons?: No Criminal Charges Pending?: No Does patient have a court date: No Is patient on probation?: No  Psychosis Hallucinations: None noted Delusions: Persecutory  Mental Status Report Appearance/Hygiene: Unremarkable, In scrubs Eye Contact: Fair Motor Activity: Freedom of movement, Unremarkable Speech: Logical/coherent Level of Consciousness: Drowsy Mood:  Depressed Affect: Depressed Anxiety Level: None Thought Processes: Coherent, Relevant Judgement: Impaired Orientation: Person, Place, Time, Situation Obsessive Compulsive Thoughts/Behaviors: None  Cognitive Functioning Concentration: Normal Memory: Recent Intact, Remote Intact Is patient IDD: No Insight: Poor Impulse Control: Poor Appetite: Good Have you had any weight changes? : No Change Sleep: Decreased Total Hours of Sleep: 3 Vegetative Symptoms: None  ADLScreening [facility] Assessment Services) Patient's cognitive ability adequate to safely complete daily activities?: Yes Patient able to express need for assistance with ADLs?: Yes Independently performs ADLs?: Yes (appropriate for developmental age)  Prior Inpatient Therapy Prior Inpatient Therapy: Yes Prior Therapy Dates: 09/2018, 02/2018 Prior Therapy Facilty/Provider(s): [facility] Reason for Treatment: Si and SA  Prior Outpatient Therapy Prior Outpatient Therapy: No Does patient have an ACCT team?: No Does patient have Intensive In-House Services?  : No Does patient have [name] services? : No Does patient have P4CC services?: No  ADL Screening (condition at time of admission) Patient's cognitive ability adequate to safely complete daily activities?: Yes Patient able to express need for assistance with ADLs?: Yes Independently performs ADLs?: Yes (appropriate for developmental age)       Abuse/Neglect Assessment (Assessment to be complete while patient is alone) Abuse/Neglect Assessment Can Be Completed: Yes Physical Abuse: Denies Verbal Abuse: Denies Sexual Abuse: Denies Exploitation of patient/patient's resources: Denies Self-Neglect: Denies Values / Beliefs Cultural Requests During Hospitalization: None Spiritual Requests During Hospitalization: None Consults Spiritual Care Consult Needed: No Social Work Consult Needed: No Merchant navy officer (For Healthcare) Does Patient Have a Medical Advance  Directive?: No Would patient like information on creating a medical advance directive?: No - Patient declined          Disposition:  Disposition Initial Assessment Completed for this Encounter: Yes  NP Shuvon Rankin recommends inpatient treatment.  RN and MD were made aware of the recommendations.  This service was provided via telemedicine using a 2-way, interactive audio and video technology.  Names of all persons participating in this telemedicine service and their role in this encounter. Name: Method Sater Role: Pt  Name:  Role:   Name:  Role:   Name:  Role:     Ottis Stain 12/27/2018 3:48 PM

## 2018-12-27 NOTE — ED Notes (Signed)
Pt given [place] sandwich and drink. Security to wand pt

## 2018-12-27 NOTE — ED Notes (Signed)
Belongings noted to be in [place] #2 - 2 labeled belongings bags - and 1 valuables envelope to Security.

## 2018-12-27 NOTE — ED Notes (Signed)
Patient signed electronic consent form for transfer and paper consent form faxed to [facility].

## 2018-12-28 DIAGNOSIS — F17210 Nicotine dependence, cigarettes, uncomplicated: Secondary | ICD-10-CM

## 2018-12-28 DIAGNOSIS — F322 Major depressive disorder, single episode, severe without psychotic features: Principal | ICD-10-CM

## 2018-12-28 DIAGNOSIS — Z590 Homelessness: Secondary | ICD-10-CM

## 2018-12-28 DIAGNOSIS — F1910 Other psychoactive substance abuse, uncomplicated: Secondary | ICD-10-CM

## 2018-12-28 DIAGNOSIS — F1420 Cocaine dependence, uncomplicated: Secondary | ICD-10-CM

## 2018-12-28 MED ORDER — GABAPENTIN 300 MG PO CAPS
300.0000 mg | ORAL_CAPSULE | Freq: Three times a day (TID) | ORAL | Status: DC
  Administered 2018-12-28 – 2018-12-30 (×6): 300 mg via ORAL
  Filled 2018-12-28 (×10): qty 1

## 2018-12-28 MED ORDER — FLUOXETINE HCL 20 MG PO CAPS
20.0000 mg | ORAL_CAPSULE | Freq: Every day | ORAL | Status: DC
  Administered 2018-12-28 – 2018-12-30 (×3): 20 mg via ORAL
  Filled 2018-12-28 (×5): qty 1

## 2018-12-28 MED ORDER — BICTEGRAVIR-EMTRICITAB-TENOFOV 50-200-25 MG PO TABS
1.0000 | ORAL_TABLET | Freq: Every day | ORAL | Status: DC
  Administered 2018-12-28 – 2018-12-30 (×3): 1 via ORAL
  Filled 2018-12-28 (×4): qty 1

## 2018-12-28 NOTE — Progress Notes (Signed)
NUTRITION ASSESSMENT  Pt identified as at risk on the Malnutrition Screen Tool  INTERVENTION: 1. Supplements: Continue Boost Breeze po TID, each supplement provides 250 kcal and 9 grams of protein  NUTRITION DIAGNOSIS: Unintentional weight loss related to sub-optimal intake as evidenced by pt report.   Goal: Pt to meet >/= 90% of their estimated nutrition needs.  Monitor:  PO intake  Assessment:  Pt admitted with depression, substance abuse and homelessness. Pt has been using crack cocaine, benzos and ETOH per chart review. Pt consumed 100% of a meal in the ED prior to admission to [facility]. Pt has been ordered Boost Breeze supplements, will continue. Per weight records, pt has lost 9 lb since 10/06/18 (5% wt loss x 3 months, insignificant for time frame).   Height: Ht Readings from Last 1 Encounters:  12/27/18 5\' 7"  (1.702 m)    Weight: Wt Readings from Last 1 Encounters:  12/27/18 64.9 kg    Weight Hx: Wt Readings from Last 10 Encounters:  12/27/18 64.9 kg  12/14/18 72.6 kg  10/06/18 68.9 kg  06/19/18 74.8 kg  04/26/18 74.8 kg  02/26/18 74.8 kg  02/25/18 77.4 kg  01/29/18 74.8 kg  01/13/18 72.6 kg  12/19/17 72.6 kg    BMI:  Body mass index is 22.4 kg/m. Pt meets criteria for normal based on current BMI.  Estimated Nutritional Needs: Kcal: 25-30 kcal/kg Protein: > 1 gram protein/kg Fluid: 1 ml/kcal  Diet Order:  Diet Order            Diet regular Room service appropriate? Yes; Fluid consistency: Thin  Diet effective now             Pt is also offered choice of unit snacks mid-morning and mid-afternoon.  Pt is eating as desired.   Lab results and medications reviewed.   Tilda Franco, MS, RD, LDN [facility] Inpatient Clinical Dietitian Pager: [phone] After Hours Pager: [phone]

## 2018-12-28 NOTE — Plan of Care (Signed)
Nurse discussed anxiety, depression, coping skills with patient.  Patient has been encouraged to get out of bed and attend groups, etc. Today.

## 2018-12-28 NOTE — Progress Notes (Signed)
Recreation Therapy Notes  Date:  4.1.20 Time: 0930 Location: 300 Hall Dayroom  Group Topic: Stress Management  Goal Area(s) Addresses:  Patient will identify positive stress management techniques. Patient will identify benefits of using stress management post d/c.  Intervention: Stress Management  Activity :  Meditation.  LRT played a meditation that focused on letting go.  Patients were to listen as the meditation as it played to engage in the activity.  Education:  Stress Management, Discharge Planning.   Education Outcome: Acknowledges Education  Clinical Observations/Feedback:  Pt did not attend group.    Caroll Rancher, LRT/CTRS         Caroll Rancher A 12/28/2018 10:43 AM

## 2018-12-28 NOTE — Progress Notes (Signed)
Edwin Martinez reports that he relpased on crack cocaine and would like to be referred to an residential treatment facility. CSW explained that due to the COVID 19 pandemic, many residential treatment programs are not reviewing new referrals at this time ([facility] and [facility]). Patient declines outpatient referrals at this time  Enid Cutter, LCSW-A Clinical Social Worker

## 2018-12-28 NOTE — Progress Notes (Signed)
Admission Note:   D:32 yr male who presents VC in no acute distress for the treatment of SI and Depression. Pt appears flat and depressed. Pt was calm and cooperative with admission process. Pt presents with passive SI/ AH and contracts for safety upon admission. Pt denies VH . Pt stated he relapsed on Crack and wanted help finding LT Tx facility.   A:Skin was assessed and found to be clear of any abnormal marks apart from a rash on back, multiple tattoos. PT searched and no contraband found, POC and unit policies explained and understanding verbalized. Consents obtained. Food and fluids offered, and  Accepted.  R: Pt had no additional questions or concerns.

## 2018-12-28 NOTE — Progress Notes (Signed)
Psychoeducational Group Note  Date:  12/28/2018 Time:  0127  Group Topic/Focus:  Wrap-Up Group:   The focus of this group is to help patients review their daily goal of treatment and discuss progress on daily workbooks.  Participation Level: Did Not Attend  Participation Quality:  Not Applicable  Affect:  Not Applicable  Cognitive:  Not Applicable  Insight:  Not Applicable  Engagement in Group: Not Applicable  Additional Comments:  The patient was unable to attend group since he had yet to be admitted to the hallway.   Chevella Pearce S 12/28/2018, 1:27 AM

## 2018-12-28 NOTE — [facility] Suicide Risk Assessment (Signed)
[facility] INPATIENT:  Family/Significant Other Suicide Prevention Education  Suicide Prevention Education:  Patient Refusal for Family/Significant Other Suicide Prevention Education: The patient Edwin Martinez has refused to provide written consent for family/significant other to be provided Family/Significant Other Suicide Prevention Education during admission and/or prior to discharge.  Physician notified.  Reviewed SPE with patient. Patient still endorsing SI due to homelessness.   Darreld Mclean 12/28/2018, 9:43 AM
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
## 2018-12-28 NOTE — Progress Notes (Signed)
D:  Patient denied SI and HI, contracts for safety.  Denied A/V hallucinations.  Denied pain. A:  Medications administered per MD orders.  Emotional support and encouragement given patient. R:  Safety maintained with 15 minute checks.  Patient has stayed in bed all day.  Patient did go to dining room for lunch today.

## 2018-12-28 NOTE — Tx Team (Signed)
Interdisciplinary Treatment and Diagnostic Plan Update  12/28/2018 Time of Session: 10:00am Edwin Martinez MRN: [id number]  Principal Diagnosis: <principal problem not specified>  Secondary Diagnoses: Active Problems:   MDD (major depressive disorder), severe (HCC)   Current Medications:  Current Facility-Administered Medications  Medication Dose Route Frequency Provider Last Rate Last Dose  . bictegravir-emtricitabine-tenofovir AF (BIKTARVY) 50-200-25 MG per tablet 1 tablet  1 tablet Oral Daily Cobos, Myer Peer, MD   1 tablet at 12/28/18 1224  . cloNIDine (CATAPRES) tablet 0.1 mg  0.1 mg Oral QID Patriciaann Clan E, PA-C   0.1 mg at 12/28/18 1224   Followed by  . [START ON 12/30/2018] cloNIDine (CATAPRES) tablet 0.1 mg  0.1 mg Oral BH-qamhs Simon, Spencer E, PA-C       Followed by  . [START ON 01/01/2019] cloNIDine (CATAPRES) tablet 0.1 mg  0.1 mg Oral QAC breakfast Laverle Hobby, PA-C      . dicyclomine (BENTYL) tablet 20 mg  20 mg Oral Q6H PRN Patriciaann Clan E, PA-C      . feeding supplement (BOOST / RESOURCE BREEZE) liquid 1 Container  1 Container Oral TID BM Laverle Hobby, PA-C   1 Container at 12/28/18 1102  . FLUoxetine (PROZAC) capsule 20 mg  20 mg Oral Daily Johnn Hai, MD   20 mg at 12/28/18 1102  . gabapentin (NEURONTIN) capsule 300 mg  300 mg Oral TID Johnn Hai, MD   300 mg at 12/28/18 1104  . hydrOXYzine (ATARAX/VISTARIL) tablet 25 mg  25 mg Oral Q6H PRN Patriciaann Clan E, PA-C      . loperamide (IMODIUM) capsule 2-4 mg  2-4 mg Oral PRN Laverle Hobby, PA-C      . methocarbamol (ROBAXIN) tablet 500 mg  500 mg Oral Q8H PRN Laverle Hobby, PA-C      . naproxen (NAPROSYN) tablet 500 mg  500 mg Oral BID PRN Laverle Hobby, PA-C      . ondansetron (ZOFRAN-ODT) disintegrating tablet 4 mg  4 mg Oral Q6H PRN Laverle Hobby, PA-C      . traZODone (DESYREL) tablet 50 mg  50 mg Oral QHS,MR X 1 Simon, Spencer E, PA-C       PTA Medications: Medications Prior to  Admission  Medication Sig Dispense Refill Last Dose  . BIKTARVY 50-200-25 MG TABS tablet TAKE 1 TABLET BY MOUTH DAILY 30 tablet 0   . busPIRone (BUSPAR) 5 MG tablet Take 1 tablet (5 mg total) by mouth 2 (two) times daily. For anxiety 60 tablet 0   . citalopram (CELEXA) 10 MG tablet Take 1 tablet (10 mg total) by mouth daily. For mood 30 tablet 0   . gabapentin (NEURONTIN) 100 MG capsule Take 1 capsule (100 mg total) by mouth 3 (three) times daily. For anxiety/agitation 90 capsule 0   . risperiDONE (RISPERDAL) 1 MG tablet Take 1 tablet (1 mg total) by mouth at bedtime. For hallucinations 30 tablet 0   . traZODone (DESYREL) 50 MG tablet Take 1 tablet (50 mg total) by mouth at bedtime as needed for sleep. (Patient taking differently: Take 100 mg by mouth at bedtime as needed for sleep. ) 30 tablet 0     Patient Stressors: Financial difficulties Medication change or noncompliance Substance abuse  Patient Strengths: Technical sales engineer for treatment/growth  Treatment Modalities: Medication Management, Group therapy, Case management,  1 to 1 session with clinician, Psychoeducation, Recreational therapy.   Physician Treatment Plan for Primary Diagnosis: <principal problem  not specified> Long Term Goal(s): Improvement in symptoms so as ready for discharge Improvement in symptoms so as ready for discharge   Short Term Goals: Ability to identify triggers associated with substance abuse/mental health issues will improve Ability to identify and develop effective coping behaviors will improve  Medication Management: Evaluate patient's response, side effects, and tolerance of medication regimen.  Therapeutic Interventions: 1 to 1 sessions, Unit Group sessions and Medication administration.  Evaluation of Outcomes: Not Met  Physician Treatment Plan for Secondary Diagnosis: Active Problems:   MDD (major depressive disorder), severe ([place])  Long Term Goal(s): Improvement in  symptoms so as ready for discharge Improvement in symptoms so as ready for discharge   Short Term Goals: Ability to identify triggers associated with substance abuse/mental health issues will improve Ability to identify and develop effective coping behaviors will improve     Medication Management: Evaluate patient's response, side effects, and tolerance of medication regimen.  Therapeutic Interventions: 1 to 1 sessions, Unit Group sessions and Medication administration.  Evaluation of Outcomes: Not Met   RN Treatment Plan for Primary Diagnosis: <principal problem not specified> Long Term Goal(s): Knowledge of disease and therapeutic regimen to maintain health will improve  Short Term Goals: Ability to verbalize feelings will improve, Ability to identify and develop effective coping behaviors will improve and Compliance with prescribed medications will improve  Medication Management: RN will administer medications as ordered by provider, will assess and evaluate patient's response and provide education to patient for prescribed medication. RN will report any adverse and/or side effects to prescribing provider.  Therapeutic Interventions: 1 on 1 counseling sessions, Psychoeducation, Medication administration, Evaluate responses to treatment, Monitor vital signs and CBGs as ordered, Perform/monitor CIWA, COWS, AIMS and Fall Risk screenings as ordered, Perform wound care treatments as ordered.  Evaluation of Outcomes: Not Met   LCSW Treatment Plan for Primary Diagnosis: <principal problem not specified> Long Term Goal(s): Safe transition to appropriate next level of care at discharge, Engage patient in therapeutic group addressing interpersonal concerns.  Short Term Goals: Engage patient in aftercare planning with referrals and resources, Increase social support, Increase emotional regulation, Identify triggers associated with mental health/substance abuse issues and Increase skills for  wellness and recovery  Therapeutic Interventions: Assess for all discharge needs, 1 to 1 time with Social worker, Explore available resources and support systems, Assess for adequacy in community support network, Educate family and significant other(s) on suicide prevention, Complete Psychosocial Assessment, Interpersonal group therapy.  Evaluation of Outcomes: Not Met   Progress in Treatment: Attending groups: No. Participating in groups: No. Taking medication as prescribed: Yes. Toleration medication: Yes. Family/Significant other contact made: Yes, individual(s) contacted:  patient declines consents. Reviewed SPE with patient, patient still endorses SI Patient understands diagnosis: Yes. Discussing patient identified problems/goals with staff: No. Medical problems stabilized or resolved: Yes. Denies suicidal/homicidal ideation: No. Issues/concerns per patient self-inventory: Yes.  New problem(s) identified: Yes, Describe:  homeless, no income or insurance  New Short Term/Long Term Goal(s): detox, medication management for mood stabilization; elimination of SI thoughts; development of comprehensive mental wellness/sobriety plan.  Patient Goals:  Wants to go to residential treatment.  Discharge Plan or Barriers: Patient is only interested in residential treatment referrals. It is unlikely patient will be able to enter residential treatment at this time due to the [place] 19 pandemic. Patient will likely discharge with shelter resources.  Reason for Continuation of Hospitalization: Anxiety Depression Suicidal ideation  Estimated Length of Stay: 3-5 days  Attendees: Patient: Edwin Martinez  12/28/2018 12:28 PM  Physician:  12/28/2018 12:28 PM  Nursing:  12/28/2018 12:28 PM  RN Care Manager: 12/28/2018 12:28 PM  Social Worker: Stephanie Acre, [place] 12/28/2018 12:28 PM  Recreational Therapist:  12/28/2018 12:28 PM  Other:  12/28/2018 12:28 PM  Other:  12/28/2018 12:28 PM  Other: 12/28/2018 12:28 PM     Scribe for Treatment Team: Joellen [place], [place] 12/28/2018 12:28 PM

## 2018-12-28 NOTE — [facility] Suicide Risk Assessment (Signed)
[facility] Admission Suicide Risk Assessment   Nursing information obtained from:  Patient Demographic factors:  Male, Unemployed, Low socioeconomic status Current Mental Status:  Suicide plan Loss Factors:  NA Historical Factors:  Prior suicide attempts Risk Reduction Factors:  NA  Total Time spent with patient: 45 minutes Principal Problem: Substance abuse homelessness depression HIV positive status Diagnosis:  Active Problems:   MDD (major depressive disorder), severe (HCC)  Subjective Data: Currently alert poorly engaged in the interview process but answering questions minimally no eye contact denies current suicidal thoughts here predominantly for housing purposes is my assessment  Continued Clinical Symptoms:  Alcohol Use Disorder Identification Test Final Score (AUDIT): 6 The "Alcohol Use Disorders Identification Test", Guidelines for Use in Primary Care, Second Edition.  World Science writer [facility]). Score between 0-7:  no or low risk or alcohol related problems. Score between 8-15:  moderate risk of alcohol related problems. Score between 16-19:  high risk of alcohol related problems. Score 20 or above:  warrants further diagnostic evaluation for alcohol dependence and treatment.   CLINICAL FACTORS:   Alcohol/Substance Abuse/Dependencies      COGNITIVE FEATURES THAT CONTRIBUTE TO RISK:  Polarized thinking    SUICIDE RISK:   Minimal: No identifiable suicidal ideation.  Patients presenting with no risk factors but with morbid ruminations; may be classified as minimal risk based on the severity of the depressive symptoms I certify that inpatient services furnished can reasonably be expected to improve the patient's condition.   Malvin Johns, MD 12/28/2018, 10:12 AM

## 2018-12-28 NOTE — H&P (Signed)
Psychiatric Admission Assessment Adult  Patient Identification: Edwin Martinez MRN:  [id number] Date of Evaluation:  12/28/2018 Chief Complaint:  mdd cocaine use disorder Principal Diagnosis: Complaining of depressive symptoms/cocaine dependence/polysubstance abuse/homelessness Diagnosis:  Active Problems:   MDD (major depressive disorder), severe (HCC)  History of Present Illness:   This is the latest of multiple admissions and encounters with this healthcare system for Edwin Martinez, 33 year old homeless individual who has numerous comorbidities to include HIV positive status, chronic polysubstance abuse, presenting on 3/31 complaining of suicidal thoughts, requesting inpatient stabilization. Patient makes no eye contact through my exam and gives me vague answers but the bottom line is there clearly issues of secondary gain he is focused on finding long-term rehab for housing purposes, he states that he does not want any medication and later in the interview states his medications are not working and is requesting medications for anxiety. With regards to dangerousness he states he does not have thoughts of harming himself now and he can contract and understands what that means. He states he does not have withdrawal symptoms and does not want medications for detox purposes.  He states he does not have cocaine cravings. Drug screen is positive for cocaine and benzodiazepines and there is alcohol in his system though he is not intoxicated on presentation  According to our assessment team yesterday's note reads as follows Edwin Martinez is an 33 y.o. male.  The pt came in due to suicidal thoughts.  The pt stated he is having thoughts to walk into traffic.  The pt's last suicide attempt was 09/2018 and he overdosed on pills at that time.  He has had several suicide attempts in the past.  His grandmother and uncle died by suicide.  The pt stated he is stressed because his first cousin was  murdered recently and being homeless.  The pt is using about 20-30 dollars worth of crack cocaine about every other day.  He last used yesterday.  The pt isn't seeing a counselor currently and hasn't seen one in several years.  He has had several hospitalizations and was last hospitalized at [facility] January 2020.  The pt is currently homeless and is sleeping on the streets.  He denies self harm, HI, legal issues and history of abuse.  He denies hallucinations, but stated he feels that people are out to get him.  The pt stated he is sleeping about 2-3 hours a night and has a good appetite.  The pt reports feeling hopeless, having problems concentrating, and crying spells.  The pt's UDS is positive for cocaine and benzodiazapine.  The pt's longest period of sobriety was a year and 4 months, when he was in jail.  Pt is dressed in scrubs. He is drowsy and oriented x4. Pt speaks in a clear tone, at moderate volume and normal pace. Eye contact is good. Pt's mood is depressed. Thought process is coherent and relevant. There is no indication Pt is currently responding to internal stimuli or experiencing delusional thought content.?Pt was cooperative throughout assessment.     Associated Signs/Symptoms: Depression Symptoms:  depressed mood, (Hypo) Manic Symptoms:  Impulsivity, Anxiety Symptoms:  Excessive Worry, Psychotic Symptoms:  Hallucinations: Auditory PTSD Symptoms: NA Total Time spent with patient: 45 minutes  Past Psychiatric History: Extensive and marked by issues of secondary gain predominantly  Is the patient at risk to self? Yes.    Has the patient been a risk to self in the past 6 months? Yes.    Has  the patient been a risk to self within the distant past? Yes.    Is the patient a risk to others? No.  Has the patient been a risk to others in the past 6 months? No.  Has the patient been a risk to others within the distant past? No.   Alcohol Screening: 1. How often do you have a drink  containing alcohol?: 2 to 4 times a month 2. How many drinks containing alcohol do you have on a typical day when you are drinking?: 3 or 4 3. How often do you have six or more drinks on one occasion?: Less than monthly AUDIT-C Score: 4 4. How often during the last year have you found that you were not able to stop drinking once you had started?: Never 5. How often during the last year have you failed to do what was normally expected from you becasue of drinking?: Less than monthly 6. How often during the last year have you needed a first drink in the morning to get yourself going after a heavy drinking session?: Never 7. How often during the last year have you had a feeling of guilt of remorse after drinking?: Never 8. How often during the last year have you been unable to remember what happened the night before because you had been drinking?: Less than monthly 9. Have you or someone else been injured as a result of your drinking?: No 10. Has a relative or friend or a doctor or another health worker been concerned about your drinking or suggested you cut down?: No Alcohol Use Disorder Identification Test Final Score (AUDIT): 6 Substance Abuse History in the last 12 months:  Yes.   Consequences of Substance Abuse: NA Previous Psychotropic Medications: Yes  Psychological Evaluations: No  Past Medical History:  Past Medical History:  Diagnosis Date  . ADD (attention deficit disorder)   . Anxiety   . Asthma   . Bipolar 1 disorder (HCC)   . Depression   . HIV (human immunodeficiency virus infection) (HCC) dx'd 2008  . Hypertension   . Insomnia   . Intentional drug overdose (HCC)    [name] 02/25/2018  . Polysubstance abuse (HCC)    [name] 02/25/2018  . Schizophrenia (HCC)   . Seizures (HCC)    "used to have little black-out szs where I'd drop out for 2-3 min then come back; nothing in the last 2-3-4years" (02/25/2018)  . Shingles     Past Surgical History:  Procedure Laterality Date  .  DENTAL SURGERY     "had my eye teeth pulled down"   Family History:  Family History  Problem Relation Age of Onset  . Huntington's disease Father   . Heart disease Mother   . Suicidality Maternal Uncle   . Suicidality Maternal Grandmother    Family Psychiatric  History: neg Tobacco Screening: Have you used any form of tobacco in the last 30 days? (Cigarettes, Smokeless Tobacco, Cigars, and/or Pipes): Yes Tobacco use, Select all that apply: 5 or more cigarettes per day Are you interested in Tobacco Cessation Medications?: No, patient refused Counseled patient on smoking cessation including recognizing danger situations, developing coping skills and basic information about quitting provided: Refused/Declined practical counseling Social History:  Social History   Substance and Sexual Activity  Alcohol Use Yes  . Alcohol/week: 4.0 standard drinks  . Types: 4 Cans of beer per week   Comment: occasionally, "socially"     Social History   Substance and Sexual Activity  Drug Use  Yes  . Types: Marijuana, Cocaine   Comment: 10/06/2018 "self medicating occassionally"    Additional Social History:                           Allergies:   Allergies  Allergen Reactions  . Magnesium-Containing Compounds Other (See Comments)    This medication is contraindicated with pts HIV meds.    . Peanut-Containing Drug Products Anaphylaxis  . Esomeprazole Magnesium Cough  . Atripla [Efavirenz-Emtricitab-Tenofovir] Other (See Comments)    Reaction:  Suicidal thoughts   . Atripla [Efavirenz-Emtricitab-Tenofovir]   . Bactrim [Sulfamethoxazole-Trimethoprim]   . Magnesium-Containing Compounds   . Nexium [Esomeprazole Magnesium]   . Peanut-Containing Drug Products   . Penicillins   . Bactrim [Sulfamethoxazole-Trimethoprim] Rash  . Penicillins Rash and Other (See Comments)    Has patient had a PCN reaction causing immediate rash, facial/tongue/throat swelling, SOB or lightheadedness with  hypotension: Yes Has patient had a PCN reaction causing severe rash involving mucus membranes or skin necrosis: No Has patient had a PCN reaction that required hospitalization No Has patient had a PCN reaction occurring within the last 10 years: No If all of the above answers are "NO", then may proceed with Cephalosporin use.   Lab Results:  Results for orders placed or performed during the hospital encounter of 12/27/18 (from the past 48 hour(s))  Comprehensive metabolic panel     Status: Abnormal   Collection Time: 12/27/18 12:38 PM  Result Value Ref Range   Sodium 137 135 - 145 mmol/L   Potassium 3.9 3.5 - 5.1 mmol/L   Chloride 105 98 - 111 mmol/L   CO2 25 22 - 32 mmol/L   Glucose, Bld 67 (L) 70 - 99 mg/dL   BUN 11 6 - 20 mg/dL   Creatinine, Ser 1.61 0.61 - 1.24 mg/dL   Calcium 8.8 (L) 8.9 - 10.3 mg/dL   Total Protein 7.6 6.5 - 8.1 g/dL   Albumin 3.9 3.5 - 5.0 g/dL   AST 096 (H) 15 - 41 U/L   ALT 81 (H) 0 - 44 U/L   Alkaline Phosphatase 76 38 - 126 U/L   Total Bilirubin 0.4 0.3 - 1.2 mg/dL   GFR calc non Af Amer >60 >60 mL/min   GFR calc Af Amer >60 >60 mL/min   Anion gap 7 5 - 15    Comment: Performed at [facility], [address] [place], [place] [address]  Ethanol     Status: Abnormal   Collection Time: 12/27/18 12:38 PM  Result Value Ref Range   Alcohol, Ethyl (B) 29 (H) <10 mg/dL    Comment: (NOTE) Lowest detectable limit for serum alcohol is 10 mg/dL. For medical purposes only. Performed at [facility], [address] [place], [place] [address]   Urine rapid drug screen (hosp performed)     Status: Abnormal   Collection Time: 12/27/18 12:38 PM  Result Value Ref Range   Opiates NONE DETECTED NONE DETECTED   Cocaine POSITIVE (A) NONE DETECTED   Benzodiazepines POSITIVE (A) NONE DETECTED   Amphetamines NONE DETECTED NONE DETECTED   Tetrahydrocannabinol NONE DETECTED NONE DETECTED   Barbiturates NONE DETECTED NONE DETECTED    Comment: (NOTE) DRUG  SCREEN FOR MEDICAL PURPOSES ONLY.  IF CONFIRMATION IS NEEDED FOR ANY PURPOSE, NOTIFY LAB WITHIN 5 DAYS. LOWEST DETECTABLE LIMITS FOR URINE DRUG SCREEN Drug Class  Cutoff (ng/mL) Amphetamine and metabolites    1000 Barbiturate and metabolites    200 Benzodiazepine                 200 Tricyclics and metabolites     300 Opiates and metabolites        300 Cocaine and metabolites        300 THC                            50 Performed at [facility], [address] [place], [place] [address]   CBC with Diff     Status: None   Collection Time: 12/27/18 12:38 PM  Result Value Ref Range   WBC 9.5 4.0 - 10.5 K/uL   RBC 5.49 4.22 - 5.81 MIL/uL   Hemoglobin 15.0 13.0 - 17.0 g/dL   HCT 31.4 97.0 - 26.3 %   MCV 85.8 80.0 - 100.0 fL   MCH 27.3 26.0 - 34.0 pg   MCHC 31.8 30.0 - 36.0 g/dL   RDW 78.5 88.5 - 02.7 %   Platelets 355 150 - 400 K/uL   nRBC 0.0 0.0 - 0.2 %   Neutrophils Relative % 49 %   Neutro Abs 4.6 1.7 - 7.7 K/uL   Lymphocytes Relative 42 %   Lymphs Abs 4.0 0.7 - 4.0 K/uL   Monocytes Relative 6 %   Monocytes Absolute 0.5 0.1 - 1.0 K/uL   Eosinophils Relative 2 %   Eosinophils Absolute 0.2 0.0 - 0.5 K/uL   Basophils Relative 1 %   Basophils Absolute 0.1 0.0 - 0.1 K/uL   Immature Granulocytes 0 %   Abs Immature Granulocytes 0.04 0.00 - 0.07 K/uL    Comment: Performed at [facility], [address], [place], [place] [address]  Acetaminophen level     Status: Abnormal   Collection Time: 12/27/18 12:38 PM  Result Value Ref Range   Acetaminophen (Tylenol), Serum <10 (L) 10 - 30 ug/mL    Comment: (NOTE) Therapeutic concentrations vary significantly. A range of 10-30 ug/mL  may be an effective concentration for many patients. However, some  are best treated at concentrations outside of this range. Acetaminophen concentrations >150 ug/mL at 4 hours after ingestion  and >50 ug/mL at 12 hours after ingestion are often associated with  toxic  reactions. Performed at [facility], [address], [place], [place] [address]   Salicylate level     Status: None   Collection Time: 12/27/18 12:38 PM  Result Value Ref Range   Salicylate Lvl <7.0 2.8 - 30.0 mg/dL    Comment: Performed at [facility], [address], [place], [place] [address]  CBG monitoring, ED     Status: Abnormal   Collection Time: 12/27/18  2:10 PM  Result Value Ref Range   Glucose-Capillary 69 (L) 70 - 99 mg/dL   Comment 1 Notify RN    Comment 2 Document in Chart   CBG monitoring, ED     Status: Abnormal   Collection Time: 12/27/18  3:04 PM  Result Value Ref Range   Glucose-Capillary 143 (H) 70 - 99 mg/dL    Blood Alcohol level:  Lab Results  Component Value Date   ETH 29 (H) 12/27/2018   ETH <10 10/05/2018    Metabolic Disorder Labs:  Lab Results  Component Value Date   HGBA1C 5.6 12/24/2016   MPG 114 12/24/2016  MPG 111 05/04/2016   Lab Results  Component Value Date   PROLACTIN 28.8 (H) 12/24/2016   PROLACTIN 32.3 (H) 08/19/2016   Lab Results  Component Value Date   CHOL 147 08/31/2018   TRIG 189 (H) 08/31/2018   HDL 45 08/31/2018   CHOLHDL 3.3 08/31/2018   VLDL 26 12/24/2016   LDLCALC 74 08/31/2018   LDLCALC 72 04/06/2018    Current Medications: Current Facility-Administered Medications  Medication Dose Route Frequency Provider Last Rate Last Dose  . bictegravir-emtricitabine-tenofovir AF (BIKTARVY) 50-200-25 MG per tablet 1 tablet  1 tablet Oral Daily Cobos, Fernando A, MD      . cloNIDine (CATAPRES) tablet 0.1 mg  0.1 mg Oral QID Donell Sievert E, PA-C   0.1 mg at 12/28/18 0944   Followed by  . [START ON 12/30/2018] cloNIDine (CATAPRES) tablet 0.1 mg  0.1 mg Oral BH-qamhs Simon, Spencer E, PA-C       Followed by  . [START ON 01/01/2019] cloNIDine (CATAPRES) tablet 0.1 mg  0.1 mg Oral QAC breakfast Kerry Hough, PA-C      . dicyclomine (BENTYL) tablet 20 mg  20 mg Oral Q6H PRN Donell Sievert E, PA-C      .  feeding supplement (BOOST / RESOURCE BREEZE) liquid 1 Container  1 Container Oral TID BM Kerry Hough, PA-C      . FLUoxetine (PROZAC) capsule 20 mg  20 mg Oral Daily Malvin Johns, MD      . gabapentin (NEURONTIN) capsule 300 mg  300 mg Oral TID Malvin Johns, MD      . hydrOXYzine (ATARAX/VISTARIL) tablet 25 mg  25 mg Oral Q6H PRN Kerry Hough, PA-C      . loperamide (IMODIUM) capsule 2-4 mg  2-4 mg Oral PRN Kerry Hough, PA-C      . methocarbamol (ROBAXIN) tablet 500 mg  500 mg Oral Q8H PRN Kerry Hough, PA-C      . naproxen (NAPROSYN) tablet 500 mg  500 mg Oral BID PRN Kerry Hough, PA-C      . ondansetron (ZOFRAN-ODT) disintegrating tablet 4 mg  4 mg Oral Q6H PRN Kerry Hough, PA-C      . traZODone (DESYREL) tablet 50 mg  50 mg Oral QHS,MR X 1 Simon, Spencer E, PA-C       PTA Medications: Medications Prior to Admission  Medication Sig Dispense Refill Last Dose  . BIKTARVY 50-200-25 MG TABS tablet TAKE 1 TABLET BY MOUTH DAILY 30 tablet 0   . busPIRone (BUSPAR) 5 MG tablet Take 1 tablet (5 mg total) by mouth 2 (two) times daily. For anxiety 60 tablet 0   . citalopram (CELEXA) 10 MG tablet Take 1 tablet (10 mg total) by mouth daily. For mood 30 tablet 0   . gabapentin (NEURONTIN) 100 MG capsule Take 1 capsule (100 mg total) by mouth 3 (three) times daily. For anxiety/agitation 90 capsule 0   . risperiDONE (RISPERDAL) 1 MG tablet Take 1 tablet (1 mg total) by mouth at bedtime. For hallucinations 30 tablet 0   . traZODone (DESYREL) 50 MG tablet Take 1 tablet (50 mg total) by mouth at bedtime as needed for sleep. (Patient taking differently: Take 100 mg by mouth at bedtime as needed for sleep. ) 30 tablet 0     Musculoskeletal: Strength & Muscle Tone: within normal limits Gait & Station: normal Patient leans: N/A  Psychiatric Specialty Exam: Physical Exam  ROS  Blood pressure 121/71, pulse (!) 52, temperature  97.8 F (36.6 C), temperature source Oral, resp. rate 18,  height 5\' 7"  (1.702 m), weight 64.9 kg.Body mass index is 22.4 kg/m.  General Appearance: Guarded  Eye Contact:  None  Speech:  Garbled  Volume:  Decreased  Mood:  apathetic and disinterested in the interview process  Affect:  Congruent  Thought Process:  Linear  Orientation:  Full (Time, Place, and Person)  Thought Content:  Logical  Suicidal Thoughts:  No  Homicidal Thoughts:  No  Memory:  Immediate;   Fair  Judgement:  Fair  Insight:  Fair  Psychomotor Activity:  Decreased  Concentration:  Concentration: Fair  Recall:  [employer] of Knowledge:  Fair  Language:  Fair  Akathisia:  Negative  Handed:  Right  AIMS (if indicated):     Assets:  Leisure Time  ADL's:  Intact  Cognition:  WNL  Sleep:  Number of Hours: 6    Treatment Plan Summary: Daily contact with patient to assess and evaluate symptoms and progress in treatment, Medication management and Plan We will go ahead and change his antidepressant at his request we will go ahead and add gabapentin for anxiety avoid schedule II drugs continue cognitive and rehab work patient not medically engaged in therapy I believe he is here predominantly for issues of secondary gain  Observation Level/Precautions:  Detox  Laboratory:  UDS  Psychotherapy: Rehab based  Medications: Gabapentin and fluoxetine ordered  Consultations: None necessary  Discharge Concerns: Long-term housing and sobriety  Estimated LOS: 3 days  Other: Swelling cocaine dependence polysubstance abuse, depression recurrent severe/issues of secondary gain prominent   Physician Treatment Plan for Primary Diagnosis: <principal problem not specified> Long Term Goal(s): Improvement in symptoms so as ready for discharge  Short Term Goals: Ability to identify triggers associated with substance abuse/mental health issues will improve  Physician Treatment Plan for Secondary Diagnosis: Active Problems:   MDD (major depressive disorder), severe (HCC)  Long Term  Goal(s): Improvement in symptoms so as ready for discharge  Short Term Goals: Ability to identify and develop effective coping behaviors will improve  I certify that inpatient services furnished can reasonably be expected to improve the patient's condition.    Malvin Johns, MD 4/1/202010:06 AM

## 2018-12-28 NOTE — Tx Team (Signed)
Initial Treatment Plan 12/28/2018 12:21 AM [name] KPT:[id number]    PATIENT STRESSORS: Financial difficulties Medication change or noncompliance Substance abuse   PATIENT STRENGTHS: General fund of knowledge Motivation for treatment/growth   PATIENT IDENTIFIED PROBLEMS: Risk for suicide  depression  SA "crack"  " want to go to a long term substance abuse treatment "               DISCHARGE CRITERIA:  Adequate post-discharge living arrangements Improved stabilization in mood, thinking, and/or behavior Verbal commitment to aftercare and medication compliance  PRELIMINARY DISCHARGE PLAN: Attend aftercare/continuing care group Attend 12-step recovery group Outpatient therapy Placement in alternative living arrangements  PATIENT/FAMILY INVOLVEMENT: This treatment plan has been presented to and reviewed with the patient, Edwin Martinez .  The patient and family have been given the opportunity to ask questions and make suggestions.  Delos Haring, RN 12/28/2018, 12:21 AM

## 2018-12-29 NOTE — Progress Notes (Signed)
D: Pt was in the dayroom upon initial approach.  Pt presents with depressed affect and mood.  He reports his day was "okay."  His goal is "just going home because rehab isn't accepting anybody."  Pt denies SI/HI, denies hallucinations, reports lower tooth pain of 8/10.  Pt has been visible in milieu interacting with peers and staff appropriately.    A: Introduced self to pt.  Met with pt 1:1.  Actively listened to pt and offered support and encouragement.  Medications administered per order.  PRN medication administered for pain.  Q15 minute safety checks maintained.  R: Pt is safe on the unit.  Pt is compliant with medications.  Pt verbally contracts for safety.  Will continue to monitor and assess.

## 2018-12-29 NOTE — B[facility] Group Notes (Signed)
LCSW Group Therapy Notes 12/29/2018 2:02 PM  Type of Therapy and Topic: Group Therapy: Overcoming Obstacles  Participation Level: Did Not Attend  Description of Group:  In this group patients will be encouraged to explore what they see as obstacles to their own wellness and recovery. They will be guided to discuss their thoughts, feelings, and behaviors related to these obstacles. The group will process together ways to cope with barriers, with attention given to specific choices patients can make. Each patient will be challenged to identify changes they are motivated to make in order to overcome their obstacles. This group will be process-oriented, with patients participating in exploration of their own experiences as well as giving and receiving support and challenge from other group members.  Therapeutic Goals: 1. Patient will identify personal and current obstacles as they relate to admission. 2. Patient will identify barriers that currently interfere with their wellness or overcoming obstacles.  3. Patient will identify feelings, thought process and behaviors related to these barriers. 4. Patient will identify two changes they are willing to make to overcome these obstacles:   Summary of Patient Progress Invited, did not attend.   Therapeutic Modalities:  Cognitive Behavioral Therapy Solution Focused Therapy Motivational Interviewing Relapse Prevention Therapy  Enid Cutter, MSW, [facility] 12/29/2018 2:02 PM

## 2018-12-29 NOTE — Progress Notes (Signed)
[facility] MD Progress Note  12/29/2018 10:04 AM Edwin Martinez  MRN:  [id number] Subjective:    Patient up in the day room makes eye contact today a little more engaged in the whole interview process unlike yesterday clearly an issue of secondary gain discussed he needs to find housing I constructed and that we will keep him till tomorrow with his detox will be completed he does not seem to be having any withdrawal symptoms affect blood pressure low.  He denies thoughts of harming self or others Principal Problem: Chronic issues of secondary gain skin chronic psychiatric complaints and hospitalization-complicated by polysubstance abuse this time involving alcohol benzodiazepines and cocaine Diagnosis: Active Problems:   MDD (major depressive disorder), severe (HCC)  Total Time spent with patient: 20 minutes   Past Medical History:  Past Medical History:  Diagnosis Date  . ADD (attention deficit disorder)   . Anxiety   . Asthma   . Bipolar 1 disorder (HCC)   . Depression   . HIV (human immunodeficiency virus infection) (HCC) dx'd 2008  . Hypertension   . Insomnia   . Intentional drug overdose (HCC)    [name] 02/25/2018  . Polysubstance abuse (HCC)    [name] 02/25/2018  . Schizophrenia (HCC)   . Seizures (HCC)    "used to have little black-out szs where I'd drop out for 2-3 min then come back; nothing in the last 2-3-4years" (02/25/2018)  . Shingles     Past Surgical History:  Procedure Laterality Date  . DENTAL SURGERY     "had my eye teeth pulled down"   Family History:  Family History  Problem Relation Age of Onset  . Huntington's disease Father   . Heart disease Mother   . Suicidality Maternal Uncle   . Suicidality Maternal Grandmother     Social History:  Social History   Substance and Sexual Activity  Alcohol Use Yes  . Alcohol/week: 4.0 standard drinks  . Types: 4 Cans of beer per week   Comment: occasionally, "socially"     Social History   Substance and  Sexual Activity  Drug Use Yes  . Types: Marijuana, Cocaine   Comment: 10/06/2018 "self medicating occassionally"    Social History   Socioeconomic History  . Marital status: Single    Spouse name: Not on file  . Number of children: Not on file  . Years of education: Not on file  . Highest education level: Not on file  Occupational History  . Occupation: disability pending  Social Needs  . Financial resource strain: Not on file  . Food insecurity:    Worry: Not on file    Inability: Not on file  . Transportation needs:    Medical: Not on file    Non-medical: Not on file  Tobacco Use  . Smoking status: Current Every Day Smoker    Packs/day: 0.50    Years: 20.00    Pack years: 10.00    Types: Cigarettes    Start date: 09/29/1991  . Smokeless tobacco: Never Used  Substance and Sexual Activity  . Alcohol use: Yes    Alcohol/week: 4.0 standard drinks    Types: 4 Cans of beer per week    Comment: occasionally, "socially"  . Drug use: Yes    Types: Marijuana, Cocaine    Comment: 10/06/2018 "self medicating occassionally"  . Sexual activity: Yes    Birth control/protection: Condom  Lifestyle  . Physical activity:    Days per week: Not on file  Minutes per session: Not on file  . Stress: Not on file  Relationships  . Social connections:    Talks on phone: Not on file    Gets together: Not on file    Attends religious service: Not on file    Active member of club or organization: Not on file    Attends meetings of clubs or organizations: Not on file    Relationship status: Not on file  Other Topics Concern  . Not on file  Social History Narrative   ** Merged History Encounter **       ** Merged History Encounter **       Additional Social History:                         Sleep: Good  Appetite:  Good  Current Medications: Current Facility-Administered Medications  Medication Dose Route Frequency Provider Last Rate Last Dose  .  bictegravir-emtricitabine-tenofovir AF (BIKTARVY) 50-200-25 MG per tablet 1 tablet  1 tablet Oral Daily Cobos, Rockey Situ, MD   1 tablet at 12/29/18 0827  . cloNIDine (CATAPRES) tablet 0.1 mg  0.1 mg Oral QID Kerry Hough, PA-C   0.1 mg at 12/29/18 0827   Followed by  . [START ON 12/30/2018] cloNIDine (CATAPRES) tablet 0.1 mg  0.1 mg Oral BH-qamhs Simon, Spencer E, PA-C       Followed by  . [START ON 01/01/2019] cloNIDine (CATAPRES) tablet 0.1 mg  0.1 mg Oral QAC breakfast Kerry Hough, PA-C      . dicyclomine (BENTYL) tablet 20 mg  20 mg Oral Q6H PRN Donell Sievert E, PA-C      . feeding supplement (BOOST / RESOURCE BREEZE) liquid 1 Container  1 Container Oral TID BM Kerry Hough, PA-C   1 Container at 12/28/18 1358  . FLUoxetine (PROZAC) capsule 20 mg  20 mg Oral Daily Malvin Johns, MD   20 mg at 12/29/18 0827  . gabapentin (NEURONTIN) capsule 300 mg  300 mg Oral TID Malvin Johns, MD   300 mg at 12/29/18 0827  . hydrOXYzine (ATARAX/VISTARIL) tablet 25 mg  25 mg Oral Q6H PRN Kerry Hough, PA-C      . loperamide (IMODIUM) capsule 2-4 mg  2-4 mg Oral PRN Kerry Hough, PA-C      . methocarbamol (ROBAXIN) tablet 500 mg  500 mg Oral Q8H PRN Kerry Hough, PA-C      . naproxen (NAPROSYN) tablet 500 mg  500 mg Oral BID PRN Kerry Hough, PA-C      . ondansetron (ZOFRAN-ODT) disintegrating tablet 4 mg  4 mg Oral Q6H PRN Kerry Hough, PA-C      . traZODone (DESYREL) tablet 50 mg  50 mg Oral QHS,MR X 1 Kerry Hough, PA-C        Lab Results:  Results for orders placed or performed during the hospital encounter of 12/27/18 (from the past 48 hour(s))  Comprehensive metabolic panel     Status: Abnormal   Collection Time: 12/27/18 12:38 PM  Result Value Ref Range   Sodium 137 135 - 145 mmol/L   Potassium 3.9 3.5 - 5.1 mmol/L   Chloride 105 98 - 111 mmol/L   CO2 25 22 - 32 mmol/L   Glucose, Bld 67 (L) 70 - 99 mg/dL   BUN 11 6 - 20 mg/dL   Creatinine, Ser 2.95 0.61 - 1.24  mg/dL   Calcium 8.8 (L) 8.9 -  10.3 mg/dL   Total Protein 7.6 6.5 - 8.1 g/dL   Albumin 3.9 3.5 - 5.0 g/dL   AST 409 (H) 15 - 41 U/L   ALT 81 (H) 0 - 44 U/L   Alkaline Phosphatase 76 38 - 126 U/L   Total Bilirubin 0.4 0.3 - 1.2 mg/dL   GFR calc non Af Amer >60 >60 mL/min   GFR calc Af Amer >60 >60 mL/min   Anion gap 7 5 - 15    Comment: Performed at [facility], [address] [place], [place] [address]  Ethanol     Status: Abnormal   Collection Time: 12/27/18 12:38 PM  Result Value Ref Range   Alcohol, Ethyl (B) 29 (H) <10 mg/dL    Comment: (NOTE) Lowest detectable limit for serum alcohol is 10 mg/dL. For medical purposes only. Performed at [facility], [address] [place], [place] [address]   Urine rapid drug screen (hosp performed)     Status: Abnormal   Collection Time: 12/27/18 12:38 PM  Result Value Ref Range   Opiates NONE DETECTED NONE DETECTED   Cocaine POSITIVE (A) NONE DETECTED   Benzodiazepines POSITIVE (A) NONE DETECTED   Amphetamines NONE DETECTED NONE DETECTED   Tetrahydrocannabinol NONE DETECTED NONE DETECTED   Barbiturates NONE DETECTED NONE DETECTED    Comment: (NOTE) DRUG SCREEN FOR MEDICAL PURPOSES ONLY.  IF CONFIRMATION IS NEEDED FOR ANY PURPOSE, NOTIFY LAB WITHIN 5 DAYS. LOWEST DETECTABLE LIMITS FOR URINE DRUG SCREEN Drug Class                     Cutoff (ng/mL) Amphetamine and metabolites    1000 Barbiturate and metabolites    200 Benzodiazepine                 200 Tricyclics and metabolites     300 Opiates and metabolites        300 Cocaine and metabolites        300 THC                            50 Performed at [facility], [address], [place], [place] [address]   CBC with Diff     Status: None   Collection Time: 12/27/18 12:38 PM  Result Value Ref Range   WBC 9.5 4.0 - 10.5 K/uL   RBC 5.49 4.22 - 5.81 MIL/uL   Hemoglobin 15.0 13.0 - 17.0 g/dL   HCT 08.6 57.8 - 46.9 %   MCV 85.8 80.0 - 100.0 fL   MCH 27.3  26.0 - 34.0 pg   MCHC 31.8 30.0 - 36.0 g/dL   RDW 62.9 52.8 - 41.3 %   Platelets 355 150 - 400 K/uL   nRBC 0.0 0.0 - 0.2 %   Neutrophils Relative % 49 %   Neutro Abs 4.6 1.7 - 7.7 K/uL   Lymphocytes Relative 42 %   Lymphs Abs 4.0 0.7 - 4.0 K/uL   Monocytes Relative 6 %   Monocytes Absolute 0.5 0.1 - 1.0 K/uL   Eosinophils Relative 2 %   Eosinophils Absolute 0.2 0.0 - 0.5 K/uL   Basophils Relative 1 %   Basophils Absolute 0.1 0.0 - 0.1 K/uL   Immature Granulocytes 0 %   Abs Immature Granulocytes 0.04 0.00 - 0.07 K/uL    Comment: Performed at [facility], [address], [place], [place] [address]  Acetaminophen level     Status: Abnormal   Collection Time: 12/27/18 12:38 PM  Result Value Ref Range   Acetaminophen (Tylenol), Serum <10 (L) 10 - 30 ug/mL    Comment: (NOTE) Therapeutic concentrations vary significantly. A range of 10-30 ug/mL  may be an effective concentration for many patients. However, some  are best treated at concentrations outside of this range. Acetaminophen concentrations >150 ug/mL at 4 hours after ingestion  and >50 ug/mL at 12 hours after ingestion are often associated with  toxic reactions. Performed at [facility], [address], [place], [place] [address]   Salicylate level     Status: None   Collection Time: 12/27/18 12:38 PM  Result Value Ref Range   Salicylate Lvl <7.0 2.8 - 30.0 mg/dL    Comment: Performed at [facility], [address] [place], [place] [address]  CBG monitoring, ED     Status: Abnormal   Collection Time: 12/27/18  2:10 PM  Result Value Ref Range   Glucose-Capillary 69 (L) 70 - 99 mg/dL   Comment 1 Notify RN    Comment 2 Document in Chart   CBG monitoring, ED     Status: Abnormal   Collection Time: 12/27/18  3:04 PM  Result Value Ref Range   Glucose-Capillary 143 (H) 70 - 99 mg/dL    Blood Alcohol level:  Lab Results  Component Value Date   ETH 29 (H) 12/27/2018   ETH <10 10/05/2018     Metabolic Disorder Labs: Lab Results  Component Value Date   HGBA1C 5.6 12/24/2016   MPG 114 12/24/2016   MPG 111 05/04/2016   Lab Results  Component Value Date   PROLACTIN 28.8 (H) 12/24/2016   PROLACTIN 32.3 (H) 08/19/2016   Lab Results  Component Value Date   CHOL 147 08/31/2018   TRIG 189 (H) 08/31/2018   HDL 45 08/31/2018   CHOLHDL 3.3 08/31/2018   VLDL 26 12/24/2016   LDLCALC 74 08/31/2018   LDLCALC 72 04/06/2018    Physical Findings: AIMS: Facial and Oral Movements Muscles of Facial Expression: None, normal Lips and Perioral Area: None, normal Jaw: None, normal Tongue: None, normal,Extremity Movements Upper (arms, wrists, hands, fingers): None, normal Lower (legs, knees, ankles, toes): None, normal, Trunk Movements Neck, shoulders, hips: None, normal, Overall Severity Severity of abnormal movements (highest score from questions above): None, normal Incapacitation due to abnormal movements: None, normal Patient's awareness of abnormal movements (rate only patient's report): No Awareness, Dental Status Current problems with teeth and/or dentures?: No Does patient usually wear dentures?: No  CIWA:  CIWA-Ar Total: 1 COWS:  COWS Total Score: 4  Musculoskeletal: Strength & Muscle Tone: within normal limits Gait & Station: normal Psychiatric Specialty Exam: Physical Exam  ROS  Blood pressure 105/70, pulse (!) 52, temperature 97.8 F (36.6 C), temperature source Oral, resp. rate 18, height 5\' 7"  (1.702 m), weight 64.9 kg.Body mass index is 22.4 kg/m.  General Appearance: Casual  Eye Contact:  Good  Speech:  Clear and Coherent  Volume:  Decreased  Mood:  Dysphoric  Affect:  Congruent  Thought Process:  Coherent  Orientation:  Full (Time, Place, and Person)  Thought Content:  Logical  Suicidal Thoughts:  No  Homicidal Thoughts:  No  Memory:  Immediate;   Good  Judgement:  Good  Insight:  Fair  Psychomotor Activity:  Normal  Concentration:   Concentration: Fair  Recall:  Fair  Fund of Knowledge:  Fair  Language:  Fair  Akathisia:  Negative  Handed:  Right  AIMS (if indicated):     Assets:  Physical Health Resilience  ADL's:  Intact  Cognition:  WNL  Sleep:  Number of Hours: 8.25     Treatment Plan Summary: Daily contact with patient to assess and evaluate symptoms and progress in treatment, Medication management and Plan Continue current measures and precautions probable discharge tomorrow  Malvin Johns, MD 12/29/2018, 10:04 AM

## 2018-12-29 NOTE — Plan of Care (Signed)
Problem: Education: Goal: Knowledge of [facility] Education information/materials will improve Outcome: Progressing   Problem: Activity: Goal: Interest or engagement in activities will improve Outcome: Progressing Goal: Sleeping patterns will improve Outcome: Progressing   Problem: Coping: Goal: Ability to verbalize frustrations and anger appropriately will improve Outcome: Progressing   Problem: Safety: Goal: Periods of time without injury will increase Outcome: Progressing DAR Note: Pt A & O X4. Observed in bed with blanket over his head on initial approach. Covered his face with his hand when up in hall for medications. Denies SI, HI and AVH when assessed. Reports he slept fairly last night with good appetite, low energy and good concentration level. Rates his depression, anxiety and hopelessness all 10/10 on self inventory sheet. Pt's goal for today is "to get into drug rehab". Emotional support offered to pt as needed throughout this shift. Encouraged patient to voice concerns, attend to ADLs and comply with current treatment regiment including groups. Safety checks maintained without self harm gestures our outburst to note at this time.  Pt tolerates all PO intake well. Observed in dayroom at intervals this afternoon. Went off unit for meals and activities, returned without issues. POC continues for safety and mood stability.

## 2018-12-29 NOTE — Progress Notes (Signed)
D: Pt stayed in room entire evening. Pt keeps to himself and forwards little . A: Pt was offered support and encouragement. . Pt was encourage to attend groups. Q 15 minute checks were done for safety.  R: safety maintained on unit.

## 2018-12-30 MED ORDER — FLUOXETINE HCL 20 MG PO CAPS: 20.0000 mg | ORAL_CAPSULE | Freq: Every day | ORAL | 1 refills | Status: DC

## 2018-12-30 NOTE — Progress Notes (Signed)
Recreation Therapy Notes  Date:  4.3.20 Time: 0930 Location: [address] Dayroom  Group Topic: Stress Management  Goal Area(s) Addresses:  Patient will identify positive stress management techniques. Patient will identify benefits of using stress management post d/c.  Intervention: Stress Management  Activity :  Guided Imagery.  LRT read a script that guided patients in envisioning sitting in a field and enjoying summer clouds.  Patients were to follow along as script was read to participate in meditation.  Education:  Stress Management, Discharge Planning.   Education Outcome: Acknowledges Education  Clinical Observations/Feedback:  Pt did not attend group.     Caroll Rancher, LRT/CTRS        Lillia Abed, Marvell Tamer A 12/30/2018 11:59 AM

## 2018-12-30 NOTE — Progress Notes (Signed)
Adult Psychoeducational Group Note  Date:  12/30/2018 Time:  5:07 AM  Group Topic/Focus:  Wrap-Up Group:   The focus of this group is to help patients review their daily goal of treatment and discuss progress on daily workbooks.  Participation Level:  Minimal  Participation Quality:  Inattentive  Affect:  Blunted  Cognitive:  Lacking  Insight: Lacking  Engagement in Group:  Lacking  Modes of Intervention:  Limit-setting  Additional Comments:  Pt said his day was a 7. The one positive thing he goes home tomorrow.  Charna Busman Long 12/30/2018, 5:07 AM

## 2018-12-30 NOTE — Discharge Summary (Signed)
Physician Discharge Summary Note  Patient:  Edwin Martinez is an 33 y.o., male MRN:  [id number] DOB:  [birth year] Patient phone:  [phone] (home)  Patient address:   [address] [place] [address],  Total Time spent with patient: 45 minutes  Date of Admission:  12/27/2018 Date of Discharge: 12/30/18  Reason for Admission:   Edwin Martinez is known to the service due to prior admissions and encounters this was the latest of numerous, he is homeless individual and there are always issues of secondary gain but he presented reporting need for detox and suicidal thoughts- Drug screen reflecting abuse of benzodiazepines, cocaine and alcohol level was 29 HPI of 4/1 reads as follows This is the latest of multiple admissions and encounters with this healthcare system for Edwin Martinez, 32 year old homeless individual who has numerous comorbidities to include HIV positive status, chronic polysubstance abuse, presenting on 3/31 complaining of suicidal thoughts, requesting inpatient stabilization. Patient makes no eye contact through my exam and gives me vague answers but the bottom line is there clearly issues of secondary gain he is focused on finding long-term rehab for housing purposes, he states that he does not want any medication and later in the interview states his medications are not working and is requesting medications for anxiety. With regards to dangerousness he states he does not have thoughts of harming himself now and he can contract and understands what that means. He states he does not have withdrawal symptoms and does not want medications for detox purposes.  He states he does not have cocaine cravings. Drug screen is positive for cocaine and benzodiazepines and there is alcohol in his system though he is not intoxicated on presentation  Principal Problem: <principal problem not specified> Discharge Diagnoses: Active Problems:   MDD (major depressive disorder), severe  (HCC)     Past Medical History:  Past Medical History:  Diagnosis Date  . ADD (attention deficit disorder)   . Anxiety   . Asthma   . Bipolar 1 disorder (HCC)   . Depression   . HIV (human immunodeficiency virus infection) (HCC) dx'd 2008  . Hypertension   . Insomnia   . Intentional drug overdose (HCC)    Edwin Martinez 02/25/2018  . Polysubstance abuse (HCC)    Edwin Martinez 02/25/2018  . Schizophrenia (HCC)   . Seizures (HCC)    "used to have little black-out szs where I'd drop out for 2-3 min then come back; nothing in the last 2-3-4years" (02/25/2018)  . Shingles     Past Surgical History:  Procedure Laterality Date  . DENTAL SURGERY     "had my eye teeth pulled down"   Family History:  Family History  Problem Relation Age of Onset  . Huntington's disease Father   . Heart disease Mother   . Suicidality Maternal Uncle   . Suicidality Maternal Grandmother   Social History:  Social History   Substance and Sexual Activity  Alcohol Use Yes  . Alcohol/week: 4.0 standard drinks  . Types: 4 Cans of beer per week   Comment: occasionally, "socially"     Social History   Substance and Sexual Activity  Drug Use Yes  . Types: Marijuana, Cocaine   Comment: 10/06/2018 "self medicating occassionally"    Social History   Socioeconomic History  . Marital status: Single    Spouse name: Not on file  . Number of children: Not on file  . Years of education: Not on file  . Highest education level: Not on  file  Occupational History  . Occupation: disability pending  Social Needs  . Financial resource strain: Not on file  . Food insecurity:    Worry: Not on file    Inability: Not on file  . Transportation needs:    Medical: Not on file    Non-medical: Not on file  Tobacco Use  . Smoking status: Current Every Day Smoker    Packs/day: 0.50    Years: 20.00    Pack years: 10.00    Types: Cigarettes    Start date: 09/29/1991  . Smokeless tobacco: Never Used  Substance and Sexual  Activity  . Alcohol use: Yes    Alcohol/week: 4.0 standard drinks    Types: 4 Cans of beer per week    Comment: occasionally, "socially"  . Drug use: Yes    Types: Marijuana, Cocaine    Comment: 10/06/2018 "self medicating occassionally"  . Sexual activity: Yes    Birth control/protection: Condom  Lifestyle  . Physical activity:    Days per week: Not on file    Minutes per session: Not on file  . Stress: Not on file  Relationships  . Social connections:    Talks on phone: Not on file    Gets together: Not on file    Attends religious service: Not on file    Active member of club or organization: Not on file    Attends meetings of clubs or organizations: Not on file    Relationship status: Not on file  Other Topics Concern  . Not on file  Social History Narrative   ** Merged History Encounter **       ** Merged History Encounter **        Hospital Course:    Patient generally stayed in bed did not really participate much at all actually did not get up for groups for the most part he displayed no danger behaviors he told me he did not want any medications for cravings.  By the date of the third he requested discharge home had no rehab arranged but again no thoughts of harming self or others contracting fully no cravings tremors or withdrawal  Physical Findings: AIMS: Facial and Oral Movements Muscles of Facial Expression: None, normal Lips and Perioral Area: None, normal Jaw: None, normal Tongue: None, normal,Extremity Movements Upper (arms, wrists, hands, fingers): None, normal Lower (legs, knees, ankles, toes): None, normal, Trunk Movements Neck, shoulders, hips: None, normal, Overall Severity Severity of abnormal movements (highest score from questions above): None, normal Incapacitation due to abnormal movements: None, normal Patient's awareness of abnormal movements (rate only patient's report): No Awareness, Dental Status Current problems with teeth and/or dentures?:  No Does patient usually wear dentures?: No  CIWA:  CIWA-Ar Total: 1 COWS:  COWS Total Score: 0  Musculoskeletal: Strength & Muscle Tone: within normal limits Gait & Station: normal Patient leans: N/A  Psychiatric Specialty Exam: Physical Exam  ROS  Blood pressure 102/64, pulse 73, temperature 97.8 F (36.6 C), temperature source Oral, resp. rate 18, height 5\' 7"  (1.702 m), weight 64.9 kg.Body mass index is 22.4 kg/m.  General Appearance: Casual  Eye Contact:  minimal  Speech:  Slow  Volume:  Decreased  Mood:  Dysphoric  Affect:  Flat  Thought Process:  Linear  Orientation:  Full (Time, Place, and Person)  Thought Content:  Tangential  Suicidal Thoughts:  No  Homicidal Thoughts:  No  Memory:  Immediate;   Fair  Judgement:  Fair  Insight:  Fair  Psychomotor Activity:  Normal  Concentration:  Concentration: Fair  Recall:  Fair  Fund of Knowledge:  Fair  Language:  Fair  Akathisia:  Negative  Handed:  Right  AIMS (if indicated):     Assets:  Communication Skills Leisure Time  ADL's:  Intact  Cognition:  WNL  Sleep:  Number of Hours: 6.75     Have you used any form of tobacco in the last 30 days? (Cigarettes, Smokeless Tobacco, Cigars, and/or Pipes): Yes  Has this patient used any form of tobacco in the last 30 days? (Cigarettes, Smokeless Tobacco, Cigars, and/or Pipes) Yes, No  Blood Alcohol level:  Lab Results  Component Value Date   ETH 29 (H) 12/27/2018   ETH <10 10/05/2018    Metabolic Disorder Labs:  Lab Results  Component Value Date   HGBA1C 5.6 12/24/2016   MPG 114 12/24/2016   MPG 111 05/04/2016   Lab Results  Component Value Date   PROLACTIN 28.8 (H) 12/24/2016   PROLACTIN 32.3 (H) 08/19/2016   Lab Results  Component Value Date   CHOL 147 08/31/2018   TRIG 189 (H) 08/31/2018   HDL 45 08/31/2018   CHOLHDL 3.3 08/31/2018   VLDL 26 12/24/2016   LDLCALC 74 08/31/2018   LDLCALC 72 04/06/2018    See Psychiatric Specialty Exam and Suicide  Risk Assessment completed by Attending Physician prior to discharge.  Discharge destination:  Home  Is patient on multiple antipsychotic therapies at discharge:  No   Has Patient had three or more failed trials of antipsychotic monotherapy by history:  No  Recommended Plan for Multiple Antipsychotic Therapies: NA   Allergies as of 12/30/2018      Reactions   Magnesium-containing Compounds Other (See Comments)   This medication is contraindicated with pts HIV meds.     Peanut-containing Drug Products Anaphylaxis   Esomeprazole Magnesium Cough   Atripla [efavirenz-emtricitab-tenofovir] Other (See Comments)   Reaction:  Suicidal thoughts    Atripla [efavirenz-emtricitab-tenofovir]    Bactrim [sulfamethoxazole-trimethoprim]    Magnesium-containing Compounds    Nexium [esomeprazole Magnesium]    Peanut-containing Drug Products    Penicillins    Bactrim [sulfamethoxazole-trimethoprim] Rash   Penicillins Rash, Other (See Comments)   Has patient had a PCN reaction causing immediate rash, facial/tongue/throat swelling, SOB or lightheadedness with hypotension: Yes Has patient had a PCN reaction causing severe rash involving mucus membranes or skin necrosis: No Has patient had a PCN reaction that required hospitalization No Has patient had a PCN reaction occurring within the last 10 years: No If all of the above answers are "NO", then may proceed with Cephalosporin use.      Medication List    TAKE these medications     Indication  Biktarvy 50-200-25 MG Tabs tablet Generic drug:  bictegravir-emtricitabine-tenofovir AF TAKE 1 TABLET BY MOUTH DAILY  Indication:  HIV Disease   busPIRone 5 MG tablet Commonly known as:  BUSPAR Take 1 tablet (5 mg total) by mouth 2 (two) times daily. For anxiety  Indication:  Anxiety   citalopram 10 MG tablet Commonly known as:  CELEXA Take 1 tablet (10 mg total) by mouth daily. For mood  Indication:  Mood   FLUoxetine 20 MG capsule Commonly known  as:  PROZAC Take 1 capsule (20 mg total) by mouth daily. Start taking on:  December 31, 2018  Indication:  Depression   gabapentin 100 MG capsule Commonly known as:  NEURONTIN Take 1 capsule (100 mg total) by mouth 3 (three) times daily.  For anxiety/agitation  Indication:  Anxiety/agitation   risperiDONE 1 MG tablet Commonly known as:  RISPERDAL Take 1 tablet (1 mg total) by mouth at bedtime. For hallucinations  Indication:  Psychosis   traZODone 50 MG tablet Commonly known as:  DESYREL Take 1 tablet (50 mg total) by mouth at bedtime as needed for sleep. What changed:  how much to take  Indication:  Trouble Sleeping      Follow-up Information    patient declines Follow up.   Contact information: Patient declines referrals.          SignedMalvin Johns, MD 12/30/2018, 9:41 AM

## 2018-12-30 NOTE — Plan of Care (Signed)
  Problem: Education: Goal: Emotional status will improve Outcome: Adequate for Discharge   Problem: Education: Goal: Mental status will improve Outcome: Adequate for Discharge   Problem: Education: Goal: Verbalization of understanding the information provided will improve Outcome: Adequate for Discharge   Problem: Activity: Goal: Interest or engagement in activities will improve Outcome: Adequate for Discharge   

## 2018-12-30 NOTE — Progress Notes (Signed)
  [facility] Adult Case Management Discharge Plan :  Will you be returning to the same living situation after discharge:  Yes,  "I'm going to sleep on the streets." At discharge, do you have transportation home?: Yes,  bus Do you have the ability to pay for your medications: Yes,  Medicaid.  Release of information consent forms completed and in the chart. Patient to Follow up at: Follow-up Information    patient declines Follow up.   Contact information: Patient declines referrals.          Next level of care provider has access to [facility] Link:no  Safety Planning and Suicide Prevention discussed: Yes,  with patient.  Have you used any form of tobacco in the last 30 days? (Cigarettes, Smokeless Tobacco, Cigars, and/or Pipes): Yes  Has patient been referred to the Quitline?: Patient refused referral  Patient has been referred for addiction treatment: Pt. refused referral  Darreld Mclean, LCSWA 12/30/2018, 9:24 AM

## 2018-12-30 NOTE — Progress Notes (Signed)
Pt did not attend wrap-up group   

## 2018-12-30 NOTE — Progress Notes (Signed)
Discharge note: Patient reviewed discharge paperwork with RN including prescriptions, follow up appointments, and lab work. Patient given the opportunity to ask questions. All concerns were addressed. All belongings were returned to patient. Denied SI/HI/AVH. Patient thanked staff for their care while at the hospital.  Patient was discharged to lobby. 

## 2018-12-30 NOTE — [facility] Suicide Risk Assessment (Signed)
[facility] Discharge Suicide Risk Assessment   Principal Problem: subst abuse secondary gaon  Discharge Diagnoses: Active Problems:   MDD (major depressive disorder), severe (HCC)   Total Time spent with patient: 45 minutes  Baseline no si [id number] No psychosis no cravings Mental Status Per Nursing Assessment::   On Admission:  Suicide plan  Demographic Factors:  Male and Low socioeconomic status  Loss Factors: NA  Historical Factors: NA  Risk Reduction Factors:   Sense of responsibility to family  Continued Clinical Symptoms:  Alcohol/Substance Abuse/Dependencies  Cognitive Features That Contribute To Risk:  None    Suicide Risk:  Minimal: No identifiable suicidal ideation.  Patients presenting with no risk factors but with morbid ruminations; may be classified as minimal risk based on the severity of the depressive symptoms  Follow-up Information    patient declines Follow up.   Contact information: Patient declines referrals.          Plan Of Care/Follow-up recommendations:  Activity:  full  Edwin Jonsson, MD 12/30/2018, 8:37 AM

## 2019-01-17 ENCOUNTER — Other Ambulatory Visit: Payer: Self-pay

## 2019-01-17 ENCOUNTER — Observation Stay (HOSPITAL_COMMUNITY)
Admission: RE | Admit: 2019-01-17 | Discharge: 2019-01-18 | Disposition: A | Discharge status: Home / Self Care | Payer: No Typology Code available for payment source | Attending: Psychiatry | Admitting: Psychiatry

## 2019-01-17 ENCOUNTER — Encounter (HOSPITAL_COMMUNITY): Payer: Self-pay

## 2019-01-17 DIAGNOSIS — F1494 Cocaine use, unspecified with cocaine-induced mood disorder: Secondary | ICD-10-CM | POA: Diagnosis present

## 2019-01-17 DIAGNOSIS — B20 Human immunodeficiency virus [HIV] disease: Secondary | ICD-10-CM | POA: Insufficient documentation

## 2019-01-17 DIAGNOSIS — Z9114 Patient's other noncompliance with medication regimen: Secondary | ICD-10-CM | POA: Insufficient documentation

## 2019-01-17 DIAGNOSIS — F17210 Nicotine dependence, cigarettes, uncomplicated: Secondary | ICD-10-CM | POA: Insufficient documentation

## 2019-01-17 DIAGNOSIS — Z8249 Family history of ischemic heart disease and other diseases of the circulatory system: Secondary | ICD-10-CM | POA: Insufficient documentation

## 2019-01-17 DIAGNOSIS — G4700 Insomnia, unspecified: Secondary | ICD-10-CM | POA: Insufficient documentation

## 2019-01-17 DIAGNOSIS — F1414 Cocaine abuse with cocaine-induced mood disorder: Secondary | ICD-10-CM | POA: Insufficient documentation

## 2019-01-17 DIAGNOSIS — Z79899 Other long term (current) drug therapy: Secondary | ICD-10-CM | POA: Insufficient documentation

## 2019-01-17 DIAGNOSIS — J45909 Unspecified asthma, uncomplicated: Secondary | ICD-10-CM | POA: Insufficient documentation

## 2019-01-17 DIAGNOSIS — F319 Bipolar disorder, unspecified: Secondary | ICD-10-CM | POA: Insufficient documentation

## 2019-01-17 DIAGNOSIS — F209 Schizophrenia, unspecified: Secondary | ICD-10-CM | POA: Insufficient documentation

## 2019-01-17 DIAGNOSIS — R45851 Suicidal ideations: Principal | ICD-10-CM | POA: Insufficient documentation

## 2019-01-17 DIAGNOSIS — F419 Anxiety disorder, unspecified: Secondary | ICD-10-CM | POA: Insufficient documentation

## 2019-01-17 DIAGNOSIS — I10 Essential (primary) hypertension: Secondary | ICD-10-CM | POA: Insufficient documentation

## 2019-01-17 MED ORDER — TRAZODONE HCL 100 MG PO TABS: 100.0000 mg | ORAL_TABLET | Freq: Every evening | ORAL | Status: DC | PRN

## 2019-01-17 MED ORDER — FLUOXETINE HCL 20 MG PO CAPS
20.0000 mg | ORAL_CAPSULE | Freq: Every day | ORAL | Status: DC
  Administered 2019-01-17 – 2019-01-18 (×2): 20 mg via ORAL
  Filled 2019-01-17 (×2): qty 1

## 2019-01-17 MED ORDER — BUSPIRONE HCL 5 MG PO TABS
5.0000 mg | ORAL_TABLET | Freq: Two times a day (BID) | ORAL | Status: DC
  Administered 2019-01-17 – 2019-01-18 (×2): 5 mg via ORAL
  Filled 2019-01-17 (×2): qty 1

## 2019-01-17 MED ORDER — ACETAMINOPHEN 325 MG PO TABS: 650.0000 mg | ORAL_TABLET | Freq: Four times a day (QID) | ORAL | Status: DC | PRN

## 2019-01-17 MED ORDER — LOPERAMIDE HCL 2 MG PO CAPS: 2.0000 mg | ORAL_CAPSULE | ORAL | Status: DC | PRN

## 2019-01-17 MED ORDER — VITAMIN B-1 100 MG PO TABS
100.0000 mg | ORAL_TABLET | Freq: Every day | ORAL | Status: DC
  Administered 2019-01-18: 100 mg via ORAL
  Filled 2019-01-17: qty 1

## 2019-01-17 MED ORDER — THIAMINE HCL 100 MG/ML IJ SOLN: 100.0000 mg | Freq: Once | INTRAMUSCULAR | Status: DC

## 2019-01-17 MED ORDER — BICTEGRAVIR-EMTRICITAB-TENOFOV 50-200-25 MG PO TABS
1.0000 | ORAL_TABLET | Freq: Every day | ORAL | Status: DC
  Administered 2019-01-18: 09:00:00 1 via ORAL
  Filled 2019-01-17 (×2): qty 1

## 2019-01-17 MED ORDER — ONDANSETRON 4 MG PO TBDP: 4.0000 mg | ORAL_TABLET | Freq: Four times a day (QID) | ORAL | Status: DC | PRN

## 2019-01-17 MED ORDER — HYDROXYZINE HCL 25 MG PO TABS: 25.0000 mg | ORAL_TABLET | Freq: Four times a day (QID) | ORAL | Status: DC | PRN

## 2019-01-17 MED ORDER — RISPERIDONE 1 MG PO TABS
1.0000 mg | ORAL_TABLET | Freq: Every day | ORAL | Status: DC
  Administered 2019-01-18: 1 mg via ORAL
  Filled 2019-01-17: qty 1

## 2019-01-17 MED ORDER — CITALOPRAM HYDROBROMIDE 10 MG PO TABS
10.0000 mg | ORAL_TABLET | Freq: Every day | ORAL | Status: DC
  Administered 2019-01-17 – 2019-01-18 (×2): 10 mg via ORAL
  Filled 2019-01-17 (×2): qty 1

## 2019-01-17 MED ORDER — TRAZODONE HCL 50 MG PO TABS: 50.0000 mg | ORAL_TABLET | Freq: Every evening | ORAL | Status: DC | PRN

## 2019-01-17 MED ORDER — LORAZEPAM 1 MG PO TABS: 1.0000 mg | ORAL_TABLET | Freq: Four times a day (QID) | ORAL | Status: DC | PRN

## 2019-01-17 MED ORDER — IBUPROFEN 600 MG PO TABS: 600.0000 mg | ORAL_TABLET | Freq: Four times a day (QID) | ORAL | Status: DC | PRN

## 2019-01-17 MED ORDER — GABAPENTIN 100 MG PO CAPS
100.0000 mg | ORAL_CAPSULE | Freq: Three times a day (TID) | ORAL | Status: DC
  Administered 2019-01-18 (×2): 100 mg via ORAL
  Filled 2019-01-17 (×2): qty 1
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
## 2019-01-17 NOTE — H&P (Signed)
Behavioral Health Medical Screening Exam  Edwin Martinez is an 33 y.o. male patient parents to [facility] as walk in with complaints of being off of his medication for 3 weeks, hearing voices, and suicidal ideation.  Patient is unable to contract for safety "I'm telling you.  I need help; if I leave out of here I'm going to kill myself.  I don't know how yet but I am going to kill myself if I don't get help.  I need to get back on my medication so I can stabilize myself."   Patient reports alcohol and cocaine abuse but primary problem is crack cocaine.  Reports last done crack cocaine yesterday; but patient appears to be under the influence of a substance at time of this assessment.    Total Time spent with patient: 30 minutes  Psychiatric Specialty Exam: Physical Exam  Vitals reviewed. Constitutional: He is oriented to person, place, and time.  Neck: Normal range of motion.  Respiratory: Effort normal.  Musculoskeletal: Normal range of motion.  Neurological: He is alert and oriented to person, place, and time.  Skin: Skin is warm and dry.    Review of Systems  Constitutional:       HIV positive   Psychiatric/Behavioral: Positive for depression, hallucinations, substance abuse and suicidal ideas. The patient is nervous/anxious.   All other systems reviewed and are negative.   Blood pressure 112/73, pulse 96, temperature 98.4 F (36.9 C), temperature source Oral, resp. rate 18, SpO2 98 %.There is no height or weight on file to calculate BMI.  General Appearance: Casual  Eye Contact:  Good  Speech:  Clear and Coherent and Normal Rate  Volume:  Increased  Mood:  Anxious, Depressed and Hopeless  Affect:  Depressed and Tearful  Thought Process:  Coherent and Goal Directed  Orientation:  Full (Time, Place, and Person)  Thought Content:  Hallucinations: Auditory, Paranoid Ideation and Rumination  Suicidal Thoughts:  Yes.  with intent/plan  Homicidal Thoughts:  No  Memory:  Immediate;    Fair Recent;   Fair Remote;   Fair  Judgement:  Impaired  Insight:  Lacking and Shallow  Psychomotor Activity:  Restlessness  Concentration: Concentration: Fair and Attention Span: Fair  Recall:  [employer] of Knowledge:Fair  Language: Good  Akathisia:  No  Handed:  Right  AIMS (if indicated):     Assets:  Communication Skills Desire for Improvement  Sleep:       Musculoskeletal: Strength & Muscle Tone: within normal limits Gait & Station: normal Patient leans: N/A  Blood pressure 112/73, pulse 96, temperature 98.4 F (36.9 C), temperature source Oral, resp. rate 18, SpO2 98 %.  Recommendations:  Admission to observation.  Reassess tomorrow after patient has calm down or no longer under the influence  Based on my evaluation the patient does not appear to have an emergency medical condition.  Korine Winton, NP 01/17/2019, 6:37 PM

## 2019-01-17 NOTE — Progress Notes (Signed)
Edwin Martinez is a 33 year old male being admitted voluntarily to the OBS unit.  He came in with suicidal ideation, crack addiction, paranoia and auditory hallucinations.  During admission process, he continues to report suicidal ideation and verbally agrees to not harm himself on the unit.  He denied HI or visual hallucinations.  He continues to report auditory hallucinations and was talking responding to internal stimuli during interaction.  Oriented him to the unit.  BH-OBS paperwork completed and signed.  Belongings secured in tamper resistant bag and placed in locker # 21 and 24.  No contraband found.  Skin assessment completed and noted multiple tattoos but no other skin issues noted.  Q 15 minute checks initiated for safety.

## 2019-01-17 NOTE — Plan of Care (Signed)
[facility] Observation Crisis Plan  Reason for Crisis Plan:  Chronic Mental Illness/Medical Illness, Crisis Stabilization, Medication Management and Substance Abuse   Plan of Care:  Referral for Inpatient Hospitalization and Referral for Substance Abuse  Family Support:      Current Living Environment:  Living Arrangements: Alone  Insurance:   Hospital Account    Name Acct ID Class Status Primary Coverage   [place], [place] [id number] BEHAVIORAL HEALTH OBSERVATION [facility] FOR MH/DD/SAS - [place] [employer]        Guarantor Account (for Hospital Account [id number])    Name Relation to Pt Service Area Active? Acct Type   [name] Self [facility] Yes Behavioral Health   Address Phone       [address] [place], [place] [address] [phone])          Coverage Information (for Hospital Account [id number])    F/O Payor/Plan Precert #   [facility] FOR MH/DD/SAS/[place] [employer]    Subscriber Subscriber #   Felix, [place] [id number]   Address Phone   [address] [place], [place] [address] [phone]      Legal Guardian:  Legal Guardian: (self)  Primary Care Provider:  Judyann Munson, MD  Current Outpatient Providers:  none  Psychiatrist:  Name of Psychiatrist: none  Counselor/Therapist:  Name of Therapist: none  Compliant with Medications:  No  Additional Information:   Levin Bacon 4/21/20208:27 PM

## 2019-01-18 ENCOUNTER — Ambulatory Visit (HOSPITAL_COMMUNITY): Payer: Self-pay

## 2019-01-18 DIAGNOSIS — F3289 Other specified depressive episodes: Secondary | ICD-10-CM

## 2019-01-18 DIAGNOSIS — F1494 Cocaine use, unspecified with cocaine-induced mood disorder: Secondary | ICD-10-CM

## 2019-01-18 LAB — HEMOGLOBIN A1C
Hgb A1c MFr Bld: 5.7 % — ABNORMAL HIGH (ref 4.8–5.6)
Mean Plasma Glucose: 116.89 mg/dL

## 2019-01-18 LAB — COMPREHENSIVE METABOLIC PANEL
ALT: 104 U/L — ABNORMAL HIGH (ref 0–44)
AST: 75 U/L — ABNORMAL HIGH (ref 15–41)
Albumin: 3.8 g/dL (ref 3.5–5.0)
Alkaline Phosphatase: 78 U/L (ref 38–126)
Anion gap: 7 (ref 5–15)
BUN: 11 mg/dL (ref 6–20)
CO2: 29 mmol/L (ref 22–32)
Calcium: 9.2 mg/dL (ref 8.9–10.3)
Chloride: 103 mmol/L (ref 98–111)
Creatinine, Ser: 0.78 mg/dL (ref 0.61–1.24)
GFR calc Af Amer: >60 mL/min (ref >60)
GFR calc non Af Amer: >60 mL/min (ref >60)
Glucose, Bld: 91 mg/dL (ref 70–99)
Potassium: 4 mmol/L (ref 3.5–5.1)
Sodium: 139 mmol/L (ref 135–145)
Total Bilirubin: 0.7 mg/dL (ref 0.3–1.2)
Total Protein: 7 g/dL (ref 6.5–8.1)

## 2019-01-18 LAB — CBC
HCT: 48.6 % (ref 39.0–52.0)
Hemoglobin: 15.7 g/dL (ref 13.0–17.0)
MCH: 28.8 pg (ref 26.0–34.0)
MCHC: 32.3 g/dL (ref 30.0–36.0)
MCV: 89.2 fL (ref 80.0–100.0)
Platelets: 250 10*3/uL (ref 150–400)
RBC: 5.45 MIL/uL (ref 4.22–5.81)
RDW: 14.3 % (ref 11.5–15.5)
WBC: 7.2 10*3/uL (ref 4.0–10.5)
nRBC: 0 % (ref 0.0–0.2)

## 2019-01-18 LAB — RAPID URINE DRUG SCREEN, HOSP PERFORMED
Amphetamines: NOT DETECTED
Barbiturates: NOT DETECTED
Benzodiazepines: NOT DETECTED
Cocaine: POSITIVE — AB
Opiates: NOT DETECTED
Tetrahydrocannabinol: NOT DETECTED

## 2019-01-18 LAB — URINALYSIS, COMPLETE (UACMP) WITH MICROSCOPIC
Bacteria, UA: NONE SEEN
Bilirubin Urine: NEGATIVE
Glucose, UA: NEGATIVE mg/dL
Hgb urine dipstick: NEGATIVE
Ketones, ur: NEGATIVE mg/dL
Leukocytes,Ua: NEGATIVE
Nitrite: NEGATIVE
Protein, ur: NEGATIVE mg/dL
Specific Gravity, Urine: 1.017 (ref 1.005–1.030)
pH: 6 (ref 5.0–8.0)

## 2019-01-18 MED ORDER — FLUOXETINE HCL 20 MG PO CAPS: 20.0000 mg | ORAL_CAPSULE | Freq: Every day | ORAL | 0 refills | Status: DC

## 2019-01-18 MED ORDER — CITALOPRAM HYDROBROMIDE 10 MG PO TABS: 10.0000 mg | ORAL_TABLET | Freq: Every day | ORAL | 0 refills | Status: DC

## 2019-01-18 MED ORDER — TRAZODONE HCL 50 MG PO TABS: 50.0000 mg | ORAL_TABLET | Freq: Every evening | ORAL | 0 refills | Status: DC | PRN

## 2019-01-18 MED ORDER — RISPERIDONE 1 MG PO TABS: 1.0000 mg | ORAL_TABLET | Freq: Every day | ORAL | 0 refills | Status: DC

## 2019-01-18 MED ORDER — BUSPIRONE HCL 5 MG PO TABS: 5.0000 mg | ORAL_TABLET | Freq: Two times a day (BID) | ORAL | 0 refills | Status: DC

## 2019-01-18 MED ORDER — GABAPENTIN 100 MG PO CAPS: 100.0000 mg | ORAL_CAPSULE | Freq: Three times a day (TID) | ORAL | 0 refills | Status: DC

## 2019-01-18 NOTE — [facility] Suicide Risk Assessment (Signed)
[facility] Discharge Suicide Risk Assessment   Principal Problem: Cocaine-induced mood disorder [facility]) Discharge Diagnoses: Principal Problem:   Cocaine-induced mood disorder (HCC)   Total Time spent with patient: 30 minutes  Musculoskeletal: Strength & Muscle Tone: within normal limits Gait & Station: UTA since patient is lying in bed. Patient leans: N/A  Psychiatric Specialty Exam: Review of Systems  Psychiatric/Behavioral: Positive for depression and substance abuse.  All other systems reviewed and are negative.   Blood pressure 139/84, pulse 93, temperature 98.5 F (36.9 C), temperature source Oral, resp. rate 18, height 5\' 7"  (1.702 m), weight 64.9 kg, SpO2 99 %.Body mass index is 22.4 kg/m.  General Appearance: Fairly Groomed, young, African American male who is lying in bed under the covers. NAD.  Eye Contact::  Good  Speech:  Clear and Coherent and Normal Rate  Volume:  Normal  Mood:  Dysphoric  Affect:  Constricted  Thought Process:  Linear and Descriptions of Associations: Intact  Orientation:  Full (Time, Place, and Person)  Thought Content:  Logical  Suicidal Thoughts:  No  Homicidal Thoughts:  No  Memory:  Immediate;   Good Recent;   Good Remote;   Good  Judgement:  Fair  Insight:  Fair  Psychomotor Activity:  Normal  Concentration:  Good  Recall:  Good  Fund of Knowledge:Good  Language: Good  Akathisia:  No  Handed:  Right  AIMS (if indicated):   N/A  Assets:  Communication Skills Desire for Improvement Resilience  Sleep:   N/A  Cognition: WNL  ADL's:  Intact   Mental Status Per Nursing Assessment::   On Admission:  Self-harm thoughts, Suicide plan  Demographic Factors:  Male, Low socioeconomic status, Living alone and Unemployed  Loss Factors: Financial problems/change in socioeconomic status  Historical Factors: Prior suicide attempts, Family history of suicide and Family history of mental illness or substance abuse  Risk Reduction Factors:    NA  Continued Clinical Symptoms:  Alcohol/Substance Abuse/Dependencies More than one psychiatric diagnosis Previous Psychiatric Diagnoses and Treatments Medical Diagnoses and Treatments/Surgeries  Cognitive Features That Contribute To Risk:  None    Suicide Risk:  Minimal: No identifiable suicidal ideation.  Patients presenting with no risk factors but with morbid ruminations; may be classified as minimal risk based on the severity of the depressive symptoms    Plan Of Care/Follow-up recommendations:  -Follow up with outpatient provider. -Continue psychotropic medication regimen.    Cherly Beach, DO 01/18/2019, 12:18 PM

## 2019-01-18 NOTE — Discharge Summary (Addendum)
Physician Discharge Summary Note  Patient:  Edwin Martinez is an 33 y.o., male MRN:  [id number] DOB:  September 21, 1986 Patient phone:  [phone] (home)  Patient address:   [address] [place] [id number],  Total Time spent with patient: 30 minutes  Date of Admission:  01/17/2019 Date of Discharge: 01/18/19   Reason for Admission:  SI and paranoia.   Principal Problem: Cocaine-induced mood disorder [facility](HCC) Discharge Diagnoses: Principal Problem:   Cocaine-induced mood disorder (HCC)   Past Psychiatric History: Schizophrenia, BPAD, polysubstance abuse and anxiety.   Past Medical History:  Past Medical History:  Diagnosis Date  . ADD (attention deficit disorder)   . Anxiety   . Asthma   . Bipolar 1 disorder (HCC)   . Depression   . HIV (human immunodeficiency virus infection) (HCC) dx'd 2008  . Hypertension   . Insomnia   . Intentional drug overdose (HCC)    [name]/notes 02/25/2018  . Polysubstance abuse (HCC)    [name]/notes 02/25/2018  . Schizophrenia (HCC)   . Seizures (HCC)    "used to have little black-out szs where I'd drop out for 2-3 min then come back; nothing in the last 2-3-4years" (02/25/2018)  . Shingles     Past Surgical History:  Procedure Laterality Date  . DENTAL SURGERY     "had my eye teeth pulled down"   Family History:  Family History  Problem Relation Age of Onset  . Huntington's disease Father   . Heart disease Mother   . Suicidality Maternal Uncle   . Suicidality Maternal Grandmother    Family Psychiatric  History: As listed above.  Social History:  Social History   Substance and Sexual Activity  Alcohol Use Yes  . Alcohol/week: 4.0 standard drinks  . Types: 4 Cans of beer per week   Comment: occasionally, "socially"     Social History   Substance and Sexual Activity  Drug Use Yes  . Types: Marijuana, Cocaine   Comment: 10/06/2018 "self medicating occassionally"    Social History   Socioeconomic History  . Marital status: Single     Spouse name: Not on file  . Number of children: Not on file  . Years of education: Not on file  . Highest education level: Not on file  Occupational History  . Occupation: disability pending  Social Needs  . Financial resource strain: Not on file  . Food insecurity:    Worry: Not on file    Inability: Not on file  . Transportation needs:    Medical: Not on file    Non-medical: Not on file  Tobacco Use  . Smoking status: Current Every Day Smoker    Packs/day: 0.50    Years: 20.00    Pack years: 10.00    Types: Cigarettes    Start date: 09/29/1991  . Smokeless tobacco: Never Used  Substance and Sexual Activity  . Alcohol use: Yes    Alcohol/week: 4.0 standard drinks    Types: 4 Cans of beer per week    Comment: occasionally, "socially"  . Drug use: Yes    Types: Marijuana, Cocaine    Comment: 10/06/2018 "self medicating occassionally"  . Sexual activity: Yes    Birth control/protection: Condom  Lifestyle  . Physical activity:    Days per week: Not on file    Minutes per session: Not on file  . Stress: Not on file  Relationships  . Social connections:    Talks on phone: Not on file  Gets together: Not on file    Attends religious service: Not on file    Active member of club or organization: Not on file    Attends meetings of clubs or organizations: Not on file    Relationship status: Not on file  Other Topics Concern  . Not on file  Social History Narrative   ** Merged History Encounter **       ** Merged History Encounter **        Hospital Course:   [name] was admitted with SI and paranoia. He was observed overnight without any safety concerns. Today he reports that his primary concern is substance abuse and he would like long term rehab. He is able to safety plan and has no intention of harming himself.   Physical Findings: AIMS: Facial and Oral Movements Muscles of Facial Expression: None, normal Lips and Perioral Area: None, normal Jaw: None,  normal Tongue: None, normal,Extremity Movements Upper (arms, wrists, hands, fingers): None, normal Lower (legs, knees, ankles, toes): None, normal, Trunk Movements Neck, shoulders, hips: None, normal, Overall Severity Severity of abnormal movements (highest score from questions above): None, normal Incapacitation due to abnormal movements: None, normal Patient's awareness of abnormal movements (rate only patient's report): No Awareness, Dental Status Current problems with teeth and/or dentures?: No Does patient usually wear dentures?: No  CIWA:  CIWA-Ar Total: 0 COWS:   N/A  Musculoskeletal: Strength & Muscle Tone: within normal limits Gait & Station: UTA since patient is lying in bed. Patient leans: N/A  Psychiatric Specialty Exam: Physical Exam  Nursing note and vitals reviewed. Constitutional: He is oriented to person, place, and time. He appears well-developed and well-nourished.  HENT:  Head: Normocephalic and atraumatic.  Neck: Normal range of motion.  Respiratory: Effort normal.  Musculoskeletal: Normal range of motion.  Neurological: He is alert and oriented to person, place, and time.  Psychiatric: He has a normal mood and affect. His speech is normal and behavior is normal. Judgment and thought content normal. Cognition and memory are normal.    Review of Systems  Psychiatric/Behavioral: Positive for depression and substance abuse.  All other systems reviewed and are negative.   Blood pressure 118/79, pulse 77, temperature 97.9 F (36.6 C), resp. rate 16, height 5\' 7"  (1.702 m), weight 64.9 kg, SpO2 98 %.Body mass index is 22.4 kg/m.  General Appearance: Fairly Groomed, young, African American male who is lying in bed under the covers. NAD.  Eye Contact:  Good  Speech:  Clear and Coherent and Normal Rate  Volume:  Normal  Mood:  Dysphoric  Affect:  Constricted  Thought Process:  Linear and Descriptions of Associations: Intact  Orientation:  Full (Time, Place, and  Person)  Thought Content:  Logical  Suicidal Thoughts:  No  Homicidal Thoughts:  No  Memory:  Immediate;   Good Recent;   Good Remote;   Good  Judgement:  Fair  Insight:  Fair  Psychomotor Activity:  Normal  Concentration:  Concentration: Good and Attention Span: Good  Recall:  Good  Fund of Knowledge:  Good  Language:  Good  Akathisia:  No  Handed:  Right  AIMS (if indicated):   N/A  Assets:  Communication Skills Desire for Improvement Resilience  ADL's:  Intact  Cognition:  WNL  Sleep:   N/A     Have you used any form of tobacco in the last 30 days? (Cigarettes, Smokeless Tobacco, Cigars, and/or Pipes): Yes  Has this patient used any form of  tobacco in the last 30 days? (Cigarettes, Smokeless Tobacco, Cigars, and/or Pipes) Yes, N/A  Blood Alcohol level:  Lab Results  Component Value Date   ETH 29 (H) 12/27/2018   ETH <10 10/05/2018    Metabolic Disorder Labs:  Lab Results  Component Value Date   HGBA1C 5.7 (H) 01/18/2019   MPG 116.89 01/18/2019   MPG 114 12/24/2016   Lab Results  Component Value Date   PROLACTIN 28.8 (H) 12/24/2016   PROLACTIN 32.3 (H) 08/19/2016   Lab Results  Component Value Date   CHOL 147 08/31/2018   TRIG 189 (H) 08/31/2018   HDL 45 08/31/2018   CHOLHDL 3.3 08/31/2018   VLDL 26 12/24/2016   LDLCALC 74 08/31/2018   LDLCALC 72 04/06/2018    See Psychiatric Specialty Exam and Suicide Risk Assessment completed by Attending Physician prior to discharge.  Discharge destination:  Home  Is patient on multiple antipsychotic therapies at discharge:  No   Has Patient had three or more failed trials of antipsychotic monotherapy by history:  No  Recommended Plan for Multiple Antipsychotic Therapies: NA  Discharge Instructions    Diet - low sodium heart healthy   Complete by:  As directed    Increase activity slowly   Complete by:  As directed      Allergies as of 01/18/2019      Reactions   Magnesium-containing Compounds Other  (See Comments)   This medication is contraindicated with pts HIV meds.     Peanut-containing Drug Products Anaphylaxis   Esomeprazole Magnesium Cough   Atripla [efavirenz-emtricitab-tenofovir] Other (See Comments)   Reaction:  Suicidal thoughts    Atripla [efavirenz-emtricitab-tenofovir]    Bactrim [sulfamethoxazole-trimethoprim]    Magnesium-containing Compounds    Nexium [esomeprazole Magnesium]    Peanut-containing Drug Products    Penicillins    Bactrim [sulfamethoxazole-trimethoprim] Rash   Penicillins Rash, Other (See Comments)   Has patient had a PCN reaction causing immediate rash, facial/tongue/throat swelling, SOB or lightheadedness with hypotension: Yes Has patient had a PCN reaction causing severe rash involving mucus membranes or skin necrosis: No Has patient had a PCN reaction that required hospitalization No Has patient had a PCN reaction occurring within the last 10 years: No If all of the above answers are "NO", then may proceed with Cephalosporin use.      Medication List    TAKE these medications     Indication  Biktarvy 50-200-25 MG Tabs tablet Generic drug:  bictegravir-emtricitabine-tenofovir AF TAKE 1 TABLET BY MOUTH DAILY  Indication:  HIV Disease   busPIRone 5 MG tablet Commonly known as:  BUSPAR Take 1 tablet (5 mg total) by mouth 2 (two) times daily. For anxiety  Indication:  Anxiety   citalopram 10 MG tablet Commonly known as:  CELEXA Take 1 tablet (10 mg total) by mouth daily. For mood  Indication:  Mood   FLUoxetine 20 MG capsule Commonly known as:  PROZAC Take 1 capsule (20 mg total) by mouth daily.  Indication:  Depression   gabapentin 100 MG capsule Commonly known as:  NEURONTIN Take 1 capsule (100 mg total) by mouth 3 (three) times daily. For anxiety/agitation  Indication:  Anxiety/agitation   risperiDONE 1 MG tablet Commonly known as:  RISPERDAL Take 1 tablet (1 mg total) by mouth at bedtime. For hallucinations  Indication:   Psychosis   traZODone 50 MG tablet Commonly known as:  DESYREL Take 1 tablet (50 mg total) by mouth at bedtime as needed for sleep. What changed:  how  much to take  Indication:  Trouble Sleeping         Follow-up recommendations:  Follow up with outpatient provider.   Comments:  N/A  Signed: Cherly Beach, DO 01/18/2019, 12:06 PM

## 2019-01-18 NOTE — H&P (Addendum)
BH Observation Unit Provider Admission PAA/H&P  Patient Identification: Edwin Martinez MRN:  [id number] Date of Evaluation:  01/18/2019 Chief Complaint:  Suicidal ideations Principal Diagnosis: Cocaine-induced mood disorder (HCC) Diagnosis:  Active Problems:   Cocaine-induced mood disorder (HCC)  History of Present Illness:  Edwin Martinez presented to the hospital with SI. He reports SI for the past 3 days and "extreme" paranoia. He does not appear to be responding to internal stimuli. He has not been taking his medications for 2-3 weeks because someone stole his bag.  He denies HI or AVH. He reports that he would like "long term rehab" for crack cocaine use. He reports, "That's why I came to the hospital." He is able to safety plan.    Associated Signs/Symptoms: Depression Symptoms:  depressed mood, (Hypo) Manic Symptoms:  N/A Anxiety Symptoms:  N/A Psychotic Symptoms:  Paranoia, mild PTSD Symptoms: NA Total Time spent with patient: 30 minutes  Past Psychiatric History: Schizophrenia, BPAD, polysubstance abuse and anxiety.   Is the patient at risk to self? No.  Has the patient been a risk to self in the past 6 months? No.  Has the patient been a risk to self within the distant past? Yes.    Is the patient a risk to others? No.  Has the patient been a risk to others in the past 6 months? No.  Has the patient been a risk to others within the distant past? No.   Prior Inpatient Therapy: Prior Inpatient Therapy: Yes Prior Therapy Dates: 09/2018, 02/2018 Prior Therapy Facilty/Provider(s): [facility] Reason for Treatment: Si and SA Prior Outpatient Therapy: Prior Outpatient Therapy: No Does patient have an ACCT team?: No Does patient have Intensive In-House Services?  : No Does patient have [name] services? : No Does patient have P4CC services?: No  Alcohol Screening: 1. How often do you have a drink containing alcohol?: 2 to 4 times a month 2. How many drinks containing alcohol  do you have on a typical day when you are drinking?: 3 or 4 3. How often do you have six or more drinks on one occasion?: Less than monthly AUDIT-C Score: 4 4. How often during the last year have you found that you were not able to stop drinking once you had started?: Never 5. How often during the last year have you failed to do what was normally expected from you becasue of drinking?: Less than monthly 6. How often during the last year have you needed a first drink in the morning to get yourself going after a heavy drinking session?: Never 7. How often during the last year have you had a feeling of guilt of remorse after drinking?: Never 8. How often during the last year have you been unable to remember what happened the night before because you had been drinking?: Less than monthly 9. Have you or someone else been injured as a result of your drinking?: No 10. Has a relative or friend or a doctor or another health worker been concerned about your drinking or suggested you cut down?: No Alcohol Use Disorder Identification Test Final Score (AUDIT): 6 Alcohol Brief Interventions/Follow-up: AUDIT Score <7 follow-up not indicated Substance Abuse History in the last 12 months:  Yes.   Consequences of Substance Abuse: Poor treatment compliance Previous Psychotropic Medications: Yes  Psychological Evaluations: No  Past Medical History:  Past Medical History:  Diagnosis Date  . ADD (attention deficit disorder)   . Anxiety   . Asthma   . Bipolar 1  disorder (HCC)   . Depression   . HIV (human immunodeficiency virus infection) (HCC) dx'd 2008  . Hypertension   . Insomnia   . Intentional drug overdose (HCC)    Edwin Martinez/notes 02/25/2018  . Polysubstance abuse (HCC)    Edwin Martinez/notes 02/25/2018  . Schizophrenia (HCC)   . Seizures (HCC)    "used to have little black-out szs where I'd drop out for 2-3 min then come back; nothing in the last 2-3-4years" (02/25/2018)  . Shingles     Past Surgical History:   Procedure Laterality Date  . DENTAL SURGERY     "had my eye teeth pulled down"   Family History:  Family History  Problem Relation Age of Onset  . Huntington's disease Father   . Heart disease Mother   . Suicidality Maternal Uncle   . Suicidality Maternal Grandmother    Family Psychiatric History: As listed above.  Tobacco Screening: Have you used any form of tobacco in the last 30 days? (Cigarettes, Smokeless Tobacco, Cigars, and/or Pipes): Yes Tobacco use, Select all that apply: 5 or more cigarettes per day Are you interested in Tobacco Cessation Medications?: No, patient refused Counseled patient on smoking cessation including recognizing danger situations, developing coping skills and basic information about quitting provided: Refused/Declined practical counseling Social History:  Social History   Substance and Sexual Activity  Alcohol Use Yes  . Alcohol/week: 4.0 standard drinks  . Types: 4 Cans of beer per week   Comment: occasionally, "socially"     Social History   Substance and Sexual Activity  Drug Use Yes  . Types: Marijuana, Cocaine   Comment: 10/06/2018 "self medicating occassionally"    Additional Social History: Marital status: Single    Pain Medications: see MAR Prescriptions: see MAR Over the Counter: see MAR History of alcohol / drug use?: Yes Longest period of sobriety (when/how long): [place] Name of Substance 1: crack cocaine 1 - Age of First Use: 15 1 - Amount (size/oz): 20-40 dollars worth 1 - Frequency: daily 1 - Duration: 17 years 1 - Last Use / Amount: 01/16/2019                  Allergies:   Allergies  Allergen Reactions  . Magnesium-Containing Compounds Other (See Comments)    This medication is contraindicated with pts HIV meds.    . Peanut-Containing Drug Products Anaphylaxis  . Esomeprazole Magnesium Cough  . Atripla [Efavirenz-Emtricitab-Tenofovir] Other (See Comments)    Reaction:  Suicidal thoughts   . Atripla  [Efavirenz-Emtricitab-Tenofovir]   . Bactrim [Sulfamethoxazole-Trimethoprim]   . Magnesium-Containing Compounds   . Nexium [Esomeprazole Magnesium]   . Peanut-Containing Drug Products   . Penicillins   . Bactrim [Sulfamethoxazole-Trimethoprim] Rash  . Penicillins Rash and Other (See Comments)    Has patient had a PCN reaction causing immediate rash, facial/tongue/throat swelling, SOB or lightheadedness with hypotension: Yes Has patient had a PCN reaction causing severe rash involving mucus membranes or skin necrosis: No Has patient had a PCN reaction that required hospitalization No Has patient had a PCN reaction occurring within the last 10 years: No If all of the above answers are "NO", then may proceed with Cephalosporin use.   Lab Results:  Results for orders placed or performed during the hospital encounter of 01/17/19 (from the past 48 hour(s))  Urinalysis, Complete w Microscopic     Status: None   Collection Time: 01/17/19  7:06 PM  Result Value Ref Range   Color, Urine YELLOW YELLOW   APPearance CLEAR  CLEAR   Specific Gravity, Urine 1.017 1.005 - 1.030   pH 6.0 5.0 - 8.0   Glucose, UA NEGATIVE NEGATIVE mg/dL   Hgb urine dipstick NEGATIVE NEGATIVE   Bilirubin Urine NEGATIVE NEGATIVE   Ketones, ur NEGATIVE NEGATIVE mg/dL   Protein, ur NEGATIVE NEGATIVE mg/dL   Nitrite NEGATIVE NEGATIVE   Leukocytes,Ua NEGATIVE NEGATIVE   RBC / HPF 0-5 0 - 5 RBC/hpf   WBC, UA 0-5 0 - 5 WBC/hpf   Bacteria, UA NONE SEEN NONE SEEN   Mucus PRESENT     Comment: Performed at [facility], [address], [place], [place] [address]  Urine rapid drug screen (hosp performed)not at [facility]     Status: Abnormal   Collection Time: 01/17/19  7:06 PM  Result Value Ref Range   Opiates NONE DETECTED NONE DETECTED   Cocaine POSITIVE (A) NONE DETECTED   Benzodiazepines NONE DETECTED NONE DETECTED   Amphetamines NONE DETECTED NONE DETECTED   Tetrahydrocannabinol NONE DETECTED NONE DETECTED    Barbiturates NONE DETECTED NONE DETECTED    Comment: (NOTE) DRUG SCREEN FOR MEDICAL PURPOSES ONLY.  IF CONFIRMATION IS NEEDED FOR ANY PURPOSE, NOTIFY LAB WITHIN 5 DAYS. LOWEST DETECTABLE LIMITS FOR URINE DRUG SCREEN Drug Class                     Cutoff (ng/mL) Amphetamine and metabolites    1000 Barbiturate and metabolites    200 Benzodiazepine                 200 Tricyclics and metabolites     300 Opiates and metabolites        300 Cocaine and metabolites        300 THC                            50 Performed at [facility], [address], [place], [place] [address]   CBC     Status: None   Collection Time: 01/18/19  7:02 AM  Result Value Ref Range   WBC 7.2 4.0 - 10.5 K/uL   RBC 5.45 4.22 - 5.81 MIL/uL   Hemoglobin 15.7 13.0 - 17.0 g/dL   HCT 09.8 11.9 - 14.7 %   MCV 89.2 80.0 - 100.0 fL   MCH 28.8 26.0 - 34.0 pg   MCHC 32.3 30.0 - 36.0 g/dL   RDW 82.9 56.2 - 13.0 %   Platelets 250 150 - 400 K/uL   nRBC 0.0 0.0 - 0.2 %    Comment: Performed at [facility], [address], [place], [place] [address]  Comprehensive metabolic panel     Status: Abnormal   Collection Time: 01/18/19  7:02 AM  Result Value Ref Range   Sodium 139 135 - 145 mmol/L   Potassium 4.0 3.5 - 5.1 mmol/L   Chloride 103 98 - 111 mmol/L   CO2 29 22 - 32 mmol/L   Glucose, Bld 91 70 - 99 mg/dL   BUN 11 6 - 20 mg/dL   Creatinine, Ser 4.69 0.61 - 1.24 mg/dL   Calcium 9.2 8.9 - 62.9 mg/dL   Total Protein 7.0 6.5 - 8.1 g/dL   Albumin 3.8 3.5 - 5.0 g/dL   AST 75 (H) 15 - 41 U/L   ALT 104 (H) 0 - 44 U/L   Alkaline Phosphatase 78 38 - 126 U/L   Total Bilirubin 0.7 0.3 - 1.2  mg/dL   GFR calc non Af Amer >60 >60 mL/min   GFR calc Af Amer >60 >60 mL/min   Anion gap 7 5 - 15    Comment: Performed at [facility], [address] [place], [place] [address]  Hemoglobin A1c     Status: Abnormal   Collection Time: 01/18/19  7:02 AM  Result Value  Ref Range   Hgb A1c MFr Bld 5.7 (H) 4.8 - 5.6 %    Comment: (NOTE) Pre diabetes:          5.7%-6.4% Diabetes:              >6.4% Glycemic control for   <7.0% adults with diabetes    Mean Plasma Glucose 116.89 mg/dL    Comment: Performed at [facility], [address] [place], [place] [address]    Blood Alcohol level:  Lab Results  Component Value Date   ETH 29 (H) 12/27/2018   ETH <10 10/05/2018    Metabolic Disorder Labs:  Lab Results  Component Value Date   HGBA1C 5.7 (H) 01/18/2019   MPG 116.89 01/18/2019   MPG 114 12/24/2016   Lab Results  Component Value Date   PROLACTIN 28.8 (H) 12/24/2016   PROLACTIN 32.3 (H) 08/19/2016   Lab Results  Component Value Date   CHOL 147 08/31/2018   TRIG 189 (H) 08/31/2018   HDL 45 08/31/2018   CHOLHDL 3.3 08/31/2018   VLDL 26 12/24/2016   LDLCALC 74 08/31/2018   LDLCALC 72 04/06/2018    Current Medications: Current Facility-Administered Medications  Medication Dose Route Frequency Provider Last Rate Last Dose  . bictegravir-emtricitabine-tenofovir AF (BIKTARVY) 50-200-25 MG per tablet 1 tablet  1 tablet Oral Daily Kerry Hough, PA-C   Stopped at 01/18/19 [id number]  . busPIRone (BUSPAR) tablet 5 mg  5 mg Oral BID Rankin, Shuvon B, NP   5 mg at 01/18/19 0836  . citalopram (CELEXA) tablet 10 mg  10 mg Oral Daily Rankin, Shuvon B, NP   10 mg at 01/18/19 0836  . FLUoxetine (PROZAC) capsule 20 mg  20 mg Oral Daily Rankin, Shuvon B, NP   20 mg at 01/18/19 0836  . gabapentin (NEURONTIN) capsule 100 mg  100 mg Oral TID Rankin, Shuvon B, NP   100 mg at 01/18/19 0836  . hydrOXYzine (ATARAX/VISTARIL) tablet 25 mg  25 mg Oral Q6H PRN Donell Sievert E, PA-C      . ibuprofen (ADVIL) tablet 600 mg  600 mg Oral Q6H PRN Donell Sievert E, PA-C      . loperamide (IMODIUM) capsule 2-4 mg  2-4 mg Oral PRN Kerry Hough, PA-C      . LORazepam (ATIVAN) tablet 1 mg  1 mg Oral Q6H PRN Donell Sievert E, PA-C      . ondansetron (ZOFRAN-ODT)  disintegrating tablet 4 mg  4 mg Oral Q6H PRN Donell Sievert E, PA-C      . risperiDONE (RISPERDAL) tablet 1 mg  1 mg Oral QHS Rankin, Shuvon B, NP   1 mg at 01/18/19 0007  . thiamine (B-1) injection 100 mg  100 mg Intramuscular Once Donell Sievert E, PA-C      . thiamine (VITAMIN B-1) tablet 100 mg  100 mg Oral Daily Donell Sievert E, PA-C   100 mg at 01/18/19 0836  . traZODone (DESYREL) tablet 100 mg  100 mg Oral QHS PRN Kerry Hough, PA-C       PTA Medications: Medications Prior to  Admission  Medication Sig Dispense Refill Last Dose  . BIKTARVY 50-200-25 MG TABS tablet TAKE 1 TABLET BY MOUTH DAILY (Patient not taking: No sig reported) 30 tablet 0 Not Taking at Unknown time    Musculoskeletal: Strength & Muscle Tone: within normal limits Gait & Station: UTA since patient is lying in bed. Patient leans: N/A  Psychiatric Specialty Exam: Physical Exam  Nursing note and vitals reviewed. Constitutional: He is oriented to person, place, and time. He appears well-developed and well-nourished.  HENT:  Head: Normocephalic and atraumatic.  Neck: Normal range of motion.  Respiratory: Effort normal.  Musculoskeletal: Normal range of motion.  Neurological: He is alert and oriented to person, place, and time.  Psychiatric: He has a normal mood and affect. His speech is normal and behavior is normal. Judgment and thought content normal. Cognition and memory are normal.    Review of Systems  Psychiatric/Behavioral: Positive for depression and substance abuse.  All other systems reviewed and are negative.   Blood pressure 118/79, pulse 77, temperature 97.9 F (36.6 C), resp. rate 16, height 5\' 7"  (1.702 m), weight 64.9 kg, SpO2 98 %.Body mass index is 22.4 kg/m.  General Appearance: Fairly Groomed, young, African American male who is lying in bed under the covers. NAD.   Eye Contact:  Good  Speech:  Clear and Coherent and Normal Rate  Volume:  Normal  Mood:  Dysphoric  Affect:   Constricted  Thought Process:  Linear and Descriptions of Associations: Intact  Orientation:  Full (Time, Place, and Person)  Thought Content:  Logical  Suicidal Thoughts:  No  Homicidal Thoughts:  No  Memory:  Immediate;   Good Recent;   Good Remote;   Good  Judgement:  Fair  Insight:  Fair  Psychomotor Activity:  Normal  Concentration:  Concentration: Good and Attention Span: Good  Recall:  Good  Fund of Knowledge:  Good  Language:  Good  Akathisia:  No  Handed:  Right  AIMS (if indicated):   N/A  Assets:  Communication Skills Desire for Improvement Resilience  ADL's:  Intact  Cognition:  WNL  Sleep:   N/A   Assessment:  Tavarion Stoneback is a 33 y.o. male who was admitted with paranoia and conditional SI with a plan to harm self if discharged. He is well known to psychiatry and has a history significant for malingering for shelter. Today he reports coming to the hospital for long term rehab. He is able to safety plan. Peer support will be consulted for substance abuse treatment resources. He does not warrant inpatient psychiatric hospitalization at this time. There is no evidence of a psychiatric condition which is amendable to inpatient psychiatric hospitalization. Patient's SI appears to be conditional and is a means of manipulation and attention seeking without a true intention to harm self. His behavior is more consistent with someone who is acting in self-preservation, rather than someone who is acutely suicidal.    Treatment Plan Summary: -Discharge home.  -Continue psychotropic medication regimen.  -Follow up with outpatient mental health provider.    Observation Level/Precautions:  15 minute checks Laboratory:  CBC Chemistry Profile UDS UA Psychotherapy:  N/A Medications:  Continue home medications.  Consultations:  N/A Discharge Concerns: None at this time.    Estimated LOS: Patient had overnight observation for safety.  Other:  N/A    Cherly Beach, DO 4/22/202011:08 AM

## 2019-01-18 NOTE — Discharge Instructions (Signed)
To help you maintain a sober lifestyle, a substance abuse treatment program may be beneficial to you.  Contact one of the following facilities at your earliest opportunity to ask about enrolling:  RESIDENTIAL PROGRAMS:       [facility]      [address] [place], [place] [address]      [phone]       [facility]      [address] [place], [place] [address]      [phone]       [facility]      [address]      [place], [place] [address]      [phone]  OUTPATIENT PROGRAMS:       [facility]      [address] [place], [place] [address]      [phone]; select Option 5

## 2019-01-18 NOTE — Progress Notes (Signed)
Per MD ok to give HIV medication.

## 2019-01-18 NOTE — Patient Instructions (Signed)
[facility] met with Pt and was able to process with Pt to gain information to better assist Pt with services. John and myself was able to use motivational interviewing to help direct Pt in right direction. [facility]PSS informed Pt that he maybe able to get into [facility] services. CPSS gave Pt instructions as to what is needed of him an what he will have to have with him. CPSS left contact information for Pt to stay in contact with [facility] in the community.

## 2019-01-18 NOTE — BH Assessment (Signed)
[facility] Assessment Progress Note  Per Juanetta Beets, DO, this pt does not require psychiatric hospitalization at this time.  Pt is to be discharged from [facility] Observation Unit with substance abuse treatment referrals.  These have been included in pt's discharge instructions.  Pt would also benefit from seeing Peer Support Specialists; they will be asked to speak to pt.  Pt's nurse, Jan, has been notified.  Doylene Canning, MA Triage Specialist [phone]

## 2019-01-18 NOTE — Progress Notes (Signed)
Pt d/c from the hospital per MD order. All items returned. D/C instruction given, prescriptions given and suicide information given.

## 2019-01-18 NOTE — Progress Notes (Signed)
Pt reports passive si thoughts to use a cord, take pills or walk in front of a car. Pt says that he has voices that tell him "not worthy" and "kill myself." Pt has been asleep most of the morning and woke up to take his medication. Pt is being monitored for safety. Safety maintained in the obs unit.

## 2019-01-18 NOTE — Patient Outreach (Signed)
[facility] met with Pt and was able to provide information to [facility] services in [place] left contact information to follow up with [facility]PSS in the community.

## 2019-01-30 ENCOUNTER — Other Ambulatory Visit: Payer: Self-pay | Admitting: Internal Medicine

## 2019-01-30 DIAGNOSIS — B20 Human immunodeficiency virus [HIV] disease: Secondary | ICD-10-CM

## 2019-02-28 ENCOUNTER — Other Ambulatory Visit: Payer: Self-pay | Admitting: Internal Medicine

## 2019-02-28 DIAGNOSIS — B20 Human immunodeficiency virus [HIV] disease: Secondary | ICD-10-CM

## 2019-02-28 DIAGNOSIS — Z21 Asymptomatic human immunodeficiency virus [HIV] infection status: Secondary | ICD-10-CM

## 2019-03-02 ENCOUNTER — Other Ambulatory Visit: Payer: Self-pay | Admitting: *Deleted

## 2019-03-02 DIAGNOSIS — B20 Human immunodeficiency virus [HIV] disease: Secondary | ICD-10-CM

## 2019-03-02 MED ORDER — BICTEGRAVIR-EMTRICITAB-TENOFOV 50-200-25 MG PO TABS: 1.0000 | ORAL_TABLET | Freq: Every day | ORAL | 0 refills | Status: DC

## 2019-03-08 ENCOUNTER — Telehealth: Payer: Self-pay

## 2019-03-08 NOTE — Telephone Encounter (Signed)
Patient called office today requesting refills on Biktarvy and Buspar. Patient has not been in the office since 10/2017. Patient refused appointment's in June and would like to wait for July. Unable to refill medication at this time. Explained to patient he must have lab work and office visit in order to get refills. Patient states he will call office in July to set appointment.  [place]

## 2019-03-22 ENCOUNTER — Ambulatory Visit (INDEPENDENT_AMBULATORY_CARE_PROVIDER_SITE_OTHER): Payer: Self-pay | Admitting: Pharmacist

## 2019-03-22 DIAGNOSIS — Z79899 Other long term (current) drug therapy: Secondary | ICD-10-CM

## 2019-03-22 DIAGNOSIS — B20 Human immunodeficiency virus [HIV] disease: Secondary | ICD-10-CM

## 2019-03-22 DIAGNOSIS — B182 Chronic viral hepatitis C: Secondary | ICD-10-CM

## 2019-03-22 MED ORDER — BIKTARVY 50-200-25 MG PO TABS: 1.0000 | ORAL_TABLET | Freq: Every day | ORAL | 5 refills | Status: DC

## 2019-03-22 NOTE — Progress Notes (Signed)
HPI: Edwin Martinez is a 33 y.o. male who presents to the [facility] pharmacy clinic for HIV follow-up after being out of care for several years.  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   Influenza A 10/11/2018   MDD (major depressive disorder), severe (HCC) 10/06/2018   Diarrhea due to protozoan    Giardial enteritis    Norovirus    MDD (major depressive disorder) 02/26/2018   Suicide attempt (HCC)    Depression 11/08/2017   Chest tightness    Cough    Leukocytosis    SOB (shortness of breath)    Nausea vomiting and diarrhea    Asthma exacerbation 08/15/2017   Elevated LFTs 08/12/2017   Cocaine abuse with cocaine-induced mood disorder (HCC) 03/30/2017   Herpes zoster 03/06/2017   Polysubstance abuse (HCC) 03/06/2017   Homelessness 03/06/2017   Schizoaffective disorder (HCC) 12/23/2016   Suicidal ideation    Cannabis use disorder, moderate, dependence (HCC) 07/27/2016   Tobacco user 07/27/2016   Intentional drug overdose (HCC) 07/18/2016   Asthma 05/20/2007   HIV (human immunodeficiency virus infection) (HCC) 05/05/2007    Patient's Medications  New Prescriptions   No medications on file  Previous Medications   BUSPIRONE (BUSPAR) 5 MG TABLET    Take 1 tablet (5 mg total) by mouth 2 (two) times daily. For anxiety   CITALOPRAM (CELEXA) 10 MG TABLET    Take 1 tablet (10 mg total) by mouth daily. For mood   FLUOXETINE (PROZAC) 20 MG CAPSULE    Take 1 capsule (20 mg total) by mouth daily.   GABAPENTIN (NEURONTIN) 100 MG CAPSULE    Take 1 capsule (100 mg total) by mouth 3 (three) times daily. For anxiety/agitation   RISPERIDONE (RISPERDAL) 1 MG TABLET    Take 1 tablet (1 mg total) by mouth at bedtime. For hallucinations   TRAZODONE (DESYREL) 50 MG TABLET    Take 1 tablet (50 mg total) by mouth at bedtime as needed for sleep.  Modified Medications   Modified Medication Previous Medication     BICTEGRAVIR-EMTRICITABINE-TENOFOVIR AF (BIKTARVY) 50-200-25 MG TABS TABLET bictegravir-emtricitabine-tenofovir AF (BIKTARVY) 50-200-25 MG TABS tablet      Take 1 tablet by mouth daily.    Take 1 tablet by mouth daily.  Discontinued Medications   No medications on file    Allergies: Allergies  Allergen Reactions   Magnesium-Containing Compounds Other (See Comments)    This medication is contraindicated with pts HIV meds.     Peanut-Containing Drug Products Anaphylaxis   Esomeprazole Magnesium Cough   Atripla [Efavirenz-Emtricitab-Tenofovir] Other (See Comments)    Reaction:  Suicidal thoughts    Atripla [Efavirenz-Emtricitab-Tenofovir]    Bactrim [Sulfamethoxazole-Trimethoprim]    Magnesium-Containing Compounds    Nexium [Esomeprazole Magnesium]    Peanut-Containing Drug Products    Penicillins    Bactrim [Sulfamethoxazole-Trimethoprim] Rash   Penicillins Rash and Other (See Comments)    Has patient had a PCN reaction causing immediate rash, facial/tongue/throat swelling, SOB or lightheadedness with hypotension: Yes Has patient had a PCN reaction causing severe rash involving mucus membranes or skin necrosis: No Has patient had a PCN reaction that required hospitalization No Has patient had a PCN reaction occurring within the last 10 years: No If all of the above answers are "NO", then may proceed with Cephalosporin use.    Past Medical History: Past Medical History:  Diagnosis Date   ADD (attention deficit disorder)    Anxiety  Asthma    Bipolar 1 disorder ([place])    Depression    HIV (human immunodeficiency virus infection) ([place]) dx'd 2008   Hypertension    Insomnia    Intentional drug overdose ([place])    Archie Endo 02/25/2018   Polysubstance abuse ([place])    Archie Endo 02/25/2018   Schizophrenia ([place])    Seizures ([place])    "used to have little black-out szs where I'd drop out for 2-3 min then come back; nothing in the last 2-3-4years" (02/25/2018)    Shingles     Social History: Social History   Socioeconomic History   Marital status: Single    Spouse name: Not on file   Number of children: Not on file   Years of education: Not on file   Highest education level: Not on file  Occupational History   Occupation: disability pending  Social Needs   Financial resource strain: Not on file   Food insecurity    Worry: Not on file    Inability: Not on file   Transportation needs    Medical: Not on file    Non-medical: Not on file  Tobacco Use   Smoking status: Current Every Day Smoker    Packs/day: 0.50    Years: 20.00    Pack years: 10.00    Types: Cigarettes    Start date: 09/29/1991   Smokeless tobacco: Never Used  Substance and Sexual Activity   Alcohol use: Yes    Alcohol/week: 4.0 standard drinks    Types: 4 Cans of beer per week    Comment: occasionally, "socially"   Drug use: Yes    Types: Marijuana, Cocaine    Comment: 10/06/2018 "self medicating occassionally"   Sexual activity: Yes    Birth control/protection: Condom  Lifestyle   Physical activity    Days per week: Not on file    Minutes per session: Not on file   Stress: Not on file  Relationships   Social connections    Talks on phone: Not on file    Gets together: Not on file    Attends religious service: Not on file    Active member of club or organization: Not on file    Attends meetings of clubs or organizations: Not on file    Relationship status: Not on file  Other Topics Concern   Not on file  Social History Narrative   ** Merged History Encounter **       ** Merged History Encounter **        Labs: Lab Results  Component Value Date   HIV1RNAQUANT 17,100 (H) 08/31/2018   HIV1RNAQUANT 45 (H) 04/06/2018   HIV1RNAQUANT 54,200 (H) 11/08/2017   CD4TABS 1,250 08/31/2018   CD4TABS 1,560 04/06/2018   CD4TABS CLERICAL ERROR 11/08/2017   CD4TABS 840 11/08/2017    RPR and STI Lab Results  Component Value Date   LABRPR  NON-REACTIVE 08/31/2018   LABRPR NON-REACTIVE 04/06/2018   LABRPR Non Reactive 03/07/2017   LABRPR NON REAC 12/17/2016   LABRPR NON REAC 11/06/2014    STI Results GC CT  04/06/2018 Negative Negative  12/19/2017 **POSITIVE**(A) **POSITIVE**(A)  03/07/2017 Negative Negative  12/17/2016 Negative Negative  02/05/2015 Negative Negative    Hepatitis B Lab Results  Component Value Date   HEPBSAB REACTIVE (A) 09/06/2012   HEPBSAG Negative 03/02/2018   HEPBCAB NEG 09/06/2012   Hepatitis C No results found for: HEPCAB, HCVRNAPCRQN Hepatitis A Lab Results  Component Value Date   HAV NEG 09/06/2012   Lipids:  Lab Results  Component Value Date   CHOL 147 08/31/2018   TRIG 189 (H) 08/31/2018   HDL 45 08/31/2018   CHOLHDL 3.3 08/31/2018   VLDL 26 12/24/2016   LDLCALC 74 08/31/2018    Current HIV Regimen: Biktarvy  Assessment: Edwin Martinez is here today to follow-up for his HIV infection.  He was brought in by [facility] after a long absence from our clinic.  He was last seen by Judeth CornfieldStephanie in June 2018 for HIV and hospital follow-up for shingles.  Before that, he was seen by [name] in December 2016.    He struggles with cocaine abuse, alcoholism, frequent suicide ideation, schizoaffective disorder and depression.  He has been off of his psych medications for awhile but is working with Marthann SchillerMitch to get back in with [facility].  He is hoping to get that done soon. He is very twitchy and itchy today when he speaks with me.  He states that he hasn't done cocaine in "5-6 months" but his tox screen was positive for cocaine in April of this year. He states that instead of cocaine, he has turned to drinking 2-3 32oz beers per day and smoking more cigarettes. 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  He isn't having any issues that he speaks of today. No nightsweats, headaches, SOB, CP, n/v/d, fevers, or sore throat.   He was off of his ART for "awhile" but started taking Biktarvy again about a month ago before he  ran out of refills. His last HIV viral load was 17,100 in December 2019. He states that when he does take it, he takes it every day at 8pm and does not skip days here and there.  If he stops, he stops cold [place]. He wishes to continue on Biktarvy.  Resistance labs were last checked in January 2019 and it does not look like he has developed any in the past.   He also has chronic untreated Hepatitis C. His LFTs have been elevated for several years due to this.  I explained how dangerous it was to be drinking like he does and have Hepatitis C.  I also explained that we can cure Hep C and get him started on treatment but he had to be stable on ART and undetectable before we would do that.  His HMAP is active and expires in September.   I will recheck labs today and refill his Biktarvy.  I will check for resistance just to be sure.  Will also check Hepatitis C labs in hopes that he gets back on track and we can start treatment for that in the near future. He already has a follow-up appointment scheduled with [name] for next month.  He knows to call or let Marthann SchillerMitch know if he needs anything in the interim.   Plan: - Refill Biktarvy PO once daily - HIV RNA with reflex, CD4, RPR CMET, urine cytology for gonorrhea/chlamydia, lipid panel, Hep C RNA, Hep C genotype, liver fibrosis today - F/u with [name] 7/20 at 330pm  Jewel Venditto L. Armenta Erskin, PharmD, BCIDP, AAHIVP, CPP Infectious Diseases Clinical Pharmacist [facility] 03/22/2019, 3:39 PM

## 2019-03-23 LAB — URINE CYTOLOGY ANCILLARY ONLY
Chlamydia: NEGATIVE
Neisseria Gonorrhea: NEGATIVE

## 2019-03-23 LAB — T-HELPER CELL (CD4) - (RCID CLINIC ONLY)
CD4 % Helper T Cell: 39 % (ref 33–65)
CD4 T Cell Abs: 1154 /uL (ref 400–1790)

## 2019-03-29 ENCOUNTER — Telehealth: Payer: Self-pay | Admitting: *Deleted

## 2019-03-29 NOTE — Telephone Encounter (Signed)
Patient stood up Edwin Martinez for his [facility] intake/assessment/psychiatric appointment today despite confirming the appointment with [name].  Patient is aware that he needs to go to [facility] for mental health medications. Landis Gandy, RN

## 2019-03-30 ENCOUNTER — Ambulatory Visit: Payer: Self-pay

## 2019-04-02 ENCOUNTER — Emergency Department (HOSPITAL_COMMUNITY)
Admission: EM | Admit: 2019-04-02 | Discharge: 2019-04-04 | Disposition: A | Discharge status: Home / Self Care | Payer: Self-pay | Attending: Emergency Medicine | Admitting: Emergency Medicine

## 2019-04-02 ENCOUNTER — Encounter (HOSPITAL_COMMUNITY): Payer: Self-pay | Admitting: Emergency Medicine

## 2019-04-02 DIAGNOSIS — R45851 Suicidal ideations: Secondary | ICD-10-CM | POA: Insufficient documentation

## 2019-04-02 DIAGNOSIS — Z590 Homelessness: Secondary | ICD-10-CM | POA: Insufficient documentation

## 2019-04-02 DIAGNOSIS — F17210 Nicotine dependence, cigarettes, uncomplicated: Secondary | ICD-10-CM | POA: Insufficient documentation

## 2019-04-02 DIAGNOSIS — F1410 Cocaine abuse, uncomplicated: Secondary | ICD-10-CM | POA: Insufficient documentation

## 2019-04-02 DIAGNOSIS — F1510 Other stimulant abuse, uncomplicated: Secondary | ICD-10-CM | POA: Insufficient documentation

## 2019-04-02 DIAGNOSIS — Z880 Allergy status to penicillin: Secondary | ICD-10-CM | POA: Insufficient documentation

## 2019-04-02 DIAGNOSIS — Z915 Personal history of self-harm: Secondary | ICD-10-CM | POA: Insufficient documentation

## 2019-04-02 DIAGNOSIS — J45909 Unspecified asthma, uncomplicated: Secondary | ICD-10-CM | POA: Insufficient documentation

## 2019-04-02 DIAGNOSIS — I10 Essential (primary) hypertension: Secondary | ICD-10-CM | POA: Insufficient documentation

## 2019-04-02 DIAGNOSIS — Z21 Asymptomatic human immunodeficiency virus [HIV] infection status: Secondary | ICD-10-CM | POA: Insufficient documentation

## 2019-04-02 DIAGNOSIS — F250 Schizoaffective disorder, bipolar type: Secondary | ICD-10-CM | POA: Insufficient documentation

## 2019-04-02 DIAGNOSIS — Z79899 Other long term (current) drug therapy: Secondary | ICD-10-CM | POA: Insufficient documentation

## 2019-04-02 DIAGNOSIS — Z03818 Encounter for observation for suspected exposure to other biological agents ruled out: Secondary | ICD-10-CM | POA: Insufficient documentation

## 2019-04-02 LAB — COMPREHENSIVE METABOLIC PANEL
AG Ratio: 1.6 (calc) (ref 1.0–2.5)
ALT: 83 U/L — ABNORMAL HIGH (ref 9–46)
ALT: 90 U/L — ABNORMAL HIGH (ref 0–44)
AST: 50 U/L — ABNORMAL HIGH (ref 10–40)
AST: 67 U/L — ABNORMAL HIGH (ref 15–41)
Albumin: 4.2 g/dL (ref 3.6–5.1)
Albumin: 3.8 g/dL (ref 3.5–5.0)
Alkaline Phosphatase: 74 U/L (ref 38–126)
Alkaline phosphatase (APISO): 72 U/L (ref 36–130)
Anion gap: 12 (ref 5–15)
BUN: 7 mg/dL (ref 7–25)
BUN: 9 mg/dL (ref 6–20)
CO2: 27 mmol/L (ref 20–32)
CO2: 17 mmol/L — ABNORMAL LOW (ref 22–32)
Calcium: 9.3 mg/dL (ref 8.6–10.3)
Calcium: 9 mg/dL (ref 8.9–10.3)
Chloride: 105 mmol/L (ref 98–110)
Chloride: 112 mmol/L — ABNORMAL HIGH (ref 98–111)
Creat: 0.8 mg/dL (ref 0.60–1.35)
Creatinine, Ser: 0.92 mg/dL (ref 0.61–1.24)
GFR calc Af Amer: >60 mL/min (ref >60)
GFR calc non Af Amer: >60 mL/min (ref >60)
Globulin: 2.6 g/dL (calc) (ref 1.9–3.7)
Glucose, Bld: 89 mg/dL (ref 65–99)
Glucose, Bld: 91 mg/dL (ref 70–99)
Potassium: 3.4 mmol/L — ABNORMAL LOW (ref 3.5–5.3)
Potassium: 3.8 mmol/L (ref 3.5–5.1)
Sodium: 139 mmol/L (ref 135–146)
Sodium: 141 mmol/L (ref 135–145)
Total Bilirubin: 0.6 mg/dL (ref 0.2–1.2)
Total Bilirubin: 0.5 mg/dL (ref 0.3–1.2)
Total Protein: 6.8 g/dL (ref 6.1–8.1)
Total Protein: 7.1 g/dL (ref 6.5–8.1)

## 2019-04-02 LAB — RAPID URINE DRUG SCREEN, HOSP PERFORMED
Amphetamines: POSITIVE — AB
Barbiturates: NOT DETECTED
Benzodiazepines: NOT DETECTED
Cocaine: POSITIVE — AB
Opiates: NOT DETECTED
Tetrahydrocannabinol: POSITIVE — AB

## 2019-04-02 LAB — LIVER FIBROSIS, FIBROTEST-ACTITEST
ALT: 83 U/L — ABNORMAL HIGH (ref 9–46)
Alpha-2-Macroglobulin: 113 mg/dL (ref 106–279)
Apolipoprotein A1: 165 mg/dL (ref 94–176)
Bilirubin: 0.5 mg/dL (ref 0.2–1.2)
Fibrosis Score: 0.07
GGT: 72 U/L (ref 3–90)
Haptoglobin: 106 mg/dL (ref 43–212)
Necroinflammat ACT Score: 0.4
Reference ID: 2986822

## 2019-04-02 LAB — CBC
HCT: 48.2 % (ref 39.0–52.0)
Hemoglobin: 15.9 g/dL (ref 13.0–17.0)
MCH: 29 pg (ref 26.0–34.0)
MCHC: 33 g/dL (ref 30.0–36.0)
MCV: 88 fL (ref 80.0–100.0)
Platelets: 262 10*3/uL (ref 150–400)
RBC: 5.48 MIL/uL (ref 4.22–5.81)
RDW: 13.2 % (ref 11.5–15.5)
WBC: 9.4 10*3/uL (ref 4.0–10.5)
nRBC: 0 % (ref 0.0–0.2)

## 2019-04-02 LAB — LIPID PANEL
Cholesterol: 142 mg/dL (ref <200)
HDL: 65 mg/dL (ref >=40)
LDL Cholesterol (Calc): 64 mg/dL (calc)
Non-HDL Cholesterol (Calc): 77 mg/dL (calc) (ref <130)
Total CHOL/HDL Ratio: 2.2 (calc) (ref <5.0)
Triglycerides: 52 mg/dL (ref <150)

## 2019-04-02 LAB — ETHANOL: Alcohol, Ethyl (B): 31 mg/dL — ABNORMAL HIGH (ref <10)

## 2019-04-02 LAB — HIV RNA, RTPCR W/R GT (RTI, PI,INT)
HIV 1 RNA Quant: <20 copies/mL
HIV-1 RNA Quant, Log: <1.3 Log copies/mL

## 2019-04-02 LAB — HEPATITIS C RNA QUANTITATIVE
HCV Quantitative Log: 5.89 Log IU/mL — ABNORMAL HIGH
HCV RNA, PCR, QN: 771000 IU/mL — ABNORMAL HIGH

## 2019-04-02 LAB — HEPATITIS C GENOTYPE

## 2019-04-02 LAB — RPR: RPR Ser Ql: NONREACTIVE

## 2019-04-02 LAB — ACETAMINOPHEN LEVEL: Acetaminophen (Tylenol), Serum: <10 ug/mL — ABNORMAL LOW (ref 10–30)

## 2019-04-02 LAB — SALICYLATE LEVEL: Salicylate Lvl: <7 mg/dL (ref 2.8–30.0)
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
## 2019-04-03 LAB — SARS CORONAVIRUS 2 BY RT PCR (HOSPITAL ORDER, PERFORMED IN ~~LOC~~ HOSPITAL LAB): SARS Coronavirus 2: NEGATIVE

## 2019-04-03 MED ORDER — BICTEGRAVIR-EMTRICITAB-TENOFOV 50-200-25 MG PO TABS
1.0000 | ORAL_TABLET | Freq: Every day | ORAL | Status: DC
  Administered 2019-04-03 – 2019-04-04 (×2): 1 via ORAL
  Filled 2019-04-03 (×2): qty 1

## 2019-04-03 MED ORDER — RISPERIDONE 1 MG PO TABS
1.0000 mg | ORAL_TABLET | Freq: Every day | ORAL | Status: DC
  Administered 2019-04-03 (×2): 1 mg via ORAL
  Filled 2019-04-03 (×3): qty 1

## 2019-04-03 MED ORDER — CITALOPRAM HYDROBROMIDE 10 MG PO TABS
10.0000 mg | ORAL_TABLET | Freq: Every day | ORAL | Status: DC
  Administered 2019-04-03 – 2019-04-04 (×2): 10 mg via ORAL
  Filled 2019-04-03 (×3): qty 1

## 2019-04-03 MED ORDER — BUSPIRONE HCL 10 MG PO TABS
5.0000 mg | ORAL_TABLET | Freq: Two times a day (BID) | ORAL | Status: DC
  Administered 2019-04-03 – 2019-04-04 (×4): 5 mg via ORAL
  Filled 2019-04-03 (×4): qty 1

## 2019-04-03 NOTE — ED Notes (Signed)
Meal given to pt.

## 2019-04-03 NOTE — ED Notes (Signed)
Lunch ordered 

## 2019-04-03 NOTE — ED Notes (Signed)
Pt currently with tts.

## 2019-04-03 NOTE — ED Notes (Signed)
Pt admitted he was trying to walk into traffic.

## 2019-04-03 NOTE — ED Notes (Signed)
Patient's belongings are in locker #6.
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
## 2019-04-03 NOTE — Progress Notes (Signed)
Pt. meets criteria for inpatient treatment per Anette Riedel, NP.  No appropriate beds available at [facility]. Referred out to the following hospitals: [facility] Details [facility] [facility] Details [facility] Details [place] Details [facility] Details [facility] Details [facility] Details [facility] Details [place] Details [facility] Details [place]  Disposition CSW will continue to follow for placement.  Areatha Keas. Judi Cong, MSW, [place] Disposition Clinical Social Work [phone] (cell) [phone] (office)
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
## 2019-04-03 NOTE — ED Notes (Signed)
Ordered bfast 

## 2019-04-03 NOTE — ED Notes (Signed)
Patient transferred from main ED hallway bed with sitter. Sitter stated did not have paperwork and unable to find any paperwork and sitter is not aware of any belongings bag.  Woke patient and asked if he had belongings upon arrival. Currently wearing purple scrubs.  Stated wearing clothes and brought a black bag. Will continue to look for patient's belongings.

## 2019-04-04 ENCOUNTER — Encounter (HOSPITAL_COMMUNITY): Payer: Self-pay

## 2019-04-04 ENCOUNTER — Inpatient Hospital Stay (HOSPITAL_COMMUNITY)
Admission: AD | Admit: 2019-04-04 | Discharge: 2019-04-10 | DRG: 885 | Disposition: A | Discharge status: Home / Self Care | Payer: Federal, State, Local not specified - Other | Source: Intra-hospital | Attending: Psychiatry | Admitting: Psychiatry

## 2019-04-04 ENCOUNTER — Other Ambulatory Visit: Payer: Self-pay

## 2019-04-04 DIAGNOSIS — F988 Other specified behavioral and emotional disorders with onset usually occurring in childhood and adolescence: Secondary | ICD-10-CM | POA: Diagnosis present

## 2019-04-04 DIAGNOSIS — F17200 Nicotine dependence, unspecified, uncomplicated: Secondary | ICD-10-CM | POA: Diagnosis present

## 2019-04-04 DIAGNOSIS — Z882 Allergy status to sulfonamides status: Secondary | ICD-10-CM | POA: Diagnosis not present

## 2019-04-04 DIAGNOSIS — Y901 Blood alcohol level of 20-39 mg/100 ml: Secondary | ICD-10-CM | POA: Diagnosis present

## 2019-04-04 DIAGNOSIS — G4700 Insomnia, unspecified: Secondary | ICD-10-CM | POA: Diagnosis present

## 2019-04-04 DIAGNOSIS — Z888 Allergy status to other drugs, medicaments and biological substances status: Secondary | ICD-10-CM | POA: Diagnosis not present

## 2019-04-04 DIAGNOSIS — Z590 Homelessness: Secondary | ICD-10-CM

## 2019-04-04 DIAGNOSIS — F259 Schizoaffective disorder, unspecified: Secondary | ICD-10-CM | POA: Diagnosis present

## 2019-04-04 DIAGNOSIS — F1414 Cocaine abuse with cocaine-induced mood disorder: Secondary | ICD-10-CM | POA: Diagnosis present

## 2019-04-04 DIAGNOSIS — Z818 Family history of other mental and behavioral disorders: Secondary | ICD-10-CM | POA: Diagnosis not present

## 2019-04-04 DIAGNOSIS — R45851 Suicidal ideations: Secondary | ICD-10-CM | POA: Diagnosis present

## 2019-04-04 DIAGNOSIS — F250 Schizoaffective disorder, bipolar type: Secondary | ICD-10-CM | POA: Diagnosis present

## 2019-04-04 DIAGNOSIS — B20 Human immunodeficiency virus [HIV] disease: Secondary | ICD-10-CM | POA: Diagnosis present

## 2019-04-04 DIAGNOSIS — Z880 Allergy status to penicillin: Secondary | ICD-10-CM

## 2019-04-04 DIAGNOSIS — F419 Anxiety disorder, unspecified: Secondary | ICD-10-CM | POA: Diagnosis present

## 2019-04-04 DIAGNOSIS — I10 Essential (primary) hypertension: Secondary | ICD-10-CM | POA: Diagnosis present

## 2019-04-04 DIAGNOSIS — F251 Schizoaffective disorder, depressive type: Secondary | ICD-10-CM | POA: Diagnosis not present

## 2019-04-04 DIAGNOSIS — B1920 Unspecified viral hepatitis C without hepatic coma: Secondary | ICD-10-CM | POA: Diagnosis present

## 2019-04-04 DIAGNOSIS — F1424 Cocaine dependence with cocaine-induced mood disorder: Secondary | ICD-10-CM | POA: Diagnosis not present

## 2019-04-04 DIAGNOSIS — F1210 Cannabis abuse, uncomplicated: Secondary | ICD-10-CM | POA: Diagnosis present

## 2019-04-04 DIAGNOSIS — R569 Unspecified convulsions: Secondary | ICD-10-CM | POA: Diagnosis present

## 2019-04-04 DIAGNOSIS — Z79899 Other long term (current) drug therapy: Secondary | ICD-10-CM | POA: Diagnosis not present

## 2019-04-04 DIAGNOSIS — Z7289 Other problems related to lifestyle: Secondary | ICD-10-CM | POA: Diagnosis not present

## 2019-04-04 DIAGNOSIS — J45909 Unspecified asthma, uncomplicated: Secondary | ICD-10-CM | POA: Diagnosis present

## 2019-04-04 DIAGNOSIS — Z8249 Family history of ischemic heart disease and other diseases of the circulatory system: Secondary | ICD-10-CM

## 2019-04-04 DIAGNOSIS — Z915 Personal history of self-harm: Secondary | ICD-10-CM | POA: Diagnosis not present

## 2019-04-04 DIAGNOSIS — F332 Major depressive disorder, recurrent severe without psychotic features: Secondary | ICD-10-CM | POA: Diagnosis present

## 2019-04-04 MED ORDER — LORAZEPAM 1 MG PO TABS: 1.0000 mg | ORAL_TABLET | Freq: Two times a day (BID) | ORAL | Status: DC

## 2019-04-04 MED ORDER — LOPERAMIDE HCL 2 MG PO CAPS: 2.0000 mg | ORAL_CAPSULE | ORAL | Status: DC | PRN

## 2019-04-04 MED ORDER — LORAZEPAM 1 MG PO TABS: 1.0000 mg | ORAL_TABLET | Freq: Three times a day (TID) | ORAL | Status: DC

## 2019-04-04 MED ORDER — ONDANSETRON 4 MG PO TBDP: 4.0000 mg | ORAL_TABLET | Freq: Four times a day (QID) | ORAL | Status: DC | PRN

## 2019-04-04 MED ORDER — NICOTINE 21 MG/24HR TD PT24
21.0000 mg | MEDICATED_PATCH | Freq: Every day | TRANSDERMAL | Status: DC
  Administered 2019-04-04 – 2019-04-10 (×6): 21 mg via TRANSDERMAL
  Filled 2019-04-04 (×12): qty 1

## 2019-04-04 MED ORDER — THIAMINE HCL 100 MG/ML IJ SOLN: 100.0000 mg | Freq: Once | INTRAMUSCULAR | Status: DC

## 2019-04-04 MED ORDER — ACETAMINOPHEN 325 MG PO TABS: 650.0000 mg | ORAL_TABLET | Freq: Four times a day (QID) | ORAL | Status: DC | PRN

## 2019-04-04 MED ORDER — LORAZEPAM 1 MG PO TABS: 1.0000 mg | ORAL_TABLET | Freq: Four times a day (QID) | ORAL | Status: AC | PRN

## 2019-04-04 MED ORDER — TRAZODONE HCL 50 MG PO TABS: 50.0000 mg | ORAL_TABLET | Freq: Every evening | ORAL | Status: DC | PRN

## 2019-04-04 MED ORDER — VITAMIN B-1 100 MG PO TABS
100.0000 mg | ORAL_TABLET | Freq: Every day | ORAL | Status: DC
  Administered 2019-04-05 – 2019-04-10 (×6): 100 mg via ORAL
  Filled 2019-04-04 (×9): qty 1

## 2019-04-04 MED ORDER — HYDROXYZINE HCL 25 MG PO TABS
25.0000 mg | ORAL_TABLET | Freq: Four times a day (QID) | ORAL | Status: AC | PRN
  Administered 2019-04-05 – 2019-04-06 (×2): 25 mg via ORAL
  Filled 2019-04-04 (×2): qty 1

## 2019-04-04 MED ORDER — LORAZEPAM 1 MG PO TABS
1.0000 mg | ORAL_TABLET | Freq: Four times a day (QID) | ORAL | Status: DC
  Administered 2019-04-04 – 2019-04-05 (×4): 1 mg via ORAL
  Filled 2019-04-04 (×4): qty 1

## 2019-04-04 MED ORDER — LORAZEPAM 1 MG PO TABS: 1.0000 mg | ORAL_TABLET | Freq: Every day | ORAL | Status: DC

## 2019-04-04 NOTE — ED Notes (Signed)
DC with [facility] to [facility]

## 2019-04-04 NOTE — ED Notes (Signed)
Breakfast tray ordered 

## 2019-04-04 NOTE — Discharge Planning (Signed)
Disposition Clinical Social Work is seeking post-discharge placement for this patient at the following level of care: Inpatient psych treatment.

## 2019-04-04 NOTE — Tx Team (Signed)
Initial Treatment Plan 04/04/2019 3:58 PM Edwin Martinez PQD:[id number]    PATIENT STRESSORS: Health problems Substance abuse   PATIENT STRENGTHS: Ability for insight Active sense of humor Average or above average intelligence   PATIENT IDENTIFIED PROBLEMS: "substance abuse"  Depression, anxiety                   DISCHARGE CRITERIA:  Ability to meet basic life and health needs Adequate post-discharge living arrangements Improved stabilization in mood, thinking, and/or behavior  PRELIMINARY DISCHARGE PLAN: Attend 12-step recovery group Outpatient therapy  PATIENT/FAMILY INVOLVEMENT: This treatment plan has been presented to and reviewed with the patient, Edwin Martinez, and/or family member,  The patient and family have been given the opportunity to ask questions and make suggestions.  Megan Mans, RN 04/04/2019, 3:58 PM

## 2019-04-04 NOTE — ED Triage Notes (Signed)
Report called to [facility] 
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
## 2019-04-04 NOTE — Progress Notes (Signed)
Patient did not attend wrap up group. 

## 2019-04-04 NOTE — Progress Notes (Signed)
Pt accepted to  [facility], Bed 301-1 Priscille Loveless, NP is the accepting provider.  Neita Garnet, MD is the attending provider.  Call report to [phone]  [facility] ED notified.   Pt is Voluntary.  Pt may be transported by [facility]  Pt scheduled  to arrive at [facility] @14 :00  Romie Minus T. Judi Cong, MSW, [place] Disposition Clinical Social Work [phone] (cell) [phone] (office)

## 2019-04-05 DIAGNOSIS — F1424 Cocaine dependence with cocaine-induced mood disorder: Secondary | ICD-10-CM

## 2019-04-05 DIAGNOSIS — F251 Schizoaffective disorder, depressive type: Secondary | ICD-10-CM

## 2019-04-05 MED ORDER — GABAPENTIN 100 MG PO CAPS
100.0000 mg | ORAL_CAPSULE | Freq: Three times a day (TID) | ORAL | Status: DC
  Administered 2019-04-05 – 2019-04-06 (×4): 100 mg via ORAL
  Filled 2019-04-05 (×9): qty 1

## 2019-04-05 MED ORDER — BICTEGRAVIR-EMTRICITAB-TENOFOV 50-200-25 MG PO TABS
1.0000 | ORAL_TABLET | Freq: Every day | ORAL | Status: DC
  Administered 2019-04-05 – 2019-04-10 (×6): 1 via ORAL
  Filled 2019-04-05 (×8): qty 1

## 2019-04-05 MED ORDER — CITALOPRAM HYDROBROMIDE 10 MG PO TABS
10.0000 mg | ORAL_TABLET | Freq: Every day | ORAL | Status: DC
  Administered 2019-04-05 – 2019-04-10 (×6): 10 mg via ORAL
  Filled 2019-04-05 (×8): qty 1
  Filled 2019-04-05: qty 7

## 2019-04-05 MED ORDER — RISPERIDONE 1 MG PO TABS
1.0000 mg | ORAL_TABLET | Freq: Every day | ORAL | Status: DC
  Administered 2019-04-05 – 2019-04-09 (×5): 1 mg via ORAL
  Filled 2019-04-05 (×7): qty 1
  Filled 2019-04-05: qty 7

## 2019-04-05 MED ORDER — TRAZODONE HCL 50 MG PO TABS
50.0000 mg | ORAL_TABLET | Freq: Every evening | ORAL | Status: DC | PRN
  Administered 2019-04-05 – 2019-04-08 (×4): 50 mg via ORAL
  Filled 2019-04-05: qty 5
  Filled 2019-04-05 (×3): qty 1

## 2019-04-05 NOTE — Progress Notes (Signed)
D: Pt denies SI/HI/AVH. Pt is pleasant and cooperative. Pt visible on the unit some this evening. Pt stated he was ok A: Pt was offered support and encouragement. Pt was given scheduled medications. Pt was encourage to attend groups. Q 15 minute checks were done for safety.  R:Pt attends groups and interacts well with peers and staff. Pt is taking medication. Pt has no complaints.Pt receptive to treatment and safety maintained on unit.

## 2019-04-05 NOTE — Plan of Care (Signed)
Nursing Progress Note [phone]   Patient presents with flat affect and depressed mood. Patient compliant with scheduled medications and denies need for PRNs this morning. Patient states he is not having withdrawal symptoms at this time. Patient currently denies SI/HI/AVH.   Patient is educated about and provided medication per provider's orders. Patient safety maintained with q15 min safety checks and high fall risk precautions. Emotional support given, 1:1 interaction, and active listening provided. Patient encouraged to attend meals, groups, and work on treatment plan and goals. Labs, vital signs and patient behavior monitored throughout shift. Patient encouraged to wear mask when in the milieu and is educated about coronavirus infection control precautions.  Patient refusing to wear mask on the unit but is compliant to wear mask off unit. Patient contracts for safety with staff. Patient remains safe on the unit at this time and agrees to come to staff with any issues/concerns. Patient is interacting with peers appropriately on the unit. Will continue to support and monitor.   Problem: Health Behavior/Discharge Planning: Goal: Compliance with treatment plan for underlying cause of condition will improve Outcome: Progressing   Problem: Physical Regulation: Goal: Ability to maintain clinical measurements within normal limits will improve Outcome: Progressing   Problem: Safety: Goal: Periods of time without injury will increase Outcome: Progressing    [place] NOVEL CORONAVIRUS (COVID-19) DAILY CHECK-OFF SYMPTOMS - answer yes or no to each - every day NO YES  Have you had a fever in the past 24 hours?  Fever (Temp > 37.80C / 100F) X   Have you had any of these symptoms in the past 24 hours? New Cough  Sore Throat   Shortness of Breath  Difficulty Breathing  Unexplained Body Aches   X   Have you had any one of these symptoms in the past 24 hours not related to allergies?   Runny  Nose  Nasal Congestion  Sneezing   X   If you have had runny nose, nasal congestion, sneezing in the past 24 hours, has it worsened?  X   EXPOSURES - check yes or no X   Have you traveled outside the state in the past 14 days?  X   Have you been in contact with someone with a confirmed diagnosis of COVID-19 or PUI in the past 14 days without wearing appropriate PPE?  X   Have you been living in the same home as a person with confirmed diagnosis of COVID-19 or a PUI (household contact)?    X   Have you been diagnosed with COVID-19?    X              What to do next: Answered NO to all: Answered YES to anything:   Proceed with unit schedule Follow the [facility] Inpatient Flowsheet.

## 2019-04-05 NOTE — Tx Team (Signed)
Interdisciplinary Treatment and Diagnostic Plan Update  04/05/2019 Time of Session: 9:00am Edwin Horsemandward Allen Martinez MRN: [id number]  Principal Diagnosis: Schizoaffective disorder [facility](HCC)  Secondary Diagnoses: Principal Problem:   Schizoaffective disorder (HCC) Active Problems:   MDD (major depressive disorder), recurrent severe, without psychosis (HCC)   Current Medications:  Current Facility-Administered Medications  Medication Dose Route Frequency Provider Last Rate Last Dose  . acetaminophen (TYLENOL) tablet 650 mg  650 mg Oral Q6H PRN Maryagnes AmosStarkes-Perry, Takia S, FNP      . bictegravir-emtricitabine-tenofovir AF (BIKTARVY) 50-200-25 MG per tablet 1 tablet  1 tablet Oral Daily Cobos, Fernando A, MD      . hydrOXYzine (ATARAX/VISTARIL) tablet 25 mg  25 mg Oral Q6H PRN Starkes-Perry, Juel Burrowakia S, FNP      . loperamide (IMODIUM) capsule 2-4 mg  2-4 mg Oral PRN Rosario AdieStarkes-Perry, Juel Burrowakia S, FNP      . LORazepam (ATIVAN) tablet 1 mg  1 mg Oral Q6H PRN Maryagnes AmosStarkes-Perry, Takia S, FNP      . LORazepam (ATIVAN) tablet 1 mg  1 mg Oral QID Maryagnes AmosStarkes-Perry, Takia S, FNP   1 mg at 04/05/19 0805   Followed by  . [START ON 04/06/2019] LORazepam (ATIVAN) tablet 1 mg  1 mg Oral TID Maryagnes AmosStarkes-Perry, Takia S, FNP       Followed by  . [START ON 04/07/2019] LORazepam (ATIVAN) tablet 1 mg  1 mg Oral BID Maryagnes AmosStarkes-Perry, Takia S, FNP       Followed by  . [START ON 04/08/2019] LORazepam (ATIVAN) tablet 1 mg  1 mg Oral Daily Starkes-Perry, Takia S, FNP      . nicotine (NICODERM CQ - dosed in mg/24 hours) patch 21 mg  21 mg Transdermal Daily Malvin JohnsFarah, Brian, MD   21 mg at 04/05/19 0805  . ondansetron (ZOFRAN-ODT) disintegrating tablet 4 mg  4 mg Oral Q6H PRN Starkes-Perry, Juel Burrowakia S, FNP      . thiamine (B-1) injection 100 mg  100 mg Intramuscular Once Caryn BeeStarkes-Perry, Takia S, FNP      . thiamine (VITAMIN B-1) tablet 100 mg  100 mg Oral Daily Maryagnes AmosStarkes-Perry, Takia S, FNP   100 mg at 04/05/19 0805  . traZODone (DESYREL) tablet 50 mg  50 mg Oral QHS  PRN,MR X 1 Nira ConnBerry, Jason A, NP       PTA Medications: Medications Prior to Admission  Medication Sig Dispense Refill Last Dose  . bictegravir-emtricitabine-tenofovir AF (BIKTARVY) 50-200-25 MG TABS tablet Take 1 tablet by mouth daily. 30 tablet 5   . busPIRone (BUSPAR) 5 MG tablet Take 1 tablet (5 mg total) by mouth 2 (two) times daily. For anxiety 14 tablet 0   . citalopram (CELEXA) 10 MG tablet Take 1 tablet (10 mg total) by mouth daily. For mood 7 tablet 0   . FLUoxetine (PROZAC) 20 MG capsule Take 1 capsule (20 mg total) by mouth daily. 7 capsule 0   . gabapentin (NEURONTIN) 100 MG capsule Take 1 capsule (100 mg total) by mouth 3 (three) times daily. For anxiety/agitation 21 capsule 0   . risperiDONE (RISPERDAL) 1 MG tablet Take 1 tablet (1 mg total) by mouth at bedtime. For hallucinations 7 tablet 0   . traZODone (DESYREL) 50 MG tablet Take 1 tablet (50 mg total) by mouth at bedtime as needed for sleep. 30 tablet 0     Patient Stressors: Health problems Substance abuse  Patient Strengths: Ability for insight Active sense of humor Average or above average intelligence  Treatment Modalities: Medication Management, Group therapy, Case management,  1 to 1 session with clinician, Psychoeducation, Recreational therapy.   Physician Treatment Plan for Primary Diagnosis: Schizoaffective disorder ([place]) Long Term Goal(s):     Short Term Goals:    Medication Management: Evaluate patient's response, side effects, and tolerance of medication regimen.  Therapeutic Interventions: 1 to 1 sessions, Unit Group sessions and Medication administration.  Evaluation of Outcomes: Progressing  Physician Treatment Plan for Secondary Diagnosis: Principal Problem:   Schizoaffective disorder ([place]) Active Problems:   MDD (major depressive disorder), recurrent severe, without psychosis ([place])  Long Term Goal(s):     Short Term Goals:       Medication Management: Evaluate patient's response, side  effects, and tolerance of medication regimen.  Therapeutic Interventions: 1 to 1 sessions, Unit Group sessions and Medication administration.  Evaluation of Outcomes: Progressing   RN Treatment Plan for Primary Diagnosis: Schizoaffective disorder ([place]) Long Term Goal(s): Knowledge of disease and therapeutic regimen to maintain health will improve  Short Term Goals: Ability to identify and develop effective coping behaviors will improve and Compliance with prescribed medications will improve  Medication Management: RN will administer medications as ordered by provider, will assess and evaluate patient's response and provide education to patient for prescribed medication. RN will report any adverse and/or side effects to prescribing provider.  Therapeutic Interventions: 1 on 1 counseling sessions, Psychoeducation, Medication administration, Evaluate responses to treatment, Monitor vital signs and CBGs as ordered, Perform/monitor CIWA, COWS, AIMS and Fall Risk screenings as ordered, Perform wound care treatments as ordered.  Evaluation of Outcomes: Progressing   LCSW Treatment Plan for Primary Diagnosis: Schizoaffective disorder ([place]) Long Term Goal(s): Safe transition to appropriate next level of care at discharge, Engage patient in therapeutic group addressing interpersonal concerns.  Short Term Goals: Engage patient in aftercare planning with referrals and resources, Increase social support, Identify triggers associated with mental health/substance abuse issues and Increase skills for wellness and recovery  Therapeutic Interventions: Assess for all discharge needs, 1 to 1 time with Social worker, Explore available resources and support systems, Assess for adequacy in community support network, Educate family and significant other(s) on suicide prevention, Complete Psychosocial Assessment, Interpersonal group therapy.  Evaluation of Outcomes: Progressing   Progress in  Treatment: Attending groups: Yes. Participating in groups: Yes. Taking medication as prescribed: Yes. Toleration medication: Yes. Family/Significant other contact made: No, will contact:  supports if consents are granted Patient understands diagnosis: Yes. Discussing patient identified problems/goals with staff: Yes. Medical problems stabilized or resolved: Yes. Denies suicidal/homicidal ideation: No. Issues/concerns per patient self-inventory: Yes.  New problem(s) identified: Yes, Describe:  homeless, no income or insurance, no supports.  New Short Term/Long Term Goal(s): detox, medication management for mood stabilization; elimination of SI thoughts; development of comprehensive mental wellness/sobriety plan.  Patient Goals:    Discharge Plan or Barriers: CSW continuing to assess for appropriate referrals.   Reason for Continuation of Hospitalization: Anxiety Depression Suicidal ideation  Estimated Length of Stay: 3-5 days  Attendees: Patient: 04/05/2019 9:51 AM  Physician:  04/05/2019 9:51 AM  Nursing:  04/05/2019 9:51 AM  RN Care Manager: 04/05/2019 9:51 AM  Social Worker: Stephanie Acre, [place] 04/05/2019 9:51 AM  Recreational Therapist:  04/05/2019 9:51 AM  Other:  04/05/2019 9:51 AM  Other:  04/05/2019 9:51 AM  Other: 04/05/2019 9:51 AM    Scribe for Treatment Team: Joellen Jersey, [place] 04/05/2019 9:51 AM

## 2019-04-05 NOTE — H&P (Signed)
Psychiatric Admission Assessment Adult  Patient Identification: Edwin Martinez Asselin MRN:  [id number] Date of Evaluation:  04/05/2019 Chief Complaint:  " I need to better my life" Principal Diagnosis: Schizoaffective Disorder Depressed versus Substance Induced Mood Disorder , Cocaine , Cannabis Use Disorder  Diagnosis:  Schizoaffective Disorder Depressed versus Substance Induced Mood Disorder , Cocaine , Cannabis Use Disorder  History of Present Illness: 33 year old male, [facility] , most recently in April, 2020. He presented to ED voluntarily reporting depression, suicidal ideations, with thoughts of walking into traffic. Reports neuro-vegetative symptoms as below. He also reports intermittent auditory hallucinations, such as hearing laughter and sometimes voices telling him " nobody cares ". States hallucinations occur mostly in the context of substance use, but also sometimes when sober . Reports chronic stressors, mainly homelessness. He also reports his cell phone recently broke , making it much harder to communicate with people . He also reports recent relapse on cocaine , cannabis, alcohol. Reports he was drinking 16-24 ounces of beer per day.Admission BAL (7/5) 35. Admission UDS positive for Cocaine, Cannabis, Amphetamines . He reports he has been off his psychiatric medications x 2 weeks after somebody stole them from him.   Associated Signs/Symptoms: Depression Symptoms:  depressed mood, insomnia, suicidal thoughts with specific plan, loss of energy/fatigue, (Hypo) Manic Symptoms:  None noted or reported  Anxiety Symptoms:  Reports increased anxiety, worry, mainly about stressors  Psychotic Symptoms:  Reports intermittent auditory hallucinations . Currently not internally preoccupied. No delusions expressed . PTSD Symptoms: Does not endorse Total Time spent with patient: 45 minutes  Past Psychiatric History: History of several prior psychiatric admissions  ,[facility] . Most recent admission was in April 2020. Has been diagnosed with Bipolar Disorder and Schizoaffective Disorder . He also reports history of substance use disorder, identifies cocaine and cannabis as substances of choice. History of suicide attempt by overdosing .    Is the patient at risk to self? Yes.    Has the patient been a risk to self in the past 6 months? Yes.    Has the patient been a risk to self within the distant past? Yes.    Is the patient a risk to others? No.  Has the patient been a risk to others in the past 6 months? No.  Has the patient been a risk to others within the distant past? No.   Prior Inpatient Therapy:   Prior Outpatient Therapy:    Alcohol Screening: 1. How often do you have a drink containing alcohol?: 2 to 3 times a week 2. How many drinks containing alcohol do you have on a typical day when you are drinking?: 3 or 4 3. How often do you have six or more drinks on one occasion?: Monthly AUDIT-C Score: 6 4. How often during the last year have you found that you were not able to stop drinking once you had started?: Monthly 5. How often during the last year have you failed to do what was normally expected from you becasue of drinking?: Monthly 6. How often during the last year have you needed a first drink in the morning to get yourself going after a heavy drinking session?: Less than monthly 7. How often during the last year have you had a feeling of guilt of remorse after drinking?: Less than monthly 8. How often during the last year have you been unable to remember what happened the night before because you had  been drinking?: Less than monthly 9. Have you or someone else been injured as a result of your drinking?: No 10. Has a relative or friend or a doctor or another health worker been concerned about your drinking or suggested you cut down?: Yes, during the last year Alcohol Use Disorder Identification Test Final Score  (AUDIT): 17 Alcohol Brief Interventions/Follow-up: Continued Monitoring Substance Abuse History in the last 12 months:  History of cocaine, cannabis, alcohol use disorder. Reports recent relapse on cocaine , cannabis and to a lesser degree on alcohol.  Consequences of Substance Abuse: Denies alcohol WDL seizures or blackouts.  Previous Psychotropic Medications: Was on Risperidone 1 mgr QHS, Prozac 20 mgrs QDAY, Neurontin 100 mgrs TID, Buspar 5 mgrs BID, Trazodone 50 mgrs QHS at last discharge [facility] in April.  States that more recently he had been started on Celexa.  Psychological Evaluations: no  Past Medical History: History of HIV (+) , Hep C (+) . Past Medical History:  Diagnosis Date  . ADD (attention deficit disorder)   . Anxiety   . Asthma   . Bipolar 1 disorder (HCC)   . Depression   . HIV (human immunodeficiency virus infection) (HCC) dx'd 2008  . Hypertension   . Insomnia   . Intentional drug overdose (HCC)    [name] 02/25/2018  . Polysubstance abuse (HCC)    [name] 02/25/2018  . Schizophrenia (HCC)   . Seizures (HCC)    "used to have little black-out szs where I'd drop out for 2-3 min then come back; nothing in the last 2-3-4years" (02/25/2018)  . Shingles     Past Surgical History:  Procedure Laterality Date  . DENTAL SURGERY     "had my eye teeth pulled down"   Family History: parents deceased, has one brother. Family History  Problem Relation Age of Onset  . Huntington's disease Father   . Heart disease Mother   . Suicidality Maternal Uncle   . Suicidality Maternal Grandmother    Family Psychiatric  History: Biological father had Huntington's Disease . Mother had history of depression. An uncle and grandmother completed suicide . Father had history of alcohol abuse. Tobacco Screening: Smokes 1/2 PPD  Social History: 33 year old male, single, no children, currently homeless, no current source of income. Upcoming court date .  Social History   Substance and  Sexual Activity  Alcohol Use Yes  . Alcohol/week: 4.0 standard drinks  . Types: 4 Cans of beer per week   Comment: occasionally, "socially"     Social History   Substance and Sexual Activity  Drug Use Yes  . Types: Marijuana, Cocaine    Additional Social History:  Allergies:   Allergies  Allergen Reactions  . Magnesium-Containing Compounds Other (See Comments)    This medication is contraindicated with pts HIV meds.    . Peanut-Containing Drug Products Anaphylaxis  . Esomeprazole Magnesium Cough  . Atripla [Efavirenz-Emtricitab-Tenofovir] Other (See Comments)    Reaction:  Suicidal thoughts   . Bactrim [Sulfamethoxazole-Trimethoprim]   . Bactrim [Sulfamethoxazole-Trimethoprim] Rash  . Penicillins Rash and Other (See Comments)    Has patient had a PCN reaction causing immediate rash, facial/tongue/throat swelling, SOB or lightheadedness with hypotension: Yes Has patient had a PCN reaction causing severe rash involving mucus membranes or skin necrosis: No Has patient had a PCN reaction that required hospitalization No Has patient had a PCN reaction occurring within the last 10 years: No If all of the above answers are "NO", then may proceed with Cephalosporin  use.   Lab Results: No results found for this or any previous visit (from the past 48 hour(s)).  Blood Alcohol level:  Lab Results  Component Value Date   ETH 31 (H) 04/02/2019   ETH 29 (H) 12/27/2018    Metabolic Disorder Labs:  Lab Results  Component Value Date   HGBA1C 5.7 (H) 01/18/2019   MPG 116.89 01/18/2019   MPG 114 12/24/2016   Lab Results  Component Value Date   PROLACTIN 28.8 (H) 12/24/2016   PROLACTIN 32.3 (H) 08/19/2016   Lab Results  Component Value Date   CHOL 142 03/22/2019   TRIG 52 03/22/2019   HDL 65 03/22/2019   CHOLHDL 2.2 03/22/2019   VLDL 26 12/24/2016   LDLCALC 64 03/22/2019   LDLCALC 74 08/31/2018    Current Medications: Current Facility-Administered Medications  Medication  Dose Route Frequency Provider Last Rate Last Dose  . acetaminophen (TYLENOL) tablet 650 mg  650 mg Oral Q6H PRN Maryagnes AmosStarkes-Perry, Takia S, FNP      . bictegravir-emtricitabine-tenofovir AF (BIKTARVY) 50-200-25 MG per tablet 1 tablet  1 tablet Oral Daily , Rockey SituFernando A, MD   1 tablet at 04/05/19 1147  . hydrOXYzine (ATARAX/VISTARIL) tablet 25 mg  25 mg Oral Q6H PRN Maryagnes AmosStarkes-Perry, Takia S, FNP      . loperamide (IMODIUM) capsule 2-4 mg  2-4 mg Oral PRN Rosario AdieStarkes-Perry, Juel Burrowakia S, FNP      . LORazepam (ATIVAN) tablet 1 mg  1 mg Oral Q6H PRN Maryagnes AmosStarkes-Perry, Takia S, FNP      . LORazepam (ATIVAN) tablet 1 mg  1 mg Oral QID Maryagnes AmosStarkes-Perry, Takia S, FNP   1 mg at 04/05/19 1147   Followed by  . [START ON 04/06/2019] LORazepam (ATIVAN) tablet 1 mg  1 mg Oral TID Maryagnes AmosStarkes-Perry, Takia S, FNP       Followed by  . [START ON 04/07/2019] LORazepam (ATIVAN) tablet 1 mg  1 mg Oral BID Maryagnes AmosStarkes-Perry, Takia S, FNP       Followed by  . [START ON 04/08/2019] LORazepam (ATIVAN) tablet 1 mg  1 mg Oral Daily Starkes-Perry, Takia S, FNP      . nicotine (NICODERM CQ - dosed in mg/24 hours) patch 21 mg  21 mg Transdermal Daily Malvin JohnsFarah, Brian, MD   21 mg at 04/05/19 0805  . ondansetron (ZOFRAN-ODT) disintegrating tablet 4 mg  4 mg Oral Q6H PRN Starkes-Perry, Juel Burrowakia S, FNP      . thiamine (B-1) injection 100 mg  100 mg Intramuscular Once Caryn BeeStarkes-Perry, Takia S, FNP      . thiamine (VITAMIN B-1) tablet 100 mg  100 mg Oral Daily Maryagnes AmosStarkes-Perry, Takia S, FNP   100 mg at 04/05/19 0805  . traZODone (DESYREL) tablet 50 mg  50 mg Oral QHS PRN,MR X 1 Nira ConnBerry, Jason A, NP       PTA Medications: Medications Prior to Admission  Medication Sig Dispense Refill Last Dose  . bictegravir-emtricitabine-tenofovir AF (BIKTARVY) 50-200-25 MG TABS tablet Take 1 tablet by mouth daily. 30 tablet 5   . busPIRone (BUSPAR) 5 MG tablet Take 1 tablet (5 mg total) by mouth 2 (two) times daily. For anxiety 14 tablet 0   . citalopram (CELEXA) 10 MG tablet Take 1  tablet (10 mg total) by mouth daily. For mood 7 tablet 0   . FLUoxetine (PROZAC) 20 MG capsule Take 1 capsule (20 mg total) by mouth daily. 7 capsule 0   . gabapentin (NEURONTIN) 100 MG capsule Take 1 capsule (100 mg total) by  mouth 3 (three) times daily. For anxiety/agitation 21 capsule 0   . risperiDONE (RISPERDAL) 1 MG tablet Take 1 tablet (1 mg total) by mouth at bedtime. For hallucinations 7 tablet 0   . traZODone (DESYREL) 50 MG tablet Take 1 tablet (50 mg total) by mouth at bedtime as needed for sleep. 30 tablet 0     Musculoskeletal: Strength & Muscle Tone: within normal limits no tremors, no diaphoresis, no restlessness or agitation, vitals are currently stable Gait & Station: normal Patient leans: N/A  Psychiatric Specialty Exam: Physical Exam  Review of Systems  Constitutional: Negative for chills and fever.  HENT: Negative.   Eyes: Negative.   Respiratory: Negative.   Cardiovascular: Negative for chest pain.  Gastrointestinal: Negative for diarrhea, nausea and vomiting.  Genitourinary: Negative.   Musculoskeletal: Negative.   Skin: Negative.  Negative for rash.  Neurological: Negative for seizures and headaches.  Endo/Heme/Allergies: Negative.   Psychiatric/Behavioral: Positive for depression, hallucinations, substance abuse and suicidal ideas.  All other systems reviewed and are negative.   Blood pressure 122/78, pulse 82, temperature 97.8 F (36.6 C), temperature source Oral, resp. rate 16, height 5\' 7"  (1.702 m), weight 64 kg, SpO2 98 %.Body mass index is 22.1 kg/m.  General Appearance: Fairly Groomed  Eye Contact:  Good  Speech:  Normal Rate  Volume:  Normal  Mood:  reports depression  Affect:  calm, vaguely constricted   Thought Process:  Linear and Descriptions of Associations: Intact  Orientation:  Other:  fully alert and attentive  Thought Content:  reports intermittent auditory hallucinations, none today. Currently not internally preoccupied, no delusions  are expressed   Suicidal Thoughts:  No denies suicidal or self injurious ideations, denies homicidal or violent ideations , contracts for safety on unit at this time  Homicidal Thoughts:  No  Memory:  recent and remote grossly intact   Judgement:  Fair  Insight:  Fair  Psychomotor Activity:  Normal  Concentration:  Concentration: Good and Attention Span: Good  Recall:  Good  Fund of Knowledge:  Good  Language:  Good  Akathisia:  Negative  Handed:  Right  AIMS (if indicated):     Assets:  Desire for Improvement Resilience  ADL's:  Intact  Cognition:  WNL  Sleep:  Number of Hours: 5.25    Treatment Plan Summary: Daily contact with patient to assess and evaluate symptoms and progress in treatment, Medication management, Plan inpatient treatment  and medications as below  Observation Level/Precautions:  15 minute checks  Laboratory:  as needed   Psychotherapy:  Milieu, group therapy   Medications:  We discussed medication options. Patient states most recent antidepressant management was Celexa which had replaced Prozac. Prefers to continue Celexa at this time.  Continue Celexa 10 mgrs QDAY Continue  Risperidone 1 mgr QHS Continue Neurontin 100 mgrs TID Of note, patient reports he has been taking Biktarvy for antiretroviral management with good response and without side effects . Will resume.     Consultations:  As needed   Discharge Concerns:    Estimated LOS:4-5  Days   Other:     Physician Treatment Plan for Primary Diagnosis: Schizoaffective disorder ([place]) versus Substance Induced Mood Disorder  Long Term Goal(s): Improvement in symptoms so as ready for discharge  Short Term Goals: Ability to identify changes in lifestyle to reduce recurrence of condition will improve, Ability to verbalize feelings will improve, Ability to disclose and discuss suicidal ideas, Ability to demonstrate self-control will improve, Ability to identify and develop  effective coping behaviors will  improve and Ability to maintain clinical measurements within normal limits will improve  Physician Treatment Plan for Secondary Diagnosis: Cocaine Use Disorder, Cannabis Use Disorder Long Term Goal(s): Improvement in symptoms so as ready for discharge  Short Term Goals: Ability to identify changes in lifestyle to reduce recurrence of condition will improve, Ability to verbalize feelings will improve, Ability to disclose and discuss suicidal ideas, Ability to demonstrate self-control will improve, Ability to identify and develop effective coping behaviors will improve and Ability to maintain clinical measurements within normal limits will improve  I certify that inpatient services furnished can reasonably be expected to improve the patient's condition.    Craige CottaFernando A , MD 7/8/20203:25 PM
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
## 2019-04-05 NOTE — Progress Notes (Addendum)
D: Pt denies SI/HI/AVH. Pt is pleasant and cooperative. Pt visible in the dayroom this evening. Pt stated he was doing better.  A: Pt was offered support and encouragement. Pt was given scheduled medications. Pt was encourage to attend groups. Q 15 minute checks were done for safety.  R:Pt attends groups and interacts well with peers and staff. Pt is taking medication. Pt has no complaints.Pt receptive to treatment and safety maintained on unit.

## 2019-04-05 NOTE — BHH Suicide Risk Assessment (Addendum)
[facility] Admission Suicide Risk Assessment   Nursing information obtained from:  Patient Demographic factors:  Male, Abner Greenspan, lesbian, or bisexual orientation, Low socioeconomic status, Unemployed Current Mental Status:  Suicidal ideation indicated by patient Loss Factors:  Decrease in vocational status, Decline in physical health, Financial problems / change in socioeconomic status Historical Factors:  Prior suicide attempts, Impulsivity Risk Reduction Factors:  NA  Total Time spent with patient: 45 minutes Principal Problem: Schizoaffective disorder ([place]) Diagnosis:  Principal Problem:   Schizoaffective disorder ([place]) Active Problems:   MDD (major depressive disorder), recurrent severe, without psychosis ([place])  Subjective Data:   Continued Clinical Symptoms:  Alcohol Use Disorder Identification Test Final Score (AUDIT): 17 The "Alcohol Use Disorders Identification Test", Guidelines for Use in Primary Care, Second Edition.  World Pharmacologist [facility]). Score between 0-7:  no or low risk or alcohol related problems. Score between 8-15:  moderate risk of alcohol related problems. Score between 16-19:  high risk of alcohol related problems. Score 20 or above:  warrants further diagnostic evaluation for alcohol dependence and treatment.   CLINICAL FACTORS:  33 year old male, known to [facility] from prior admissions, presented to hospital voluntarily reporting worsening depression, suicidal ideations of walking into traffic, neuro-vegetative symptoms, intermittent auditory hallucinations. Reports recent relapse on cocaine, cannabis, and alcohol, although states has been drinking only 1-2 beers per day. Also reports he has been off his medications for about two weeks.  Identifies homelessness as major stressor.     Psychiatric Specialty Exam: Physical Exam  ROS  Blood pressure 122/78, pulse 82, temperature 97.8 F (36.6 C), temperature source Oral, resp. rate 16, height 5\' 7"  (1.702 m), weight  64 kg, SpO2 98 %.Body mass index is 22.1 kg/m.  See admit note MSE   COGNITIVE FEATURES THAT CONTRIBUTE TO RISK:  Closed-mindedness and Loss of executive function    SUICIDE RISK:   Moderate:  Frequent suicidal ideation with limited intensity, and duration, some specificity in terms of plans, no associated intent, good self-control, limited dysphoria/symptomatology, some risk factors present, and identifiable protective factors, including available and accessible social support.  PLAN OF CARE: Patient will be admitted to inpatient psychiatric unit for stabilization and safety. Will provide and encourage milieu participation. Provide medication management and maked adjustments as needed.  Will follow daily.    I certify that inpatient services furnished can reasonably be expected to improve the patient's condition.   Jenne Campus, MD 04/05/2019, 3:58 PM

## 2019-04-06 MED ORDER — GABAPENTIN 100 MG PO CAPS
200.0000 mg | ORAL_CAPSULE | Freq: Three times a day (TID) | ORAL | Status: DC
  Administered 2019-04-07 – 2019-04-08 (×4): 200 mg via ORAL
  Filled 2019-04-06 (×7): qty 2

## 2019-04-06 NOTE — Progress Notes (Signed)
[facility] MD Progress Note  04/06/2019 5:08 PM Edwin Martinez  MRN:  [id number] Subjective:  Patient reports " I guess I am all right today". Currently denies suicidal ideations. Denies medication side effects. Presents future oriented . Objective : I have reviewed case with treatment team and have met with patient. 33 year old male, known to [facility] from prior admissions, presented to hospital voluntarily reporting worsening depression, suicidal ideations of walking into traffic, neuro-vegetative symptoms, intermittent auditory hallucinations. Reports recent relapse on cocaine, cannabis, and alcohol, although states has been drinking only 1-2 beers per day. Also reports he has been off his medications for about two weeks.  Identifies homelessness as major stressor.  Patient presents calm , polite on approach, reports he is feeling " all right", presents with vaguely blunted /restricted affect . Remains ruminative about stressors related to homelessness, having lost his cell phone. He is future oriented and states he is hoping there would be community services or programs where he could procure a cell phone .  No disruptive or agitated behaviors on unit. Denies suicidal ideations at this time and contracts for safety. Denies medication side effects. Principal Problem: Schizoaffective disorder ([place]) Diagnosis: Principal Problem:   Schizoaffective disorder ([place]) Active Problems:   MDD (major depressive disorder), recurrent severe, without psychosis ([place])  Total Time spent with patient: 15 minutes  Past Psychiatric History:   Past Medical History:  Past Medical History:  Diagnosis Date  . ADD (attention deficit disorder)   . Anxiety   . Asthma   . Bipolar 1 disorder ([place])   . Depression   . HIV (human immunodeficiency virus infection) ([place]) dx'd 2008  . Hypertension   . Insomnia   . Intentional drug overdose ([place])    Archie Endo 02/25/2018  . Polysubstance abuse ([place])    Archie Endo 02/25/2018  .  Schizophrenia ([place])   . Seizures ([place])    "used to have little black-out szs where I'd drop out for 2-3 min then come back; nothing in the last 2-3-4years" (02/25/2018)  . Shingles     Past Surgical History:  Procedure Laterality Date  . DENTAL SURGERY     "had my eye teeth pulled down"   Family History:  Family History  Problem Relation Age of Onset  . Huntington's disease Father   . Heart disease Mother   . Suicidality Maternal Uncle   . Suicidality Maternal Grandmother    Family Psychiatric  History:  Social History:  Social History   Substance and Sexual Activity  Alcohol Use Yes  . Alcohol/week: 4.0 standard drinks  . Types: 4 Cans of beer per week   Comment: occasionally, "socially"     Social History   Substance and Sexual Activity  Drug Use Yes  . Types: Marijuana, Cocaine    Social History   Socioeconomic History  . Marital status: Single    Spouse name: Not on file  . Number of children: Not on file  . Years of education: Not on file  . Highest education level: Not on file  Occupational History  . Occupation: disability pending  Social Needs  . Financial resource strain: Not on file  . Food insecurity    Worry: Not on file    Inability: Not on file  . Transportation needs    Medical: Not on file    Non-medical: Not on file  Tobacco Use  . Smoking status: Current Every Day Smoker    Packs/day: 0.50    Years: 20.00  Pack years: 10.00    Types: Cigarettes    Start date: 09/29/1991  . Smokeless tobacco: Never Used  Substance and Sexual Activity  . Alcohol use: Yes    Alcohol/week: 4.0 standard drinks    Types: 4 Cans of beer per week    Comment: occasionally, "socially"  . Drug use: Yes    Types: Marijuana, Cocaine  . Sexual activity: Yes    Birth control/protection: Condom  Lifestyle  . Physical activity    Days per week: Not on file    Minutes per session: Not on file  . Stress: Not on file  Relationships  . Social Product manager on phone: Not on file    Gets together: Not on file    Attends religious service: Not on file    Active member of club or organization: Not on file    Attends meetings of clubs or organizations: Not on file    Relationship status: Not on file  Other Topics Concern  . Not on file  Social History Narrative   ** Merged History Encounter **       ** Merged History Encounter **       Additional Social History:   Sleep: Good  Appetite:  Good  Current Medications: Current Facility-Administered Medications  Medication Dose Route Frequency Provider Last Rate Last Dose  . acetaminophen (TYLENOL) tablet 650 mg  650 mg Oral Q6H PRN Suella Broad, FNP      . bictegravir-emtricitabine-tenofovir AF (BIKTARVY) 50-200-25 MG per tablet 1 tablet  1 tablet Oral Daily Randle Shatzer, Myer Peer, MD   1 tablet at 04/06/19 0809  . citalopram (CELEXA) tablet 10 mg  10 mg Oral Daily Rhilee Currin, Myer Peer, MD   10 mg at 04/06/19 0809  . gabapentin (NEURONTIN) capsule 100 mg  100 mg Oral TID Husna Krone, Myer Peer, MD   100 mg at 04/06/19 1216  . hydrOXYzine (ATARAX/VISTARIL) tablet 25 mg  25 mg Oral Q6H PRN Suella Broad, FNP   25 mg at 04/05/19 2221  . LORazepam (ATIVAN) tablet 1 mg  1 mg Oral Q6H PRN Starkes-Perry, Gayland Curry, FNP      . nicotine (NICODERM CQ - dosed in mg/24 hours) patch 21 mg  21 mg Transdermal Daily Johnn Hai, MD   21 mg at 04/05/19 0805  . risperiDONE (RISPERDAL) tablet 1 mg  1 mg Oral QHS Talen Poser, Myer Peer, MD   1 mg at 04/05/19 2221  . thiamine (B-1) injection 100 mg  100 mg Intramuscular Once Sheran Fava S, FNP      . thiamine (VITAMIN B-1) tablet 100 mg  100 mg Oral Daily Suella Broad, FNP   100 mg at 04/06/19 0809  . traZODone (DESYREL) tablet 50 mg  50 mg Oral QHS PRN Jaidyn Usery, Myer Peer, MD   50 mg at 04/05/19 2222    Lab Results: No results found for this or any previous visit (from the past 4 hour(s)).  Blood Alcohol level:  Lab Results  Component  Value Date   ETH 31 (H) 04/02/2019   ETH 29 (H) 15/52/0802    Metabolic Disorder Labs: Lab Results  Component Value Date   HGBA1C 5.7 (H) 01/18/2019   MPG 116.89 01/18/2019   MPG 114 12/24/2016   Lab Results  Component Value Date   PROLACTIN 28.8 (H) 12/24/2016   PROLACTIN 32.3 (H) 08/19/2016   Lab Results  Component Value Date   CHOL 142 03/22/2019   TRIG 52  03/22/2019   HDL 65 03/22/2019   CHOLHDL 2.2 03/22/2019   VLDL 26 12/24/2016   LDLCALC 64 03/22/2019   LDLCALC 74 08/31/2018    Physical Findings: AIMS: Facial and Oral Movements Muscles of Facial Expression: None, normal Lips and Perioral Area: None, normal Jaw: None, normal Tongue: None, normal,Extremity Movements Upper (arms, wrists, hands, fingers): None, normal Lower (legs, knees, ankles, toes): None, normal, Trunk Movements Neck, shoulders, hips: None, normal, Overall Severity Severity of abnormal movements (highest score from questions above): None, normal Incapacitation due to abnormal movements: None, normal Patient's awareness of abnormal movements (rate only patient's report): No Awareness, Dental Status Current problems with teeth and/or dentures?: No Does patient usually wear dentures?: No  CIWA:  CIWA-Ar Total: 0 COWS:     Musculoskeletal: Strength & Muscle Tone: within normal limits no restlessness or psychomotor agitation Gait & Station: normal Patient leans: N/A  Psychiatric Specialty Exam: Physical Exam  ROS denies chest pain or shortness of breath, no cough, no vomiting   Blood pressure 107/71, pulse (!) 55, temperature 98.1 F (36.7 C), temperature source Oral, resp. rate 18, height 5' 7"  (1.702 m), weight 64 kg, SpO2 100 %.Body mass index is 22.1 kg/m.  General Appearance: Fairly Groomed  Eye Contact:  Fair  Speech:  Normal Rate  Volume:  Normal  Mood:  reports feeling " all right"  Affect:  tends to be restricted, blunted, does smile briefly at times  Thought Process:  Linear  and Descriptions of Associations: Intact  Orientation:  Other:  fully alert and attentive  Thought Content:  denies hallucinations at this time, no delusions are expressed   Suicidal Thoughts:  No denies suicidal or self injurious ideations, denies homicidal or violent ideations  Homicidal Thoughts:  No  Memory:  recent and remtoe grossly intact   Judgement:  Fair/ improving  Insight:  Fair  Psychomotor Activity:  Normal  Concentration:  Concentration: Good and Attention Span: Good  Recall:  Good  Fund of Knowledge:  Good  Language:  Good  Akathisia:  Negative  Handed:  Right  AIMS (if indicated):     Assets:  Communication Skills Desire for Improvement Resilience  ADL's:  Intact  Cognition:  WNL  Sleep:  Number of Hours: 6.25   Assessment -  33 year old male, known to [facility] from prior admissions, presented to hospital voluntarily reporting worsening depression, suicidal ideations of walking into traffic, neuro-vegetative symptoms, intermittent auditory hallucinations. Reports recent relapse on cocaine, cannabis, and alcohol, although states has been drinking only 1-2 beers per day. Also reports he has been off his medications for about two weeks.  Identifies homelessness as major stressor.  Currently patient reports improvement compared to admission . Denies current psychotic symptoms and does not appear internally preoccupied. Denies suicidal ideations. Affect tends to be blunted and constricted and does continue to focus/ruminate about psychosocial stressors associated with homelessness . Tolerating medications well , denies side effects.   Treatment Plan Summary: Daily contact with patient to assess and evaluate symptoms and progress in treatment, Medication management, Plan inpatient treatment and medications as below Encourage group and milieu participation  Encourage efforts to work on sobriety and relapse prevention Continue Celexa 10 mgrs QDAY for depression, anxiety Continue  Risperidone 1 mgr QHS for mood disorder/psychosis Increase Neurontin to 200 mgrs TID for anxiety, pain Continue Trazodone 50 mgrs QHS PRN for insomnia as needed  Continue Biktarvy for HIV management  Treatment team working on disposition planning options   Felicita Gage A  Kenitha Glendinning, MD 04/06/2019, 5:08 PM

## 2019-04-06 NOTE — Plan of Care (Signed)
Nursing Progress Note [phone]  Patient presents pleasant and cooperative during interactions. Patient's affect brightens throughout the day. Patient compliant with scheduled medications and denies need for PRNs. Patient is seen attending groups and visible in the milieu. Patient currently denies SI/HI/AVH or withdrawal symptoms.  Patient is educated about and provided medication per provider's orders. Patient safety maintained with q15 min safety checks and high fall risk precautions. Emotional support given, 1:1 interaction, and active listening provided. Patient encouraged to attend meals, groups, and work on treatment plan and goals. Labs, vital signs and patient behavior monitored throughout shift. Patient encouraged to wear mask when in the milieu and is educated about coronavirus infection control precautions.  Patient compliant with wearing mask on the unit. Patient contracts for safety with staff. Patient remains safe on the unit at this time and agrees to come to staff with any issues/concerns. Patient is interacting with peers appropriately on the unit. Will continue to support and monitor.   Problem: Activity: Goal: Interest or engagement in activities will improve Outcome: Progressing   Problem: Coping: Goal: Ability to demonstrate self-control will improve Outcome: Progressing   Problem: Health Behavior/Discharge Planning: Goal: Compliance with treatment plan for underlying cause of condition will improve Outcome: Progressing   Problem: Physical Regulation: Goal: Ability to maintain clinical measurements within normal limits will improve Outcome: Progressing   Problem: Safety: Goal: Periods of time without injury will increase Outcome: Progressing   [place] NOVEL CORONAVIRUS (COVID-19) DAILY CHECK-OFF SYMPTOMS - answer yes or no to each - every day NO YES  Have you had a fever in the past 24 hours?  Fever (Temp > 37.80C / 100F) X   Have you had any of these  symptoms in the past 24 hours? New Cough  Sore Throat   Shortness of Breath  Difficulty Breathing  Unexplained Body Aches   X   Have you had any one of these symptoms in the past 24 hours not related to allergies?   Runny Nose  Nasal Congestion  Sneezing   X   If you have had runny nose, nasal congestion, sneezing in the past 24 hours, has it worsened?  X   EXPOSURES - check yes or no X   Have you traveled outside the state in the past 14 days?  X   Have you been in contact with someone with a confirmed diagnosis of COVID-19 or PUI in the past 14 days without wearing appropriate PPE?  X   Have you been living in the same home as a person with confirmed diagnosis of COVID-19 or a PUI (household contact)?    X   Have you been diagnosed with COVID-19?    X              What to do next: Answered NO to all: Answered YES to anything:   Proceed with unit schedule Follow the [facility] Inpatient Flowsheet.

## 2019-04-06 NOTE — Plan of Care (Signed)
D: Pt denies SI/HI/AVH. Pt is pleasant and cooperative. Pt stated he was doing ok, pt visible in the dayroom watching TV with peers.  A: Pt was offered support and encouragement. Pt was given scheduled medications. Pt was encourage to attend groups. Q 15 minute checks were done for safety.  R:Pt attends groups and interacts well with peers and staff. Pt is taking medication. Pt has no complaints.Pt receptive to treatment and safety maintained on unit.  Problem: Education: Goal: Emotional status will improve Outcome: Progressing   Problem: Education: Goal: Mental status will improve Outcome: Progressing

## 2019-04-07 NOTE — Progress Notes (Signed)
D Pt is observed OOB UAL on the 300 hall today- he tolerates this fairly well. HE is loud, flamboyant, almost grandiose in his nature. He wears hospital-issued patient scrubs, that he slept in. His hair and appearance are disheveled. His mannerisms are manic-like, extreme in nature.     A HE completed his daily assessment and on this he wrote he denied having SI today. HE ratted his depression, hopelessness and anxiety " 5/5/5/", respectively. He says " Im glad I came here..I knew I was in a bad place and I needed to come here and get back on my meds".  Pt is offered encouragement by this Probation officer.     R Safety in place.

## 2019-04-07 NOTE — Progress Notes (Signed)
[place] NOVEL CORONAVIRUS (COVID-19) DAILY CHECK-OFF SYMPTOMS - answer yes or no to each - every day NO YES  Have you had a fever in the past 24 hours?  . Fever (Temp > 37.80C / 100F) X   Have you had any of these symptoms in the past 24 hours? . New Cough .  Sore Throat  .  Shortness of Breath .  Difficulty Breathing .  Unexplained Body Aches   X   Have you had any one of these symptoms in the past 24 hours not related to allergies?   . Runny Nose .  Nasal Congestion .  Sneezing   X   If you have had runny nose, nasal congestion, sneezing in the past 24 hours, has it worsened?  X   EXPOSURES - check yes or no X   Have you traveled outside the state in the past 14 days?  X   Have you been in contact with someone with a confirmed diagnosis of COVID-19 or PUI in the past 14 days without wearing appropriate PPE?  X   Have you been living in the same home as a person with confirmed diagnosis of COVID-19 or a PUI (household contact)?    X   Have you been diagnosed with COVID-19?    X              What to do next: Answered NO to all: Answered YES to anything:   Proceed with unit schedule Follow the [facility] Inpatient Flowsheet.   
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
## 2019-04-07 NOTE — Plan of Care (Signed)
  Problem: Education: Goal: Emotional status will improve Outcome: Not Progressing Goal: Mental status will improve Outcome: Not Progressing   

## 2019-04-08 MED ORDER — GABAPENTIN 300 MG PO CAPS
300.0000 mg | ORAL_CAPSULE | Freq: Three times a day (TID) | ORAL | Status: DC
  Administered 2019-04-08 – 2019-04-10 (×7): 300 mg via ORAL
  Filled 2019-04-08 (×2): qty 1
  Filled 2019-04-08: qty 21
  Filled 2019-04-08 (×7): qty 1
  Filled 2019-04-08 (×2): qty 21

## 2019-04-08 MED ORDER — TRAZODONE HCL 50 MG PO TABS
50.0000 mg | ORAL_TABLET | Freq: Once | ORAL | Status: AC
  Administered 2019-04-08: 22:00:00 50 mg via ORAL
  Filled 2019-04-08: qty 1

## 2019-04-08 MED ORDER — TRAZODONE HCL 100 MG PO TABS
100.0000 mg | ORAL_TABLET | Freq: Every evening | ORAL | Status: DC | PRN
  Administered 2019-04-09: 21:00:00 100 mg via ORAL
  Filled 2019-04-08: qty 7
  Filled 2019-04-08: qty 1

## 2019-04-08 NOTE — Progress Notes (Signed)
[place] NOVEL CORONAVIRUS (COVID-19) DAILY CHECK-OFF SYMPTOMS - answer yes or no to each - every day NO YES  Have you had a fever in the past 24 hours?  . Fever (Temp > 37.80C / 100F) X   Have you had any of these symptoms in the past 24 hours? . New Cough .  Sore Throat  .  Shortness of Breath .  Difficulty Breathing .  Unexplained Body Aches   X   Have you had any one of these symptoms in the past 24 hours not related to allergies?   . Runny Nose .  Nasal Congestion .  Sneezing   X   If you have had runny nose, nasal congestion, sneezing in the past 24 hours, has it worsened?  X   EXPOSURES - check yes or no X   Have you traveled outside the state in the past 14 days?  X   Have you been in contact with someone with a confirmed diagnosis of COVID-19 or PUI in the past 14 days without wearing appropriate PPE?  X   Have you been living in the same home as a person with confirmed diagnosis of COVID-19 or a PUI (household contact)?    X   Have you been diagnosed with COVID-19?    X              What to do next: Answered NO to all: Answered YES to anything:   Proceed with unit schedule Follow the [facility] Inpatient Flowsheet.   

## 2019-04-08 NOTE — Progress Notes (Addendum)
[facility] MD Progress Note  04/08/2019 9:20 AM Khoury Siemon  MRN:  [id number] Subjective: Patient reports improvement compared to admission.  He continues to ruminate about limited resources/homelessness, and states that after losing his cell phone recently it is been much harder for him to keep appointments, maintain social contacts.  He acknowledges some lingering depression because of this but overall states he is feeling better.  Denies suicidal ideations.  Objective : I have reviewed chart notes and have met with patient. 33 year old male, known to [facility] from prior admissions, presented to hospital voluntarily reporting worsening depression, suicidal ideations of walking into traffic, neuro-vegetative symptoms, intermittent auditory hallucinations. Reports recent relapse on cocaine, cannabis, and alcohol, although states has been drinking only 1-2 beers per day. Also reports he has been off his medications for about two weeks.  Identifies homelessness as major stressor.  Patient presents alert, attentive, calm, pleasant on approach.  Describes lingering depression/anxiety related to stressors but acknowledges improvement.  Today affect appears full in range.  No hallucinations at this time and does not present with symptoms of psychosis.  Currently denies suicidal ideations. Behavior on unit in good control, no disruptive or agitated behaviors  Principal Problem: Schizoaffective disorder ([place]) Diagnosis: Principal Problem:   Schizoaffective disorder ([place]) Active Problems:   MDD (major depressive disorder), recurrent severe, without psychosis ([place])  Total Time spent with patient: 15 minutes  Past Psychiatric History:   Past Medical History:  Past Medical History:  Diagnosis Date  . ADD (attention deficit disorder)   . Anxiety   . Asthma   . Bipolar 1 disorder ([place])   . Depression   . HIV (human immunodeficiency virus infection) ([place]) dx'd 2008  . Hypertension   . Insomnia   .  Intentional drug overdose ([place])    Archie Endo 02/25/2018  . Polysubstance abuse ([place])    Archie Endo 02/25/2018  . Schizophrenia ([place])   . Seizures ([place])    "used to have little black-out szs where I'd drop out for 2-3 min then come back; nothing in the last 2-3-4years" (02/25/2018)  . Shingles     Past Surgical History:  Procedure Laterality Date  . DENTAL SURGERY     "had my eye teeth pulled down"   Family History:  Family History  Problem Relation Age of Onset  . Huntington's disease Father   . Heart disease Mother   . Suicidality Maternal Uncle   . Suicidality Maternal Grandmother    Family Psychiatric  History:  Social History:  Social History   Substance and Sexual Activity  Alcohol Use Yes  . Alcohol/week: 4.0 standard drinks  . Types: 4 Cans of beer per week   Comment: occasionally, "socially"     Social History   Substance and Sexual Activity  Drug Use Yes  . Types: Marijuana, Cocaine    Social History   Socioeconomic History  . Marital status: Single    Spouse name: Not on file  . Number of children: Not on file  . Years of education: Not on file  . Highest education level: Not on file  Occupational History  . Occupation: disability pending  Social Needs  . Financial resource strain: Not on file  . Food insecurity    Worry: Not on file    Inability: Not on file  . Transportation needs    Medical: Not on file    Non-medical: Not on file  Tobacco Use  . Smoking status: Current Every Day Smoker    Packs/day:  0.50    Years: 20.00    Pack years: 10.00    Types: Cigarettes    Start date: 09/29/1991  . Smokeless tobacco: Never Used  Substance and Sexual Activity  . Alcohol use: Yes    Alcohol/week: 4.0 standard drinks    Types: 4 Cans of beer per week    Comment: occasionally, "socially"  . Drug use: Yes    Types: Marijuana, Cocaine  . Sexual activity: Yes    Birth control/protection: Condom  Lifestyle  . Physical activity    Days per week: Not on file     Minutes per session: Not on file  . Stress: Not on file  Relationships  . Social Herbalist on phone: Not on file    Gets together: Not on file    Attends religious service: Not on file    Active member of club or organization: Not on file    Attends meetings of clubs or organizations: Not on file    Relationship status: Not on file  Other Topics Concern  . Not on file  Social History Narrative   ** Merged History Encounter **       ** Merged History Encounter **       Additional Social History:   Sleep: Fair  Appetite:  Good  Current Medications: Current Facility-Administered Medications  Medication Dose Route Frequency Provider Last Rate Last Dose  . acetaminophen (TYLENOL) tablet 650 mg  650 mg Oral Q6H PRN Suella Broad, FNP      . bictegravir-emtricitabine-tenofovir AF (BIKTARVY) 50-200-25 MG per tablet 1 tablet  1 tablet Oral Daily Cobos, Myer Peer, MD   1 tablet at 04/08/19 3794  . citalopram (CELEXA) tablet 10 mg  10 mg Oral Daily Cobos, Myer Peer, MD   10 mg at 04/08/19 3276  . gabapentin (NEURONTIN) capsule 200 mg  200 mg Oral TID Cobos, Myer Peer, MD   200 mg at 04/08/19 1470  . nicotine (NICODERM CQ - dosed in mg/24 hours) patch 21 mg  21 mg Transdermal Daily Johnn Hai, MD   21 mg at 04/08/19 0824  . risperiDONE (RISPERDAL) tablet 1 mg  1 mg Oral QHS Cobos, Myer Peer, MD   1 mg at 04/07/19 2116  . thiamine (B-1) injection 100 mg  100 mg Intramuscular Once Sheran Fava S, FNP      . thiamine (VITAMIN B-1) tablet 100 mg  100 mg Oral Daily Suella Broad, FNP   100 mg at 04/08/19 9295  . traZODone (DESYREL) tablet 50 mg  50 mg Oral QHS PRN Cobos, Myer Peer, MD   50 mg at 04/07/19 2116    Lab Results: No results found for this or any previous visit (from the past 48 hour(s)).  Blood Alcohol level:  Lab Results  Component Value Date   ETH 31 (H) 04/02/2019   ETH 29 (H) 74/73/4037    Metabolic Disorder Labs: Lab  Results  Component Value Date   HGBA1C 5.7 (H) 01/18/2019   MPG 116.89 01/18/2019   MPG 114 12/24/2016   Lab Results  Component Value Date   PROLACTIN 28.8 (H) 12/24/2016   PROLACTIN 32.3 (H) 08/19/2016   Lab Results  Component Value Date   CHOL 142 03/22/2019   TRIG 52 03/22/2019   HDL 65 03/22/2019   CHOLHDL 2.2 03/22/2019   VLDL 26 12/24/2016   LDLCALC 64 03/22/2019   LDLCALC 74 08/31/2018    Physical Findings: AIMS: Facial and Oral Movements  Muscles of Facial Expression: None, normal Lips and Perioral Area: None, normal Jaw: None, normal Tongue: None, normal,Extremity Movements Upper (arms, wrists, hands, fingers): None, normal Lower (legs, knees, ankles, toes): None, normal, Trunk Movements Neck, shoulders, hips: None, normal, Overall Severity Severity of abnormal movements (highest score from questions above): None, normal Incapacitation due to abnormal movements: None, normal Patient's awareness of abnormal movements (rate only patient's report): No Awareness, Dental Status Current problems with teeth and/or dentures?: No Does patient usually wear dentures?: No  CIWA:  CIWA-Ar Total: 0 COWS:     Musculoskeletal: Strength & Muscle Tone: within normal limits no restlessness or psychomotor agitation Gait & Station: normal Patient leans: N/A  Psychiatric Specialty Exam: Physical Exam  ROS denies chest pain or shortness of breath, no cough, no vomiting   Blood pressure 104/87, pulse 62, temperature 98.4 F (36.9 C), temperature source Oral, resp. rate 18, height 5' 7"  (1.702 m), weight 64 kg, SpO2 100 %.Body mass index is 22.1 kg/m.  General Appearance: Improved grooming  Eye Contact:  Good  Speech:  Normal Rate  Volume:  Normal  Mood:  Describes some lingering depression/anxiety but states that overall he is improved  Affect:  Toy Cookey in range   Thought Process:  Linear and Descriptions of Associations: Intact  Orientation:  Other:  fully alert and attentive   Thought Content:  denies hallucinations at this time, no delusions are expressed -Does not appear internally preoccupied at this time  Suicidal Thoughts:  No denies suicidal or self injurious ideations, denies homicidal or violent ideations  Homicidal Thoughts:  No  Memory:  recent and remtoe grossly intact   Judgement:  improving  Insight:  Fair/improving  Psychomotor Activity:  Normal  Concentration:  Concentration: Good and Attention Span: Good  Recall:  Good  Fund of Knowledge:  Good  Language:  Good  Akathisia:  Negative  Handed:  Right  AIMS (if indicated):     Assets:  Communication Skills Desire for Improvement Resilience  ADL's:  Intact  Cognition:  WNL  Sleep:  Number of Hours: 3.75   Assessment -  33 year old male, known to [facility] from prior admissions, presented to hospital voluntarily reporting worsening depression, suicidal ideations of walking into traffic, neuro-vegetative symptoms, intermittent auditory hallucinations. Reports recent relapse on cocaine, cannabis, and alcohol, although states has been drinking only 1-2 beers per day. Also reports he has been off his medications for about two weeks.  Identifies homelessness as major stressor.  Patient reports some improvement compared to admission.  Acknowledges feeling better.  Affect presents full in range/brighter.  Denies hallucinations at this time and does not appear internally preoccupied.  Not suicidal.  Does continue to ruminate about homelessness and lack of cell phone (which he states affects his ability to maintain appointments/keep social contacts) as major stressors.  I have encouraged him to consider rehab as a disposition option.   Treatment Plan Summary: Daily contact with patient to assess and evaluate symptoms and progress in treatment, Medication management, Plan inpatient treatment and medications as below  Treatment plan reviewed as below today 7/11 Encourage group and milieu participation  Encourage  efforts to work on sobriety and relapse prevention Continue Celexa 10 mgrs QDAY for depression, anxiety Continue Risperidone 1 mgr QHS for mood disorder/psychosis Increase Neurontin to 300 mgrs TID for anxiety, pain Continue Trazodone 50 mgrs QHS PRN for insomnia as needed  Continue Biktarvy for HIV management  Treatment team working on disposition planning options   Felicita Gage A  Cobos, MD 04/08/2019, 9:20 AM   Patient ID: Danne Harbor, male   DOB: 1986/02/23, 33 y.o.   MRN: [id number]

## 2019-04-08 NOTE — [facility] Group Notes (Signed)
LCSW Group Therapy Note  04/08/2019    10:00-11:00am   Type of Therapy and Topic:  Group Therapy: Early Messages Received About Anger  Participation Level:  Active   Description of Group:   In this group, patients shared and discussed the early messages received in their lives about anger through parental or other adult modeling, teaching, repression, punishment, violence, and more.  Participants identified how those childhood lessons influence even now how they usually or often react when angered.  The group discussed that anger is a secondary emotion and what may be the underlying emotional themes that come out through anger outbursts or that are ignored through anger suppression.  Finally, as a group there was a conversation about the workbook's quote that "There is nothing wrong with anger; it is just a sign something needs to change."     Therapeutic Goals: 1. Patients will identify one or more childhood message about anger that they received and how it was taught to them. 2. Patients will discuss how these childhood experiences have influenced and continue to influence their own expression or repression of anger even today. 3. Patients will explore possible primary emotions that tend to fuel their secondary emotion of anger. 4. Patients will learn that anger itself is normal and cannot be eliminated, and that healthier coping skills can assist with resolving conflict rather than worsening situations.  Summary of Patient Progress:  The patient shared that his childhood lessons about anger were from seeing his mother and father fight verbally and physically.  As a result, he used to do the same thing, but has since learned to defuse conflict.  He became a people pleaser, as well.  The patient was hyper focused throughout group on his religious beliefs and how that helps him to stay well.  He showed little insight into healthy coping skills he could use at times that this is not working for  him.  Therapeutic Modalities:   Cognitive Behavioral Therapy Motivation Interviewing  [name]  .

## 2019-04-08 NOTE — Progress Notes (Signed)
D: Patient observed in dayroom all evening watching movie with peers. Patient states he is doing well and was particularly pleased that he's had some weight gain. Patient's affect animated, mood euthymic, pleasant. Denies pain, physical complaints. COVID-19 screen negative, afebrile. Respiratory assessment WDL.  A: Medicated per orders, prn trazadone given for sleep. Medication education provided. Level III obs in place for safety. Emotional support offered. Patient encouraged to complete Suicide Safety Plan before discharge. Encouraged to attend and participate in unit programming.    R: Patient verbalizes understanding of POC. On reassess, patient resting in bed. Patient denies SI/HI/AVH and remains safe on level III obs. Will continue to monitor throughout the night.

## 2019-04-08 NOTE — Progress Notes (Signed)
D pt is observed OOB UAL on the 300 hall today. HE wears hospital- issued patient scrubs.. He smiles broadly and says " good morning", loudly and flamboyantly to this writer this am.   He is seen often in the 300 hall dayroom laughing and socializng with his peers. He is less loud and impulsive today, remaining more still and quieter than yesterday.     R He completed his daily assessment and on this he wrote he denied having SI today and he rated his depression, hopelessness and anxiety " " 0/0/7", respectively. He requests an increase in his sleep med dosage and this is relayed to his physician by this Probation officer.     R Safety in place.

## 2019-04-08 NOTE — [facility] Group Notes (Signed)
[facility] Group Notes:  (Nursing)  Date:  04/08/2019  Time: 100 PM Type of Therapy:  Nurse Education  Participation Level:  Active  Participation Quality:  Appropriate  Affect:  Appropriate  Cognitive:  Appropriate  Insight:  Appropriate  Engagement in Group:  Engaged  Modes of Intervention:  Education  Summary of Progress/Problems: Life Skills Group  Edwin Martinez 04/08/2019, 3:07 PM

## 2019-04-09 NOTE — Plan of Care (Signed)
  Problem: Education: Goal: Mental status will improve Outcome: Not Progressing   

## 2019-04-09 NOTE — Progress Notes (Signed)
D: Patient observed up and visible in the milieu. Patient states he's had a good day. Patient's affect animated, mood anxious but pleasant. Denies pain, physical complaints. COVID-19 screen negative, afebrile. Respiratory assessment WDL.  A: Medicated per orders, prn trazadone given for sleep. Medication education provided. Level III obs in place for safety. Emotional support offered. Patient encouraged to complete Suicide Safety Plan before discharge. Encouraged to attend and participate in unit programming.    R: Patient verbalizes understanding of POC. On reassess, patient is resting in bed. Patient denies SI/HI/AVH and remains safe on level III obs. Will continue to monitor throughout the night.

## 2019-04-09 NOTE — Progress Notes (Signed)
D Patient is observed OOB UAL on the 300 hall today- he wears his own clothes. His hair is no longer unruly- it has been braided by another patient. His demeanor remains cooperative- yet he continues loud and hypomanic in his speech and his behaviors. He laughs loudly in front of this writer- cracks jokes and socializes throughout the day with his peers both in the dayroom and at the nurses' station and in the hallways. He calls this nurse by the first name- he interrupts Probation officer frequently when Probation officer is engaged in conversation with other patients. His conversation is convoluted and he frequently rambles.     A He completed his daily assessment and on this he wrote he denied having SI today and he rated his depression, hopelessness and anxiety " 0/0/7", respectively. Writer encouraged him to attend his groups today and to continue his CBT homework from his Life SKills group that was held 04/09/2019/     R Safety in place and Probation officer to continue to maintain therapeutic boundaries and maintain safe environment.

## 2019-04-09 NOTE — [facility] Group Notes (Signed)
[facility] LCSW Group Therapy Note  Date/Time:  04/09/2019  9:30AM-10:15PM  Type of Therapy and Topic:  Group Therapy:  Music and Relapse Prevention  Participation Level:  Active   Description of Group: In this process group, members discussed what types of music trigger them to drug or alcohol use and what they can do about this in the future.  For instance, we discussed what to do when riding in a friend's car and a song harmful to the patient starts playing.  We also discussed how music can be used as a tool to help with wellness and recovery in various ways including managing depression and anxiety as well as encouraging healthy sleep habits and avoiding relapse.  Three songs were played as examples, including "Breaking Down," "Wine Into Water," and "Brand New Me."  Comments were elicited which showed the group to be in agreement that the songs were quite relatable and have the potential to help them out of the hospital setting.  Therapeutic Goals: 1. Patients will be introduced to three specific songs that can relate to addictions in a helpful way. 2. Patients will explore the impact of different pieces of music on their feelings, i.e. uplifting, triggering, etc. 3. Patients will discuss how to use this self-knowledge to assist in preventing relapse.  Summary of Patient Progress:  At the beginning of group, patient expressed that he loves "all types of music."  While he appreciated the various songs played, he was more focused on two things he wanted to talk about.  First to the group, he wanted to explain that he became gay after his previous wife gave birth to their stillborn son and he did not want to ever go through that again.  Second, he wanted to explain to [place] privately how important it is for him to get a phone tomorrow before discharge.  It was once again explained to him that it would be very unlikely for our weekday staff to know of additional resources that could get him that phone quickly  and for free, but that writer has no resources related to this to offer.  Therapeutic Modalities: Solution Focused Brief Therapy Activity   Selmer Dominion, LCSW

## 2019-04-09 NOTE — Progress Notes (Signed)
[facility] MD Progress Note  04/09/2019 10:25 AM Edwin Martinez  MRN:  [id number] Subjective: Patient describes partial improvement compared to admission although he continues to ruminate about stressors, mainly related to homelessness and lack of resources.  Currently denies suicidal ideations and he is starting to focus more on discharge planning.  Denies medication side effects.  Objective : I have reviewed chart notes and have met with patient. 33 year old male, known to [facility] from prior admissions, presented to hospital voluntarily reporting worsening depression, suicidal ideations of walking into traffic, neuro-vegetative symptoms, intermittent auditory hallucinations. Reports recent relapse on cocaine, cannabis, and alcohol, although states has been drinking only 1-2 beers per day. Also reports he has been off his medications for about two weeks.  Identifies homelessness as major stressor.  Presents calm, pleasant on approach, describing improvement compared to how he felt on admission but ongoing ruminations and anxiety related to his stressors, mainly consisting of homelessness and limited support system.  He continues to focus on cell phone issues, states that "things would be a lot better for me if I had a cell phone".  He is denying suicidal ideations and is currently future oriented, inquiring regarding potential plans at discharge. Denies medication side effects. Denies suicidal ideations today.   Principal Problem: Schizoaffective disorder ([place]) Diagnosis: Principal Problem:   Schizoaffective disorder ([place]) Active Problems:   MDD (major depressive disorder), recurrent severe, without psychosis ([place])  Total Time spent with patient: 15 minutes  Past Psychiatric History:   Past Medical History:  Past Medical History:  Diagnosis Date  . ADD (attention deficit disorder)   . Anxiety   . Asthma   . Bipolar 1 disorder ([place])   . Depression   . HIV (human immunodeficiency virus  infection) ([place]) dx'd 2008  . Hypertension   . Insomnia   . Intentional drug overdose ([place])    Archie Endo 02/25/2018  . Polysubstance abuse ([place])    Archie Endo 02/25/2018  . Schizophrenia ([place])   . Seizures ([place])    "used to have little black-out szs where I'd drop out for 2-3 min then come back; nothing in the last 2-3-4years" (02/25/2018)  . Shingles     Past Surgical History:  Procedure Laterality Date  . DENTAL SURGERY     "had my eye teeth pulled down"   Family History:  Family History  Problem Relation Age of Onset  . Huntington's disease Father   . Heart disease Mother   . Suicidality Maternal Uncle   . Suicidality Maternal Grandmother    Family Psychiatric  History:  Social History:  Social History   Substance and Sexual Activity  Alcohol Use Yes  . Alcohol/week: 4.0 standard drinks  . Types: 4 Cans of beer per week   Comment: occasionally, "socially"     Social History   Substance and Sexual Activity  Drug Use Yes  . Types: Marijuana, Cocaine    Social History   Socioeconomic History  . Marital status: Single    Spouse name: Not on file  . Number of children: Not on file  . Years of education: Not on file  . Highest education level: Not on file  Occupational History  . Occupation: disability pending  Social Needs  . Financial resource strain: Not on file  . Food insecurity    Worry: Not on file    Inability: Not on file  . Transportation needs    Medical: Not on file    Non-medical: Not on file  Tobacco  Use  . Smoking status: Current Every Day Smoker    Packs/day: 0.50    Years: 20.00    Pack years: 10.00    Types: Cigarettes    Start date: 09/29/1991  . Smokeless tobacco: Never Used  Substance and Sexual Activity  . Alcohol use: Yes    Alcohol/week: 4.0 standard drinks    Types: 4 Cans of beer per week    Comment: occasionally, "socially"  . Drug use: Yes    Types: Marijuana, Cocaine  . Sexual activity: Yes    Birth control/protection: Condom   Lifestyle  . Physical activity    Days per week: Not on file    Minutes per session: Not on file  . Stress: Not on file  Relationships  . Social Herbalist on phone: Not on file    Gets together: Not on file    Attends religious service: Not on file    Active member of club or organization: Not on file    Attends meetings of clubs or organizations: Not on file    Relationship status: Not on file  Other Topics Concern  . Not on file  Social History Narrative   ** Merged History Encounter **       ** Merged History Encounter **       Additional Social History:   Sleep: Good  Appetite:  Good  Current Medications: Current Facility-Administered Medications  Medication Dose Route Frequency Provider Last Rate Last Dose  . acetaminophen (TYLENOL) tablet 650 mg  650 mg Oral Q6H PRN Suella Broad, FNP      . bictegravir-emtricitabine-tenofovir AF (BIKTARVY) 50-200-25 MG per tablet 1 tablet  1 tablet Oral Daily Cobos, Myer Peer, MD   1 tablet at 04/09/19 0850  . citalopram (CELEXA) tablet 10 mg  10 mg Oral Daily Cobos, Myer Peer, MD   10 mg at 04/09/19 0850  . gabapentin (NEURONTIN) capsule 300 mg  300 mg Oral TID Cobos, Myer Peer, MD   300 mg at 04/09/19 0850  . nicotine (NICODERM CQ - dosed in mg/24 hours) patch 21 mg  21 mg Transdermal Daily Johnn Hai, MD   21 mg at 04/09/19 0850  . risperiDONE (RISPERDAL) tablet 1 mg  1 mg Oral QHS Cobos, Myer Peer, MD   1 mg at 04/08/19 2115  . thiamine (B-1) injection 100 mg  100 mg Intramuscular Once Sheran Fava S, FNP      . thiamine (VITAMIN B-1) tablet 100 mg  100 mg Oral Daily Suella Broad, FNP   100 mg at 04/09/19 0850  . traZODone (DESYREL) tablet 100 mg  100 mg Oral QHS PRN Deloria Lair, NP        Lab Results: No results found for this or any previous visit (from the past 48 hour(s)).  Blood Alcohol level:  Lab Results  Component Value Date   ETH 31 (H) 04/02/2019   ETH 29 (H)  44/96/7591    Metabolic Disorder Labs: Lab Results  Component Value Date   HGBA1C 5.7 (H) 01/18/2019   MPG 116.89 01/18/2019   MPG 114 12/24/2016   Lab Results  Component Value Date   PROLACTIN 28.8 (H) 12/24/2016   PROLACTIN 32.3 (H) 08/19/2016   Lab Results  Component Value Date   CHOL 142 03/22/2019   TRIG 52 03/22/2019   HDL 65 03/22/2019   CHOLHDL 2.2 03/22/2019   VLDL 26 12/24/2016   LDLCALC 64 03/22/2019   LDLCALC 74 08/31/2018  Physical Findings: AIMS: Facial and Oral Movements Muscles of Facial Expression: None, normal Lips and Perioral Area: None, normal Jaw: None, normal Tongue: None, normal,Extremity Movements Upper (arms, wrists, hands, fingers): None, normal Lower (legs, knees, ankles, toes): None, normal, Trunk Movements Neck, shoulders, hips: None, normal, Overall Severity Severity of abnormal movements (highest score from questions above): None, normal Incapacitation due to abnormal movements: None, normal Patient's awareness of abnormal movements (rate only patient's report): No Awareness, Dental Status Current problems with teeth and/or dentures?: No Does patient usually wear dentures?: No  CIWA:  CIWA-Ar Total: 0 COWS:     Musculoskeletal: Strength & Muscle Tone: within normal limits no restlessness or psychomotor agitation Gait & Station: normal Patient leans: N/A  Psychiatric Specialty Exam: Physical Exam  ROS denies chest pain or shortness of breath, no cough, no vomiting   Blood pressure 125/83, pulse 71, temperature 98.6 F (37 C), resp. rate 18, height 5' 7"  (1.702 m), weight 64 kg, SpO2 100 %.Body mass index is 22.1 kg/m.  General Appearance: Improved grooming  Eye Contact:  Good  Speech:  Normal Rate  Volume:  Normal  Mood:  Gradually improving mood  Affect:  Appropriate, reactive, anxious when discussing stressors  Thought Process:  Linear and Descriptions of Associations: Intact  Orientation:  Other:  fully alert and  attentive  Thought Content:  denies hallucinations at this time, no delusions are expressed -Does not appear internally preoccupied at this time  Suicidal Thoughts:  No denies suicidal or self injurious ideations, denies homicidal or violent ideations  Homicidal Thoughts:  No  Memory:  recent and remtoe grossly intact   Judgement:  improving  Insight:  Fair/improving  Psychomotor Activity:  Normal  Concentration:  Concentration: Good and Attention Span: Good  Recall:  Good  Fund of Knowledge:  Good  Language:  Good  Akathisia:  Negative  Handed:  Right  AIMS (if indicated):     Assets:  Communication Skills Desire for Improvement Resilience  ADL's:  Intact  Cognition:  WNL  Sleep:  Number of Hours: 6.25   Assessment -  33 year old male, known to [facility] from prior admissions, presented to hospital voluntarily reporting worsening depression, suicidal ideations of walking into traffic, neuro-vegetative symptoms, intermittent auditory hallucinations. Reports recent relapse on cocaine, cannabis, and alcohol, although states has been drinking only 1-2 beers per day. Also reports he has been off his medications for about two weeks.  Identifies homelessness as major stressor.  Continues to report some anxiety/stress related to psychosocial social stressors.  Does endorse improving mood and presents with a reactive although slightly anxious affect.  No suicidal ideations/future oriented.  Tolerating medications well.  Not presenting with any withdrawal symptoms.  We discussed disposition options- ambivalent about referral to a rehab setting.  Treatment Plan Summary: Daily contact with patient to assess and evaluate symptoms and progress in treatment, Medication management, Plan inpatient treatment and medications as below  Treatment plan reviewed as below today 7/12 Encourage group and milieu participation  Encourage efforts to work on sobriety and relapse prevention Continue Celexa 10 mgrs QDAY  for depression, anxiety Continue Risperidone 1 mgr QHS for mood disorder/psychosis Continue Neurontin  300 mgrs TID for anxiety, pain Continue Trazodone 50 mgrs QHS PRN for insomnia as needed  Continue Biktarvy for HIV management  Treatment team working on disposition planning options   Jenne Campus, MD 04/09/2019, 10:25 AM   Patient ID: Danne [place], male   DOB: 16-Feb-1986, 33 y.o.  MRN: [id number]

## 2019-04-09 NOTE — Progress Notes (Signed)
[place] NOVEL CORONAVIRUS (COVID-19) DAILY CHECK-OFF SYMPTOMS - answer yes or no to each - every day NO YES  Have you had a fever in the past 24 hours?  . Fever (Temp > 37.80C / 100F) X   Have you had any of these symptoms in the past 24 hours? . New Cough .  Sore Throat  .  Shortness of Breath .  Difficulty Breathing .  Unexplained Body Aches   X   Have you had any one of these symptoms in the past 24 hours not related to allergies?   . Runny Nose .  Nasal Congestion .  Sneezing   X   If you have had runny nose, nasal congestion, sneezing in the past 24 hours, has it worsened?  X   EXPOSURES - check yes or no X   Have you traveled outside the state in the past 14 days?  X   Have you been in contact with someone with a confirmed diagnosis of COVID-19 or PUI in the past 14 days without wearing appropriate PPE?  X   Have you been living in the same home as a person with confirmed diagnosis of COVID-19 or a PUI (household contact)?    X   Have you been diagnosed with COVID-19?    X              What to do next: Answered NO to all: Answered YES to anything:   Proceed with unit schedule Follow the [facility] Inpatient Flowsheet.   

## 2019-04-10 ENCOUNTER — Ambulatory Visit: Payer: Self-pay | Admitting: Internal Medicine

## 2019-04-10 DIAGNOSIS — F1424 Cocaine dependence with cocaine-induced mood disorder: Secondary | ICD-10-CM

## 2019-04-10 MED ORDER — GABAPENTIN 300 MG PO CAPS: 300.0000 mg | ORAL_CAPSULE | Freq: Three times a day (TID) | ORAL | 0 refills | Status: DC

## 2019-04-10 MED ORDER — CITALOPRAM HYDROBROMIDE 10 MG PO TABS: 10.0000 mg | ORAL_TABLET | Freq: Every day | ORAL | 0 refills | Status: DC

## 2019-04-10 MED ORDER — BICTEGRAVIR-EMTRICITAB-TENOFOV 50-200-25 MG PO TABS: 1.0000 | ORAL_TABLET | Freq: Every day | ORAL | 0 refills | Status: DC

## 2019-04-10 MED ORDER — TRAZODONE HCL 100 MG PO TABS: 100.0000 mg | ORAL_TABLET | Freq: Every evening | ORAL | 0 refills | Status: DC | PRN

## 2019-04-10 MED ORDER — NICOTINE 21 MG/24HR TD PT24: 21.0000 mg | MEDICATED_PATCH | Freq: Every day | TRANSDERMAL | 0 refills | Status: AC

## 2019-04-10 MED ORDER — RISPERIDONE 1 MG PO TABS: 1.0000 mg | ORAL_TABLET | Freq: Every day | ORAL | 0 refills | Status: DC

## 2019-04-10 NOTE — Plan of Care (Signed)
  Problem: Health Behavior/Discharge Planning: Goal: Compliance with treatment plan for underlying cause of condition will improve Outcome: Progressing   Problem: Safety: Goal: Periods of time without injury will increase Outcome: Progressing   

## 2019-04-10 NOTE — Progress Notes (Signed)
D:  Patient's self inventory sheet, patient has fair sleep, sleep medication helpful.  Good appetite, high energy level, good concentration.  Denied depression and hopeless, rated anxiety #6.  Denied withdrawals.  Denied SI.  Denied physical problems.  Denied physical pain.  Goal is stay on meds, get a cell phone.  Plans to keep appointments, talk to MD.  Does have discharge plans. A:  Medications administered per MD orders.  Emotional support and encouragement given patient. R:  Denied SI and HI, contracts for safety.  Denied A/V hallucinations.  Safety maintained with 15 minute checks.

## 2019-04-10 NOTE — Progress Notes (Addendum)
Spiritual care group on grief and loss facilitated by chaplain Jerene Pitch  Group Goal:  Support / Education around grief and loss Members engage in facilitated group support and psycho-social education.  Group Description:  Following introductions and group rules, group members engaged in facilitated group dialog and support around topic of loss, with particular support around experiences of loss in their lives. Group Identified types of loss (relationships / self / things) and identified patterns, circumstances, and changes that precipitate losses. Reflected on thoughts / feelings around loss, normalized grief responses, and recognized variety in grief experience. Patient Progress: Edwin Martinez [name]) was present throughout group.  Engaged with group around death of mother.  Described her choosing treatment options.  Stated he felt some guilt early in his grief process around "not being there for her," but as he progressed, he realized she had chosen the care that was appropriate for her.  Spent some group time remembering mother and ways she chose to care for him and herself through life - including leaving a relationship.  [name] stated he has a lot of resect for his mother and this helps him feel comfort.  Also spoke of his faith tradition as a source of comfort.

## 2019-04-10 NOTE — Tx Team (Signed)
Interdisciplinary Treatment and Diagnostic Plan Update  04/10/2019 Time of Session: 9:00am Edwin Martinez Heuberger MRN: [id number]  Principal Diagnosis: Schizoaffective disorder [facility](HCC)  Secondary Diagnoses: Principal Problem:   Schizoaffective disorder (HCC) Active Problems:   MDD (major depressive disorder), recurrent severe, without psychosis (HCC)   Current Medications:  Current Facility-Administered Medications  Medication Dose Route Frequency Provider Last Rate Last Dose  . acetaminophen (TYLENOL) tablet 650 mg  650 mg Oral Q6H PRN Maryagnes AmosStarkes-Perry, Takia S, FNP      . bictegravir-emtricitabine-tenofovir AF (BIKTARVY) 50-200-25 MG per tablet 1 tablet  1 tablet Oral Daily Cobos, Rockey SituFernando A, MD   1 tablet at 04/10/19 0746  . citalopram (CELEXA) tablet 10 mg  10 mg Oral Daily Cobos, Rockey SituFernando A, MD   10 mg at 04/10/19 0746  . gabapentin (NEURONTIN) capsule 300 mg  300 mg Oral TID Cobos, Rockey SituFernando A, MD   300 mg at 04/10/19 0746  . nicotine (NICODERM CQ - dosed in mg/24 hours) patch 21 mg  21 mg Transdermal Daily Malvin JohnsFarah, Brian, MD   21 mg at 04/10/19 0747  . risperiDONE (RISPERDAL) tablet 1 mg  1 mg Oral QHS Cobos, Rockey SituFernando A, MD   1 mg at 04/09/19 2105  . thiamine (B-1) injection 100 mg  100 mg Intramuscular Once Caryn BeeStarkes-Perry, Takia S, FNP      . thiamine (VITAMIN B-1) tablet 100 mg  100 mg Oral Daily Maryagnes AmosStarkes-Perry, Takia S, FNP   100 mg at 04/10/19 0747  . traZODone (DESYREL) tablet 100 mg  100 mg Oral QHS PRN Jearld Leschixon, Rashaun M, NP   100 mg at 04/09/19 2106   PTA Medications: Medications Prior to Admission  Medication Sig Dispense Refill Last Dose  . bictegravir-emtricitabine-tenofovir AF (BIKTARVY) 50-200-25 MG TABS tablet Take 1 tablet by mouth daily. 30 tablet 5   . busPIRone (BUSPAR) 5 MG tablet Take 1 tablet (5 mg total) by mouth 2 (two) times daily. For anxiety 14 tablet 0   . citalopram (CELEXA) 10 MG tablet Take 1 tablet (10 mg total) by mouth daily. For mood 7 tablet 0   .  FLUoxetine (PROZAC) 20 MG capsule Take 1 capsule (20 mg total) by mouth daily. 7 capsule 0   . gabapentin (NEURONTIN) 100 MG capsule Take 1 capsule (100 mg total) by mouth 3 (three) times daily. For anxiety/agitation 21 capsule 0   . risperiDONE (RISPERDAL) 1 MG tablet Take 1 tablet (1 mg total) by mouth at bedtime. For hallucinations 7 tablet 0   . traZODone (DESYREL) 50 MG tablet Take 1 tablet (50 mg total) by mouth at bedtime as needed for sleep. 30 tablet 0     Patient Stressors: Health problems Substance abuse  Patient Strengths: Ability for insight Active sense of humor Average or above average intelligence  Treatment Modalities: Medication Management, Group therapy, Case management,  1 to 1 session with clinician, Psychoeducation, Recreational therapy.   Physician Treatment Plan for Primary Diagnosis: Schizoaffective disorder (HCC) Long Term Goal(s): Improvement in symptoms so as ready for discharge Improvement in symptoms so as ready for discharge   Short Term Goals: Ability to identify changes in lifestyle to reduce recurrence of condition will improve Ability to verbalize feelings will improve Ability to disclose and discuss suicidal ideas Ability to demonstrate self-control will improve Ability to identify and develop effective coping behaviors will improve Ability to maintain clinical measurements within normal limits will improve Ability to identify changes in lifestyle to reduce recurrence of condition will improve Ability to verbalize feelings will  improve Ability to disclose and discuss suicidal ideas Ability to demonstrate self-control will improve Ability to identify and develop effective coping behaviors will improve Ability to maintain clinical measurements within normal limits will improve  Medication Management: Evaluate patient's response, side effects, and tolerance of medication regimen.  Therapeutic Interventions: 1 to 1 sessions, Unit Group sessions and  Medication administration.  Evaluation of Outcomes: Adequate for Discharge  Physician Treatment Plan for Secondary Diagnosis: Principal Problem:   Schizoaffective disorder (HCC) Active Problems:   MDD (major depressive disorder), recurrent severe, without psychosis (HCC)  Long Term Goal(s): Improvement in symptoms so as ready for discharge Improvement in symptoms so as ready for discharge   Short Term Goals: Ability to identify changes in lifestyle to reduce recurrence of condition will improve Ability to verbalize feelings will improve Ability to disclose and discuss suicidal ideas Ability to demonstrate self-control will improve Ability to identify and develop effective coping behaviors will improve Ability to maintain clinical measurements within normal limits will improve Ability to identify changes in lifestyle to reduce recurrence of condition will improve Ability to verbalize feelings will improve Ability to disclose and discuss suicidal ideas Ability to demonstrate self-control will improve Ability to identify and develop effective coping behaviors will improve Ability to maintain clinical measurements within normal limits will improve     Medication Management: Evaluate patient's response, side effects, and tolerance of medication regimen.  Therapeutic Interventions: 1 to 1 sessions, Unit Group sessions and Medication administration.  Evaluation of Outcomes: Adequate for Discharge   RN Treatment Plan for Primary Diagnosis: Schizoaffective disorder (HCC) Long Term Goal(s): Knowledge of disease and therapeutic regimen to maintain health will improve  Short Term Goals: Ability to identify and develop effective coping behaviors will improve and Compliance with prescribed medications will improve  Medication Management: RN will administer medications as ordered by provider, will assess and evaluate patient's response and provide education to patient for prescribed medication.  RN will report any adverse and/or side effects to prescribing provider.  Therapeutic Interventions: 1 on 1 counseling sessions, Psychoeducation, Medication administration, Evaluate responses to treatment, Monitor vital signs and CBGs as ordered, Perform/monitor CIWA, COWS, AIMS and Fall Risk screenings as ordered, Perform wound care treatments as ordered.  Evaluation of Outcomes: Adequate for Discharge   LCSW Treatment Plan for Primary Diagnosis: Schizoaffective disorder [facility](HCC) Long Term Goal(s): Safe transition to appropriate next level of care at discharge, Engage patient in therapeutic group addressing interpersonal concerns.  Short Term Goals: Engage patient in aftercare planning with referrals and resources, Increase social support, Identify triggers associated with mental health/substance abuse issues and Increase skills for wellness and recovery  Therapeutic Interventions: Assess for all discharge needs, 1 to 1 time with Social worker, Explore available resources and support systems, Assess for adequacy in community support network, Educate family and significant other(s) on suicide prevention, Complete Psychosocial Assessment, Interpersonal group therapy.  Evaluation of Outcomes: Adequate for Discharge   Progress in Treatment: Attending groups: Yes. Participating in groups: Yes. Taking medication as prescribed: Yes. Toleration medication: Yes. Family/Significant other contact made: No, will contact:  reviewed SPE with patient. Patient understands diagnosis: Yes. Discussing patient identified problems/goals with staff: Yes. Medical problems stabilized or resolved: Yes. Denies suicidal/homicidal ideation: No. Issues/concerns per patient self-inventory: Yes.  New problem(s) identified: Yes, Describe:  homeless, no income or insurance, no supports.  New Short Term/Long Term Goal(s): detox, medication management for mood stabilization; elimination of SI thoughts; development of  comprehensive mental wellness/sobriety plan.  Patient Goals:  "I  want to get a cell phone."  Discharge Plan or Barriers: Following up with [facility]  Reason for Continuation of Hospitalization: Anxiety Depression  Estimated Length of Stay: discharge today  Attendees: Patient: Creedon Danielski 04/10/2019 11:09 AM  Physician: Larene Beach 04/10/2019 11:09 AM  Nursing: Rise Paganini, RN 04/10/2019 11:09 AM  RN Care Manager: 04/10/2019 11:09 AM  Social Worker: Stephanie Acre, [place] 04/10/2019 11:09 AM  Recreational Therapist:  04/10/2019 11:09 AM  Other:  04/10/2019 11:09 AM  Other:  04/10/2019 11:09 AM  Other: 04/10/2019 11:09 AM    Scribe for Treatment Team: Joellen Jersey, [place] 04/10/2019 11:09 AM

## 2019-04-10 NOTE — Progress Notes (Signed)
Recreation Therapy Notes  Date:  7.13.20 Time: 0930 Location: [address] Dayroom  Group Topic: Stress Management  Goal Area(s) Addresses:  Patient will identify positive stress management techniques. Patient will identify benefits of using stress management post d/c.  Intervention:  Stress Management  Activity :  Meditation.  LRT played a meditation that focused on making the most of your day.  Patients were to listen and follow along as the meditation played to engage in activity.  Education:  Stress Management, Discharge Planning.   Education Outcome: Acknowledges Education  Clinical Observations/Feedback:  Pt did not attend group.    Chellsie Gomer, LRT/CTRS         Demonta Wombles A 04/10/2019 11:21 AM 

## 2019-04-10 NOTE — Plan of Care (Signed)
Nurse discussed anxiety, depression, coping skills with patient. 

## 2019-04-10 NOTE — Progress Notes (Signed)
  [facility] Adult Case Management Discharge Plan :  Will you be returning to the same living situation after discharge:  Yes,  will follow up with the [facility] At discharge, do you have transportation home?: Yes,  bus is free Do you have the ability to pay for your medications: No. Referred to [facility] and [facility]  Release of information consent forms completed and in the chart. Patient to Follow up at: Follow-up Information    [name] Follow up on 04/11/2019.   Why: Hospital follow up appointment is Tuesday, 7/14 at 9:30a.  Please bring your photo ID, current medications and discharge paperwork from this hospitalization.  Contact information: [place] [id number] [phone]        [facility] for Infectious Disease Follow up on 04/17/2019.   Specialty: Infectious Diseases Why: Follow up appointment is Monday, 7/20 at 3:30p. Be sure to bring your photo ID and current medications.  Contact information: [place], [place] [id number] [place] [phone]          Next level of care provider has access to [place] and Suicide Prevention discussed: Yes,  with patient. Declines consents  Have you used any form of tobacco in the last 30 days? (Cigarettes, Smokeless Tobacco, Cigars, and/or Pipes): Yes  Has patient been referred to the Quitline?: Patient refused referral  Patient has been referred for addiction treatment: Yes  Joellen Jersey, [place] 04/10/2019, 11:11 AM

## 2019-04-10 NOTE — Progress Notes (Signed)
D: Pt denies SI/HI/AV hallucinations. Pt is pleasant and cooperative. Pt goal for today is to work on his discharge plans. A: Pt was offered support and encouragement. Pt was given scheduled medications. Pt was encourage to attend groups. Q 15 minute checks were done for safety.  R:Pt attends groups and interacts well with peers and staff. Pt is taking medication. Pt has no complaints.Pt receptive to treatment and safety maintained on unit.

## 2019-04-10 NOTE — Progress Notes (Signed)
Discharge Note:  Patient discharged home.  Denied SI and HI.  Denied A/V hallucinations.  Suicide prevention information given and discussed with patient who stated he understood and had no questions.  Patient stated he received all his belongings, toiletries, clothing, misc items.  Patient stated he appreciated all assistance received from [facility] staff.  All required discharge information given to patient at discharge.
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
## 2019-04-10 NOTE — [facility] Suicide Risk Assessment (Signed)
[facility] Discharge Suicide Risk Assessment   Principal Problem: Schizoaffective disorder [facility]) Discharge Diagnoses: Principal Problem:   Schizoaffective disorder ([place]) Active Problems:   MDD (major depressive disorder), recurrent severe, without psychosis ([place])   Total Time spent with patient: 30 minutes  Musculoskeletal: Strength & Muscle Tone: within normal limits Gait & Station: normal Patient leans: N/A  Psychiatric Specialty Exam: ROS denies chest pain or shortness of breath, no cough, no fever, no chills  Blood pressure 102/70, pulse 78, temperature 97.7 F (36.5 C), temperature source Oral, resp. rate 16, height 5\' 7"  (1.702 m), weight 64 kg, SpO2 100 %.Body mass index is 22.1 kg/m.  General Appearance: Well Groomed  Eye Contact::  Good  Speech:  Normal Rate409  Volume:  Normal  Mood:  Improving mood and currently euthymic  Affect:  Appropriate and Full Range  Thought Process:  Linear and Descriptions of Associations: Intact  Orientation:  Full (Time, Place, and Person)  Thought Content:  No hallucinations, no delusions  Suicidal Thoughts:  No denies suicidal or self-injurious ideations, denies homicidal or violent ideations  Homicidal Thoughts:  No  Memory:  Recent and remote grossly intact  Judgement:  Other:  Improving  Insight:  Improving  Psychomotor Activity:  Normal  Concentration:  Good  Recall:  Good  Fund of Knowledge:Good  Language: Good  Akathisia:  Negative  Handed:  Right  AIMS (if indicated):     Assets:  Communication Skills Desire for Improvement Resilience  Sleep:  Number of Hours: 5.25  Cognition: WNL  ADL's:  Intact   Mental Status Per Nursing Assessment::   On Admission:  Suicidal ideation indicated by patient  Demographic Factors:  33 year old male, currently homeless  Loss Factors: Homelessness, limited support, recently lost telephone  Historical Factors: History of prior psychiatric admissions, has been diagnosed with  schizoaffective disorder in the past, history of cocaine/cannabis abuse  Risk Reduction Factors:   Positive coping skills or problem solving skills  Continued Clinical Symptoms:  Today patient presents alert, attentive, oriented x3, pleasant, well related, mood improved and currently euthymic, full range of affect, no thought disorder, not suicidal, not homicidal, future oriented, no psychotic symptoms, denies medication side effects. Behavior on unit in good control, pleasant on approach.   Cognitive Features That Contribute To Risk:  No gross cognitive deficits noted upon discharge. Is alert , attentive, and oriented x 3     Suicide Risk:  Mild:  Suicidal ideation of limited frequency, intensity, duration, and specificity.  There are no identifiable plans, no associated intent, mild dysphoria and related symptoms, good self-control (both objective and subjective assessment), few other risk factors, and identifiable protective factors, including available and accessible social support.  Follow-up Information    [name] Follow up on 04/11/2019.   Why: Hospital follow up appointment is Tuesday, 7/14 at 9:30a.  Please bring your photo ID, current medications and discharge paperwork from this hospitalization.  Contact information: [place] [phone]        [facility] Follow up on 04/17/2019.   Specialty: Infectious Diseases Why: Follow up appointment is Monday, 7/20 at 3:30p. Be sure to bring your photo ID and current medications.  Contact information: [place], [place] [id number] [place] [id number]          Plan Of Care/Follow-up recommendations:  Activity:  As tolerated Diet:  Regular Tests:  NA Other:  See below  Patient is expressing readiness for  discharge, leaving unit in good spirits.  Plans to follow-up as above.  Craige CottaFernando A Bunyan Brier, MD 04/10/2019,  11:29 AM

## 2019-04-17 ENCOUNTER — Encounter: Payer: Self-pay | Admitting: Internal Medicine

## 2019-04-17 ENCOUNTER — Other Ambulatory Visit: Payer: Self-pay

## 2019-04-17 ENCOUNTER — Ambulatory Visit (INDEPENDENT_AMBULATORY_CARE_PROVIDER_SITE_OTHER): Payer: Self-pay | Admitting: Internal Medicine

## 2019-04-17 VITALS — BP 124/79 | HR 92 | Temp 98.2°F | Wt 151.1 lb

## 2019-04-17 DIAGNOSIS — B20 Human immunodeficiency virus [HIV] disease: Secondary | ICD-10-CM

## 2019-04-17 DIAGNOSIS — Z79899 Other long term (current) drug therapy: Secondary | ICD-10-CM

## 2019-04-17 DIAGNOSIS — F320 Major depressive disorder, single episode, mild: Secondary | ICD-10-CM

## 2019-04-17 MED ORDER — BICTEGRAVIR-EMTRICITAB-TENOFOV 50-200-25 MG PO TABS: 1.0000 | ORAL_TABLET | Freq: Every day | ORAL | 11 refills | Status: DC

## 2019-04-17 NOTE — Progress Notes (Signed)
RFV: follow up for hiv disease  Patient ID: Edwin Martinez, male   DOB: 24-Jun-1986, 33 y.o.   MRN: [id number]  HPI Edwin Martinez recently hospitalized at [facility] for suicide ideation. He has been off of his medications for the last week. He needs refill.he states that he is not feeling suicidal since admission. No other sick contacts or hospitalizations recentlly  Outpatient Encounter Medications as of 04/17/2019  Medication Sig  . citalopram (CELEXA) 10 MG tablet Take 1 tablet (10 mg total) by mouth daily.  Marland Kitchen. gabapentin (NEURONTIN) 300 MG capsule Take 1 capsule (300 mg total) by mouth 3 (three) times daily.  . nicotine (NICODERM CQ - DOSED IN MG/24 HOURS) 21 mg/24hr patch Place 1 patch (21 mg total) onto the skin daily.  . risperiDONE (RISPERDAL) 1 MG tablet Take 1 tablet (1 mg total) by mouth at bedtime.  . traZODone (DESYREL) 100 MG tablet Take 1 tablet (100 mg total) by mouth at bedtime as needed for sleep.  . bictegravir-emtricitabine-tenofovir AF (BIKTARVY) 50-200-25 MG TABS tablet Take 1 tablet by mouth daily. (Patient not taking: Reported on 04/17/2019)   No facility-administered encounter medications on file as of 04/17/2019.      Patient Active Problem List   Diagnosis Date Noted  . Cocaine dependence with cocaine-induced mood disorder (HCC)   . MDD (major depressive disorder), recurrent severe, without psychosis (HCC) 04/04/2019  . 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 Influenza A 10/11/2018  . MDD (major depressive disorder), severe (HCC) 10/06/2018  . Diarrhea due to protozoan   . Giardial enteritis   . Norovirus   . MDD (major depressive disorder) 02/26/2018  . Suicide attempt (HCC)   . Depression 11/08/2017  . Chest tightness   . Cough   . Leukocytosis   . SOB (shortness of breath)   . Nausea vomiting and diarrhea   . Asthma exacerbation 08/15/2017  . Elevated LFTs 08/12/2017  . Cocaine abuse with cocaine-induced mood disorder (HCC) 03/30/2017  . Herpes  zoster 03/06/2017  . Polysubstance abuse (HCC) 03/06/2017  . Homelessness 03/06/2017  . Schizoaffective disorder (HCC) 12/23/2016  . Suicidal ideation   . Cannabis use disorder, moderate, dependence (HCC) 07/27/2016  . Tobacco user 07/27/2016  . Intentional drug overdose (HCC) 07/18/2016  . Asthma 05/20/2007  . HIV (human immunodeficiency virus infection) (HCC) 05/05/2007    Social History   Tobacco Use  . Smoking status: Current Every Day Smoker    Packs/day: 0.50    Years: 20.00    Pack years: 10.00    Types: Cigarettes    Start date: 09/29/1991  . Smokeless tobacco: Never Used  Substance Use Topics  . Alcohol use: Yes    Alcohol/week: 4.0 standard drinks    Types: 4 Cans of beer per week    Comment: occasionally, "socially"  . Drug use: Yes    Types: Marijuana, Cocaine   There are no preventive care reminders to display for this patient.   Review of Systems Review of Systems  Constitutional: Negative for fever, chills, diaphoresis, activity change, appetite change, fatigue and unexpected weight change.  HENT: Negative for congestion, sore throat, rhinorrhea, sneezing, trouble swallowing and sinus pressure.  Eyes: Negative for photophobia and visual disturbance.  Respiratory: Negative for cough, chest tightness, shortness of breath, wheezing and stridor.  Cardiovascular: Negative for chest pain, palpitations and leg swelling.  Gastrointestinal: Negative for nausea, vomiting, abdominal pain, diarrhea, constipation, blood in stool, abdominal distention and anal bleeding.  Genitourinary: Negative for dysuria, hematuria, flank pain  and difficulty urinating.  Musculoskeletal: Negative for myalgias, back pain, joint swelling, arthralgias and gait problem.  Skin: Negative for color change, pallor, rash and wound.  Neurological: Negative for dizziness, tremors, weakness and light-headedness.  Hematological: Negative for adenopathy. Does not bruise/bleed easily.   Psychiatric/Behavioral: Negative for behavioral problems, confusion, sleep disturbance, dysphoric mood, decreased concentration and agitation.    Physical Exam   BP 124/79   Pulse 92   Temp 98.2 F (36.8 C) (Oral)   Wt 151 lb 1.9 oz (68.5 kg)   BMI 23.67 kg/m   Physical Exam  Constitutional: He is oriented to person, place, and time. He appears well-developed and well-nourished. No distress.  HENT:  Mouth/Throat: Oropharynx is clear and moist. No oropharyngeal exudate.  Cardiovascular: Normal rate, regular rhythm and normal heart sounds. Exam reveals no gallop and no friction rub.  No murmur heard.  Pulmonary/Chest: Effort normal and breath sounds normal. No respiratory distress. He has no wheezes.  Abdominal: Soft. Bowel sounds are normal. He exhibits no distension. There is no tenderness.  Lymphadenopathy:  He has no cervical adenopathy.  Neurological: He is alert and oriented to person, place, and time.  Skin: Skin is warm and dry. No rash noted. No erythema.  Psychiatric: He has a normal mood and affect. His behavior is normal.    Lab Results  Component Value Date   CD4TCELL 39 03/22/2019   Lab Results  Component Value Date   CD4TABS 1,154 03/22/2019   CD4TABS 1,250 08/31/2018   CD4TABS 1,560 04/06/2018   Lab Results  Component Value Date   HIV1RNAQUANT <20 NOT DETECTED 03/22/2019   Lab Results  Component Value Date   HEPBSAB REACTIVE (A) 09/06/2012   Lab Results  Component Value Date   LABRPR NON-REACTIVE 03/22/2019    CBC Lab Results  Component Value Date   WBC 9.4 04/02/2019   RBC 5.48 04/02/2019   HGB 15.9 04/02/2019   HCT 48.2 04/02/2019   PLT 262 04/02/2019   MCV 88.0 04/02/2019   MCH 29.0 04/02/2019   MCHC 33.0 04/02/2019   RDW 13.2 04/02/2019   LYMPHSABS 4.0 12/27/2018   MONOABS 0.5 12/27/2018   EOSABS 0.2 12/27/2018    BMET Lab Results  Component Value Date   NA 141 04/02/2019   K 3.8 04/02/2019   CL 112 (H) 04/02/2019   CO2 17  (L) 04/02/2019   GLUCOSE 91 04/02/2019   BUN 9 04/02/2019   CREATININE 0.92 04/02/2019   CALCIUM 9.0 04/02/2019   GFRNONAA >60 04/02/2019   GFRAA >60 04/02/2019      Assessment and Plan  hiv disease = we will just needs refills on biktarvy  Long term medicaiton management = cr is stable  Depression = continue on his current regimen  Will see back in 4-6 wk

## 2019-04-26 ENCOUNTER — Encounter: Payer: Self-pay | Admitting: Internal Medicine

## 2019-05-06 ENCOUNTER — Other Ambulatory Visit: Payer: Self-pay | Admitting: Internal Medicine

## 2019-05-24 ENCOUNTER — Other Ambulatory Visit: Payer: Self-pay | Admitting: Internal Medicine

## 2019-05-29 ENCOUNTER — Ambulatory Visit: Payer: Self-pay | Admitting: Internal Medicine

## 2019-06-07 ENCOUNTER — Telehealth: Payer: Self-pay

## 2019-06-07 NOTE — Telephone Encounter (Signed)
[name], Case Worker with [facility] called office today requesting appointment for patient. Patient told Karn Pickler that he has a rash on his arms, and would like to be seen as soon as possible to be evaluated. Patient is scheduled for tomorrow at 3.  [place]

## 2019-06-08 ENCOUNTER — Ambulatory Visit (INDEPENDENT_AMBULATORY_CARE_PROVIDER_SITE_OTHER): Payer: Self-pay | Admitting: Internal Medicine

## 2019-06-08 ENCOUNTER — Encounter: Payer: Self-pay | Admitting: Internal Medicine

## 2019-06-08 ENCOUNTER — Other Ambulatory Visit: Payer: Self-pay

## 2019-06-08 VITALS — BP 139/65 | HR 94 | Temp 98.9°F

## 2019-06-08 DIAGNOSIS — B20 Human immunodeficiency virus [HIV] disease: Secondary | ICD-10-CM

## 2019-06-08 MED ORDER — GABAPENTIN 300 MG PO CAPS: 300.0000 mg | ORAL_CAPSULE | Freq: Three times a day (TID) | ORAL | 0 refills | Status: DC

## 2019-06-08 MED ORDER — VALACYCLOVIR HCL 1 G PO TABS: 1000.0000 mg | ORAL_TABLET | Freq: Three times a day (TID) | ORAL | 0 refills | Status: AC

## 2019-06-08 MED ORDER — RISPERIDONE 1 MG PO TABS: 1.0000 mg | ORAL_TABLET | Freq: Every day | ORAL | 5 refills | Status: DC

## 2019-06-08 MED ORDER — BUSPIRONE HCL 5 MG PO TABS: 5.0000 mg | ORAL_TABLET | Freq: Every day | ORAL | 0 refills | Status: DC

## 2019-06-08 MED ORDER — TRAZODONE HCL 100 MG PO TABS: 100.0000 mg | ORAL_TABLET | Freq: Every evening | ORAL | 0 refills | Status: DC | PRN

## 2019-06-08 MED ORDER — DOXYCYCLINE HYCLATE 100 MG PO TABS: 100.0000 mg | ORAL_TABLET | Freq: Two times a day (BID) | ORAL | 0 refills | Status: DC

## 2019-06-08 MED ORDER — CITALOPRAM HYDROBROMIDE 10 MG PO TABS: 10.0000 mg | ORAL_TABLET | Freq: Every day | ORAL | 5 refills | Status: DC
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
## 2019-06-09 LAB — T-HELPER CELL (CD4) - (RCID CLINIC ONLY)
CD4 % Helper T Cell: 38 % (ref 33–65)
CD4 T Cell Abs: 1681 /uL (ref 400–1790)

## 2019-06-14 LAB — CBC WITH DIFFERENTIAL/PLATELET
Absolute Monocytes: 689 cells/uL (ref 200–950)
Basophils Absolute: 79 cells/uL (ref 0–200)
Basophils Relative: 0.7 %
Eosinophils Absolute: 746 cells/uL — ABNORMAL HIGH (ref 15–500)
Eosinophils Relative: 6.6 %
HCT: 43.4 % (ref 38.5–50.0)
Hemoglobin: 14.8 g/dL (ref 13.2–17.1)
Lymphs Abs: 4509 cells/uL — ABNORMAL HIGH (ref 850–3900)
MCH: 29.4 pg (ref 27.0–33.0)
MCHC: 34.1 g/dL (ref 32.0–36.0)
MCV: 86.3 fL (ref 80.0–100.0)
MPV: 11.1 fL (ref 7.5–12.5)
Monocytes Relative: 6.1 %
Neutro Abs: 5277 cells/uL (ref 1500–7800)
Neutrophils Relative %: 46.7 %
Platelets: 350 10*3/uL (ref 140–400)
RBC: 5.03 10*6/uL (ref 4.20–5.80)
RDW: 13.1 % (ref 11.0–15.0)
Total Lymphocyte: 39.9 %
WBC: 11.3 10*3/uL — ABNORMAL HIGH (ref 3.8–10.8)

## 2019-06-14 LAB — COMPLETE METABOLIC PANEL WITH GFR
AG Ratio: 1.4 (calc) (ref 1.0–2.5)
ALT: 60 U/L — ABNORMAL HIGH (ref 9–46)
AST: 33 U/L (ref 10–40)
Albumin: 3.9 g/dL (ref 3.6–5.1)
Alkaline phosphatase (APISO): 80 U/L (ref 36–130)
BUN: 7 mg/dL (ref 7–25)
CO2: 21 mmol/L (ref 20–32)
Calcium: 9.1 mg/dL (ref 8.6–10.3)
Chloride: 108 mmol/L (ref 98–110)
Creat: 0.81 mg/dL (ref 0.60–1.35)
GFR, Est African American: 135 mL/min/{1.73_m2} (ref >=60)
GFR, Est Non African American: 117 mL/min/{1.73_m2} (ref >=60)
Globulin: 2.8 g/dL (calc) (ref 1.9–3.7)
Glucose, Bld: 125 mg/dL — ABNORMAL HIGH (ref 65–99)
Potassium: 3.7 mmol/L (ref 3.5–5.3)
Sodium: 141 mmol/L (ref 135–146)
Total Bilirubin: 0.3 mg/dL (ref 0.2–1.2)
Total Protein: 6.7 g/dL (ref 6.1–8.1)

## 2019-06-14 LAB — HIV-1 RNA QUANT-NO REFLEX-BLD
HIV 1 RNA Quant: <20 copies/mL
HIV-1 RNA Quant, Log: <1.3 Log copies/mL

## 2019-06-14 LAB — RPR: RPR Ser Ql: NONREACTIVE

## 2019-06-23 ENCOUNTER — Emergency Department (HOSPITAL_COMMUNITY): Payer: Self-pay

## 2019-06-23 ENCOUNTER — Encounter (HOSPITAL_COMMUNITY): Payer: Self-pay | Admitting: Emergency Medicine

## 2019-06-23 ENCOUNTER — Emergency Department (HOSPITAL_COMMUNITY)
Admission: EM | Admit: 2019-06-23 | Discharge: 2019-06-23 | Disposition: A | Discharge status: Home / Self Care | Payer: Self-pay | Attending: Emergency Medicine | Admitting: Emergency Medicine

## 2019-06-23 ENCOUNTER — Other Ambulatory Visit: Payer: Self-pay

## 2019-06-23 DIAGNOSIS — F17210 Nicotine dependence, cigarettes, uncomplicated: Secondary | ICD-10-CM | POA: Insufficient documentation

## 2019-06-23 DIAGNOSIS — Z79899 Other long term (current) drug therapy: Secondary | ICD-10-CM | POA: Insufficient documentation

## 2019-06-23 DIAGNOSIS — Z590 Homelessness: Secondary | ICD-10-CM | POA: Diagnosis not present

## 2019-06-23 DIAGNOSIS — Z21 Asymptomatic human immunodeficiency virus [HIV] infection status: Secondary | ICD-10-CM | POA: Insufficient documentation

## 2019-06-23 DIAGNOSIS — J45909 Unspecified asthma, uncomplicated: Secondary | ICD-10-CM | POA: Insufficient documentation

## 2019-06-23 DIAGNOSIS — Y999 Unspecified external cause status: Secondary | ICD-10-CM | POA: Insufficient documentation

## 2019-06-23 DIAGNOSIS — R569 Unspecified convulsions: Secondary | ICD-10-CM | POA: Insufficient documentation

## 2019-06-23 DIAGNOSIS — S46911A Strain of unspecified muscle, fascia and tendon at shoulder and upper arm level, right arm, initial encounter: Secondary | ICD-10-CM | POA: Insufficient documentation

## 2019-06-23 DIAGNOSIS — S4981XA Other specified injuries of right shoulder and upper arm, initial encounter: Secondary | ICD-10-CM | POA: Diagnosis present

## 2019-06-23 DIAGNOSIS — Y92410 Unspecified street and highway as the place of occurrence of the external cause: Secondary | ICD-10-CM | POA: Diagnosis not present

## 2019-06-23 DIAGNOSIS — Y9301 Activity, walking, marching and hiking: Secondary | ICD-10-CM | POA: Diagnosis not present
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
## 2019-06-23 NOTE — Discharge Instructions (Signed)
Return to ED if you start to develop worsening symptoms, additional injuries or falls, numbness in arms or legs, blurry vision.

## 2019-06-23 NOTE — ED Notes (Signed)
Pt took c-collar off. Pt refuses to have it placed back on. Pt continuous to be extremely fidgety in bed.

## 2019-07-06 ENCOUNTER — Ambulatory Visit: Payer: Self-pay | Admitting: Internal Medicine

## 2019-07-12 ENCOUNTER — Other Ambulatory Visit: Payer: Self-pay

## 2019-07-12 ENCOUNTER — Encounter: Payer: Self-pay | Admitting: Pharmacy Technician

## 2019-07-12 ENCOUNTER — Encounter: Payer: Self-pay | Admitting: Internal Medicine

## 2019-07-12 ENCOUNTER — Telehealth: Payer: Self-pay | Admitting: Pharmacy Technician

## 2019-07-12 ENCOUNTER — Ambulatory Visit (INDEPENDENT_AMBULATORY_CARE_PROVIDER_SITE_OTHER): Payer: Self-pay | Admitting: Internal Medicine

## 2019-07-12 VITALS — BP 133/76 | HR 82 | Temp 98.4°F | Wt 144.0 lb

## 2019-07-12 DIAGNOSIS — B182 Chronic viral hepatitis C: Secondary | ICD-10-CM

## 2019-07-12 DIAGNOSIS — Z8619 Personal history of other infectious and parasitic diseases: Secondary | ICD-10-CM

## 2019-07-12 DIAGNOSIS — B20 Human immunodeficiency virus [HIV] disease: Secondary | ICD-10-CM

## 2019-07-12 DIAGNOSIS — Z79899 Other long term (current) drug therapy: Secondary | ICD-10-CM

## 2019-07-12 MED ORDER — LEDIPASVIR-SOFOSBUVIR 90-400 MG PO TABS: 1.0000 | ORAL_TABLET | Freq: Every day | ORAL | 2 refills | Status: DC

## 2019-07-12 NOTE — Progress Notes (Signed)
RFV: follow up for hiv disease  Patient ID: Edwin Martinez, male   DOB: 04-10-1986, 33 y.o.   MRN: [id number]  HPI 33yo M with HIV disease, CD 4 count of 1600/VL<20 in sep 2020,bipolar disorder, has Hep C without hepatic coma, untreated. Doing well. He reports that the shingles outbreak has improved. No longer any evidence of rash nor pain. He found relief with neurontin.  Has been taking biktarvy daily in addn to mental health meds  Outpatient Encounter Medications as of 07/12/2019  Medication Sig  . bictegravir-emtricitabine-tenofovir AF (BIKTARVY) 50-200-25 MG TABS tablet Take 1 tablet by mouth daily.  . busPIRone (BUSPAR) 5 MG tablet Take 1 tablet (5 mg total) by mouth daily.  . citalopram (CELEXA) 10 MG tablet Take 1 tablet (10 mg total) by mouth daily.  Marland Kitchen doxycycline (VIBRA-TABS) 100 MG tablet Take 1 tablet (100 mg total) by mouth 2 (two) times daily.  Marland Kitchen gabapentin (NEURONTIN) 300 MG capsule Take 1 capsule (300 mg total) by mouth 3 (three) times daily.  . nicotine (NICODERM CQ - DOSED IN MG/24 HOURS) 21 mg/24hr patch Place 1 patch (21 mg total) onto the skin daily.  . risperiDONE (RISPERDAL) 1 MG tablet Take 1 tablet (1 mg total) by mouth at bedtime.  . traZODone (DESYREL) 100 MG tablet Take 1 tablet (100 mg total) by mouth at bedtime as needed for sleep.   No facility-administered encounter medications on file as of 07/12/2019.      Patient Active Problem List   Diagnosis Date Noted  . Cocaine dependence with cocaine-induced mood disorder ([place])   . MDD (major depressive disorder), recurrent severe, without psychosis ([place]) 04/04/2019  . Cocaine-induced mood disorder ([place]) 01/17/2019  . Influenza A 10/11/2018  . MDD (major depressive disorder), severe ([place]) 10/06/2018  . Diarrhea due to protozoan   . Giardial enteritis   . Norovirus   . MDD (major depressive disorder) 02/26/2018  . Suicide attempt ([place])   . Depression 11/08/2017  . Chest tightness   . Cough   .  Leukocytosis   . SOB (shortness of breath)   . Nausea vomiting and diarrhea   . Asthma exacerbation 08/15/2017  . Elevated LFTs 08/12/2017  . Cocaine abuse with cocaine-induced mood disorder ([place]) 03/30/2017  . Herpes zoster 03/06/2017  . Polysubstance abuse ([place]) 03/06/2017  . Homelessness 03/06/2017  . Schizoaffective disorder ([place]) 12/23/2016  . Suicidal ideation   . Cannabis use disorder, moderate, dependence ([place]) 07/27/2016  . Tobacco user 07/27/2016  . Intentional drug overdose ([place]) 07/18/2016  . Asthma 05/20/2007  . HIV (human immunodeficiency virus infection) ([place]) 05/05/2007     Health Maintenance Due  Topic Date Due  . INFLUENZA VACCINE  04/29/2019   sochx: 1 drink on wkd per week. No illicit drugs  Review of Systems Review of Systems  Constitutional: Negative for fever, chills, diaphoresis, activity change, appetite change, fatigue and unexpected weight change.  HENT: Negative for congestion, sore throat, rhinorrhea, sneezing, trouble swallowing and sinus pressure.  Eyes: Negative for photophobia and visual disturbance.  Respiratory: Negative for cough, chest tightness, shortness of breath, wheezing and stridor.  Cardiovascular: Negative for chest pain, palpitations and leg swelling.  Gastrointestinal: Negative for nausea, vomiting, abdominal pain, diarrhea, constipation, blood in stool, abdominal distention and anal bleeding.  Genitourinary: Negative for dysuria, hematuria, flank pain and difficulty urinating.  Musculoskeletal: Negative for myalgias, back pain, joint swelling, arthralgias and gait problem.  Skin: Negative for color change, pallor, rash and wound.  Neurological: Negative for dizziness,  tremors, weakness and light-headedness.  Hematological: Negative for adenopathy. Does not bruise/bleed easily.  Psychiatric/Behavioral: Negative for behavioral problems, confusion, sleep disturbance, dysphoric mood, decreased concentration and agitation.    Physical  Exam   BP 133/76   Pulse 82   Temp 98.4 F (36.9 C)   Wt 144 lb (65.3 kg)   BMI 22.55 kg/m   Physical Exam  Constitutional: He is oriented to person, place, and time. He appears well-developed and well-nourished. No distress.  HENT:  Mouth/Throat: Oropharynx is clear and moist. No oropharyngeal exudate.  Cardiovascular: Normal rate, regular rhythm and normal heart sounds. Exam reveals no gallop and no friction rub.  No murmur heard.  Pulmonary/Chest: Effort normal and breath sounds normal. No respiratory distress. He has no wheezes.  Abdominal: Soft. Bowel sounds are normal. He exhibits no distension. There is no tenderness.  Lymphadenopathy:  He has no cervical adenopathy.  Neurological: He is alert and oriented to person, place, and time.  Skin: Skin is warm and dry. No rash noted. No erythema.  Psychiatric: He has a normal mood and affect. His behavior is normal.    Lab Results  Component Value Date   CD4TCELL 38 06/08/2019   Lab Results  Component Value Date   CD4TABS 1,681 06/08/2019   CD4TABS 1,154 03/22/2019   CD4TABS 1,250 08/31/2018   Lab Results  Component Value Date   HIV1RNAQUANT <20 NOT DETECTED 06/08/2019   Lab Results  Component Value Date   HEPBSAB REACTIVE (A) 09/06/2012   Lab Results  Component Value Date   LABRPR NON-REACTIVE 06/08/2019    CBC Lab Results  Component Value Date   WBC 11.3 (H) 06/08/2019   RBC 5.03 06/08/2019   HGB 14.8 06/08/2019   HCT 43.4 06/08/2019   PLT 350 06/08/2019   MCV 86.3 06/08/2019   MCH 29.4 06/08/2019   MCHC 34.1 06/08/2019   RDW 13.1 06/08/2019   LYMPHSABS 4,509 (H) 06/08/2019   MONOABS 0.5 12/27/2018   EOSABS 746 (H) 06/08/2019    BMET Lab Results  Component Value Date   NA 141 06/08/2019   K 3.7 06/08/2019   CL 108 06/08/2019   CO2 21 06/08/2019   GLUCOSE 125 (H) 06/08/2019   BUN 7 06/08/2019   CREATININE 0.81 06/08/2019   CALCIUM 9.1 06/08/2019   GFRNONAA 117 06/08/2019   GFRAA 135  06/08/2019      Assessment and Plan   hiv disease= well controlled, and has excellent adherence. Plan to continue on biktarvy  Hepatitis c without coma = will need to treat- he has chosen harvoni x 3 months. Will discuss with patient adherence with pharmacist. He will see cassie in 1 month for adherence, then I will see at completion of course of treatment.  Drinks one alcohol drink per week, no tylenol use. Recommended to avoid for the next 3 months  Health maintenance = already received  flu vaccine  Shingles = has recovered. Still on neurontin for neuropathy

## 2019-07-12 NOTE — Telephone Encounter (Signed)
[facility] PATIENT [facility]  Harvoni has been sent to [facility] on [address].  Mr. Keelin plans to pick up and start today.  Venida Jarvis. Nadara Mustard [place] [facility] Phone: [phone] Fax:  [phone]

## 2019-07-12 NOTE — Progress Notes (Signed)
HPI: Edwin Martinez is a 33 y.o. male who presents to the [facility] for HIV and Hepatitis C follow-up.  Patient Active Problem List   Diagnosis Date Noted  . Cocaine dependence with cocaine-induced mood disorder (HCC)   . MDD (major depressive disorder), recurrent severe, without psychosis (HCC) 04/04/2019  . 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 Influenza A 10/11/2018  . MDD (major depressive disorder), severe (HCC) 10/06/2018  . Diarrhea due to protozoan   . Giardial enteritis   . Norovirus   . MDD (major depressive disorder) 02/26/2018  . Suicide attempt (HCC)   . Depression 11/08/2017  . Chest tightness   . Cough   . Leukocytosis   . SOB (shortness of breath)   . Nausea vomiting and diarrhea   . Asthma exacerbation 08/15/2017  . Elevated LFTs 08/12/2017  . Cocaine abuse with cocaine-induced mood disorder (HCC) 03/30/2017  . Herpes zoster 03/06/2017  . Polysubstance abuse (HCC) 03/06/2017  . Homelessness 03/06/2017  . Schizoaffective disorder (HCC) 12/23/2016  . Suicidal ideation   . Cannabis use disorder, moderate, dependence (HCC) 07/27/2016  . Tobacco user 07/27/2016  . Intentional drug overdose (HCC) 07/18/2016  . Asthma 05/20/2007  . HIV (human immunodeficiency virus infection) (HCC) 05/05/2007    Patient's Medications  New Prescriptions   LEDIPASVIR-SOFOSBUVIR (HARVONI) 90-400 MG TABS    Take 1 tablet by mouth daily.  Previous Medications   BICTEGRAVIR-EMTRICITABINE-TENOFOVIR AF (BIKTARVY) 50-200-25 MG TABS TABLET    Take 1 tablet by mouth daily.   BUSPIRONE (BUSPAR) 5 MG TABLET    Take 1 tablet (5 mg total) by mouth daily.   CITALOPRAM (CELEXA) 10 MG TABLET    Take 1 tablet (10 mg total) by mouth daily.   DOXYCYCLINE (VIBRA-TABS) 100 MG TABLET    Take 1 tablet (100 mg total) by mouth 2 (two) times daily.   GABAPENTIN (NEURONTIN) 300 MG CAPSULE    Take 1 capsule (300 mg total) by mouth 3 (three) times daily.   NICOTINE (NICODERM CQ - DOSED  IN MG/24 HOURS) 21 MG/24HR PATCH    Place 1 patch (21 mg total) onto the skin daily.   RISPERIDONE (RISPERDAL) 1 MG TABLET    Take 1 tablet (1 mg total) by mouth at bedtime.   TRAZODONE (DESYREL) 100 MG TABLET    Take 1 tablet (100 mg total) by mouth at bedtime as needed for sleep.  Modified Medications   No medications on file  Discontinued Medications   No medications on file    Allergies: Allergies  Allergen Reactions  . Magnesium-Containing Compounds Other (See Comments)    This medication is contraindicated with pts HIV meds.    . Peanut-Containing Drug Products Anaphylaxis  . Esomeprazole Magnesium Cough  . Atripla [Efavirenz-Emtricitab-Tenofovir] Other (See Comments)    Reaction:  Suicidal thoughts   . Bactrim [Sulfamethoxazole-Trimethoprim]   . Bactrim [Sulfamethoxazole-Trimethoprim] Rash  . Penicillins Rash and Other (See Comments)    Has patient had a PCN reaction causing immediate rash, facial/tongue/throat swelling, SOB or lightheadedness with hypotension: Yes Has patient had a PCN reaction causing severe rash involving mucus membranes or skin necrosis: No Has patient had a PCN reaction that required hospitalization No Has patient had a PCN reaction occurring within the last 10 years: No If all of the above answers are "NO", then may proceed with Cephalosporin use.    Past Medical History: Past Medical History:  Diagnosis Date  . ADD (attention deficit disorder)   . Anxiety   .  Asthma   . Bipolar 1 disorder ([place])   . Depression   . HIV (human immunodeficiency virus infection) ([place]) dx'd 2008  . Hypertension   . Insomnia   . Intentional drug overdose ([place])    Archie Endo 02/25/2018  . Polysubstance abuse ([place])    Archie Endo 02/25/2018  . Schizophrenia ([place])   . Seizures ([place])    "used to have little black-out szs where I'd drop out for 2-3 min then come back; nothing in the last 2-3-4years" (02/25/2018)  . Shingles     Social History: Social History   Socioeconomic  History  . Marital status: Single    Spouse name: Not on file  . Number of children: Not on file  . Years of education: Not on file  . Highest education level: Not on file  Occupational History  . Occupation: disability pending  Social Needs  . Financial resource strain: Not on file  . Food insecurity    Worry: Not on file    Inability: Not on file  . Transportation needs    Medical: Not on file    Non-medical: Not on file  Tobacco Use  . Smoking status: Current Every Day Smoker    Packs/day: 0.50    Years: 20.00    Pack years: 10.00    Types: Cigarettes    Start date: 09/29/1991  . Smokeless tobacco: Never Used  Substance and Sexual Activity  . Alcohol use: Yes    Alcohol/week: 4.0 standard drinks    Types: 4 Cans of beer per week    Comment: occasionally, "socially"  . Drug use: Yes    Types: Marijuana, Cocaine  . Sexual activity: Yes    Birth control/protection: Condom  Lifestyle  . Physical activity    Days per week: Not on file    Minutes per session: Not on file  . Stress: Not on file  Relationships  . Social Herbalist on phone: Not on file    Gets together: Not on file    Attends religious service: Not on file    Active member of club or organization: Not on file    Attends meetings of clubs or organizations: Not on file    Relationship status: Not on file  Other Topics Concern  . Not on file  Social History Narrative   ** Merged History Encounter **       ** Merged History Encounter **        Labs: Hepatitis C Lab Results  Component Value Date   HCVGENOTYPE 1a 03/22/2019   HCVRNAPCRQN 771,000 (H) 03/22/2019   FIBROSTAGE F0 03/22/2019   Hepatitis B Lab Results  Component Value Date   HEPBSAB REACTIVE (A) 09/06/2012   HEPBSAG Negative 03/02/2018   HEPBCAB NEG 09/06/2012   Hepatitis A Lab Results  Component Value Date   HAV NEG 09/06/2012   HIV  Lab Results  Component Value Date   CREATININE 0.81 06/08/2019   CREATININE  0.92 04/02/2019   CREATININE 0.80 03/22/2019   CREATININE 0.78 01/18/2019   CREATININE 0.85 12/27/2018   Lab Results  Component Value Date   AST 33 06/08/2019   AST 67 (H) 04/02/2019   AST 50 (H) 03/22/2019   ALT 60 (H) 06/08/2019   ALT 90 (H) 04/02/2019   ALT 83 (H) 03/22/2019   ALT 83 (H) 03/22/2019   INR 1.0 11/08/2017   INR 1.06 08/16/2017   INR 1.02 12/23/2016    Assessment: Edwin Martinez is here today to  follow-up with [name] for his HIV and Hepatitis C co-infection.  He was recently restarted on Biktarvy in June after a long absence from our clinic. He has multiple other co-morbidities including cocaine abuse, alcoholism, schizoaffective disorder, and depression. He is more stable now and back on his psych medications. He is ready to begin treatment for Hepatitis C.  He is HMAP approved and can either take Harvoni x 12 weeks or Mavyret x 8 weeks.  I discussed the medications with him today and explained the difference and he wishes to proceed with Harvoni.  Counseled him to take Harvoni daily with or without food. Encouraged him not to miss any doses and explained how his chance of cure could go down with each dose missed.  Counseled on what to do if dose is missed - if it is closer to the missed dose take immediately; if closer to next dose then skip dose and take the next dose at the usual time.   Counseled on common side effects such as headache, fatigue, and nausea and that these normally decrease with time. I reviewed his medications and found no interactions. Discussed with him that there are several drug interactions including acid suppressants. Instructed him to call clinic if he wishes to start a new medication during course of therapy. Also advised him to call if he experiences side effects.   He will follow-up with me in the pharmacy clinic in one month for labs and adherence check.  He will see [name] after that when he has completed treatment for Hep C. His Harvoni will  be covered by HMAP.   Plan: - Continue Biktarvy PO once daily - Start Harvoni x 12 weeks - one pill once daily - F/u with me 11/16 at 230 - F/u with [name] 1/14 at 330pm  Ahren Pettinger L. Lissandra Keil, PharmD, BCIDP, AAHIVP, CPP Infectious Diseases Clinical Pharmacist [facility] 07/12/2019, 2:40 PM

## 2019-07-21 ENCOUNTER — Other Ambulatory Visit: Payer: Self-pay | Admitting: Internal Medicine

## 2019-07-31 NOTE — Telephone Encounter (Signed)
[facility] Patient Advocate Encounter  Received a fax this morning from [facility] requesting information about the patient's HepC treatment in length of treatment, viral load, medical history and genotype. The fax indicates that the initial shipment of Harvoni is set to arrive on 07/26/2019 to the patient's home. I tried to call the patient to confirm this shipment date and start date of medication but had to leave a voicemail.   [facility] attn Anda Kraft [phone] (fax)

## 2019-08-14 ENCOUNTER — Ambulatory Visit: Payer: Self-pay | Admitting: Pharmacist

## 2019-08-21 ENCOUNTER — Telehealth: Payer: Self-pay

## 2019-08-21 NOTE — Telephone Encounter (Signed)
Received call from pharmacy requesting contact information for patient. No updated information available. Sent patient a MyChart message to contact pharmacy.  Edwin Martinez

## 2019-08-31 NOTE — Telephone Encounter (Addendum)
Pharmacy called to advise they still have not been able to reach the patient and can not send the medication out until they do. She advised his last shipment was 07/26/19 and no contact since that time. Advised will make a note but this is normal for this patient. Hopefully he will call or contact them soon to get shipment.  She gave toll free number for him to call [phone] to call for him Harvoni refills

## 2019-09-15 NOTE — Telephone Encounter (Signed)
[facility] Patient Advocate Encounter  Patient called and provided his new cell number, [phone]. I called [facility] [place] so they could update their system. The person I spoke with said the patient picked up his Harvoni 12/16.   Bartholomew Crews, CPhT Specialty Pharmacy Patient [facility] Phone: [phone] Fax: [phone] 09/15/2019 10:37 AM

## 2019-10-05 ENCOUNTER — Telehealth: Payer: Self-pay | Admitting: Pharmacy Technician

## 2019-10-05 NOTE — Telephone Encounter (Signed)
[facility] Patient Advocate Encounter  Jae Dire with [facility] in [place] called stating she was having trouble reaching to the patient to fill the 3rd and final month of Harvoni.  She let me know that the phone conversations were really strange.  I called twice and each time the gentleman on the other end of the line stated I have the wrong phone number.  I then sent a HIPPA compliant email.  Jae Dire with the pharmacy said she will try again tomorrow.   Netty Starring. Dimas Aguas CPhT Specialty Pharmacy Patient [facility] Phone: [phone] Fax:  [phone]

## 2019-10-12 ENCOUNTER — Other Ambulatory Visit: Payer: Self-pay

## 2019-10-12 ENCOUNTER — Encounter: Payer: Self-pay | Admitting: Internal Medicine

## 2019-10-12 ENCOUNTER — Ambulatory Visit (INDEPENDENT_AMBULATORY_CARE_PROVIDER_SITE_OTHER): Payer: Self-pay | Admitting: Internal Medicine

## 2019-10-12 VITALS — BP 133/74 | HR 69 | Temp 98.0°F | Ht 67.0 in | Wt 165.0 lb

## 2019-10-12 DIAGNOSIS — B182 Chronic viral hepatitis C: Secondary | ICD-10-CM

## 2019-10-12 DIAGNOSIS — Z79899 Other long term (current) drug therapy: Secondary | ICD-10-CM

## 2019-10-12 DIAGNOSIS — B20 Human immunodeficiency virus [HIV] disease: Secondary | ICD-10-CM

## 2019-10-12 NOTE — Progress Notes (Signed)
Patient ID: Edwin Martinez, male   DOB: 1986-07-24, 34 y.o.   MRN: [id number]  HPI Edwin Martinez is a 34yo M with well cotnrolled hiv disease, currently taking biktarvy daily. He also has been taking harvoni as directed, not missign doses for treatment of hep c,  One more month left for harvoni. Also continues to mental health meds. No recent issues. No scares associated with exposure to covid  Outpatient Encounter Medications as of 10/12/2019  Medication Sig  . bictegravir-emtricitabine-tenofovir AF (BIKTARVY) 50-200-25 MG TABS tablet Take 1 tablet by mouth daily.  . busPIRone (BUSPAR) 5 MG tablet TAKE 1 TABLET BY MOUTH DAILY  . citalopram (CELEXA) 10 MG tablet Take 1 tablet (10 mg total) by mouth daily.  Marland Kitchen doxycycline (VIBRA-TABS) 100 MG tablet Take 1 tablet (100 mg total) by mouth 2 (two) times daily.  Marland Kitchen gabapentin (NEURONTIN) 300 MG capsule TAKE 1 CAPSULE BY MOUTH THREE TIMES DAILY  . Ledipasvir-Sofosbuvir (HARVONI) 90-400 MG TABS Take 1 tablet by mouth daily.  . nicotine (NICODERM CQ - DOSED IN MG/24 HOURS) 21 mg/24hr patch Place 1 patch (21 mg total) onto the skin daily.  . risperiDONE (RISPERDAL) 1 MG tablet Take 1 tablet (1 mg total) by mouth at bedtime.  . traZODone (DESYREL) 100 MG tablet TAKE 1 TABLET BY MOUTH AT BEDTIME AS NEEDED FOR SLEEP   No facility-administered encounter medications on file as of 10/12/2019.     Patient Active Problem List   Diagnosis Date Noted  . Cocaine dependence with cocaine-induced mood disorder (HCC)   . MDD (major depressive disorder), recurrent severe, without psychosis (HCC) 04/04/2019  . 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 Influenza A 10/11/2018  . MDD (major depressive disorder), severe (HCC) 10/06/2018  . Diarrhea due to protozoan   . Giardial enteritis   . Norovirus   . MDD (major depressive disorder) 02/26/2018  . Suicide attempt (HCC)   . Depression 11/08/2017  . Chest tightness   . Cough   . Leukocytosis   .  SOB (shortness of breath)   . Nausea vomiting and diarrhea   . Asthma exacerbation 08/15/2017  . Elevated LFTs 08/12/2017  . Cocaine abuse with cocaine-induced mood disorder (HCC) 03/30/2017  . Herpes zoster 03/06/2017  . Polysubstance abuse (HCC) 03/06/2017  . Homelessness 03/06/2017  . Schizoaffective disorder (HCC) 12/23/2016  . Suicidal ideation   . Cannabis use disorder, moderate, dependence (HCC) 07/27/2016  . Tobacco user 07/27/2016  . Intentional drug overdose (HCC) 07/18/2016  . Asthma 05/20/2007  . HIV (human immunodeficiency virus infection) (HCC) 05/05/2007   Soc hx: smoking +, no alcolhol  There are no preventive care reminders to display for this patient.   Review of Systems Review of Systems  Constitutional: Negative for fever, chills, diaphoresis, activity change, appetite change, fatigue and unexpected weight change.  HENT: Negative for congestion, sore throat, rhinorrhea, sneezing, trouble swallowing and sinus pressure.  Eyes: Negative for photophobia and visual disturbance.  Respiratory: Negative for cough, chest tightness, shortness of breath, wheezing and stridor.  Cardiovascular: Negative for chest pain, palpitations and leg swelling.  Gastrointestinal: Negative for nausea, vomiting, abdominal pain, diarrhea, constipation, blood in stool, abdominal distention and anal bleeding.  Genitourinary: Negative for dysuria, hematuria, flank pain and difficulty urinating.  Musculoskeletal: Negative for myalgias, back pain, joint swelling, arthralgias and gait problem.  Skin: Negative for color change, pallor, rash and wound.  Neurological: Negative for dizziness, tremors, weakness and light-headedness.  Hematological: Negative for adenopathy. Does not bruise/bleed easily.  Psychiatric/Behavioral: Negative for behavioral problems, confusion, sleep disturbance, dysphoric mood, decreased concentration and agitation.   Physical Exam   BP 133/74   Pulse 69   Temp 98 F  (36.7 C)   Ht 5\' 7"  (1.702 m)   Wt 165 lb (74.8 kg)   SpO2 97%   BMI 25.84 kg/m   Physical Exam  Constitutional: He is oriented to person, place, and time. He appears well-developed and well-nourished. No distress.  HENT:  Mouth/Throat: Oropharynx is clear and moist. No oropharyngeal exudate.  Cardiovascular: Normal rate, regular rhythm and normal heart sounds. Exam reveals no gallop and no friction rub.  No murmur heard.  Pulmonary/Chest: Effort normal and breath sounds normal. No respiratory distress. He has no wheezes.  Abdominal: Soft. Bowel sounds are normal. He exhibits no distension. There is no tenderness.  Lymphadenopathy:  He has no cervical adenopathy.  Neurological: He is alert and oriented to person, place, and time.  Skin: Skin is warm and dry. No rash noted. No erythema.  Psychiatric: He has a normal mood and affect. His behavior is normal.    Lab Results  Component Value Date   CD4TCELL 38 06/08/2019   Lab Results  Component Value Date   CD4TABS 1,681 06/08/2019   CD4TABS 1,154 03/22/2019   CD4TABS 1,250 08/31/2018   Lab Results  Component Value Date   HIV1RNAQUANT <20 NOT DETECTED 06/08/2019   Lab Results  Component Value Date   HEPBSAB REACTIVE (A) 09/06/2012   Lab Results  Component Value Date   LABRPR NON-REACTIVE 06/08/2019    CBC Lab Results  Component Value Date   WBC 11.3 (H) 06/08/2019   RBC 5.03 06/08/2019   HGB 14.8 06/08/2019   HCT 43.4 06/08/2019   PLT 350 06/08/2019   MCV 86.3 06/08/2019   MCH 29.4 06/08/2019   MCHC 34.1 06/08/2019   RDW 13.1 06/08/2019   LYMPHSABS 4,509 (H) 06/08/2019   MONOABS 0.5 12/27/2018   EOSABS 746 (H) 06/08/2019    BMET Lab Results  Component Value Date   NA 141 06/08/2019   K 3.7 06/08/2019   CL 108 06/08/2019   CO2 21 06/08/2019   GLUCOSE 125 (H) 06/08/2019   BUN 7 06/08/2019   CREATININE 0.81 06/08/2019   CALCIUM 9.1 06/08/2019   GFRNONAA 117 06/08/2019   GFRAA 135 06/08/2019       Assessment and Plan hiv disease = plan to check hiv labs. Anticipate that he will be well controlled  Chronic Hep c without hepatic coma= and check hep c vl as part of taking for at least 1 months  Long term medication management = will check cr function

## 2019-10-13 ENCOUNTER — Telehealth: Payer: Self-pay | Admitting: *Deleted

## 2019-10-13 LAB — T-HELPER CELL (CD4) - (RCID CLINIC ONLY)
CD4 % Helper T Cell: 38 % (ref 33–65)
CD4 T Cell Abs: 1524 /uL (ref 400–1790)

## 2019-10-13 NOTE — Telephone Encounter (Signed)
[facility] calling to report another issue in reaching patient to deliver his harvoni.  Patient in clinic yesterday, however there is now no phone number in his chart where he can be reached.  RN reached out to [name] Ladona Ridgel) to see if he is still active with them.  Will also send to our pharmacy team. Andree Coss, RN

## 2019-10-19 LAB — COMPLETE METABOLIC PANEL WITH GFR
AG Ratio: 1.7 (calc) (ref 1.0–2.5)
ALT: 10 U/L (ref 9–46)
AST: 14 U/L (ref 10–40)
Albumin: 4.4 g/dL (ref 3.6–5.1)
Alkaline phosphatase (APISO): 68 U/L (ref 36–130)
BUN: 16 mg/dL (ref 7–25)
CO2: 27 mmol/L (ref 20–32)
Calcium: 9.6 mg/dL (ref 8.6–10.3)
Chloride: 103 mmol/L (ref 98–110)
Creat: 0.96 mg/dL (ref 0.60–1.35)
GFR, Est African American: 119 mL/min/{1.73_m2} (ref >=60)
GFR, Est Non African American: 103 mL/min/{1.73_m2} (ref >=60)
Globulin: 2.6 g/dL (calc) (ref 1.9–3.7)
Glucose, Bld: 96 mg/dL (ref 65–99)
Potassium: 4.2 mmol/L (ref 3.5–5.3)
Sodium: 137 mmol/L (ref 135–146)
Total Bilirubin: 0.6 mg/dL (ref 0.2–1.2)
Total Protein: 7 g/dL (ref 6.1–8.1)

## 2019-10-19 LAB — CBC WITH DIFFERENTIAL/PLATELET
Absolute Monocytes: 621 cells/uL (ref 200–950)
Basophils Absolute: 58 cells/uL (ref 0–200)
Basophils Relative: 0.6 %
Eosinophils Absolute: 243 cells/uL (ref 15–500)
Eosinophils Relative: 2.5 %
HCT: 46.1 % (ref 38.5–50.0)
Hemoglobin: 15.4 g/dL (ref 13.2–17.1)
Lymphs Abs: 4093 cells/uL — ABNORMAL HIGH (ref 850–3900)
MCH: 29.1 pg (ref 27.0–33.0)
MCHC: 33.4 g/dL (ref 32.0–36.0)
MCV: 87.1 fL (ref 80.0–100.0)
MPV: 10.5 fL (ref 7.5–12.5)
Monocytes Relative: 6.4 %
Neutro Abs: 4685 cells/uL (ref 1500–7800)
Neutrophils Relative %: 48.3 %
Platelets: 222 10*3/uL (ref 140–400)
RBC: 5.29 10*6/uL (ref 4.20–5.80)
RDW: 12.8 % (ref 11.0–15.0)
Total Lymphocyte: 42.2 %
WBC: 9.7 10*3/uL (ref 3.8–10.8)

## 2019-10-19 LAB — HIV-1 RNA QUANT-NO REFLEX-BLD
HIV 1 RNA Quant: <20 copies/mL
HIV-1 RNA Quant, Log: <1.3 Log copies/mL

## 2019-10-19 LAB — RPR: RPR Ser Ql: NONREACTIVE

## 2019-10-19 LAB — HEPATITIS C RNA QUANTITATIVE
HCV Quantitative Log: <1.18 Log IU/mL
HCV RNA, PCR, QN: <15 IU/mL

## 2019-11-21 ENCOUNTER — Telehealth: Payer: Self-pay

## 2019-11-21 NOTE — Telephone Encounter (Signed)
Received refill request for patient's trazodone; forwarding to provider.  Arkel Cartwright Loyola Mast, RN

## 2019-11-23 ENCOUNTER — Encounter: Payer: Self-pay | Admitting: Internal Medicine

## 2019-12-06 ENCOUNTER — Emergency Department (HOSPITAL_COMMUNITY)
Admission: EM | Admit: 2019-12-06 | Discharge: 2019-12-07 | Disposition: A | Discharge status: Other Acute Inpatient Hospital | Payer: Self-pay | Attending: Emergency Medicine | Admitting: Emergency Medicine

## 2019-12-06 ENCOUNTER — Other Ambulatory Visit: Payer: Self-pay

## 2019-12-06 ENCOUNTER — Encounter (HOSPITAL_COMMUNITY): Payer: Self-pay | Admitting: Emergency Medicine

## 2019-12-06 DIAGNOSIS — Z20822 Contact with and (suspected) exposure to covid-19: Secondary | ICD-10-CM | POA: Insufficient documentation

## 2019-12-06 DIAGNOSIS — Z79899 Other long term (current) drug therapy: Secondary | ICD-10-CM | POA: Insufficient documentation

## 2019-12-06 DIAGNOSIS — I10 Essential (primary) hypertension: Secondary | ICD-10-CM | POA: Insufficient documentation

## 2019-12-06 DIAGNOSIS — B20 Human immunodeficiency virus [HIV] disease: Secondary | ICD-10-CM | POA: Insufficient documentation

## 2019-12-06 DIAGNOSIS — T50902A Poisoning by unspecified drugs, medicaments and biological substances, intentional self-harm, initial encounter: Secondary | ICD-10-CM | POA: Insufficient documentation

## 2019-12-06 DIAGNOSIS — F1410 Cocaine abuse, uncomplicated: Secondary | ICD-10-CM | POA: Insufficient documentation

## 2019-12-06 DIAGNOSIS — F323 Major depressive disorder, single episode, severe with psychotic features: Secondary | ICD-10-CM | POA: Insufficient documentation

## 2019-12-06 DIAGNOSIS — F17210 Nicotine dependence, cigarettes, uncomplicated: Secondary | ICD-10-CM | POA: Insufficient documentation

## 2019-12-06 DIAGNOSIS — F250 Schizoaffective disorder, bipolar type: Secondary | ICD-10-CM | POA: Insufficient documentation

## 2019-12-06 LAB — ACETAMINOPHEN LEVEL
Acetaminophen (Tylenol), Serum: <10 ug/mL — ABNORMAL LOW (ref 10–30)
Acetaminophen (Tylenol), Serum: <10 ug/mL — ABNORMAL LOW (ref 10–30)

## 2019-12-06 LAB — CBC
HCT: 42.5 % (ref 39.0–52.0)
Hemoglobin: 13.9 g/dL (ref 13.0–17.0)
MCH: 29 pg (ref 26.0–34.0)
MCHC: 32.7 g/dL (ref 30.0–36.0)
MCV: 88.7 fL (ref 80.0–100.0)
Platelets: 311 10*3/uL (ref 150–400)
RBC: 4.79 MIL/uL (ref 4.22–5.81)
RDW: 13.2 % (ref 11.5–15.5)
WBC: 8.6 10*3/uL (ref 4.0–10.5)
nRBC: 0 % (ref 0.0–0.2)

## 2019-12-06 LAB — RAPID URINE DRUG SCREEN, HOSP PERFORMED
Amphetamines: NOT DETECTED
Barbiturates: NOT DETECTED
Benzodiazepines: NOT DETECTED
Cocaine: POSITIVE — AB
Opiates: NOT DETECTED
Tetrahydrocannabinol: NOT DETECTED

## 2019-12-06 LAB — CBG MONITORING, ED: Glucose-Capillary: 99 mg/dL (ref 70–99)

## 2019-12-06 LAB — COMPREHENSIVE METABOLIC PANEL
ALT: 16 U/L (ref 0–44)
AST: 22 U/L (ref 15–41)
Albumin: 3.7 g/dL (ref 3.5–5.0)
Alkaline Phosphatase: 121 U/L (ref 38–126)
Anion gap: 16 — ABNORMAL HIGH (ref 5–15)
BUN: 11 mg/dL (ref 6–20)
CO2: 20 mmol/L — ABNORMAL LOW (ref 22–32)
Calcium: 9.3 mg/dL (ref 8.9–10.3)
Chloride: 106 mmol/L (ref 98–111)
Creatinine, Ser: 0.85 mg/dL (ref 0.61–1.24)
GFR calc Af Amer: >60 mL/min (ref >60)
GFR calc non Af Amer: >60 mL/min (ref >60)
Glucose, Bld: 114 mg/dL — ABNORMAL HIGH (ref 70–99)
Potassium: 3.8 mmol/L (ref 3.5–5.1)
Sodium: 142 mmol/L (ref 135–145)
Total Bilirubin: 0.5 mg/dL (ref 0.3–1.2)
Total Protein: 6.2 g/dL — ABNORMAL LOW (ref 6.5–8.1)

## 2019-12-06 LAB — ETHANOL: Alcohol, Ethyl (B): 44 mg/dL — ABNORMAL HIGH (ref <10)

## 2019-12-06 LAB — SALICYLATE LEVEL: Salicylate Lvl: <7 mg/dL — ABNORMAL LOW (ref 7.0–30.0)

## 2019-12-06 LAB — SARS CORONAVIRUS 2 (TAT 6-24 HRS): SARS Coronavirus 2: NEGATIVE

## 2019-12-06 MED ORDER — LEDIPASVIR-SOFOSBUVIR 90-400 MG PO TABS: 1.0000 | ORAL_TABLET | Freq: Every day | ORAL | Status: DC

## 2019-12-06 MED ORDER — RISPERIDONE 1 MG PO TABS
1.0000 mg | ORAL_TABLET | Freq: Every day | ORAL | Status: DC
  Administered 2019-12-06: 1 mg via ORAL
  Filled 2019-12-06: qty 1

## 2019-12-06 MED ORDER — CITALOPRAM HYDROBROMIDE 10 MG PO TABS
10.0000 mg | ORAL_TABLET | Freq: Every day | ORAL | Status: DC
  Filled 2019-12-06 (×2): qty 1

## 2019-12-06 MED ORDER — TRAZODONE HCL 100 MG PO TABS: 100.0000 mg | ORAL_TABLET | Freq: Every evening | ORAL | Status: DC | PRN

## 2019-12-06 MED ORDER — NICOTINE 21 MG/24HR TD PT24
21.0000 mg | MEDICATED_PATCH | Freq: Every day | TRANSDERMAL | Status: DC
  Filled 2019-12-06: qty 1

## 2019-12-06 MED ORDER — GABAPENTIN 300 MG PO CAPS
300.0000 mg | ORAL_CAPSULE | Freq: Three times a day (TID) | ORAL | Status: DC
  Administered 2019-12-06 – 2019-12-07 (×4): 300 mg via ORAL
  Filled 2019-12-06 (×4): qty 1

## 2019-12-06 MED ORDER — SODIUM CHLORIDE 0.9 % IV BOLUS (SEPSIS)
1000.0000 mL | Freq: Once | INTRAVENOUS | Status: AC
  Administered 2019-12-06: 1000 mL via INTRAVENOUS

## 2019-12-06 MED ORDER — BICTEGRAVIR-EMTRICITAB-TENOFOV 50-200-25 MG PO TABS
1.0000 | ORAL_TABLET | Freq: Every day | ORAL | Status: DC
  Administered 2019-12-07: 1 via ORAL
  Filled 2019-12-06 (×2): qty 1

## 2019-12-06 NOTE — ED Provider Notes (Signed)
Pt remains stable.  Vitals stable. He is alert and awake.  Denies any new complaints at this time.   Awaiting tts assessment.    The patient has been placed in psychiatric observation due to the need to provide a safe environment for the patient while obtaining psychiatric consultation and evaluation, as well as ongoing medical and medication management to treat the patient's condition.      Linwood Dibbles, MD 12/06/19 [phone]

## 2019-12-06 NOTE — ED Notes (Signed)
TTS machine placed at bedside.   

## 2019-12-06 NOTE — ED Notes (Signed)
Poison Control [place] 800 [id number]; updates given to [facility], no changes to earlier recommendations of monitoring patient. May safely remove by noon.

## 2019-12-06 NOTE — BH Assessment (Signed)
Tele Assessment Note   Patient Name: Edwin Martinez MRN: [id number] Referring Physician: Jaquita Martinez Location of Patient: [facility] Location of Provider: [facility] Department  Edwin Martinez is a 34 y.o. male who presented to [facility] on a voluntary basis following an intentional overdose on 30 tabs of Buspar.  Pt lives in [place] with friends, and he is unemployed and seeking disability.  Pt reported that he was previously treated at [facility], but is no longer receiving services there.  Pt stated that he was prescribed Buspar, Neurontin, Trazodone, Risperidone, and Celexa.    Pt stated that he intentionally overdosed on 30 tabs of Buspar in a self-described suicide attempt.  The reason he attempted suicide is that he feels his medication is not working, and he feels helpless.  Pt reported that he has attempted suicide several times in the past.  He endorsed persistent and unremitting despondency; insomnia; auditory hallucinations (voices urging him to harm himself and telling him he is a bad person); isolation; fatigue; irritability.  Pt also endorsed episodic use of crack cocaine ''to self-medicate'' -- Pt stated that he used $20 worth on 12/05/19.  He also endorsed use of marijuana and alcohol.  BAC on admission was .44.  Pt requested inpatient care.  Pt denied self-injurious behavior and substance use concerns.  During assessment, Pt presented as alert and oriented.  He had good eye contact and was cooperative.  Pt was in scrubs, and he appeared disheveled.  Pt's mood was depressed.  Affect was mood-congruent.  Pt's speech was normal in rate, rhythm, and volume.  Thought processes were within normal range, and thought content was logical and goal-oriented.  Memory and concentration were fair.  Insight and judgment were fair.  Impulse control was poor as evidenced by recent overdose.  Consulted with Edwin Martinez, [place], who determined that Pt meets inpatient criteria.  Diagnosis:  Schizoaffective Disorder, Bipolar type, Depressed, Severe w/psychotic features  Past Medical History:  Past Medical History:  Diagnosis Date  . ADD (attention deficit disorder)   . Anxiety   . Asthma   . Bipolar 1 disorder ([place])   . Depression   . HIV (human immunodeficiency virus infection) ([place]) dx'd 2008  . Hypertension   . Insomnia   . Intentional drug overdose ([place])    Edwin Martinez 02/25/2018  . Polysubstance abuse ([place])    Edwin Martinez 02/25/2018  . Schizophrenia ([place])   . Seizures ([place])    "used to have little black-out szs where I'd drop out for 2-3 min then come back; nothing in the last 2-3-4years" (02/25/2018)  . Shingles     Past Surgical History:  Procedure Laterality Date  . DENTAL SURGERY     "had my eye teeth pulled down"    Family History:  Family History  Problem Relation Age of Onset  . Huntington's disease Father   . Heart disease Mother   . Suicidality Maternal Uncle   . Suicidality Maternal Grandmother     Social History:  reports that he has been smoking cigarettes. He started smoking about 28 years ago. He has a 10.00 pack-year smoking history. He has never used smokeless tobacco. He reports current alcohol use of about 4.0 standard drinks of alcohol per week. He reports current drug use. Drugs: Marijuana and "Crack" cocaine.  Additional Social History:  Alcohol / Drug Use Pain Medications: See MAR Prescriptions: See MAR Over the Counter: See MAR History of alcohol / drug use?: Yes Substance #1 Name of Substance 1: Crack Cocaine 1 -  Amount (size/oz): Varied 1 - Frequency: Episodic 1 - Duration: Ongoing 1 - Last Use / Amount: 12/06/19 -- $20 worth Substance #2 Name of Substance 2: Alcohol 2 - Amount (size/oz): Varied 2 - Frequency: Weekly 2 - Duration: Ongoing 2 - Last Use / Amount: 12/06/19:  .44 BAC Substance #3 Name of Substance 3: Marijuana  CIWA: CIWA-Ar BP: 131/72 Pulse Rate: 60 COWS:    Allergies:  Allergies  Allergen Reactions  .  Magnesium-Containing Compounds Other (See Comments)    This medication is contraindicated with pts HIV meds.    . Peanut-Containing Drug Products Anaphylaxis  . Esomeprazole Magnesium Cough  . Atripla [Efavirenz-Emtricitab-Tenofovir] Other (See Comments)    Reaction:  Suicidal thoughts   . Bactrim [Sulfamethoxazole-Trimethoprim]   . Bactrim [Sulfamethoxazole-Trimethoprim] Rash  . Penicillins Rash and Other (See Comments)    Has patient had a PCN reaction causing immediate rash, facial/tongue/throat swelling, SOB or lightheadedness with hypotension: Yes Has patient had a PCN reaction causing severe rash involving mucus membranes or skin necrosis: No Has patient had a PCN reaction that required hospitalization No Has patient had a PCN reaction occurring within the last 10 years: No If all of the above answers are "NO", then may proceed with Cephalosporin use.    Home Medications: (Not in a hospital admission)   OB/GYN Status:  No LMP for male patient.  General Assessment Data TTS Assessment: In system Is this a Tele or Face-to-Face Assessment?: Tele Assessment Is this an Initial Assessment or a Re-assessment for this encounter?: Initial Assessment Patient Accompanied by:: N/A Language Other than English: No Living Arrangements: Other (Comment) What gender do you identify as?: Male Marital status: Single Pregnancy Status: No Living Arrangements: Non-relatives/Friends Can pt return to current living arrangement?: Yes Admission Status: Voluntary Is patient capable of signing voluntary admission?: Yes Referral Source: Self/Family/Friend Insurance type: None     Crisis Care Plan Living Arrangements: Non-relatives/Friends Name of Psychiatrist: None currently Name of Therapist: None currently  Education Status Is patient currently in school?: No Is the patient employed, unemployed or receiving disability?: Unemployed  Risk to self with the past 6 months Suicidal Ideation:  Yes-Currently Present Has patient been a risk to self within the past 6 months prior to admission? : No Suicidal Intent: Yes-Currently Present Has patient had any suicidal intent within the past 6 months prior to admission? : No Is patient at risk for suicide?: Yes Suicidal Plan?: Yes-Currently Present Has patient had any suicidal plan within the past 6 months prior to admission? : No Specify Current Suicidal Plan: Pt overdosed on 30 tabs Buspar Access to Means: Yes Specify Access to Suicidal Means: Prescribed meds What has been your use of drugs/alcohol within the last 12 months?: Crack cocaine, marijuana, alcohol Previous Attempts/Gestures: Yes How many times?: (Multiple) Other Self Harm Risks: Substance use Triggers for Past Attempts: Unpredictable Intentional Self Injurious Behavior: None Family Suicide History: Unknown Recent stressful life event(s): Other (Comment)(''Meds aren't working'') Persecutory voices/beliefs?: Yes Depression: Yes Depression Symptoms: Despondent, Insomnia, Isolating, Fatigue, Loss of interest in usual pleasures, Feeling worthless/self pity, Feeling angry/irritable Substance abuse history and/or treatment for substance abuse?: Yes Suicide prevention information given to non-admitted patients: Not applicable  Risk to Others within the past 6 months Homicidal Ideation: No Does patient have any lifetime risk of violence toward others beyond the six months prior to admission? : No Thoughts of Harm to Others: No Current Homicidal Intent: No Current Homicidal Plan: No Access to Homicidal Means: No History of harm to  others?: No Assessment of Violence: None Noted Does patient have access to weapons?: No Criminal Charges Pending?: Yes Describe Pending Criminal Charges: Possession; Misuse 911; B&E, Larceny Does patient have a court date: Yes  Psychosis Hallucinations: Auditory, With command Delusions: None noted  Mental Status Report Appearance/Hygiene:  Excess accessories, In scrubs Eye Contact: Good Motor Activity: Freedom of movement Speech: Logical/coherent Level of Consciousness: Alert Mood: Depressed Affect: Appropriate to circumstance Anxiety Level: Minimal Thought Processes: Relevant, Coherent Judgement: Partial Orientation: Person, Place, Time, Situation Obsessive Compulsive Thoughts/Behaviors: None  Cognitive Functioning Concentration: Normal Memory: Recent Intact, Remote Intact Is patient IDD: No Insight: Fair Impulse Control: Poor Appetite: Fair Have you had any weight changes? : No Change Sleep: Decreased Total Hours of Sleep: (Mixed)  ADLScreening [facility] Assessment Services) Patient's cognitive ability adequate to safely complete daily activities?: Yes Patient able to express need for assistance with ADLs?: Yes Independently performs ADLs?: Yes (appropriate for developmental age)  Prior Inpatient Therapy Prior Inpatient Therapy: Yes Prior Therapy Dates: Multiple Prior Therapy Facilty/Provider(s): Multiple Reason for Treatment: Schizophrenia; Biplar  Prior Outpatient Therapy Prior Outpatient Therapy: Yes Prior Therapy Dates: 2020 Prior Therapy Facilty/Provider(s): [name] Reason for Treatment: Bipolar Does patient have an ACCT team?: No Does patient have Intensive In-House Services?  : No Does patient have [name] services? : No Does patient have P4CC services?: No  ADL Screening (condition at time of admission) Patient's cognitive ability adequate to safely complete daily activities?: Yes Is the patient deaf or have difficulty hearing?: No Does the patient have difficulty seeing, even when wearing glasses/contacts?: No Does the patient have difficulty concentrating, remembering, or making decisions?: No Patient able to express need for assistance with ADLs?: Yes Does the patient have difficulty dressing or bathing?: No Independently performs ADLs?: Yes (appropriate for developmental age) Does the  patient have difficulty walking or climbing stairs?: No Weakness of Legs: None Weakness of Arms/Hands: None  Home Assistive Devices/Equipment Home Assistive Devices/Equipment: None  Therapy Consults (therapy consults require a physician order) PT Evaluation Needed: No OT Evalulation Needed: No SLP Evaluation Needed: No Abuse/Neglect Assessment (Assessment to be complete while patient is alone) Abuse/Neglect Assessment Can Be Completed: Yes Physical Abuse: Denies Verbal Abuse: Denies Sexual Abuse: Denies Exploitation of patient/patient's resources: Denies Self-Neglect: Denies Values / Beliefs Cultural Requests During Hospitalization: None Spiritual Requests During Hospitalization: None Consults Spiritual Care Consult Needed: No Transition of Care Team Consult Needed: No Advance Directives (For Healthcare) Does Patient Have a Medical Advance Directive?: No          Disposition:  Disposition Initial Assessment Completed for this Encounter: Yes Disposition of Patient: Admit(Per L. Maisie Fus, FNP)  This service was provided via telemedicine using a 2-way, interactive audio and Immunologist.  Names of all persons participating in this telemedicine service and their role in this encounter. Name: Edwin Martinez Role: Patient             Earline Mayotte 12/06/2019 11:34 AM

## 2019-12-06 NOTE — ED Notes (Signed)
Breakfast ordered 

## 2019-12-06 NOTE — ED Notes (Signed)
Pt ambulatory to room, steady gait noted.

## 2019-12-06 NOTE — Progress Notes (Signed)
Pt accepted to [facility]; 1 [place]. Room to be determined.      Dr. Estill Cotta was accepting and attending physician.    Call report to [phone].   Dr. Lynelle Doctor @ [facility]  ED notified.     Pt is Voluntary.    Pt may be transported by [employer].   Pt scheduled to arrive any time tomorrow on 3/11 after 7:00am.   [facility] requested paper prescription for HIV meds so that they can fill them*.   Drucilla Schmidt, MSW, LCSW-A Clinical Disposition Social Worker [facility]/TTS [phone]

## 2019-12-06 NOTE — ED Provider Notes (Signed)
TIME SEEN: 5:24 AM  CHIEF COMPLAINT: Intentional overdose  HPI: Patient is a 34 year old male with history of HIV, hypertension, polysubstance abuse, schizophrenia, bipolar disorder, polysubstance abuse who presents to the emergency department with a suicide attempt.  States that he took 30 tablets of Buspar at 4am in an attempt to kill himself.  No other coingestions.  Reports hours previous he had smoked crack cocaine to "numb the pain".  No alcohol use.  No HI.  Denies hallucinations.  Has had previous suicide attempts.  Patient is currently homeless.  Denies any current symptoms.   ROS: See HPI Constitutional: no fever  Eyes: no drainage  ENT: no runny nose   Cardiovascular:  no chest pain  Resp: no SOB  GI: no vomiting GU: no dysuria Integumentary: no rash  Allergy: no hives  Musculoskeletal: no leg swelling  Neurological: no slurred speech ROS otherwise negative  PAST MEDICAL HISTORY/PAST SURGICAL HISTORY:  Past Medical History:  Diagnosis Date  . ADD (attention deficit disorder)   . Anxiety   . Asthma   . Bipolar 1 disorder ([place])   . Depression   . HIV (human immunodeficiency virus infection) ([place]) dx'd 2008  . Hypertension   . Insomnia   . Intentional drug overdose ([place])    Edwin Martinez 02/25/2018  . Polysubstance abuse ([place])    Edwin Martinez 02/25/2018  . Schizophrenia ([place])   . Seizures ([place])    "used to have little black-out szs where I'd drop out for 2-3 min then come back; nothing in the last 2-3-4years" (02/25/2018)  . Shingles     MEDICATIONS:  Prior to Admission medications   Medication Sig Start Date End Date Taking? Authorizing Provider  bictegravir-emtricitabine-tenofovir AF (BIKTARVY) 50-200-25 MG TABS tablet Take 1 tablet by mouth daily. 04/17/19   Carlyle Basques, MD  busPIRone (BUSPAR) 5 MG tablet TAKE 1 TABLET BY MOUTH DAILY 07/21/19   Carlyle Basques, MD  citalopram (CELEXA) 10 MG tablet Take 1 tablet (10 mg total) by mouth daily. 06/08/19   Carlyle Basques, MD   doxycycline (VIBRA-TABS) 100 MG tablet Take 1 tablet (100 mg total) by mouth 2 (two) times daily. 06/08/19   Carlyle Basques, MD  gabapentin (NEURONTIN) 300 MG capsule TAKE 1 CAPSULE BY MOUTH THREE TIMES DAILY 07/21/19   Carlyle Basques, MD  Ledipasvir-Sofosbuvir (HARVONI) 90-400 MG TABS Take 1 tablet by mouth daily. 07/12/19   Carlyle Basques, MD  nicotine (NICODERM CQ - DOSED IN MG/24 HOURS) 21 mg/24hr patch Place 1 patch (21 mg total) onto the skin daily. 04/11/19   Connye Burkitt, NP  risperiDONE (RISPERDAL) 1 MG tablet Take 1 tablet (1 mg total) by mouth at bedtime. 06/08/19   Carlyle Basques, MD  traZODone (DESYREL) 100 MG tablet TAKE 1 TABLET BY MOUTH AT BEDTIME AS NEEDED FOR SLEEP 07/21/19   Carlyle Basques, MD    ALLERGIES:  Allergies  Allergen Reactions  . Magnesium-Containing Compounds Other (See Comments)    This medication is contraindicated with pts HIV meds.    . Peanut-Containing Drug Products Anaphylaxis  . Esomeprazole Magnesium Cough  . Atripla [Efavirenz-Emtricitab-Tenofovir] Other (See Comments)    Reaction:  Suicidal thoughts   . Bactrim [Sulfamethoxazole-Trimethoprim]   . Bactrim [Sulfamethoxazole-Trimethoprim] Rash  . Penicillins Rash and Other (See Comments)    Has patient had a PCN reaction causing immediate rash, facial/tongue/throat swelling, SOB or lightheadedness with hypotension: Yes Has patient had a PCN reaction causing severe rash involving mucus membranes or skin necrosis: No Has patient had a PCN reaction  that required hospitalization No Has patient had a PCN reaction occurring within the last 10 years: No If all of the above answers are "NO", then may proceed with Cephalosporin use.    SOCIAL HISTORY:  Social History   Tobacco Use  . Smoking status: Current Every Day Smoker    Packs/day: 0.50    Years: 20.00    Pack years: 10.00    Types: Cigarettes    Start date: 09/29/1991  . Smokeless tobacco: Never Used  Substance Use Topics  . Alcohol use:  Yes    Alcohol/week: 4.0 standard drinks    Types: 4 Cans of beer per week    Comment: occasionally, "socially"    FAMILY HISTORY: Family History  Problem Relation Age of Onset  . Huntington's disease Father   . Heart disease Mother   . Suicidality Maternal Uncle   . Suicidality Maternal Grandmother     EXAM: BP 133/74 (BP Location: Right Arm)   Pulse 73   Temp 98.6 F (37 C) (Oral)   Resp 16   Ht 5\' 7"  (1.702 m)   Wt 70.3 kg   SpO2 99%   BMI 24.28 kg/m  CONSTITUTIONAL: Alert and oriented and responds appropriately to questions.  Disheveled, in no distress HEAD: Normocephalic EYES: Conjunctivae clear, pupils appear equal, EOM appear intact ENT: normal nose; moist mucous membranes NECK: Supple, normal ROM CARD: RRR; S1 and S2 appreciated; no murmurs, no clicks, no rubs, no gallops RESP: Normal chest excursion without splinting or tachypnea; breath sounds clear and equal bilaterally; no wheezes, no rhonchi, no rales, no hypoxia or respiratory distress, speaking full sentences ABD/GI: Normal bowel sounds; non-distended; soft, non-tender, no rebound, no guarding, no peritoneal signs, no hepatosplenomegaly BACK:  The back appears normal EXT: Normal ROM in all joints; no deformity noted, no edema; no cyanosis SKIN: Normal color for age and race; warm; no rash on exposed skin NEURO: Moves all extremities equally PSYCH: Reports SI with attempt tonight.  No HI or hallucinations.  Good eye contact.  Flat affect.  MEDICAL DECISION MAKING: Patient here with intentional Buspar overdose.  Discussed with poison control.  Watch for CNS depression.  Seizures are possible as is clonus, abnormal muscle movements.  Treatment for agitation, seizures would be benzodiazepines.  Can sometimes see hypotension.  Recommend repeat tylenol level at 8 am.  They recommend monitoring for 8 hours.  Patient will be medically cleared at noon.  ED PROGRESS: Patient continues to be resting comfortably.   Hemodynamically stable.  Repeat Tylenol level ordered for 8 AM.  Signed out to oncoming physician Dr. to reassess patient, follow-up on Tylenol level and consult TTS at noon.   I reviewed all nursing notes and pertinent previous records as available.  I have interpreted any EKGs, lab and urine results, imaging (as available).     EKG Interpretation  Date/Time:  Wednesday December 06 2019 04:31:06 EST Ventricular Rate:  66 PR Interval:  160 QRS Duration: 90 QT Interval:  384 QTC Calculation: 402 R Axis:   86 Text Interpretation: Normal sinus rhythm with sinus arrhythmia Normal ECG Confirmed by [birth year] (Ross Marcus) on 12/06/2019 4:49:49 AM       CRITICAL CARE Performed by: 02/05/2020 Mell Mellott   Total critical care time: 50 minutes  Critical care time was exclusive of separately billable procedures and treating other patients.  Critical care was necessary to treat or prevent imminent or life-threatening deterioration.  Critical care was time spent personally by me on the following activities:  development of treatment plan with patient and/or surrogate as well as nursing, discussions with consultants, evaluation of patient's response to treatment, examination of patient, obtaining history from patient or surrogate, ordering and performing treatments and interventions, ordering and review of laboratory studies, ordering and review of radiographic studies, pulse oximetry and re-evaluation of patient's condition.   Edwin Martinez was evaluated in Emergency Department on 12/06/2019 for the symptoms described in the history of present illness. He was evaluated in the context of the global COVID-19 pandemic, which necessitated consideration that the patient might be at risk for infection with the SARS-CoV-2 virus that causes COVID-19. Institutional protocols and algorithms that pertain to the evaluation of patients at risk for COVID-19 are in a state of rapid change based on information  released by regulatory bodies including the CDC and federal and state organizations. These policies and algorithms were followed during the patient's care in the ED.  Patient was seen wearing N95, face shield, gloves.    Juvenal Umar, Layla Maw, DO 12/06/19 [phone]

## 2019-12-06 NOTE — ED Triage Notes (Signed)
Pt reports he took an overdose of 30 Buspirone 5mg  around 4am.  He states he has also been "self medicated on crack cocaine" w/ the last use being 3 hours ago.  Pt states his medications are not working and that the death anniversery of his mother is coming up.

## 2019-12-06 NOTE — ED Notes (Signed)
Pt changed in to scrubs and wonded by security

## 2019-12-06 NOTE — Progress Notes (Signed)
Per Denzil Magnuson, NP. Patient meets inpatient criteria. Patient has been faxed out to the following facilities for review:   [facility]  [facility]  [facility]  [facility]  [facility]   [facility]  [facility]   [facility]  [facility]  CSW will continue to follow and assist with disposition planning.   Drucilla Schmidt, MSW, LCSW-A Clinical Disposition Social Worker [facility]/TTS [phone]

## 2019-12-07 MED ORDER — BICTEGRAVIR-EMTRICITAB-TENOFOV 50-200-25 MG PO TABS: 1.0000 | ORAL_TABLET | Freq: Every day | ORAL | 0 refills | Status: DC

## 2019-12-07 NOTE — ED Provider Notes (Signed)
  Physical Exam  BP 109/72 (BP Location: Left Arm)   Pulse (!) 53   Temp 97.9 F (36.6 C) (Oral)   Resp 18   Ht 5\' 7"  (1.702 m)   Wt 70.3 kg   SpO2 97%   BMI 24.28 kg/m   Physical Exam  ED Course/Procedures     Procedures  MDM  Patient has bed at [facility]. Stable for transfer       Estill Cotta, MD 12/07/19 [phone]

## 2019-12-07 NOTE — ED Notes (Signed)
Breakfast ordered 

## 2019-12-07 NOTE — ED Notes (Signed)
BIKTARVY AND HARVONI given to pt at this time from his personal supply in backpack. Edwin Martinez, case Production designer, theatre/television/film, getting refill of Biktarvy from [employer] so patient will have enough for stay at [facility]. Meds will be sent with pt so that staff at [facility] can administer.

## 2019-12-19 ENCOUNTER — Other Ambulatory Visit: Payer: Self-pay

## 2019-12-19 ENCOUNTER — Ambulatory Visit (INDEPENDENT_AMBULATORY_CARE_PROVIDER_SITE_OTHER): Payer: Self-pay | Admitting: Internal Medicine

## 2019-12-19 ENCOUNTER — Encounter: Payer: Self-pay | Admitting: Internal Medicine

## 2019-12-19 VITALS — BP 134/73 | HR 86 | Wt 156.0 lb

## 2019-12-19 DIAGNOSIS — B20 Human immunodeficiency virus [HIV] disease: Secondary | ICD-10-CM

## 2019-12-19 DIAGNOSIS — B182 Chronic viral hepatitis C: Secondary | ICD-10-CM

## 2019-12-19 DIAGNOSIS — K0889 Other specified disorders of teeth and supporting structures: Secondary | ICD-10-CM

## 2019-12-19 DIAGNOSIS — G2401 Drug induced subacute dyskinesia: Secondary | ICD-10-CM

## 2019-12-19 DIAGNOSIS — Z0000 Encounter for general adult medical examination without abnormal findings: Secondary | ICD-10-CM

## 2019-12-19 NOTE — Progress Notes (Signed)
RFV : follow up for hiv disease  Patient ID: Edwin Martinez, male   DOB: 1986-09-23, 34 y.o.   MRN: [id number]  HPI Edwin Martinez is 34yo M with hiv disease, and chronic hep C. Had a gap at getting last month of treatment for hep c.  On his last week of harvoni ( did have a break ). He also had psych admission for suicidal attempt/overdose with medication. He states that he is feeling better.  Now on zyprexa and celexa and cogentin Also increase on neurontin 300mg  tid  No longer on trazodone  He notices that he is moving uncontrollably but unchanged in the last few weeks Outpatient Encounter Medications as of 12/19/2019  Medication Sig  . bictegravir-emtricitabine-tenofovir AF (BIKTARVY) 50-200-25 MG TABS tablet Take 1 tablet by mouth daily.  . citalopram (CELEXA) 10 MG tablet Take 1 tablet (10 mg total) by mouth daily.  . Ledipasvir-Sofosbuvir (HARVONI) 90-400 MG TABS Take 1 tablet by mouth daily.  . busPIRone (BUSPAR) 5 MG tablet TAKE 1 TABLET BY MOUTH DAILY (Patient not taking: Reported on 12/06/2019)  . gabapentin (NEURONTIN) 300 MG capsule TAKE 1 CAPSULE BY MOUTH THREE TIMES DAILY  . nicotine (NICODERM CQ - DOSED IN MG/24 HOURS) 21 mg/24hr patch Place 1 patch (21 mg total) onto the skin daily. (Patient not taking: Reported on 12/06/2019)  . risperiDONE (RISPERDAL) 1 MG tablet Take 1 tablet (1 mg total) by mouth at bedtime. (Patient not taking: Reported on 12/19/2019)  . traZODone (DESYREL) 100 MG tablet TAKE 1 TABLET BY MOUTH AT BEDTIME AS NEEDED FOR SLEEP (Patient not taking: Reported on 12/19/2019)   No facility-administered encounter medications on file as of 12/19/2019.     Patient Active Problem List   Diagnosis Date Noted  . Cocaine dependence with cocaine-induced mood disorder ([place])   . MDD (major depressive disorder), recurrent severe, without psychosis ([place]) 04/04/2019  . Cocaine-induced mood disorder ([place]) 01/17/2019  . Influenza A 10/11/2018  . MDD (major depressive  disorder), severe ([place]) 10/06/2018  . Diarrhea due to protozoan   . Giardial enteritis   . Norovirus   . MDD (major depressive disorder) 02/26/2018  . Suicide attempt ([place])   . Depression 11/08/2017  . Chest tightness   . Cough   . Leukocytosis   . SOB (shortness of breath)   . Nausea vomiting and diarrhea   . Asthma exacerbation 08/15/2017  . Elevated LFTs 08/12/2017  . Cocaine abuse with cocaine-induced mood disorder ([place]) 03/30/2017  . Herpes zoster 03/06/2017  . Polysubstance abuse ([place]) 03/06/2017  . Homelessness 03/06/2017  . Schizoaffective disorder ([place]) 12/23/2016  . Suicidal ideation   . Cannabis use disorder, moderate, dependence ([place]) 07/27/2016  . Tobacco user 07/27/2016  . Intentional drug overdose ([place]) 07/18/2016  . Asthma 05/20/2007  . HIV (human immunodeficiency virus infection) ([place]) 05/05/2007     There are no preventive care reminders to display for this patient.   Review of Systems  Physical Exam   BP 134/73   Pulse 86   Wt 156 lb (70.8 kg)   BMI 24.43 kg/m    Lab Results  Component Value Date   CD4TCELL 38 10/12/2019   Lab Results  Component Value Date   CD4TABS 1,524 10/12/2019   CD4TABS 1,681 06/08/2019   CD4TABS 1,154 03/22/2019   Lab Results  Component Value Date   HIV1RNAQUANT <20 NOT DETECTED 10/12/2019   Lab Results  Component Value Date   HEPBSAB REACTIVE (A) 09/06/2012   Lab Results  Component  Value Date   LABRPR NON-REACTIVE 10/12/2019    CBC Lab Results  Component Value Date   WBC 8.6 12/06/2019   RBC 4.79 12/06/2019   HGB 13.9 12/06/2019   HCT 42.5 12/06/2019   PLT 311 12/06/2019   MCV 88.7 12/06/2019   MCH 29.0 12/06/2019   MCHC 32.7 12/06/2019   RDW 13.2 12/06/2019   LYMPHSABS 4,093 (H) 10/12/2019   MONOABS 0.5 12/27/2018   EOSABS 243 10/12/2019    BMET Lab Results  Component Value Date   NA 142 12/06/2019   K 3.8 12/06/2019   CL 106 12/06/2019   CO2 20 (L) 12/06/2019   GLUCOSE 114 (H)  12/06/2019   BUN 11 12/06/2019   CREATININE 0.85 12/06/2019   CALCIUM 9.3 12/06/2019   GFRNONAA >60 12/06/2019   GFRAA >60 12/06/2019      Assessment and Plan  Tardive dyskinesia = polypharmacy. He would like buspar back but feel that this would contribute to symptoms. Would like for him to see psychiatry to fine tune his regimen. Recommend that he takes cogentin daily not prn  Tooth pain = need dental appointment  Hepatitis C chronic = finishes harvoni this week.   Needs social secrutiy card- will place in contact with bridge counselor  Health maintenance =  covid vaccine [place] [facility] at [facility]

## 2019-12-21 ENCOUNTER — Ambulatory Visit: Payer: No Typology Code available for payment source | Attending: Internal Medicine

## 2019-12-21 LAB — CBC WITH DIFFERENTIAL/PLATELET
Absolute Monocytes: 730 cells/uL (ref 200–950)
Basophils Absolute: 87 cells/uL (ref 0–200)
Basophils Relative: 0.8 %
Eosinophils Absolute: 403 cells/uL (ref 15–500)
Eosinophils Relative: 3.7 %
HCT: 43.5 % (ref 38.5–50.0)
Hemoglobin: 14.3 g/dL (ref 13.2–17.1)
Lymphs Abs: 3259 cells/uL (ref 850–3900)
MCH: 28.6 pg (ref 27.0–33.0)
MCHC: 32.9 g/dL (ref 32.0–36.0)
MCV: 87 fL (ref 80.0–100.0)
MPV: 10.2 fL (ref 7.5–12.5)
Monocytes Relative: 6.7 %
Neutro Abs: 6420 cells/uL (ref 1500–7800)
Neutrophils Relative %: 58.9 %
Platelets: 297 10*3/uL (ref 140–400)
RBC: 5 10*6/uL (ref 4.20–5.80)
RDW: 12.7 % (ref 11.0–15.0)
Total Lymphocyte: 29.9 %
WBC: 10.9 10*3/uL — ABNORMAL HIGH (ref 3.8–10.8)

## 2019-12-21 LAB — COMPLETE METABOLIC PANEL WITH GFR
AG Ratio: 1.7 (calc) (ref 1.0–2.5)
ALT: 16 U/L (ref 9–46)
AST: 22 U/L (ref 10–40)
Albumin: 4.4 g/dL (ref 3.6–5.1)
Alkaline phosphatase (APISO): 101 U/L (ref 36–130)
BUN: 21 mg/dL (ref 7–25)
CO2: 25 mmol/L (ref 20–32)
Calcium: 9.4 mg/dL (ref 8.6–10.3)
Chloride: 106 mmol/L (ref 98–110)
Creat: 0.8 mg/dL (ref 0.60–1.35)
GFR, Est African American: 135 mL/min/{1.73_m2} (ref >=60)
GFR, Est Non African American: 117 mL/min/{1.73_m2} (ref >=60)
Globulin: 2.6 g/dL (calc) (ref 1.9–3.7)
Glucose, Bld: 124 mg/dL — ABNORMAL HIGH (ref 65–99)
Potassium: 3.9 mmol/L (ref 3.5–5.3)
Sodium: 138 mmol/L (ref 135–146)
Total Bilirubin: 0.5 mg/dL (ref 0.2–1.2)
Total Protein: 7 g/dL (ref 6.1–8.1)

## 2019-12-21 LAB — HEPATITIS C RNA QUANTITATIVE
HCV Quantitative Log: <1.18 Log IU/mL — AB
HCV RNA, PCR, QN: <15 IU/mL — AB

## 2019-12-21 LAB — HIV-1 RNA QUANT-NO REFLEX-BLD
HIV 1 RNA Quant: <20 copies/mL — AB
HIV-1 RNA Quant, Log: <1.3 Log copies/mL — AB

## 2019-12-22 ENCOUNTER — Ambulatory Visit: Payer: Self-pay | Admitting: Internal Medicine

## 2020-01-03 ENCOUNTER — Ambulatory Visit: Payer: Self-pay | Admitting: Internal Medicine

## 2020-03-20 ENCOUNTER — Ambulatory Visit: Payer: Self-pay | Admitting: Internal Medicine

## 2020-04-25 ENCOUNTER — Ambulatory Visit: Payer: Self-pay | Admitting: Internal Medicine

## 2020-04-29 ENCOUNTER — Telehealth: Payer: Self-pay | Admitting: *Deleted

## 2020-04-29 NOTE — Telephone Encounter (Signed)
Patient arrived too late for his scheduled appointment last week. Edwin Martinez and this RN met with patient. He states he was recently diagnosed with Huntington's disease, is motivated to get back into care and "get right." He has contacted [name] and left his information for an intake appointment. RN obtained current, correct phone number from [facility], called patient and left message to offer him an appointment with Dr Baxter Flattery this week (Wednesday 8/4).  Will need to hear from patient to see if the available times work for him. Landis Gandy, RN

## 2020-05-15 ENCOUNTER — Other Ambulatory Visit: Payer: Self-pay | Admitting: Internal Medicine

## 2020-06-11 ENCOUNTER — Ambulatory Visit: Payer: Self-pay | Admitting: Internal Medicine

## 2020-06-15 ENCOUNTER — Other Ambulatory Visit: Payer: Self-pay | Admitting: Internal Medicine

## 2020-07-04 ENCOUNTER — Telehealth: Payer: Self-pay

## 2020-07-04 NOTE — Telephone Encounter (Signed)
Have tried calling patient multiple time, no answer and voice mail is full. Will try again later

## 2020-08-09 ENCOUNTER — Other Ambulatory Visit: Payer: Self-pay | Admitting: Internal Medicine

## 2020-08-12 ENCOUNTER — Telehealth: Payer: Self-pay

## 2020-08-12 NOTE — Telephone Encounter (Signed)
Called patient to schedule overdue appointment with office. Will send in 30 day supply of ART. Additional refills will be given at appointment.  Left message requesting patient call office back at his earliest convince.

## 2020-08-20 ENCOUNTER — Other Ambulatory Visit: Payer: Self-pay

## 2020-08-20 ENCOUNTER — Ambulatory Visit (INDEPENDENT_AMBULATORY_CARE_PROVIDER_SITE_OTHER): Payer: Self-pay

## 2020-08-20 ENCOUNTER — Ambulatory Visit: Payer: Self-pay

## 2020-08-20 ENCOUNTER — Other Ambulatory Visit: Payer: Self-pay | Admitting: *Deleted

## 2020-08-20 DIAGNOSIS — Z113 Encounter for screening for infections with a predominantly sexual mode of transmission: Secondary | ICD-10-CM

## 2020-08-20 DIAGNOSIS — B20 Human immunodeficiency virus [HIV] disease: Secondary | ICD-10-CM

## 2020-08-20 DIAGNOSIS — Z79899 Other long term (current) drug therapy: Secondary | ICD-10-CM

## 2020-08-20 DIAGNOSIS — Z23 Encounter for immunization: Secondary | ICD-10-CM

## 2020-08-20 NOTE — Progress Notes (Signed)
Patient here for lab prior to office visit with Dr Baxter Flattery. He met with Timmothy Sours to renew his ADAP.  He met with Denton Ar with [name], she is helping him apply for orange card so he can connect with neurology.  Patient has 2 days left of biktarvy, RN got 2 weeks of samples from pharmacy.  Patient sleeps on a walled porch. He goes to [facility] or other shelters when it is cold or there is a white flag night.  RN gave him hat/scarf/socks, and jacket from donations. Encouraged him to keep appointment with Dr Baxter Flattery in 2 weeks. He is coming back with [facility] in 1 week. Landis Gandy, RN

## 2020-08-21 ENCOUNTER — Encounter: Payer: Self-pay | Admitting: Internal Medicine

## 2020-08-21 LAB — T-HELPER CELL (CD4) - (RCID CLINIC ONLY)
CD4 % Helper T Cell: 38 % (ref 33–65)
CD4 T Cell Abs: 1082 /uL (ref 400–1790)

## 2020-08-21 LAB — URINE CYTOLOGY ANCILLARY ONLY
Chlamydia: NEGATIVE
Comment: NEGATIVE
Comment: NORMAL
Neisseria Gonorrhea: NEGATIVE

## 2020-08-23 LAB — COMPLETE METABOLIC PANEL WITH GFR
AG Ratio: 1.6 (calc) (ref 1.0–2.5)
ALT: 15 U/L (ref 9–46)
AST: 18 U/L (ref 10–40)
Albumin: 4.5 g/dL (ref 3.6–5.1)
Alkaline phosphatase (APISO): 68 U/L (ref 36–130)
BUN: 12 mg/dL (ref 7–25)
CO2: 27 mmol/L (ref 20–32)
Calcium: 9.8 mg/dL (ref 8.6–10.3)
Chloride: 101 mmol/L (ref 98–110)
Creat: 0.64 mg/dL (ref 0.60–1.35)
GFR, Est African American: 147 mL/min/{1.73_m2} (ref >=60)
GFR, Est Non African American: 127 mL/min/{1.73_m2} (ref >=60)
Globulin: 2.9 g/dL (calc) (ref 1.9–3.7)
Glucose, Bld: 119 mg/dL — ABNORMAL HIGH (ref 65–99)
Potassium: 3.9 mmol/L (ref 3.5–5.3)
Sodium: 137 mmol/L (ref 135–146)
Total Bilirubin: 0.4 mg/dL (ref 0.2–1.2)
Total Protein: 7.4 g/dL (ref 6.1–8.1)

## 2020-08-23 LAB — CBC WITH DIFFERENTIAL/PLATELET
Absolute Monocytes: 499 cells/uL (ref 200–950)
Basophils Absolute: 81 cells/uL (ref 0–200)
Basophils Relative: 0.7 %
Eosinophils Absolute: 139 cells/uL (ref 15–500)
Eosinophils Relative: 1.2 %
HCT: 48.4 % (ref 38.5–50.0)
Hemoglobin: 16 g/dL (ref 13.2–17.1)
Lymphs Abs: 3144 cells/uL (ref 850–3900)
MCH: 28.6 pg (ref 27.0–33.0)
MCHC: 33.1 g/dL (ref 32.0–36.0)
MCV: 86.4 fL (ref 80.0–100.0)
MPV: 9.9 fL (ref 7.5–12.5)
Monocytes Relative: 4.3 %
Neutro Abs: 7737 cells/uL (ref 1500–7800)
Neutrophils Relative %: 66.7 %
Platelets: 370 10*3/uL (ref 140–400)
RBC: 5.6 10*6/uL (ref 4.20–5.80)
RDW: 12.9 % (ref 11.0–15.0)
Total Lymphocyte: 27.1 %
WBC: 11.6 10*3/uL — ABNORMAL HIGH (ref 3.8–10.8)

## 2020-08-23 LAB — LIPID PANEL
Cholesterol: 193 mg/dL
HDL: 76 mg/dL
LDL Cholesterol (Calc): 98 mg/dL
Non-HDL Cholesterol (Calc): 117 mg/dL
Total CHOL/HDL Ratio: 2.5 (calc)
Triglycerides: 95 mg/dL

## 2020-08-23 LAB — HIV-1 RNA QUANT-NO REFLEX-BLD
HIV 1 RNA Quant: <20 Copies/mL
HIV-1 RNA Quant, Log: <1.3 Log cps/mL

## 2020-08-23 LAB — SYPHILIS: RPR W/REFLEX TO RPR TITER AND TREPONEMAL ANTIBODIES, TRADITIONAL SCREENING AND DIAGNOSIS ALGORITHM: RPR Ser Ql: NONREACTIVE

## 2020-08-29 ENCOUNTER — Other Ambulatory Visit: Payer: Self-pay | Admitting: Internal Medicine

## 2020-09-03 ENCOUNTER — Ambulatory Visit: Payer: Self-pay | Admitting: Internal Medicine

## 2020-10-04 ENCOUNTER — Other Ambulatory Visit: Payer: Self-pay

## 2020-10-04 ENCOUNTER — Ambulatory Visit: Payer: Self-pay

## 2020-10-07 ENCOUNTER — Encounter: Payer: Self-pay | Admitting: Internal Medicine

## 2020-10-08 ENCOUNTER — Other Ambulatory Visit: Payer: Self-pay

## 2020-10-08 ENCOUNTER — Encounter: Payer: Self-pay | Admitting: Internal Medicine

## 2020-10-08 ENCOUNTER — Ambulatory Visit (INDEPENDENT_AMBULATORY_CARE_PROVIDER_SITE_OTHER): Payer: Self-pay | Admitting: Internal Medicine

## 2020-10-08 VITALS — BP 110/70 | HR 96 | Temp 98.0°F | Ht 67.0 in | Wt 161.0 lb

## 2020-10-08 DIAGNOSIS — Z79899 Other long term (current) drug therapy: Secondary | ICD-10-CM

## 2020-10-08 DIAGNOSIS — B20 Human immunodeficiency virus [HIV] disease: Secondary | ICD-10-CM

## 2020-10-08 DIAGNOSIS — F259 Schizoaffective disorder, unspecified: Secondary | ICD-10-CM

## 2020-10-08 MED ORDER — BIKTARVY 50-200-25 MG PO TABS: 1.0000 | ORAL_TABLET | Freq: Every day | ORAL | 11 refills | Status: AC

## 2020-10-08 MED ORDER — TRAZODONE HCL 100 MG PO TABS: 100.0000 mg | ORAL_TABLET | Freq: Every evening | ORAL | 3 refills | Status: AC | PRN

## 2020-10-08 MED ORDER — GABAPENTIN 300 MG PO CAPS: 300.0000 mg | ORAL_CAPSULE | Freq: Three times a day (TID) | ORAL | 3 refills | Status: AC

## 2020-10-08 MED ORDER — CITALOPRAM HYDROBROMIDE 10 MG PO TABS: 10.0000 mg | ORAL_TABLET | Freq: Every day | ORAL | 3 refills | Status: AC

## 2020-10-08 MED ORDER — BUSPIRONE HCL 5 MG PO TABS: 5.0000 mg | ORAL_TABLET | Freq: Every day | ORAL | 3 refills | Status: AC

## 2020-10-08 MED ORDER — RISPERIDONE 1 MG PO TABS: 1.0000 mg | ORAL_TABLET | Freq: Every day | ORAL | 3 refills | Status: AC

## 2020-10-08 NOTE — Progress Notes (Signed)
Patient ID: Edwin Martinez, male   DOB: 06/26/1986, 35 y.o.   MRN: [id number]  HPI Edwin Martinez is a 35yo M with hiv disease, schizoaffective disorder, Cd 4 count and viral load well controlled at end of november Has been out of his psych medications  No buspar, no trazodone, risperdal. neurontin -- has been off meds November/december He notices not sleeping well since being of of his medications  Outpatient Encounter Medications as of 10/08/2020  Medication Sig  . BIKTARVY 50-200-25 MG TABS tablet TAKE 1 TABLET BY MOUTH EVERY DAY  . busPIRone (BUSPAR) 5 MG tablet TAKE 1 TABLET BY MOUTH DAILY  . citalopram (CELEXA) 10 MG tablet Take 1 tablet (10 mg total) by mouth daily.  Marland Kitchen gabapentin (NEURONTIN) 300 MG capsule TAKE 1 CAPSULE BY MOUTH THREE TIMES DAILY  . Ledipasvir-Sofosbuvir (HARVONI) 90-400 MG TABS Take 1 tablet by mouth daily.  . nicotine (NICODERM CQ - DOSED IN MG/24 HOURS) 21 mg/24hr patch Place 1 patch (21 mg total) onto the skin daily.  . risperiDONE (RISPERDAL) 1 MG tablet Take 1 tablet (1 mg total) by mouth at bedtime.  . traZODone (DESYREL) 100 MG tablet TAKE 1 TABLET BY MOUTH AT BEDTIME AS NEEDED FOR SLEEP   No facility-administered encounter medications on file as of 10/08/2020.     Patient Active Problem List   Diagnosis Date Noted  . Cocaine dependence with cocaine-induced mood disorder ([facility])   . MDD (major depressive disorder), recurrent severe, without psychosis ([facility]) 04/04/2019  . 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 Influenza A 10/11/2018  . MDD (major depressive disorder), severe ([facility]) 10/06/2018  . Diarrhea due to protozoan   . Giardial enteritis   . Norovirus   . MDD (major depressive disorder) 02/26/2018  . Suicide attempt ([facility])   . Depression 11/08/2017  . Chest tightness   . Cough   . Leukocytosis   . SOB (shortness of breath)   . Nausea vomiting and diarrhea   . Asthma exacerbation 08/15/2017  . Elevated LFTs 08/12/2017  . Cocaine  abuse with cocaine-induced mood disorder ([facility]) 03/30/2017  . Herpes zoster 03/06/2017  . Polysubstance abuse ([facility]) 03/06/2017  . Homelessness 03/06/2017  . Schizoaffective disorder ([facility]) 12/23/2016  . Suicidal ideation   . Cannabis use disorder, moderate, dependence ([facility]) 07/27/2016  . Tobacco user 07/27/2016  . Intentional drug overdose ([facility]) 07/18/2016  . Asthma 05/20/2007  . HIV (human immunodeficiency virus infection) ([facility]) 05/05/2007     Health Maintenance Due  Topic Date Due  . COVID-19 Vaccine (3 - Pfizer risk 4-dose series) 08/14/2020     Review of Systems 12 point ros is negative except what is mentioned above Physical Exam   BP 110/70   Pulse 96   Temp 98 F (36.7 C)   Ht 5\' 7"  (1.702 m)   Wt 161 lb (73 kg)   BMI 25.22 kg/m   Physical Exam  Constitutional: He is oriented to person, place, and time. He appears well-developed and well-nourished. Slightly disheveled.No distress.  HENT:  Mouth/Throat: Oropharynx is clear and moist. No oropharyngeal exudate.  Cardiovascular: Normal rate, regular rhythm and normal heart sounds. Exam reveals no gallop and no friction rub.  No murmur heard.  Pulmonary/Chest: Effort normal and breath sounds normal. No respiratory distress. He has no wheezes.  Abdominal: Soft. Bowel sounds are normal. He exhibits no distension. There is no tenderness.  Lymphadenopathy:  He has no cervical adenopathy.  Neurological: He is alert and oriented to person, place, and  time.  Skin: Skin is warm and dry. No rash noted. No erythema.  Psychiatric: He has a normal mood and affect. His behavior is normal.    Lab Results  Component Value Date   CD4TCELL 38 08/20/2020   Lab Results  Component Value Date   CD4TABS 1,082 08/20/2020   CD4TABS 1,524 10/12/2019   CD4TABS 1,681 06/08/2019   Lab Results  Component Value Date   HIV1RNAQUANT <20 08/20/2020   Lab Results  Component Value Date   HEPBSAB REACTIVE (A) 09/06/2012   Lab Results   Component Value Date   LABRPR NON-REACTIVE 08/20/2020    CBC Lab Results  Component Value Date   WBC 11.6 (H) 08/20/2020   RBC 5.60 08/20/2020   HGB 16.0 08/20/2020   HCT 48.4 08/20/2020   PLT 370 08/20/2020   MCV 86.4 08/20/2020   MCH 28.6 08/20/2020   MCHC 33.1 08/20/2020   RDW 12.9 08/20/2020   LYMPHSABS 3,144 08/20/2020   MONOABS 0.5 12/27/2018   EOSABS 139 08/20/2020    BMET Lab Results  Component Value Date   NA 137 08/20/2020   K 3.9 08/20/2020   CL 101 08/20/2020   CO2 27 08/20/2020   GLUCOSE 119 (H) 08/20/2020   BUN 12 08/20/2020   CREATININE 0.64 08/20/2020   CALCIUM 9.8 08/20/2020   GFRNONAA 127 08/20/2020   GFRAA 147 08/20/2020      Assessment and Plan hiv disease = continue on biktarvy. Will check labs  Long term medication = will check cr to see that it is still stable  Schizoaffective disorder= re-engage with seeing psychiatry and will refill his medication.

## 2020-10-20 ENCOUNTER — Other Ambulatory Visit: Payer: Self-pay | Admitting: Internal Medicine

## 2021-02-05 ENCOUNTER — Other Ambulatory Visit: Payer: Self-pay

## 2021-02-05 ENCOUNTER — Other Ambulatory Visit: Payer: Self-pay | Admitting: *Deleted

## 2021-02-05 ENCOUNTER — Ambulatory Visit (INDEPENDENT_AMBULATORY_CARE_PROVIDER_SITE_OTHER): Payer: Self-pay | Admitting: Internal Medicine

## 2021-02-05 ENCOUNTER — Encounter: Payer: Self-pay | Admitting: Internal Medicine

## 2021-02-05 VITALS — BP 121/67 | HR 91 | Temp 98.4°F

## 2021-02-05 DIAGNOSIS — B20 Human immunodeficiency virus [HIV] disease: Secondary | ICD-10-CM

## 2021-02-05 DIAGNOSIS — Z79899 Other long term (current) drug therapy: Secondary | ICD-10-CM

## 2021-02-05 DIAGNOSIS — Z113 Encounter for screening for infections with a predominantly sexual mode of transmission: Secondary | ICD-10-CM

## 2021-02-05 DIAGNOSIS — G259 Extrapyramidal and movement disorder, unspecified: Secondary | ICD-10-CM

## 2021-02-05 NOTE — Progress Notes (Signed)
Patient ID: Edwin Martinez, male   DOB: 04/28/1986, 35 y.o.   MRN: [id number]  HPI Edwin Martinez is a 35yo M with HIV disease, CD 4 count of 1082/VL<20, on biktarvy. He still remains homeless, sleeping on the porch. He states he has been taking his medications regularly. No change in any of his medications. He has not seen psychiatry nor was he able to keep neurology appt. He feels that his involuntary arm/hand/neck/tongue movements are worsening  He is concerned that  "I have huntington's disease"-  Family hx:  his father and few other aunts (2nd degree) have huntington's disease. Father died at 2  ROS: also has Burning discomfort to his penis and scrotum. No unprotected sex, no penile discharge. No lesions on penis that have erupted  Outpatient Encounter Medications as of 02/05/2021  Medication Sig  . bictegravir-emtricitabine-tenofovir AF (BIKTARVY) 50-200-25 MG TABS tablet Take 1 tablet by mouth daily.  . busPIRone (BUSPAR) 5 MG tablet Take 1 tablet (5 mg total) by mouth daily.  . citalopram (CELEXA) 10 MG tablet Take 1 tablet (10 mg total) by mouth daily.  Marland Kitchen gabapentin (NEURONTIN) 300 MG capsule Take 1 capsule (300 mg total) by mouth 3 (three) times daily.  . nicotine (NICODERM CQ - DOSED IN MG/24 HOURS) 21 mg/24hr patch Place 1 patch (21 mg total) onto the skin daily.  . risperiDONE (RISPERDAL) 1 MG tablet Take 1 tablet (1 mg total) by mouth at bedtime.  . traZODone (DESYREL) 100 MG tablet Take 1 tablet (100 mg total) by mouth at bedtime as needed. for sleep   No facility-administered encounter medications on file as of 02/05/2021.     Patient Active Problem List   Diagnosis Date Noted  . Cocaine dependence with cocaine-induced mood disorder ([facility])   . MDD (major depressive disorder), recurrent severe, without psychosis ([facility]) 04/04/2019  . 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 Influenza A 10/11/2018  . MDD (major depressive disorder), severe ([facility]) 10/06/2018  .  Diarrhea due to protozoan   . Giardial enteritis   . Norovirus   . MDD (major depressive disorder) 02/26/2018  . Suicide attempt ([facility])   . Depression 11/08/2017  . Chest tightness   . Cough   . Leukocytosis   . SOB (shortness of breath)   . Nausea vomiting and diarrhea   . Asthma exacerbation 08/15/2017  . Elevated LFTs 08/12/2017  . Cocaine abuse with cocaine-induced mood disorder ([facility]) 03/30/2017  . Herpes zoster 03/06/2017  . Polysubstance abuse ([facility]) 03/06/2017  . Homelessness 03/06/2017  . Schizoaffective disorder ([facility]) 12/23/2016  . Suicidal ideation   . Cannabis use disorder, moderate, dependence ([facility]) 07/27/2016  . Tobacco user 07/27/2016  . Intentional drug overdose ([facility]) 07/18/2016  . Asthma 05/20/2007  . HIV (human immunodeficiency virus infection) ([facility]) 05/05/2007     Health Maintenance Due  Topic Date Due  . COVID-19 Vaccine (3 - Pfizer risk 4-dose series) 08/14/2020    Review of Systems  Constitutional: Negative for fever, chills, diaphoresis, activity change, appetite change, fatigue and unexpected weight change.  HENT: Negative for congestion, sore throat, rhinorrhea, sneezing, trouble swallowing and sinus pressure.  Eyes: Negative for photophobia and visual disturbance.  Respiratory: Negative for cough, chest tightness, shortness of breath, wheezing and stridor.  Cardiovascular: Negative for chest pain, palpitations and leg swelling.  Gastrointestinal: Negative for nausea, vomiting, abdominal pain, diarrhea, constipation, blood in stool, abdominal distention and anal bleeding.  Genitourinary: Negative for dysuria, hematuria, flank pain and difficulty urinating.  Musculoskeletal: Negative  for myalgias, back pain, joint swelling, arthralgias and gait problem.  Skin: Negative for color change, pallor, rash and wound.  Neurological: Negative for dizziness, tremors, weakness and light-headedness.  Hematological: Negative for adenopathy. Does not bruise/bleed  easily.  Psychiatric/Behavioral: Negative for behavioral problems, confusion, sleep disturbance, dysphoric mood, decreased concentration and agitation.   Physical Exam  BP 121/67   Pulse 91   Temp 98.4 F (36.9 C) (Oral)   SpO2 97%  Physical Exam  Constitutional: He is oriented to person, place, and time. He appears well-developed and well-nourished. No distress.  HENT:  Mouth/Throat: Oropharynx is clear and moist. No oropharyngeal exudate.  Cardiovascular: Normal rate, regular rhythm and normal heart sounds. Exam reveals no gallop and no friction rub.  No murmur heard.  Pulmonary/Chest: Effort normal and breath sounds normal. No respiratory distress. He has no wheezes.  Abdominal: Soft. Bowel sounds are normal. He exhibits no distension. There is no tenderness.  Lymphadenopathy:  He has no cervical adenopathy.  GU= ashy appear shaft, no lesions. Some scaly appearance and small fissure to scrotum. Neurological: He is alert and oriented to person, place, and time.  Skin: Skin is warm and dry. No rash noted. No erythema.  Psychiatric: He has a normal mood and affect. His behavior is normal.    Lab Results  Component Value Date   CD4TCELL 38 08/20/2020   Lab Results  Component Value Date   CD4TABS 1,082 08/20/2020   CD4TABS 1,524 10/12/2019   CD4TABS 1,681 06/08/2019   Lab Results  Component Value Date   HIV1RNAQUANT <20 08/20/2020   Lab Results  Component Value Date   HEPBSAB REACTIVE (A) 09/06/2012   Lab Results  Component Value Date   LABRPR NON-REACTIVE 08/20/2020    CBC Lab Results  Component Value Date   WBC 11.6 (H) 08/20/2020   RBC 5.60 08/20/2020   HGB 16.0 08/20/2020   HCT 48.4 08/20/2020   PLT 370 08/20/2020   MCV 86.4 08/20/2020   MCH 28.6 08/20/2020   MCHC 33.1 08/20/2020   RDW 12.9 08/20/2020   LYMPHSABS 3,144 08/20/2020   MONOABS 0.5 12/27/2018   EOSABS 139 08/20/2020    BMET Lab Results  Component Value Date   NA 137 08/20/2020   K 3.9  08/20/2020   CL 101 08/20/2020   CO2 27 08/20/2020   GLUCOSE 119 (H) 08/20/2020   BUN 12 08/20/2020   CREATININE 0.64 08/20/2020   CALCIUM 9.8 08/20/2020   GFRNONAA 127 08/20/2020   GFRAA 147 08/20/2020      Assessment and Plan  Chorea movement =  Will reach out to [facility] neurology to see if can be seen quickly. For diagnosis (is this huntington's vs other movement disorder plus treatment options Unclear if his current psych meds would contribute to this presentation  Schizoaffective Disorder, Bipolar type, Depressed, = continue on current regimen, appears stable. Will see if can re-establish care for psychiatric services.   hiv disease= will check labs and continue biktarvy  Penile eczema = will give samples of clotrimazole plus hydrocortisone bid for a week to see if improvement

## 2021-02-06 ENCOUNTER — Encounter: Payer: Self-pay | Admitting: Neurology

## 2021-02-06 LAB — T-HELPER CELL (CD4) - (RCID CLINIC ONLY)
CD4 % Helper T Cell: 51 % (ref 33–65)
CD4 T Cell Abs: 1044 /uL (ref 400–1790)

## 2021-02-07 LAB — URINE CYTOLOGY ANCILLARY ONLY
Chlamydia: NEGATIVE
Comment: NEGATIVE
Comment: NORMAL
Neisseria Gonorrhea: NEGATIVE

## 2021-02-09 LAB — CBC WITH DIFFERENTIAL/PLATELET
Absolute Monocytes: 1080 cells/uL — ABNORMAL HIGH (ref 200–950)
Basophils Absolute: 113 cells/uL (ref 0–200)
Basophils Relative: 0.5 %
Eosinophils Absolute: 180 cells/uL (ref 15–500)
Eosinophils Relative: 0.8 %
HCT: 39.8 % (ref 38.5–50.0)
Hemoglobin: 13.7 g/dL (ref 13.2–17.1)
Lymphs Abs: 2273 cells/uL (ref 850–3900)
MCH: 29.7 pg (ref 27.0–33.0)
MCHC: 34.4 g/dL (ref 32.0–36.0)
MCV: 86.1 fL (ref 80.0–100.0)
MPV: 10.5 fL (ref 7.5–12.5)
Monocytes Relative: 4.8 %
Neutro Abs: 18855 cells/uL — ABNORMAL HIGH (ref 1500–7800)
Neutrophils Relative %: 83.8 %
Platelets: 213 10*3/uL (ref 140–400)
RBC: 4.62 10*6/uL (ref 4.20–5.80)
RDW: 13.1 % (ref 11.0–15.0)
Total Lymphocyte: 10.1 %
WBC: 22.5 10*3/uL — ABNORMAL HIGH (ref 3.8–10.8)

## 2021-02-09 LAB — COMPLETE METABOLIC PANEL WITH GFR
AG Ratio: 1.9 (calc) (ref 1.0–2.5)
ALT: 36 U/L (ref 9–46)
AST: 36 U/L (ref 10–40)
Albumin: 4.1 g/dL (ref 3.6–5.1)
Alkaline phosphatase (APISO): 66 U/L (ref 36–130)
BUN: 21 mg/dL (ref 7–25)
CO2: 25 mmol/L (ref 20–32)
Calcium: 8.6 mg/dL (ref 8.6–10.3)
Chloride: 108 mmol/L (ref 98–110)
Creat: 1.13 mg/dL (ref 0.60–1.35)
GFR, Est African American: 97 mL/min/{1.73_m2} (ref >=60)
GFR, Est Non African American: 84 mL/min/{1.73_m2} (ref >=60)
Globulin: 2.2 g/dL (calc) (ref 1.9–3.7)
Glucose, Bld: 91 mg/dL (ref 65–99)
Potassium: 3.8 mmol/L (ref 3.5–5.3)
Sodium: 143 mmol/L (ref 135–146)
Total Bilirubin: 0.3 mg/dL (ref 0.2–1.2)
Total Protein: 6.3 g/dL (ref 6.1–8.1)

## 2021-02-09 LAB — HIV-1 RNA QUANT-NO REFLEX-BLD
HIV 1 RNA Quant: <20 Copies/mL — ABNORMAL HIGH
HIV-1 RNA Quant, Log: <1.3 Log cps/mL — ABNORMAL HIGH

## 2021-02-09 LAB — RPR: RPR Ser Ql: NONREACTIVE

## 2021-02-13 NOTE — Progress Notes (Deleted)
Assessment/Plan:   ***abnormal movements  -Patient does have abnormal movements, and has a family history of Huntington's disease.  However, he is also on risperidone, which can produce the same movements.  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  Subjective:   Edwin Martinez was seen today in the movement disorders clinic for neurologic consultation at the request of Judyann Munson, MD.  The consultation is for the evaluation of abnormal movements of the arm, hand, tongue.  Patient is concerned about Huntington's disease as his father died at 32 from the disease.  I spoke with Dr. [name] personally about the case.  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  Patient is on risperidone.  Tremor: {yes no:314532}   How long has it been going on? ***  At rest or with activation?  ***  When is it noted the most?  ***  Fam hx of tremor?  {yes TO:671245}  Located where?  ***  Affected by caffeine:  {yes no:314532}  Affected by alcohol:  {yes no:314532}  Affected by stress:  {yes no:314532}  Affected by fatigue:  {yes no:314532}  Spills soup if on spoon:  {yes no:314532}  Spills glass of liquid if full:  {yes no:314532}  Affects ADL's (tying shoes, brushing teeth, etc):  {yes no:314532}  Tremor inducing meds:  Yes.  , risperidone  Other Specific Symptoms:  Voice: *** Sleep: ***  Vivid Dreams:  {yes no:314532}  Acting out dreams:  {yes no:314532} Wet Pillows: {yes no:314532} Postural symptoms:  {yes no:314532}  Falls?  {yes no:314532} Bradykinesia symptoms: {parkinson brady:18041} Loss of smell:  {yes no:314532} Loss of taste:  {yes no:314532} Urinary Incontinence:  {yes no:314532} Difficulty Swallowing:  {yes no:314532} Handwriting, micrographia: {yes no:314532} Trouble with ADL's:  {yes no:314532}  Trouble buttoning clothing: {yes  no:314532} Depression:  {yes no:314532} Memory changes:  {yes no:314532} Hallucinations:  {yes no:314532}  visual distortions: {yes no:314532} N/V:  {yes no:314532} Lightheaded:  {yes no:314532}  Syncope: {yes no:314532} Diplopia:  {yes no:314532} Dyskinesia:  {yes no:314532}  Last neuroimaging of the brain was in September, 2020.  This was a CT brain.  I personally reviewed that.  It was unremarkable, with the exception of mild atrophy.  PREVIOUS MEDICATIONS: {Parkinson's RX:18200}  ALLERGIES:   Allergies  Allergen Reactions  . Magnesium-Containing Compounds Other (See Comments)    This medication is contraindicated with pts HIV meds.    . Peanut-Containing Drug Products Anaphylaxis  . Esomeprazole Magnesium Cough  . Atripla [Efavirenz-Emtricitab-Tenofovir] Other (See Comments)    Reaction:  Suicidal thoughts   . Bactrim [Sulfamethoxazole-Trimethoprim]   . Bactrim [Sulfamethoxazole-Trimethoprim] Rash  . Penicillins Rash and Other (See Comments)    Has patient had a PCN reaction causing immediate rash, facial/tongue/throat swelling, SOB or lightheadedness with hypotension: Yes Has patient had a PCN reaction causing severe rash involving mucus membranes or skin necrosis: No Has patient had a PCN reaction that required hospitalization No Has patient had a PCN reaction occurring within the last 10 years: No If all of the above answers are "NO", then may proceed with Cephalosporin use.    CURRENT MEDICATIONS:  Current Outpatient Medications  Medication Instructions  . bictegravir-emtricitabine-tenofovir AF (BIKTARVY) 50-200-25 MG TABS tablet 1 tablet, Oral, Daily  . busPIRone (BUSPAR) 5 mg, Oral, Daily  . citalopram (CELEXA) 10 mg, Oral, Daily  . gabapentin (NEURONTIN)  300 mg, Oral, 3 times daily  . nicotine (NICODERM CQ - DOSED IN MG/24 HOURS) 21 mg, Transdermal, Daily  . risperiDONE (RISPERDAL) 1 mg, Oral, Daily at bedtime  . traZODone (DESYREL) 100 mg, Oral, At bedtime PRN,  for sleep    Objective:   PHYSICAL EXAMINATION:    VITALS:  There were no vitals filed for this visit.  GEN:  The patient appears stated age and is in NAD. HEENT:  Normocephalic, atraumatic.  The mucous membranes are moist. The superficial temporal arteries are without ropiness or tenderness. CV:  RRR Lungs:  CTAB Neck/HEME:  There are no carotid bruits bilaterally.  Neurological examination:  Orientation: The patient is alert and oriented x3.  Cranial nerves: There is good facial symmetry.  Extraocular muscles are intact. The visual fields are full to confrontational testing. The speech is fluent and clear. Soft palate rises symmetrically and there is no tongue deviation. Hearing is intact to conversational tone. Sensation: Sensation is intact to light touch throughout (facial, trunk, extremities). Vibration is intact at the bilateral big toe. There is no extinction with double simultaneous stimulation.  Motor: Strength is 5/5 in the bilateral upper and lower extremities.   Shoulder shrug is equal and symmetric.  There is no pronator drift. Deep tendon reflexes: Deep tendon reflexes are 2/4 at the bilateral biceps, triceps, brachioradialis, patella and achilles. Plantar responses are downgoing bilaterally.  Movement examination: Tone: There is ***tone in the bilateral upper extremities.  The tone in the lower extremities is ***.  Abnormal movements: *** Coordination:  There is *** decremation with RAM's, *** Gait and Station: The patient has *** difficulty arising out of a deep-seated chair without the use of the hands. The patient's stride length is ***.  The patient has a *** pull test.     I have reviewed and interpreted the following labs independently   Chemistry      Component Value Date/Time   NA 143 02/05/2021 1710   K 3.8 02/05/2021 1710   CL 108 02/05/2021 1710   CO2 25 02/05/2021 1710   BUN 21 02/05/2021 1710   CREATININE 1.13 02/05/2021 1710      Component Value  Date/Time   CALCIUM 8.6 02/05/2021 1710   ALKPHOS 121 12/06/2019 0437   AST 36 02/05/2021 1710   ALT 36 02/05/2021 1710   ALT 83 (H) 03/22/2019 1155   BILITOT 0.3 02/05/2021 1710      Lab Results  Component Value Date   TSH 0.686 02/27/2018   Lab Results  Component Value Date   WBC 22.5 (H) 02/05/2021   HGB 13.7 02/05/2021   HCT 39.8 02/05/2021   MCV 86.1 02/05/2021   PLT 213 02/05/2021      Total time spent on today's visit was ***greater than 60 minutes, including both face-to-face time and nonface-to-face time.  Time included that spent on review of records (prior notes available to me/labs/imaging if pertinent), discussing treatment and goals, answering patient's questions and coordinating care.  Cc:  Judyann Munson, MD

## 2021-02-17 ENCOUNTER — Encounter: Payer: Self-pay | Admitting: Neurology

## 2021-02-26 DEATH — deceased

## 2021-03-10 ENCOUNTER — Ambulatory Visit: Payer: Self-pay | Admitting: Internal Medicine

## 2021-03-24 ENCOUNTER — Encounter: Payer: Self-pay | Admitting: Internal Medicine

## 2021-04-01 ENCOUNTER — Telehealth: Payer: Self-pay | Admitting: *Deleted

## 2021-04-01 NOTE — Telephone Encounter (Signed)
RN notified of patient's death by provider, Dr [name]. Printed demographic information to notify [facility], [name]. Andree Coss, RN
